# Patient Record
Sex: Female | Born: 1940 | Race: White | Hispanic: No | Smoking: Never smoker
Health system: Southern US, Community
[De-identification: ages and names within clinical notes are randomized; demographics above are authoritative.]

## PROBLEM LIST (undated history)

## (undated) DIAGNOSIS — Z8669 Personal history of other diseases of the nervous system and sense organs: Secondary | ICD-10-CM

## (undated) DIAGNOSIS — R233 Spontaneous ecchymoses: Secondary | ICD-10-CM

## (undated) DIAGNOSIS — I10 Essential (primary) hypertension: Secondary | ICD-10-CM

## (undated) DIAGNOSIS — I739 Peripheral vascular disease, unspecified: Secondary | ICD-10-CM

## (undated) DIAGNOSIS — Z7901 Long term (current) use of anticoagulants: Secondary | ICD-10-CM

## (undated) DIAGNOSIS — Z9889 Other specified postprocedural states: Secondary | ICD-10-CM

## (undated) DIAGNOSIS — O2230 Deep phlebothrombosis in pregnancy, unspecified trimester: Secondary | ICD-10-CM

## (undated) DIAGNOSIS — E7800 Pure hypercholesterolemia, unspecified: Secondary | ICD-10-CM

## (undated) DIAGNOSIS — Z86010 Personal history of colonic polyps: Secondary | ICD-10-CM

## (undated) DIAGNOSIS — K219 Gastro-esophageal reflux disease without esophagitis: Secondary | ICD-10-CM

## (undated) DIAGNOSIS — I82409 Acute embolism and thrombosis of unspecified deep veins of unspecified lower extremity: Secondary | ICD-10-CM

## (undated) DIAGNOSIS — M1712 Unilateral primary osteoarthritis, left knee: Secondary | ICD-10-CM

## (undated) DIAGNOSIS — R238 Other skin changes: Secondary | ICD-10-CM

## (undated) DIAGNOSIS — E7219 Other disorders of sulfur-bearing amino-acid metabolism: Principal | ICD-10-CM

## (undated) DIAGNOSIS — E538 Deficiency of other specified B group vitamins: Secondary | ICD-10-CM

## (undated) DIAGNOSIS — M1711 Unilateral primary osteoarthritis, right knee: Secondary | ICD-10-CM

## (undated) DIAGNOSIS — IMO0001 Reserved for inherently not codable concepts without codable children: Secondary | ICD-10-CM

## (undated) DIAGNOSIS — Z923 Personal history of irradiation: Secondary | ICD-10-CM

## (undated) DIAGNOSIS — K8020 Calculus of gallbladder without cholecystitis without obstruction: Secondary | ICD-10-CM

## (undated) DIAGNOSIS — R112 Nausea with vomiting, unspecified: Secondary | ICD-10-CM

## (undated) DIAGNOSIS — C55 Malignant neoplasm of uterus, part unspecified: Secondary | ICD-10-CM

## (undated) HISTORY — DX: Deficiency of other specified B group vitamins: E53.8

## (undated) HISTORY — DX: Calculus of gallbladder without cholecystitis without obstruction: K80.20

## (undated) HISTORY — DX: Unilateral primary osteoarthritis, right knee: M17.11

## (undated) HISTORY — PX: WISDOM TOOTH EXTRACTION: SHX21

## (undated) HISTORY — DX: Pure hypercholesterolemia, unspecified: E78.00

## (undated) HISTORY — DX: Deep phlebothrombosis in pregnancy, unspecified trimester: O22.30

## (undated) HISTORY — DX: Malignant neoplasm of uterus, part unspecified: C55

## (undated) HISTORY — PX: ABDOMINAL HYSTERECTOMY: SHX81

## (undated) HISTORY — DX: Essential (primary) hypertension: I10

## (undated) HISTORY — DX: Gastro-esophageal reflux disease without esophagitis: K21.9

## (undated) HISTORY — DX: Peripheral vascular disease, unspecified: I73.9

## (undated) HISTORY — DX: Other disorders of sulfur-bearing amino-acid metabolism: E72.19

---

## 1963-04-01 HISTORY — PX: APPENDECTOMY: SHX54

## 1985-03-31 HISTORY — PX: BREAST LUMPECTOMY: SHX2

## 1995-04-01 HISTORY — PX: ESOPHAGOGASTRODUODENOSCOPY: SHX1529

## 1995-04-01 HISTORY — PX: CHOLECYSTECTOMY: SHX55

## 1999-01-01 ENCOUNTER — Other Ambulatory Visit: Admission: RE | Admit: 1999-01-01 | Discharge: 1999-01-01 | Payer: Self-pay | Admitting: Obstetrics and Gynecology

## 1999-04-01 DIAGNOSIS — I82409 Acute embolism and thrombosis of unspecified deep veins of unspecified lower extremity: Secondary | ICD-10-CM

## 1999-04-01 DIAGNOSIS — Z7901 Long term (current) use of anticoagulants: Secondary | ICD-10-CM

## 1999-04-01 HISTORY — DX: Acute embolism and thrombosis of unspecified deep veins of unspecified lower extremity: I82.409

## 1999-04-01 HISTORY — DX: Long term (current) use of anticoagulants: Z79.01

## 1999-06-05 ENCOUNTER — Inpatient Hospital Stay (HOSPITAL_COMMUNITY): Admission: EM | Admit: 1999-06-05 | Discharge: 1999-06-07 | Payer: Self-pay | Admitting: Emergency Medicine

## 1999-06-05 ENCOUNTER — Ambulatory Visit (HOSPITAL_COMMUNITY): Admission: RE | Admit: 1999-06-05 | Discharge: 1999-06-05 | Payer: Self-pay | Admitting: Internal Medicine

## 1999-12-17 ENCOUNTER — Ambulatory Visit (HOSPITAL_COMMUNITY): Admission: RE | Admit: 1999-12-17 | Discharge: 1999-12-17 | Payer: Self-pay | Admitting: *Deleted

## 2000-07-07 ENCOUNTER — Emergency Department (HOSPITAL_COMMUNITY): Admission: EM | Admit: 2000-07-07 | Discharge: 2000-07-07 | Payer: Self-pay | Admitting: Emergency Medicine

## 2001-04-01 ENCOUNTER — Other Ambulatory Visit: Admission: RE | Admit: 2001-04-01 | Discharge: 2001-04-01 | Payer: Self-pay | Admitting: Obstetrics and Gynecology

## 2003-07-11 ENCOUNTER — Encounter (INDEPENDENT_AMBULATORY_CARE_PROVIDER_SITE_OTHER): Payer: Self-pay | Admitting: *Deleted

## 2004-02-01 ENCOUNTER — Ambulatory Visit: Payer: Self-pay | Admitting: Cardiology

## 2004-02-29 ENCOUNTER — Ambulatory Visit: Payer: Self-pay | Admitting: Internal Medicine

## 2004-03-18 ENCOUNTER — Ambulatory Visit: Payer: Self-pay | Admitting: Cardiovascular Disease

## 2004-04-15 ENCOUNTER — Ambulatory Visit: Payer: Self-pay | Admitting: *Deleted

## 2004-05-13 ENCOUNTER — Ambulatory Visit: Payer: Self-pay | Admitting: Cardiology

## 2004-05-21 ENCOUNTER — Ambulatory Visit: Payer: Self-pay | Admitting: Cardiology

## 2004-06-14 ENCOUNTER — Ambulatory Visit: Payer: Self-pay | Admitting: Cardiology

## 2004-07-05 ENCOUNTER — Ambulatory Visit: Payer: Self-pay | Admitting: Cardiology

## 2004-07-15 ENCOUNTER — Ambulatory Visit: Payer: Self-pay | Admitting: Internal Medicine

## 2004-08-07 ENCOUNTER — Ambulatory Visit: Payer: Self-pay | Admitting: *Deleted

## 2004-08-21 ENCOUNTER — Ambulatory Visit: Payer: Self-pay | Admitting: *Deleted

## 2004-08-29 ENCOUNTER — Ambulatory Visit: Payer: Self-pay | Admitting: Internal Medicine

## 2004-08-30 ENCOUNTER — Other Ambulatory Visit: Admission: RE | Admit: 2004-08-30 | Discharge: 2004-08-30 | Payer: Self-pay | Admitting: Family Medicine

## 2004-08-30 ENCOUNTER — Ambulatory Visit: Payer: Self-pay | Admitting: Family Medicine

## 2004-09-13 ENCOUNTER — Ambulatory Visit: Payer: Self-pay | Admitting: Cardiology

## 2004-09-26 ENCOUNTER — Ambulatory Visit: Payer: Self-pay | Admitting: Cardiology

## 2004-10-17 ENCOUNTER — Ambulatory Visit: Payer: Self-pay | Admitting: Cardiology

## 2004-11-14 ENCOUNTER — Ambulatory Visit: Payer: Self-pay | Admitting: Cardiology

## 2004-12-12 ENCOUNTER — Ambulatory Visit: Payer: Self-pay | Admitting: Internal Medicine

## 2004-12-23 ENCOUNTER — Ambulatory Visit: Payer: Self-pay | Admitting: Cardiology

## 2004-12-27 ENCOUNTER — Ambulatory Visit: Payer: Self-pay | Admitting: Internal Medicine

## 2005-01-06 ENCOUNTER — Ambulatory Visit: Payer: Self-pay | Admitting: Internal Medicine

## 2005-01-21 ENCOUNTER — Ambulatory Visit: Payer: Self-pay | Admitting: Cardiology

## 2005-02-05 ENCOUNTER — Ambulatory Visit: Payer: Self-pay | Admitting: Cardiology

## 2005-02-05 ENCOUNTER — Encounter: Admission: RE | Admit: 2005-02-05 | Discharge: 2005-03-30 | Payer: Self-pay | Admitting: Internal Medicine

## 2005-02-11 ENCOUNTER — Ambulatory Visit: Payer: Self-pay | Admitting: Internal Medicine

## 2005-02-19 ENCOUNTER — Ambulatory Visit: Payer: Self-pay | Admitting: Cardiology

## 2005-03-05 ENCOUNTER — Ambulatory Visit: Payer: Self-pay | Admitting: Cardiology

## 2005-03-26 ENCOUNTER — Ambulatory Visit: Payer: Self-pay | Admitting: Cardiology

## 2005-03-31 DIAGNOSIS — Z860100 Personal history of colon polyps, unspecified: Secondary | ICD-10-CM

## 2005-03-31 HISTORY — DX: Personal history of colon polyps, unspecified: Z86.0100

## 2005-03-31 HISTORY — DX: Personal history of colonic polyps: Z86.010

## 2005-03-31 HISTORY — PX: COLONOSCOPY W/ POLYPECTOMY: SHX1380

## 2005-04-23 ENCOUNTER — Ambulatory Visit: Payer: Self-pay | Admitting: Cardiology

## 2005-05-21 ENCOUNTER — Ambulatory Visit: Payer: Self-pay | Admitting: Cardiology

## 2005-06-18 ENCOUNTER — Ambulatory Visit: Payer: Self-pay | Admitting: Cardiology

## 2005-07-16 ENCOUNTER — Ambulatory Visit: Payer: Self-pay | Admitting: *Deleted

## 2005-08-13 ENCOUNTER — Ambulatory Visit: Payer: Self-pay | Admitting: Cardiology

## 2005-08-19 ENCOUNTER — Ambulatory Visit: Payer: Self-pay | Admitting: Internal Medicine

## 2005-09-10 ENCOUNTER — Ambulatory Visit: Payer: Self-pay | Admitting: Cardiology

## 2005-10-02 ENCOUNTER — Ambulatory Visit: Payer: Self-pay | Admitting: Internal Medicine

## 2005-10-08 ENCOUNTER — Ambulatory Visit: Payer: Self-pay | Admitting: Cardiovascular Disease

## 2005-10-22 ENCOUNTER — Ambulatory Visit: Payer: Self-pay | Admitting: Cardiology

## 2005-11-12 ENCOUNTER — Ambulatory Visit: Payer: Self-pay | Admitting: Cardiology

## 2005-11-25 ENCOUNTER — Ambulatory Visit: Payer: Self-pay | Admitting: Internal Medicine

## 2005-12-10 ENCOUNTER — Ambulatory Visit: Payer: Self-pay | Admitting: Internal Medicine

## 2005-12-15 ENCOUNTER — Encounter: Payer: Self-pay | Admitting: Internal Medicine

## 2005-12-18 ENCOUNTER — Ambulatory Visit: Payer: Self-pay | Admitting: Internal Medicine

## 2005-12-24 ENCOUNTER — Ambulatory Visit: Payer: Self-pay | Admitting: Cardiology

## 2005-12-25 ENCOUNTER — Ambulatory Visit: Payer: Self-pay | Admitting: Internal Medicine

## 2006-01-07 ENCOUNTER — Ambulatory Visit: Payer: Self-pay | Admitting: Internal Medicine

## 2006-01-09 ENCOUNTER — Ambulatory Visit: Payer: Self-pay | Admitting: Cardiology

## 2006-02-06 ENCOUNTER — Ambulatory Visit: Payer: Self-pay | Admitting: Cardiology

## 2006-03-06 ENCOUNTER — Ambulatory Visit: Payer: Self-pay | Admitting: Cardiology

## 2006-03-25 ENCOUNTER — Ambulatory Visit: Payer: Self-pay | Admitting: Cardiology

## 2006-04-15 ENCOUNTER — Ambulatory Visit: Payer: Self-pay | Admitting: Cardiology

## 2006-05-12 ENCOUNTER — Ambulatory Visit: Payer: Self-pay | Admitting: Cardiology

## 2006-06-02 ENCOUNTER — Ambulatory Visit: Payer: Self-pay | Admitting: Internal Medicine

## 2006-06-23 ENCOUNTER — Ambulatory Visit: Payer: Self-pay | Admitting: Cardiology

## 2006-07-21 ENCOUNTER — Ambulatory Visit: Payer: Self-pay | Admitting: Cardiology

## 2006-07-23 DIAGNOSIS — Z86718 Personal history of other venous thrombosis and embolism: Secondary | ICD-10-CM | POA: Insufficient documentation

## 2006-07-23 DIAGNOSIS — K219 Gastro-esophageal reflux disease without esophagitis: Secondary | ICD-10-CM | POA: Insufficient documentation

## 2006-08-18 ENCOUNTER — Ambulatory Visit: Payer: Self-pay | Admitting: Cardiology

## 2006-09-03 ENCOUNTER — Ambulatory Visit: Payer: Self-pay | Admitting: Cardiology

## 2006-10-01 ENCOUNTER — Ambulatory Visit: Payer: Self-pay | Admitting: Internal Medicine

## 2006-10-19 ENCOUNTER — Ambulatory Visit: Payer: Self-pay | Admitting: Cardiology

## 2006-11-09 ENCOUNTER — Ambulatory Visit: Payer: Self-pay | Admitting: Internal Medicine

## 2006-11-24 ENCOUNTER — Ambulatory Visit: Payer: Self-pay | Admitting: Cardiology

## 2006-12-08 ENCOUNTER — Ambulatory Visit: Payer: Self-pay | Admitting: Cardiology

## 2006-12-29 ENCOUNTER — Ambulatory Visit: Payer: Self-pay | Admitting: Cardiology

## 2007-01-19 ENCOUNTER — Ambulatory Visit: Payer: Self-pay | Admitting: Cardiovascular Disease

## 2007-02-02 ENCOUNTER — Ambulatory Visit: Payer: Self-pay | Admitting: Cardiology

## 2007-02-23 ENCOUNTER — Ambulatory Visit: Payer: Self-pay | Admitting: Internal Medicine

## 2007-03-01 ENCOUNTER — Encounter: Payer: Self-pay | Admitting: Internal Medicine

## 2007-03-09 ENCOUNTER — Ambulatory Visit: Payer: Self-pay | Admitting: Cardiology

## 2007-03-18 ENCOUNTER — Ambulatory Visit: Payer: Self-pay | Admitting: Internal Medicine

## 2007-03-18 DIAGNOSIS — E8881 Metabolic syndrome: Secondary | ICD-10-CM | POA: Insufficient documentation

## 2007-03-18 DIAGNOSIS — I10 Essential (primary) hypertension: Secondary | ICD-10-CM | POA: Insufficient documentation

## 2007-03-18 DIAGNOSIS — E782 Mixed hyperlipidemia: Secondary | ICD-10-CM | POA: Insufficient documentation

## 2007-03-18 LAB — CONVERTED CEMR LAB
Cholesterol, target level: 200 mg/dL
HDL goal, serum: 40 mg/dL
LDL Goal: 160 mg/dL
Vit D, 1,25-Dihydroxy: 13 — ABNORMAL LOW (ref 30–89)

## 2007-03-29 ENCOUNTER — Encounter (INDEPENDENT_AMBULATORY_CARE_PROVIDER_SITE_OTHER): Payer: Self-pay | Admitting: *Deleted

## 2007-03-29 LAB — CONVERTED CEMR LAB
ALT: 16 units/L (ref 0–35)
AST: 20 units/L (ref 0–37)
Albumin: 3.9 g/dL (ref 3.5–5.2)
Alkaline Phosphatase: 96 units/L (ref 39–117)
BUN: 8 mg/dL (ref 6–23)
Bilirubin, Direct: 0.1 mg/dL (ref 0.0–0.3)
Cholesterol: 135 mg/dL (ref 0–200)
Creatinine, Ser: 0.9 mg/dL (ref 0.4–1.2)
HDL: 60.9 mg/dL (ref >39.0)
Hgb A1c MFr Bld: 5.4 % (ref 4.6–6.0)
LDL Cholesterol: 52 mg/dL (ref 0–99)
Potassium: 4.3 meq/L (ref 3.5–5.1)
Total Bilirubin: 0.8 mg/dL (ref 0.3–1.2)
Total CHOL/HDL Ratio: 2.2
Total Protein: 7.5 g/dL (ref 6.0–8.3)
Triglycerides: 111 mg/dL (ref 0–149)
VLDL: 22 mg/dL (ref 0–40)

## 2007-03-31 ENCOUNTER — Ambulatory Visit: Payer: Self-pay | Admitting: Cardiology

## 2007-04-02 ENCOUNTER — Telehealth (INDEPENDENT_AMBULATORY_CARE_PROVIDER_SITE_OTHER): Payer: Self-pay | Admitting: *Deleted

## 2007-04-15 ENCOUNTER — Ambulatory Visit: Payer: Self-pay | Admitting: Gastroenterology

## 2007-04-21 ENCOUNTER — Encounter: Payer: Self-pay | Admitting: Internal Medicine

## 2007-04-21 ENCOUNTER — Encounter: Payer: Self-pay | Admitting: Gastroenterology

## 2007-04-21 ENCOUNTER — Ambulatory Visit: Payer: Self-pay | Admitting: Gastroenterology

## 2007-04-30 ENCOUNTER — Ambulatory Visit: Payer: Self-pay | Admitting: Cardiology

## 2007-05-21 ENCOUNTER — Ambulatory Visit: Payer: Self-pay | Admitting: Cardiology

## 2007-06-18 ENCOUNTER — Ambulatory Visit: Payer: Self-pay | Admitting: Cardiology

## 2007-07-14 ENCOUNTER — Ambulatory Visit: Payer: Self-pay | Admitting: Internal Medicine

## 2007-08-04 ENCOUNTER — Ambulatory Visit: Payer: Self-pay | Admitting: Cardiology

## 2007-08-17 ENCOUNTER — Ambulatory Visit: Payer: Self-pay | Admitting: Cardiology

## 2007-08-18 ENCOUNTER — Ambulatory Visit: Payer: Self-pay | Admitting: Internal Medicine

## 2007-08-18 LAB — CONVERTED CEMR LAB
Cholesterol: 169 mg/dL (ref 0–200)
HDL: 58.5 mg/dL (ref >39.0)
LDL Cholesterol: 80 mg/dL (ref 0–99)
Total CHOL/HDL Ratio: 2.9
Triglycerides: 155 mg/dL — ABNORMAL HIGH (ref 0–149)
VLDL: 31 mg/dL (ref 0–40)

## 2007-08-19 ENCOUNTER — Encounter: Payer: Self-pay | Admitting: Internal Medicine

## 2007-08-20 ENCOUNTER — Encounter (INDEPENDENT_AMBULATORY_CARE_PROVIDER_SITE_OTHER): Payer: Self-pay | Admitting: *Deleted

## 2007-08-20 LAB — CONVERTED CEMR LAB: Vit D, 1,25-Dihydroxy: 26 — ABNORMAL LOW (ref 30–89)

## 2007-08-25 ENCOUNTER — Ambulatory Visit: Payer: Self-pay | Admitting: Internal Medicine

## 2007-08-25 DIAGNOSIS — E559 Vitamin D deficiency, unspecified: Secondary | ICD-10-CM | POA: Insufficient documentation

## 2007-08-25 LAB — CONVERTED CEMR LAB
Cholesterol, target level: 200 mg/dL
HDL goal, serum: 50 mg/dL
LDL Goal: 100 mg/dL

## 2007-09-07 ENCOUNTER — Ambulatory Visit: Payer: Self-pay | Admitting: Cardiovascular Disease

## 2007-10-05 ENCOUNTER — Ambulatory Visit: Payer: Self-pay | Admitting: Cardiovascular Disease

## 2007-10-14 ENCOUNTER — Other Ambulatory Visit: Admission: RE | Admit: 2007-10-14 | Discharge: 2007-10-14 | Payer: Self-pay | Admitting: Family Medicine

## 2007-10-14 ENCOUNTER — Ambulatory Visit: Payer: Self-pay | Admitting: Family Medicine

## 2007-10-14 ENCOUNTER — Encounter: Payer: Self-pay | Admitting: Family Medicine

## 2007-10-19 ENCOUNTER — Ambulatory Visit: Payer: Self-pay | Admitting: Cardiology

## 2007-10-20 ENCOUNTER — Encounter (INDEPENDENT_AMBULATORY_CARE_PROVIDER_SITE_OTHER): Payer: Self-pay | Admitting: *Deleted

## 2007-11-02 ENCOUNTER — Ambulatory Visit: Payer: Self-pay | Admitting: Cardiology

## 2007-11-15 ENCOUNTER — Ambulatory Visit: Payer: Self-pay | Admitting: Internal Medicine

## 2007-11-15 LAB — CONVERTED CEMR LAB
OCCULT 1: NEGATIVE
OCCULT 2: NEGATIVE
OCCULT 3: NEGATIVE

## 2007-11-16 ENCOUNTER — Encounter (INDEPENDENT_AMBULATORY_CARE_PROVIDER_SITE_OTHER): Payer: Self-pay | Admitting: *Deleted

## 2007-11-23 ENCOUNTER — Ambulatory Visit: Payer: Self-pay | Admitting: Cardiology

## 2007-12-21 ENCOUNTER — Ambulatory Visit: Payer: Self-pay | Admitting: Cardiology

## 2007-12-31 ENCOUNTER — Ambulatory Visit: Payer: Self-pay | Admitting: Cardiology

## 2007-12-31 ENCOUNTER — Encounter: Payer: Self-pay | Admitting: Internal Medicine

## 2008-01-14 ENCOUNTER — Ambulatory Visit: Payer: Self-pay | Admitting: Cardiology

## 2008-02-03 ENCOUNTER — Ambulatory Visit: Payer: Self-pay | Admitting: Internal Medicine

## 2008-02-03 DIAGNOSIS — N62 Hypertrophy of breast: Secondary | ICD-10-CM | POA: Insufficient documentation

## 2008-02-04 ENCOUNTER — Ambulatory Visit: Payer: Self-pay | Admitting: Cardiology

## 2008-02-15 ENCOUNTER — Encounter: Payer: Self-pay | Admitting: Internal Medicine

## 2008-02-17 LAB — CONVERTED CEMR LAB: Vit D, 1,25-Dihydroxy: 27 — ABNORMAL LOW (ref 30–89)

## 2008-02-22 ENCOUNTER — Ambulatory Visit: Payer: Self-pay | Admitting: Internal Medicine

## 2008-02-22 ENCOUNTER — Encounter (INDEPENDENT_AMBULATORY_CARE_PROVIDER_SITE_OTHER): Payer: Self-pay | Admitting: *Deleted

## 2008-02-22 LAB — CONVERTED CEMR LAB
OCCULT 1: NEGATIVE
OCCULT 2: NEGATIVE
OCCULT 3: NEGATIVE

## 2008-02-25 ENCOUNTER — Telehealth (INDEPENDENT_AMBULATORY_CARE_PROVIDER_SITE_OTHER): Payer: Self-pay | Admitting: *Deleted

## 2008-02-25 ENCOUNTER — Encounter (INDEPENDENT_AMBULATORY_CARE_PROVIDER_SITE_OTHER): Payer: Self-pay | Admitting: *Deleted

## 2008-02-28 ENCOUNTER — Telehealth: Payer: Self-pay | Admitting: Family Medicine

## 2008-02-28 ENCOUNTER — Ambulatory Visit: Payer: Self-pay | Admitting: Cardiovascular Disease

## 2008-03-13 ENCOUNTER — Ambulatory Visit: Payer: Self-pay | Admitting: Internal Medicine

## 2008-03-20 ENCOUNTER — Telehealth (INDEPENDENT_AMBULATORY_CARE_PROVIDER_SITE_OTHER): Payer: Self-pay | Admitting: *Deleted

## 2008-03-20 ENCOUNTER — Ambulatory Visit: Payer: Self-pay | Admitting: Internal Medicine

## 2008-03-20 DIAGNOSIS — Z860100 Personal history of colon polyps, unspecified: Secondary | ICD-10-CM | POA: Insufficient documentation

## 2008-03-20 LAB — CONVERTED CEMR LAB: HDL goal, serum: 40 mg/dL

## 2008-03-22 ENCOUNTER — Encounter (INDEPENDENT_AMBULATORY_CARE_PROVIDER_SITE_OTHER): Payer: Self-pay | Admitting: *Deleted

## 2008-03-22 LAB — CONVERTED CEMR LAB: Vit D, 1,25-Dihydroxy: 42 (ref 30–89)

## 2008-03-27 ENCOUNTER — Telehealth (INDEPENDENT_AMBULATORY_CARE_PROVIDER_SITE_OTHER): Payer: Self-pay | Admitting: *Deleted

## 2008-04-03 ENCOUNTER — Ambulatory Visit: Payer: Self-pay | Admitting: Cardiology

## 2008-05-04 ENCOUNTER — Ambulatory Visit: Payer: Self-pay | Admitting: Internal Medicine

## 2008-05-08 ENCOUNTER — Telehealth (INDEPENDENT_AMBULATORY_CARE_PROVIDER_SITE_OTHER): Payer: Self-pay | Admitting: *Deleted

## 2008-06-02 ENCOUNTER — Ambulatory Visit: Payer: Self-pay | Admitting: Internal Medicine

## 2008-06-28 ENCOUNTER — Ambulatory Visit: Payer: Self-pay | Admitting: Cardiology

## 2008-07-18 ENCOUNTER — Ambulatory Visit: Payer: Self-pay | Admitting: Internal Medicine

## 2008-07-20 ENCOUNTER — Encounter (INDEPENDENT_AMBULATORY_CARE_PROVIDER_SITE_OTHER): Payer: Self-pay | Admitting: *Deleted

## 2008-07-20 LAB — CONVERTED CEMR LAB: Vit D, 25-Hydroxy: 32 ng/mL (ref 30–89)

## 2008-07-24 ENCOUNTER — Telehealth (INDEPENDENT_AMBULATORY_CARE_PROVIDER_SITE_OTHER): Payer: Self-pay | Admitting: *Deleted

## 2008-07-26 ENCOUNTER — Ambulatory Visit: Payer: Self-pay | Admitting: Cardiology

## 2008-08-16 ENCOUNTER — Ambulatory Visit: Payer: Self-pay | Admitting: Internal Medicine

## 2008-08-17 ENCOUNTER — Ambulatory Visit: Payer: Self-pay | Admitting: Internal Medicine

## 2008-08-24 LAB — CONVERTED CEMR LAB
ALT: 16 units/L (ref 0–35)
AST: 22 units/L (ref 0–37)
Albumin: 3.7 g/dL (ref 3.5–5.2)
Alkaline Phosphatase: 87 units/L (ref 39–117)
Bilirubin, Direct: 0.1 mg/dL (ref 0.0–0.3)
Cholesterol: 159 mg/dL (ref 0–200)
HDL: 55.2 mg/dL (ref >39.00)
Hgb A1c MFr Bld: 5.5 % (ref 4.6–6.5)
LDL Cholesterol: 65 mg/dL (ref 0–99)
Total Bilirubin: 0.5 mg/dL (ref 0.3–1.2)
Total CHOL/HDL Ratio: 3
Total Protein: 7.2 g/dL (ref 6.0–8.3)
Triglycerides: 195 mg/dL — ABNORMAL HIGH (ref 0.0–149.0)
VLDL: 39 mg/dL (ref 0.0–40.0)

## 2008-08-25 ENCOUNTER — Encounter (INDEPENDENT_AMBULATORY_CARE_PROVIDER_SITE_OTHER): Payer: Self-pay | Admitting: *Deleted

## 2008-08-29 ENCOUNTER — Encounter: Payer: Self-pay | Admitting: *Deleted

## 2008-09-14 ENCOUNTER — Ambulatory Visit: Payer: Self-pay | Admitting: Internal Medicine

## 2008-09-14 LAB — CONVERTED CEMR LAB
POC INR: 1.7
Protime: 16.2

## 2008-10-04 ENCOUNTER — Encounter: Payer: Self-pay | Admitting: *Deleted

## 2008-10-05 ENCOUNTER — Ambulatory Visit: Payer: Self-pay | Admitting: Internal Medicine

## 2008-10-05 LAB — CONVERTED CEMR LAB
POC INR: 2.5
Prothrombin Time: 19.2 s

## 2008-11-02 ENCOUNTER — Ambulatory Visit: Payer: Self-pay | Admitting: Cardiology

## 2008-11-02 LAB — CONVERTED CEMR LAB
POC INR: 2
Prothrombin Time: 17.5 s

## 2008-11-30 ENCOUNTER — Ambulatory Visit: Payer: Self-pay | Admitting: Internal Medicine

## 2008-11-30 LAB — CONVERTED CEMR LAB: POC INR: 3.3

## 2008-12-28 ENCOUNTER — Ambulatory Visit: Payer: Self-pay | Admitting: Internal Medicine

## 2008-12-28 LAB — CONVERTED CEMR LAB: POC INR: 2.9

## 2009-01-24 ENCOUNTER — Ambulatory Visit: Payer: Self-pay | Admitting: Cardiology

## 2009-01-24 LAB — CONVERTED CEMR LAB: POC INR: 2.5

## 2009-02-16 ENCOUNTER — Encounter: Payer: Self-pay | Admitting: Internal Medicine

## 2009-02-20 ENCOUNTER — Ambulatory Visit: Payer: Self-pay | Admitting: Cardiovascular Disease

## 2009-02-20 LAB — CONVERTED CEMR LAB: POC INR: 3.2

## 2009-02-27 ENCOUNTER — Ambulatory Visit: Payer: Self-pay | Admitting: Internal Medicine

## 2009-03-15 LAB — CONVERTED CEMR LAB
Cholesterol: 167 mg/dL (ref 0–200)
HDL: 59.8 mg/dL (ref >39.00)
LDL Cholesterol: 82 mg/dL (ref 0–99)
Total CHOL/HDL Ratio: 3
Triglycerides: 124 mg/dL (ref 0.0–149.0)
VLDL: 24.8 mg/dL (ref 0.0–40.0)

## 2009-03-16 ENCOUNTER — Encounter (INDEPENDENT_AMBULATORY_CARE_PROVIDER_SITE_OTHER): Payer: Self-pay | Admitting: *Deleted

## 2009-03-16 ENCOUNTER — Telehealth (INDEPENDENT_AMBULATORY_CARE_PROVIDER_SITE_OTHER): Payer: Self-pay | Admitting: *Deleted

## 2009-03-19 ENCOUNTER — Ambulatory Visit: Payer: Self-pay | Admitting: Cardiology

## 2009-03-19 LAB — CONVERTED CEMR LAB: POC INR: 2.5

## 2009-03-20 ENCOUNTER — Ambulatory Visit: Payer: Self-pay | Admitting: Internal Medicine

## 2009-03-21 ENCOUNTER — Encounter: Payer: Self-pay | Admitting: Internal Medicine

## 2009-03-26 ENCOUNTER — Encounter (INDEPENDENT_AMBULATORY_CARE_PROVIDER_SITE_OTHER): Payer: Self-pay | Admitting: *Deleted

## 2009-03-26 LAB — CONVERTED CEMR LAB
ALT: 15 units/L (ref 0–35)
AST: 19 units/L (ref 0–37)
Albumin: 4.3 g/dL (ref 3.5–5.2)
Alkaline Phosphatase: 89 units/L (ref 39–117)
BUN: 9 mg/dL (ref 6–23)
Basophils Absolute: 0 10*3/uL (ref 0.0–0.1)
Basophils Relative: 0.2 % (ref 0.0–3.0)
Bilirubin, Direct: 0 mg/dL (ref 0.0–0.3)
CO2: 29 meq/L (ref 19–32)
Calcium: 9.8 mg/dL (ref 8.4–10.5)
Chloride: 102 meq/L (ref 96–112)
Creatinine, Ser: 0.7 mg/dL (ref 0.4–1.2)
Eosinophils Absolute: 0.1 10*3/uL (ref 0.0–0.7)
Eosinophils Relative: 1.6 % (ref 0.0–5.0)
GFR calc non Af Amer: 88.56 mL/min (ref >60)
Glucose, Bld: 85 mg/dL (ref 70–99)
HCT: 45.3 % (ref 36.0–46.0)
Hemoglobin: 14.9 g/dL (ref 12.0–15.0)
Lymphocytes Relative: 35.8 % (ref 12.0–46.0)
Lymphs Abs: 2.1 10*3/uL (ref 0.7–4.0)
MCHC: 32.9 g/dL (ref 30.0–36.0)
MCV: 89.6 fL (ref 78.0–100.0)
Monocytes Absolute: 0.4 10*3/uL (ref 0.1–1.0)
Monocytes Relative: 6.2 % (ref 3.0–12.0)
Neutro Abs: 3.4 10*3/uL (ref 1.4–7.7)
Neutrophils Relative %: 56.2 % (ref 43.0–77.0)
Platelets: 186 10*3/uL (ref 150.0–400.0)
Potassium: 4.2 meq/L (ref 3.5–5.1)
RBC: 5.05 M/uL (ref 3.87–5.11)
RDW: 12.5 % (ref 11.5–14.6)
Sodium: 140 meq/L (ref 135–145)
TSH: 1.8 microintl units/mL (ref 0.35–5.50)
Total Bilirubin: 1 mg/dL (ref 0.3–1.2)
Total Protein: 8 g/dL (ref 6.0–8.3)
Vit D, 25-Hydroxy: 39 ng/mL (ref 30–89)
WBC: 6 10*3/uL (ref 4.5–10.5)

## 2009-03-29 ENCOUNTER — Telehealth (INDEPENDENT_AMBULATORY_CARE_PROVIDER_SITE_OTHER): Payer: Self-pay | Admitting: *Deleted

## 2009-04-11 ENCOUNTER — Ambulatory Visit: Payer: Self-pay | Admitting: Internal Medicine

## 2009-04-11 ENCOUNTER — Encounter (INDEPENDENT_AMBULATORY_CARE_PROVIDER_SITE_OTHER): Payer: Self-pay | Admitting: *Deleted

## 2009-04-11 LAB — CONVERTED CEMR LAB
OCCULT 1: NEGATIVE
OCCULT 2: NEGATIVE
OCCULT 3: NEGATIVE

## 2009-04-16 ENCOUNTER — Ambulatory Visit: Payer: Self-pay | Admitting: Cardiology

## 2009-04-16 LAB — CONVERTED CEMR LAB: POC INR: 2.6

## 2009-05-14 ENCOUNTER — Encounter (INDEPENDENT_AMBULATORY_CARE_PROVIDER_SITE_OTHER): Payer: Self-pay | Admitting: Cardiology

## 2009-05-14 ENCOUNTER — Ambulatory Visit: Payer: Self-pay | Admitting: Cardiology

## 2009-05-14 LAB — CONVERTED CEMR LAB: POC INR: 3.6

## 2009-06-01 ENCOUNTER — Telehealth: Payer: Self-pay | Admitting: Cardiology

## 2009-06-11 ENCOUNTER — Ambulatory Visit: Payer: Self-pay | Admitting: Internal Medicine

## 2009-06-11 LAB — CONVERTED CEMR LAB: POC INR: 2.6

## 2009-06-12 ENCOUNTER — Ambulatory Visit: Payer: Self-pay | Admitting: Internal Medicine

## 2009-07-09 ENCOUNTER — Ambulatory Visit: Payer: Self-pay | Admitting: Cardiovascular Disease

## 2009-07-09 LAB — CONVERTED CEMR LAB: POC INR: 2.2

## 2009-08-06 ENCOUNTER — Ambulatory Visit: Payer: Self-pay | Admitting: Cardiology

## 2009-08-06 LAB — CONVERTED CEMR LAB: POC INR: 2.7

## 2009-09-03 ENCOUNTER — Ambulatory Visit: Payer: Self-pay | Admitting: Internal Medicine

## 2009-09-03 LAB — CONVERTED CEMR LAB: POC INR: 2.3

## 2009-09-25 ENCOUNTER — Ambulatory Visit: Payer: Self-pay | Admitting: Internal Medicine

## 2009-09-26 LAB — CONVERTED CEMR LAB: Vit D, 25-Hydroxy: 36 ng/mL (ref 30–89)

## 2009-10-02 ENCOUNTER — Ambulatory Visit: Payer: Self-pay | Admitting: Internal Medicine

## 2009-10-02 LAB — CONVERTED CEMR LAB: POC INR: 1.8

## 2009-10-23 ENCOUNTER — Ambulatory Visit: Payer: Self-pay | Admitting: Cardiology

## 2009-10-23 LAB — CONVERTED CEMR LAB: POC INR: 1.9

## 2009-11-13 ENCOUNTER — Ambulatory Visit: Payer: Self-pay | Admitting: Internal Medicine

## 2009-11-13 LAB — CONVERTED CEMR LAB: POC INR: 2.5

## 2009-12-11 ENCOUNTER — Ambulatory Visit: Payer: Self-pay | Admitting: Cardiovascular Disease

## 2009-12-11 LAB — CONVERTED CEMR LAB: POC INR: 2.9

## 2010-01-07 ENCOUNTER — Ambulatory Visit: Payer: Self-pay | Admitting: Cardiology

## 2010-01-07 LAB — CONVERTED CEMR LAB: POC INR: 3.3

## 2010-02-04 ENCOUNTER — Ambulatory Visit: Payer: Self-pay | Admitting: Cardiovascular Disease

## 2010-02-04 LAB — CONVERTED CEMR LAB: POC INR: 3.8

## 2010-02-15 ENCOUNTER — Ambulatory Visit: Payer: Self-pay | Admitting: Cardiology

## 2010-02-15 LAB — CONVERTED CEMR LAB: POC INR: 2.2

## 2010-02-18 ENCOUNTER — Encounter: Payer: Self-pay | Admitting: Internal Medicine

## 2010-03-08 ENCOUNTER — Ambulatory Visit: Payer: Self-pay | Admitting: Internal Medicine

## 2010-03-08 LAB — CONVERTED CEMR LAB: POC INR: 2.1

## 2010-03-21 ENCOUNTER — Ambulatory Visit: Payer: Self-pay | Admitting: Internal Medicine

## 2010-03-21 ENCOUNTER — Telehealth (INDEPENDENT_AMBULATORY_CARE_PROVIDER_SITE_OTHER): Payer: Self-pay | Admitting: *Deleted

## 2010-03-21 ENCOUNTER — Encounter: Payer: Self-pay | Admitting: Internal Medicine

## 2010-03-21 DIAGNOSIS — M810 Age-related osteoporosis without current pathological fracture: Secondary | ICD-10-CM | POA: Insufficient documentation

## 2010-03-22 LAB — CONVERTED CEMR LAB
ALT: 15 units/L (ref 0–35)
AST: 24 units/L (ref 0–37)
Albumin: 4.1 g/dL (ref 3.5–5.2)
Alkaline Phosphatase: 99 units/L (ref 39–117)
BUN: 8 mg/dL (ref 6–23)
Basophils Absolute: 0 10*3/uL (ref 0.0–0.1)
Basophils Relative: 0.3 % (ref 0.0–3.0)
Bilirubin, Direct: 0.2 mg/dL (ref 0.0–0.3)
CO2: 29 meq/L (ref 19–32)
Calcium: 9.1 mg/dL (ref 8.4–10.5)
Chloride: 101 meq/L (ref 96–112)
Cholesterol: 155 mg/dL (ref 0–200)
Creatinine, Ser: 0.7 mg/dL (ref 0.4–1.2)
Eosinophils Absolute: 0.1 10*3/uL (ref 0.0–0.7)
Eosinophils Relative: 1.5 % (ref 0.0–5.0)
GFR calc non Af Amer: 86.86 mL/min (ref >60.00)
Glucose, Bld: 77 mg/dL (ref 70–99)
HCT: 42.9 % (ref 36.0–46.0)
HDL: 66.2 mg/dL (ref >39.00)
Hemoglobin: 14.6 g/dL (ref 12.0–15.0)
LDL Cholesterol: 66 mg/dL (ref 0–99)
Lymphocytes Relative: 37.5 % (ref 12.0–46.0)
Lymphs Abs: 2.4 10*3/uL (ref 0.7–4.0)
MCHC: 34 g/dL (ref 30.0–36.0)
MCV: 88.1 fL (ref 78.0–100.0)
Monocytes Absolute: 0.4 10*3/uL (ref 0.1–1.0)
Monocytes Relative: 6.4 % (ref 3.0–12.0)
Neutro Abs: 3.5 10*3/uL (ref 1.4–7.7)
Neutrophils Relative %: 54.3 % (ref 43.0–77.0)
Platelets: 228 10*3/uL (ref 150.0–400.0)
Potassium: 3.7 meq/L (ref 3.5–5.1)
RBC: 4.87 M/uL (ref 3.87–5.11)
RDW: 13.8 % (ref 11.5–14.6)
Sodium: 140 meq/L (ref 135–145)
TSH: 1.6 microintl units/mL (ref 0.35–5.50)
Total Bilirubin: 0.8 mg/dL (ref 0.3–1.2)
Total CHOL/HDL Ratio: 2
Total Protein: 7.5 g/dL (ref 6.0–8.3)
Triglycerides: 114 mg/dL (ref 0.0–149.0)
VLDL: 22.8 mg/dL (ref 0.0–40.0)
Vit D, 25-Hydroxy: 48 ng/mL (ref 30–89)
WBC: 6.5 10*3/uL (ref 4.5–10.5)

## 2010-03-31 HISTORY — PX: DILATION AND CURETTAGE OF UTERUS: SHX78

## 2010-04-05 ENCOUNTER — Ambulatory Visit: Admission: RE | Admit: 2010-04-05 | Discharge: 2010-04-05 | Payer: Self-pay | Source: Home / Self Care

## 2010-04-05 LAB — CONVERTED CEMR LAB: POC INR: 2.9

## 2010-04-21 ENCOUNTER — Encounter: Payer: Self-pay | Admitting: Gastroenterology

## 2010-05-01 DIAGNOSIS — Z7901 Long term (current) use of anticoagulants: Secondary | ICD-10-CM

## 2010-05-01 DIAGNOSIS — Z86718 Personal history of other venous thrombosis and embolism: Secondary | ICD-10-CM

## 2010-05-02 NOTE — Medication Information (Signed)
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 no    Recent/future dental: no  Any missed doses?: no       Is patient compliant with meds? yes       Current Medications (verified): 1)  Lotrel 5-10 Mg  Caps (Amlodipine Besy-Benazepril Hcl) .Marland Kitchen.. 1 By Mouth Qd 2)  Warfarin Sodium 5 Mg  Tabs (Warfarin Sodium) .... Take As Directed Clinic 3)  Vitamin D [address] Unit  Caps (Ergocalciferol) .... 2 By Mouth Weekly 4)  Omeprazole 20 Mg Tbec (Omeprazole) .Marland Kitchen.. 1 Pill 30 Min Pre B'fast & Eve Meal 5)  Metoprolol Tartrate 50 Mg Tabs (Metoprolol Tartrate) .... 1/2 Once Daily 6)  Vytorin 10-20 Mg Tabs (Ezetimibe-Simvastatin) .... 1/2 By Mouth Once Daily 7)  Vitamin D3 1000 Unit Caps (Cholecalciferol) .Marland Kitchen.. 1 By Mouth Once Daily  Allergies (verified): 1)  ! Asa 2)  ! * Lipitor 3)  ! * Hctz  Anticoagulation Management History:      The patient is taking warfarin and comes in today for a routine follow up visit.  Positive risk factors for bleeding include an age of 6 years or older.  Negative risk factors for bleeding include no history of CVA/TIA.  The bleeding index is 'intermediate risk'.  Positive CHADS2 values include History of HTN.  Negative CHADS2 values include Age > 21 years old, History of Diabetes, and Prior Stroke/CVA/TIA.  The start date was 06/04/1999.  Anticoagulation responsible provider: Tenny Craw MD, Gunnar Fusi.  INR POC: 3.3.  Cuvette Lot#: [id number].  Exp: 12/2009.    Anticoagulation Management Assessment/Plan:      The patient's current anticoagulation dose is Warfarin sodium 5 mg  tabs: take  as directed CLINIC.  The target INR is 2 - 3.  The next INR is due 12/28/2008.  Anticoagulation instructions were given to patient.  Results were reviewed/authorized by Cloyde Reams, RN, BSN .  She was notified by Cloyde Reams RN.         Prior Anticoagulation Instructions: INR 2.0  Continue on same dosage 1 tablet daily except 1/2 tablet on Wednesdays.  Recheck in 4 weeks.    Current Anticoagulation Instructions: INR 3.3   Skip today's dose of coumadin then resume 1 tablet daily except 1/2 tablet on Wednesdays.  Recheck in

## 2010-05-02 NOTE — Assessment & Plan Note (Signed)
Summary: PAP ONLY//VGJ   Vital Signs:  Patient Profile:   70 Years Old Female Weight:      179 pounds Temp:     97.7 degrees F oral Pulse rate:   60 / minute Resp:     16 per minute BP sitting:   100 / 68  (right arm)  Vitals Entered By: Ardyth Man (October 14, 2007 7:56 AM)                 PCP:  Laury Axon  Chief Complaint:  Dr. Frederik Pear patient and PAP Only.  History of Present Illness: Pt here for Pap and breast exam only.  Dr Alwyn Ren has done the cpe.  No complaints.    Current Allergies: ! ASA ! * LIPITOR ! 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       negative clotting studies       lumpectomy       upper endo 1997       epistaxix (ER) 2003       Upper Endo 1977   Family History:    Reviewed history from 03/18/2007 and no changes required:       father leukemia died 18       maternal first cousin diabetes       mother htn, dvt with ankle fracture       maternal grandmother colon cancer    Review of Systems      See HPI   Physical Exam  General:     Well-developed,well-nourished,in no acute distress; alert,appropriate and cooperative throughout examination Breasts:     No mass, nodules, thickening, tenderness, bulging, retraction, inflamation, nipple discharge or skin changes noted.   Rectal:     No external abnormalities noted. Normal sphincter tone. No rectal masses or tenderness.  Genitalia:     Pelvic Exam:        External: normal female genitalia without lesions or masses        Vagina: normal without lesions or masses        Cervix: normal without lesions or masses        Adnexa: normal bimanual exam without masses  or fullness        Uterus: normal by palpation        Pap smear: performed Neurologic:     alert & oriented X3, cranial nerves II-XII intact, and strength normal in all extremities.   Skin:     Intact without suspicious lesions or rashes Psych:     Cognition and judgment appear intact. Alert and cooperative with normal attention span and concentration. No apparent delusions, illusions, hallucinations    Impression & Recommendations:  Problem # 1:  ROUTINE GYNECOLOGICAL EXAMINATION (ICD-V72.31) pap done Mammo and bmd utd per [facility] RTO 2 years for pap rectal was heme + for blood but colon just done.  ? if from pap although no blood seen on exam---check stool cards.  Complete Medication List: 1)  Atenolol 50  Mg Tabs (Atenolol) .... 1/2 tab qd 2)  Lotrel 5-10 Mg Caps (Amlodipine besy-benazepril hcl) .Marland Kitchen.. 1 by mouth qd 3)  Vytorin 10-20 Mg Tabs (Ezetimibe-simvastatin) .... 1/2 by mouth once daily 4)  Fosamax 70 Mg Tabs (Alendronate sodium) .Marland Kitchen.. 1 by mouth qweek 5)  Warfarin Sodium 5 Mg Tabs (Warfarin sodium) .... Take as directed clinic 6)  Vitamin D [address] Unit Caps (Ergocalciferol) .Marland Kitchen.. 1 pill weekly    ]

## 2010-05-02 NOTE — Medication Information (Signed)
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 no    Recent/future dental: no  Any missed doses?: no       Is patient compliant with meds? yes       Allergies: 1)  ! Asa 2)  ! * Lipitor 3)  ! * Hctz  Anticoagulation Management History:      The patient is taking warfarin and comes in today for a routine follow up visit.  Positive risk factors for bleeding include an age of 69 years or older.  Negative risk factors for bleeding include no history of CVA/TIA.  The bleeding index is 'intermediate risk'.  Positive CHADS2 values include History of HTN.  Negative CHADS2 values include Age > 28 years old, History of Diabetes, and Prior Stroke/CVA/TIA.  The start date was 06/04/1999.  Anticoagulation responsible provider: Tenny Craw MD, Gunnar Fusi.  INR POC: 2.3.  Cuvette Lot#: [id number].  Exp: 10/2010.    Anticoagulation Management Assessment/Plan:      The patient's current anticoagulation dose is Warfarin sodium 5 mg  tabs: take as directed CLINIC.  The target INR is 2 - 3.  The next INR is due 10/02/2009.  Anticoagulation instructions were given to patient.  Results were reviewed/authorized by Bethena Midget, RN, BSN.  She was notified by Bethena Midget, RN, BSN.         Prior Anticoagulation Instructions: INR 2.7  Continue same dose of 1 tablet every day except 1/2 tablet on Wednesday and Saturday   Current Anticoagulation Instructions: INR 2.3 Continue 5mg s everyday except 2.5mg s on Wednesdays and Saturdays. Recheck in 4 weeks.

## 2010-05-02 NOTE — Letter (Signed)
Summary: Results Follow up Letter  [place] [facility]  [address] [place], [place] [address]   Phone: [phone]  Fax: [phone]    03/16/2009 MRN: [id number]  Bonnie Zhang [address] [place], [place]  [address]  Dear Ms. Rayle,  The following are the results of your recent test(s):  Test         Result    Pap Smear:        Normal _____  Not Normal _____ Comments: ______________________________________________________ Cholesterol: LDL(Bad cholesterol):         Your goal is less than:         HDL (Good cholesterol):       Your goal is more than: Comments:  ______________________________________________________ Mammogram:        Normal _____  Not Normal _____ Comments:  ___________________________________________________________________ Hemoccult:        Normal _____  Not normal _______ Comments:    _____________________________________________________________________ Other Tests: Please see attached labs done on 02/27/2009    We routinely do not discuss normal results over the telephone.  If you desire a copy of the results, or you have any questions about this information we can discuss them at your next office visit.   Sincerely,

## 2010-05-02 NOTE — Medication Information (Signed)
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 no    Recent/future dental: no  Any missed doses?: no       Is patient compliant with meds? yes       Allergies: 1)  ! Asa 2)  ! * Lipitor 3)  ! * Hctz  Anticoagulation Management History:      The patient is taking warfarin and comes in today for a routine follow up visit.  Positive risk factors for bleeding include an age of 75 years or older.  Negative risk factors for bleeding include no history of CVA/TIA.  The bleeding index is 'intermediate risk'.  Positive CHADS2 values include History of HTN.  Negative CHADS2 values include Age > 16 years old, History of Diabetes, and Prior Stroke/CVA/TIA.  The start date was 06/04/1999.  Anticoagulation responsible provider: Juanda Chance MD, Smitty Cords.  INR POC: 1.9.  Cuvette Lot#: [id number].  Exp: 12/2010.    Anticoagulation Management Assessment/Plan:      The patient's current anticoagulation dose is Warfarin sodium 5 mg  tabs: take as directed CLINIC.  The target INR is 2 - 3.  The next INR is due 11/13/2009.  Anticoagulation instructions were given to patient.  Results were reviewed/authorized by Bonnie Midget, RN, BSN.  She was notified by Bonnie Midget, RN, BSN.         Prior Anticoagulation Instructions: INR 1.8  Take 1.5 tablets today, then resume same dosage 1 tablet daily except 1/2 tablet on Wednesdays and Saturdays.  Recheck in 3 weeks.    Current Anticoagulation Instructions: INR 1.9 Today take 1.5 tablets then change dose to 1 tablet everyday except 1/2 tablet on Wednesdays. Recheck in 3  weeks.

## 2010-05-02 NOTE — Medication Information (Signed)
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 no    Recent/future dental: no  Any missed doses?: no       Is patient compliant with meds? yes       Current Medications (verified): 1)  Lotrel 5-10 Mg  Caps (Amlodipine Besy-Benazepril Hcl) .Marland Kitchen.. 1 By Mouth Qd 2)  Warfarin Sodium 5 Mg  Tabs (Warfarin Sodium) .... Take As Directed Clinic 3)  Vitamin D [address] Unit  Caps (Ergocalciferol) .... 2 By Mouth Weekly 4)  Omeprazole 20 Mg Tbec (Omeprazole) .Marland Kitchen.. 1 Pill 30 Min Pre B'fast & Eve Meal 5)  Metoprolol Tartrate 50 Mg Tabs (Metoprolol Tartrate) .... 1/2 Once Daily 6)  Vytorin 10-20 Mg Tabs (Ezetimibe-Simvastatin) .... 1/2 By Mouth Once Daily 7)  Vitamin D3 1000 Unit Caps (Cholecalciferol) .Marland Kitchen.. 1 By Mouth Once Daily  Allergies (verified): 1)  ! Asa 2)  ! * Lipitor 3)  ! * Hctz  Anticoagulation Management History:      The patient is taking warfarin and comes in today for a routine follow up visit.  Positive risk factors for bleeding include an age of 69 years or older.  Negative risk factors for bleeding include no history of CVA/TIA.  The bleeding index is 'intermediate risk'.  Positive CHADS2 values include History of HTN.  Negative CHADS2 values include Age > 69 years old, History of Diabetes, and Prior Stroke/CVA/TIA.  The start date was 06/04/1999.  Anticoagulation responsible provider: Antoine Poche MD, Fayrene Fearing.  INR POC: 2.5.  Cuvette Lot#: [id number].  Exp: 05/2010.    Anticoagulation Management Assessment/Plan:      The patient's current anticoagulation dose is Warfarin sodium 5 mg  tabs:  take as directed CLINIC.  The target INR is 2 - 3.  The next INR is due 04/16/2009.  Anticoagulation instructions were given to patient.  Results were reviewed/authorized by Cloyde Reams, RN, BSN.  She was notified by Cloyde Reams RN.         Prior Anticoagulation Instructions: INR: 3.2  Take 1/2 tablet today instead of a whole, then continue with the current dosing regimen of 1 tablet daily except 1/2 tablet on Wednesdays.  Recheck 4 weeks.  Current Anticoagulation Instructions: INR 2.5  Continue on same dosage 1 tablet daily except 1/2 tablet on Wednesdays.  Recheck in 4 weeks.

## 2010-05-02 NOTE — Medication Information (Signed)
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 no    Recent/future dental: no  Any missed doses?: no       Is patient compliant with meds? yes       Allergies: 1)  ! Asa 2)  ! * Lipitor 3)  ! * Hctz  Anticoagulation Management History:      The patient is taking warfarin and comes in today for a routine follow up visit.  Positive risk factors for bleeding include an age of 70 years or older.  Negative risk factors for bleeding include no history of CVA/TIA.  The bleeding index is 'intermediate risk'.  Positive CHADS2 values include History of HTN.  Negative CHADS2 values include Age > 1 years old, History of Diabetes, and Prior Stroke/CVA/TIA.  The start date was 06/04/1999.  Anticoagulation responsible provider: Tenny Craw MD, Gunnar Fusi.  INR POC: 2.9.  Cuvette Lot#: [id number].  Exp: 05/2011.    Anticoagulation Management Assessment/Plan:      The patient's current anticoagulation dose is Warfarin sodium 5 mg  tabs: take as directed CLINIC.  The target INR is 2 - 3.  The next INR is due 05/03/2010.  Anticoagulation instructions were given to patient.  Results were reviewed/authorized by Weston Brass, PharmD.  She was notified by Stephannie Peters, PharmD Candidate .         Prior Anticoagulation Instructions: INR 2.1  Continue same dose of 1 tablet every day except 1/2 tablet on Wednesday and Saturday.  Recheck INR in 4 weeks.   Current Anticoagulation Instructions: INR 2.9  Coumadin 5 mg tablets - Take 1 tablet every day except 1/2 tablet on Wednesdays and Saturdays

## 2010-05-02 NOTE — Progress Notes (Signed)
Summary: Refill Request  Phone Note Refill Request Message from:  Patient on March 21, 2010 1:42 PM  Refills Requested: Medication #1:  METOPROLOL TARTRATE 50 MG TABS 1/2 once daily   Dosage confirmed as above?Dosage Confirmed   Supply Requested: 3 months   Last Refilled: 03/20/2009 [facility] [address]  Next Appointment Scheduled: today Initial call taken by: Harold Barban,  March 21, 2010 1:42 PM    Prescriptions: METOPROLOL TARTRATE 50 MG TABS (METOPROLOL TARTRATE) 1/2 once daily  #45 x 3   Entered by:   Shonna Chock CMA   Authorized by:   Marga Melnick MD   Signed by:   Shonna Chock CMA on 03/21/2010   Method used:   Electronically to        [facility] (retail)       [address]       [place], [place]  [address]       Ph: [phone]       Fax: [phone]   RxID:   [id number]

## 2010-05-02 NOTE — Letter (Signed)
Summary: Results Follow up Letter  [place] [facility]  [address] [place], [place] [address]   Phone: [phone]  Fax: [phone]    03/26/2009 MRN: [id number]  Bonnie Zhang [address] [place], [place]  [address]  Dear [name],  The following are the results of your recent test(s):  Test         Result    Pap Smear:        Normal _____  Not Normal _____ Comments: ______________________________________________________ Cholesterol: LDL(Bad cholesterol):         Your goal is less than:         HDL (Good cholesterol):       Your goal is more than: Comments:  ______________________________________________________ Mammogram:        Normal _____  Not Normal _____ Comments:  ___________________________________________________________________ Hemoccult:        Normal _____  Not normal _______ Comments:    _____________________________________________________________________ Other Tests: Please see attached labs done on 03/20/2009, call to [facility] lab appointment to recheck in 6 months    We routinely do not discuss normal results over the telephone.  If you desire a copy of the results, or you have any questions about this information we can discuss them at your next office visit.   Sincerely,

## 2010-05-02 NOTE — Progress Notes (Signed)
Summary: MEDICATION CONCERNS  Phone Note Call from Patient Call back at Home Phone [phone]   Caller: Patient Summary of Call: MESSAGE LEFT ON VOICEMAIL: MEDICATION CONCERNS, PLEASE CALL   SPOKE WITH PATIENT- PATIENT SAID @ HER LAST OV SHE SAID SHE WOULD LIKE VYTORIN CHANGED DUE TO INSURANCE, MED WAS CHANGED TO PRAVASTATIN. PATIENT READ UP ON PRAVASTATIN AND WOULD PREFER TO STAY ON VYTORIN-VYTORIN WILL BE A LITTLE EXPENSIVE BUT SHE IS WILLING TO PAY. PATIENT NEVER STOPPED VYTORIN CAUSE SHE WAS TOLD TO CONTINUE UNTIL CURRENT RX USED. PATIENT WOULD LIKE TO LET DR.HOPPER KNOW SHE WILL JUST CONTINUE VYTORIN.  PATIENT NEEDS RX SENT TO [facility] (90 DAY SUPPLY)-PATIENT NOT DUE TO RECHECK LABS UNTIL 07/2008 Initial call taken by: Shonna Chock,  May 08, 2008 3:10 PM  Follow-up for Phone Call        PATIENT AWARE RX SENT. NOTE PRINTED AND PLACED ON LEDGE FOR DR.HOPPER TO REVIEW AND SO THAT HE IS AWARE OF WHATS GOING ON. Follow-up by: Shonna Chock,  May 08, 2008 3:20 PM    New/Updated Medications: VYTORIN 10-20 MG TABS (EZETIMIBE-SIMVASTATIN) 1/2 by mouth once daily   Prescriptions: VYTORIN 10-20 MG TABS (EZETIMIBE-SIMVASTATIN) 1/2 by mouth once daily  #45 x 1   Entered by:   Shonna Chock   Authorized by:   Marga Melnick MD   Signed by:   Shonna Chock on 05/08/2008   Method used:   Electronically to        [facility] (retail)       [address]       [place], [place]  [address]       Ph: [phone]       Fax: [phone]   RxID:   [id number]

## 2010-05-02 NOTE — Medication Information (Signed)
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 no    Recent/future dental: no  Any missed doses?: no       Is patient compliant with meds? yes       Allergies: 1)  ! Asa 2)  ! * Lipitor 3)  ! * Hctz  Anticoagulation Management History:      The patient is taking warfarin and comes in today for a routine follow up visit.  Positive risk factors for bleeding include an age of 69 years or older.  Negative risk factors for bleeding include no history of CVA/TIA.  The bleeding index is 'intermediate risk'.  Positive CHADS2 values include History of HTN.  Negative CHADS2 values include Age > 101 years old, History of Diabetes, and Prior Stroke/CVA/TIA.  The start date was 06/04/1999.  Anticoagulation responsible provider: Antoine Poche MD, Fayrene Fearing.  INR POC: 2.2.  Cuvette Lot#: [id number].  Exp: 01/2011.    Anticoagulation Management Assessment/Plan:      The patient's current anticoagulation dose is Warfarin sodium 5 mg  tabs: take as directed CLINIC.  The target INR is 2 - 3.  The next INR is due 03/08/2010.  Anticoagulation instructions were given to patient.  Results were reviewed/authorized by Lyna Poser, PharmD.         Prior Anticoagulation Instructions: INR 3.8 Skip your dose today. Change your saturday dose to a half tablet. Take a half tablet on wednesday and saturdays. And 1 tablet all other days. Recheck in 1 week.   Current Anticoagulation Instructions: INR 2.2 Continue taking a half tablet on wednesday and saturday. And 1 tablet all  other days. Recheck in 3 weeks.

## 2010-05-02 NOTE — Medication Information (Signed)
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 no    Recent/future dental: no  Any missed doses?: no       Is patient compliant with meds? yes       Allergies (verified): 1)  ! Asa 2)  ! * Lipitor 3)  ! * Hctz  Anticoagulation Management History:      The patient is taking warfarin and comes in today for a routine follow up visit.  Positive risk factors for bleeding include an age of 83 years or older.  Negative risk factors for bleeding include no history of CVA/TIA.  The bleeding index is 'intermediate risk'.  Positive CHADS2 values include History of HTN.  Negative CHADS2 values include Age > 70 years old, History of Diabetes, and Prior Stroke/CVA/TIA.  The start date was 06/04/1999.  Anticoagulation responsible provider: Shirlee Latch MD, [place].  INR POC: 2.5.  Cuvette Lot#: [id number].  Exp: 12/2009.    Anticoagulation Management Assessment/Plan:      The patient's current anticoagulation dose is Warfarin sodium 5 mg  tabs: take as directed CLINIC.  The target INR is 2 - 3.  The next INR is due 02/20/2009.  Anticoagulation instructions were given to patient.  Results were reviewed/authorized by Weston Brass, PharmD.  She was notified by Marcheta Grammes, PharmD candidate.         Prior Anticoagulation Instructions: INR 2.9  Continue on same dosage 1 tablet daily except 1/2 tablet on Wednesdays.  Recheck in 4 weeks.    Current Anticoagulation Instructions: INR 2.5  Take half a tablet (2.5 mg) every Wednesday. Take 1 tablet (5 mg) all the other days of the week.

## 2010-05-02 NOTE — Letter (Signed)
Summary: Results Follow up Letter  [place] at [facility]  [address] [place], [place] [address]   Phone: [phone]  Fax: [phone]    07/20/2008 MRN: [id number]  Bonnie Zhang [address] [place], [place]  [address]  Dear Ms. Santalucia,  The following are the results of your recent test(s):  Test         Result    Pap Smear:        Normal _____  Not Normal _____ Comments: ______________________________________________________ Cholesterol: LDL(Bad cholesterol):         Your goal is less than:         HDL (Good cholesterol):       Your goal is more than: Comments:  ______________________________________________________ Mammogram:        Normal _____  Not Normal _____ Comments:  ___________________________________________________________________ Hemoccult:        Normal _____  Not normal _______ Comments:    _____________________________________________________________________ Other Tests: PLEASE SEE ATTACHED LABS DONE ON 07/18/2008    We routinely do not discuss normal results over the telephone.  If you desire a copy of the results, or you have any questions about this information we can discuss them at your next office visit.   Sincerely,

## 2010-05-02 NOTE — Progress Notes (Signed)
Summary: refill**Please calll pt once sent**   Phone Note Refill Request Message from:  Patient on June 01, 2009 9:03 AM  Refills Requested: Medication #1:  WARFARIN SODIUM 5 MG  TABS take as directed CLINIC   Supply Requested: 3 months [facility] [address]   Method Requested: Fax to Local Pharmacy Initial call taken by: Migdalia Dk,  June 01, 2009 9:03 AM    Prescriptions: WARFARIN SODIUM 5 MG  TABS (WARFARIN SODIUM) take as directed CLINIC  #90 x 1   Entered by:   Cloyde Reams RN   Authorized by:   Rollene Rotunda, MD, [facility]   Signed by:   Cloyde Reams RN on 06/01/2009   Method used:   Electronically to        [facility] (retail)       [address]       [place], [place]  [address]       Ph: [phone]       Fax: [phone]   RxID:   [id number]

## 2010-05-02 NOTE — Letter (Signed)
Summary: Results Follow-up Letter  [place] at [facility]  [address] [place], [place] [address]   Phone: [phone]  Fax: [phone]    03/29/2007        Clovis Cao. Winch [address] [place], [place]  [address]  Dear Ms. Grimes,   The following are the results of your recent test(s):  Test     Result     Pap Smear    Normal_______  Not Normal_____       Comments: _________________________________________________________ Cholesterol LDL(Bad cholesterol):          Your goal is less than:         HDL (Good cholesterol):        Your goal is more than: _________________________________________________________ Other Tests:   _________________________________________________________  Please call for an appointment Or _________________________________________________________ _________________________________________________________ _________________________________________________________  Sincerely,  Ardyth Man [place] at [facility]

## 2010-05-02 NOTE — Progress Notes (Signed)
Summary: VIT-D CONCERNS  Phone Note Call from Patient Call back at Home Phone [phone]   Caller: Patient Summary of Call: PATIENT TAKING VIT D 50,000 2 X WEEKLY. VIT-D IS @ 42 RANGE 30-89. PATIENT SAID SHOULD SHE CONTINUE TAKING 2 X WEEKLY OR CAN SHE GO BACK TO 1 X WEEKLY OR SHOULD SHE STOP PRESCRIED VIT D ALL TOGETHER. Initial call taken by: Shonna Chock,  March 27, 2008 8:57 AM  Follow-up for Phone Call        PER DR.HOPPER GO BACK TO 1 by mouth WEEKLY RECHECK LEVEL IN 4 MONTHS. D/W PATIENT. Follow-up by: Shonna Chock,  March 27, 2008 1:15 PM

## 2010-05-02 NOTE — Assessment & Plan Note (Signed)
Summary: acute indigestion.cbs   Vital Signs:  Patient Profile:   70 Years Old Female Weight:      181.0 pounds Temp:     97.7 degrees F oral Pulse rate:   72 / minute BP sitting:   116 / 78  (left arm) Cuff size:   large  Vitals Entered By: Shonna Chock (February 03, 2008 12:19 PM)                 PCP:  Laury Axon  Chief Complaint:  INDIGESTION-ONGOING CONCERN and CHECK LEFT BREAST-BIGGER THAN THE OTHER AND A LITTLE SORENESS. DISCUSS FOSAMAX -STOPPED X 1 MONTH.  History of Present Illness: Dyspepsia X 1 month ; present most of time. Better  temporarily with TUMs.She had  similar episode 5 yrs ago which resolved with Nexium. Endo in 1997 was neg except for ERD; cholecystectomy then. 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 Coagulopathy studies neg 2001. L breast larger than R X 1  month. No other symptoms  Dyspepsia History:      She has no alarm features of dyspepsia including no history of melena, hematochezia, dysphagia, persistent vomiting, or involuntary weight loss > 5%.  There is a prior history of GERD.  She notes that it has been less than 12 months since the last episode of GERD.  The patient does not have a prior history of documented ulcer disease.  The dominant symptom is heartburn or acid reflux.  An H-2 blocker medication is not currently being taken.  She has no history of a positive H. Pylori serology.  A prior EGD has been done which showed no evidence for moderate or severe esophagitis or Barrett's esophagus.       Current Allergies (reviewed today): ! ASA ! * LIPITOR ! * HCTZ  Past Medical History:    DVT, hx of X2 ; negative coagulopathy panel    GERD    Osteopenia    epistaxis in ER 2003  Past Surgical History:    c-section times three with appendectomy    gallbladder 1997    DVT 1965 & 2001    negative clotting studies    lumpectomy 1987    upper endo 1997    epistaxix (ER) 2003    Upper Endo  1977     Review of Systems  General      Denies chills, fever, sweats, and weight loss.  ENT      Denies difficulty swallowing and hoarseness.  CV      Complains of swelling of feet.      Denies chest pain or discomfort, leg cramps with exertion, and shortness of breath with exertion.      LLE edema during day since DVT  Resp      Denies chest pain with inspiration, coughing up blood, pleuritic, and shortness of breath.  GI      Denies abdominal pain and gas.  Endo      No galactorrhea or pain   Physical Exam  General:     well-nourished,in no acute distress; alert,appropriate and cooperative throughout examination Eyes:     No corneal or conjunctival inflammation noted.Perrla. No icterus Mouth:     Oral mucosa and oropharynx without lesions or exudates.  Teeth in good repair. pharynx pink and moist.   Breasts:     No mass, nodules, thickening, tenderness, bulging, retraction, inflamation, nipple discharge or skin changes noted.   L larger than  R Lungs:     Normal respiratory effort, chest expands symmetrically. Lungs are clear to auscultation, no crackles or wheezes. Heart:     Normal rate and regular rhythm. S1 and S2 normal without gallop, murmur, click, rub or other extra sounds. Abdomen:     Bowel sounds positive,abdomen soft and non-tender without masses, organomegaly or hernias noted. Infraumbilical op scar Rectal:     Given stool cards Pulses:     R and L dorsalis pedis and posterior tibial pulses are full and equal bilaterally Extremities:     trace left pedal edema.  Neg Homan's bilat Neurologic:     alert & oriented X3.   Skin:     Intact without suspicious lesions or rashes. No jaundice Cervical Nodes:     No lymphadenopathy noted Axillary Nodes:     No palpable lymphadenopathy Psych:     memory intact for recent and remote, normally interactive, good eye contact, and not anxious appearing.      Impression & Recommendations:  Problem # 1:   GERD (ICD-530.81)  Orders: EKG w/ Interpretation (93[address])  Her updated medication list for this problem includes:    Omeprazole 20 Mg Tbec (Omeprazole) .Marland Kitchen... 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 1/2 tab qd 2)  Lotrel 5-10 Mg Caps (Amlodipine besy-benazepril hcl) .Marland Kitchen.. 1 by mouth qd 3)  Vytorin 10-20 Mg Tabs (Ezetimibe-simvastatin) .... 1/2 by mouth once daily 4)  Warfarin Sodium 5 Mg Tabs (Warfarin sodium) .... Take as directed clinic 5)  Vitamin D [address] Unit Caps (Ergocalciferol) .Marland Kitchen.. 1 pill weekly 6)  Omeprazole 20 Mg Tbec (Omeprazole) .Marland Kitchen.. 1 pill 30 min pre b'fast & eve meal   Patient Instructions: 1)  Avoid foods high in acid (tomatoes, citrus juices, spicy foods). Avoid eating within two hours of lying down or before exercising. Do not over eat; try smaller more frequent meals. Elevate head of bed twelve inches when sleeping. Complete stool cards   Prescriptions: OMEPRAZOLE 20 MG TBEC (OMEPRAZOLE) 1 pill 30 min pre b'fast & eve meal  #60 x 1   Entered and Authorized by:   Marga Melnick MD   Signed by:   Marga Melnick MD on 02/03/2008   Method used:   Print then Give to Patient   RxID:   [id number]  ]

## 2010-05-02 NOTE — Medication Information (Signed)
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 no    Recent/future dental: no  Any missed doses?: no       Is patient compliant with meds? yes       Current Medications (verified): 1)  Lotrel 5-10 Mg  Caps (Amlodipine Besy-Benazepril Hcl) .Marland Kitchen.. 1 By Mouth Qd 2)  Warfarin Sodium 5 Mg  Tabs (Warfarin Sodium) .... Take As Directed Clinic 3)  Vitamin D 94854 Unit  Caps (Ergocalciferol) .... 2 By Mouth Weekly 4)  Omeprazole 20 Mg Tbec (Omeprazole) .Marland Kitchen.. 1 Pill 30 Min Pre B'fast & Eve Meal 5)  Metoprolol Tartrate 50 Mg Tabs (Metoprolol Tartrate) .... 1/2 Once Daily 6)  Vytorin 10-20 Mg Tabs (Ezetimibe-Simvastatin) .... 1/2 By Mouth Once Daily 7)  Vitamin D3 1000 Unit Caps (Cholecalciferol) .Marland Kitchen.. 1 By Mouth Once Daily  Allergies (verified): 1)  ! Asa 2)  ! * Lipitor 3)  ! * Hctz  Anticoagulation Management History:      The patient is taking warfarin and comes in today for a routine follow up visit.  Positive risk factors for bleeding include an age of 59 years or older.  Negative risk factors for bleeding include no history of CVA/TIA.  The bleeding index is 'intermediate risk'.  Positive CHADS2 values include History of HTN.  Negative CHADS2 values include Age > 21 years old, History of Diabetes, and Prior Stroke/CVA/TIA.  The start date was 06/04/1999.  Anticoagulation responsible provider: Graciela Husbands MD, Viviann Spare.  INR POC: 2.9.  Cuvette Lot#: [id number].  Exp: 01/2010.    Anticoagulation Management Assessment/Plan:      The patient's current anticoagulation dose is Warfarin sodium 5 mg  tabs:  take as directed CLINIC.  The target INR is 2 - 3.  The next INR is due 01/25/2009.  Anticoagulation instructions were given to patient.  Results were reviewed/authorized by Cloyde Reams, RN, BSN .  She was notified by Cloyde Reams RN.         Prior Anticoagulation Instructions: INR 3.3   Skip today's dose of coumadin then resume 1 tablet daily except 1/2 tablet on Wednesdays.  Recheck in    Current Anticoagulation Instructions: INR 2.9  Continue on same dosage 1 tablet daily except 1/2 tablet on Wednesdays.  Recheck in 4 weeks.

## 2010-05-02 NOTE — Medication Information (Signed)
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 no    Recent/future dental: no  Any missed doses?: no       Is patient compliant with meds? yes       Current Medications (verified): 1)  Lotrel 5-10 Mg  Caps (Amlodipine Besy-Benazepril Hcl) .Marland Kitchen.. 1 By Mouth Qd 2)  Warfarin Sodium 5 Mg  Tabs (Warfarin Sodium) .... Take As Directed Clinic 3)  Vitamin D 81191 Unit  Caps (Ergocalciferol) .... 2 By Mouth Weekly 4)  Omeprazole 20 Mg Tbec (Omeprazole) .Marland Kitchen.. 1 Pill 30 Min Pre B'fast & Eve Meal 5)  Metoprolol Tartrate 50 Mg Tabs (Metoprolol Tartrate) .... 1/2 Once Daily 6)  Vytorin 10-20 Mg Tabs (Ezetimibe-Simvastatin) .... 1/2 By Mouth Once Daily 7)  Vitamin D3 1000 Unit Caps (Cholecalciferol) .Marland Kitchen.. 1 By Mouth Once Daily  Allergies (verified): 1)  ! Asa 2)  ! * Lipitor 3)  ! * Hctz  Anticoagulation Management History:      The patient is taking warfarin and comes in today for a routine follow up visit.  Positive risk factors for bleeding include an age of 63 years or older.  Negative risk factors for bleeding include no history of CVA/TIA.  The bleeding index is 'intermediate risk'.  Positive CHADS2 values include History of HTN.  Negative CHADS2 values include Age > 2 years old, History of Diabetes, and Prior Stroke/CVA/TIA.  The start date was 06/04/1999.  Prothrombin time is 17.5.  Anticoagulation responsible Josette Shimabukuro: Riley Kill MD, Maisie Fus.  INR POC: 2.0.  Cuvette Lot#: [id number].  Exp: 09/2009.    Anticoagulation Management Assessment/Plan:      The patient's current anticoagulation  dose is Warfarin sodium 5 mg  tabs: take as directed CLINIC.  The target INR is 2 - 3.  The next INR is due 11/30/2008.  Anticoagulation instructions were given to patient.  Results were reviewed/authorized by Cloyde Reams, RN, BSN .  She was notified by Cloyde Reams RN.         Prior Anticoagulation Instructions: INR 1.7  Take 1 and 1/2 tablet today then resume same dose of 5mg  everyday with 2.5mg  on Wednesday   Current Anticoagulation Instructions: INR 2.0  Continue on same dosage 1 tablet daily except 1/2 tablet on Wednesdays.  Recheck in 4 weeks.

## 2010-05-02 NOTE — Letter (Signed)
Summary: Results Follow up Letter  [place] at [facility]  [address] [place], [place] [address]   Phone: [phone]  Fax: [phone]    11/16/2007 MRN: [id number]  Bonnie Zhang [address] [place], [place]  [address]  Dear Ms. Bosso,  The following are the results of your recent test(s):  Test         Result    Pap Smear:        Normal _____  Not Normal _____ Comments: ______________________________________________________ Cholesterol: LDL(Bad cholesterol):         Your goal is less than:         HDL (Good cholesterol):       Your goal is more than: Comments:  ______________________________________________________ Mammogram:        Normal _____  Not Normal _____ Comments:  ___________________________________________________________________ Hemoccult:        Normal __X___  Not normal _______ Comments:    _____________________________________________________________________ Other Tests:    We routinely do not discuss normal results over the telephone.  If you desire a copy of the results, or you have any questions about this information we can discuss them at your next office visit.   Sincerely,

## 2010-05-02 NOTE — Medication Information (Signed)
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 no    Recent/future dental: no  Any missed doses?: no       Is patient compliant with meds? yes       Allergies: 1)  ! Asa 2)  ! * Lipitor 3)  ! * Hctz  Anticoagulation Management History:      The patient is taking warfarin and comes in today for a routine follow up visit.  Positive risk factors for bleeding include an age of 40 years or older.  Negative risk factors for bleeding include no history of CVA/TIA.  The bleeding index is 'intermediate risk'.  Positive CHADS2 values include History of HTN.  Negative CHADS2 values include Age > 10 years old, History of Diabetes, and Prior Stroke/CVA/TIA.  The start date was 06/04/1999.  Anticoagulation responsible provider: Shirlee Latch MD, Flynn Lininger.  INR POC: 3.6.  Cuvette Lot#: [id number].  Exp: 06/2010.    Anticoagulation Management Assessment/Plan:      The patient's current anticoagulation dose is Warfarin sodium 5 mg  tabs: take as directed CLINIC.  The target INR is 2 - 3.  The next INR is due 06/11/2009.  Anticoagulation instructions were given to patient.  Results were reviewed/authorized by Shelby Dubin, PharmD, BCPS, CPP.  She was notified by Shelby Dubin PharmD, BCPS, CPP.         Prior Anticoagulation Instructions: INR 2.6  Continue on same dosage 1 tablet daily except 1/2 tablet on Wednesdays. Recheck in 4 weeks.    Current Anticoagulation Instructions: Skip 1 day then 1 tab daily except Wednesday and Saturday take 0.5 tab.   INR 3.6  Recheck in 3/14 at 930 am.

## 2010-05-02 NOTE — Assessment & Plan Note (Signed)
Summary: BLD PRESSURE RUNNING HIGH/RH......   Vital Signs:  Patient profile:   70 year old female Weight:      181 pounds Temp:     98.5 degrees F oral Pulse rate:   62 / minute Resp:     17 per minute BP sitting:   100 / 60  (left arm)  Vitals Entered By: Jeremy Johann CMA (June 12, 2009 2:16 PM) CC: f/u elevated BP, Hypertension Management Comments REVIEWED MED LIST, PATIENT AGREED DOSE AND INSTRUCTION CORRECT    Primary Care Provider:  Laury Axon  CC:  f/u elevated BP and Hypertension Management.  History of Present Illness: BP high @ home; 142/69-156/70 with arm cuff. Repeat BP was 130/70.  Hypertension History:      She complains of peripheral edema, but denies headache, chest pain, palpitations, dyspnea with exertion, orthopnea, PND, visual symptoms, neurologic problems, syncope, and side effects from treatment.  She notes no problems with any antihypertensive medication side effects.  Chronic edema LLE after standing ; PMH of DVT X 2.        Positive major cardiovascular risk factors include female age 81 years old or older, hyperlipidemia, and hypertension.  Negative major cardiovascular risk factors include no history of diabetes, negative family history for ischemic heart disease, and non-tobacco-user status.        Positive history for target organ damage include peripheral vascular disease.  Further assessment for target organ damage reveals no history of ASHD or stroke/TIA.     Allergies: 1)  ! Asa 2)  ! * Lipitor 3)  ! * Hctz  Review of Systems Eyes:  Denies blurring, double vision, and vision loss-both eyes. Neuro:  Denies disturbances in coordination, numbness, poor balance, and tingling.  Physical Exam  General:   alert,appropriate and cooperative throughout examination Lungs:  Normal respiratory effort, chest expands symmetrically. Lungs are clear to auscultation, no crackles or wheezes. Heart:  Normal rate and regular rhythm. S1 and S2 normal without  gallop, murmur, click, rub . S4 Pulses:  R and L carotid,radial,dorsalis pedis and posterior tibial pulses are full and equal bilaterally Extremities:  trace left pedal edema.     Impression & Recommendations:  Problem # 1:  HYPERTENSION, ESSENTIAL NOS (ICD-401.9)  Her updated medication list for this problem includes:    Lotrel 5-10 Mg Caps (Amlodipine besy-benazepril hcl) .Marland Kitchen... 1 by mouth qd    Metoprolol Tartrate 50 Mg Tabs (Metoprolol tartrate) .Marland Kitchen... 1/2 once daily  Complete Medication List: 1)  Lotrel 5-10 Mg Caps (Amlodipine besy-benazepril hcl) .Marland Kitchen.. 1 by mouth qd 2)  Warfarin Sodium 5 Mg Tabs (Warfarin sodium) .... Take as directed clinic 3)  Vitamin D 82956 Unit Caps (Ergocalciferol) .Marland Kitchen.. 1 by mouth weekly 4)  Omeprazole 20 Mg Tbec (Omeprazole) .Marland Kitchen.. 1 pill 30 min pre b'fast & eve meal as needed 5)  Metoprolol Tartrate 50 Mg Tabs (Metoprolol tartrate) .... 1/2 once daily 6)  Vytorin 10-20 Mg Tabs (Ezetimibe-simvastatin) .... 1/2 by mouth once daily 7)  Vitamin D3 50,000 Unit Caps (cholecalciferol)  .Marland Kitchen.. 1 by mouth once every other week plus vit d 1000 international units once daily  Hypertension Assessment/Plan:      The patient's hypertensive risk group is category C: Target organ damage and/or diabetes.  Her calculated 10 year risk of coronary heart disease is 4 %.  Today's blood pressure is 100/60.    Patient Instructions: 1)  Check your Blood Pressure regularly. If it is above: 135/85 ON AVERAGE  you should make  an appointment. Check your cuff @ all MD appts & against drug store

## 2010-05-02 NOTE — Assessment & Plan Note (Signed)
Summary: med refill - cbs   Vital Signs:  Patient Profile:   70 Years Old Female Weight:      183 pounds Pulse rate:   60 / minute Resp:     16 per minute BP sitting:   110 / 80  Vitals Entered By: Kandice Hams (March 20, 2008 2:40 PM)                 PCP:  Laury Axon  Chief Complaint:  med refill.  History of Present Illness: In 2010 Vytorin will cost $79/ month.BP not checked @ home. Vit D 50,000 International Units 2 pills /week x 6 weeks.  Hypertension History:      She complains of peripheral edema, but denies headache, chest pain, palpitations, dyspnea with exertion, orthopnea, PND, visual symptoms, neurologic problems, syncope, and side effects from treatment.  She notes no problems with any antihypertensive medication side effects.  Further comments include: Edema after prolonged standing.        Positive major cardiovascular risk factors include female age 28 years old or older, hyperlipidemia, and hypertension.  Negative major cardiovascular risk factors include no history of diabetes, negative family history for ischemic heart disease, and non-tobacco-user status.        Positive history for target organ damage include peripheral vascular disease.  Further assessment for target organ damage reveals no history of ASHD or stroke/TIA.    Lipid Management History:      Positive NCEP/ATP III risk factors include female age 49 years old or older, hypertension, and peripheral vascular disease.  Negative NCEP/ATP III risk factors include no history of early menopause without estrogen hormone replacement, non-diabetic, no family history for ischemic heart disease, non-tobacco-user status, no ASHD (atherosclerotic heart disease), no prior stroke/TIA, and no history of aortic aneurysm.        Current Allergies: ! ASA ! * LIPITOR ! 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  CV      Denies bluish discoloration of lips or nails and leg cramps with exertion.  GI      Denies bloody stools, dark tarry stools, and indigestion.      Omeprazole control symptoms  Neuro      Denies disturbances in coordination, numbness, poor balance, and tingling.   Physical Exam  General:     in no acute distress; alert,appropriate and cooperative throughout examination Neck:     No deformities, masses, or tenderness noted. Lungs:     Normal respiratory effort, chest expands symmetrically. Lungs are clear to auscultation, no crackles or wheezes. Heart:  normal rate, regular rhythm, no gallop, no rub, no JVD, no HJR, and grade  1/6 systolic murmur.   Abdomen:     Bowel sounds positive,abdomen soft and non-tender without masses, organomegaly or hernias noted. Pulses:     R and L carotid,radial,dorsalis pedis  pulses are full and equal bilaterally. PTP decreased Extremities:     No clubbing, cyanosis, edema Skin:     Intact without suspicious lesions or rashes Psych:     memory intact for recent and remote, normally interactive, and good eye contact.      Impression & Recommendations:  Problem # 1:  HYPERTENSION, ESSENTIAL NOS (ICD-401.9)  The following medications were removed from the medication list:    Atenolol 50 Mg Tabs (Atenolol)  .Marland Kitchen... 1/2 tab qd  Her updated medication list for this problem includes:    Lotrel 5-10 Mg Caps (Amlodipine besy-benazepril hcl) .Marland Kitchen... 1 by mouth qd    Metoprolol Tartrate 50 Mg Tabs (Metoprolol tartrate) .Marland Kitchen... 1/2 once daily   Problem # 2:  HYPERLIPIDEMIA (ICD-272.2)  The following medications were removed from the medication list:    Vytorin 10-20 Mg Tabs (Ezetimibe-simvastatin) .Marland Kitchen... 1/2 by mouth once daily  Her updated medication list for this problem includes:    Pravastatin Sodium 40 Mg Tabs (Pravastatin sodium) .Marland Kitchen... 1 at bedtime   Problem # 3:  VITAMIN D DEFICIENCY (ICD-268.9)  Orders: Venipuncture (91478) T-Vitamin D (25-Hydroxy) (29562-13086)   Problem # 4:  DVT, HX OF (ICD-V12.51) X2  Her updated medication list for this problem includes:    Warfarin Sodium 5 Mg Tabs (Warfarin sodium) .Marland Kitchen... Take as directed clinic   Complete Medication List: 1)  Lotrel 5-10 Mg Caps (Amlodipine besy-benazepril hcl) .Marland Kitchen.. 1 by mouth qd 2)  Warfarin Sodium 5 Mg Tabs (Warfarin sodium) .... Take as directed clinic 3)  Vitamin D 57846 Unit Caps (Ergocalciferol) .... 2 by mouth weekly 4)  Omeprazole 20 Mg Tbec (Omeprazole) .Marland Kitchen.. 1 pill 30 min pre b'fast & eve meal 5)  Metoprolol Tartrate 50 Mg Tabs (Metoprolol tartrate) .... 1/2 once daily 6)  Pravastatin Sodium 40 Mg Tabs (Pravastatin sodium) .Marland Kitchen.. 1 at bedtime  Hypertension Assessment/Plan:      The patient's hypertensive risk group is category C: Target organ damage and/or diabetes.  Her calculated 10 year risk of coronary heart disease is 7 %.  Today's blood pressure is 110/80.    Lipid Assessment/Plan:      Based on NCEP/ATP III, the patient's risk factor category is "history of coronary disease, peripheral vascular disease, cerebrovascular disease, or aortic aneurysm".  From this information, the patient's calculated lipid goals are as follows: Total cholesterol goal is 200; LDL cholesterol goal is 100; HDL cholesterol goal is 40;  Triglyceride goal is 150.  Her LDL cholesterol goal has been met.     Patient Instructions: 1)  Check PT/INR 1 week after Pravastatin started & Vytorin stopped. After 10 weeks of Pravastatin 40 mg have fasting labs done : lipids, hepatic panel(272.4,995.20)   Prescriptions: PRAVASTATIN SODIUM 40 MG TABS (PRAVASTATIN SODIUM) 1 at bedtime  #90 x 0   Entered and Authorized by:   Marga Melnick MD   Signed by:   Marga Melnick MD on 03/20/2008   Method used:   Print then Give to Patient   RxID:   [id number] METOPROLOL TARTRATE 50 MG TABS (METOPROLOL TARTRATE) 1/2 once daily  #45 x 3   Entered and Authorized by:   Marga Melnick MD   Signed by:  Marga Melnick MD on 03/20/2008   Method used:   Print then Give to Patient   RxID:   [id number]  ]

## 2010-05-02 NOTE — Medication Information (Signed)
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 no    Recent/future dental: no  Any missed doses?: no       Is patient compliant with meds? yes       Allergies: 1)  ! Asa 2)  ! * Lipitor 3)  ! * Hctz  Anticoagulation Management History:      The patient is taking warfarin and comes in today for a routine follow up visit.  Positive risk factors for bleeding include an age of 69 years or older.  Negative risk factors for bleeding include no history of CVA/TIA.  The bleeding index is 'intermediate risk'.  Positive CHADS2 values include History of HTN.  Negative CHADS2 values include Age > 43 years old, History of Diabetes, and Prior Stroke/CVA/TIA.  The start date was 06/04/1999.  Anticoagulation responsible provider: Noa Galvao MD, Reuel Boom.  INR POC: 1.8.  Cuvette Lot#: [id number].  Exp: 11/2010.    Anticoagulation Management Assessment/Plan:      The patient's current anticoagulation dose is Warfarin sodium 5 mg  tabs: take as directed CLINIC.  The target INR is 2 - 3.  The next INR is due 10/23/2009.  Anticoagulation instructions were given to patient.  Results were reviewed/authorized by Cloyde Reams, RN, BSN.  She was notified by Cloyde Reams RN.         Prior Anticoagulation Instructions: INR 2.3 Continue 5mg s everyday except 2.5mg s on Wednesdays and Saturdays. Recheck in 4 weeks.   Current Anticoagulation Instructions: INR 1.8  Take 1.5 tablets today, then resume same dosage 1 tablet daily except 1/2 tablet on Wednesdays and Saturdays.  Recheck in 3 weeks.

## 2010-05-02 NOTE — Procedures (Signed)
Summary: COLONOSCOPY  COLONOSCOPY   Imported By: Freddy Jaksch 04/26/2007 11:03:32  _____________________________________________________________________  External Attachment:    Type:   Image     Comment:   External Document

## 2010-05-02 NOTE — Letter (Signed)
Summary: Handout Printed  Printed Handout:  - Coumadin Instructions-w/out Meds 

## 2010-05-02 NOTE — Medication Information (Signed)
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 no    Recent/future dental: no  Any missed doses?: no       Is patient compliant with meds? yes       Allergies (verified): 1)  ! Asa 2)  ! * Lipitor 3)  ! * Hctz  Anticoagulation Management History:      The patient is taking warfarin and comes in today for a routine follow up visit.  Positive risk factors for bleeding include an age of 69 years or older.  Negative risk factors for bleeding include no history of CVA/TIA.  The bleeding index is 'intermediate risk'.  Positive CHADS2 values include History of HTN.  Negative CHADS2 values include Age > 49 years old, History of Diabetes, and Prior Stroke/CVA/TIA.  The start date was 06/04/1999.  Anticoagulation responsible provider: Shirlee Latch MD, Dresden Lozito.  INR POC: 2.6.  Cuvette Lot#: [id number].  Exp: 06/2010.    Anticoagulation Management Assessment/Plan:      The patient's current anticoagulation dose is Warfarin sodium 5 mg  tabs: take as directed CLINIC.  The target INR is 2 - 3.  The next INR is due 05/14/2009.  Anticoagulation instructions were given to patient.  Results were reviewed/authorized by Cloyde Reams, RN, BSN.  She was notified by Shelby Dubin PharmD, BCPS, CPP.         Prior Anticoagulation Instructions: INR 2.5  Continue on same dosage 1 tablet daily except 1/2 tablet on Wednesdays.  Recheck in 4 weeks.    Current Anticoagulation Instructions: INR 2.6  Continue on same dosage 1 tablet daily except 1/2 tablet on Wednesdays. Recheck in 4 weeks.

## 2010-05-02 NOTE — Letter (Signed)
Summary: Labs w/comment per Dr. Jearld Adjutant  Labs w/comment per Dr. Jearld Adjutant   Imported By: Freddy Jaksch 08/20/2007 11:35:36  _____________________________________________________________________  External Attachment:    Type:   Image     Comment:   External Document

## 2010-05-02 NOTE — Assessment & Plan Note (Signed)
Summary: review lab/cbs   Vital Signs:  Patient Profile:   70 Years Old Female Weight:      180.06 pounds Temp:     98.1 degrees F oral Pulse rate:   58 / minute Resp:     16 per minute BP sitting:   100 / 72  (left arm)  Pt. in pain?   no  Vitals Entered By: Ardyth Man (Aug 25, 2007 11:18 AM)                  Chief Complaint:  review labs.  History of Present Illness: She has been on vit D 50,000 International Units once weekly since 12/08; vit D level was 13, now 26 (goal = >35). Last BMD  9/07  @ [facility] ;osteopenia @ hip (T score was -2.2). She is  not on Ca++.No PMH or fractures or  FH osteoporosis.Also Vytorin decreased to 1/2 once daily in 12/08. Lipid panel reviewed ; all @ goal except TG 155.LDL goal = <100. No diet; she walks 1/2 mpd w/o symptoms.  Lipid Management History:      Positive NCEP/ATP III risk factors include female age 18 years old or older and hypertension.  Negative NCEP/ATP III risk factors include no history of early menopause without estrogen hormone replacement, non-diabetic, no family history for ischemic heart disease, non-tobacco-user status, no ASHD (atherosclerotic heart disease), no prior stroke/TIA, no peripheral vascular disease, and no history of aortic aneurysm.       Current Allergies: ! ASA ! * LIPITOR ! * HCTZ     Review of Systems  CV      Denies chest pain or discomfort, leg cramps with exertion, and shortness of breath with exertion.   Physical Exam  General:     Well-developed,well-nourished,in no acute distress; alert,appropriate and cooperative throughout examination Heart:     Normal rate and regular rhythm. S1 and S2 normal without gallop, murmur, click, rub. S4 with slurring Pulses:     R and L carotid,radial,dorsalis pedis and posterior tibial pulses are full and equal bilaterally    Impression & Recommendations:  Problem # 1:  VITAMIN D DEFICIENCY (ICD-268.9)  Orders: Radiology Referral  (Radiology)   Problem # 2:  HYPERLIPIDEMIA (ICD-272.2)  Her updated medication list for this problem includes:    Vytorin 10-20 Mg Tabs (Ezetimibe-simvastatin) .Marland Kitchen... 1/2 by mouth once daily   Problem # 3:  OSTEOPENIA (ICD-733.90)  Her updated medication list for this problem includes:    Fosamax 70 Mg Tabs (Alendronate sodium) .Marland Kitchen... 1 by mouth qweek    Vitamin D 40981 Unit Caps (Ergocalciferol) .Marland Kitchen... 1 pill weekly  Orders: Radiology Referral (Radiology)   Complete Medication List: 1)  Atenolol 50 Mg Tabs (Atenolol) .... 1/2 tab qd 2)  Lotrel 5-10 Mg Caps (Amlodipine besy-benazepril hcl) .Marland Kitchen.. 1 by mouth qd 3)  Vytorin 10-20 Mg Tabs (Ezetimibe-simvastatin) .... 1/2 by mouth once daily 4)  Fosamax 70 Mg Tabs (Alendronate sodium) .Marland Kitchen.. 1 by mouth qweek 5)  Warfarin Sodium 5 Mg Tabs (Warfarin sodium) .... Take as directed clinic 6)  Vitamin D 19147 Unit Caps (Ergocalciferol) .Marland Kitchen.. 1 pill weekly  Lipid Assessment/Plan:      Based on NCEP/ATP III, the patient's risk factor category is "2 or more risk factors and a calculated 10 year CAD risk of < 20%".  The patient's lipid goals have been set as follows: Total cholesterol goal is 200; LDL cholesterol goal is 100; HDL cholesterol goal is 50; Triglyceride goal is  150.  Her LDL cholesterol goal has been met.     Patient Instructions: 1)  Follow [employer] low carb. 2)  Hepatic Panel &  prior to visit, ICD-9:995.20 3)  Lipid Panel prior to visit, ICD-9:272.4 4)  HbgA1C prior to visit, ICD-9:272.4,277.7 5)  Please schedule a follow-up appointment in 1 year. for labs listed above; BUT check vit D level in 11/09 (268.9,733.00). Calcium 600 mg two times a day & continue present vit D 50,000 International Units once weekly.   ]

## 2010-05-02 NOTE — Letter (Signed)
Summary: Results Follow up Letter  [place] at [facility]  [address] [place], [place] [address]   Phone: [phone]  Fax: [phone]    10/20/2007 MRN: [id number]  Bonnie Zhang [address] [place], [place]  [address]  Dear Ms. Sherbert,  The following are the results of your recent test(s):  Test         Result    Pap Smear:        Normal __x___  Not Normal _____ Comments: ______________________________________________________ Cholesterol: LDL(Bad cholesterol):         Your goal is less than:         HDL (Good cholesterol):       Your goal is more than: Comments:  ______________________________________________________ Mammogram:        Normal _____  Not Normal _____ Comments:  ___________________________________________________________________ Hemoccult:        Normal _____  Not normal _______ Comments:    _____________________________________________________________________ Other Tests:    We routinely do not discuss normal results over the telephone.  If you desire a copy of the results, or you have any questions about this information we can discuss them at your next office visit.   Sincerely,

## 2010-05-02 NOTE — Letter (Signed)
Summary: Results Follow up Letter  [place] at [facility]  [address] [place], [place] [address]   Phone: [phone]  Fax: [phone]    04/11/2009 MRN: [id number]  Bonnie Zhang [address] [place], [place]  [address]  Dear Ms. Howeth,  The following are the results of your recent test(s):  Test         Result    Pap Smear:        Normal _____  Not Normal _____ Comments: ______________________________________________________ Cholesterol: LDL(Bad cholesterol):         Your goal is less than:         HDL (Good cholesterol):       Your goal is more than: Comments:  ______________________________________________________ Mammogram:        Normal _____  Not Normal _____ Comments:  ___________________________________________________________________ Hemoccult:        Normal __X___  Not normal _______ Comments:    _____________________________________________________________________ Other Tests:    We routinely do not discuss normal results over the telephone.  If you desire a copy of the results, or you have any questions about this information we can discuss them at your next office visit.   Sincerely,

## 2010-05-02 NOTE — Progress Notes (Signed)
Summary: Lab Results  Phone Note Outgoing Call   Call placed by: Shonna Chock,  March 16, 2009 8:19 AM Call placed to: Patient Summary of Call: Spoke with patient: Patient with pending appointment on Tuesday, patient said she will come fasting for additional labs.  should also have CBC & dif due to coumadin Rx9995.2)  Lipids are excellent & @ goal compared to 07/2008 values. She is due for vitamin D level, fasting hepatic panel, TSH, & BMET (268.9, 401.9,995.2,272.4) with OV follow up  as last seen 02/2008. the reports had not been sent out as I thought she had F/U appt.  Chrae Malloy  March 16, 2009 8:21 AM

## 2010-05-02 NOTE — Letter (Signed)
Summary: Results Follow-up Letter  [place] at [facility]  [address] [place], [place] [address]   Phone: [phone]  Fax: [phone]    08/20/2007        Clovis Cao. Papin [address] [place], [place]  [address]  Dear Ms. Yount,   The following are the results of your recent test(s):  Test     Result     Pap Smear    Normal_______  Not Normal_____       Comments: _________________________________________________________ Cholesterol LDL(Bad cholesterol):          Your goal is less than:         HDL (Good cholesterol):        Your goal is more than: _________________________________________________________ Other Tests:   _________________________________________________________  Please call for an appointment Or Please see attached.________________________________________________________ _________________________________________________________ _________________________________________________________  Sincerely,  [place] at [facility]

## 2010-05-02 NOTE — Progress Notes (Signed)
Summary: **LAB RESULTS**  Phone Note Outgoing Call   Call placed by: Shonna Chock,  February 25, 2008 10:49 AM Call placed to: Patient Summary of Call: CALLED PATIENT LEFT MESSAGE ON MACHINE, REASON FOR CALL;  RE: RECENT LABS  The phone  mammogram report was ;"NO abnormality of L breast; normal asymmetry or slight size variation in breasts". Vit D level has not changed despite Rx for  50,000 International Units vit D once weekly. If not taking that dose ,it should be initiated . If taking that dose , increase to 50,000 International Units 2X/week . After12 weeks  repeat vit D level . [place]   Follow-up for Phone Call        SPOKE WITH PATIENT, PATIENT OK/D INFORMATION Follow-up by: Shonna Chock,  February 25, 2008 4:00 PM    New/Updated Medications: VITAMIN D 72536 UNIT  CAPS (ERGOCALCIFEROL) 2 by mouth WEEKLY   Prescriptions: VITAMIN D 64403 UNIT  CAPS (ERGOCALCIFEROL) 2 by mouth WEEKLY  #8 x 6   Entered by:   Shonna Chock   Authorized by:   Marga Melnick MD   Signed by:   Shonna Chock on 02/25/2008   Method used:   Electronically to        [facility] (retail)       [address]       [place], [place]  [address]       Ph: [phone]       Fax: [phone]   RxID:   [id number]

## 2010-05-02 NOTE — Progress Notes (Signed)
Summary: med refill   Phone Note Refill Request Call back at Home Phone [phone]   Refills Requested: Medication #1:  VYTORIN 10-20 MG  TABS 1/2 by mouth once daily  Medication #2:  VITAMIN D 09811 UNIT  CAPS 1 pill weekly. [facility] [address] the medication was filled but the phamracy says they do not have the medication   Initial call taken by: Charolette Child,  April 02, 2007 11:08 AM      Prescriptions: VITAMIN D 82956 UNIT  CAPS (ERGOCALCIFEROL) 1 pill weekly  #30 x 1   Entered by:   Shary Decamp   Authorized by:   Marga Melnick MD   Signed by:   Shary Decamp on 04/02/2007   Method used:   Electronically sent to ...       [facility]       [address]       [place], [place]  [address]       Ph: [phone]       Fax: [phone]   RxID:   [id number] VYTORIN 10-20 MG  TABS (EZETIMIBE-SIMVASTATIN) 1/2 by mouth once daily  #30 x 1   Entered by:   Shary Decamp   Authorized by:   Marga Melnick MD   Signed by:   Shary Decamp on 04/02/2007   Method used:   Electronically sent to ...       [facility]       [address]       [place], [place]  [address]       Ph: [phone]       Fax: [phone]   RxID:   [id number]

## 2010-05-02 NOTE — Medication Information (Signed)
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 no    Recent/future dental: no  Any missed doses?: no       Is patient compliant with meds? yes       Allergies: 1)  ! Asa 2)  ! * Lipitor 3)  ! * Hctz  Anticoagulation Management History:      The patient is taking warfarin and comes in today for a routine follow up visit.  Positive risk factors for bleeding include an age of 69 years or older.  Negative risk factors for bleeding include no history of CVA/TIA.  The bleeding index is 'intermediate risk'.  Positive CHADS2 values include History of HTN.  Negative CHADS2 values include Age > 23 years old, History of Diabetes, and Prior Stroke/CVA/TIA.  The start date was 06/04/1999.  Anticoagulation responsible provider: Riley Kill MD, Maisie Fus.  INR POC: 2.7.  Cuvette Lot#: [id number].  Exp: 08/2010.    Anticoagulation Management Assessment/Plan:      The patient's current anticoagulation dose is Warfarin sodium 5 mg  tabs: take as directed CLINIC.  The target INR is 2 - 3.  The next INR is due 09/03/2009.  Anticoagulation instructions were given to patient.  Results were reviewed/authorized by Weston Brass, PharmD.  She was notified by Weston Brass PharmD.         Prior Anticoagulation Instructions: INR 2.2  Continue on same dosage 1 tablet daily except 1/2 tablet on Wednesdays and Saturdays.  Recheck in 4 weeks.    Current Anticoagulation Instructions: INR 2.7  Continue same dose of 1 tablet every day except 1/2 tablet on Wednesday and Saturday

## 2010-05-02 NOTE — Medication Information (Signed)
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  Health status changes: no    Bleeding/hemorrhagic complications: no    Recent/future hospitalizations: no    Any changes in medication regimen? no    Recent/future dental: no  Any missed doses?: no       Is patient compliant with meds? yes       Allergies (verified): 1)  ! Asa 2)  ! * Lipitor 3)  ! * Hctz  Anticoagulation Management History:      The patient is taking warfarin and comes in today for a routine follow up visit.  Positive risk factors for bleeding include an age of 65 years or older.  Negative risk factors for bleeding include no history of CVA/TIA.  The bleeding index is 'intermediate risk'.  Positive CHADS2 values include History of HTN.  Negative CHADS2 values include Age > 30 years old, History of Diabetes, and Prior Stroke/CVA/TIA.  The start date was 06/04/1999.  Anticoagulation responsible provider: Excell Seltzer MD, Casimiro Needle.  INR POC: 3.2.  Cuvette Lot#: [id number].  Exp: 12/2009.    Anticoagulation Management Assessment/Plan:      The patient's current anticoagulation dose is Warfarin sodium 5 mg  tabs: take as directed CLINIC.  The target INR is 2 - 3.  The next INR is due 03/20/2009.  Anticoagulation instructions were given to patient.  Results were reviewed/authorized by Leota Sauers, PharmD, BCPS, CPP.  She was notified by Dani Gobble, Pharmacy Student.         Prior Anticoagulation Instructions: INR 2.5  Take half a tablet (2.5 mg) every Wednesday. Take 1 tablet (5 mg) all the other days of the week.     Current Anticoagulation Instructions: INR: 3.2  Take 1/2 tablet today instead of a whole,  then continue with the current dosing regimen of 1 tablet daily except 1/2 tablet on Wednesdays.  Recheck 4 weeks.

## 2010-05-02 NOTE — Medication Information (Signed)
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 no    Recent/future dental: no  Any missed doses?: no       Is patient compliant with meds? yes       Allergies: 1)  ! Asa 2)  ! * Lipitor 3)  ! * Hctz  Anticoagulation Management History:      The patient is taking warfarin and comes in today for a routine follow up visit.  Positive risk factors for bleeding include an age of 47 years or older.  Negative risk factors for bleeding include no history of CVA/TIA.  The bleeding index is 'intermediate risk'.  Positive CHADS2 values include History of HTN.  Negative CHADS2 values include Age > 62 years old, History of Diabetes, and Prior Stroke/CVA/TIA.  The start date was 06/04/1999.  Prothrombin time is 19.2.  Anticoagulation responsible provider: Graciela Husbands MD, Viviann Spare.  INR POC: 2.5.  Cuvette Lot#: [id number].  Exp: 09/2009.    Anticoagulation Management Assessment/Plan:      The patient's current anticoagulation dose is Warfarin sodium 5 mg  tabs: take as directed CLINIC.  The target INR is 2 - 3.  The next INR is due 11/02/2008.  Anticoagulation instructions were given to patient.  Results were reviewed/authorized by Bethena Midget, RN, BSN.  She was notified by Bethena Midget, RN, BSN.         Prior Anticoagulation Instructions: INR 1.7  Take 1 and 1/2 tablet today then resume same dose of 5mg  everyday with 2.5mg  on Wednesday

## 2010-05-02 NOTE — Assessment & Plan Note (Signed)
Summary: meds refill/Recheck vit D level in 6 months. (268.9) .Hopp/nta   Vital Signs:  Patient profile:   70 year old female Height:      60.25 inches Weight:      178.2 pounds BMI:     34.64 Temp:     97.6 degrees F oral Pulse rate:   76 / minute Resp:     14 per minute BP sitting:   122 / 78  (left arm) Cuff size:   large  Vitals Entered By: Shonna Chock CMA (March 21, 2010 11:22 AM) CC: CPX with fasting labs , Lipid Management   Primary Care Provider:  Laury Axon  CC:  CPX with fasting labs  and Lipid Management.  History of Present Illness: Here for Medicare AWV: 1.Risk factors based on Past M, S, F history:see Diagnoses(chart updated) 2. Physical Activities:walking 3-4x/week X 60 min  3.Depression/mood: no issues 4.Hearing: whisper heard @ 6 ft  5.ADL's: no limitations 6.Fall Risk: no imbalance or co-ordination issues 7.Home Safety: safety proofed 8.Height, weight, &visual acuity:wall chart read w/o lenses 9.Counseling: POA & Living Will in place 10.Labs ordered based on risk factors: see Orders 11. Referral Coordination: none requested 12. Care Plan: see Instructions 13.Cognitive Assessment: Oriented X 3, memory & recall excellent  , "WORLD" spelled  backwards, mood & affect  normal. Hypertension Follow-Up:   The patient reports edema @ ankle  LLE (PMH of DVT), but denies lightheadedness, urinary frequency, headaches, and fatigue.  The patient denies the following associated symptoms: chest pain, chest pressure, dyspnea, palpitations, and syncope.  Compliance with medications (by patient report) has been near 100%.  The patient reports that dietary compliance has been good.  Adjunctive measures currently used by the patient include salt restriction.  BP 135 /72 @ home. Hyperlipidemia Follow-Up:  The patient denies muscle aches, GI upset, abdominal pain, flushing, itching, constipation, and diarrhea.  Compliance with medications (by patient report) has been near 100%.   Adjunctive measures currently used by the patient include fiber.    Lipid Management History:      Positive NCEP/ATP III risk factors include female age 75 years old or older and hypertension.  Negative NCEP/ATP III risk factors include no history of early menopause without estrogen hormone replacement, non-diabetic, no family history for ischemic heart disease, non-tobacco-user status, no ASHD (atherosclerotic heart disease), no prior stroke/TIA, no peripheral vascular disease, and no history of aortic aneurysm.     Preventive Screening-Counseling & Management  Alcohol-Tobacco     Alcohol drinks/day: 0     Smoking Status: never  Caffeine-Diet-Exercise     Caffeine use/day: decaf rarely  Hep-HIV-STD-Contraception     Dental Visit-last 6 months yes     Sun Exposure-Excessive: no  Safety-Violence-Falls     Seat Belt Use: yes     Smoke Detectors: yes      Blood Transfusions:  no.        Travel History:  never.    Current Medications (verified): 1)  Lotrel 5-10 Mg  Caps (Amlodipine Besy-Benazepril Hcl) .Marland Kitchen.. 1 By Mouth Qd 2)  Warfarin Sodium 5 Mg  Tabs (Warfarin Sodium) .... Take As Directed Clinic 3)  Omeprazole 20 Mg Tbec (Omeprazole) .Marland Kitchen.. 1 Pill 30 Min Pre B'fast & Eve Meal As Needed 4)  Metoprolol Tartrate 50 Mg Tabs (Metoprolol Tartrate) .... 1/2 Once Daily 5)  Vytorin 10-20 Mg Tabs (Ezetimibe-Simvastatin) .... 1/2 By Mouth Once Daily 6)  Vitamin D 1000 Unit Tabs (Cholecalciferol) .... 2 Once Daily  (Vit D Was  36 After 50,000 International Units Weekly)  Allergies: 1)  ! Asa 2)  ! * Lipitor 3)  ! 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 Osteoporosis: T score -2.6 @ femoral neck, S/P Fosamax   Past Surgical History: C-section times three ; appendectomy with second C section; G 3 P 3 lumpectomy 1987 upper endoscopy  1997 negative (as prelude to GB surgery);Cholecystectomy 1997 Colon polypectomy 2009 , Dr Jarold Motto, due 2014  Family History: father: leukemia died 12 maternal first cousin; diabetes mother: htn, dvt post  ankle fracture maternal grandmother: colon cancer; no MI in FH  Social History: Never Smoked Retired 2006 Alcohol use-no Regular exercise-yes Caffeine use/day:  decaf rarely Dental Care w/in 6 mos.:  yes Sun Exposure-Excessive:  no Seat Belt Use:  yes Blood Transfusions:  no  Review of Systems  The patient denies anorexia, fever, weight loss, weight gain, hoarseness, prolonged cough, hemoptysis, melena, hematochezia, severe indigestion/heartburn, hematuria, suspicious skin lesions, unusual weight change, abnormal bleeding, enlarged lymph nodes, and angioedema.    Physical Exam  General:  well-nourished; alert,appropriate and cooperative throughout examination Head:  Normocephalic and atraumatic without obvious abnormalities.  Eyes:  No corneal or conjunctival inflammation noted.  Perrla. Funduscopic exam benign, without hemorrhages, exudates or papilledema.  Ears:  External ear exam shows no significant lesions or deformities.  Otoscopic examination reveals clear canals, tympanic membranes are intact bilaterally without bulging, retraction, inflammation or discharge. Hearing is grossly normal bilaterally. Nose:  External nasal examination shows no deformity or inflammation. Nasal mucosa are pink and moist without lesions or exudates. Septal dislocation Mouth:  Oral mucosa and oropharynx without lesions or exudates.  Teeth in good repair. Neck:  No deformities, masses, or tenderness noted. Lungs:  Normal respiratory effort, chest expands symmetrically. Lungs are clear to  auscultation, no crackles or wheezes. Heart:  Normal rate and regular rhythm. S1 and S2 normal without gallop, murmur, click, rub .S4 Abdomen:  Bowel sounds positive,abdomen soft and non-tender without masses, organomegaly or hernias noted. Genitalia:  Dr Laury Axon Msk:  No deformity or scoliosis noted of thoracic or lumbar spine.   Pulses:  R and L carotid,radial,dorsalis pedis and posterior tibial pulses are full and equal bilaterally Extremities:  No clubbing, cyanosis, edema. Minor DIP changes Neurologic:  alert & oriented X3 and DTRs symmetrical and normal.   Skin:  Intact without suspicious lesions or rashes Cervical Nodes:  No lymphadenopathy noted Axillary Nodes:  No palpable lymphadenopathy Psych:  memory intact for recent and remote, normally interactive, and good eye contact.     Impression & Recommendations:  Problem # 1:  PREVENTIVE HEALTH CARE (ICD-V70.0)  Orders: Medicare -1st Annual Wellness Visit [phone])  Problem # 2:  HYPERLIPIDEMIA (ICD-272.2)  Her updated medication list for this problem includes:    Vytorin 10-20 Mg Tabs (Ezetimibe-simvastatin) .Marland Kitchen... 1/2 by mouth once daily  Orders: Venipuncture (43329) TLB-Lipid Panel (80061-LIPID) TLB-Hepatic/Liver Function Pnl (80076-HEPATIC) TLB-TSH (Thyroid Stimulating Hormone) (84443-TSH)  Problem # 3:  HYPERTENSION, ESSENTIAL NOS (ICD-401.9)  controlled The following medications were removed from the medication list:    Lotrel 5-10 Mg Caps (Amlodipine besy-benazepril hcl) .Marland Kitchen... 1 by  mouth qd Her updated medication list for this problem includes:    Metoprolol Tartrate 50 Mg Tabs (Metoprolol tartrate) .Marland Kitchen... 1/2 once daily    Benazepril Hcl 10 Mg Tabs (Benazepril hcl) .Marland Kitchen... 1 once daily    Amlodipine Besylate 5 Mg Tabs (Amlodipine besylate) .Marland Kitchen... 1 once daily  Orders: EKG w/ Interpretation (93000) Venipuncture (16109) TLB-BMP (Basic Metabolic Panel-BMET) (80048-METABOL)  Problem # 4:  OSTEOPOROSIS (ICD-733.00) PMH  of  Problem # 5:  VITAMIN D DEFICIENCY (ICD-268.9)  Orders: Venipuncture (60454) T-Vitamin D (25-Hydroxy) (09811-91478)  Problem # 6:  GERD (ICD-530.81)  Her updated medication list for this problem includes:    Omeprazole 20 Mg Tbec (Omeprazole) .Marland Kitchen... 1 pill 30 min pre b'fast & eve meal as needed  Orders: TLB-CBC Platelet - w/Differential (85025-CBCD)  Complete Medication List: 1)  Warfarin Sodium 5 Mg Tabs (Warfarin sodium) .... Take as directed clinic 2)  Omeprazole 20 Mg Tbec (Omeprazole) .Marland Kitchen.. 1 pill 30 min pre b'fast & eve meal as needed 3)  Metoprolol Tartrate 50 Mg Tabs (Metoprolol tartrate) .... 1/2 once daily 4)  Vytorin 10-20 Mg Tabs (Ezetimibe-simvastatin) .... 1/2 by mouth once daily 5)  Vitamin D 1000 Unit Tabs (Cholecalciferol) .... 2 once daily  (vit d was 36 after 50,000 international units weekly) 6)  Benazepril Hcl 10 Mg Tabs (Benazepril hcl) .Marland Kitchen.. 1 once daily 7)  Amlodipine Besylate 5 Mg Tabs (Amlodipine besylate) .Marland Kitchen.. 1 once daily  Other Orders: Tdap => 39yrs IM (29562) Admin 1st Vaccine (13086)  Lipid Assessment/Plan:      Based on NCEP/ATP III, the patient's risk factor category is "2 or more risk factors and a calculated 10 year CAD risk of < 20%".  The patient's lipid goals are as follows: Total cholesterol goal is 200; LDL cholesterol goal is 100; HDL cholesterol goal is 40; Triglyceride goal is 150.  Her LDL cholesterol goal has been met.    Patient Instructions: 1)  BMD every 25 months. Change in Vytorin  to another agent will be determined by lab results.Note: preventive health care up to date Prescriptions: AMLODIPINE BESYLATE 5 MG TABS (AMLODIPINE BESYLATE) 1 once daily  #90 x 3   Entered and Authorized by:   Marga Melnick MD   Signed by:   Marga Melnick MD on 03/21/2010   Method used:   Print then Give to Patient   RxID:   [id number] BENAZEPRIL HCL 10 MG TABS (BENAZEPRIL HCL) 1 once daily  #90 x 3   Entered and Authorized by:   Marga Melnick MD   Signed by:   Marga Melnick MD on 03/21/2010   Method used:   Print then Give to Patient   RxID:   [id number]    Orders Added: 1)  Tdap => 56yrs IM [90715] 2)  Admin 1st Vaccine [90471] 3)  Medicare -1st Annual Wellness Visit [G0438] 4)  Est. Patient Level III [47425] 5)  EKG w/ Interpretation [93000] 6)  Venipuncture [36415] 7)  TLB-Lipid Panel [80061-LIPID] 8)  TLB-BMP (Basic Metabolic Panel-BMET) [80048-METABOL] 9)  TLB-CBC Platelet - w/Differential [85025-CBCD] 10)  TLB-Hepatic/Liver Function Pnl [80076-HEPATIC] 11)  TLB-TSH (Thyroid Stimulating Hormone) [84443-TSH] 12)  T-Vitamin D (25-Hydroxy) [95638-75643]   Immunizations Administered:  Tetanus Vaccine:    Vaccine Type: Tdap    Site: right deltoid    Mfr: GlaxoSmithKline    Dose: 0.5 ml    Route: IM    Given by: Shonna Chock CMA    Exp. Date: 01/18/2012    Lot #: [id number]  VIS given: 02/16/08 version given March 21, 2010.   Immunizations Administered:  Tetanus Vaccine:    Vaccine Type: Tdap    Site: right deltoid    Mfr: GlaxoSmithKline    Dose: 0.5 ml    Route: IM    Given by: Shonna Chock CMA    Exp. Date: 01/18/2012    Lot #: [id number]    VIS given: 02/16/08 version given March 21, 2010.

## 2010-05-02 NOTE — Medication Information (Signed)
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 no    Recent/future dental: no  Any missed doses?: no       Is patient compliant with meds? yes       Allergies: 1)  ! Asa 2)  ! * Lipitor 3)  ! * Hctz  Anticoagulation Management History:      The patient is taking warfarin and comes in today for a routine follow up visit.  Positive risk factors for bleeding include an age of 69 years or older.  Negative risk factors for bleeding include no history of CVA/TIA.  The bleeding index is 'intermediate risk'.  Positive CHADS2 values include History of HTN.  Negative CHADS2 values include Age > 80 years old, History of Diabetes, and Prior Stroke/CVA/TIA.  The start date was 06/04/1999.  Anticoagulation responsible provider: Tenny Craw MD, Gunnar Fusi.  INR POC: 2.1.  Cuvette Lot#: [id number].  Exp: 12/2010.    Anticoagulation Management Assessment/Plan:      The patient's current anticoagulation dose is Warfarin sodium 5 mg  tabs: take as directed CLINIC.  The target INR is 2 - 3.  The next INR is due 04/05/2010.  Anticoagulation instructions were given to patient.  Results were reviewed/authorized by Weston Brass, PharmD.  She was notified by Weston Brass PharmD.         Prior Anticoagulation Instructions: INR 2.2 Continue taking a half tablet on wednesday and saturday. And 1 tablet all other days. Recheck in 3 weeks.   Current Anticoagulation Instructions: INR 2.1  Continue same dose of 1 tablet every day except 1/2 tablet on Wednesday and Saturday.  Recheck INR in 4 weeks.

## 2010-05-02 NOTE — Medication Information (Signed)
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 no    Recent/future dental: no  Any missed doses?: no       Is patient compliant with meds? yes       Allergies: 1)  ! Asa 2)  ! * Lipitor 3)  ! * Hctz  Anticoagulation Management History:      The patient is on coumadin and comes in today for a routine follow up visit.  Positive risk factors for bleeding include an age of 69 years or older.  Negative risk factors for bleeding include no history of CVA/TIA.  The bleeding index is 'intermediate risk'.  Positive CHADS2 values include History of HTN.  Negative CHADS2 values include Age > 71 years old, History of Diabetes, and Prior Stroke/CVA/TIA.  The start date was 06/04/1999.    Anticoagulation Management Assessment/Plan:      The patient's current anticoagulation dose is Warfarin sodium 5 mg  tabs: take as directed CLINIC.  She is to have a 10/05/2008.  Anticoagulation instructions were given to patient.  Results were reviewed/authorized by Weston Brass, PharmD.  She was notified by Weston Brass PharmD.         Prior Anticoagulation Instructions: 5MG  QD /2.5MG  WED  Current Anticoagulation Instructions: INR 1.7  Take 1 and 1/2 tablet today then resume same dose of 5mg  everyday with 2.5mg  on Wednesday

## 2010-05-02 NOTE — Progress Notes (Signed)
Summary: VIT D CONCERNS  Phone Note Call from Patient Call back at Home Phone [phone]   Caller: Patient Summary of Call: MESSAGE LEFT ON VOICEMAIL: PATIENT RECIEVED LABS-PATIENT WITH CONCERNS.  SPOKE WITH PATIENT, PATIENT WANTED TO MAKE SURE WE WERE AWARE THAT SHE IS TAKING 50,000 UNITS OF VIT-D WEEKLY. PATIENT AWARE THAT IS ON MED LIST AND DR.HOPPER IS ADDING 1000 UNITS OF OTC VIT-D DAILY. RECHECK LEVEL IN 1 YEAR. PATIENT OK'D JUST WANTED TO CLARIFY. Initial call taken by: Shonna Chock,  July 24, 2008 12:11 PM    New/Updated Medications: VITAMIN D3 1000 UNIT CAPS (CHOLECALCIFEROL) 1 by mouth once daily

## 2010-05-02 NOTE — Letter (Signed)
Summary: Results Follow up Letter  [place] [facility]  [address] [place], [place] [address]   Phone: [phone]  Fax: [phone]    02/22/2008 MRN: [id number]  Bonnie Zhang [address] [place], [place]  [address]  Dear Ms. Murrill,  The following are the results of your recent test(s):  Test         Result    Pap Smear:        Normal _____  Not Normal _____ Comments: ______________________________________________________ Cholesterol: LDL(Bad cholesterol):         Your goal is less than:         HDL (Good cholesterol):       Your goal is more than: Comments:  ______________________________________________________ Mammogram:        Normal _____  Not Normal _____ Comments:  ___________________________________________________________________ Hemoccult:        Normal _X____  Not normal _______ Comments:    _____________________________________________________________________ Other Tests:    We routinely do not discuss normal results over the telephone.  If you desire a copy of the results, or you have any questions about this information we can discuss them at your next office visit.   Sincerely,

## 2010-05-02 NOTE — Letter (Signed)
Summary: Results Follow up Letter  [place] [facility]  [address] [place], [place] [address]   Phone: [phone]  Fax: [phone]    02/25/2008 MRN: [id number]  Bonnie Zhang [address] [place], [place]  [address]  Dear Ms. Slauson,  The following are the results of your recent test(s):  Test         Result    Pap Smear:        Normal _____  Not Normal _____ Comments: ______________________________________________________ Cholesterol: LDL(Bad cholesterol):         Your goal is less than:         HDL (Good cholesterol):       Your goal is more than: Comments:  ______________________________________________________ Mammogram:        Normal _____  Not Normal _____ Comments:  ___________________________________________________________________ Hemoccult:        Normal _____  Not normal _______ Comments:    _____________________________________________________________________ Other Tests: PLEASE SEE ATTACHED LABS DONE ON 02/03/2008    We routinely do not discuss normal results over the telephone.  If you desire a copy of the results, or you have any questions about this information we can discuss them at your next office visit.   Sincerely,

## 2010-05-02 NOTE — Medication Information (Signed)
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 no    Recent/future dental: no  Any missed doses?: no       Is patient compliant with meds? yes       Allergies: 1)  ! Asa 2)  ! * Lipitor 3)  ! * Hctz  Anticoagulation Management History:      The patient is taking warfarin and comes in today for a routine follow up visit.  Positive risk factors for bleeding include an age of 69 years or older.  Negative risk factors for bleeding include no history of CVA/TIA.  The bleeding index is 'intermediate risk'.  Positive CHADS2 values include History of HTN.  Negative CHADS2 values include Age > 63 years old, History of Diabetes, and Prior Stroke/CVA/TIA.  The start date was 06/04/1999.  Anticoagulation responsible provider: Bensimhon MD, Reuel Boom.  INR POC: 2.5.  Cuvette Lot#: [id number].  Exp: 12/2010.    Anticoagulation Management Assessment/Plan:      The patient's current anticoagulation dose is Warfarin sodium 5 mg  tabs: take as directed CLINIC.  The target INR is 2 - 3.  The next INR is due 12/11/2009.  Anticoagulation instructions were given to patient.  Results were reviewed/authorized by Bethena Midget, RN, BSN.         Prior Anticoagulation Instructions: INR 1.9 Today take 1.5 tablets then change dose to 1 tablet everyday except 1/2 tablet on Wednesdays. Recheck in 3 weeks.   Current Anticoagulation Instructions: INR 2.5  Continue taking 1 tablet (5mg ) every day except take 1/2 tablet (2.5mg ) on Wednesdays.  Recheck in 4 weeks.

## 2010-05-02 NOTE — Medication Information (Signed)
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 no    Recent/future dental: no  Any missed doses?: no       Is patient compliant with meds? yes       Allergies: 1)  ! Asa 2)  ! * Lipitor 3)  ! * Hctz  Anticoagulation Management History:      The patient is taking warfarin and comes in today for a routine follow up visit.  Positive risk factors for bleeding include an age of 69 years or older.  Negative risk factors for bleeding include no history of CVA/TIA.  The bleeding index is 'intermediate risk'.  Positive CHADS2 values include History of HTN.  Negative CHADS2 values include Age > 3 years old, History of Diabetes, and Prior Stroke/CVA/TIA.  The start date was 06/04/1999.  Anticoagulation responsible Seferino Oscar: Antoine Poche MD, Fayrene Fearing.  INR POC: 3.3.  Cuvette Lot#: [id number].  Exp: 01/2011.    Anticoagulation Management Assessment/Plan:      The patient's current anticoagulation dose is Warfarin sodium 5 mg  tabs: take as directed CLINIC.  The target INR is 2 - 3.  The next INR is due 02/04/2010.  Anticoagulation instructions were given to patient.  Results were reviewed/authorized by Cloyde Reams, RN, BSN.  She was notified by Cloyde Reams RN.         Prior Anticoagulation Instructions: INR 2.9  Continue taking one tablet every day except for one-half tablet on Wednesday.  We will see you in four weeks.    Current Anticoagulation Instructions: INR 3.3  Skip today's dosage of Coumadin, then 1 tablet daily except 1/2 tablet on Wednesdays.  Recheck in 3-4 weeks.

## 2010-05-02 NOTE — Progress Notes (Signed)
Summary: Vit D concern  Phone Note Call from Patient Call back at Home Phone [phone]   Caller: Patient Summary of Call: Message left on voicemail: Please call to discuss Vit D  Spoke with patient and discussed that she is to continue all meds and recheck in 6 months, Vit D level was normal BUT a low normal. Patient ok'd information.   Bonnie Zhang  March 29, 2009 3:22 PM

## 2010-05-02 NOTE — Medication Information (Signed)
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 no    Recent/future dental: no  Any missed doses?: no       Is patient compliant with meds? yes       Allergies: 1)  ! Asa 2)  ! * Lipitor 3)  ! * Hctz  Anticoagulation Management History:      The patient is taking warfarin and comes in today for a routine follow up visit.  Positive risk factors for bleeding include an age of 70 years or older.  Negative risk factors for bleeding include no history of CVA/TIA.  The bleeding index is 'intermediate risk'.  Positive CHADS2 values include History of HTN.  Negative CHADS2 values include Age > 62 years old, History of Diabetes, and Prior Stroke/CVA/TIA.  The start date was 06/04/1999.  Anticoagulation responsible Delesha Pohlman: Isidro MD,James.  INR POC: 3.8.  Cuvette Lot#: [id number].  Exp: 01/2011.    Anticoagulation Management Assessment/Plan:      The patient's current anticoagulation dose is Warfarin sodium 5 mg  tabs: take as directed CLINIC.  The target INR is 2 - 3.  The next INR is due 02/15/2010.  Anticoagulation instructions were given to patient.  Results were reviewed/authorized by Lyna Poser, PharmD.         Prior Anticoagulation Instructions: INR 3.3  Skip today's dosage of Coumadin, then 1 tablet daily except 1/2 tablet on Wednesdays.  Recheck in 3-4 weeks.    Current Anticoagulation Instructions: INR 3.8 Skip your dose today. Change your saturday dose to a half tablet. Take a half tablet on wednesday and saturdays. And 1 tablet all other days. Recheck in 1 week.

## 2010-05-02 NOTE — Letter (Signed)
Summary: Results Follow up Letter  [place] [facility]  [address] [place], [place] [address]   Phone: [phone]  Fax: [phone]    03/22/2008 MRN: [id number]  Bonnie Zhang [address] [place], [place]  [address]  Dear Ms. Cuffie,  The following are the results of your recent test(s):  Test         Result    Pap Smear:        Normal _____  Not Normal _____ Comments: ______________________________________________________ Cholesterol: LDL(Bad cholesterol):         Your goal is less than:         HDL (Good cholesterol):       Your goal is more than: Comments:  ______________________________________________________ Mammogram:        Normal _____  Not Normal _____ Comments:  ___________________________________________________________________ Hemoccult:        Normal _____  Not normal _______ Comments:    _____________________________________________________________________ Other Tests:Please see attached labs done on 03/20/2008   We routinely do not discuss normal results over the telephone.  If you desire a copy of the results, or you have any questions about this information we can discuss them at your next office visit.   Sincerely,

## 2010-05-02 NOTE — Letter (Signed)
Summary: Results Follow up Letter  [place] [facility]  [address] [place], [place] [address]   Phone: [phone]  Fax: [phone]    08/25/2008 MRN: [id number]  Bonnie Zhang [address] [place], [place]  [address]  Dear Ms. Stuckey,  The following are the results of your recent test(s):  Test         Result    Pap Smear:        Normal _____  Not Normal _____ Comments: ______________________________________________________ Cholesterol: LDL(Bad cholesterol):         Your goal is less than:         HDL (Good cholesterol):       Your goal is more than: Comments:  ______________________________________________________ Mammogram:        Normal _____  Not Normal _____ Comments:  ___________________________________________________________________ Hemoccult:        Normal _____  Not normal _______ Comments:    _____________________________________________________________________ Other Tests: Please see attached labs done on 08/16/2008 and appointment card to recheck labs in 6 months    We routinely do not discuss normal results over the telephone.  If you desire a copy of the results, or you have any questions about this information we can discuss them at your next office visit.   Sincerely,

## 2010-05-02 NOTE — Assessment & Plan Note (Signed)
Summary: med check//tl   Vital Signs:  Patient Profile:   70 Years Old Female Weight:      178.38 pounds Pulse rate:   52 / minute Pulse rhythm:   regular Resp:     17 per minute BP sitting:   120 / 80  (left arm) Cuff size:   large  Pt. in pain?   no  Vitals Entered By: Wendall Stade (March 18, 2007 9:43 AM)                  Chief Complaint:  needs new scripts.  History of Present Illness: Essentially asymptomatic @ present.  Hypertension History:      She complains of peripheral edema, but denies headache, chest pain, palpitations, dyspnea with exertion, orthopnea, PND, visual symptoms, neurologic problems, syncope, and side effects from treatment.  She notes no problems with any antihypertensive medication side effects.  Further comments include: BP < 120/80 on average; chronic edema LLE ( DVT X 2).        Positive major cardiovascular risk factors include female age 57 years old or older, hyperlipidemia, and hypertension.  Negative major cardiovascular risk factors include no history of diabetes, negative family history for ischemic heart disease, and non-tobacco-user status.        Further assessment for target organ damage reveals no history of ASHD, stroke/TIA, or peripheral vascular disease.    Lipid Management History:      Positive NCEP/ATP III risk factors include female age 33 years old or older and hypertension.  Negative NCEP/ATP III risk factors include no history of early menopause without estrogen hormone replacement, non-diabetic, no family history for ischemic heart disease, non-tobacco-user status, no ASHD (atherosclerotic heart disease), no prior stroke/TIA, no peripheral vascular disease, and no history of aortic aneurysm.      Current Allergies (reviewed today): ! ASA ! * LIPITOR ! 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 ; negative coagulopathy panel       GERD  Osteopenia       epistaxis  Past Surgical History:    Reviewed history and no changes required:       c-section times three with appendectomy       gallbladder 1997       DVT 1965 & 2001       negative clotting studies       lumpectomy       upper endo 1997       epistaxix (ER) 2003       Upper Endo 1977   Family History:    Reviewed history and no changes required:       father leukemia died 59       maternal first cousin diabetes       mother htn, dvt with ankle fracture       maternal grandmother colon cancer  Social History:    Reviewed history and no changes required:       Never Smoked   Risk Factors:  Tobacco use:  never  Family History Risk Factors:    Family History of MI in females < 32 years old:  no    Family History of MI in males < 80 years old:  no   Review of Systems  GI      Denies abdominal pain, bloody stools, change in bowel habits, constipation, dark tarry stools, diarrhea, indigestion,  nausea, and vomiting.      No colonoscopy because of coumadin Rx;   MS      Complains of joint pain.      Last BMD 9/07; on Vit D 400 & 1200 mg Ca++; cortisone shots knee X 2   Physical Exam  General:     Well-developed,well-nourished,in no acute distress; alert,appropriate and cooperative throughout examination Neck:     No deformities, masses, or tenderness noted. Lungs:     Normal respiratory effort, chest expands symmetrically. Lungs are clear to auscultation, no crackles or wheezes. Heart:     normal rate, regular rhythm, no gallop, no rub, no JVD, no HJR, and grade 1 /2 -1 /6 systolic murmur.   Abdomen:     Bowel sounds positive,abdomen soft and non-tender without masses, organomegaly or hernias noted. Msk:     No deformity or scoliosis noted of thoracic or lumbar spine.   Pulses:     R and L carotid,radial,dorsalis pedis and posterior tibial pulses are full and equal bilaterally Extremities:     DJD hands; crepitus knees Neurologic:      strength normal in all extremities, gait normal, and DTRs symmetrical and normal.   Skin:     Intact without suspicious lesions or rashes Psych:     memory intact for recent and remote, normally interactive, good eye contact, not anxious appearing, and not depressed appearing.      Impression & Recommendations:  Problem # 1:  HYPERLIPIDEMIA (ICD-272.2)  Her updated medication list for this problem includes:    Vytorin 10-20 Mg Tabs (Ezetimibe-simvastatin) .Marland Kitchen... 1 by mouth qd  Orders: TLB-Lipid Panel (80061-LIPID) TLB-Hepatic/Liver Function Pnl (80076-HEPATIC)   Problem # 2:  HYPERTENSION, ESSENTIAL NOS (ICD-401.9)  Her updated medication list for this problem includes:    Atenolol 50 Mg Tabs (Atenolol) .Marland Kitchen... 1/2 tab qd    Lotrel 5-10 Mg Caps (Amlodipine besy-benazepril hcl) .Marland Kitchen... 1 by mouth qd  Orders: TLB-Creatinine, Blood (82565-CREA) TLB-BUN (Urea Nitrogen) (84520-BUN) TLB-Potassium (K+) (84132-K)   Problem # 3:  OSTEOPOROSIS (ICD-733.00)  Her updated medication list for this problem includes:    Fosamax 70 Mg Tabs (Alendronate sodium) .Marland Kitchen... 1 by mouth qweek  Orders: T- * Misc. 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 1/2 tab qd 2)  Lotrel 5-10 Mg Caps (Amlodipine besy-benazepril hcl) .Marland Kitchen.. 1 by mouth qd 3)  Vytorin 10-20 Mg Tabs (Ezetimibe-simvastatin) .Marland Kitchen.. 1 by mouth qd 4)  Fosamax 70 Mg Tabs (Alendronate sodium) .Marland Kitchen.. 1 by mouth qweek 5)  Warfarin Sodium 5 Mg Tabs (Warfarin sodium) .... Take as directed clinic  Hypertension Assessment/Plan:      The patient's hypertensive risk group is category B: At least one risk factor (excluding diabetes) with no target organ damage.  Today's  blood pressure is 120/80.    Lipid Assessment/Plan:      Based on NCEP/ATP III, the patient's risk factor category is "2 or more risk factors and a calculated 10 year CAD risk of < 20%".  From this information, the patient's calculated lipid goals are as follows: Total cholesterol goal is 200; LDL cholesterol goal is 130; HDL cholesterol goal is 40; Triglyceride goal is 150.  Her LDL cholesterol goal has been met.  Patient Instructions: 1)  Complete stool cards.    Prescriptions: FOSAMAX 70 MG  TABS (ALENDRONATE SODIUM) 1 by mouth qweek  #12 x 3   Entered and Authorized by:   Marga Melnick MD   Signed by:   Marga Melnick MD on 03/18/2007   Method used:   Print then Give to Patient   RxID:   [id number] VYTORIN 10-20 MG  TABS (EZETIMIBE-SIMVASTATIN) 1 by mouth qd  #90 x 3   Entered and Authorized by:   Marga Melnick MD   Signed by:   Marga Melnick MD on 03/18/2007   Method used:   Print then Give to Patient   RxID:   [id number] LOTREL 5-10 MG  CAPS (AMLODIPINE BESY-BENAZEPRIL HCL) 1 by mouth qd  #90 x 3   Entered and Authorized by:   Marga Melnick MD   Signed by:   Marga Melnick MD on 03/18/2007   Method used:   Print then Give to Patient   RxID:   [id number] ATENOLOL 50 MG TABS (ATENOLOL) 1/2 tab qd  #90 x 1   Entered and Authorized by:   Marga Melnick MD   Signed by:   Marga Melnick MD on 03/18/2007   Method used:   Print then Give to Patient   RxID:   [id number]  ]

## 2010-05-02 NOTE — Progress Notes (Signed)
Summary: REFILL REQUEST  Phone Note Call from Patient   Summary of Call: PATIENT WAS SEEN TODAY, PATIENT REQUESTED REFILL ON ALL MEDS @ THE TIME OF VISIT. PATIENT SAID ONE MED WAS MISSING. LOTREL 5-10MG  once daily. PATIENT WOULD LIKE RX FILLED X 1 YEAR. [facility] Initial call taken by: Shonna Chock,  March 20, 2008 4:36 PM      Prescriptions: LOTREL 5-10 MG  CAPS (AMLODIPINE BESY-BENAZEPRIL HCL) 1 by mouth qd  #90 x 3   Entered by:   Shonna Chock   Authorized by:   Marga Melnick MD   Signed by:   Shonna Chock on 03/20/2008   Method used:   Electronically to        [facility] (retail)       [address]       [place], [place]  [address]       Ph: [phone]       Fax: [phone]   RxID:   [id number]

## 2010-05-02 NOTE — Medication Information (Signed)
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 no    Recent/future dental: no  Any missed doses?: no       Is patient compliant with meds? yes       Allergies: 1)  ! Asa 2)  ! * Lipitor 3)  ! * Hctz  Anticoagulation Management History:      The patient is taking warfarin and comes in today for a routine follow up visit.  Positive risk factors for bleeding include an age of 69 years or older.  Negative risk factors for bleeding include no history of CVA/TIA.  The bleeding index is 'intermediate risk'.  Positive CHADS2 values include History of HTN.  Negative CHADS2 values include Age > 38 years old, History of Diabetes, and Prior Stroke/CVA/TIA.  The start date was 06/04/1999.  Anticoagulation responsible provider: Shirlee Latch MD, Dalton.  INR POC: 2.6.  Cuvette Lot#: [id number].  Exp: 07/2010.    Anticoagulation Management Assessment/Plan:      The patient's current anticoagulation dose is Warfarin sodium 5 mg  tabs: take as directed CLINIC.  The target INR is 2 - 3.  The next INR is due 07/09/2009.  Anticoagulation instructions were given to patient.  Results were reviewed/authorized by Cloyde Reams, RN, BSN.  She was notified by Cloyde Reams RN.         Prior Anticoagulation Instructions: Skip 1 day then 1 tab daily except Wednesday and Saturday take 0.5 tab.   INR 3.6  Recheck in 3/14 at 930 am.    Current Anticoagulation Instructions: INR 2.6  Continue on same dosage 1 tablet daily except 1/2 tablet on Wednesdays and Saturdays.  Recheck in 4 weeks.

## 2010-05-02 NOTE — Medication Information (Signed)
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 no    Recent/future dental: no  Any missed doses?: no       Is patient compliant with meds? yes       Allergies: 1)  ! Asa 2)  ! * Lipitor 3)  ! * Hctz  Anticoagulation Management History:      The patient is taking warfarin and comes in today for a routine follow up visit.  Positive risk factors for bleeding include an age of 67 years or older.  Negative risk factors for bleeding include no history of CVA/TIA.  The bleeding index is 'intermediate risk'.  Positive CHADS2 values include History of HTN.  Negative CHADS2 values include Age > 45 years old, History of Diabetes, and Prior Stroke/CVA/TIA.  The start date was 06/04/1999.  Anticoagulation responsible provider: Clifton James MD, Cristal Deer.  INR POC: 2.9.  Cuvette Lot#: [id number].  Exp: 01/2011.    Anticoagulation Management Assessment/Plan:      The patient's current anticoagulation dose is Warfarin sodium 5 mg  tabs: take as directed CLINIC.  The target INR is 2 - 3.  The next INR is due 01/07/2010.  Anticoagulation instructions were given to patient.  Results were reviewed/authorized by Weston Brass, PharmD.  She was notified by Kennieth Francois.         Prior Anticoagulation Instructions: INR 2.5  Continue taking 1 tablet (5mg ) every day except take 1/2 tablet (2.5mg ) on Wednesdays.  Recheck in 4 weeks.   Current Anticoagulation Instructions: INR 2.9  Continue taking one tablet every day except for one-half tablet on Wednesday.  We will see you in four weeks.

## 2010-05-02 NOTE — Medication Information (Signed)
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 no    Recent/future dental: no  Any missed doses?: no       Is patient compliant with meds? yes       Allergies: 1)  ! Asa 2)  ! * Lipitor 3)  ! * Hctz  Anticoagulation Management History:      The patient is taking warfarin and comes in today for a routine follow up visit.  Positive risk factors for bleeding include an age of 70 years or older.  Negative risk factors for bleeding include no history of CVA/TIA.  The bleeding index is 'intermediate risk'.  Positive CHADS2 values include History of HTN.  Negative CHADS2 values include Age > 44 years old, History of Diabetes, and Prior Stroke/CVA/TIA.  The start date was 06/04/1999.  Anticoagulation responsible provider: Clifton James MD, Cristal Deer.  INR POC: 2.2.  Cuvette Lot#: [id number].  Exp: 07/2010.    Anticoagulation Management Assessment/Plan:      The patient's current anticoagulation dose is Warfarin sodium 5 mg  tabs: take as directed CLINIC.  The target INR is 2 - 3.  The next INR is due 08/06/2009.  Anticoagulation instructions were given to patient.  Results were reviewed/authorized by Cloyde Reams, RN, BSN.  She was notified by Cloyde Reams RN.         Prior Anticoagulation Instructions: INR 2.6  Continue on same dosage 1 tablet daily except 1/2 tablet on Wednesdays and Saturdays.  Recheck in 4 weeks.    Current Anticoagulation Instructions: INR 2.2  Continue on same dosage 1 tablet daily except 1/2 tablet on Wednesdays and Saturdays.  Recheck in 4 weeks.

## 2010-05-02 NOTE — Assessment & Plan Note (Signed)
Summary: MED REFILL/ALR   Vital Signs:  Patient profile:   70 year old female Height:      60.5 inches Weight:      176 pounds BMI:     33.93 Temp:     98.3 degrees F oral Pulse rate:   64 / minute Resp:     14 per minute BP sitting:   138 / 84  (left arm) Cuff size:   large  Vitals Entered By: Shonna Chock (March 20, 2009 11:25 AM) CC: Yearly Follow-Up and refill meds: Lotrel & Metoprolol. Fasting for labs , Hypertension Management, Lipid Management Comments REVIEWED MED LIST, PATIENT AGREED DOSE AND INSTRUCTION CORRECT    Primary Care Redina Zeller:  Laury Axon  CC:  Yearly Follow-Up and refill meds: Lotrel & Metoprolol. Fasting for labs , Hypertension Management, and Lipid Management.  History of Present Illness: No Cardiology evaluation since Dr Chales Abrahams left  ? in 2007.Lipids are @ goal. No specific diet but no fried foods. CVE as walking 2-3X/ week for 20-30 min w/o symptoms.  Hypertension History:      She complains of peripheral edema, but denies headache, chest pain, palpitations, dyspnea with exertion, orthopnea, PND, visual symptoms, neurologic problems, syncope, and side effects from treatment.  She notes no problems with any antihypertensive medication side effects.  BP not checked @ home; essentially same @ drug store. Occa edema in LLE , site of prior DVT.        Positive major cardiovascular risk factors include female age 78 years old or older, hyperlipidemia, and hypertension.  Negative major cardiovascular risk factors include no history of diabetes, negative family history for ischemic heart disease, and non-tobacco-user status.        Positive history for target organ damage include peripheral vascular disease.  Further assessment for target organ damage reveals no history of ASHD or stroke/TIA.    Lipid Management History:      Positive NCEP/ATP III risk factors include female age 54 years old or older, hypertension, and peripheral vascular disease.  Negative NCEP/ATP III  risk factors include no history of early menopause without estrogen hormone replacement, non-diabetic, no family history for ischemic heart disease, non-tobacco-user status, no ASHD (atherosclerotic heart disease), no prior stroke/TIA, and no history of aortic aneurysm.      Preventive Screening-Counseling & Management  Caffeine-Diet-Exercise     Does Patient Exercise: yes  Allergies: 1)  ! Asa 2)  ! * Lipitor 3)  ! 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 Hypertension Colonic polyps, hx of, 2009,Dr Patterson Hyperlipidemia  Past Surgical History: C-section times three with appendectomy lumpectomy 1987 upper endo 1997 negative (as prelude to GB surgery);Cholecystectomy 1997 Colon polypectomy 2009 , Dr Jarold Motto  Family History: father leukemia died 65 maternal first cousin diabetes mother htn, dvt post  ankle fracture maternal grandmother colon cancer; no MI in FH  Social History: Never Smoked Retired Alcohol use-no Regular exercise-yes Does Patient Exercise:  yes  Review of Systems Eyes:  Denies blurring, double vision, and vision loss-both eyes. ENT:  Denies difficulty swallowing and hoarseness. CV:  Denies leg cramps with exertion, lightheadness, and near fainting. Resp:  Denies cough, shortness of breath, and sputum productive; No PMH of apnea. GI:  Denies abdominal pain,  bloody stools, dark tarry stools, and indigestion; No dysphagia. GU:  Denies discharge, dysuria, and hematuria. MS:  Complains of joint pain; denies joint redness, joint swelling, low back pain, mid back pain, and thoracic pain. Derm:  Denies changes in nail beds, dryness, hair loss, and lesion(s). Neuro:  Denies disturbances in coordination, numbness, poor  balance, and tingling. Psych:  Denies anxiety, depression, easily angered, easily tearful, and irritability. Endo:  Denies cold intolerance, excessive hunger, excessive thirst, excessive urination, and heat intolerance. Heme:  Denies bleeding; Monitored [facility] , PT/INR 2.5 yesterday. Allergy:  Denies itching eyes and sneezing.  Physical Exam  General:  well-nourished,in no acute distress; alert,appropriate and cooperative throughout examination Eyes:  No corneal or conjunctival inflammation noted.No icterus.Perrla. Mouth:  Oral mucosa and oropharynx without lesions or exudates.  Teeth in good repair. No pharyngeal erythema.   Neck:  No deformities, masses, or tenderness noted. Lungs:  Normal respiratory effort, chest expands symmetrically. Lungs are clear to auscultation, no crackles or wheezes. Heart:  Normal rate and regular rhythm. S1 and S2 normal without gallop, murmur, click, rub. S4 with slurring Abdomen:  Bowel sounds positive,abdomen soft and non-tender without masses, organomegaly or hernias noted. Dullness to percussion LUQ Genitalia:  Given stool  cards Pulses:  R and L carotid,radial,dorsalis pedis and posterior tibial pulses are full and equal bilaterally Extremities:  No clubbing, cyanosis, edema. Crepitus L > R knee. Minor DJD of fingers Neurologic:  alert & oriented X3 and DTRs symmetrical and normal.   Skin:  Intact without suspicious lesions or rashes Cervical Nodes:  No lymphadenopathy noted Axillary Nodes:  No palpable lymphadenopathy Psych:  memory intact for recent and remote, normally interactive, and good eye contact.     Impression & Recommendations:  Problem # 1:  HYPERTENSION, ESSENTIAL NOS (ICD-401.9) Controlled Her updated medication list for this problem includes:    Lotrel 5-10 Mg Caps (Amlodipine besy-benazepril hcl) .[facility]... 1 by mouth qd    Metoprolol Tartrate 50 Mg Tabs (Metoprolol tartrate) .[facility] Kitchen... 1/2 once daily  Orders: TLB-BMP (Basic  Metabolic Panel-BMET) (80048-METABOL) EKG w/ Interpretation (93000)  Problem # 2:  HYPERLIPIDEMIA (ICD-272.2) Lipids @ goal Her updated medication list for this problem includes:    Vytorin 10-20 Mg Tabs (Ezetimibe-simvastatin) .Marland Kitchen... 1/2 by mouth once daily  Orders: TLB-Hepatic/Liver Function Pnl (80076-HEPATIC)  Problem # 3:  COLONIC POLYPS, HX OF (ICD-V12.72) as per Dr Jarold Motto  Problem # 4:  DYSMETABOLIC SYNDROME X (ICD-277.7)  Problem # 5:  OSTEOPENIA (ICD-733.90)  Problem # 6:  GERD (ICD-530.81) Stable Her updated medication list for this problem includes:    Omeprazole 20 Mg Tbec (Omeprazole) .Marland Kitchen... 1 pill 30 min pre b'fast & eve meal as needed  Complete Medication List: 1)  Lotrel 5-10 Mg Caps (Amlodipine besy-benazepril hcl) .Marland Kitchen.. 1 by mouth qd 2)  Warfarin Sodium 5 Mg Tabs (Warfarin sodium) .... Take as directed clinic 3)  Vitamin D 62952 Unit Caps (Ergocalciferol) .Marland Kitchen.. 1 by mouth weekly 4)  Omeprazole 20 Mg Tbec (Omeprazole) .Marland Kitchen.. 1 pill 30 min pre b'fast & eve meal as needed 5)  Metoprolol Tartrate 50 Mg Tabs (Metoprolol tartrate) .... 1/2 once daily 6)  Vytorin 10-20 Mg Tabs (Ezetimibe-simvastatin) .... 1/2 by mouth once daily 7)  Vitamin D3 1000 Unit Caps (Cholecalciferol) .Marland Kitchen.. 1 by mouth once daily  Other Orders: TLB-CBC Platelet - w/Differential (85025-CBCD) TLB-TSH (Thyroid Stimulating Hormone) (84443-TSH) T-Vitamin D (25-Hydroxy) (84132-44010)  Hypertension Assessment/Plan:      The patient's hypertensive risk group is category  C: Target organ damage and/or diabetes.  Her calculated 10 year risk of coronary heart disease is 7 %.  Today's blood pressure is 138/84.    Lipid Assessment/Plan:      Based on NCEP/ATP III, the patient's risk factor category is "history of coronary disease, peripheral vascular disease, cerebrovascular disease, or aortic aneurysm".  The patient's lipid goals are as follows: Total cholesterol goal is 200; LDL cholesterol goal is 100; HDL  cholesterol goal is 40; Triglyceride goal is 150.  Her LDL cholesterol goal has been met.    Patient Instructions: 1)  Follow the 40(oz water),35 (inches @ waist),30 (minutes of walking 3X/week), & 25 (LESS THAN 25 grams sugar / day from LABELED foods & drinks ) Guidelines. 2)  Avoid foods high in acid (tomatoes, citrus juices, spicy foods). Avoid eating within two hours of lying down or before exercising. Do not over eat; try smaller more frequent meals. Elevate head of bed twelve inches when sleeping. 3)  Check your Blood Pressure regularly. If it is above:135/85 ON AVERAGE  you should make an appointment. (Note : on 50,000 International Units vit D weekly & 1000 International Units once daily ) Prescriptions: METOPROLOL TARTRATE 50 MG TABS (METOPROLOL TARTRATE) 1/2 once daily  #45 x 3   Entered and Authorized by:   Marga Melnick MD   Signed by:   Marga Melnick MD on 03/20/2009   Method used:   Faxed to ...       [facility]       [address]       [place], [place]  [address]       Ph: [phone]       Fax: [phone]   RxID:   [id number] LOTREL 5-10 MG  CAPS (AMLODIPINE BESY-BENAZEPRIL HCL) 1 by mouth qd  #90 Each x 3   Entered and Authorized by:   Marga Melnick MD   Signed by:   Marga Melnick MD on 03/20/2009   Method used:   Faxed to ...       [facility]       [address]       [place], [place]  [address]       Ph: [phone]       Fax: [phone]   RxID:   [id number]

## 2010-05-03 ENCOUNTER — Ambulatory Visit: Admit: 2010-05-03 | Payer: Self-pay

## 2010-05-03 ENCOUNTER — Encounter: Payer: Self-pay | Admitting: Cardiology

## 2010-05-03 ENCOUNTER — Encounter (INDEPENDENT_AMBULATORY_CARE_PROVIDER_SITE_OTHER): Payer: Medicare Other

## 2010-05-03 DIAGNOSIS — I80299 Phlebitis and thrombophlebitis of other deep vessels of unspecified lower extremity: Secondary | ICD-10-CM

## 2010-05-03 DIAGNOSIS — Z7901 Long term (current) use of anticoagulants: Secondary | ICD-10-CM

## 2010-05-03 LAB — CONVERTED CEMR LAB: POC INR: 2.9

## 2010-05-08 NOTE — Medication Information (Signed)
Summary: rov/kh   Anticoagulant Therapy  Managed by: Tammy Sours, PharmD Referring MD: Marga Melnick MD PCP: Alda Lea MD: Jens Som MD, Arlys John Indication 1: Deep Vein Thrombosis - Leg (ICD-451.1) Lab Used: [facility] Site: [facility] INR POC 2.9 INR RANGE 2 - 3  Dietary changes: no    Health status changes: no    Bleeding/hemorrhagic complications: no    Recent/future hospitalizations: no    Any changes in medication regimen? no    Recent/future dental: no  Any missed doses?: no       Is patient compliant with meds? yes       Allergies: 1)  ! Asa 2)  ! * Lipitor 3)  ! * Hctz  Anticoagulation Management History:      The patient is taking warfarin and comes in today for a routine follow up visit.  Positive risk factors for bleeding include an age of 69 years or older.  Negative risk factors for bleeding include no history of CVA/TIA.  The bleeding index is 'intermediate risk'.  Positive CHADS2 values include History of HTN.  Negative CHADS2 values include Age > 74 years old, History of Diabetes, and Prior Stroke/CVA/TIA.  The start date was 06/04/1999.  Anticoagulation responsible provider: Jens Som MD, Arlys John.  INR POC: 2.9.  Cuvette Lot#: [id number].  Exp: 05/2011.    Anticoagulation Management Assessment/Plan:      The patient's current anticoagulation dose is Warfarin sodium 5 mg  tabs: take as directed CLINIC.  The target INR is 2 - 3.  The next INR is due 05/31/2010.  Anticoagulation instructions were given to patient.  Results were reviewed/authorized by Tammy Sours, PharmD.         Prior Anticoagulation Instructions: INR 2.9  Coumadin 5 mg tablets - Take 1 tablet every day except 1/2 tablet on Wednesdays and Saturdays   Current Anticoagulation Instructions: INR 2.9   Continue taking 1 tablet everday except 1/2 tablet on Wednesdays and Saturdays. Recheck INR in 4 weeks.

## 2010-05-31 ENCOUNTER — Encounter (INDEPENDENT_AMBULATORY_CARE_PROVIDER_SITE_OTHER): Payer: Medicare Other

## 2010-05-31 ENCOUNTER — Encounter: Payer: Self-pay | Admitting: Internal Medicine

## 2010-05-31 DIAGNOSIS — I80299 Phlebitis and thrombophlebitis of other deep vessels of unspecified lower extremity: Secondary | ICD-10-CM

## 2010-05-31 DIAGNOSIS — Z7901 Long term (current) use of anticoagulants: Secondary | ICD-10-CM

## 2010-05-31 LAB — CONVERTED CEMR LAB: POC INR: 2.6

## 2010-06-06 NOTE — Medication Information (Signed)
Summary: rov/sp  Anticoagulant Therapy  Managed by: Windell Hummingbird, RN Referring MD: Marga Melnick MD PCP: Alda Lea MD: Gala Romney MD, Reuel Boom Indication 1: Deep Vein Thrombosis - Leg (ICD-451.1) Lab Used: [facility] Site: [facility] INR POC 2.6 INR RANGE 2 - 3  Dietary changes: no    Health status changes: no    Bleeding/hemorrhagic complications: no    Recent/future hospitalizations: no    Any changes in medication regimen? no    Recent/future dental: no  Any missed doses?: no       Is patient compliant with meds? yes       Allergies: 1)  ! Asa 2)  ! * Lipitor 3)  ! * Hctz  Anticoagulation Management History:      The patient is taking warfarin and comes in today for a routine follow up visit.  Positive risk factors for bleeding include an age of 69 years or older.  Negative risk factors for bleeding include no history of CVA/TIA.  The bleeding index is 'intermediate risk'.  Positive CHADS2 values include History of HTN.  Negative CHADS2 values include Age > 45 years old, History of Diabetes, and Prior Stroke/CVA/TIA.  The start date was 06/04/1999.  Anticoagulation responsible provider: Bensimhon MD, Reuel Boom.  INR POC: 2.6.  Cuvette Lot#: [id number].  Exp: 05/2011.    Anticoagulation Management Assessment/Plan:      The patient's current anticoagulation dose is Warfarin sodium 5 mg  tabs: take as directed CLINIC.  The target INR is 2 - 3.  The next INR is due 06/28/2010.  Anticoagulation instructions were given to patient.  Results were reviewed/authorized by Windell Hummingbird, RN.  She was notified by Windell Hummingbird, RN.         Prior Anticoagulation Instructions: INR 2.9   Continue taking 1 tablet everday except 1/2 tablet on Wednesdays and Saturdays. Recheck INR in 4 weeks.   Current Anticoagulation Instructions: INR 2.6 Continue taking 1 tablet every day, except take 1/2 tablet on Wednesdays and Saturdays. Recheck in 4 weeks.

## 2010-06-28 ENCOUNTER — Ambulatory Visit (INDEPENDENT_AMBULATORY_CARE_PROVIDER_SITE_OTHER): Payer: Medicare Other | Admitting: *Deleted

## 2010-06-28 DIAGNOSIS — Z86718 Personal history of other venous thrombosis and embolism: Secondary | ICD-10-CM

## 2010-06-28 DIAGNOSIS — Z7901 Long term (current) use of anticoagulants: Secondary | ICD-10-CM

## 2010-06-28 LAB — POCT INR: INR: 3

## 2010-06-28 NOTE — Patient Instructions (Signed)
Continue on same dosage 1 tablet daily except 1/2 tablet on Wednesdays and Saturdays.  Recheck in 4 weeks.

## 2010-07-01 ENCOUNTER — Telehealth: Payer: Self-pay | Admitting: *Deleted

## 2010-07-01 MED ORDER — SIMVASTATIN 20 MG PO TABS: 20.0000 mg | ORAL_TABLET | Freq: Every day | ORAL | Status: DC

## 2010-07-01 NOTE — Telephone Encounter (Signed)
Addended by: Doristine Devoid on: 07/01/2010 12:36 PM   Modules accepted: Orders

## 2010-07-01 NOTE — Telephone Encounter (Signed)
Medication sent to pharmacy  

## 2010-07-26 ENCOUNTER — Ambulatory Visit (INDEPENDENT_AMBULATORY_CARE_PROVIDER_SITE_OTHER): Payer: Medicare Other | Admitting: *Deleted

## 2010-07-26 DIAGNOSIS — Z7901 Long term (current) use of anticoagulants: Secondary | ICD-10-CM

## 2010-07-26 DIAGNOSIS — Z86718 Personal history of other venous thrombosis and embolism: Secondary | ICD-10-CM

## 2010-07-26 LAB — POCT INR: INR: 2.2

## 2010-08-13 NOTE — Assessment & Plan Note (Signed)
[facility]                         GASTROENTEROLOGY OFFICE NOTE   GIULIANNA, Bonnie Zhang                   MRN:          [id number]  DATE:04/15/2007                            DOB:          10-28-1940    Mrs. Manring is a 70 year old white female referred for screening  colonoscopy.   I previously saw Mrs. Ziemann several years ago and has scheduled her for  a colonoscopy off of Coumadin, but this was never completed.  This was  actually in August of 2003.  At that time she was fairly asymptomatic,  except for some acid reflux problems, she had had endoscopy performed  previously in 1997 which showed a moderate sized hiatal hernia.  She  currently says she is asymptomatic and only uses antacids and over the  counter Prilosec on a p.r.n. basis.  She certainly denies any dysphagia  or other problems.  She is status post cholecystectomy that was done in  1997, when she was found to have cholelithiasis.  She is having fairly  regular bowel movements, denies melena or hematochezia.  She has some  concern that her maternal grandmother had colon cancer, but at age 38.  The patient follows a regular diet, denies any food intolerances.  She  is followed by Dr. Lona Kettle, has had negative heme-occult cards and  recent laboratory data showing normal CBC and metabolic profile.   PAST MEDICAL HISTORY:  Remarkable for hypertensive cardiovascular  disease and high cholesterolemia.  She had deep venous thrombosis 1965  and 2001 and continues on Coumadin, as directed per the Coumadin clinic.  She is status post cholecystectomy, tubal ligation and has had some  benign breast surgery in 1987.   MEDICATIONS:  1. Atenolol 25 mg a day, varying Coumadin doses.  2. Lotrel 5/10 mg a day.  3. Vytorin 10-20, half a tablet a day.  4. Fosamax 70 mg a week.  5. Vitamin D daily.   ALLERGIES:  SHE DENIES DRUG ALLERGIES.   FAMILY HISTORY:  Otherwise noncontributory, except  for her maternal  grandmother having colon cancer.   SOCIAL HISTORY:  She is divorced and lives alone.  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  She does not  smoke or abuse ethanol.   REVIEW OF SYSTEMS:  Noncontributory, except for some arthralgias  associated with her degenerative joint disease.  She does have vitamin D  deficiency, was recently started on vitamin D supplementation by Dr.  Alwyn Ren.  She carries a diagnosis of osteoporosis additionally.  Review  of systems otherwise noncontributory.   EXAMINATION:  GENERAL:  She is a healthy-appearing white female,  appearing her stated age, in no acute distress.  VITAL SIGNS:  She is 5' 2 and weighs 181 pounds.  Blood pressure 134/80  and pulse was 68 and regular.  I could not appreciate stigmata of  chronic liver disease.  CHEST:  Clear.  She was in a regular rhythm without murmurs, gallops, or  rubs.  I could not appreciate hepatosplenomegaly, abdominal masses or  tenderness.  ABDOMEN:  Bowel sounds were normal.  EXTREMITIES:  Were unremarkable without edema, phlebitis, swollen  joints.  MENTAL STATUS:  Clear.  RECTAL:  Exam was deferred.   ASSESSMENT:  1. Family history of colon carcinoma in her maternal grandmother at      age 50, which really does not increase her risk for colon cancer.      However, the patient has never had colonoscopy and certainly needs      this done as a screening procedure.  We will make adjustments in      her Coumadin for this procedure, but I think she could hold her      Coumadin for 5 days, without any increased risk of complications.  2. Known hiatal hernia and intermittent acid reflux disease.  3. Status post cholecystectomy.  4. Hypertensive cardiovascular disease.  5. History of osteoporosis and vitamin D deficiency.  6. History of previous removal of benign breast lump in 1987.  7. History of a previous deep venous thrombosis, which is somewhat       remote.   RECOMMENDATIONS:  1. Colonoscopy exam, off of Coumadin for 5 days.  2. Reflux regime reviewed with the patient.  3. Continue all other multiple medications as per Dr. Alwyn Ren.     Vania Rea. Jarold Motto, MD, Caleen Essex, FAGA  Electronically Signed    DRP/MedQ  DD: 04/15/2007  DT: 04/15/2007  Job #: [id number]

## 2010-08-16 NOTE — Discharge Summary (Signed)
[place]  Patient:    Bonnie Zhang, Bonnie Zhang                   MRN: [id number] Adm. Date:  [id number] Disc. Date: [id number] Attending:  Heber Waverly Dictator:   Cornell Barman, P.A. CC:         Titus Dubin. Alwyn Ren, M.D. [facility]                           Discharge Summary  DISCHARGE DIAGNOSIS:  Left lower extremity deep venous thrombosis.  HISTORY OF PRESENT ILLNESS:  Ms. Glad is a 70 year old white female who presented with a two-day history of left leg pain.  She was seen by Dr. Debby Bud in the office. She was sent to the vascular lab where a Doppler showed an acute DVT in the proximal calf, through the popliteal and to the proximal femoral vein.  Common femoral vein was patent without chronic area of DVT.  Patient with DVT postpartum in 1965.  PAST MEDICAL HISTORY: 1. Benign breast lump in 1987. 2. Status post cholecystectomy in 1997. 3. Status post cesarean section x 2 in 1965 and 1966.  LABS ON ADMISSION:  CBC with differential was normal.  CMET was normal except for a potassium of 3.4.  HOSPITAL COURSE:  Left DVT:  The patient was admitted for Lovenox and Coumadin er pharmacy.  We also obtained some lab work because the patient had had a remote VT. We obtained an antithrombin III, protein C&S, factor V Leidin, prothrombin 20210A mutation, ______ anticoagulant, anticardiolipin antibody, and a homocysteine level.  These labs are still pending.  The patient did well with no complaints f chest pain or shortness of breath maintaining good O2 saturations.  Patient will be discharged home on Lovenox and Coumadin.  She is instructed to stay at home and  keep left leg elevated as much as possible.  She is instructed not to return to  work until after she sees Dr. Alwyn Ren.  LABS AT DISCHARGE:  Pro time was 15.1, INR 1.4.  DISCHARGE MEDICATIONS: 1. Lovenox 80 mg p.o. b.i.d., length of doses to be determined. 2. Coumadin, dose to be  determined prior to discharge.  FOLLOW-UP:  Follow up with Dr. Alwyn Ren on March 19 at 11:45 a.m.  Also, follow up at the Coumadin clinic.  We did call and leave a message with Shelby Dubin today and they can schedule an appointment for her. DD:  06/07/99 TD:  06/08/99 Job: [id number]

## 2010-08-16 NOTE — Assessment & Plan Note (Signed)
[facility]                          [place] [facility] OFFICE NOTE   [name], [name]                   MRN:          [id number]  DATE:02/03/2006                            DOB:          06-10-1940    Bonnie Zhang has had pain in the left foot since July 2007.  There was  no injury.  It is on the ventral surface of the left foot.  It is associated  with daily soreness, worse at night when barefooted.  It does improve when  she wears shoes.   Films of the left foot were negative.  Uric acid, CPK have been normal.  The  latter was done because of statin therapy.  She is on calcium, vitamin E as  well as Fosamax for osteoporosis with a minus 2.6 T score in the left  femoral neck.   She was initially seen August 27 and prescribed arch supports which have  failed to control the symptoms.   When seen for complete physical examination on September 27, she was  continuing to have the pain.  The musculoskeletal exam is unremarkable  except for crepitus in her knees.   A limited bone scan revealed no fracture or active bone disease.  If her  symptoms persist, then I would recommend a podiatry or orthopedic  consultation.     Titus Dubin. Alwyn Ren, MD,FACP,FCCP  Electronically Signed    [facility]/MedQ  DD: 02/03/2006  DT: 02/04/2006  Job #: [id number]

## 2010-08-16 NOTE — H&P (Signed)
[place]  Patient:    Bonnie Zhang, Bonnie Zhang                   MRN: [id number] Adm. Date:  [id number] Attending:  Heber McIntosh CC:         Titus Dubin. Alwyn Ren, M.D. [facility]                         History and Physical  HISTORY OF PRESENT ILLNESS:  Bonnie Zhang is a 70 year old white female who presented to the office with a two-day history of left leg pain.  She was seen as a work-in by Dr. Debby Bud in Dr. Caryl Never absence.  Due to pain and obvious swelling of her left lower extremity she was sent to the vascular lab at [facility], where Doppler studies showed acute DVT in the proximal calf which extended to the proximal femoral vein.  Her common femoral vein was patent but with chronic areas of DVT.  The patient does have a history of DVT 35 years ago which was postpartum in 1965.  She states at that time she received "shots" in her abdomen and then pills.  She has had no further problems since then.  She has no damage or trauma to the lower extremities.  She denies any shortness of breath.  PAST MEDICAL HISTORY:  Significant for a breast lump which was benign in 1987. She is status post cholecystectomy in 1997.  Two C-sections, 1965 and 1966.  ALLERGIES:  ASPIRIN causes nausea.  MEDICATIONS:  Prempro 0.625/2.5 q.d.  SOCIAL HISTORY:  She is divorced.  She has two children ages 30 and 26.  She has three grandchildren.  She works as a Field seismologist.  She may travel in a car frequently two hours at a time.  FAMILY HISTORY:  Her sister is in good health.  Her mother died at age 73 of kidney failure.  Her father died at age 26 of lung cancer and leukemia.  There is a history of diabetes in cousins, but none in her or her sister.  REVIEW OF SYSTEMS:  She gets regular maintenance with Pap smears and mammograms on a yearly basis.  PHYSICAL EXAMINATION:  VITAL SIGNS:  Temperature 99.3, blood pressure 163/82, respiratory  rate 20, pulse 82.  GENERAL:  She is a well-appearing white female in no acute distress.  HEENT:  Atraumatic, normocephalic.  Sclerae without icterus.  Pupils are equal,  round, and reactive to light.  Oropharynx is clear.  NECK:  Supple without JVD.  LUNGS:  Clear to auscultation.  CARDIAC:  Regular rate and rhythm with a 1/6 systolic murmur heard best at the eft sternal border.  ABDOMEN:  Soft and nontender.  It is mildly obese.  There are no masses appreciated.  EXTREMITIES:  Left leg mildly swollen, obviously larger than her right.  She has 2+ pulses.  No pitting edema.  NEUROLOGIC:  She is alert and oriented.  Moves all fours.  Reflexes are symmetrical.  LABORATORY:  Pending.  Doppler exam as above showing acute DVT.  IMPRESSION AND PLAN:  The patient with remote history of deep venous thrombosis, now with acute deep venous thrombosis of the left lower extremity.  Risk factors include sedentary job, with obesity.  I plan to admit to initiate anticoagulation therapy given the extensive clot of the left lower extremity.  Pharmacy will manage this.  Prempro will be discontinued.  We will also check hypercoagulation  studies.  DD:  06/05/99 TD:  06/05/99 Job: [id number]

## 2010-08-23 ENCOUNTER — Ambulatory Visit (INDEPENDENT_AMBULATORY_CARE_PROVIDER_SITE_OTHER): Payer: Medicare Other | Admitting: *Deleted

## 2010-08-23 DIAGNOSIS — Z86718 Personal history of other venous thrombosis and embolism: Secondary | ICD-10-CM

## 2010-08-23 DIAGNOSIS — I80299 Phlebitis and thrombophlebitis of other deep vessels of unspecified lower extremity: Secondary | ICD-10-CM

## 2010-08-23 DIAGNOSIS — Z7901 Long term (current) use of anticoagulants: Secondary | ICD-10-CM

## 2010-08-23 LAB — POCT INR: INR: 2.6

## 2010-09-03 ENCOUNTER — Ambulatory Visit: Payer: Medicare Other | Admitting: Gynecology

## 2010-09-10 ENCOUNTER — Other Ambulatory Visit (HOSPITAL_COMMUNITY)
Admission: RE | Admit: 2010-09-10 | Discharge: 2010-09-10 | Disposition: A | Discharge status: Home / Self Care | Payer: Medicare Other | Source: Ambulatory Visit | Attending: Family Medicine | Admitting: Family Medicine

## 2010-09-10 ENCOUNTER — Ambulatory Visit (INDEPENDENT_AMBULATORY_CARE_PROVIDER_SITE_OTHER): Payer: Medicare Other | Admitting: Family Medicine

## 2010-09-10 DIAGNOSIS — Z1272 Encounter for screening for malignant neoplasm of vagina: Secondary | ICD-10-CM

## 2010-09-10 DIAGNOSIS — Z01419 Encounter for gynecological examination (general) (routine) without abnormal findings: Secondary | ICD-10-CM | POA: Insufficient documentation

## 2010-09-10 DIAGNOSIS — N950 Postmenopausal bleeding: Secondary | ICD-10-CM

## 2010-09-11 ENCOUNTER — Other Ambulatory Visit: Payer: Self-pay | Admitting: Family Medicine

## 2010-09-11 DIAGNOSIS — N950 Postmenopausal bleeding: Secondary | ICD-10-CM

## 2010-09-11 NOTE — Assessment & Plan Note (Signed)
NAME:  Bonnie Zhang, Bonnie Zhang NO.:  [id number]  MEDICAL RECORD NO.:  [id number]          PATIENT TYPE:  LOCATION:  [facility] at [facility]           FACILITY:  PHYSICIAN:  Tinnie Gens, MD             DATE OF BIRTH:  DATE OF SERVICE:                                 CLINIC NOTE  CHIEF COMPLAINT:  Postmenopausal bleeding.  HISTORY OF PRESENT ILLNESS:  The patient is a 70 year old para 3 who has been menopausal since age [age] approximately 20 years.  Over the last several months, she has had noticed a dark black discharge followed by some pink spotting.  She is concerned about this and self-referred herself to our office.  She denies significant weight changes.  Her last Pap was at 66 or 67 and all has been normal.  She denies pelvic pain.  PAST MEDICAL HISTORY:  Significant for a DVT with pregnancy in 1965 followed by another one when she was on Prempro in 2001, has been on Coumadin 5 mg daily since that time.  She has history of hypertension, elevated cholesterol, and low vitamin D.  PAST SURGICAL HISTORY:  She had a breast lumpectomy for benign disease, C-section x3, a lap cholecystectomy.  MEDICATIONS: 1. Warfarin 5 mg daily. 2. Metoprolol 25 mg. 3. Benazepril 10 mg daily. 4. Amlodipine 5 mg daily. 5. Simvastatin 10 mg daily. 6. Vitamin D 20,000 international units daily.  ALLERGIES:  ASPIRIN, LIPITOR, and HCTZ.  No latex allergy.  FAMILY HISTORY:  Her father is deceased at 24 of leukemia.  Mother deceased at 1 of kidney failure.  SOCIAL HISTORY:  No tobacco, alcohol.  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  REVIEW OF SYSTEMS:  A 14 point review of systems reviewed.  She denies headache, vision changes, chest pain, shortness of breath, abdominal pain, pain, nausea, vomiting, diarrhea, constipation.  PHYSICAL EXAMINATION:  VITAL SIGNS:  As noted in the chart.  Blood pressure 124/75, weight 178, pulse 81. GENERAL:  She is a  well-nourished female in no acute distress, looks her stated age. ABDOMEN:  Soft, nontender, nondistended. GU:  External genitalia:  She has a small 0.25 x 0.25 mm sore area in the left labia majora which looks somewhat to an ingrown hair. Certainly has no warty like appearance or appearance suggestive of VIN. She has labial agglutination which were taken down with a swab.  There was little bit of rawness of the tissue noted upon taking this down. The patient is no longer sexually active.  The speculum was placed into the vagina.  The cervix was visualized.  It appears nulliparous and postmenopausal and small.  A Pap smear was obtained.  PROCEDURE:  Cervix was grasped with single-tooth tenaculum, cleaned with Betadine x2 and an attempt was made at endometrial sampling.  An os finder and cervical dilators were used and the cervix could not be penetrated.  At this point, the attempt at endometrial biopsy was aborted.  IMPRESSION:  Postmenopausal bleeding of unclear etiology.  PLAN:  We will do a pelvic sonogram to look at her endometrium.  She will return in 1-2 weeks for repeat biopsy attempt and she will take Cytotec  400 mcg the night prior to on the day of the procedure to see if we can have better luck at doing an office procedure. I did call pharmacy and assured that it was okay to take misoprostol with warfarin and they agreed with this.  We will have ultrasound results hopefully by that time as well.  Should that attempt fail, depending on ultrasound results would consider D and C, hysteroscopy.          ______________________________ Tinnie Gens, MD    TP/MEDQ  D:  09/10/2010  T:  09/11/2010  Job:  [id number]  cc:   Marga Melnick, MD

## 2010-09-16 ENCOUNTER — Ambulatory Visit (HOSPITAL_COMMUNITY)
Admission: RE | Admit: 2010-09-16 | Discharge: 2010-09-16 | Disposition: A | Discharge status: Home / Self Care | Payer: Medicare Other | Source: Ambulatory Visit | Attending: Family Medicine | Admitting: Family Medicine

## 2010-09-16 DIAGNOSIS — D259 Leiomyoma of uterus, unspecified: Secondary | ICD-10-CM | POA: Insufficient documentation

## 2010-09-16 DIAGNOSIS — N950 Postmenopausal bleeding: Secondary | ICD-10-CM | POA: Insufficient documentation

## 2010-09-20 ENCOUNTER — Ambulatory Visit (INDEPENDENT_AMBULATORY_CARE_PROVIDER_SITE_OTHER): Payer: Medicare Other | Admitting: *Deleted

## 2010-09-20 DIAGNOSIS — Z86718 Personal history of other venous thrombosis and embolism: Secondary | ICD-10-CM

## 2010-09-20 DIAGNOSIS — Z7901 Long term (current) use of anticoagulants: Secondary | ICD-10-CM

## 2010-09-20 LAB — POCT INR: INR: 1.6

## 2010-09-24 ENCOUNTER — Ambulatory Visit (INDEPENDENT_AMBULATORY_CARE_PROVIDER_SITE_OTHER): Payer: Medicare Other | Admitting: Family Medicine

## 2010-09-24 DIAGNOSIS — N950 Postmenopausal bleeding: Secondary | ICD-10-CM

## 2010-09-25 NOTE — Assessment & Plan Note (Signed)
NAME:  Bonnie Zhang, Bonnie Zhang NO.:  [id number]  MEDICAL RECORD NO.:  [id number]           PATIENT TYPE:  LOCATION:  [facility] at [facility]           FACILITY:  PHYSICIAN:  Tinnie Gens, MD        DATE OF BIRTH:  04/19/1940  DATE OF SERVICE:  09/23/2010                                 CLINIC NOTE  CHIEF COMPLAINT:  Followup for endometrial biopsy.  HISTORY OF PRESENT ILLNESS:  The patient is a 70 year old, para 3 who approximately 20 years postmenopausal who came in with postmenopausal bleeding in for annual exam.  She was last seen on September 10, 2010. Attempt at that point was done for endometrial sampling, however, because of cervical stenosis we could not get in.  The decision was then made to give her Cytotec.  She got 400 micrograms at bedtime and 400 mcg before the procedure, and she comes back in today to reattempt this. Additionally since her last visit, she underwent pelvic ultrasound which shows a thickened endometrium of 2 cm.  PROCEDURE:  The patient was placed in dorsal lithotomy.  Speculum was placed inside the vagina.  Cervix was visualized, cleaned with Betadine x2, grasped anteriorly with single-tooth tenaculum.  An os finder was used to try to get through the cervix.  The external os was penetrated but the internal os could not be opened with confidence.  So this procedure was terminated.  IMPRESSION:  Postmenopausal bleeding with a thickened endometrial stripe or lining, failed endometrial biopsy x2 in the office.  Plan will be for a hysteroscopy D and C as soon as possible in the OR. The risks and benefits of the procedure were discussed the patient.          ______________________________ Tinnie Gens, MD    TP/MEDQ  D:  09/24/2010  T:  09/25/2010  Job:  [id number]

## 2010-09-26 ENCOUNTER — Encounter (HOSPITAL_COMMUNITY)
Admission: RE | Admit: 2010-09-26 | Discharge: 2010-09-26 | Disposition: A | Discharge status: Home / Self Care | Payer: Medicare Other | Source: Ambulatory Visit | Attending: Family Medicine | Admitting: Family Medicine

## 2010-09-26 LAB — CBC
HCT: 44.4 % (ref 36.0–46.0)
Hemoglobin: 14.8 g/dL (ref 12.0–15.0)
MCH: 29.5 pg (ref 26.0–34.0)
MCHC: 33.3 g/dL (ref 30.0–36.0)
MCV: 88.6 fL (ref 78.0–100.0)
Platelets: 191 10*3/uL (ref 150–400)
RBC: 5.01 MIL/uL (ref 3.87–5.11)
RDW: 13.8 % (ref 11.5–15.5)
WBC: 8.5 10*3/uL (ref 4.0–10.5)

## 2010-09-26 LAB — BASIC METABOLIC PANEL
BUN: 10 mg/dL (ref 6–23)
CO2: 27 mEq/L (ref 19–32)
Calcium: 9.6 mg/dL (ref 8.4–10.5)
Chloride: 101 mEq/L (ref 96–112)
Creatinine, Ser: 0.79 mg/dL (ref 0.50–1.10)
GFR calc Af Amer: >60 mL/min (ref >60)
GFR calc non Af Amer: >60 mL/min (ref >60)
Glucose, Bld: 95 mg/dL (ref 70–99)
Potassium: 3.8 mEq/L (ref 3.5–5.1)
Sodium: 139 mEq/L (ref 135–145)

## 2010-09-30 ENCOUNTER — Ambulatory Visit (HOSPITAL_COMMUNITY)
Admission: RE | Admit: 2010-09-30 | Discharge: 2010-09-30 | Disposition: A | Discharge status: Home / Self Care | Payer: Medicare Other | Source: Ambulatory Visit | Attending: Family Medicine | Admitting: Family Medicine

## 2010-09-30 ENCOUNTER — Other Ambulatory Visit: Payer: Self-pay | Admitting: Family Medicine

## 2010-09-30 DIAGNOSIS — Z01818 Encounter for other preprocedural examination: Secondary | ICD-10-CM | POA: Insufficient documentation

## 2010-09-30 DIAGNOSIS — Z01812 Encounter for preprocedural laboratory examination: Secondary | ICD-10-CM

## 2010-09-30 DIAGNOSIS — N840 Polyp of corpus uteri: Secondary | ICD-10-CM | POA: Insufficient documentation

## 2010-09-30 DIAGNOSIS — N950 Postmenopausal bleeding: Secondary | ICD-10-CM

## 2010-10-03 NOTE — Op Note (Signed)
  NAME:  ICESS, BERTONI NO.:  [id number]  MEDICAL RECORD NO.:  [id number]  LOCATION:  [facility]                          FACILITY:  [facility]  PHYSICIAN:  Anis Degidio S. Shawnie Pons, M.D.   DATE OF BIRTH:  08-04-1940  DATE OF PROCEDURE:  09/30/2010 DATE OF DISCHARGE:                              OPERATIVE REPORT   PREOPERATIVE DIAGNOSIS:  Postmenopausal bleeding.  POSTOPERATIVE DIAGNOSIS:  Postmenopausal bleeding.  PROCEDURE:  Dilation and curettage, hysteroscopy with polyp removal.  SURGEON:  Daxson Reffett S. Shawnie Pons, MD  ASSISTANT:  None.  ANESTHESIA:  General and local.  FINDINGS:  A large polyp anteriorly.  SPECIMEN:  Endometrial curettings plus polyp to pathology.  ESTIMATED BLOOD LOSS:  Minimal.  COMPLICATIONS:  None known.  REASONS FOR PROCEDURE:  Briefly, the patient is a 70 year old white female who is 20 years postmenopausal who came in with some postmenopausal bleeding.  Endometrial sampling was unsuccessful in the office and ultrasound had revealed a thickened endometrial stripe.  The endometrial thickness was 2 cm.  PROCEDURE IN DETAIL:  The patient was taken to the OR where she was placed in dorsal lithotomy.  She was prepped and draped in usual sterile fashion.  A red rubber catheter was used to drain her bladder.  A time- out was performed.  SCDs were in place.  A speculum was then placed inside the vagina.  The cervix was visualized.  It was grasped anteriorly with a single-tooth tenaculum.  It was injected with 0.25% Marcaine, 20 mL for paracervical block.  The #13 Shawnie Pons dilator was then easily entered into the uterine cavity.  Sequential dilation was done until the hysteroscope could be placed.  Immediately upon entry into the cavity, there was a large polypoid mass noted to be filling the cavity. It seemed to be coming anteriorly.  The ostia could not be seen with confidence.  The hysteroscope was then removed.  Polyp forceps were used and a small piece of the  polyp was removed but the rest of it could not be taken out.  The endometrial curetting was done with sharp curettage until this mass was felt to be there.  The hysteroscope was reintroduced.  The mass could still be seen anteriorly.  A different type of polyp forceps with a curve on it finally was able to grasp it and remove the polyp in pieces but we did eventually get all the polyp out.  The hysteroscope was introduced one last time and the endometrial cavity appeared normal.  Sharp curettage was performed one more time just to ensure that we adequately sampled the entirety of the cavity.  All instruments were then removed from the vagina.  All instruments and lap counts were correct x2.  The patient was awakened and taken to recovery room in stable condition.     Shelbie Proctor. Shawnie Pons, M.D.     TSP/MEDQ  D:  09/30/2010  T:  10/01/2010  Job:  [id number]  Electronically Signed by Tinnie Gens M.D. on 10/03/2010 02:35:06 PM

## 2010-10-09 ENCOUNTER — Ambulatory Visit (INDEPENDENT_AMBULATORY_CARE_PROVIDER_SITE_OTHER): Payer: Medicare Other | Admitting: Family Medicine

## 2010-10-09 ENCOUNTER — Encounter: Payer: Self-pay | Admitting: Family Medicine

## 2010-10-09 VITALS — BP 134/74 | HR 70 | Ht 61.0 in | Wt 179.0 lb

## 2010-10-09 DIAGNOSIS — Z09 Encounter for follow-up examination after completed treatment for conditions other than malignant neoplasm: Secondary | ICD-10-CM | POA: Insufficient documentation

## 2010-10-09 DIAGNOSIS — N8502 Endometrial intraepithelial neoplasia [EIN]: Secondary | ICD-10-CM | POA: Insufficient documentation

## 2010-10-09 DIAGNOSIS — N950 Postmenopausal bleeding: Secondary | ICD-10-CM

## 2010-10-09 NOTE — Progress Notes (Signed)
  History Here for post op visit.  Pathology reviewed, benign endometrial polyps noted.  Physical exam VS reviewed WD/WN female in NAD Abd-soft, NT  Assessment Postmenopausal bleeding and post op check after D & C Hysteroscopy  Plan F/u PRN

## 2010-10-10 NOTE — Progress Notes (Signed)
Addended by: Reva Bores on: 10/10/2010 01:05 PM   Modules accepted: Kipp Brood

## 2010-10-11 ENCOUNTER — Ambulatory Visit (INDEPENDENT_AMBULATORY_CARE_PROVIDER_SITE_OTHER): Payer: Medicare Other | Admitting: *Deleted

## 2010-10-11 DIAGNOSIS — Z86718 Personal history of other venous thrombosis and embolism: Secondary | ICD-10-CM

## 2010-10-11 DIAGNOSIS — Z7901 Long term (current) use of anticoagulants: Secondary | ICD-10-CM

## 2010-10-11 LAB — POCT INR: INR: 1.7

## 2010-10-26 ENCOUNTER — Other Ambulatory Visit: Payer: Self-pay | Admitting: Cardiology

## 2010-11-01 ENCOUNTER — Ambulatory Visit (INDEPENDENT_AMBULATORY_CARE_PROVIDER_SITE_OTHER): Payer: Medicare Other | Admitting: *Deleted

## 2010-11-01 DIAGNOSIS — Z7901 Long term (current) use of anticoagulants: Secondary | ICD-10-CM

## 2010-11-01 DIAGNOSIS — Z86718 Personal history of other venous thrombosis and embolism: Secondary | ICD-10-CM

## 2010-11-01 LAB — POCT INR: INR: 2.5

## 2010-11-29 ENCOUNTER — Ambulatory Visit (INDEPENDENT_AMBULATORY_CARE_PROVIDER_SITE_OTHER): Payer: Medicare Other | Admitting: *Deleted

## 2010-11-29 DIAGNOSIS — Z7901 Long term (current) use of anticoagulants: Secondary | ICD-10-CM

## 2010-11-29 DIAGNOSIS — Z86718 Personal history of other venous thrombosis and embolism: Secondary | ICD-10-CM

## 2010-11-29 LAB — POCT INR: INR: 3.1

## 2010-12-26 ENCOUNTER — Encounter: Payer: Medicare Other | Admitting: *Deleted

## 2010-12-27 ENCOUNTER — Ambulatory Visit (INDEPENDENT_AMBULATORY_CARE_PROVIDER_SITE_OTHER): Payer: Medicare Other | Admitting: *Deleted

## 2010-12-27 DIAGNOSIS — Z7901 Long term (current) use of anticoagulants: Secondary | ICD-10-CM

## 2010-12-27 DIAGNOSIS — Z86718 Personal history of other venous thrombosis and embolism: Secondary | ICD-10-CM

## 2010-12-27 LAB — POCT INR: INR: 4.1

## 2011-01-15 ENCOUNTER — Ambulatory Visit (INDEPENDENT_AMBULATORY_CARE_PROVIDER_SITE_OTHER): Payer: Medicare Other | Admitting: *Deleted

## 2011-01-15 DIAGNOSIS — Z86718 Personal history of other venous thrombosis and embolism: Secondary | ICD-10-CM

## 2011-01-15 DIAGNOSIS — Z7901 Long term (current) use of anticoagulants: Secondary | ICD-10-CM

## 2011-01-15 LAB — POCT INR: INR: 3.4

## 2011-01-29 ENCOUNTER — Ambulatory Visit (INDEPENDENT_AMBULATORY_CARE_PROVIDER_SITE_OTHER): Payer: Medicare Other | Admitting: *Deleted

## 2011-01-29 ENCOUNTER — Encounter: Payer: Medicare Other | Admitting: *Deleted

## 2011-01-29 DIAGNOSIS — Z7901 Long term (current) use of anticoagulants: Secondary | ICD-10-CM

## 2011-01-29 DIAGNOSIS — Z86718 Personal history of other venous thrombosis and embolism: Secondary | ICD-10-CM

## 2011-01-29 LAB — POCT INR: INR: 3.3

## 2011-02-12 ENCOUNTER — Ambulatory Visit (INDEPENDENT_AMBULATORY_CARE_PROVIDER_SITE_OTHER): Payer: Medicare Other | Admitting: *Deleted

## 2011-02-12 DIAGNOSIS — Z86718 Personal history of other venous thrombosis and embolism: Secondary | ICD-10-CM

## 2011-02-12 DIAGNOSIS — Z7901 Long term (current) use of anticoagulants: Secondary | ICD-10-CM

## 2011-02-12 LAB — POCT INR: INR: 2

## 2011-03-05 ENCOUNTER — Ambulatory Visit (INDEPENDENT_AMBULATORY_CARE_PROVIDER_SITE_OTHER): Payer: Medicare Other | Admitting: *Deleted

## 2011-03-05 DIAGNOSIS — Z86718 Personal history of other venous thrombosis and embolism: Secondary | ICD-10-CM

## 2011-03-05 DIAGNOSIS — Z7901 Long term (current) use of anticoagulants: Secondary | ICD-10-CM

## 2011-03-05 LAB — POCT INR: INR: 2.3

## 2011-03-24 ENCOUNTER — Encounter: Payer: Self-pay | Admitting: Internal Medicine

## 2011-03-27 ENCOUNTER — Encounter: Payer: Self-pay | Admitting: Internal Medicine

## 2011-03-27 ENCOUNTER — Ambulatory Visit (INDEPENDENT_AMBULATORY_CARE_PROVIDER_SITE_OTHER): Payer: Medicare Other | Admitting: Internal Medicine

## 2011-03-27 VITALS — BP 126/68 | HR 87 | Temp 97.9°F | Resp 14 | Ht 61.0 in | Wt 183.0 lb

## 2011-03-27 DIAGNOSIS — R7309 Other abnormal glucose: Secondary | ICD-10-CM

## 2011-03-27 DIAGNOSIS — Z0000 Encounter for general adult medical examination without abnormal findings: Secondary | ICD-10-CM

## 2011-03-27 DIAGNOSIS — I10 Essential (primary) hypertension: Secondary | ICD-10-CM

## 2011-03-27 DIAGNOSIS — D126 Benign neoplasm of colon, unspecified: Secondary | ICD-10-CM

## 2011-03-27 DIAGNOSIS — Z86718 Personal history of other venous thrombosis and embolism: Secondary | ICD-10-CM

## 2011-03-27 DIAGNOSIS — Z86010 Personal history of colonic polyps: Secondary | ICD-10-CM

## 2011-03-27 DIAGNOSIS — K219 Gastro-esophageal reflux disease without esophagitis: Secondary | ICD-10-CM

## 2011-03-27 DIAGNOSIS — E782 Mixed hyperlipidemia: Secondary | ICD-10-CM

## 2011-03-27 LAB — CBC WITH DIFFERENTIAL/PLATELET
Basophils Absolute: 0 10*3/uL (ref 0.0–0.1)
Basophils Relative: 0.4 % (ref 0.0–3.0)
Eosinophils Absolute: 0.1 10*3/uL (ref 0.0–0.7)
Eosinophils Relative: 1.4 % (ref 0.0–5.0)
HCT: 42.9 % (ref 36.0–46.0)
Hemoglobin: 14.6 g/dL (ref 12.0–15.0)
Lymphocytes Relative: 36.9 % (ref 12.0–46.0)
Lymphs Abs: 2.1 10*3/uL (ref 0.7–4.0)
MCHC: 33.9 g/dL (ref 30.0–36.0)
MCV: 88.4 fl (ref 78.0–100.0)
Monocytes Absolute: 0.4 10*3/uL (ref 0.1–1.0)
Monocytes Relative: 6.2 % (ref 3.0–12.0)
Neutro Abs: 3.1 10*3/uL (ref 1.4–7.7)
Neutrophils Relative %: 55.1 % (ref 43.0–77.0)
Platelets: 230 10*3/uL (ref 150.0–400.0)
RBC: 4.85 Mil/uL (ref 3.87–5.11)
RDW: 13.4 % (ref 11.5–14.6)
WBC: 5.6 10*3/uL (ref 4.5–10.5)

## 2011-03-27 LAB — HEPATIC FUNCTION PANEL
ALT: 12 U/L (ref 0–35)
AST: 22 U/L (ref 0–37)
Albumin: 4.2 g/dL (ref 3.5–5.2)
Alkaline Phosphatase: 89 U/L (ref 39–117)
Bilirubin, Direct: 0.2 mg/dL (ref 0.0–0.3)
Total Bilirubin: 0.6 mg/dL (ref 0.3–1.2)
Total Protein: 7.8 g/dL (ref 6.0–8.3)

## 2011-03-27 LAB — BASIC METABOLIC PANEL WITH GFR
BUN: 13 mg/dL (ref 6–23)
CO2: 27 meq/L (ref 19–32)
Calcium: 9.5 mg/dL (ref 8.4–10.5)
Chloride: 104 meq/L (ref 96–112)
Creatinine, Ser: 0.7 mg/dL (ref 0.4–1.2)
GFR: 89.51 mL/min
Glucose, Bld: 106 mg/dL — ABNORMAL HIGH (ref 70–99)
Potassium: 4 meq/L (ref 3.5–5.1)
Sodium: 140 meq/L (ref 135–145)

## 2011-03-27 LAB — LIPID PANEL
Cholesterol: 199 mg/dL (ref 0–200)
HDL: 69 mg/dL
LDL Cholesterol: 101 mg/dL — ABNORMAL HIGH (ref 0–99)
Total CHOL/HDL Ratio: 3
Triglycerides: 144 mg/dL (ref 0.0–149.0)
VLDL: 28.8 mg/dL (ref 0.0–40.0)

## 2011-03-27 LAB — TSH: TSH: 1.35 u[IU]/mL (ref 0.35–5.50)

## 2011-03-27 LAB — HEMOGLOBIN A1C: Hgb A1c MFr Bld: 5.3 % (ref 4.6–6.5)

## 2011-03-27 NOTE — Patient Instructions (Signed)
Preventive Health Care: Exercise  30-45  minutes a day, 3-4 days a week. Water aerobics may be option with knee issues. Eat a low-fat diet with lots of fruits and vegetables, up to 7-9 servings per day. Avoid obesity; your goal = waist less than 35 inches.Consume less than 30 grams of sugar per day from foods & drinks with High Fructose Corn Syrup as # 1,2,3 or #4 on label. Eye Doctor - have an eye exam @ least annually Health Care Power of Attorney  places  you in charge of your health care  decisions. Verify this are  in place. As per the Standard of Care , screening Colonoscopy recommended @ 50 & every 5-10 years thereafter . More frequent monitor would be dictated by family history or findings @ Colonoscopy . Please check with Dr Jarold Motto when it is due

## 2011-03-27 NOTE — Progress Notes (Signed)
Subjective:    Patient ID: Bonnie Zhang, female    DOB: 1941/02/25, 70 y.o.   MRN: [id number]  HPI Medicare Wellness Visit:  The following psychosocial & medical history were reviewed as required by Medicare.   Social history: caffeine: minimal , alcohol:  no ,  tobacco use : never  & exercise :not since 6/12;Dr Thurston Hole, Orthopedist treating DJD of knees Home & personal  safety / fall risk: no issues, activities of daily living: no limitations , seatbelt use : yes , and smoke alarm employment : yes .  Power of Attorney/Living Will status : no HCPOA  Vision ( as recorded per Nurse) & Hearing  evaluation :  Ophth exam 2011.Wall chart read & whisper heard  @ 6 ft. Orientation :oriented x3 , memory & recall :good spelling or math testing: good,and mood & affect : normal . Depression / anxiety: no issues Travel history : never , immunization status :does not take shots (standards reviewed) , transfusion history:  no, and preventive health surveillance ( colonoscopies, BMD , etc as per protocol/ [facility]): colonoscopy ? 2007: ? polyp, Dental care:  Seen every 6 months . Chart reviewed &  Updated. Active issues reviewed & addressed.       Review of Systems HYPERTENSION: Disease Monitoring: Blood pressure range-128-130/70-72  Chest pain, palpitations- no       Dyspnea- no Medications: Compliance- yes  Lightheadedness,Syncope- no    Edema- only after standing for prolonged periods  FASTING HYPERGLYCEMIA:PMH of, A1c always WNL Polyuria/phagia/dipsia- no       Visual problems- no  HYPERLIPIDEMIA: Disease Monitoring: See symptoms for Hypertension Medications: Compliance- yes  Abd pain, bowel changes- no   Muscle aches- no , but significant bilateral knee pain despite steroids & "joint fluid therapy"        Objective:   Physical Exam Gen.: Healthy and well-nourished in appearance. Alert, appropriate and cooperative throughout exam. Head: Normocephalic without obvious abnormalities;   Hair fine  Eyes: No corneal or conjunctival inflammation noted. Pupils equal round reactive to light and accommodation.  Extraocular motion intact. Ears: External  ear exam reveals no significant lesions or deformities. Canals clear .TMs normal.  Nose: External nasal exam reveals no deformity or inflammation. Nasal mucosa are pink and moist. No lesions or exudates noted. Septum  Slightly dislocated to L  Mouth: Oral mucosa and oropharynx reveal no lesions or exudates. Teeth in good repair. Neck: No deformities, masses, or tenderness noted. Range of motion &. Thyroid normal. Lungs: Normal respiratory effort; chest expands symmetrically. Lungs are clear to auscultation without rales, wheezes, or increased work of breathing. Heart: Normal rate and rhythm. Normal S1 and S2. No gallop, click, or rub. S4 w/o  murmur. Abdomen: Bowel sounds normal; abdomen soft and nontender. No masses, organomegaly or hernias noted. Genitalia: Dr Shawnie Pons   .                                                                                   Musculoskeletal/extremities:Mild lordosis noted of  the thoracic spine. No clubbing, cyanosis, edema,  noted. Range of motion decreased @ knees; marked crepitus, R > L.Tone & strength  normal.Joints :mild  finger changes  of arthritis. Nail health  good. Vascular: Carotid, radial artery, dorsalis pedis and  posterior tibial pulses are full and equal. No bruits present. Neurologic: Alert and oriented x3. Deep tendon reflexes symmetrical and normal.          Skin: Intact without suspicious lesions or rashes. Lymph: No cervical, axillary lymphadenopathy present. Psych: Mood and affect are normal. Normally interactive                                                                                        Assessment & Plan:  #1 Medicare Wellness Exam; criteria met ; data entered #2 Problem List reviewed ; Assessment/ Recommendations made Plan: see Orders

## 2011-03-27 NOTE — Assessment & Plan Note (Signed)
BP well controlled @ home. Chemistries will be checked.

## 2011-03-28 LAB — VITAMIN D 25 HYDROXY (VIT D DEFICIENCY, FRACTURES): Vit D, 25-Hydroxy: 43 ng/mL (ref 30–89)

## 2011-03-30 ENCOUNTER — Other Ambulatory Visit: Payer: Self-pay | Admitting: Internal Medicine

## 2011-04-03 ENCOUNTER — Ambulatory Visit (INDEPENDENT_AMBULATORY_CARE_PROVIDER_SITE_OTHER): Payer: Medicare Other | Admitting: *Deleted

## 2011-04-03 ENCOUNTER — Telehealth: Payer: Self-pay

## 2011-04-03 DIAGNOSIS — Z86718 Personal history of other venous thrombosis and embolism: Secondary | ICD-10-CM

## 2011-04-03 DIAGNOSIS — Z7901 Long term (current) use of anticoagulants: Secondary | ICD-10-CM

## 2011-04-03 LAB — POCT INR: INR: 2.3

## 2011-04-03 NOTE — Telephone Encounter (Signed)
Message copied by Maurice Small on Thu Apr 03, 2011  4:22 PM ------      Message from: Pecola Lawless      Created: Mon Mar 31, 2011  9:34 AM       Her  screening Colonoscopy recommended is due 01/14 as per Dr Jarold Motto

## 2011-04-03 NOTE — Telephone Encounter (Signed)
Patient informed of next due date for colonoscopy

## 2011-05-15 ENCOUNTER — Ambulatory Visit (INDEPENDENT_AMBULATORY_CARE_PROVIDER_SITE_OTHER): Payer: Medicare Other | Admitting: *Deleted

## 2011-05-15 DIAGNOSIS — Z7901 Long term (current) use of anticoagulants: Secondary | ICD-10-CM

## 2011-05-15 DIAGNOSIS — Z86718 Personal history of other venous thrombosis and embolism: Secondary | ICD-10-CM

## 2011-05-15 LAB — POCT INR: INR: 2.4

## 2011-06-02 ENCOUNTER — Other Ambulatory Visit: Payer: Self-pay | Admitting: Internal Medicine

## 2011-06-02 NOTE — Telephone Encounter (Signed)
Prescription sent to pharmacy.

## 2011-06-22 ENCOUNTER — Other Ambulatory Visit: Payer: Self-pay | Admitting: Cardiology

## 2011-06-26 ENCOUNTER — Ambulatory Visit (INDEPENDENT_AMBULATORY_CARE_PROVIDER_SITE_OTHER): Payer: Medicare Other | Admitting: *Deleted

## 2011-06-26 DIAGNOSIS — Z7901 Long term (current) use of anticoagulants: Secondary | ICD-10-CM

## 2011-06-26 DIAGNOSIS — Z86718 Personal history of other venous thrombosis and embolism: Secondary | ICD-10-CM

## 2011-06-26 LAB — POCT INR: INR: 3.1

## 2011-07-14 ENCOUNTER — Ambulatory Visit: Payer: Medicare Other | Admitting: Internal Medicine

## 2011-07-14 ENCOUNTER — Encounter: Payer: Self-pay | Admitting: Internal Medicine

## 2011-07-14 ENCOUNTER — Ambulatory Visit (INDEPENDENT_AMBULATORY_CARE_PROVIDER_SITE_OTHER): Payer: Medicare Other | Admitting: Internal Medicine

## 2011-07-14 VITALS — BP 118/72 | HR 72 | Temp 97.7°F | Resp 12 | Ht 60.08 in | Wt 177.6 lb

## 2011-07-14 DIAGNOSIS — K219 Gastro-esophageal reflux disease without esophagitis: Secondary | ICD-10-CM

## 2011-07-14 DIAGNOSIS — Z86718 Personal history of other venous thrombosis and embolism: Secondary | ICD-10-CM

## 2011-07-14 DIAGNOSIS — E8881 Metabolic syndrome: Secondary | ICD-10-CM

## 2011-07-14 DIAGNOSIS — I10 Essential (primary) hypertension: Secondary | ICD-10-CM

## 2011-07-14 DIAGNOSIS — M810 Age-related osteoporosis without current pathological fracture: Secondary | ICD-10-CM

## 2011-07-14 DIAGNOSIS — E782 Mixed hyperlipidemia: Secondary | ICD-10-CM

## 2011-07-14 DIAGNOSIS — T887XXA Unspecified adverse effect of drug or medicament, initial encounter: Secondary | ICD-10-CM | POA: Insufficient documentation

## 2011-07-14 NOTE — Progress Notes (Signed)
Subjective:    Patient ID: Bonnie Zhang, female    DOB: 10/28/1940, 71 y.o.   MRN: [id number]  HPI She is scheduled for right total knee replacement 08/11/11 by Dr. Salvatore Marvel. She has end-stage degenerative disease of the knee with intractable pain which compromises ambulation. In the last decade she is at least a cortisone injections with only temporary response. She's also had "fluid therapy" with no benefit.  Most significantly she has been on warfarin since her second deep venous thrombosis. Her initial deep venous thrombosis was following a C-section. Subsequent to that she had deep venous thrombosis in the context of prolonged driving while on hormonal replacement therapy. She is followed at the Coumadin clinic    Review of Systems Significant  Past medical history is reviewed as follows: #1 HYPERTENSION: Disease Monitoring: Blood pressure range-128-30/75  Chest pain, palpitations- no       Dyspnea- no Medications: Compliance- yes  Lightheadedness,Syncope- no    Edema- only in leg affected by DVT  # 2 FASTING HYPERGLYCEMIA, PMH of: Polyuria/phagia/dipsia- no       Visual problems- no Hypoglycemic symptoms- no   #3 HYPERLIPIDEMIA: Disease Monitoring: See symptoms for Hypertension Medications: Compliance-yes  Abd pain, bowel changes- no   Muscle aches- no  #4 GERD: She denies persistent hoarseness, difficulty swallowing, unexplained weight loss, significant dyspepsia, abdominal pain, melena, or rectal bleeding. She takes TUMS as needed. She is not on a PPI.           Objective:   Physical Exam Gen.: Healthy and well-nourished in appearance. Alert, appropriate and cooperative throughout exam.  Eyes: No corneal or conjunctival inflammation noted.Slight ptosis OD. Nose: External nasal exam reveals no deformity or inflammation. Nasal mucosa are pink and moist. No lesions or exudates noted. Septum ;slight dislocation  Mouth: Oral mucosa and oropharynx reveal no  lesions, erythema or exudates. Teeth in good repair. Neck: No deformities, masses, or tenderness noted. Range of motion & Thyroid normal Lungs: Normal respiratory effort; chest expands symmetrically. Lungs are clear to auscultation without rales, wheezes, or increased work of breathing. Heart: Normal rate and rhythm. Normal S1 and S2. No gallop, click, or rub. S4 with slurring ; no   murmur. Abdomen: Bowel sounds normal; abdomen soft and nontender. No masses, organomegaly or hernias noted.  Musculoskeletal/extremities: Slight lordosis noted of  the thoracic  spine. No clubbing, cyanosis, edema,  Noted. Nail health  good. She limps on the right knee. There is fusiform change with crepitus and decreased range of motion in the right knee greater than the left. Vascular: Carotid, radial artery, dorsalis pedis and  posterior tibial pulses are full and equal. No bruits present. Neurologic: Alert and oriented x3.  Skin: Intact without suspicious lesions or rashes. Lymph: No cervical, axillary lymphadenopathy present. Psych: Mood and affect are normal. 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 See problem list. She is medically cleared for surgery  Plan: Coumadin clinic should manage the perioperative switch from warfarin to Lovenox and back.

## 2011-07-14 NOTE — Patient Instructions (Addendum)
You are medically cleared for surgery Coumadin clinic will manage the transition from warfarin to Lovenox perioperatively and then restarting the warfarin postoperatively when okayed by Dr Thurston Hole, Orthopedic surgeon.

## 2011-07-24 ENCOUNTER — Ambulatory Visit (INDEPENDENT_AMBULATORY_CARE_PROVIDER_SITE_OTHER): Payer: Medicare Other | Admitting: Pharmacist

## 2011-07-24 DIAGNOSIS — Z7901 Long term (current) use of anticoagulants: Secondary | ICD-10-CM

## 2011-07-24 DIAGNOSIS — Z86718 Personal history of other venous thrombosis and embolism: Secondary | ICD-10-CM

## 2011-07-24 LAB — POCT INR: INR: 2.8

## 2011-07-24 MED ORDER — ENOXAPARIN SODIUM 120 MG/0.8ML ~~LOC~~ SOLN: 120.0000 mg | Freq: Every day | SUBCUTANEOUS | Status: DC

## 2011-07-24 NOTE — Patient Instructions (Signed)
5/7- Take last dose of Coumadin 5/8- No Coumadin or Lovenox 5/9- Start Lovenox 120mg  injection once in AM 5/10- Lovenox 120mg  injection in AM 5/11- Lovenox 120mg  injection in AM 5/12- Lovenox 120mg  injection in AM 5/13- Day of Procedure.  DO NOT take any Lovenox prior to your procedure.  We will follow up with you once you are discharged from [facility].

## 2011-07-28 ENCOUNTER — Encounter (HOSPITAL_COMMUNITY): Payer: Self-pay

## 2011-07-29 ENCOUNTER — Encounter: Payer: Self-pay | Admitting: Physician Assistant

## 2011-07-29 ENCOUNTER — Other Ambulatory Visit: Payer: Self-pay | Admitting: Physician Assistant

## 2011-07-29 DIAGNOSIS — I739 Peripheral vascular disease, unspecified: Secondary | ICD-10-CM | POA: Insufficient documentation

## 2011-07-29 DIAGNOSIS — E7800 Pure hypercholesterolemia, unspecified: Secondary | ICD-10-CM | POA: Insufficient documentation

## 2011-07-29 DIAGNOSIS — I10 Essential (primary) hypertension: Secondary | ICD-10-CM

## 2011-07-29 DIAGNOSIS — O2230 Deep phlebothrombosis in pregnancy, unspecified trimester: Secondary | ICD-10-CM

## 2011-07-29 DIAGNOSIS — K219 Gastro-esophageal reflux disease without esophagitis: Secondary | ICD-10-CM | POA: Insufficient documentation

## 2011-07-29 DIAGNOSIS — E559 Vitamin D deficiency, unspecified: Secondary | ICD-10-CM | POA: Insufficient documentation

## 2011-07-29 DIAGNOSIS — I82409 Acute embolism and thrombosis of unspecified deep veins of unspecified lower extremity: Secondary | ICD-10-CM | POA: Insufficient documentation

## 2011-07-29 DIAGNOSIS — M1711 Unilateral primary osteoarthritis, right knee: Secondary | ICD-10-CM | POA: Insufficient documentation

## 2011-07-29 NOTE — H&P (Signed)
Bonnie Zhang is an 70 y.o. female.   Chief Complaint: right knee DJD HPI: Bonnie Zhang is a 71 year old seen for follow-up from her bilateral knee degenerative joint disease right worse than left getting progressively worse. She had a trial of Supartz injections the last done on 02/04/11 but this helped minimally. She has pain on a daily basis, pain with weightbearing and activity relieved by rest. She sees Dr. Hopper for her medical care. 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  The excruciating pain is getting progressively worse, limiting activities of daily living, poses a significant fall risk, and has failed multiple conservative treatments including intraarticular cortisone injections, intraarticular Supartz, bracing, medication and home physical therapy.  Past Medical History  Diagnosis Date  . Hypertension   . High cholesterol   . Vitamin d deficiency   . DVT (deep vein thrombosis) in pregnancy      1966 post C section;2001 with prolonged driving while on  HRT  . GERD (gastroesophageal reflux disease)   . PVD (peripheral vascular disease)     abnormal venous doppler findings due to recurrent DVT'S  . Osteoporosis   . Right knee DJD     Past Surgical History  Procedure Date  . Cesarean section     X3  . Breast lumpectomy   . Cholecystectomy, laparoscopic   . Colonoscopy w/ polypectomy 2007    Dr  Patterson; due? 2014  . Dilation and curettage of uterus 2012    uterine polyp, Dr Pratt    Family History  Problem Relation Age of Onset  . Kidney disease Mother   . Hypertension Mother   . Leukemia Father   . Cancer Maternal Grandmother     COLON  . Diabetes Other     cousins  . Deep vein thrombosis Mother     post ankle fracture   Social History:  reports that she has never smoked. She has never used smokeless tobacco. She reports that she does not drink alcohol or use illicit drugs.  Allergies:  Allergies  Allergen Reactions    . Aspirin     REACTION: upset stomach  . Atorvastatin     REACTION: leg pain  . Hctz (Hydrochlorothiazide) Other (See Comments)    FATIGUE AND EXTREMELY LOW POTASSIUM     (Not in a hospital admission)  No results found for this or any previous visit (from the past 48 hour(s)). No results found.  Review of Systems  Constitutional: Negative.   HENT: Negative.   Eyes: Negative.   Respiratory: Negative.   Cardiovascular: Negative.   Gastrointestinal: Negative.   Genitourinary: Negative.   Musculoskeletal: Positive for joint pain.       Bilateral knee pain right worse than left  Skin: Negative.   Neurological: Negative.   Endo/Heme/Allergies: Bruises/bleeds easily.  Psychiatric/Behavioral: Negative.     Blood pressure 141/85, pulse 82, temperature 98.1 F (36.7 C), SpO2 96.00%. Physical Exam  Constitutional: She is oriented to person, place, and time. She appears well-developed and well-nourished.  HENT:  Head: Normocephalic and atraumatic.  Mouth/Throat: Oropharynx is clear and moist.  Eyes: EOM are normal. Pupils are equal, round, and reactive to light.  Neck: Normal range of motion. Neck supple.  Cardiovascular: Normal rate and regular rhythm.   Respiratory: Effort normal and breath sounds normal.  GI: Soft. Bowel sounds are normal.  Genitourinary:       Not pertinent to current symptomatology therefore   not examined.  Musculoskeletal:        Examination of both knees reveals moderate varus deformity bilaterally, 1+ crepitation 1+ synovitis range of motion 0-120 degrees knees are stable with normal patella tracking. Vascular exam: pulses 2+ and symmetric. Moderately antalgic gait no assistive devices  Neurological: She is alert and oriented to person, place, and time.  Skin: Skin is warm and dry.  Psychiatric: She has a normal mood and affect. Her behavior is normal. Judgment and thought content normal.     XRAY:  AP,LATERAL, FLEXION, and SUNRISE views show  significant joint space narrowing, with periarticular osteophytes, and subchondral sclerosis.  Assessment Patient Active Problem List  Diagnoses  . VITAMIN D DEFICIENCY  . HYPERLIPIDEMIA  . Dysmetabolic syndrome X  . HYPERTENSION, ESSENTIAL NOS  . GERD  . DVT, HX OF  . COLONIC POLYPS, HX OF  . Long term current use of anticoagulant  . OSTEOPOROSIS  . Postmenopausal bleeding  . Unspecified adverse effect of unspecified drug, medicinal and biological substance  . Hypertension  . High cholesterol  . Vitamin d deficiency  . DVT (deep vein thrombosis) in pregnancy  . GERD (gastroesophageal reflux disease)  . PVD (peripheral vascular disease)  . Right knee DJD    Plan I talk to her about this in detail. I recommend with her significant pain and lack of response to conservative care that we proceed with right total knee replacement. Discussed risks benefits and possible complications of the surgery in detail and she understands this completely.   Daenerys Buttram J 07/29/2011, 3:59 PM    

## 2011-07-31 ENCOUNTER — Other Ambulatory Visit: Payer: Self-pay | Admitting: Internal Medicine

## 2011-07-31 NOTE — Telephone Encounter (Signed)
Refill done.  

## 2011-08-07 ENCOUNTER — Encounter (HOSPITAL_COMMUNITY): Payer: Self-pay

## 2011-08-07 ENCOUNTER — Other Ambulatory Visit: Payer: Self-pay | Admitting: Physician Assistant

## 2011-08-07 ENCOUNTER — Encounter (HOSPITAL_COMMUNITY)
Admission: RE | Admit: 2011-08-07 | Discharge: 2011-08-07 | Disposition: A | Discharge status: Home / Self Care | Payer: Medicare Other | Source: Ambulatory Visit | Attending: Physician Assistant | Admitting: Physician Assistant

## 2011-08-07 ENCOUNTER — Encounter (HOSPITAL_COMMUNITY)
Admission: RE | Admit: 2011-08-07 | Discharge: 2011-08-07 | Disposition: A | Discharge status: Home / Self Care | Payer: Medicare Other | Source: Ambulatory Visit | Attending: Orthopedic Surgery | Admitting: Orthopedic Surgery

## 2011-08-07 HISTORY — DX: Personal history of colonic polyps: Z86.010

## 2011-08-07 HISTORY — DX: Spontaneous ecchymoses: R23.3

## 2011-08-07 HISTORY — DX: Personal history of other diseases of the nervous system and sense organs: Z86.69

## 2011-08-07 HISTORY — DX: Other skin changes: R23.8

## 2011-08-07 LAB — URINALYSIS, ROUTINE W REFLEX MICROSCOPIC
Bilirubin Urine: NEGATIVE
Glucose, UA: NEGATIVE mg/dL
Ketones, ur: NEGATIVE mg/dL
Leukocytes, UA: NEGATIVE
Nitrite: NEGATIVE
Protein, ur: NEGATIVE mg/dL
Specific Gravity, Urine: 1.018 (ref 1.005–1.030)
Urobilinogen, UA: 1 mg/dL (ref 0.0–1.0)
pH: 6 (ref 5.0–8.0)

## 2011-08-07 LAB — DIFFERENTIAL
Basophils Absolute: 0 10*3/uL (ref 0.0–0.1)
Basophils Relative: 0 % (ref 0–1)
Eosinophils Absolute: 0.1 10*3/uL (ref 0.0–0.7)
Eosinophils Relative: 2 % (ref 0–5)
Lymphocytes Relative: 48 % — ABNORMAL HIGH (ref 12–46)
Lymphs Abs: 3.4 10*3/uL (ref 0.7–4.0)
Monocytes Absolute: 0.4 10*3/uL (ref 0.1–1.0)
Monocytes Relative: 6 % (ref 3–12)
Neutro Abs: 3.2 10*3/uL (ref 1.7–7.7)
Neutrophils Relative %: 45 % (ref 43–77)

## 2011-08-07 LAB — COMPREHENSIVE METABOLIC PANEL
ALT: 16 U/L (ref 0–35)
AST: 22 U/L (ref 0–37)
Albumin: 4.1 g/dL (ref 3.5–5.2)
Alkaline Phosphatase: 96 U/L (ref 39–117)
BUN: 12 mg/dL (ref 6–23)
CO2: 25 mEq/L (ref 19–32)
Calcium: 9.9 mg/dL (ref 8.4–10.5)
Chloride: 99 mEq/L (ref 96–112)
Creatinine, Ser: 0.67 mg/dL (ref 0.50–1.10)
GFR calc Af Amer: >90 mL/min (ref >90)
GFR calc non Af Amer: 88 mL/min — ABNORMAL LOW (ref >90)
Glucose, Bld: 97 mg/dL (ref 70–99)
Potassium: 3.6 mEq/L (ref 3.5–5.1)
Sodium: 139 mEq/L (ref 135–145)
Total Bilirubin: 0.4 mg/dL (ref 0.3–1.2)
Total Protein: 7.9 g/dL (ref 6.0–8.3)

## 2011-08-07 LAB — SURGICAL PCR SCREEN
MRSA, PCR: NEGATIVE
Staphylococcus aureus: NEGATIVE

## 2011-08-07 LAB — URINE MICROSCOPIC-ADD ON

## 2011-08-07 LAB — CBC
HCT: 45.3 % (ref 36.0–46.0)
Hemoglobin: 15.1 g/dL — ABNORMAL HIGH (ref 12.0–15.0)
MCH: 29.6 pg (ref 26.0–34.0)
MCHC: 33.3 g/dL (ref 30.0–36.0)
MCV: 88.8 fL (ref 78.0–100.0)
Platelets: 201 10*3/uL (ref 150–400)
RBC: 5.1 MIL/uL (ref 3.87–5.11)
RDW: 13 % (ref 11.5–15.5)
WBC: 7.1 10*3/uL (ref 4.0–10.5)

## 2011-08-07 LAB — ABO/RH: ABO/RH(D): A POS

## 2011-08-07 LAB — TYPE AND SCREEN
ABO/RH(D): A POS
Antibody Screen: NEGATIVE

## 2011-08-07 LAB — PROTIME-INR
INR: 1.96 — ABNORMAL HIGH (ref 0.00–1.49)
Prothrombin Time: 22.7 seconds — ABNORMAL HIGH (ref 11.6–15.2)

## 2011-08-07 LAB — APTT: aPTT: 49 seconds — ABNORMAL HIGH (ref 24–37)

## 2011-08-07 MED ORDER — CHLORHEXIDINE GLUCONATE 4 % EX LIQD: 60.0000 mL | Freq: Once | CUTANEOUS | Status: DC

## 2011-08-07 NOTE — Pre-Procedure Instructions (Signed)
20 Bonnie Zhang  08/07/2011   Your procedure is scheduled on: Mon, May 13 @ 7:30 AM  Report to [facility] at 5:30 AM.  Call this number if you have problems the morning of surgery: [phone]   Remember:   Do not eat food:After Midnight.  May have clear liquids:until 4 hrs prior to arrival(until 1:30 am)  Clear liquids include soda, tea, black coffee, apple or grape juice, broth,water  Take these medicines the morning of surgery with A SIP OF WATER: Amlodipine and Metoprolol   Do not wear jewelry, make-up or nail polish.  Do not wear lotions, powders, or perfumes.   Do not shave 48 hours prior to surgery.  Do not bring valuables to the hospital.  Contacts, dentures or bridgework may not be worn into surgery.  Leave suitcase in the car. After surgery it may be brought to your room.  For patients admitted to the hospital, checkout time is 11:00 AM the day of discharge.   Special Instructions: Incentive Spirometry - Practice and bring it with you on the day of surgery. and CHG Shower Use Special Wash: 1/2 bottle night before surgery and 1/2 bottle morning of surgery.   Please read over the following fact sheets that you were given: Pain Booklet, Coughing and Deep Breathing, Blood Transfusion Information, Total Joint Packet, MRSA Information and Surgical Site Infection Prevention

## 2011-08-07 NOTE — Progress Notes (Signed)
Pt doesn't have a cardiologist now-saw one about 10+yrs ago  Dr.William Hopper-is medical MD-report in epic from 07/14/11 that she is medically cleared  At [facility] q4wks prn  Stress test done in 2001  Denies ever having an echo/heart cath

## 2011-08-08 LAB — URINE CULTURE
Colony Count: >=100000
Culture  Setup Time: 201305091846

## 2011-08-08 NOTE — Progress Notes (Signed)
The abnormal PT/INR should be reported to Dr. Thurston Hole for management.

## 2011-08-10 MED ORDER — CEFAZOLIN SODIUM-DEXTROSE 2-3 GM-% IV SOLR
2.0000 g | INTRAVENOUS | Status: AC
  Administered 2011-08-11: 2 g via INTRAVENOUS

## 2011-08-11 ENCOUNTER — Encounter (HOSPITAL_COMMUNITY)
Admission: RE | Disposition: A | Discharge status: Skilled Nursing Facility | Payer: Self-pay | Source: Ambulatory Visit | Attending: Orthopedic Surgery

## 2011-08-11 ENCOUNTER — Inpatient Hospital Stay (HOSPITAL_COMMUNITY)
Admission: RE | Admit: 2011-08-11 | Discharge: 2011-08-13 | DRG: 470 | Disposition: A | Discharge status: Skilled Nursing Facility | Payer: Medicare Other | Source: Ambulatory Visit | Attending: Orthopedic Surgery | Admitting: Orthopedic Surgery

## 2011-08-11 ENCOUNTER — Ambulatory Visit (HOSPITAL_COMMUNITY): Payer: Medicare Other | Admitting: Anesthesiology

## 2011-08-11 ENCOUNTER — Encounter (HOSPITAL_COMMUNITY): Payer: Self-pay | Admitting: Anesthesiology

## 2011-08-11 ENCOUNTER — Encounter (HOSPITAL_COMMUNITY): Payer: Self-pay

## 2011-08-11 DIAGNOSIS — Z86718 Personal history of other venous thrombosis and embolism: Secondary | ICD-10-CM | POA: Diagnosis present

## 2011-08-11 DIAGNOSIS — I10 Essential (primary) hypertension: Secondary | ICD-10-CM | POA: Diagnosis present

## 2011-08-11 DIAGNOSIS — Z860100 Personal history of colon polyps, unspecified: Secondary | ICD-10-CM

## 2011-08-11 DIAGNOSIS — I739 Peripheral vascular disease, unspecified: Secondary | ICD-10-CM | POA: Diagnosis present

## 2011-08-11 DIAGNOSIS — K219 Gastro-esophageal reflux disease without esophagitis: Secondary | ICD-10-CM | POA: Diagnosis present

## 2011-08-11 DIAGNOSIS — Z01812 Encounter for preprocedural laboratory examination: Secondary | ICD-10-CM

## 2011-08-11 DIAGNOSIS — M1711 Unilateral primary osteoarthritis, right knee: Secondary | ICD-10-CM | POA: Diagnosis present

## 2011-08-11 DIAGNOSIS — D62 Acute posthemorrhagic anemia: Secondary | ICD-10-CM

## 2011-08-11 DIAGNOSIS — M1710 Unilateral primary osteoarthritis, unspecified knee: Principal | ICD-10-CM | POA: Diagnosis present

## 2011-08-11 DIAGNOSIS — M810 Age-related osteoporosis without current pathological fracture: Secondary | ICD-10-CM | POA: Diagnosis present

## 2011-08-11 HISTORY — PX: TOTAL KNEE ARTHROPLASTY: SHX125

## 2011-08-11 LAB — APTT: aPTT: 26 seconds (ref 24–37)

## 2011-08-11 LAB — PROTIME-INR
INR: 1 (ref 0.00–1.49)
Prothrombin Time: 13.4 seconds (ref 11.6–15.2)

## 2011-08-11 SURGERY — ARTHROPLASTY, KNEE, TOTAL
Anesthesia: General | Site: Knee | Laterality: Right | Wound class: Clean

## 2011-08-11 MED ORDER — PROPOFOL 10 MG/ML IV EMUL
INTRAVENOUS | Status: DC | PRN
  Administered 2011-08-11: 120 mg via INTRAVENOUS

## 2011-08-11 MED ORDER — SIMVASTATIN 20 MG PO TABS
20.0000 mg | ORAL_TABLET | Freq: Every day | ORAL | Status: DC
  Administered 2011-08-11: 20 mg via ORAL
  Filled 2011-08-11 (×3): qty 1

## 2011-08-11 MED ORDER — POVIDONE-IODINE 7.5 % EX SOLN
Freq: Once | CUTANEOUS | Status: DC
  Filled 2011-08-11: qty 118

## 2011-08-11 MED ORDER — AMLODIPINE BESYLATE 5 MG PO TABS
5.0000 mg | ORAL_TABLET | Freq: Every day | ORAL | Status: DC
  Administered 2011-08-12: 5 mg via ORAL
  Filled 2011-08-11 (×2): qty 1

## 2011-08-11 MED ORDER — ONDANSETRON HCL 4 MG PO TABS: 4.0000 mg | ORAL_TABLET | Freq: Four times a day (QID) | ORAL | Status: DC | PRN

## 2011-08-11 MED ORDER — POTASSIUM CHLORIDE IN NACL 20-0.9 MEQ/L-% IV SOLN
INTRAVENOUS | Status: DC
  Administered 2011-08-11: 13:00:00 via INTRAVENOUS
  Filled 2011-08-11 (×8): qty 1000

## 2011-08-11 MED ORDER — OXYCODONE HCL 5 MG PO TABS
5.0000 mg | ORAL_TABLET | ORAL | Status: DC | PRN
  Filled 2011-08-11: qty 2

## 2011-08-11 MED ORDER — LIDOCAINE HCL (CARDIAC) 20 MG/ML IV SOLN
INTRAVENOUS | Status: DC | PRN
  Administered 2011-08-11: 60 mg via INTRAVENOUS

## 2011-08-11 MED ORDER — WARFARIN - PHARMACIST DOSING INPATIENT: Freq: Every day | Status: DC

## 2011-08-11 MED ORDER — HYDROMORPHONE HCL PF 1 MG/ML IJ SOLN
INTRAMUSCULAR | Status: AC
  Filled 2011-08-11: qty 1

## 2011-08-11 MED ORDER — BUPIVACAINE-EPINEPHRINE PF 0.5-1:200000 % IJ SOLN
INTRAMUSCULAR | Status: DC | PRN
  Administered 2011-08-11: 30 mL

## 2011-08-11 MED ORDER — HYDROMORPHONE HCL PF 1 MG/ML IJ SOLN
0.2500 mg | INTRAMUSCULAR | Status: DC | PRN
  Administered 2011-08-11 (×4): 0.5 mg via INTRAVENOUS

## 2011-08-11 MED ORDER — NEOSTIGMINE METHYLSULFATE 1 MG/ML IJ SOLN
INTRAMUSCULAR | Status: DC | PRN
  Administered 2011-08-11: 4 mg via INTRAVENOUS

## 2011-08-11 MED ORDER — ONDANSETRON HCL 4 MG/2ML IJ SOLN
INTRAMUSCULAR | Status: DC | PRN
  Administered 2011-08-11: 4 mg via INTRAVENOUS

## 2011-08-11 MED ORDER — BISACODYL 5 MG PO TBEC
10.0000 mg | DELAYED_RELEASE_TABLET | Freq: Every day | ORAL | Status: DC
  Administered 2011-08-12: 10 mg via ORAL
  Filled 2011-08-11: qty 2

## 2011-08-11 MED ORDER — WARFARIN SODIUM 5 MG PO TABS
5.0000 mg | ORAL_TABLET | Freq: Once | ORAL | Status: AC
  Administered 2011-08-11: 5 mg via ORAL
  Filled 2011-08-11: qty 1

## 2011-08-11 MED ORDER — OXYCODONE-ACETAMINOPHEN 5-325 MG PO TABS
1.0000 | ORAL_TABLET | ORAL | Status: DC | PRN
  Administered 2011-08-11 – 2011-08-13 (×7): 2 via ORAL
  Filled 2011-08-11 (×8): qty 2

## 2011-08-11 MED ORDER — WARFARIN SODIUM 2.5 MG PO TABS: 2.5000 mg | ORAL_TABLET | Freq: Every day | ORAL | Status: DC

## 2011-08-11 MED ORDER — BENAZEPRIL HCL 10 MG PO TABS
10.0000 mg | ORAL_TABLET | Freq: Every day | ORAL | Status: DC
  Administered 2011-08-12: 10 mg via ORAL
  Filled 2011-08-11 (×2): qty 1

## 2011-08-11 MED ORDER — CEFAZOLIN SODIUM-DEXTROSE 2-3 GM-% IV SOLR
INTRAVENOUS | Status: AC
  Filled 2011-08-11: qty 50

## 2011-08-11 MED ORDER — HYDROMORPHONE HCL PF 1 MG/ML IJ SOLN
0.5000 mg | INTRAMUSCULAR | Status: DC | PRN
  Administered 2011-08-12: 0.5 mg via INTRAVENOUS
  Filled 2011-08-11 (×3): qty 1

## 2011-08-11 MED ORDER — LACTATED RINGERS IV SOLN
INTRAVENOUS | Status: DC | PRN
  Administered 2011-08-11: 07:00:00 via INTRAVENOUS

## 2011-08-11 MED ORDER — VITAMIN D 1000 UNITS PO TABS
2000.0000 [IU] | ORAL_TABLET | Freq: Every day | ORAL | Status: DC
  Administered 2011-08-12 – 2011-08-13 (×2): 2000 [IU] via ORAL
  Filled 2011-08-11 (×3): qty 2

## 2011-08-11 MED ORDER — METOCLOPRAMIDE HCL 5 MG/ML IJ SOLN
5.0000 mg | Freq: Three times a day (TID) | INTRAMUSCULAR | Status: DC | PRN
Start: 2011-08-11 — End: 2011-08-13
  Administered 2011-08-11: 10 mg via INTRAVENOUS
  Filled 2011-08-11: qty 2

## 2011-08-11 MED ORDER — CEFAZOLIN SODIUM-DEXTROSE 2-3 GM-% IV SOLR
2.0000 g | Freq: Four times a day (QID) | INTRAVENOUS | Status: AC
  Administered 2011-08-11 – 2011-08-12 (×3): 2 g via INTRAVENOUS
  Filled 2011-08-11 (×3): qty 50

## 2011-08-11 MED ORDER — METOPROLOL TARTRATE 25 MG PO TABS
25.0000 mg | ORAL_TABLET | Freq: Every day | ORAL | Status: DC
  Administered 2011-08-11 – 2011-08-12 (×2): 25 mg via ORAL
  Filled 2011-08-11 (×3): qty 1

## 2011-08-11 MED ORDER — ACETAMINOPHEN 650 MG RE SUPP: 650.0000 mg | Freq: Four times a day (QID) | RECTAL | Status: DC | PRN

## 2011-08-11 MED ORDER — MIDAZOLAM HCL 2 MG/2ML IJ SOLN: 0.5000 mg | Freq: Once | INTRAMUSCULAR | Status: DC | PRN

## 2011-08-11 MED ORDER — ROCURONIUM BROMIDE 100 MG/10ML IV SOLN
INTRAVENOUS | Status: DC | PRN
  Administered 2011-08-11: 50 mg via INTRAVENOUS

## 2011-08-11 MED ORDER — MEPERIDINE HCL 25 MG/ML IJ SOLN: 6.2500 mg | INTRAMUSCULAR | Status: DC | PRN

## 2011-08-11 MED ORDER — CEFUROXIME SODIUM 1.5 G IJ SOLR
INTRAMUSCULAR | Status: DC | PRN
  Administered 2011-08-11: 1.5 g

## 2011-08-11 MED ORDER — DOCUSATE SODIUM 100 MG PO CAPS
100.0000 mg | ORAL_CAPSULE | Freq: Two times a day (BID) | ORAL | Status: DC
  Administered 2011-08-11 – 2011-08-13 (×4): 100 mg via ORAL
  Filled 2011-08-11 (×6): qty 1

## 2011-08-11 MED ORDER — MIDAZOLAM HCL 5 MG/5ML IJ SOLN
INTRAMUSCULAR | Status: DC | PRN
  Administered 2011-08-11: 1.5 mg via INTRAVENOUS

## 2011-08-11 MED ORDER — LACTATED RINGERS IV SOLN: INTRAVENOUS | Status: DC

## 2011-08-11 MED ORDER — PROMETHAZINE HCL 25 MG/ML IJ SOLN: 6.2500 mg | INTRAMUSCULAR | Status: DC | PRN

## 2011-08-11 MED ORDER — PHENOL 1.4 % MT LIQD: 1.0000 | OROMUCOSAL | Status: DC | PRN

## 2011-08-11 MED ORDER — ENOXAPARIN SODIUM 30 MG/0.3ML ~~LOC~~ SOLN
30.0000 mg | Freq: Two times a day (BID) | SUBCUTANEOUS | Status: DC
  Filled 2011-08-11: qty 0.3

## 2011-08-11 MED ORDER — ENOXAPARIN SODIUM 30 MG/0.3ML ~~LOC~~ SOLN
30.0000 mg | Freq: Two times a day (BID) | SUBCUTANEOUS | Status: DC
  Administered 2011-08-11 – 2011-08-13 (×4): 30 mg via SUBCUTANEOUS
  Filled 2011-08-11 (×6): qty 0.3

## 2011-08-11 MED ORDER — ACETAMINOPHEN 325 MG PO TABS: 650.0000 mg | ORAL_TABLET | Freq: Four times a day (QID) | ORAL | Status: DC | PRN

## 2011-08-11 MED ORDER — FENTANYL CITRATE 0.05 MG/ML IJ SOLN
INTRAMUSCULAR | Status: DC | PRN
  Administered 2011-08-11: 25 ug via INTRAVENOUS
  Administered 2011-08-11: 100 ug via INTRAVENOUS
  Administered 2011-08-11: 125 ug via INTRAVENOUS

## 2011-08-11 MED ORDER — GLYCOPYRROLATE 0.2 MG/ML IJ SOLN
INTRAMUSCULAR | Status: DC | PRN
  Administered 2011-08-11: 0.6 mg via INTRAVENOUS

## 2011-08-11 MED ORDER — SODIUM CHLORIDE 0.9 % IR SOLN
Status: DC | PRN
  Administered 2011-08-11: 3000 mL

## 2011-08-11 MED ORDER — ONDANSETRON HCL 4 MG/2ML IJ SOLN
4.0000 mg | Freq: Four times a day (QID) | INTRAMUSCULAR | Status: DC | PRN
  Administered 2011-08-11: 4 mg via INTRAVENOUS
  Filled 2011-08-11: qty 2

## 2011-08-11 MED ORDER — MENTHOL 3 MG MT LOZG: 1.0000 | LOZENGE | OROMUCOSAL | Status: DC | PRN

## 2011-08-11 MED ORDER — METOCLOPRAMIDE HCL 10 MG PO TABS: 5.0000 mg | ORAL_TABLET | Freq: Three times a day (TID) | ORAL | Status: DC | PRN

## 2011-08-11 SURGICAL SUPPLY — 68 items
BANDAGE ESMARK 6X9 LF (GAUZE/BANDAGES/DRESSINGS) ×1 IMPLANT
BLADE SAGITTAL 25.0X1.19X90 (BLADE) ×2 IMPLANT
BLADE SAW SGTL 11.0X1.19X90.0M (BLADE) IMPLANT
BLADE SAW SGTL 13.0X1.19X90.0M (BLADE) ×2 IMPLANT
BLADE SURG 10 STRL SS (BLADE) ×4 IMPLANT
BNDG CMPR 9X6 STRL LF SNTH (GAUZE/BANDAGES/DRESSINGS) ×1
BNDG CMPR MED 15X6 ELC VLCR LF (GAUZE/BANDAGES/DRESSINGS) ×1
BNDG ELASTIC 6X15 VLCR STRL LF (GAUZE/BANDAGES/DRESSINGS) ×2 IMPLANT
BNDG ESMARK 6X9 LF (GAUZE/BANDAGES/DRESSINGS) ×2
BOWL SMART MIX CTS (DISPOSABLE) ×2 IMPLANT
CEMENT HV SMART SET (Cement) ×4 IMPLANT
CLOTH BEACON ORANGE TIMEOUT ST (SAFETY) ×2 IMPLANT
COVER BACK TABLE 24X17X13 BIG (DRAPES) IMPLANT
COVER PROBE W GEL 5X96 (DRAPES) ×1 IMPLANT
COVER SURGICAL LIGHT HANDLE (MISCELLANEOUS) ×2 IMPLANT
CUFF TOURNIQUET SINGLE 34IN LL (TOURNIQUET CUFF) ×2 IMPLANT
CUFF TOURNIQUET SINGLE 44IN (TOURNIQUET CUFF) IMPLANT
DRAPE EXTREMITY T 121X128X90 (DRAPE) ×2 IMPLANT
DRAPE INCISE IOBAN 66X45 STRL (DRAPES) ×1 IMPLANT
DRAPE PROXIMA HALF (DRAPES) ×3 IMPLANT
DRAPE U-SHAPE 47X51 STRL (DRAPES) ×2 IMPLANT
DRSG ADAPTIC 3X8 NADH LF (GAUZE/BANDAGES/DRESSINGS) ×2 IMPLANT
DRSG PAD ABDOMINAL 8X10 ST (GAUZE/BANDAGES/DRESSINGS) ×4 IMPLANT
DURAPREP 26ML APPLICATOR (WOUND CARE) ×2 IMPLANT
ELECT CAUTERY BLADE 6.4 (BLADE) ×2 IMPLANT
ELECT REM PT RETURN 9FT ADLT (ELECTROSURGICAL) ×2
ELECTRODE REM PT RTRN 9FT ADLT (ELECTROSURGICAL) ×1 IMPLANT
EVACUATOR 1/8 PVC DRAIN (DRAIN) ×1 IMPLANT
FACESHIELD LNG OPTICON STERILE (SAFETY) ×2 IMPLANT
GLOVE BIO SURGEON STRL SZ7 (GLOVE) ×2 IMPLANT
GLOVE BIOGEL PI IND STRL 7.0 (GLOVE) ×1 IMPLANT
GLOVE BIOGEL PI IND STRL 7.5 (GLOVE) ×1 IMPLANT
GLOVE BIOGEL PI INDICATOR 7.0 (GLOVE) ×1
GLOVE BIOGEL PI INDICATOR 7.5 (GLOVE) ×1
GLOVE SS BIOGEL STRL SZ 7.5 (GLOVE) ×1 IMPLANT
GLOVE SUPERSENSE BIOGEL SZ 7.5 (GLOVE) ×2
GOWN PREVENTION PLUS XLARGE (GOWN DISPOSABLE) ×4 IMPLANT
GOWN STRL NON-REIN LRG LVL3 (GOWN DISPOSABLE) ×4 IMPLANT
HANDPIECE INTERPULSE COAX TIP (DISPOSABLE) ×2
HOOD PEEL AWAY FACE SHEILD DIS (HOOD) ×4 IMPLANT
IMMOBILIZER KNEE 22 UNIV (SOFTGOODS) IMPLANT
INSERT CUSHION PRONEVIEW LG (MISCELLANEOUS) ×2 IMPLANT
KIT BASIN OR (CUSTOM PROCEDURE TRAY) ×2 IMPLANT
KIT ROOM TURNOVER OR (KITS) ×2 IMPLANT
MANIFOLD NEPTUNE II (INSTRUMENTS) ×2 IMPLANT
NS IRRIG 1000ML POUR BTL (IV SOLUTION) ×2 IMPLANT
PACK TOTAL JOINT (CUSTOM PROCEDURE TRAY) ×2 IMPLANT
PAD ARMBOARD 7.5X6 YLW CONV (MISCELLANEOUS) ×4 IMPLANT
PAD CAST 4YDX4 CTTN HI CHSV (CAST SUPPLIES) ×1 IMPLANT
PADDING CAST COTTON 4X4 STRL (CAST SUPPLIES) ×2
PADDING CAST COTTON 6X4 STRL (CAST SUPPLIES) ×2 IMPLANT
POSITIONER HEAD PRONE TRACH (MISCELLANEOUS) ×2 IMPLANT
RUBBERBAND STERILE (MISCELLANEOUS) ×2 IMPLANT
SET HNDPC FAN SPRY TIP SCT (DISPOSABLE) ×1 IMPLANT
SPONGE GAUZE 4X4 12PLY (GAUZE/BANDAGES/DRESSINGS) ×2 IMPLANT
STRIP CLOSURE SKIN 1/2X4 (GAUZE/BANDAGES/DRESSINGS) ×2 IMPLANT
SUCTION FRAZIER TIP 10 FR DISP (SUCTIONS) ×2 IMPLANT
SUT MNCRL AB 3-0 PS2 18 (SUTURE) ×2 IMPLANT
SUT VIC AB 0 CT1 27 (SUTURE) ×6
SUT VIC AB 0 CT1 27XBRD ANBCTR (SUTURE) ×3 IMPLANT
SUT VIC AB 2-0 CT1 27 (SUTURE) ×4
SUT VIC AB 2-0 CT1 TAPERPNT 27 (SUTURE) ×2 IMPLANT
SUT VLOC 180 0 24IN GS25 (SUTURE) ×2 IMPLANT
SYR 30ML SLIP (SYRINGE) ×2 IMPLANT
TOWEL OR 17X24 6PK STRL BLUE (TOWEL DISPOSABLE) ×2 IMPLANT
TOWEL OR 17X26 10 PK STRL BLUE (TOWEL DISPOSABLE) ×2 IMPLANT
TRAY FOLEY CATH 14FR (SET/KITS/TRAYS/PACK) ×2 IMPLANT
WATER STERILE IRR 1000ML POUR (IV SOLUTION) ×6 IMPLANT

## 2011-08-11 NOTE — Op Note (Signed)
MRN:     [id number] DOB/AGE:    1941-03-08 / 71 y.o.       OPERATIVE REPORT    DATE OF PROCEDURE:  08/11/2011       PREOPERATIVE DIAGNOSIS:   DJD RIGHT KNEE      Estimated Body mass index is 34.58 kg/(m^2) as calculated from the following:   Height as of 03/27/11: 5\' 1" (1.549 m).   Weight as of 03/27/11: 183 lb(83.008 kg).                                                        POSTOPERATIVE DIAGNOSIS:   degenerative joint disease right knee                                                                      PROCEDURE:  Procedure(s): TOTAL KNEE ARTHROPLASTY Using Depuy Sigma RP implants #2.5 Femur, #2.5Tibia, 10mm sigma RP bearing, 32 Patella     SURGEON: Zacharius Funari A    ASSISTANT:  Kirstin Shepperson PA-C   (Present and scrubbed throughout the case, critical for assistance with exposure, retraction, instrumentation, and closure.)         ANESTHESIA: GET with Femoral Nerve Block  DRAINS: foley, 2 medium hemovac in knee   TOURNIQUET TIME:   COMPLICATIONS:  None     SPECIMENS: None   INDICATIONS FOR PROCEDURE: The patient has  DJD RIGHT KNEE, varus deformities, XR shows bone on bone arthritis. Patient has failed all conservative measures including anti-inflammatory medicines, narcotics, attempts at  exercise and weight loss, cortisone injections and viscosupplementation.  Risks and benefits of surgery have been discussed, questions answered.   DESCRIPTION OF PROCEDURE: The patient identified by armband, received  right femoral nerve block and IV antibiotics, in the holding area at [facility]. Patient taken to the operating room, appropriate anesthetic  monitors were attached General endotracheal anesthesia induced with  the patient in supine position, Foley catheter was inserted. Tourniquet  applied high to the operative thigh. Lateral post and foot positioner  applied to the table, the lower extremity was then prepped and draped  in usual sterile fashion from the  ankle to the tourniquet. Time-out procedure was performed. The limb was wrapped with an Esmarch bandage and the tourniquet inflated to 365 mmHg. We began the operation by making the anterior midline incision starting at handbreadth above the patella going over the patella 1 cm medial to and  4 cm distal to the tibial tubercle. Small bleeders in the skin and the  subcutaneous tissue identified and cauterized. Transverse retinaculum was incised and reflected medially and a medial parapatellar arthrotomy was accomplished. the patella was everted and theprepatellar fat pad resected. The superficial medial collateral  ligament was then elevated from anterior to posterior along the proximal  flare of the tibia and anterior half of the menisci resected. The knee was hyperflexed exposing bone on bone arthritis. Peripheral and notch osteophytes as well as the cruciate ligaments were then resected. We continued to  work our way around posteriorly along the proximal tibia, and externally  rotated  the tibia subluxing it out from underneath the femur. A McHale  retractor was placed through the notch and a lateral Hohmann retractor  placed, and we then drilled through the proximal tibia in line with the  axis of the tibia followed by an intramedullary guide rod and 2-degree  posterior slope cutting guide. The tibial cutting guide was pinned into place  allowing resection of 4 mm of bone medially and about 8 mm of bone  laterally because of her varus deformity. Satisfied with the tibial resection, we then  entered the distal femur 2 mm anterior to the PCL origin with the  intramedullary guide rod and applied the distal femoral cutting guide  set at 11mm, with 5 degrees of valgus. This was pinned along the  epicondylar axis. At this point, the distal femoral cut was accomplished without difficulty. We then sized for a #2.5 femoral component and pinned the guide in 3 degrees of external rotation.The chamfer cutting  guide was pinned into place. The anterior, posterior, and chamfer cuts were accomplished without difficulty followed by  the Sigma RP box cutting guide and the box cut. We also removed posterior osteophytes from the posterior femoral condyles. At this  time, the knee was brought into full extension. We checked our  extension and flexion gaps and found them symmetric at 10mm.  The patella thickness measured at 32 mm. We set the cutting guide at 15 and removed the posterior 8.5 mm  of the patella sized for 32 button and drilled the lollipop. The knee  was then once again hyperflexed exposing the proximal tibia. We sized for a #2.5 tibial base plate, applied the smokestack and the conical reamer followed by the the Delta fin keel punch. We then hammered into place the Sigma RP trial femoral component, inserted a 10-mm trial bearing, trial patellar button, and took the knee through range of motion from 0-130 degrees. No thumb pressure was required for patellar  tracking. At this point, all trial components were removed, a double batch of DePuy HV cement with 1500 mg of Zinacef was mixed and applied to all bony metallic mating surfaces except for the posterior condyles of the femur itself. In order, we  hammered into place the tibial tray and removed excess cement, the femoral component and removed excess cement, a 10-mm Sigma RP bearing  was inserted, and the knee brought to full extension with compression.  The patellar button was clamped into place, and excess cement  removed. While the cement cured the wound was irrigated out with normal saline solution pulse lavage, and medium Hemovac drains were placed.. Ligament stability and patellar tracking were checked and found to be excellent. The tourniquet was then released and hemostasis was obtained with cautery. The parapatellar arthrotomy was closed with Z lock suture. The subcutaneous tissue with 0 and 2-0 undyed  Vicryl suture, and 4-0 Monocryl.. A  dressing of Xeroform,  4 x 4, dressing sponges, Webril, and Ace wrap applied. Needle and sponge count were correct times 2.The patient awakened, extubated, and taken to recovery room without difficulty. Vascular status was normal, pulses 2+ and symmetric.   Brentin Shin A 08/11/2011, 9:04 AM

## 2011-08-11 NOTE — Anesthesia Preprocedure Evaluation (Signed)
Anesthesia Evaluation  Patient identified by MRN, date of birth, ID band Patient awake    Reviewed: Allergy & Precautions, H&P , NPO status , Patient's Chart, lab work & pertinent test results, reviewed documented beta blocker date and time   History of Anesthesia Complications Negative for: history of anesthetic complications  Airway Mallampati: II TM Distance: >3 FB Neck ROM: Full    Dental No notable dental hx. (+) Teeth Intact, Caps and Dental Advisory Given   Pulmonary  breath sounds clear to auscultation  Pulmonary exam normal       Cardiovascular hypertension, Pt. on medications and Pt. on home beta blockers Rhythm:Regular Rate:Normal     Neuro/Psych negative neurological ROS  negative psych ROS   GI/Hepatic Neg liver ROS, GERD-  Medicated and Controlled,  Endo/Other  Morbid obesity  Renal/GU negative Renal ROS     Musculoskeletal   Abdominal (+) + obese,   Peds  Hematology On coumadin for DVT prophylaxis   Anesthesia Other Findings   Reproductive/Obstetrics                           Anesthesia Physical Anesthesia Plan  ASA: III  Anesthesia Plan: General   Post-op Pain Management:    Induction: Intravenous  Airway Management Planned: Oral ETT  Additional Equipment:   Intra-op Plan:   Post-operative Plan: Extubation in OR  Informed Consent: I have reviewed the patients History and Physical, chart, labs and discussed the procedure including the risks, benefits and alternatives for the proposed anesthesia with the patient or authorized representative who has indicated his/her understanding and acceptance.   Dental advisory given  Plan Discussed with: CRNA and Surgeon  Anesthesia Plan Comments: (Plan routine monitors, GETA with femoral nerve block for post op analgesia)        Anesthesia Quick Evaluation

## 2011-08-11 NOTE — Interval H&P Note (Signed)
History and Physical Interval Note:  08/11/2011 7:05 AM  Bonnie Zhang  has presented today for surgery, with the diagnosis of DJD RIGHT KNEE  The various methods of treatment have been discussed with the patient and family. After consideration of risks, benefits and other options for treatment, the patient has consented to  Procedure(s) (LRB): TOTAL KNEE ARTHROPLASTY (Right) as a surgical intervention .  The patients' history has been reviewed, patient examined, no change in status, stable for surgery.  I have reviewed the patients' chart and labs.  Questions were answered to the patient's satisfaction.     Salvatore Marvel A

## 2011-08-11 NOTE — Anesthesia Postprocedure Evaluation (Signed)
  Anesthesia Post-op Note  Patient: Bonnie Zhang  Procedure(s) Performed: Procedure(s) (LRB): TOTAL KNEE ARTHROPLASTY (Right)  Patient Location: PACU  Anesthesia Type: GA combined with regional for post-op pain  Level of Consciousness: awake, alert  and oriented  Airway and Oxygen Therapy: Patient Spontanous Breathing and Patient connected to nasal cannula oxygen  Post-op Pain: mild  Post-op Assessment: Post-op Vital signs reviewed, Patient's Cardiovascular Status Stable, Respiratory Function Stable, Patent Airway, No signs of Nausea or vomiting and Pain level controlled  Post-op Vital Signs: Reviewed and stable  Complications: No apparent anesthesia complications

## 2011-08-11 NOTE — Progress Notes (Signed)
Orthopedic Tech Progress Note Patient Details:  Bonnie Zhang September 29, 1940 [id number]  CPM Right Knee CPM Right Knee: On Right Knee Flexion (Degrees): 0  Right Knee Extension (Degrees): 90  Additional Comments: trapeze bar   Cammer, Mickie Bail 08/11/2011, 11:02 AM

## 2011-08-11 NOTE — Plan of Care (Signed)
Problem: Consults Goal: Diagnosis- Total Joint Replacement Outcome: Completed/Met Date Met:  08/11/11 Primary Total Knee  Problem: Phase I Progression Outcomes Goal: Initial discharge plan identified Outcome: Progressing Pt states plan for discharge to [facility] for rehab.

## 2011-08-11 NOTE — Transfer of Care (Signed)
Immediate Anesthesia Transfer of Care Note  Patient: Bonnie Zhang  Procedure(s) Performed: Procedure(s) (LRB): TOTAL KNEE ARTHROPLASTY (Right)  Patient Location: PACU  Anesthesia Type: General and Regional  Level of Consciousness: awake, alert , oriented and sedated  Airway & Oxygen Therapy: Patient Spontanous Breathing and Patient connected to nasal cannula oxygen  Post-op Assessment: Report given to PACU RN, Post -op Vital signs reviewed and stable and Patient moving all extremities  Post vital signs: Reviewed and stable  Complications: No apparent anesthesia complications

## 2011-08-11 NOTE — Anesthesia Procedure Notes (Addendum)
Performed by: Donette Larry E    Procedure Name: Intubation Date/Time: 08/11/2011 7:38 AM Performed by: Fransisca Kaufmann Pre-anesthesia Checklist: Patient identified, Timeout performed, Emergency Drugs available, Suction available and Patient being monitored Patient Re-evaluated:Patient Re-evaluated prior to inductionOxygen Delivery Method: Circle system utilized Preoxygenation: Pre-oxygenation with 100% oxygen Intubation Type: IV induction Ventilation: Mask ventilation without difficulty Laryngoscope Size: Miller and 3 Grade View: Grade I Tube type: Oral Number of attempts: 1 Airway Equipment and Method: Patient positioned with wedge pillow and Stylet Placement Confirmation: ETT inserted through vocal cords under direct vision,  positive ETCO2,  CO2 detector and breath sounds checked- equal and bilateral Secured at: 22 cm Tube secured with: Tape Dental Injury: Teeth and Oropharynx as per pre-operative assessment     Anesthesia Regional Block:  Femoral nerve block  Pre-Anesthetic Checklist: ,, timeout performed, Correct Patient, Correct Site, Correct Laterality, Correct Procedure, Correct Position, site marked, Risks and benefits discussed,  Surgical consent,  Pre-op evaluation,  At surgeon's request and post-op pain management  Laterality: Right  Prep: chloraprep       Needles:  Injection technique: Single-shot  Needle Type: Stimulator Needle - 40     Needle Length: 4cm  Needle Gauge: 22 and 22 G    Additional Needles:  Procedures: nerve stimulator Femoral nerve block  Nerve Stimulator or Paresthesia:  Response: patella twitch, 0.45 mA, 0.1 ms,   Additional Responses:   Narrative:  Start time: 08/11/2011 7:08 AM End time: 08/11/2011 7:14 AM Injection made incrementally with aspirations every 5 mL.  Performed by: Personally  Anesthesiologist: Sandford Craze, MD  Additional Notes: Pt identified in Holding room.  Monitors applied. Working IV access confirmed. Sterile  prep R groin.  #22ga PNS to patella twitch at 0.79mA threshold.  30cc 0.5% Bupivacaine with 1:200k epi injected incrementally after negative test dose.  Patient asymptomatic, VSS, no heme aspirated, tolerated well.   Sandford Craze, MD  Femoral nerve block

## 2011-08-11 NOTE — Progress Notes (Signed)
UR COMPLETED  

## 2011-08-11 NOTE — Progress Notes (Signed)
ANTICOAGULATION CONSULT NOTE - Initial Consult  Pharmacy Consult for Coumadin Indication: VTE prophylaxis, hx DVT  Allergies  Allergen Reactions  . Aspirin     REACTION: upset stomach  . Atorvastatin     REACTION: leg pain  . Hctz (Hydrochlorothiazide) Other (See Comments)    FATIGUE AND EXTREMELY LOW POTASSIUM    Patient Measurements: Height: 5' (152.4 cm) Weight: 177 lb 11.1 oz (80.6 kg) IBW/kg (Calculated) : 45.5  Heparin Dosing Weight:   Vital Signs: Temp: 97.9 F (36.6 C) (05/13 1039) Temp src: Oral (05/13 0609) BP: 137/68 mmHg (05/13 1012) Pulse Rate: 58  (05/13 1045)  Labs:  Basename 08/11/11 0628  HGB --  HCT --  PLT --  APTT 26  LABPROT 13.4  INR 1.00  HEPARINUNFRC --  CREATININE --  CKTOTAL --  CKMB --  TROPONINI --    Estimated Creatinine Clearance: 62.3 ml/min (by C-G formula based on Cr of 0.67).   Medical History: Past Medical History  Diagnosis Date  . High cholesterol     takes Simvastating daily  . Vitamin d deficiency     takes VIt D daily  . DVT (deep vein thrombosis) in pregnancy      1965 post C section;2001 with prolonged driving while on  HRT  . PVD (peripheral vascular disease)     abnormal venous doppler findings due to recurrent DVT'S  . Osteoporosis   . Right knee DJD   . Hypertension     takes Benazepril,Amlodipine,and Metoprolol daily  . Hx of migraines     yrs ago   . Joint pain   . Joint swelling   . Bruises easily     pt is on Coumadin;last dose of Coumadin 08/05/11 and then Lovenox started 08/07/11  . GERD (gastroesophageal reflux disease)     occ will use TUMS  . Hx of colonic polyps     Medications:  Prescriptions prior to admission  Medication Sig Dispense Refill  . amLODipine (NORVASC) 5 MG tablet TAKE ONE TABLET BY MOUTH EVERY DAY  90 tablet  2  . benazepril (LOTENSIN) 10 MG tablet TAKE ONE TABLET BY MOUTH EVERY DAY  90 tablet  2  . Cholecalciferol (VITAMIN D) 2000 UNITS CAPS Take 2,000 Units by mouth  daily.        Marland Kitchen enoxaparin (LOVENOX) 120 MG/0.8ML injection Inject 0.8 mLs (120 mg total) into the skin daily.  4 Syringe  0  . metoprolol (LOPRESSOR) 50 MG tablet Take 25 mg by mouth daily. 1/2 by mouth once daily      . simvastatin (ZOCOR) 20 MG tablet TAKE ONE TABLET BY MOUTH AT BEDTIME  90 tablet  3  . warfarin (COUMADIN) 5 MG tablet Take 2.5-5 mg by mouth daily. Take 1/2 tab (2.5 mg) on Monday, Wednesday, Friday, and Saturday Take 1 tab (5 mg) on Tuesday, Thursday, and Sunday      . DISCONTD: simvastatin (ZOCOR) 20 MG tablet Take 10 mg by mouth at bedtime. Pt takes 1/2 tablet daily (10mg )       . warfarin (COUMADIN) 5 MG tablet         Assessment: 70yof to restart Coumadin for VTE prophylaxis s/p TKA with h/o DVT. Per Med Rec, patient's home regimen is 2.5mg  daily except 5mg  on Tue/Thur/Sun. Patient will also be receiving Lovenox 30mg  q24h until INR > 1.8 per MD orders. - Baseline INR: 1 - No significant bleeding reported  Goal of Therapy:  INR 2-3   Plan:  1.  Coumadin 5mg  po x 1 today 2. Daily PT/INR  Cleon Dew [phone] 08/11/2011,11:39 AM

## 2011-08-11 NOTE — Preoperative (Addendum)
Beta Blockers   Reason not to administer Beta Blockers:Not Applicable 

## 2011-08-11 NOTE — Progress Notes (Signed)
CARE MANAGEMENT NOTE 08/11/2011  Patient:  Bonnie Zhang, Bonnie Zhang   Account Number:  [id number]  Date Initiated:  08/11/2011  Documentation initiated by:  Vance Peper  Subjective/Objective Assessment:   71 yr old female s/p right total knee arthroplasty     Action/Plan:   patient fresh postop- CM will follow   Status of service:  In process, will continue to follow

## 2011-08-11 NOTE — H&P (View-Only) (Signed)
Bonnie Zhang is an 71 y.o. female.   Chief Complaint: right knee DJD HPI: Bonnie Zhang is a 71 year old seen for follow-up from her bilateral knee degenerative joint disease right worse than left getting progressively worse. She had a trial of Supartz injections the last done on 02/04/11 but this helped minimally. She has pain on a daily basis, pain with weightbearing and activity relieved by rest. She sees Dr. Alwyn Ren for her medical care. 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  The excruciating pain is getting progressively worse, limiting activities of daily living, poses a significant fall risk, and has failed multiple conservative treatments including intraarticular cortisone injections, intraarticular Supartz, bracing, medication and home physical therapy.  Past Medical History  Diagnosis Date  . Hypertension   . High cholesterol   . Vitamin d deficiency   . DVT (deep vein thrombosis) in pregnancy      1966 post C section;2001 with prolonged driving while on  HRT  . GERD (gastroesophageal reflux disease)   . PVD (peripheral vascular disease)     abnormal venous doppler findings due to recurrent DVT'S  . Osteoporosis   . Right knee DJD     Past Surgical History  Procedure Date  . Cesarean section     X3  . Breast lumpectomy   . Cholecystectomy, laparoscopic   . Colonoscopy w/ polypectomy 2007    Dr  Jarold Motto; due? 2014  . Dilation and curettage of uterus 2012    uterine polyp, Dr Shawnie Pons    Family History  Problem Relation Age of Onset  . Kidney disease Mother   . Hypertension Mother   . Leukemia Father   . Cancer Maternal Grandmother     COLON  . Diabetes Other     cousins  . Deep vein thrombosis Mother     post ankle fracture   Social History:  reports that she has never smoked. She has never used smokeless tobacco. She reports that she does not drink alcohol or use illicit drugs.  Allergies:  Allergies  Allergen Reactions    . Aspirin     REACTION: upset stomach  . Atorvastatin     REACTION: leg pain  . Hctz (Hydrochlorothiazide) Other (See Comments)    FATIGUE AND EXTREMELY LOW POTASSIUM     (Not in a hospital admission)  No results found for this or any previous visit (from the past 48 hour(s)). No results found.  Review of Systems  Constitutional: Negative.   HENT: Negative.   Eyes: Negative.   Respiratory: Negative.   Cardiovascular: Negative.   Gastrointestinal: Negative.   Genitourinary: Negative.   Musculoskeletal: Positive for joint pain.       Bilateral knee pain right worse than left  Skin: Negative.   Neurological: Negative.   Endo/Heme/Allergies: Bruises/bleeds easily.  Psychiatric/Behavioral: Negative.     Blood pressure 141/85, pulse 82, temperature 98.1 F (36.7 C), SpO2 96.00%. Physical Exam  Constitutional: She is oriented to person, place, and time. She appears well-developed and well-nourished.  HENT:  Head: Normocephalic and atraumatic.  Mouth/Throat: Oropharynx is clear and moist.  Eyes: EOM are normal. Pupils are equal, round, and reactive to light.  Neck: Normal range of motion. Neck supple.  Cardiovascular: Normal rate and regular rhythm.   Respiratory: Effort normal and breath sounds normal.  GI: Soft. Bowel sounds are normal.  Genitourinary:       Not pertinent to current symptomatology therefore  not examined.  Musculoskeletal:        Examination of both knees reveals moderate varus deformity bilaterally, 1+ crepitation 1+ synovitis range of motion 0-120 degrees knees are stable with normal patella tracking. Vascular exam: pulses 2+ and symmetric. Moderately antalgic gait no assistive devices  Neurological: She is alert and oriented to person, place, and time.  Skin: Skin is warm and dry.  Psychiatric: She has a normal mood and affect. Her behavior is normal. Judgment and thought content normal.     XRAY:  AP,LATERAL, FLEXION, and SUNRISE views show  significant joint space narrowing, with periarticular osteophytes, and subchondral sclerosis.  Assessment Patient Active Problem List  Diagnoses  . VITAMIN D DEFICIENCY  . HYPERLIPIDEMIA  . Dysmetabolic syndrome X  . HYPERTENSION, ESSENTIAL NOS  . GERD  . DVT, HX OF  . COLONIC POLYPS, HX OF  . Long term current use of anticoagulant  . OSTEOPOROSIS  . Postmenopausal bleeding  . Unspecified adverse effect of unspecified drug, medicinal and biological substance  . Hypertension  . High cholesterol  . Vitamin d deficiency  . DVT (deep vein thrombosis) in pregnancy  . GERD (gastroesophageal reflux disease)  . PVD (peripheral vascular disease)  . Right knee DJD    Plan I talk to her about this in detail. I recommend with her significant pain and lack of response to conservative care that we proceed with right total knee replacement. Discussed risks benefits and possible complications of the surgery in detail and she understands this completely.   Bonnie Zhang J 07/29/2011, 3:59 PM

## 2011-08-12 ENCOUNTER — Encounter (HOSPITAL_COMMUNITY): Payer: Self-pay | Admitting: Orthopedic Surgery

## 2011-08-12 LAB — BASIC METABOLIC PANEL
BUN: 9 mg/dL (ref 6–23)
CO2: 25 mEq/L (ref 19–32)
Calcium: 8.3 mg/dL — ABNORMAL LOW (ref 8.4–10.5)
Chloride: 103 mEq/L (ref 96–112)
Creatinine, Ser: 0.72 mg/dL (ref 0.50–1.10)
GFR calc Af Amer: >90 mL/min (ref >90)
GFR calc non Af Amer: 86 mL/min — ABNORMAL LOW (ref >90)
Glucose, Bld: 137 mg/dL — ABNORMAL HIGH (ref 70–99)
Potassium: 3.7 mEq/L (ref 3.5–5.1)
Sodium: 137 mEq/L (ref 135–145)

## 2011-08-12 LAB — PROTIME-INR
INR: 1.18 (ref 0.00–1.49)
Prothrombin Time: 15.3 seconds — ABNORMAL HIGH (ref 11.6–15.2)

## 2011-08-12 LAB — CBC
HCT: 33.8 % — ABNORMAL LOW (ref 36.0–46.0)
Hemoglobin: 11.4 g/dL — ABNORMAL LOW (ref 12.0–15.0)
MCH: 29.5 pg (ref 26.0–34.0)
MCHC: 33.7 g/dL (ref 30.0–36.0)
MCV: 87.3 fL (ref 78.0–100.0)
Platelets: 152 10*3/uL (ref 150–400)
RBC: 3.87 MIL/uL (ref 3.87–5.11)
RDW: 13.3 % (ref 11.5–15.5)
WBC: 9.1 10*3/uL (ref 4.0–10.5)

## 2011-08-12 MED ORDER — WARFARIN SODIUM 5 MG PO TABS
5.0000 mg | ORAL_TABLET | Freq: Once | ORAL | Status: AC
  Administered 2011-08-12: 5 mg via ORAL
  Filled 2011-08-12: qty 1

## 2011-08-12 MED ORDER — PANTOPRAZOLE SODIUM 40 MG PO TBEC
40.0000 mg | DELAYED_RELEASE_TABLET | Freq: Every day | ORAL | Status: DC
  Administered 2011-08-12 – 2011-08-13 (×2): 40 mg via ORAL
  Filled 2011-08-12 (×2): qty 1

## 2011-08-12 MED ORDER — SODIUM CHLORIDE 0.9 % IV BOLUS (SEPSIS)
500.0000 mL | Freq: Once | INTRAVENOUS | Status: AC
  Administered 2011-08-12: 500 mL via INTRAVENOUS

## 2011-08-12 MED ORDER — SIMVASTATIN 20 MG PO TABS
20.0000 mg | ORAL_TABLET | Freq: Every day | ORAL | Status: DC
  Administered 2011-08-12: 20 mg via ORAL
  Filled 2011-08-12 (×2): qty 1

## 2011-08-12 MED ORDER — ALUM & MAG HYDROXIDE-SIMETH 200-200-20 MG/5ML PO SUSP: 30.0000 mL | ORAL | Status: DC | PRN

## 2011-08-12 NOTE — Progress Notes (Signed)
PT Progress Note  Past Medical History  Diagnosis Date  . High cholesterol     takes Simvastating daily  . Vitamin d deficiency     takes VIt D daily  . DVT (deep vein thrombosis) in pregnancy      1965 post C section;2001 with prolonged driving while on  HRT  . PVD (peripheral vascular disease)     abnormal venous doppler findings due to recurrent DVT'S  . Osteoporosis   . Right knee DJD   . Hypertension     takes Benazepril,Amlodipine,and Metoprolol daily  . Hx of migraines     yrs ago   . Joint pain   . Joint swelling   . Bruises easily     pt is on Coumadin;last dose of Coumadin 08/05/11 and then Lovenox started 08/07/11  . GERD (gastroesophageal reflux disease)     occ will use TUMS  . Hx of colonic polyps    Past Surgical History  Procedure Date  . Cesarean section 1961/1965/1966    X 3  . Colonoscopy w/ polypectomy 2007    Dr  Jarold Motto; due? 2014  . Breast lumpectomy 1987  . Cholecystectomy 1997  . Dilation and curettage of uterus 2012    uterine polyp, Dr Shawnie Pons  . Appendectomy 1965  . Esophagogastroduodenoscopy   . Total knee arthroplasty 08/11/2011    Procedure: TOTAL KNEE ARTHROPLASTY;  Surgeon: Nilda Simmer, MD;  Location: [facility] OR;  Service: Orthopedics;  Laterality: Right;  DR Anne Ng 90 MINUTES FOR THIS CASE    08/12/11 1500  PT Visit Information  Last PT Received On 08/12/11  Assistance Needed +1  PT Time Calculation  PT Start Time 1430  PT Stop Time 1457  PT Time Calculation (min) 27 min  Precautions  Precautions Knee  Required Braces or Orthoses Knee Immobilizer - Right  Knee Immobilizer - Right On when out of bed or walking  Restrictions  Weight Bearing Restrictions Yes  RLE Weight Bearing WBAT  Cognition  Overall Cognitive Status Appears within functional limits for tasks assessed/performed  Arousal/Alertness Awake/alert  Orientation Level Appears intact for tasks assessed  Behavior During Session [facility] for tasks performed  Bed Mobility    Bed Mobility Supine to Sit  Supine to Sit 4: Min assist;With rails;HOB elevated  Details for Bed Mobility Assistance initial assistance with R LE  Transfers  Transfers Sit to Stand;Stand to Sit  Sit to Stand With upper extremity assist;From chair/3-in-1;4: Min assist  Stand to Sit 4: Min guard;To bed;With upper extremity assist  Details for Transfer Assistance (A) to initiate transfer with max cues for hand placement  Ambulation/Gait  Ambulation/Gait Assistance 4: Min guard  Ambulation Distance (Feet) 30 Feet  Assistive device Rolling walker  Ambulation/Gait Assistance Details VCs for step sequence and RW placement  Gait Pattern Step-to pattern;Decreased step length - right;Decreased stance time - right;Antalgic  Gait velocity slow  Stairs No  Exercises  Exercises Total Joint  Total Joint Exercises  Ankle Circles/Pumps AROM;Both;5 reps;Supine  Quad Sets AROM;Right;5 reps;Supine  Heel Slides AAROM;10 reps;Right;Supine  Short Arc Quad AAROM;Right;10 reps;Supine  Hip ABduction/ADduction Strengthening;AAROM;Right;10 reps;Supine  Straight Leg Raises AAROM;Strengthening;Right;10 reps;Supine  PT - End of Session  Equipment Utilized During Treatment Gait belt;Right knee immobilizer  Activity Tolerance Patient tolerated treatment well  Patient left in bed;with call bell/phone within reach (In bed getting ready to get bath.  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  Pt making great progress and able to ambulate with minguard (A).  PT Plan Discharge plan remains appropriate;Frequency remains appropriate  PT Frequency 7X/week  Follow Up Recommendations Skilled nursing facility  Equipment Recommended Defer to next venue  Acute Rehab PT Goals  PT Goal Formulation With patient  Time For Goal Achievement 08/19/11  Potential to Achieve Goals Good  Pt will go Supine/Side to Sit with modified  independence;with HOB 0 degrees  PT Goal: Supine/Side to Sit - Progress Progressing toward goal  Pt will go Sit to Supine/Side with modified independence;with HOB 0 degrees  PT Goal: Sit to Supine/Side - Progress Progressing toward goal  Pt will go Sit to Stand with modified independence;with upper extremity assist  PT Goal: Sit to Stand - Progress Progressing toward goal  Pt will Ambulate 51 - 150 feet;with modified independence;with rolling walker  PT Goal: Ambulate - Progress Progressing toward goal  Pt will Perform Home Exercise Program Independently  PT Goal: Perform Home Exercise Program - Progress Progressing toward goal     Martell, PT DPT [phone]

## 2011-08-12 NOTE — Progress Notes (Signed)
Clinical Social Work Department BRIEF PSYCHOSOCIAL ASSESSMENT 08/12/2011  Patient:  Bonnie Zhang, Bonnie Zhang     Account Number:  [id number]     Admit date:  08/11/2011  Clinical Social Worker:  Skip Mayer  Date/Time:  08/12/2011 03:48 PM  Referred by:  Physician  Date Referred:  08/11/2011 Referred for  SNF Placement   Other Referral:   Interview type:  Patient Other interview type:    PSYCHOSOCIAL DATA Living Status:  ALONE Admitted from facility:   Level of care:   Primary support name:  Amy Primary support relationship to patient:  CHILD, ADULT Degree of support available:   Adequate, per pt    CURRENT CONCERNS Current Concerns  Post-Acute Placement   Other Concerns:    SOCIAL WORK ASSESSMENT / PLAN CSW met with pt re: PT/OT recommendation for SNF. Pt reports pre-reg with [facility]. CSW confirmed pre-reg with admissions coordintor at [place], Lanora Manis. CSW completed FL2 and placed on chart for MD signature.   Assessment/plan status:  Information/Referral to [facility] Other assessment/ plan:   Information/referral to community resources:   SNF    PATIENT'S/FAMILY'S RESPONSE TO PLAN OF CARE: Pt reports agreeable to ST SNF in order to increase strength and independence with mobility prior to return home alone. Pt verbalized understanding of [employer] copays.        Dellie Burns, MSW, LCSWA [phone] (coverage)

## 2011-08-12 NOTE — Progress Notes (Signed)
ANTICOAGULATION CONSULT NOTE - Follow Up Consult  Pharmacy Consult for Coumadin Indication:  VTE prophylaxis s/p right TKA; Chronic Coumadin for hx DVT  Allergies  Allergen Reactions  . Aspirin     REACTION: upset stomach  . Atorvastatin     REACTION: leg pain  . Hctz (Hydrochlorothiazide) Other (See Comments)    FATIGUE AND EXTREMELY LOW POTASSIUM    Patient Measurements: Height: 5' (152.4 cm) Weight: 177 lb 11.1 oz (80.6 kg) IBW/kg (Calculated) : 45.5   Vital Signs: Temp: 98.3 F (36.8 C) (05/14 0558) Temp src: Oral (05/14 0558) BP: 112/57 mmHg (05/14 0558) Pulse Rate: 81  (05/14 0558)  Labs:  Basename 08/12/11 0715 08/11/11 0628  HGB 11.4* --  HCT 33.8* --  PLT 152 --  APTT -- 26  LABPROT 15.3* 13.4  INR 1.18 1.00  HEPARINUNFRC -- --  CREATININE 0.72 --  CKTOTAL -- --  CKMB -- --  TROPONINI -- --    Estimated Creatinine Clearance: 62.3 ml/min (by C-G formula based on Cr of 0.72).  Assessment:    Home Coumadin regimen: 2.5 mg MWF-Saturday; 5 mg Tues-Thurs-Sunday.  Coumadin stopped after 08/05/11 dose. Coumadin resumed with 5 mg PO 08/11/11 pm.    Also on Lovenox 30 mg SQ q12hrs until INR > or = 1.8. Had been on full-dose Lovenox (120 mg sq q24h) 5/8-5/12. Pre-op Platelet count 201; down to 152K today.  Goal of Therapy:  INR 2-3 for hx DVT, on chronic Coumadin Monitor platelets by anticoagulation protocol: Yes   Plan:    Repeat Coumadin 5 mg PO tonight.   Continue Lovenox 30 mg SQ q12hrs.   Daily PT/INR.     Will watch platelet count with you.  Dennie Fetters, [place] Pager: [phone] 08/12/2011,2:42 PM

## 2011-08-12 NOTE — Progress Notes (Signed)
Patient ID: Bonnie Zhang, female   DOB: May 20, 1940, 71 y.o.   MRN: [id number] PATIENT ID: Bonnie Zhang  MRN: [id number]  DOB/AGE:  05-21-1940 / 71 y.o.  1 Day Post-Op Procedure(s) (LRB): TOTAL KNEE ARTHROPLASTY (Right)    PROGRESS NOTE Subjective: Patient is alert, oriented, no Nausea, no Vomiting, yes} passing gas, no Bowel Movement. Taking PO clear liquids. Denies SOB, Chest or Calf Pain. Using Incentive Spirometer, PAS in place. Ambulate not yet, CPM 0-90 Patient reports pain as 6 on 0-10 scale  .    Objective: Vital signs in last 24 hours: Filed Vitals:   08/11/11 1100 08/11/11 1409 08/11/11 1704 08/12/11 0558  BP:  125/47 129/53 112/57  Pulse:  74 73 81  Temp:  97.7 F (36.5 C) 98.1 F (36.7 C) 98.3 F (36.8 C)  TempSrc:  Oral Oral Oral  Resp:  16 18 18   Height: 5' (1.524 m)     Weight: 80.6 kg (177 lb 11.1 oz)     SpO2:  96% 97% 96%      Intake/Output from previous day: I/O last 3 completed shifts: In: 1160 [P.O.:240; I.V.:800; Other:120] Out: 1400 [Urine:1150; Drains:200; Blood:50]   Intake/Output this shift:     LABORATORY DATA:  Basename 08/12/11 0715 08/11/11 0628  WBC 9.1 --  HGB 11.4* --  HCT 33.8* --  PLT 152 --  NA 137 --  K 3.7 --  CL 103 --  CO2 25 --  BUN 9 --  CREATININE 0.72 --  GLUCOSE 137* --  GLUCAP -- --  INR 1.18 1.00  CALCIUM 8.3* --    Examination: Neurologically intact ABD soft Neurovascular intact Sensation intact distally Intact pulses distally Dorsiflexion/Plantar flexion intact Incision: dressing C/D/I}  Assessment:   1 Day Post-Op Procedure(s) (LRB): TOTAL KNEE ARTHROPLASTY (Right) ADDITIONAL DIAGNOSIS:  Acute Blood Loss Anemia, Hypertension and hx of DVT  Plan: PT/OT WBAT, CPM 5/hrs day until ROM 0-90 degrees, then D/C CPM DVT Prophylaxis:  SCDx72hr\Coumadin for 2 weeks target INR 1.5-2.0 DISCHARGE PLAN: Skilled Nursing Facility/Rehab DISCHARGE NEEDS: patient requests [facility] [place]       Rebel Willcutt J 08/12/2011, 8:14 AM

## 2011-08-12 NOTE — Progress Notes (Signed)
Clinical Social Work Department CLINICAL SOCIAL WORK PLACEMENT NOTE 08/12/2011  Patient:  Bonnie Zhang, Bonnie Zhang  Account Number:  [id number] Admit date:  08/11/2011  Clinical Social Worker:  Skip Mayer  Date/time:  08/12/2011 03:30 PM  Clinical Social Work is seeking post-discharge placement for this patient at the following level of care:   SKILLED NURSING   (*CSW will update this form in Epic as items are completed)   08/12/2011  Patient/family provided with [facility] Department of Clinical Social Work's list of facilities offering this level of care within the geographic area requested by the patient (or if unable, by the patient's family).  08/12/2011  Patient/family informed of their freedom to choose among providers that offer the needed level of care, that participate in Medicare, Medicaid or managed care program needed by the patient, have an available bed and are willing to accept the patient.  08/12/2011  Patient/family informed of [facility]' ownership interest in [facility], as well as of the fact that they are under no obligation to receive care at this facility.  PASARR submitted to EDS on 08/12/2011 PASARR number received from EDS on   FL2 transmitted to all facilities in geographic area requested by pt/family on  08/12/2011 FL2 transmitted to all facilities within larger geographic area on   Patient informed that his/her managed care company has contracts with or will negotiate with  certain facilities, including the following:     Patient/family informed of bed offers received:   Patient chooses bed at  Physician recommends and patient chooses bed at    Patient to be transferred to  on   Patient to be transferred to facility by   The following physician request were entered in Epic:   Additional Comments: FL2 sent to [facility] only as pt registered with that SNF prior to admission.  Dellie Burns, MSW, LCSWA [phone] (coverage)

## 2011-08-12 NOTE — Progress Notes (Signed)
Physical Therapy Evaluation Note  Past Medical History  Diagnosis Date  . High cholesterol     takes Simvastating daily  . Vitamin d deficiency     takes VIt D daily  . DVT (deep vein thrombosis) in pregnancy      1965 post C section;2001 with prolonged driving while on  HRT  . PVD (peripheral vascular disease)     abnormal venous doppler findings due to recurrent DVT'S  . Osteoporosis   . Right knee DJD   . Hypertension     takes Benazepril,Amlodipine,and Metoprolol daily  . Hx of migraines     yrs ago   . Joint pain   . Joint swelling   . Bruises easily     pt is on Coumadin;last dose of Coumadin 08/05/11 and then Lovenox started 08/07/11  . GERD (gastroesophageal reflux disease)     occ will use TUMS  . Hx of colonic polyps     Past Surgical History  Procedure Date  . Cesarean section 1961/1965/1966    X 3  . Colonoscopy w/ polypectomy 2007    Dr  Jarold Motto; due? 2014  . Breast lumpectomy 1987  . Cholecystectomy 1997  . Dilation and curettage of uterus 2012    uterine polyp, Dr Shawnie Pons  . Appendectomy 1965  . Esophagogastroduodenoscopy     08/12/11 0843  PT Visit Information  Last PT Received On 08/12/11  Assistance Needed +1  PT Time Calculation  PT Start Time 0843  PT Stop Time 0907  PT Time Calculation (min) 24 min  Subjective Data  Subjective Pt received supine in bed with report of 3-4/10 R knee pain.   Patient Stated Goal [facility] going to [facility].  Precautions  Precautions Knee  Required Braces or Orthoses Knee Immobilizer - Right  Knee Immobilizer - Right On when out of bed or walking  Restrictions  Weight Bearing Restrictions Yes  RLE Weight Bearing WBAT  Home Living  Lives With Alone  Available Help at Discharge (none)  Type of Home House  Home Access Stairs to enter  Entrance Stairs-Number of Steps 1  Entrance Stairs-Rails None  Home Layout One level  Bathroom Shower/Tub Tub only  Information systems manager  Additional Comments pt going  to rehab center  Prior Function  Level of Independence Independent  Able to Take Stairs? Yes  Driving Yes  Vocation Retired  Geneticist, molecular No difficulties  Cognition  Overall Cognitive Status Appears within functional limits for tasks assessed/performed  Arousal/Alertness Awake/alert  Orientation Level Oriented X4 / Intact  Behavior During Session [facility] for tasks performed  Right Upper Extremity Assessment  RUE ROM/Strength/Tone WFL  Left Upper Extremity Assessment  LUE ROM/Strength/Tone WFL  Right Lower Extremity Assessment  RLE ROM/Strength/Tone Deficits;Due to pain (able to initiate quad set, AA R knee flex 65)  Left Lower Extremity Assessment  LLE ROM/Strength/Tone WFL for tasks assessed  Trunk Assessment  Trunk Assessment Normal  Bed Mobility  Bed Mobility Supine to Sit  Supine to Sit 4: Min assist;With rails;HOB elevated (HOB 25 deg)  Details for Bed Mobility Assistance initial assistance with R LE  Transfers  Transfers Sit to Stand;Stand to Sit  Sit to Stand 4: Min assist;With upper extremity assist;From bed  Stand to Sit 4: Min assist;With upper extremity assist;To chair/3-in-1  Details for Transfer Assistance v/c's for hand placement and sequencing  Ambulation/Gait  Ambulation/Gait Assistance 4: Min assist  Ambulation Distance (Feet) 20 Feet  Assistive device Rolling walker  Ambulation/Gait  Assistance Details initial v/c's required for for sequencing and walker management  Gait Pattern Step-to pattern;Decreased step length - right;Decreased stance time - right;Antalgic  Gait velocity slow  Stairs No  Exercises  Exercises Total Joint (hand out provided)  Total Joint Exercises  Ankle Circles/Pumps AROM;Both;5 reps;Supine  Quad Sets AROM;Right;5 reps;Supine  Heel Slides AROM;Right;5 reps;Supine  Goniometric ROM 65 AA R knee flexion  PT - End of Session  Equipment Utilized During Treatment Gait belt;Right knee immobilizer  Activity Tolerance  Patient tolerated treatment well  Patient left in chair;with call bell/phone within reach  Nurse Communication Mobility status  CPM Right Knee  CPM Right Knee Off  PT Assessment  Clinical Impression Statement Pt s/p R TKA presenting with increased R knee pain, decreased R LE strength and R knee ROM. Pt lived alone PTA and has no assist at home. Patient to benefit from SNF placement upon d/c to maximize functional recovery for safe transition home.  PT Recommendation/Assessment Patient needs continued PT services  PT Problem List Decreased strength;Decreased range of motion;Decreased activity tolerance;Decreased balance;Decreased mobility  PT Therapy Diagnosis  Difficulty walking;Abnormality of gait;Generalized weakness;Acute pain  PT Plan  PT Frequency 7X/week  PT Treatment/Interventions DME instruction;Gait training;Stair training;Functional mobility training;Therapeutic activities;Therapeutic exercise  PT Recommendation  Follow Up Recommendations Skilled nursing facility  Equipment Recommended Defer to next venue  Individuals Consulted  Consulted and Agree with Results and Recommendations Patient  Acute Rehab PT Goals  PT Goal Formulation With patient  Time For Goal Achievement 08/19/11  Potential to Achieve Goals Good  Pt will go Supine/Side to Sit with modified independence;with HOB 0 degrees  PT Goal: Supine/Side to Sit - Progress Goal set today  Pt will go Sit to Supine/Side with modified independence;with HOB 0 degrees  PT Goal: Sit to Supine/Side - Progress Goal set today  Pt will go Sit to Stand with modified independence;with upper extremity assist (up to RW.)  PT Goal: Sit to Stand - Progress Goal set today  Pt will Ambulate 51 - 150 feet;with modified independence;with rolling walker  PT Goal: Ambulate - Progress Goal set today  Pt will Perform Home Exercise Program Independently  PT Goal: Perform Home Exercise Program - Progress Goal set today  Written Expression    Dominant Hand Right     Pain: 3-4/10 R knee pain  Lewis Shock, PT, DPT Pager #: [phone] Office #: [phone]

## 2011-08-12 NOTE — Progress Notes (Signed)
Occupational Therapy Evaluation Patient Details Name: Bonnie Zhang MRN: [id number] DOB: 10/25/1940 Today's Date: 08/12/2011 Time: 8295-6213 OT Time Calculation (min): 33 min  OT Assessment / Plan / Recommendation Clinical Impression  70 yp s/p TKA. Plans to go to SNF, Will benefit from skilled Ot to facilitate D/C toSNF secondary to deficits below.    OT Assessment  Patient needs continued OT Services    Follow Up Recommendations  Skilled nursing facility    Barriers to Discharge Decreased caregiver support    Equipment Recommendations  Defer to next venue    Recommendations for Other Services    Frequency  Min 2X/week    Precautions / Restrictions Precautions Precautions: Knee Required Braces or Orthoses: Knee Immobilizer - Right Knee Immobilizer - Right: On when out of bed or walking Restrictions Weight Bearing Restrictions: Yes RLE Weight Bearing: Weight bearing as tolerated   Pertinent Vitals/Pain 4    ADL  Eating/Feeding: Simulated;Independent Grooming: Simulated;Set up Where Assessed - Grooming: Supported sitting Upper Body Bathing: Simulated;Set up Lower Body Bathing: Simulated;Moderate assistance Where Assessed - Lower Body Bathing: Sit to stand from chair Upper Body Dressing: Simulated;Set up Where Assessed - Upper Body Dressing: Sitting, chair Lower Body Dressing: Simulated;Moderate assistance Where Assessed - Lower Body Dressing: Sit to stand from chair;Unsupported Toilet Transfer: Simulated;Minimal assistance Toilet Transfer Method: Proofreader: Other (comment) (bed - chair) Toileting - Clothing Manipulation: Simulated;Supervision/safety Where Assessed - Toileting Clothing Manipulation: Standing Toileting - Hygiene: Simulated;Modified independent Where Assessed - Toileting Hygiene: Standing Tub/Shower Transfer: Not assessed Ambulation Related to ADLs: min guard ADL Comments: A for LB ADL. Plans rto go to SNF for rehab    OT Diagnosis: Generalized weakness;Acute pain  OT Problem List: Decreased strength;Decreased activity tolerance;Decreased safety awareness;Decreased knowledge of use of DME or AE;Pain OT Treatment Interventions:     OT Goals Acute Rehab OT Goals OT Goal Formulation: With patient Potential to Achieve Goals: Good ADL Goals Pt Will Perform Tub/Shower Transfer: with min assist;Maintaining weight bearing status ADL Goal: Tub/Shower Transfer - Progress: Goal set today  Visit Information  Last OT Received On: 08/12/11    Subjective Data      Prior Functioning  Home Living Lives With: Alone Available Help at Discharge:  (none) Type of Home: House Home Access: Stairs to enter [employer] of Steps: 1 Entrance Stairs-Rails: None Home Layout: One level Bathroom Shower/Tub: Tub only Firefighter: Standard Additional Comments: pt going to rehab center Prior Function Level of Independence: Independent Able to Take Stairs?: Yes Driving: Yes Vocation: Retired Musician: No difficulties Dominant Hand: Right    Cognition  Overall Cognitive Status: Appears within functional limits for tasks assessed/performed Arousal/Alertness: Awake/alert Orientation Level: Appears intact for tasks assessed Behavior During Session: [name] for tasks performed    Extremity/Trunk Assessment Right Upper Extremity Assessment RUE ROM/Strength/Tone: Within functional levels Left Upper Extremity Assessment LUE ROM/Strength/Tone: Within functional levels Trunk Assessment Trunk Assessment: Normal   Mobility Transfers Transfers: Sit to Stand;Stand to Sit Sit to Stand: 4: Min guard;With upper extremity assist;From chair/3-in-1 Stand to Sit: 4: Min guard;To bed;With upper extremity assist Details for Transfer Assistance: vc for sequence and hand placement   Exercise    Balance    End of Session OT - End of Session Equipment Utilized During Treatment: Gait belt Activity  Tolerance: Patient tolerated treatment well Patient left: in bed;with call bell/phone within reach;with family/visitor present   Bonnie Zhang 08/12/2011, 1:09 PM [facility], OTR/L  [phone] 08/12/2011

## 2011-08-13 DIAGNOSIS — D62 Acute posthemorrhagic anemia: Secondary | ICD-10-CM

## 2011-08-13 LAB — CBC
HCT: 32.6 % — ABNORMAL LOW (ref 36.0–46.0)
Hemoglobin: 10.7 g/dL — ABNORMAL LOW (ref 12.0–15.0)
MCH: 29.1 pg (ref 26.0–34.0)
MCHC: 32.8 g/dL (ref 30.0–36.0)
MCV: 88.6 fL (ref 78.0–100.0)
Platelets: 166 10*3/uL (ref 150–400)
RBC: 3.68 MIL/uL — ABNORMAL LOW (ref 3.87–5.11)
RDW: 13.3 % (ref 11.5–15.5)
WBC: 8.5 10*3/uL (ref 4.0–10.5)

## 2011-08-13 LAB — PROTIME-INR
INR: 1.51 — ABNORMAL HIGH (ref 0.00–1.49)
Prothrombin Time: 18.5 seconds — ABNORMAL HIGH (ref 11.6–15.2)

## 2011-08-13 LAB — BASIC METABOLIC PANEL
BUN: 7 mg/dL (ref 6–23)
CO2: 28 mEq/L (ref 19–32)
Calcium: 8.6 mg/dL (ref 8.4–10.5)
Chloride: 102 mEq/L (ref 96–112)
Creatinine, Ser: 0.63 mg/dL (ref 0.50–1.10)
GFR calc Af Amer: >90 mL/min (ref >90)
GFR calc non Af Amer: 89 mL/min — ABNORMAL LOW (ref >90)
Glucose, Bld: 123 mg/dL — ABNORMAL HIGH (ref 70–99)
Potassium: 3.6 mEq/L (ref 3.5–5.1)
Sodium: 138 mEq/L (ref 135–145)

## 2011-08-13 MED ORDER — WARFARIN SODIUM 5 MG PO TABS: 5.0000 mg | ORAL_TABLET | ORAL | Status: DC

## 2011-08-13 MED ORDER — DSS 100 MG PO CAPS: 100.0000 mg | ORAL_CAPSULE | Freq: Two times a day (BID) | ORAL | Status: AC

## 2011-08-13 MED ORDER — WARFARIN SODIUM 2.5 MG PO TABS
2.5000 mg | ORAL_TABLET | ORAL | Status: DC
  Filled 2011-08-13: qty 1

## 2011-08-13 MED ORDER — OXYCODONE-ACETAMINOPHEN 5-325 MG PO TABS: 1.0000 | ORAL_TABLET | ORAL | Status: AC | PRN

## 2011-08-13 MED ORDER — BISACODYL 10 MG RE SUPP
10.0000 mg | Freq: Once | RECTAL | Status: DC
  Filled 2011-08-13: qty 1

## 2011-08-13 MED ORDER — PANTOPRAZOLE SODIUM 40 MG PO TBEC: 40.0000 mg | DELAYED_RELEASE_TABLET | Freq: Every day | ORAL | Status: DC

## 2011-08-13 MED ORDER — ENOXAPARIN SODIUM 30 MG/0.3ML ~~LOC~~ SOLN: 30.0000 mg | Freq: Two times a day (BID) | SUBCUTANEOUS | Status: DC

## 2011-08-13 MED ORDER — BISACODYL 5 MG PO TBEC: 10.0000 mg | DELAYED_RELEASE_TABLET | Freq: Every day | ORAL | Status: AC | PRN

## 2011-08-13 NOTE — Progress Notes (Signed)
CSW spoke with pt who decided she will discharge to [facility] today. CSW submitted clinicals to facility and confirmed that pt can transfer with Revonda Standard at [facility]. .Clinical social worker assisted with patient discharge to skilled nursing facility. .Patient transportation provided by [employer] [facility] with patient chart copy.  .No further Clinical Social Work needs, signing off.   Catha Gosselin, Theresia Majors  [phone] .08/13/2011 1558pm

## 2011-08-13 NOTE — Progress Notes (Signed)
Patient was ordered a suppository before discharge and she refused the medication, due to getting on an ambulance.

## 2011-08-13 NOTE — Clinical Social Work Placement (Signed)
     Clinical Social Work Department CLINICAL SOCIAL WORK PLACEMENT NOTE 08/13/2011  Patient:  Bonnie Zhang, Bonnie Zhang  Account Number:  [id number] Admit date:  08/11/2011  Clinical Social Worker:  Skip Mayer  Date/time:  08/12/2011 03:30 PM  Clinical Social Work is seeking post-discharge placement for this patient at the following level of care:   SKILLED NURSING   (*CSW will update this form in Epic as items are completed)   08/12/2011  Patient/family provided with [facility] Department of Clinical Social Works list of facilities offering this level of care within the geographic area requested by the patient (or if unable, by the patients family).  08/12/2011  Patient/family informed of their freedom to choose among providers that offer the needed level of care, that participate in Medicare, Medicaid or managed care program needed by the patient, have an available bed and are willing to accept the patient.  08/12/2011  Patient/family informed of [facility] ownership interest in [facility], as well as of the fact that they are under no obligation to receive care at this facility.  PASARR submitted to EDS on 08/12/2011 PASARR number received from EDS on 08/13/2011  FL2 transmitted to all facilities in geographic area requested by pt/family on  08/12/2011 FL2 transmitted to all facilities within larger geographic area on   Patient informed that his/her managed care company has contracts with or will negotiate with  certain facilities, including the following:     Patient/family informed of bed offers received:  08/12/2011 Patient chooses bed at [facility] [place] Physician recommends and patient chooses bed at    [facility] [place] on  08/13/2011 Patient to be transferred to facility by [facility]  The following physician request were entered in Epic:   Additional Comments: FL2 sent to  [facility] only as pt registered with that SNF prior to admission.

## 2011-08-13 NOTE — Progress Notes (Signed)
CSW received call that patient was upset when ptar came to get patient. CSW was on way to unit when csw passed pt in hallway on stretcher leaving hospital. CSW spoke with pt RN, who stated that patient became upset when ptar got there. Pt had agreed to be discharged to [facility] by ambulance and pt was agreeable to transfer to [facility] this afternoon. Pt was aware that ambulance had been called and that patient needed to be at [facility] by 5pm. Pt thanked csw for all the help with setting up the discharge. Pt stated, " I know this is not your fault, and I understand being medically stable, and insurance. I'm just frustrated having had planned to be here till Thursday.". Pt thanked csw once again. Pt RN was present in pt room to help pt get dressed and ready to go to [facility].   Catha Gosselin, Theresia Majors  [phone] .08/13/2011 1635pm

## 2011-08-13 NOTE — Progress Notes (Signed)
Patient ID: CARMELITA AMPARO, female   DOB: April 01, 1940, 71 y.o.   MRN: [id number] PATIENT ID: ARMINE RIZZOLO  MRN: [id number]  DOB/AGE:  1940-11-17 / 71 y.o.  2 Days Post-Op Procedure(s) (LRB): TOTAL KNEE ARTHROPLASTY (Right)    PROGRESS NOTE Subjective: Patient is alert, oriented, no Nausea, no Vomiting, yes} passing gas, no Bowel Movement. Taking PO yes. Denies SOB, Chest or Calf Pain. Using Incentive Spirometer, PAS in place. Ambulate well, CPM 0-70 Patient reports pain as 6 on 0-10 scale  .    Objective: Vital signs in last 24 hours: Filed Vitals:   08/12/11 0558 08/12/11 1457 08/12/11 2207 08/13/11 0617  BP: 112/57 139/55 133/55 130/54  Pulse: 81 73 86 75  Temp: 98.3 F (36.8 C) 97.3 F (36.3 C) 99 F (37.2 C) 99 F (37.2 C)  TempSrc: Oral   Oral  Resp: 18 16 16 16   Height:      Weight:      SpO2: 96% 92% 90% 96%      Intake/Output from previous day: I/O last 3 completed shifts: In: 720 [P.O.:720] Out: 1150 [Urine:1100; Drains:50]   Intake/Output this shift:     LABORATORY DATA:  Basename 08/13/11 0543 08/12/11 0715  WBC 8.5 9.1  HGB 10.7* 11.4*  HCT 32.6* 33.8*  PLT 166 152  NA 138 137  K 3.6 3.7  CL 102 103  CO2 28 25  BUN 7 9  CREATININE 0.63 0.72  GLUCOSE 123* 137*  GLUCAP -- --  INR 1.51* 1.18  CALCIUM 8.6 --    Examination: Neurologically intact ABD soft Neurovascular intact Sensation intact distally Intact pulses distally Dorsiflexion/Plantar flexion intact Incision: dressing C/D/I and scant drainage}  Assessment:   2 Days Post-Op Procedure(s) (LRB): TOTAL KNEE ARTHROPLASTY (Right) ADDITIONAL DIAGNOSIS:  Acute Blood Loss Anemia, Hypertension and hx of DVT  Plan: PT/OT WBAT, CPM 0-90 6 hrs a day DVT Prophylaxis:  SCDx72hr\Lovenox DISCHARGE PLAN: Skilled Nursing Facility/Rehab DISCHARGE NEEDS: dulcolax suppository today     Trevian Hayashida J 08/13/2011, 1:36 PM

## 2011-08-13 NOTE — Progress Notes (Signed)
PT Progress Note:     08/13/11 1125  PT Visit Information  Last PT Received On 08/13/11  Assistance Needed +1  PT Time Calculation  PT Start Time 1105  PT Stop Time 1125  PT Time Calculation (min) 20 min  Precautions  Precautions Knee  Required Braces or Orthoses Knee Immobilizer - Right  Knee Immobilizer - Right On when out of bed or walking  Restrictions  Weight Bearing Restrictions Yes  RLE Weight Bearing WBAT  Cognition  Overall Cognitive Status Appears within functional limits for tasks assessed/performed  Arousal/Alertness Awake/alert  Orientation Level Appears intact for tasks assessed  Behavior During Session [facility] for tasks performed  Bed Mobility  Bed Mobility Not assessed  Transfers  Transfers Sit to Stand;Stand to Sit  Sit to Stand 4: Min guard;With upper extremity assist;With armrests;From chair/3-in-1  Stand to Sit 4: Min guard;With upper extremity assist;With armrests;To chair/3-in-1  Details for Transfer Assistance Cues for hand placement & use of UE's to control descent during stand>sit.    Ambulation/Gait  Ambulation/Gait Assistance 4: Min guard  Ambulation Distance (Feet) 60 Feet  Assistive device Rolling walker  Ambulation/Gait Assistance Details Cues for sequencing, decrease reliance of UE's on RW, & safe RW advancement.   Gait Pattern Step-to pattern  Stairs No  Exercises  Exercises Total Joint  Total Joint Exercises  Ankle Circles/Pumps AROM;Both;10 reps;Seated  Quad Sets AROM;Both;10 reps;Seated  Hip ABduction/ADduction AROM;Right;10 reps;Seated  Straight Leg Raises AAROM;Right;10 reps;Seated  Long Arc Quad AROM;Right;10 reps;Seated  Knee Flexion AAROM;Right;10 reps;Seated  Marching in Standing AROM;Strengthening;10 reps;Standing;Both  PT - End of Session  Equipment Utilized During Treatment Gait belt;Right knee immobilizer  Activity Tolerance Patient tolerated treatment well  Patient left in chair;with call bell/phone within reach  PT -  Assessment/Plan  Comments on Treatment Session Pt progressing with PT goals & mobilization.  Pt c/o nausea but agreeable to participate in session.   Reports plans are to d/c to [facility] tomorrow.    PT Plan Discharge plan remains appropriate;Frequency remains appropriate  PT Frequency 7X/week  Follow Up Recommendations Skilled nursing facility  Equipment Recommended Defer to next venue  Acute Rehab PT Goals  PT Goal: Sit to Stand - Progress Progressing toward goal  PT Goal: Ambulate - Progress Progressing toward goal  PT Goal: Perform Home Exercise Program - Progress Progressing toward goal      Verdell Face, [place] [phone] 08/13/2011

## 2011-08-13 NOTE — Progress Notes (Signed)
ANTICOAGULATION CONSULT NOTE - Follow Up Consult  Pharmacy Consult for Coumadin Indication: VTE prophylaxis, hx DVT  Allergies  Allergen Reactions  . Aspirin     REACTION: upset stomach  . Atorvastatin     REACTION: leg pain  . Hctz (Hydrochlorothiazide) Other (See Comments)    FATIGUE AND EXTREMELY LOW POTASSIUM    Patient Measurements: Height: 5' (152.4 cm) Weight: 177 lb 11.1 oz (80.6 kg) IBW/kg (Calculated) : 45.5  Heparin Dosing Weight:   Vital Signs: Temp: 99 F (37.2 C) (05/15 0617) Temp src: Oral (05/15 0617) BP: 130/54 mmHg (05/15 0617) Pulse Rate: 75  (05/15 0617)  Labs:  Basename 08/13/11 0543 08/12/11 0715 08/11/11 0628  HGB 10.7* 11.4* --  HCT 32.6* 33.8* --  PLT 166 152 --  APTT -- -- 26  LABPROT 18.5* 15.3* 13.4  INR 1.51* 1.18 1.00  HEPARINUNFRC -- -- --  CREATININE 0.63 0.72 --  CKTOTAL -- -- --  CKMB -- -- --  TROPONINI -- -- --    Estimated Creatinine Clearance: 62.3 ml/min (by C-G formula based on Cr of 0.63).  Assessment: 70yof continuing Coumadin for VTE prophylaxis s/p TKA with h/o DVT. INR (1.51) is subtherapuetic as expected but trending up appropriately. Patient reports home regimen is 2.5mg  daily except 5mg  TTSun - will restart. Patient is also receiving Lovenox 30mg  q24h until INR > 1.8 per MD orders.  Goal of Therapy:  INR 2-3 (for hx of DVT)   Plan:  1. Restart patient's home Coumadin regimen (2.5mg  daily except 5mg  TTSun) - 2.5mg  due today 2. Follow-up AM INR and discharge plans  Cleon Dew [phone] 08/13/2011,10:17 AM

## 2011-08-13 NOTE — Progress Notes (Addendum)
CSW was informed by PA that patient is medically stable for discharge to snf today instead of previously planned for discharge tomorrow. CSW spoke with pt who was stating that she and her family were planning for discharge tomorrow. Pt stated that she had been told for the past 2 months by pt MD that patient will need 3 nights in the hospital, and this was restated on Monday. CSW discussed with patient regarding insurance coverage that patient is medically stable and once a patient is deemed medically stable, a patient can be responsible for last night/day of hospital stay and billed out of pocket. Pt stated that she will discuss with her daughter and will let csw know pt decision regarding discharge today or tomorrow.   CSW confirmed with [facility] that patient can transfer to snf today if medically stable.   Pt and RN informed CSW that patient has not had a bowel movement. CSW agreed to contact PA regarding patient lack of  bowel movement.   Catha Gosselin, LCSWA  [phone] temporal arteritis.d 1442pm

## 2011-08-13 NOTE — Discharge Summary (Signed)
Patient ID: Bonnie Zhang MRN: [id number] DOB/AGE: 1940/06/18 71 y.o.  Admit date: 08/11/2011 Discharge date: 08/13/2011  Admission Diagnoses:  Principal Problem:  *Right knee DJD Active Problems:  HYPERTENSION, ESSENTIAL NOS  GERD  DVT, HX OF  Postoperative anemia due to acute blood loss   Discharge Diagnoses:  Same  Past Medical History  Diagnosis Date  . High cholesterol     takes Simvastating daily  . Vitamin d deficiency     takes VIt D daily  . DVT (deep vein thrombosis) in pregnancy      1965 post C section;2001 with prolonged driving while on  HRT  . PVD (peripheral vascular disease)     abnormal venous doppler findings due to recurrent DVT'S  . Osteoporosis   . Right knee DJD   . Hypertension     takes Benazepril,Amlodipine,and Metoprolol daily  . Hx of migraines     yrs ago   . Joint pain   . Joint swelling   . Bruises easily     pt is on Coumadin;last dose of Coumadin 08/05/11 and then Lovenox started 08/07/11  . GERD (gastroesophageal reflux disease)     occ will use TUMS  . Hx of colonic polyps     Surgeries: Procedure(s): TOTAL KNEE ARTHROPLASTY on 08/11/2011   Consultants:    Discharged Condition: Improved  Hospital Course: Bonnie Zhang is an 71 y.o. female who was admitted 08/11/2011 for operative treatment ofRight knee DJD. Patient has severe unremitting pain that affects sleep, daily activities, and work/hobbies. After pre-op clearance the patient was taken to the operating room on 08/11/2011 and underwent  Procedure(s): TOTAL KNEE ARTHROPLASTY.    Patient was given perioperative antibiotics: Anti-infectives     Start     Dose/Rate Route Frequency Ordered Stop   08/11/11 1400   ceFAZolin (ANCEF) IVPB 2 g/50 mL premix        2 g 100 mL/hr over 30 Minutes Intravenous Every 6 hours 08/11/11 1118 09/03/2011 0237   08/11/11 0804   cefUROXime (ZINACEF) injection  Status:  Discontinued          As needed 08/11/11 0805 08/11/11 0921   08/11/11 0601   ceFAZolin (ANCEF) 2-3 GM-% IVPB SOLR     Comments: Bonnie Zhang: cabinet override         08/11/11 0601 08/11/11 1814   08/10/11 1512   ceFAZolin (ANCEF) IVPB 2 g/50 mL premix        2 g 100 mL/hr over 30 Minutes Intravenous 60 min pre-op 08/10/11 1512 08/11/11 0740           Patient was given sequential compression devices, early ambulation, and chemoprophylaxis to prevent DVT.  Patient benefited maximally from hospital stay and there were no complications.    Recent vital signs: Patient Vitals for the past 24 hrs:  BP Temp Temp src Pulse Resp SpO2  08/13/11 0617 130/54 mmHg 99 F (37.2 C) Oral 75  16  96 %  Sep 03, 2011 2207 133/55 mmHg 99 F (37.2 C) - 86  16  90 %  03-Sep-2011 1457 139/55 mmHg 97.3 F (36.3 C) - 73  16  92 %     Recent laboratory studies:  Basename 08/13/11 0543 Sep 03, 2011 0715  WBC 8.5 9.1  HGB 10.7* 11.4*  HCT 32.6* 33.8*  PLT 166 152  NA 138 137  K 3.6 3.7  CL 102 103  CO2 28 25  BUN 7 9  CREATININE 0.63 0.72  GLUCOSE 123* 137*  INR 1.51* 1.18  CALCIUM 8.6 --     Discharge Medications:   Medication List  As of 08/13/2011  1:49 PM   STOP taking these medications         enoxaparin 120 MG/0.8ML injection         TAKE these medications         amLODipine 5 MG tablet   Commonly known as: NORVASC   TAKE ONE TABLET BY MOUTH EVERY DAY      benazepril 10 MG tablet   Commonly known as: LOTENSIN   TAKE ONE TABLET BY MOUTH EVERY DAY      bisacodyl 5 MG EC tablet   Commonly known as: DULCOLAX   Take 2 tablets (10 mg total) by mouth daily as needed for constipation.      DSS 100 MG Caps   Take 100 mg by mouth 2 (two) times daily.      enoxaparin 30 MG/0.3ML injection   Commonly known as: LOVENOX   Inject 0.3 mLs (30 mg total) into the skin every 12 (twelve) hours.      metoprolol 50 MG tablet   Commonly known as: LOPRESSOR   Take 25 mg by mouth daily. 1/2 by mouth once daily      oxyCODONE-acetaminophen 5-325 MG per  tablet   Commonly known as: PERCOCET   Take 1-2 tablets by mouth every 4 (four) hours as needed.      pantoprazole 40 MG tablet   Commonly known as: PROTONIX   Take 1 tablet (40 mg total) by mouth daily at 12 noon.      simvastatin 20 MG tablet   Commonly known as: ZOCOR   TAKE ONE TABLET BY MOUTH AT BEDTIME      Vitamin D 2000 UNITS Caps   Take 2,000 Units by mouth daily.      warfarin 5 MG tablet   Commonly known as: COUMADIN   Take 2.5-5 mg by mouth daily. Take 1/2 tab (2.5 mg) on Monday, Wednesday, Friday, and Saturday  Take 1 tab (5 mg) on Tuesday, Thursday, and Sunday      warfarin 5 MG tablet   Commonly known as: COUMADIN            Diagnostic Studies: Dg Chest 2 View  08/07/2011  *RADIOLOGY REPORT*  Clinical Data: Preop for right knee arthroplasty  CHEST - 2 VIEW  Comparison: None.  Findings: Cardiomediastinal silhouette is unremarkable.  No acute infiltrate or pleural effusion.  No pulmonary edema.  Post cholecystectomy surgical clips are noted.  Bony thorax is unremarkable.  IMPRESSION: No active disease.  Original Report Authenticated By: Natasha Mead, M.D.    Disposition: 01-Home or Self Care  Discharge Orders    Future Orders Please Complete By Expires   Diet - low sodium heart healthy      Call MD / Call 911      Comments:   If you experience chest pain or shortness of breath, CALL 911 and be transported to the hospital emergency room.  If you develope a fever above 101 F, pus (white drainage) or increased drainage or redness at the wound, or calf pain, call your surgeon's office.   Constipation Prevention      Comments:   Drink plenty of fluids.  Prune juice may be helpful.  You may use a stool softener, such as Colace (over the counter) 100 mg twice a day.  Use MiraLax (over the counter) for constipation as needed.   Increase activity  slowly as tolerated      Weight Bearing as taught in Physical Therapy      Comments:   Use a walker or crutches as  instructed.   Discharge instructions      Comments:   Total Knee Replacement Care After Refer to this sheet in the next few weeks. These discharge instructions provide you with general information on caring for yourself after you leave the hospital. Your caregiver may also give you specific instructions. Your treatment has been planned according to the most current medical practices available, but unavoidable complications sometimes occur. If you have any problems or questions after discharge, please call your caregiver. Regaining a near full range of motion of your knee within the first 3 to 6 weeks after surgery is critical. HOME CARE INSTRUCTIONS  You may resume a normal diet and activities as directed.  Perform exercises as directed.  Place yellow foam block, yellow side up under heel at all times except when in CPM or when walking.  DO NOT modify, tear, cut, or change in any way. You will receive physical therapy daily  Take showers instead of baths until informed otherwise.  Change bandages (dressings)daily Do not take over-the-counter or prescription medicines for pain, discomfort, or fever. Eat a well-balanced diet.  Avoid lifting or driving until you are instructed otherwise.  Make an appointment to see your caregiver for stitches (suture) or staple removal as directed.  If you have been sent home with a continuous passive motion machine (CPM machine), 0-90 degrees 6 hrs a day   2 hrs a shift SEEK MEDICAL CARE IF: You have swelling of your calf or leg.  You develop shortness of breath or chest pain.  You have redness, swelling, or increasing pain in the wound.  There is pus or any unusual drainage coming from the surgical site.  You notice a bad smell coming from the surgical site or dressing.  The surgical site breaks open after sutures or staples have been removed.  There is persistent bleeding from the suture or staple line.  You are getting worse or are not improving.  You  have any other questions or concerns.  SEEK IMMEDIATE MEDICAL CARE IF:  You have a fever.  ny way the yellow foam block.You develop a rash.  You have difficulty breathing.  You develop any reaction or side effects to medicines given.  Your knee motion is decreasing rather than improving.  MAKE SURE YOU:  Understand these instructions.  Will watch your condition.  Will get help right away if you are not doing well or get worse.      CPM      Comments:   Continuous passive motion machine (CPM):      Use the CPM from 0 to 90 for 6 hours per day.       You may break it up into 2 or 3 sessions per day.      Use CPM for 2 weeks or until you are told to stop.   TED hose      Comments:   Use stockings (TED hose) for 2 weeks on both leg(s).  You may remove them at night for sleeping.   Change dressing      Comments:   Change the dressing daily with sterile 4 x 4 inch gauze dressing and apply TED hose.  You may clean the incision with alcohol prior to redressing.   Do not put a pillow under the knee. Place it under  the heel.      Comments:   Place yellow foam block, yellow side up under heel at all times except when in CPM or when walking.  DO NOT modify, tear, cut, or change in any way the yellow foam block.      Follow-up Information    Follow up with Nilda Simmer, MD on 08/26/2011. (appt time 10:30)    Contact information:   [facility] [address] [place] [address] [phone]           Signed: Pascal Lux 08/13/2011, 1:49 PM

## 2011-08-27 ENCOUNTER — Ambulatory Visit: Payer: Self-pay | Admitting: Internal Medicine

## 2011-08-27 DIAGNOSIS — Z86718 Personal history of other venous thrombosis and embolism: Secondary | ICD-10-CM

## 2011-08-27 DIAGNOSIS — Z7901 Long term (current) use of anticoagulants: Secondary | ICD-10-CM

## 2011-08-27 LAB — POCT INR: INR: 2.7

## 2011-09-01 ENCOUNTER — Telehealth: Payer: Self-pay | Admitting: Internal Medicine

## 2011-09-01 MED ORDER — PANTOPRAZOLE SODIUM 40 MG PO TBEC: 40.0000 mg | DELAYED_RELEASE_TABLET | Freq: Every day | ORAL | Status: DC

## 2011-09-01 NOTE — Telephone Encounter (Signed)
Caller: Natascha/Patient; PCP: Marga Melnick; CB#: [phone]; ; ; Call regarding Medication Question;  Marlenne had knee replacement surgery on 08/11/11.  Stayed at Nursing Facility for rehab afterwards.  While at facility, was placed on Protonix 40mg  QD for reflux.  Is now at home and out of medication.  Requesting script to be called in for Protonix or generic to [facility] [phone].  Indigestion has returned since stopping Protonix.  Having frequent episodes again.  Utilized Heartburn Guideline.  See PCP within 72 hrs disposition.  Declines appt and requesting note sent to PCP.

## 2011-09-01 NOTE — Telephone Encounter (Signed)
RX sent

## 2011-09-01 NOTE — Telephone Encounter (Signed)
OK #30, R X2 

## 2011-09-03 ENCOUNTER — Ambulatory Visit: Payer: Self-pay | Admitting: Internal Medicine

## 2011-09-03 DIAGNOSIS — Z7901 Long term (current) use of anticoagulants: Secondary | ICD-10-CM

## 2011-09-03 DIAGNOSIS — Z86718 Personal history of other venous thrombosis and embolism: Secondary | ICD-10-CM

## 2011-09-03 LAB — POCT INR: INR: 3.6

## 2011-09-10 ENCOUNTER — Ambulatory Visit: Payer: Self-pay | Admitting: Internal Medicine

## 2011-09-10 DIAGNOSIS — Z86718 Personal history of other venous thrombosis and embolism: Secondary | ICD-10-CM

## 2011-09-10 DIAGNOSIS — Z7901 Long term (current) use of anticoagulants: Secondary | ICD-10-CM

## 2011-09-10 LAB — POCT INR: INR: 3.2

## 2011-09-17 ENCOUNTER — Ambulatory Visit: Payer: Self-pay | Admitting: Cardiology

## 2011-09-17 DIAGNOSIS — Z7901 Long term (current) use of anticoagulants: Secondary | ICD-10-CM

## 2011-09-17 DIAGNOSIS — Z86718 Personal history of other venous thrombosis and embolism: Secondary | ICD-10-CM

## 2011-09-17 LAB — POCT INR: INR: 3.3

## 2011-09-24 ENCOUNTER — Ambulatory Visit: Payer: Self-pay | Admitting: Cardiology

## 2011-09-24 DIAGNOSIS — Z86718 Personal history of other venous thrombosis and embolism: Secondary | ICD-10-CM

## 2011-09-24 DIAGNOSIS — Z7901 Long term (current) use of anticoagulants: Secondary | ICD-10-CM

## 2011-09-24 LAB — POCT INR: INR: 2.5

## 2011-10-05 ENCOUNTER — Other Ambulatory Visit: Payer: Self-pay | Admitting: Internal Medicine

## 2011-10-06 NOTE — Telephone Encounter (Signed)
Refill done.  

## 2011-10-08 ENCOUNTER — Ambulatory Visit (INDEPENDENT_AMBULATORY_CARE_PROVIDER_SITE_OTHER): Payer: Medicare Other | Admitting: Pharmacist

## 2011-10-08 DIAGNOSIS — Z86718 Personal history of other venous thrombosis and embolism: Secondary | ICD-10-CM

## 2011-10-08 DIAGNOSIS — Z7901 Long term (current) use of anticoagulants: Secondary | ICD-10-CM

## 2011-10-08 LAB — POCT INR: INR: 1.9

## 2011-10-23 ENCOUNTER — Ambulatory Visit (INDEPENDENT_AMBULATORY_CARE_PROVIDER_SITE_OTHER): Payer: Medicare Other | Admitting: Pharmacist

## 2011-10-23 DIAGNOSIS — Z86718 Personal history of other venous thrombosis and embolism: Secondary | ICD-10-CM

## 2011-10-23 DIAGNOSIS — Z7901 Long term (current) use of anticoagulants: Secondary | ICD-10-CM

## 2011-10-23 LAB — POCT INR: INR: 2.2

## 2011-11-13 ENCOUNTER — Ambulatory Visit (INDEPENDENT_AMBULATORY_CARE_PROVIDER_SITE_OTHER): Payer: Medicare Other

## 2011-11-13 DIAGNOSIS — Z7901 Long term (current) use of anticoagulants: Secondary | ICD-10-CM

## 2011-11-13 DIAGNOSIS — Z86718 Personal history of other venous thrombosis and embolism: Secondary | ICD-10-CM

## 2011-11-13 LAB — POCT INR: INR: 2

## 2011-12-03 ENCOUNTER — Encounter: Payer: Self-pay | Admitting: Pharmacist

## 2011-12-11 ENCOUNTER — Ambulatory Visit (INDEPENDENT_AMBULATORY_CARE_PROVIDER_SITE_OTHER): Payer: Medicare Other | Admitting: Pharmacist

## 2011-12-11 DIAGNOSIS — Z86718 Personal history of other venous thrombosis and embolism: Secondary | ICD-10-CM

## 2011-12-11 DIAGNOSIS — Z7901 Long term (current) use of anticoagulants: Secondary | ICD-10-CM

## 2011-12-11 LAB — POCT INR: INR: 2

## 2012-01-02 ENCOUNTER — Encounter: Payer: Self-pay | Admitting: Internal Medicine

## 2012-01-04 ENCOUNTER — Other Ambulatory Visit: Payer: Self-pay | Admitting: Internal Medicine

## 2012-01-08 ENCOUNTER — Ambulatory Visit (INDEPENDENT_AMBULATORY_CARE_PROVIDER_SITE_OTHER): Payer: Medicare Other | Admitting: General Practice

## 2012-01-08 DIAGNOSIS — Z86718 Personal history of other venous thrombosis and embolism: Secondary | ICD-10-CM

## 2012-01-08 DIAGNOSIS — Z7901 Long term (current) use of anticoagulants: Secondary | ICD-10-CM

## 2012-01-08 LAB — POCT INR: INR: 1.7

## 2012-01-28 ENCOUNTER — Ambulatory Visit (INDEPENDENT_AMBULATORY_CARE_PROVIDER_SITE_OTHER): Payer: Medicare Other | Admitting: General Practice

## 2012-01-28 DIAGNOSIS — Z86718 Personal history of other venous thrombosis and embolism: Secondary | ICD-10-CM

## 2012-01-28 DIAGNOSIS — Z7901 Long term (current) use of anticoagulants: Secondary | ICD-10-CM

## 2012-01-28 LAB — POCT INR: INR: 1.9

## 2012-02-18 ENCOUNTER — Ambulatory Visit (INDEPENDENT_AMBULATORY_CARE_PROVIDER_SITE_OTHER): Payer: Medicare Other | Admitting: General Practice

## 2012-02-18 DIAGNOSIS — Z86718 Personal history of other venous thrombosis and embolism: Secondary | ICD-10-CM

## 2012-02-18 DIAGNOSIS — Z7901 Long term (current) use of anticoagulants: Secondary | ICD-10-CM

## 2012-02-18 LAB — POCT INR: INR: 2

## 2012-02-22 ENCOUNTER — Other Ambulatory Visit: Payer: Self-pay | Admitting: Cardiovascular Disease

## 2012-02-23 ENCOUNTER — Other Ambulatory Visit: Payer: Self-pay | Admitting: *Deleted

## 2012-02-23 MED ORDER — WARFARIN SODIUM 5 MG PO TABS: 5.0000 mg | ORAL_TABLET | Freq: Every day | ORAL | Status: DC

## 2012-02-24 ENCOUNTER — Other Ambulatory Visit: Payer: Self-pay | Admitting: General Practice

## 2012-03-07 ENCOUNTER — Other Ambulatory Visit: Payer: Self-pay | Admitting: Internal Medicine

## 2012-03-17 ENCOUNTER — Ambulatory Visit (INDEPENDENT_AMBULATORY_CARE_PROVIDER_SITE_OTHER): Payer: Medicare Other | Admitting: General Practice

## 2012-03-17 DIAGNOSIS — Z86718 Personal history of other venous thrombosis and embolism: Secondary | ICD-10-CM

## 2012-03-17 DIAGNOSIS — Z7901 Long term (current) use of anticoagulants: Secondary | ICD-10-CM

## 2012-03-17 LAB — POCT INR: INR: 2

## 2012-04-01 ENCOUNTER — Encounter: Payer: Medicare Other | Admitting: Internal Medicine

## 2012-04-05 ENCOUNTER — Other Ambulatory Visit: Payer: Self-pay | Admitting: Internal Medicine

## 2012-04-13 ENCOUNTER — Encounter: Payer: Self-pay | Admitting: Internal Medicine

## 2012-04-13 ENCOUNTER — Ambulatory Visit (INDEPENDENT_AMBULATORY_CARE_PROVIDER_SITE_OTHER): Payer: Medicare Other | Admitting: Internal Medicine

## 2012-04-13 VITALS — BP 126/82 | HR 86 | Temp 97.7°F | Resp 12 | Ht 60.08 in | Wt 175.0 lb

## 2012-04-13 DIAGNOSIS — E559 Vitamin D deficiency, unspecified: Secondary | ICD-10-CM

## 2012-04-13 DIAGNOSIS — I10 Essential (primary) hypertension: Secondary | ICD-10-CM

## 2012-04-13 DIAGNOSIS — E782 Mixed hyperlipidemia: Secondary | ICD-10-CM

## 2012-04-13 DIAGNOSIS — K219 Gastro-esophageal reflux disease without esophagitis: Secondary | ICD-10-CM

## 2012-04-13 DIAGNOSIS — Z0000 Encounter for general adult medical examination without abnormal findings: Secondary | ICD-10-CM

## 2012-04-13 DIAGNOSIS — M810 Age-related osteoporosis without current pathological fracture: Secondary | ICD-10-CM

## 2012-04-13 LAB — CBC WITH DIFFERENTIAL/PLATELET
Basophils Absolute: 0 10*3/uL (ref 0.0–0.1)
Basophils Relative: 0.3 % (ref 0.0–3.0)
Eosinophils Absolute: 0.1 10*3/uL (ref 0.0–0.7)
Eosinophils Relative: 0.8 % (ref 0.0–5.0)
HCT: 44.1 % (ref 36.0–46.0)
Hemoglobin: 14.9 g/dL (ref 12.0–15.0)
Lymphocytes Relative: 24.8 % (ref 12.0–46.0)
Lymphs Abs: 1.9 10*3/uL (ref 0.7–4.0)
MCHC: 33.7 g/dL (ref 30.0–36.0)
MCV: 85.5 fl (ref 78.0–100.0)
Monocytes Absolute: 0.5 10*3/uL (ref 0.1–1.0)
Monocytes Relative: 6.3 % (ref 3.0–12.0)
Neutro Abs: 5.2 10*3/uL (ref 1.4–7.7)
Neutrophils Relative %: 67.8 % (ref 43.0–77.0)
Platelets: 224 10*3/uL (ref 150.0–400.0)
RBC: 5.15 Mil/uL — ABNORMAL HIGH (ref 3.87–5.11)
RDW: 13.2 % (ref 11.5–14.6)
WBC: 7.6 10*3/uL (ref 4.5–10.5)

## 2012-04-13 LAB — BASIC METABOLIC PANEL
BUN: 14 mg/dL (ref 6–23)
CO2: 27 mEq/L (ref 19–32)
Calcium: 9.6 mg/dL (ref 8.4–10.5)
Chloride: 106 mEq/L (ref 96–112)
Creatinine, Ser: 0.8 mg/dL (ref 0.4–1.2)
GFR: 77.46 mL/min (ref >60.00)
Glucose, Bld: 109 mg/dL — ABNORMAL HIGH (ref 70–99)
Potassium: 4.1 mEq/L (ref 3.5–5.1)
Sodium: 140 mEq/L (ref 135–145)

## 2012-04-13 LAB — LIPID PANEL
Cholesterol: 175 mg/dL (ref 0–200)
HDL: 66.8 mg/dL (ref >39.00)
LDL Cholesterol: 78 mg/dL (ref 0–99)
Total CHOL/HDL Ratio: 3
Triglycerides: 150 mg/dL — ABNORMAL HIGH (ref 0.0–149.0)
VLDL: 30 mg/dL (ref 0.0–40.0)

## 2012-04-13 LAB — HEPATIC FUNCTION PANEL
ALT: 13 U/L (ref 0–35)
AST: 19 U/L (ref 0–37)
Albumin: 4.3 g/dL (ref 3.5–5.2)
Alkaline Phosphatase: 87 U/L (ref 39–117)
Bilirubin, Direct: 0.1 mg/dL (ref 0.0–0.3)
Total Bilirubin: 0.6 mg/dL (ref 0.3–1.2)
Total Protein: 8 g/dL (ref 6.0–8.3)

## 2012-04-13 LAB — TSH: TSH: 1.68 u[IU]/mL (ref 0.35–5.50)

## 2012-04-13 NOTE — Progress Notes (Signed)
Subjective:    Patient ID: Bonnie Zhang, female    DOB: 19-Apr-1940, 72 y.o.   MRN: [id number]  HPI Medicare Wellness Visit:  The following psychosocial & medical history were reviewed as required by Medicare.   Social history: caffeine: minimal , alcohol:  no ,  tobacco use : never  & exercise : minimal due to knee surgery.   Home & personal  safety / fall risk: no issues, activities of daily living:no limitations , seatbelt use : yes , and smoke alarm employment : yes .  Power of Attorney/Living Will status : Living Will only  Vision ( as recorded per Nurse) & Hearing  evaluation :  Ophth exam 2012; no hearing exam. Orientation :oriented X 3 , memory & recall :good, spelling  testing: good,and mood & affect : normal . Depression / anxiety: denied Travel history : never, immunization status :no current immunizations , transfusion history:  no, and preventive health surveillance ( colonoscopies, BMD , etc as per protocol/ [facility]): colonoscopy as Dr Jarold Motto, Dental care:  Every 6 mos . Chart reviewed &  Updated. Active issues reviewed & addressed.       Review of Systems HYPERTENSION: Disease Monitoring: Blood pressure range-132-5/80 Chest pain, palpitations- no       Dyspnea- no Medications: Compliance- yes  Lightheadedness,Syncope- no   Edema- occasionally  FASTING HYPERGLYCEMIA, PMH of:  Polyuria/phagia/dipsia- no       Visual problems- no  HYPERLIPIDEMIA: Disease Monitoring: See symptoms for Hypertension Medications: Compliance- yes  Abd pain, bowel changes- no   Muscle aches- no        Objective:   Physical Exam Gen.:  well-nourished in appearance. Alert, appropriate and cooperative throughout exam. Appears younger than stated age  Head: Normocephalic without obvious abnormalities  Eyes: No corneal or conjunctival inflammation noted. Pupils equal round reactive to light and accommodation. Fundal exam is benign without hemorrhages, exudate, papilledema.  Extraocular motion intact. Vision grossly normal with lenses. Ears: External  ear exam reveals no significant lesions or deformities. Canals clear .TMs normal. Hearing is grossly normal bilaterally. Nose: External nasal exam reveals no deformity or inflammation. Nasal mucosa are pink and moist. No lesions or exudates noted. Septum  Minimally dislocated Mouth: Oral mucosa and oropharynx reveal no lesions or exudates. Teeth in good repair. Neck: No deformities, masses, or tenderness noted. Range of motion & Thyroid normal. Lungs: Normal respiratory effort; chest expands symmetrically. Lungs are clear to auscultation without rales, wheezes, or increased work of breathing. Heart: Normal rate and rhythm. Normal S1 and S2. No gallop, click, or rub. S4 w/o murmur. Abdomen: Bowel sounds normal; abdomen soft and nontender. No masses, organomegaly or hernias noted. Rogelia Boga, MD                                  Musculoskeletal/extremities: Accentuated curvature of upper thoracic  spine.  No clubbing, cyanosis, edema, or significant extremity  deformity noted. Range of motion normal .Tone & strength  normal.Joints reveal minor  DJD DIP changes. Nail health good. Able to lie down & sit up w/o help. Negative SLR bilaterally. Decreased ROM of knees Vascular: Carotid, radial artery, dorsalis pedis and  posterior tibial pulses are full and equal. No bruits present. Neurologic: Alert and oriented x3. Deep tendon reflexes symmetrical and normal.  Skin: Intact without suspicious lesions or rashes. Lymph: No cervical, axillary lymphadenopathy present. Psych: Mood and affect are normal. Normally interactive  Assessment & Plan:  #1 Medicare Wellness Exam; criteria met ; data entered #2 Problem List reviewed ; Assessment/ Recommendations made Plan: see Orders

## 2012-04-13 NOTE — Patient Instructions (Addendum)
Preventive Health Care: Walking is especially valuable in preventing Osteoporosis. Eat a low-fat diet with lots of fruits and vegetables, up to 7-9 servings per day.  Consume less than 30 grams of sugar per day from foods & drinks with High Fructose Corn Syrup as #1,2,3 or #4 on label. Health Care Power of Attorney  places you in charge of your health care  decisions. Verify this is  in place. Blood Pressure Goal  Ideally is an AVERAGE < 135/85. This AVERAGE should be calculated from @ least 5-7 BP readings taken @ different times of day on different days of week. You should not respond to isolated BP readings , but rather the AVERAGE for that week

## 2012-04-14 ENCOUNTER — Ambulatory Visit (INDEPENDENT_AMBULATORY_CARE_PROVIDER_SITE_OTHER): Payer: Medicare Other | Admitting: General Practice

## 2012-04-14 DIAGNOSIS — Z86718 Personal history of other venous thrombosis and embolism: Secondary | ICD-10-CM

## 2012-04-14 DIAGNOSIS — Z7901 Long term (current) use of anticoagulants: Secondary | ICD-10-CM

## 2012-04-14 LAB — POCT INR: INR: 3

## 2012-04-15 ENCOUNTER — Encounter: Payer: Self-pay | Admitting: Gastroenterology

## 2012-04-18 LAB — VITAMIN D 1,25 DIHYDROXY
Vitamin D 1, 25 (OH)2 Total: 76 pg/mL — ABNORMAL HIGH (ref 18–72)
Vitamin D2 1, 25 (OH)2: 10 pg/mL
Vitamin D3 1, 25 (OH)2: 66 pg/mL

## 2012-04-19 ENCOUNTER — Ambulatory Visit: Payer: Medicare Other

## 2012-04-19 DIAGNOSIS — R7309 Other abnormal glucose: Secondary | ICD-10-CM

## 2012-04-19 LAB — HEMOGLOBIN A1C: Hgb A1c MFr Bld: 5.4 % (ref 4.6–6.5)

## 2012-04-23 ENCOUNTER — Encounter: Payer: Self-pay | Admitting: Gastroenterology

## 2012-05-18 ENCOUNTER — Ambulatory Visit (INDEPENDENT_AMBULATORY_CARE_PROVIDER_SITE_OTHER): Payer: Medicare Other | Admitting: General Practice

## 2012-05-18 DIAGNOSIS — Z86718 Personal history of other venous thrombosis and embolism: Secondary | ICD-10-CM

## 2012-05-18 DIAGNOSIS — Z7901 Long term (current) use of anticoagulants: Secondary | ICD-10-CM

## 2012-05-18 LAB — POCT INR: INR: 3.1

## 2012-06-09 ENCOUNTER — Ambulatory Visit (INDEPENDENT_AMBULATORY_CARE_PROVIDER_SITE_OTHER): Payer: Medicare Other | Admitting: General Practice

## 2012-06-09 DIAGNOSIS — Z86718 Personal history of other venous thrombosis and embolism: Secondary | ICD-10-CM

## 2012-06-09 DIAGNOSIS — Z7901 Long term (current) use of anticoagulants: Secondary | ICD-10-CM

## 2012-06-09 LAB — POCT INR: INR: 2.8

## 2012-07-06 ENCOUNTER — Ambulatory Visit (INDEPENDENT_AMBULATORY_CARE_PROVIDER_SITE_OTHER): Payer: Medicare Other | Admitting: Family Medicine

## 2012-07-06 ENCOUNTER — Encounter: Payer: Self-pay | Admitting: Family Medicine

## 2012-07-06 VITALS — BP 147/82 | HR 74 | Ht 60.5 in | Wt 179.0 lb

## 2012-07-06 DIAGNOSIS — N950 Postmenopausal bleeding: Secondary | ICD-10-CM

## 2012-07-06 NOTE — Progress Notes (Signed)
Had episode of post menopausal spotting this past weekend. Had a little cramping, "menstrual like".  Had similar episode two years ago that resulted in having a D&C .

## 2012-07-06 NOTE — Progress Notes (Signed)
  Subjective:    Patient ID: Bonnie Zhang, female    DOB: Oct 31, 1940, 72 y.o.   MRN: [id number]  HPI  Here for postmenopausal bleeding.  Had spotting 3 days ago assoc. With cramping.  Previous w/u for PMB included failed office EMB x 2 , one with cytotec and D & C with removal of large benign polyp.  Pathology reviewed again.  She is on lifelong coumadin.  Review of Systems  Constitutional: Negative for fatigue and unexpected weight change.  Gastrointestinal: Negative for abdominal pain, constipation and abdominal distention.  Genitourinary: Positive for vaginal bleeding. Negative for dysuria.       Objective:   Physical Exam  Constitutional: She appears well-developed and well-nourished.  HENT:  Head: Normocephalic and atraumatic.  Eyes: No scleral icterus.  Neck: Neck supple.  Cardiovascular: Normal rate.   Pulmonary/Chest: She is in respiratory distress.  Abdominal: Soft.  Skin: Skin is warm and dry.  Psychiatric: She has a normal mood and affect.          Assessment & Plan:

## 2012-07-06 NOTE — Patient Instructions (Signed)
Postmenopausal Bleeding Menopause is commonly referred to as the "change in life." It is a time when the fertile years, the time of ovulating and having menstrual periods, has come to an end. It is also determined by not having menstrual periods for 12 months.  Postmenopausal bleeding is any bleeding a woman has after she has entered into menopause. Any type of postmenopausal bleeding, even if it appears to be a typical menstrual period, is concerning. This should be evaluated by your caregiver.  CAUSES   Hormone therapy.  Cancer of the cervix or cancer of the lining of the uterus (endometrial cancer).  Thinning of the uterine lining (uterine atrophy).  Thyroid diseases.  Certain medicines.  Infection of the uterus or cervix.  Inflammation or irritation of the uterine lining (endometritis).  Estrogen-secreting tumors.  Growths (polyps) on the cervix, uterine lining, or uterus.  Uterine tumors (fibroids).  Being very overweight (obese). DIAGNOSIS  Your caregiver will take a medical history and ask questions. A physical exam will also be performed. Further tests may include:   A transvaginal ultrasound. An ultrasound wand or probe is inserted into your vagina to view the pelvic organs.  A biopsy of the lining of the uterus (endometrium). A sample of the endometrium is removed and examined.  A hysteroscopy. Your caregiver may use an instrument with a light and a camera attached to it (hysteroscope). The hysteroscope is used to look inside the uterus for problems.  A dilation and curettage (D&C). Tissue is removed from the uterine lining to be examined for problems. TREATMENT  Treatment depends on the cause of the bleeding. Some treatments include:   Surgery.  Medicines.  Hormones.  A hysteroscopy or D&C to remove polyps or fibroids.  Changing or stopping a current medicine you are taking. Talk to your caregiver about your specific treatment. HOME CARE INSTRUCTIONS    Maintain a healthy weight.  Keep regular pelvic exams and Pap tests. SEEK MEDICAL CARE IF:   You have bleeding, even if it is light in comparison to your previous periods.  Your bleeding lasts more than 1 week.  You have abdominal pain.  You develop bleeding with sexual intercourse. SEEK IMMEDIATE MEDICAL CARE IF:   You have a fever, chills, headache, dizziness, muscle aches, and bleeding.  You have severe pain with bleeding.  You are passing blood clots.  You have bleeding and need more than 1 pad an hour.  You feel faint. MAKE SURE YOU:  Understand these instructions.  Will watch your condition.  Will get help right away if you are not doing well or get worse. Document Released: 06/25/2005 Document Revised: 06/09/2011 Document Reviewed: 11/21/2010 [employer] Patient Information 2013 [employer].  

## 2012-07-06 NOTE — Assessment & Plan Note (Signed)
Will get pelvic sono.  If normal will watch, if abnormal or bleeding continues will perform another D & C

## 2012-07-07 ENCOUNTER — Ambulatory Visit (INDEPENDENT_AMBULATORY_CARE_PROVIDER_SITE_OTHER): Payer: Medicare Other | Admitting: General Practice

## 2012-07-07 DIAGNOSIS — Z86718 Personal history of other venous thrombosis and embolism: Secondary | ICD-10-CM

## 2012-07-07 DIAGNOSIS — Z7901 Long term (current) use of anticoagulants: Secondary | ICD-10-CM

## 2012-07-07 LAB — POCT INR: INR: 2.8

## 2012-07-12 ENCOUNTER — Other Ambulatory Visit: Payer: Self-pay | Admitting: Family Medicine

## 2012-07-12 DIAGNOSIS — N950 Postmenopausal bleeding: Secondary | ICD-10-CM

## 2012-07-13 ENCOUNTER — Ambulatory Visit (HOSPITAL_COMMUNITY)
Admission: RE | Admit: 2012-07-13 | Discharge: 2012-07-13 | Disposition: A | Discharge status: Home / Self Care | Payer: Medicare Other | Source: Ambulatory Visit | Attending: Family Medicine | Admitting: Family Medicine

## 2012-07-13 DIAGNOSIS — D259 Leiomyoma of uterus, unspecified: Secondary | ICD-10-CM | POA: Insufficient documentation

## 2012-07-13 DIAGNOSIS — R109 Unspecified abdominal pain: Secondary | ICD-10-CM | POA: Insufficient documentation

## 2012-07-13 DIAGNOSIS — N949 Unspecified condition associated with female genital organs and menstrual cycle: Secondary | ICD-10-CM | POA: Insufficient documentation

## 2012-07-13 DIAGNOSIS — N950 Postmenopausal bleeding: Secondary | ICD-10-CM

## 2012-07-14 ENCOUNTER — Telehealth: Payer: Self-pay | Admitting: *Deleted

## 2012-07-14 MED ORDER — MISOPROSTOL 200 MCG PO TABS: ORAL_TABLET | ORAL | Status: DC

## 2012-07-14 NOTE — Telephone Encounter (Signed)
Message copied by Barbara Cower on Wed Jul 14, 2012  2:40 PM ------      Message from: Reva Bores      Created: Wed Jul 14, 2012  1:25 PM       U/s shows thickened endometrium.  Will need D and C.  Also to take cytotec 200 mcg evening prior to procedure and morning of.  Please inform pt.  Will have [place] schedule and she will send her a letter. ------

## 2012-07-20 ENCOUNTER — Encounter: Payer: Self-pay | Admitting: Family Medicine

## 2012-07-20 ENCOUNTER — Ambulatory Visit (INDEPENDENT_AMBULATORY_CARE_PROVIDER_SITE_OTHER): Payer: Medicare Other | Admitting: Family Medicine

## 2012-07-20 ENCOUNTER — Ambulatory Visit: Payer: Medicare Other | Admitting: Family Medicine

## 2012-07-20 VITALS — BP 126/73 | HR 74 | Wt 180.0 lb

## 2012-07-20 DIAGNOSIS — N950 Postmenopausal bleeding: Secondary | ICD-10-CM

## 2012-07-20 MED ORDER — AMOXICILLIN 500 MG PO CAPS: 500.0000 mg | ORAL_CAPSULE | Freq: Once | ORAL | Status: DC

## 2012-07-20 NOTE — Progress Notes (Signed)
  Subjective:    Patient ID: Bonnie Zhang, female    DOB: 05-Feb-1941, 72 y.o.   MRN: [id number]  HPI  Here to f/u u/s.  This reveals 1.5 cm calcified fibroid and thickened endometrial stripe of 9 mm.  Second episode of PMB, had polypectomy last time.  On chronic coumadin for DVT.  Review of Systems  Constitutional: Negative for fever and chills.  Gastrointestinal: Negative for abdominal pain.  Genitourinary: Negative for vaginal bleeding.       Objective:   Physical Exam  Vitals reviewed. Constitutional: She appears well-developed and well-nourished.  HENT:  Head: Normocephalic and atraumatic.  Eyes: No scleral icterus.  Neck: Neck supple.  Cardiovascular: Normal rate.   Pulmonary/Chest: Effort normal. She has no wheezes.  Abdominal: Soft. There is no tenderness.          Assessment & Plan:

## 2012-07-20 NOTE — Assessment & Plan Note (Signed)
For D & C with hysteroscopy.  Will premedicate with Cytotec and Amoxil for knee replacement.  To stop coumadin.  She will discuss with clinic.  She is interested in pursuing hysterectomy if pathology is benign.  3 prior Sections, consider Lap SCH with BSO.

## 2012-07-20 NOTE — Progress Notes (Signed)
Here today for surgery consult, ultrasound showed thickened endometrium.

## 2012-07-20 NOTE — Patient Instructions (Signed)
Hysteroscopy Hysteroscopy is a procedure used for looking inside the womb (uterus). It may be done for many different reasons, including:  To evaluate abnormal bleeding, fibroid (benign, noncancerous) tumors, polyps, scar tissue (adhesions), and possibly cancer of the uterus.  To look for lumps (tumors) and other uterine growths.  To look for causes of why a woman cannot get pregnant (infertility), causes of recurrent loss of pregnancy (miscarriages), or a lost intrauterine device (IUD).  To perform a sterilization by blocking the fallopian tubes from inside the uterus. A hysteroscopy should be done right after a menstrual period to be sure you are not pregnant. LET YOUR CAREGIVER KNOW ABOUT:   Allergies.  Medicines taken, including herbs, eyedrops, over-the-counter medicines, and creams.  Use of steroids (by mouth or creams).  Previous problems with anesthetics or numbing medicines.  History of bleeding or blood problems.  History of blood clots.  Possibility of pregnancy, if this applies.  Previous surgery.  Other health problems. RISKS AND COMPLICATIONS   Putting a hole in the uterus.  Excessive bleeding.  Infection.  Damage to the cervix.  Injury to other organs.  Allergic reaction to medicines.  Too much fluid used in the uterus for the procedure. BEFORE THE PROCEDURE   Do not take aspirin or blood thinners for a week before the procedure, or as directed. It can cause bleeding.  Arrive at least 60 minutes before the procedure or as directed to read and sign the necessary forms.  Arrange for someone to take you home after the procedure.  If you smoke, do not smoke for 2 weeks before the procedure. PROCEDURE   Your caregiver may give you medicine to relax you. He or she may also give you a medicine that numbs the area around the cervix (local anesthetic) or a medicine that makes you sleep (general anesthesia).  Sometimes, a medicine is placed in the cervix  the day before the procedure. This medicine makes the cervix have a larger opening (dilate). This makes it easier for the instrument to be inserted into the uterus.  A small instrument (hysteroscope) is inserted through the vagina into the uterus. This instrument is similar to a pencil-sized telescope with a light.  During the procedure, air or a liquid is put into the uterus, which allows the surgeon to see better.  Sometimes, tissue is gently scraped from inside the uterus. These tissue samples are sent to a specialist who looks at tissue samples (pathologist). The pathologist will give a report to your caregiver. This will help your caregiver decide if further treatment is necessary. The report will also help your caregiver decide on the best treatment if the test comes back abnormal. AFTER THE PROCEDURE   If you had a general anesthetic, you may be groggy for a couple hours after the procedure.  If you had a local anesthetic, you will be advised to rest at the surgical center or caregiver's office until you are stable and feel ready to go home.  You may have some cramping for a couple days.  You may have bleeding, which varies from light spotting for a few days to menstrual-like bleeding for up to 3 to 7 days. This is normal.  Have someone take you home. FINDING OUT THE RESULTS OF YOUR TEST Not all test results are available during your visit. If your test results are not back during the visit, make an appointment with your caregiver to find out the results. Do not assume everything is normal if you   have not heard from your caregiver or the medical facility. It is important for you to follow up on all of your test results. HOME CARE INSTRUCTIONS   Do not drive for 24 hours or as instructed.  Only take over-the-counter or prescription medicines for pain, discomfort, or fever as directed by your caregiver.  Do not take aspirin. It can cause or aggravate bleeding.  Do not drive or drink  alcohol while taking pain medicine.  You may resume your usual diet.  Do not use tampons, douche, or have sexual intercourse for 2 weeks, or as advised by your caregiver.  Rest and sleep for the first 24 to 48 hours.  Take your temperature twice a day for 4 to 5 days. Write it down. Give these temperatures to your caregiver if they are abnormal (above 98.6 F or 37.0 C).  Take medicines your caregiver has ordered as directed.  Follow your caregiver's advice regarding diet, exercise, lifting, driving, and general activities.  Take showers instead of baths for 2 weeks, or as recommended by your caregiver.  If you develop constipation:  Take a mild laxative with the advice of your caregiver.  Eat bran foods.  Drink enough water and fluids to keep your urine clear or pale yellow.  Try to have someone with you or available to you for the first 24 to 48 hours, especially if you had a general anesthetic.  Make sure you and your family understand everything about your operation and recovery.  Follow your caregiver's advice regarding follow-up appointments and Pap smears. SEEK MEDICAL CARE IF:   You feel dizzy or lightheaded.  You feel sick to your stomach (nauseous).  You develop abnormal vaginal discharge.  You develop a rash.  You have an abnormal reaction or allergy to your medicine.  You need stronger pain medicine. SEEK IMMEDIATE MEDICAL CARE IF:   Bleeding is heavier than a normal menstrual period or you have blood clots.  You have an oral temperature above 102 F (38.9 C), not controlled by medicine.  You have increasing cramps or pains not relieved with medicine.  You develop belly (abdominal) pain that does not seem to be related to the same area of earlier cramping and pain.  You pass out.  You develop pain in the tops of your shoulders (shoulder strap areas).  You develop shortness of breath. MAKE SURE YOU:   Understand these instructions.  Will watch  your condition.  Will get help right away if you are not doing well or get worse. Document Released: 06/23/2000 Document Revised: 06/09/2011 Document Reviewed: 10/16/2008 [facility] Patient Information 2013 [place], [place].

## 2012-07-21 ENCOUNTER — Other Ambulatory Visit: Payer: Self-pay | Admitting: General Practice

## 2012-07-21 ENCOUNTER — Encounter (HOSPITAL_COMMUNITY): Payer: Self-pay | Admitting: Pharmacy Technician

## 2012-07-21 ENCOUNTER — Telehealth: Payer: Self-pay | Admitting: General Practice

## 2012-07-21 NOTE — Telephone Encounter (Signed)
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 She is on lifelong Coumadin.

## 2012-07-21 NOTE — Telephone Encounter (Signed)
Patient is scheduled for a DNC on 5/8 and will take last dose of coumadin on 5/3.  Do you want to bridge with Lovenox?    Thanks, Bailey Mech, RN [facility]

## 2012-07-21 NOTE — Telephone Encounter (Signed)
Spoke with patient to give instructions for coumadin and Lovenox in regards to surgery on 5/8. Cr Cl using adjusted BW is 97.74. 5/3 - last dose of coumadin 5/4 - nothing 5/5 - Lovenox in am and no coumadin 5/6 - Lovenox in am and no coumadin 5/7 - Lovenox in am and no coumadin 5/8 - surgery (no lovenox and no coumadin) 5/9 - Lovenox in am and 7.5 mg coumadin 5/10 - Lovenox in am and 7.5 mg of coumadin 5/11 - Lovenox in am and 5 mg of coumadin 5/12 - Lovenox in am and 5 mg of coumadin 5/13 - Check INR  Patient verbalized understanding and repeated instructions to RN.

## 2012-07-22 ENCOUNTER — Other Ambulatory Visit: Payer: Self-pay | Admitting: General Practice

## 2012-07-22 MED ORDER — ENOXAPARIN SODIUM 120 MG/0.8ML ~~LOC~~ SOLN: 1.5000 mg/kg | Freq: Every day | SUBCUTANEOUS | Status: DC

## 2012-07-27 ENCOUNTER — Encounter (HOSPITAL_COMMUNITY): Payer: Self-pay

## 2012-07-27 ENCOUNTER — Encounter (HOSPITAL_COMMUNITY)
Admission: RE | Admit: 2012-07-27 | Discharge: 2012-07-27 | Disposition: A | Discharge status: Home / Self Care | Payer: Medicare Other | Source: Ambulatory Visit | Attending: Family Medicine | Admitting: Family Medicine

## 2012-07-27 DIAGNOSIS — Z01812 Encounter for preprocedural laboratory examination: Secondary | ICD-10-CM | POA: Insufficient documentation

## 2012-07-27 DIAGNOSIS — Z01818 Encounter for other preprocedural examination: Secondary | ICD-10-CM | POA: Insufficient documentation

## 2012-07-27 HISTORY — DX: Other specified postprocedural states: Z98.890

## 2012-07-27 HISTORY — DX: Other specified postprocedural states: R11.2

## 2012-07-27 LAB — APTT: aPTT: 30 seconds (ref 24–37)

## 2012-07-27 LAB — BASIC METABOLIC PANEL
BUN: 13 mg/dL (ref 6–23)
CO2: 29 mEq/L (ref 19–32)
Calcium: 9.9 mg/dL (ref 8.4–10.5)
Chloride: 102 mEq/L (ref 96–112)
Creatinine, Ser: 0.7 mg/dL (ref 0.50–1.10)
GFR calc Af Amer: >90 mL/min (ref >90)
GFR calc non Af Amer: 86 mL/min — ABNORMAL LOW (ref >90)
Glucose, Bld: 103 mg/dL — ABNORMAL HIGH (ref 70–99)
Potassium: 3.9 mEq/L (ref 3.5–5.1)
Sodium: 140 mEq/L (ref 135–145)

## 2012-07-27 LAB — CBC
HCT: 45 % (ref 36.0–46.0)
Hemoglobin: 15 g/dL (ref 12.0–15.0)
MCH: 29.4 pg (ref 26.0–34.0)
MCHC: 33.3 g/dL (ref 30.0–36.0)
MCV: 88.1 fL (ref 78.0–100.0)
Platelets: 208 10*3/uL (ref 150–400)
RBC: 5.11 MIL/uL (ref 3.87–5.11)
RDW: 13 % (ref 11.5–15.5)
WBC: 6.8 10*3/uL (ref 4.0–10.5)

## 2012-07-27 LAB — PROTIME-INR
INR: 1.9 — ABNORMAL HIGH (ref 0.00–1.49)
Prothrombin Time: 21.1 seconds — ABNORMAL HIGH (ref 11.6–15.2)

## 2012-07-27 NOTE — Patient Instructions (Addendum)
   Your procedure is scheduled on:  Thursday, May 8th  Enter through the Main Entrance of [facility] at: 12 noon Pick up the phone at the desk and dial [phone] and inform us of your arrival.  Please call this number if you have any problems the morning of surgery: [phone]  Remember: Do not eat food after midnight: Wednesday Do not drink clear liquids after: 930 am Thursday Take these medicines the morning of surgery with a SIP OF WATER:   None - patient takes medication at night  Do not wear jewelry, make-up, or FINGER nail polish No metal in your hair or on your body. Do not wear lotions, powders, perfumes. You may wear deodorant.  Please use your CHG wash as directed prior to surgery.  Do not shave anywhere for at least 12 hours prior to first CHG shower.  Do not bring valuables to the hospital. Contacts, dentures or bridgework may not be worn into surgery.  Patients discharged on the day of surgery will not be allowed to drive home.  Home with daughter Amy.

## 2012-07-27 NOTE — Pre-Procedure Instructions (Signed)
Dr Rodman Pickle informed of patient's Pt/PTT results.  Verbal order to repeat CBC, PT/PTT STAT on DOS.  Verified order

## 2012-08-05 ENCOUNTER — Encounter (HOSPITAL_COMMUNITY): Payer: Self-pay | Admitting: Anesthesiology

## 2012-08-05 ENCOUNTER — Ambulatory Visit (HOSPITAL_COMMUNITY)
Admission: RE | Admit: 2012-08-05 | Discharge: 2012-08-05 | Disposition: A | Discharge status: Home / Self Care | Payer: Medicare Other | Source: Ambulatory Visit | Attending: Family Medicine | Admitting: Family Medicine

## 2012-08-05 ENCOUNTER — Ambulatory Visit (HOSPITAL_COMMUNITY): Payer: Medicare Other | Admitting: Anesthesiology

## 2012-08-05 ENCOUNTER — Encounter (HOSPITAL_COMMUNITY)
Admission: RE | Disposition: A | Discharge status: Home / Self Care | Payer: Self-pay | Source: Ambulatory Visit | Attending: Family Medicine

## 2012-08-05 DIAGNOSIS — N8502 Endometrial intraepithelial neoplasia [EIN]: Secondary | ICD-10-CM | POA: Diagnosis present

## 2012-08-05 DIAGNOSIS — N840 Polyp of corpus uteri: Secondary | ICD-10-CM | POA: Insufficient documentation

## 2012-08-05 DIAGNOSIS — N950 Postmenopausal bleeding: Secondary | ICD-10-CM | POA: Insufficient documentation

## 2012-08-05 DIAGNOSIS — Z7901 Long term (current) use of anticoagulants: Secondary | ICD-10-CM | POA: Insufficient documentation

## 2012-08-05 HISTORY — PX: HYSTEROSCOPY W/D&C: SHX1775

## 2012-08-05 LAB — PROTIME-INR
INR: 0.95 (ref 0.00–1.49)
Prothrombin Time: 12.6 seconds (ref 11.6–15.2)

## 2012-08-05 LAB — CBC
HCT: 43.9 % (ref 36.0–46.0)
Hemoglobin: 15 g/dL (ref 12.0–15.0)
MCH: 29.7 pg (ref 26.0–34.0)
MCHC: 34.2 g/dL (ref 30.0–36.0)
MCV: 86.9 fL (ref 78.0–100.0)
Platelets: 175 10*3/uL (ref 150–400)
RBC: 5.05 MIL/uL (ref 3.87–5.11)
RDW: 13 % (ref 11.5–15.5)
WBC: 6.4 10*3/uL (ref 4.0–10.5)

## 2012-08-05 LAB — APTT: aPTT: 26 seconds (ref 24–37)

## 2012-08-05 SURGERY — DILATATION AND CURETTAGE /HYSTEROSCOPY
Anesthesia: General | Site: Vagina | Wound class: Clean Contaminated

## 2012-08-05 MED ORDER — SILVER NITRATE-POT NITRATE 75-25 % EX MISC
CUTANEOUS | Status: DC | PRN
  Administered 2012-08-05: 2

## 2012-08-05 MED ORDER — FENTANYL CITRATE 0.05 MG/ML IJ SOLN
INTRAMUSCULAR | Status: DC | PRN
  Administered 2012-08-05 (×2): 50 ug via INTRAVENOUS

## 2012-08-05 MED ORDER — MIDAZOLAM HCL 2 MG/2ML IJ SOLN
INTRAMUSCULAR | Status: AC
  Filled 2012-08-05: qty 2

## 2012-08-05 MED ORDER — DEXAMETHASONE SODIUM PHOSPHATE 10 MG/ML IJ SOLN
INTRAMUSCULAR | Status: AC
  Filled 2012-08-05: qty 1

## 2012-08-05 MED ORDER — DEXAMETHASONE SODIUM PHOSPHATE 4 MG/ML IJ SOLN
INTRAMUSCULAR | Status: DC | PRN
  Administered 2012-08-05: 10 mg via INTRAVENOUS

## 2012-08-05 MED ORDER — LACTATED RINGERS IV SOLN
INTRAVENOUS | Status: DC
  Administered 2012-08-05 (×2): via INTRAVENOUS

## 2012-08-05 MED ORDER — ACETAMINOPHEN 10 MG/ML IV SOLN: 1000.0000 mg | Freq: Once | INTRAVENOUS | Status: DC | PRN

## 2012-08-05 MED ORDER — FENTANYL CITRATE 0.05 MG/ML IJ SOLN: 25.0000 ug | INTRAMUSCULAR | Status: DC | PRN

## 2012-08-05 MED ORDER — MEPERIDINE HCL 25 MG/ML IJ SOLN: 6.2500 mg | INTRAMUSCULAR | Status: DC | PRN

## 2012-08-05 MED ORDER — LIDOCAINE HCL (CARDIAC) 20 MG/ML IV SOLN
INTRAVENOUS | Status: DC | PRN
  Administered 2012-08-05: 50 mg via INTRAVENOUS

## 2012-08-05 MED ORDER — BUPIVACAINE-EPINEPHRINE PF 0.25-1:200000 % IJ SOLN
INTRAMUSCULAR | Status: AC
  Filled 2012-08-05: qty 30

## 2012-08-05 MED ORDER — PROPOFOL 10 MG/ML IV EMUL
INTRAVENOUS | Status: AC
  Filled 2012-08-05: qty 20

## 2012-08-05 MED ORDER — FENTANYL CITRATE 0.05 MG/ML IJ SOLN
INTRAMUSCULAR | Status: AC
  Filled 2012-08-05: qty 2

## 2012-08-05 MED ORDER — ONDANSETRON HCL 4 MG/2ML IJ SOLN
INTRAMUSCULAR | Status: DC | PRN
  Administered 2012-08-05: 4 mg via INTRAVENOUS

## 2012-08-05 MED ORDER — BUPIVACAINE-EPINEPHRINE 0.25% -1:200000 IJ SOLN
INTRAMUSCULAR | Status: DC | PRN
  Administered 2012-08-05: 10 mL

## 2012-08-05 MED ORDER — LIDOCAINE HCL (CARDIAC) 20 MG/ML IV SOLN
INTRAVENOUS | Status: AC
  Filled 2012-08-05: qty 5

## 2012-08-05 MED ORDER — ONDANSETRON HCL 4 MG/2ML IJ SOLN
INTRAMUSCULAR | Status: AC
  Filled 2012-08-05: qty 2

## 2012-08-05 MED ORDER — PROMETHAZINE HCL 25 MG/ML IJ SOLN: 6.2500 mg | INTRAMUSCULAR | Status: DC | PRN

## 2012-08-05 MED ORDER — PROPOFOL 10 MG/ML IV EMUL
INTRAVENOUS | Status: DC | PRN
  Administered 2012-08-05: 130 mg via INTRAVENOUS

## 2012-08-05 MED ORDER — GLYCINE 1.5 % IR SOLN
Status: DC | PRN
  Administered 2012-08-05: 3000 mL

## 2012-08-05 SURGICAL SUPPLY — 21 items
CANISTER SUCTION 2500CC (MISCELLANEOUS) ×2 IMPLANT
CATH ROBINSON RED A/P 16FR (CATHETERS) ×2 IMPLANT
CLOTH BEACON ORANGE TIMEOUT ST (SAFETY) ×2 IMPLANT
CONTAINER PREFILL 10% NBF 60ML (FORM) ×3 IMPLANT
CORD ACTIVE DISPOSABLE (ELECTRODE)
CORD ELECTRO ACTIVE DISP (ELECTRODE) IMPLANT
DRESSING TELFA 8X3 (GAUZE/BANDAGES/DRESSINGS) ×2 IMPLANT
ELECT LOOP GYNE PRO 24FR (CUTTING LOOP)
ELECT REM PT RETURN 9FT ADLT (ELECTROSURGICAL)
ELECT VAPORTRODE GRVD BAR (ELECTRODE) IMPLANT
ELECTRODE LOOP GYNE PRO 24FR (CUTTING LOOP) IMPLANT
ELECTRODE REM PT RTRN 9FT ADLT (ELECTROSURGICAL) IMPLANT
GLOVE BIOGEL PI IND STRL 7.0 (GLOVE) ×1 IMPLANT
GLOVE BIOGEL PI INDICATOR 7.0 (GLOVE) ×1
GLOVE ECLIPSE 7.0 STRL STRAW (GLOVE) ×5 IMPLANT
GOWN PREVENTION PLUS XLARGE (GOWN DISPOSABLE) ×2 IMPLANT
GOWN STRL REIN XL XLG (GOWN DISPOSABLE) ×4 IMPLANT
PACK HYSTEROSCOPY LF (CUSTOM PROCEDURE TRAY) ×2 IMPLANT
PAD OB MATERNITY 4.3X12.25 (PERSONAL CARE ITEMS) ×2 IMPLANT
TOWEL OR 17X24 6PK STRL BLUE (TOWEL DISPOSABLE) ×4 IMPLANT
WATER STERILE IRR 1000ML POUR (IV SOLUTION) ×2 IMPLANT

## 2012-08-05 NOTE — Op Note (Signed)
Preoperative diagnoses:    Postoperative diagnosis: Same  Procedure: D & C, hysteroscopy  Surgeon: Shelbie Proctor. Shawnie Pons, MD  Anesthesia: Sandrea Hughs., MD  Findings: Flimsy material noted posteriorly, no mass, smooth appearing endometrium otherwise.  Estimated blood loss: Minimum  Pathology: Endometrial curettings  Indications for procedure: 72 y.o. with history of post-menopausal bleeding and who had previous D and C which found a polyp.  She is on chronic blood thinners and  Had repetitive post-menopausal bleeding who presents for Dilation and Curettage with Hysteroscopy.  Procedure: The patient was taken to the operating room where spinal analgesia was inserted. SCDs were in place.  Time out was performed. Patient was placed in dorsolithotomy announce stirrups. She was prepped and draped in the usual sterile fashion. A Red Rubber catheter was used to drain her bladder. A speculum was placed in the vagina. The cervix was visualized anteriorly and grasped with a single-tooth tenaculum. Paracervical block was performed with 1% lidocaine with epinephrine with 10 cc injected. The uterus sounded to 9 cm. Sequential dilation was performed with Shawnie Pons dilators. The hysteroscope was inserted and the endometrial cavity and inspected. There were no findings noted in the endometrial cavity with both ostia seen.  The hysteroscope was removed. Sharp curettage was performed in all 4 quadrants. All instruments were removed from the vagina.  Hysteroscope re-introduced to ensure adequate sampling. All instrument, needle and lap counts were correct x2. The patient was awakened and is recovering in stable condition.  Darris Carachure S MD 08/05/2012 2:16 PM

## 2012-08-05 NOTE — Interval H&P Note (Signed)
History and Physical Interval Note:  08/05/2012 1:26 PM  Bonnie Zhang  has presented today for surgery, with the diagnosis of cpt [id number] - Postmenopausal bleeding  The various methods of treatment have been discussed with the patient and family. After consideration of risks, benefits and other options for treatment, the patient has consented to  Procedure(s): DILATATION AND CURETTAGE /HYSTEROSCOPY (N/A) as a surgical intervention .  The patient's history has been reviewed, patient examined, no change in status, stable for surgery.  I have reviewed the patient's chart and labs.  Questions were answered to the patient's satisfaction.     Grasiela Jonsson S

## 2012-08-05 NOTE — Anesthesia Preprocedure Evaluation (Signed)
Anesthesia Evaluation  Patient identified by MRN, date of birth, ID band Patient awake    Reviewed: Allergy & Precautions, H&P , NPO status , Patient's Chart, lab work & pertinent test results  Airway Mallampati: I TM Distance: >3 FB Neck ROM: full    Dental no notable dental hx. (+) Teeth Intact   Pulmonary neg pulmonary ROS,    Pulmonary exam normal       Cardiovascular negative cardio ROS      Neuro/Psych negative psych ROS   GI/Hepatic Neg liver ROS,   Endo/Other  negative endocrine ROS  Renal/GU negative Renal ROS  negative genitourinary   Musculoskeletal negative musculoskeletal ROS (+)   Abdominal Normal abdominal exam  (+)   Peds negative pediatric ROS (+)  Hematology negative hematology ROS (+)   Anesthesia Other Findings   Reproductive/Obstetrics negative OB ROS                           Anesthesia Physical Anesthesia Plan  ASA: II  Anesthesia Plan: General   Post-op Pain Management:    Induction: Intravenous  Airway Management Planned: LMA  Additional Equipment:   Intra-op Plan:   Post-operative Plan:   Informed Consent: I have reviewed the patients History and Physical, chart, labs and discussed the procedure including the risks, benefits and alternatives for the proposed anesthesia with the patient or authorized representative who has indicated his/her understanding and acceptance.     Plan Discussed with: CRNA and Surgeon  Anesthesia Plan Comments:         Anesthesia Quick Evaluation

## 2012-08-05 NOTE — H&P (Signed)
Bonnie Zhang is an 72 y.o. No obstetric history on file. Unknown female.   Chief Complaint: Postmenopausal  bleeding HPI: H/o long term coumadin.  H/o of prior PMB with D and C that showed large polyp.  No further bleeding until 6 wks ago.  Has now had further bleeding.  Failed office EMB x 3 last episode, so was scheduled for OR.  Past Medical History  Diagnosis Date  . High cholesterol     takes Simvastating daily  . Vitamin D deficiency     takes VIt D daily  . DVT (deep vein thrombosis) in pregnancy      1965 post C section;2001 with prolonged driving while on  HRT  . Osteoporosis   . Hypertension     takes Benazepril,Amlodipine,and Metoprolol daily  . Hx of migraines     yrs ago   . Bruises easily     pt is on Coumadin;last dose of Coumadin 08/05/11 and then Lovenox started 08/07/11  . GERD (gastroesophageal reflux disease)   . Hx of colonic polyps     Dr Jarold Motto  . PONV (postoperative nausea and vomiting)   . PVD (peripheral vascular disease) 1965, 2001    abnormal venous doppler findings due to recurrent DVT'S left leg  . Right knee DJD     knees  . Headache     history - years ago, no prob x 40 yrs per pt    Past Surgical History  Procedure Laterality Date  . Cesarean section  1961/1965/1966    X 3  . Colonoscopy w/ polypectomy  2007    Dr  Jarold Motto; due? 2014  . Breast lumpectomy  1987  . Cholecystectomy  1997  . Dilation and curettage of uterus  2012    uterine polyp, Dr Shawnie Pons  . Appendectomy  1965  . Esophagogastroduodenoscopy  1997  . 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 Wisdom tooth extraction      Family History  Problem Relation Age of Onset  . Kidney disease Mother     ? hypertensive  . Hypertension Mother   . Deep vein thrombosis Mother     post ankle fracture  .  Leukemia Father   . Colon cancer Maternal Grandmother   . Diabetes Other     cousins  . Anesthesia problems Neg Hx   . Hypotension Neg Hx   . Malignant hyperthermia Neg Hx   . Pseudochol deficiency Neg Hx   . Stroke Neg Hx   . Heart disease Neg Hx    Social History:  reports that she has never smoked. She has never used smokeless tobacco. She reports that she does not drink alcohol or use illicit drugs.  Allergies:  Allergies  Allergen Reactions  . Aspirin Other (See Comments)    upset stomach  . Atorvastatin Other (See Comments)    leg pain  . Hctz (Hydrochlorothiazide) Other (See Comments)    FATIGUE AND EXTREMELY LOW POTASSIUM    No prescriptions prior to admission     Pertinent items are noted in HPI.  There were no vitals taken for this visit. There were no vitals taken for this visit. General appearance: alert, cooperative and appears stated age Head: Normocephalic, without obvious abnormality, atraumatic Neck: supple, symmetrical, trachea midline Lungs: clear to auscultation bilaterally Heart: regular  rate and rhythm, S1, S2 normal, no murmur, click, rub or gallop Abdomen: soft, non-tender; bowel sounds normal; no masses,  no organomegaly Extremities: extremities normal, atraumatic, no cyanosis or edema Skin: Skin color, texture, turgor normal. No rashes or lesions Neurologic: Grossly normal   Lab Results  Component Value Date   WBC 6.8 07/27/2012   HGB 15.0 07/27/2012   HCT 45.0 07/27/2012   MCV 88.1 07/27/2012   PLT 208 07/27/2012    US Pelvis Complete  07/13/2012  *RADIOLOGY REPORT*  Clinical Data: Post menopausal bleeding.  Cramping.  TRANSABDOMINAL AND TRANSVAGINAL ULTRASOUND OF PELVIS  Technique:  Both transabdominal and transvaginal ultrasound examinations of the pelvis were performed.  Transabdominal technique was performed for global imaging of the pelvis including uterus, ovaries, adnexal regions, and pelvic cul-de-sac.  It was necessary to proceed  with endovaginal exam following the transabdominal exam to visualize the endometrium and ovaries.  Comparison:  09/16/2010  Findings: Uterus:  8.0 x 2.7 x 3.7 cm.  Heterogeneous echogenicity of the uterine myometrium noted. A 1.4 by 1.2 x 1.0 cm partially calcified uterine fibroid is again seen in the posterior lower uterine segment.  No other discrete fibroids identified.  Endometrium: Double layer thickness measures 9 mm transvaginally. No focal lesion visualized.  Right ovary: not directly visualized by transabdominal or transvaginal sonography, however no adnexal mass identified.  Left ovary: not directly visualized by transabdominal or transvaginal sonography, however no adnexal mass identified.  Other Findings:  No free fluid  IMPRESSION:  1.  Endometrial thickness measures 9 mm. In the setting of post- menopausal bleeding, endometrial sampling is indicated to exclude carcinoma.  If results are benign, sonohysterogram should be considered for focal lesion work-up. (Ref:  Radiological Reasoning: Algorithmic Workup of Abnormal Vaginal Bleeding with Endovaginal Sonography and Sonohysterography. AJR 2008; [id number]:[id number]). 2.  Stable 1.4 cm partially calcified fibroid in the posterior lower uterine segment. 3.  Nonvisualization of the ovaries, however no adnexal mass identified.   Original Report Authenticated By: Myles Rosenthal, M.D.     Assessment/Plan Patient Active Problem List   Diagnosis Date Noted  . Postoperative anemia due to acute blood loss 08/13/2011  . DVT (deep venous thrombosis)   . GERD (gastroesophageal reflux disease)   . PVD (peripheral vascular disease)   . Right knee DJD   . Unspecified adverse effect of unspecified drug, medicinal and biological substance 07/14/2011  . Postmenopausal bleeding 10/09/2010  . Long term current use of anticoagulant 05/01/2010  . OSTEOPOROSIS 03/21/2010  . COLONIC POLYPS, HX OF 03/20/2008  . VITAMIN D DEFICIENCY 08/25/2007  . HYPERLIPIDEMIA 03/18/2007  .  Dysmetabolic syndrome X 03/18/2007  . HYPERTENSION, ESSENTIAL NOS 03/18/2007  . DVT, HX OF 07/23/2006  Endometrial thickening on u/s and unable to do EMB in office. D & C Hysteroscopy. Risks include but are not limited to bleeding, infection, injury to surrounding structures, including bowel, bladder and ureters, blood clots, and death.  Likelihood of success is high.   Anylah Scheib S 08/05/2012, 10:59 AM

## 2012-08-05 NOTE — Anesthesia Postprocedure Evaluation (Signed)
Anesthesia Post Note  Patient: Bonnie Zhang  Procedure(s) Performed: Procedure(s) (LRB): DILATATION AND CURETTAGE /HYSTEROSCOPY (N/A)  Anesthesia type: General  Patient location: PACU  Post pain: Pain level controlled  Post assessment: Post-op Vital signs reviewed  Last Vitals:  Filed Vitals:   08/05/12 1430  BP:   Pulse:   Temp: 37 C  Resp: 16    Post vital signs: Reviewed  Level of consciousness: sedated  Complications: No apparent anesthesia complications

## 2012-08-05 NOTE — Transfer of Care (Signed)
Immediate Anesthesia Transfer of Care Note  Patient: Bonnie Zhang  Procedure(s) Performed: Procedure(s): DILATATION AND CURETTAGE /HYSTEROSCOPY (N/A)  Patient Location: PACU  Anesthesia Type:General  Level of Consciousness: awake and oriented  Airway & Oxygen Therapy: Patient Spontanous Breathing and Patient connected to nasal cannula oxygen  Post-op Assessment: Report given to PACU RN and Post -op Vital signs reviewed and stable  Post vital signs: Reviewed and stable  Complications: No apparent anesthesia complications

## 2012-08-06 ENCOUNTER — Encounter (HOSPITAL_COMMUNITY): Payer: Self-pay | Admitting: Family Medicine

## 2012-08-10 ENCOUNTER — Ambulatory Visit (INDEPENDENT_AMBULATORY_CARE_PROVIDER_SITE_OTHER): Payer: Medicare Other | Admitting: General Practice

## 2012-08-10 DIAGNOSIS — Z7901 Long term (current) use of anticoagulants: Secondary | ICD-10-CM

## 2012-08-10 DIAGNOSIS — Z86718 Personal history of other venous thrombosis and embolism: Secondary | ICD-10-CM

## 2012-08-10 LAB — POCT INR: INR: 1.7

## 2012-08-11 ENCOUNTER — Encounter: Payer: Self-pay | Admitting: Family Medicine

## 2012-08-11 ENCOUNTER — Ambulatory Visit (INDEPENDENT_AMBULATORY_CARE_PROVIDER_SITE_OTHER): Payer: Medicare Other | Admitting: Family Medicine

## 2012-08-11 VITALS — BP 142/76 | HR 96 | Wt 180.0 lb

## 2012-08-11 DIAGNOSIS — Z09 Encounter for follow-up examination after completed treatment for conditions other than malignant neoplasm: Secondary | ICD-10-CM

## 2012-08-11 DIAGNOSIS — N8502 Endometrial intraepithelial neoplasia [EIN]: Secondary | ICD-10-CM

## 2012-08-11 NOTE — Progress Notes (Signed)
Here today for pathology results.

## 2012-08-11 NOTE — Progress Notes (Signed)
  Subjective:    Patient ID: Bonnie Zhang, female    DOB: Aug 03, 1940, 72 y.o.   MRN: [id number]  HPI  Here today for f/u path.  Underwent D & C with Hysteroscopy last week.  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  Results discussed at length with pt.  We also reviewed likely treatments.  Case discussed with Telford Nab.   Review of Systems  Gastrointestinal: Negative for nausea, vomiting and diarrhea.  Genitourinary: Negative for dysuria and vaginal bleeding.       Objective:   Physical Exam  Vitals reviewed. Constitutional: She appears well-developed and well-nourished.  HENT:  Head: Normocephalic and atraumatic.  Eyes: No scleral icterus.  Neck: Neck supple.  Cardiovascular: Normal rate.   Pulmonary/Chest: Effort normal.  Abdominal: Soft.          Assessment & Plan:

## 2012-08-11 NOTE — Assessment & Plan Note (Signed)
Because of pathology, would prefer GYN/ONC proceed with treatment, in case she needs something further.  Discussed at length with pt.  Appointment scheduled with Dr. Laurette Schimke on 08/19/12 at 3:30.  Instructions given to pt.

## 2012-08-11 NOTE — Patient Instructions (Signed)
Cancer of the Uterus The uterus is part of a woman's reproductive system. It is the hollow, pear-shaped organ where a baby grows. The uterus is in the pelvis between the bladder and the rectum. The narrow, lower portion of the uterus is the cervix. The fallopian tubes extend from either side of the top of the uterus to the ovaries. The wall of the uterus has two layers of tissue. The inner layer, or lining, is the endometrium. The outer layer is muscle tissue called the myometrium. In women of childbearing age, the lining of the uterus grows and thickens each month to prepare for pregnancy. If a woman does not become pregnant, the thick, bloody lining flows out of the body through the vagina. This flow is called menstruation. TYPES OF UTERINE CANCER  The most common type of cancer of the uterus begins in the lining (endometrium). It is called endometrial cancer, uterine cancer, or cancer of the uterus. It is seen in 2% to 3% of women.  A different type of cancer, uterine sarcoma, develops in the muscle (myometrium). Cancer that begins in the cervix is also a different type of cancer.  Rarely, a noncancerous fibroid tumor of the uterus develops into a sarcoma. CAUSES  No one knows the exact causes of uterine cancer. But it is clear that this disease is not contagious. No one can "catch" cancer from another person. Women who get this disease are more likely than other women to have certain risk factors. A risk factor is something that increases a person's chance of developing the disease.  Most women who have known risk factors do not get uterine cancer. On the other hand, many who do get this disease have none of these factors. Doctors can seldom explain why one woman gets uterine cancer and another does not.  Studies have found the following risk factors:  Age. Cancer of the uterus occurs mostly in women over age 50.  Endometrial hyperplasia (enlarged endometrium). The risk of uterine cancer is  higher if a woman has endometrial hyperplasia.  Hormone replacement therapy (HRT). HRT is used to control the symptoms of menopause, to prevent osteoporosis (thinning of the bones), and to reduce the risk of heart disease or stroke. Women who still have their uterus, and use estrogen without progesterone, have an increased risk of uterine cancer. Long-term use and large doses of estrogen seem to increase this risk. Women who use a combination of estrogen and progesterone have a lower risk of uterine cancer than women who use estrogen alone. The progesterone protects the uterus from developing cancer.  Obesity and related conditions. The body stores and releases some of its estrogen in fatty tissue. That is why obese women are more likely than thin women to have higher levels of estrogen in their bodies. High levels of estrogen may be the reason that obese women have an increased risk of developing uterine cancer. The risk of this disease is also higher in women with diabetes or high blood pressure. These conditions occur in many obese women.  Tamoxifen. Women taking the drug tamoxifen to prevent or treat breast cancer have an increased risk of uterine cancer. This risk appears to be related to the estrogen-like effect of this drug on the uterus.  Race. White women are more likely than African-American women to get uterine cancer.  Colorectal cancer. Women who have had an inherited form of colorectal cancer have a higher risk of developing uterine cancer than other women.  Infertility.  Beginning menstrual   periods before age 12.  Having menstrual periods after age 52.  History of cancer of the ovary or intestine.  Family history of uterine cancer.  Having diabetes, high blood pressure, thyroid or gallbladder disease.  Long-term use of high does of birth control pills. Birth control pills today are low in hormone doses.  Radiation to the abdomen or pelvis.  Smoking. SYMPTOMS  Uterine  cancer usually occurs after menopause. But it may also occur around the time that menopause begins. Abnormal vaginal bleeding is the most common symptom of uterine cancer. Bleeding may start as a watery, blood-streaked flow that gradually contains more blood. Women should not assume that abnormal vaginal bleeding is part of menopause. A woman should see her caregiver if she has any of the following symptoms:  Unusual vaginal bleeding or discharge.  Difficult or painful urination.  Pain during intercourse.  Pain in the pelvic area.  Increased girth (growth) of the stomach.  Any vaginal bleeding after menopause.  Unexplained weight loss. These symptoms can be caused by cancer or other less serious conditions. Most often they are not cancer. But a thorough evaluation is needed to be certain. DIAGNOSIS  If a woman has symptoms that suggest uterine cancer, her caregiver may check her general health and may order blood and urine tests. The caregiver also may perform one or more of these exams or tests.  Blood and urine tests and chest x-rays. The woman also may have:  Other X-rays.  CT scans.  Ultrasound test.  Magnetic resonance imaging (MRI).  Sigmoidoscopy.  Colonoscopy.  Pelvic exam. A woman will have a pelvic exam to check the vagina, uterus, bladder, and rectum. The caregiver feels these organs for any lumps or changes in their shape or size. To see the upper part of the vagina and the cervix, the caregiver inserts an instrument called a speculum into the vagina.  Pap test. The caregiver collects cells from the cervix and upper vagina. A medical laboratory checks for abnormal cells. The Pap test is better for detecting cancer of the cervix. But cells from inside the uterus usually do not show up on a Pap test. It is not a reliable test for uterine cancer.  Transvaginal ultrasound. The medical caregiver inserts an instrument into the vagina. The instrument aims high-frequency  sound waves at the uterus. The pattern of the echoes they produce creates a picture. If the endometrium looks too thick, the caregiver can do a biopsy.  Biopsy. The medical caregiver removes a sample of tissue from the uterine lining. This usually can be done in the caregiver's office.  Dilatation and Curettage (D&C). In some cases, a woman may need to have a D&C. D&C is usually done as same-day surgery with anesthesia in a hospital. A pathologist examines the tissue (lining of the uterus) to check for cancer cells and other conditions. STAGING   If uterine cancer is diagnosed, the caregiver needs to know the stage, or extent, of the disease to plan the best treatment. Staging is a careful attempt to find out whether the cancer has spread, and if so, to what parts of the body.  When uterine cancer spreads (metastasizes) outside the uterus, cancer cells are often found in nearby lymph nodes, nerves, or blood vessels. If the cancer has reached the lymph nodes, cancer cells may have spread to other lymph nodes and other organs of the body.  Staging is done at the time of surgery. In most cases, the most reliable way to stage   this disease is to remove the uterus, cervix, tubes, ovaries, and lymph nodes. A pathologist uses a microscope to examine the uterus and other tissues removed by the surgeon, to determine the extent of the cancer in the pelvis.  If lymph nodes have cancer cells, other parts of the body are examined, to see if it has spread to other organs. MAIN FEATURES OF EACH STAGE OF THE DISEASE: Stage I. The cancer is only in the body of the uterus. It is not in the cervix. Stage II. The cancer has spread from the body of the uterus to the cervix. Stage III. The cancer has spread outside the uterus, but not outside the pelvis (and not to the bladder or rectum). Lymph nodes in the pelvis may contain cancer cells. Stage IV. The cancer has spread into the bladder or rectum. It may have spread  beyond the pelvis to other body parts. TREATMENT  Women with uterine cancer have many treatment options. Most women with uterine cancer are treated with surgery. Some have radiation or chemotherapy. A smaller number of women may be treated with hormonal therapy. Some patients receive a combination of therapies. You may want to consult with another cancer doctor for a second opinion. The caregiver (usually a cancer doctor) is the best person to describe your treatment choices and to discuss the expected results of treatment. SURGERY  Most women with uterine cancer have surgery to remove the uterus, cervix, tubes, and ovaries (total hysterectomy). This is usually done through an incision in the abdomen.  The doctor may also remove the lymph nodes near the tumor, to see if they contain cancer. If cancer cells have reached the lymph nodes, it may mean that the disease has spread to other parts of the body. If cancer cells have not spread beyond the endometrium, the woman may not need to have any other treatment. The length of the hospital stay may vary from several days to a week. RADIATION THERAPY  In radiation therapy, high-energy rays are used to kill cancer cells. Like surgery, radiation therapy is a local therapy. It affects cancer cells only in the treated area.  Some women with Stage I, II, or III uterine cancer need both radiation therapy and surgery. They may have radiation before surgery to shrink the tumor, or after surgery to destroy any cancer cells that remain in the area. The doctor may suggest radiation treatments for the small number of women who cannot have surgery.  Doctors use two types of radiation therapy to treat uterine cancer:  External radiation. In external radiation therapy, a large machine outside the body is used to aim radiation at the tumor area. The woman usually does not stay overnight (outpatient) at the hospital or clinic, and receives external radiation 5 days a week  for several weeks. This schedule helps protect healthy cells and tissue by spreading out the total dose of radiation. No radioactive materials are put into the body for external radiation therapy.  Internal radiation. In internal radiation therapy, tiny tubes containing a radioactive substance are inserted through the vagina and cervix, into the uterus, and left in place for a few days. The woman stays in the hospital during this treatment. To protect others from radiation exposure, the patient may not be able to have visitors or may have visitors only for a short period of time while the implant is in place. Once the implant is removed, the woman has no radioactivity in her body.  Some patients need both   external and internal radiation therapies. CHEMOTHERAPY Chemotherapy is not usually used for endometrial cancer of the uterus. However, with sarcoma of the uterus or of the fibroid, it may be used in combination with surgery. Chemotherapy may also be used with recurring sarcoma, and in patients who cannot have surgery. HORMONE THERAPY Hormonal therapy involves substances that prevent cancer cells from multiplying or growing by attaching to hormone receptors. This causes changes in cancer cells. Before therapy begins, the caregiver may request a hormone receptor test. This special lab test of uterine tissue helps the caregiver learn if estrogen and progesterone receptors are present. If the tissue has receptors, the woman is more likely to respond to hormonal therapy.  Hormonal therapy is called a systemic therapy, because it can affect cancer cells throughout the body. Usually, hormonal therapy is a type of progesterone, taken as a pill or injection.  The doctor may use hormonal therapy for women with uterine cancer who are unable to have surgery or radiation therapy. Also, the doctor may give hormonal therapy to women with uterine cancer that has spread to the lungs or other distant sites. It is also  given to women with uterine cancer that has come back.  Hormonal therapy can cause a number of side effects. Women taking progesterone may retain fluid, have an increased appetite, and gain weight. Women who are still menstruating may have changes in their periods.  Hormone therapy can be used in combination with surgery or radiation. HOME CARE INSTRUCTIONS   Maintain a normal weight with a healthy balanced diet and exercise.  If you have diabetes, high blood pressure, thyroid or gallbladder disease, keep them in control with your caregiver's treatment and recommendations.  Do not smoke.  Do not take estrogen without taking progesterone with it, for menopausal symptoms.  Join a support group or get counseling, if you would like help dealing with your cancer.  If you are on hormone replacement therapy, see your caregiver as recommended, and be informed about the side effects of HRT.  Women with known risk factors should ask their caregiver what symptoms to look for and how often they should have an examination.  Keep your follow-up appointments and take your medicines as advised.  Write your questions down, and take them with you to your caregiver's appointments.  You may want another person to be with you for your appointments, so you do not miss any instructions. SEEK MEDICAL CARE IF:   You have any abnormal vaginal bleeding.  You are having menstrual periods at the age of 52 or older.  You have bleeding after sexual intercourse.  You are taking tomoxifen and develop vaginal bleeding.  Your stomach is growing, and you are not pregnant.  You have pain with sexual intercourse.  You have stomach or pelvis pain.  You have weight loss for no known reason.  You have pain or difficulty with urination. [facility]  Cancer Information Service (CIS) provides accurate, up-to-date information on cancer to patients and their families, health professionals, and  the general public:  Phone: 1-800-4-CANCER (1-800-422-6237).  Internet: http://www.cancer.gov NCI's website contains complete information about cancer causes and prevention, screening and diagnosis, treatment and survivorship, clinical trials, statistics, funding, training, and employment opportunities, and the Institute and its programs. CLINICAL TRIALS A woman who is interested in being part of a clinical trial should talk with her caregiver. NCI's website (http://www.cancer.gov) provides general information about clinical trials. It also offers detailed information about specific ongoing studies of uterine   cancer by linking to PDQ, a cancer information database developed by the NCI. The Cancer Information Service at 1-800-4-CANCER can answer questions about cancer and provide information from the PDQ database. Document Released: 03/17/2005 Document Revised: 06/09/2011 Document Reviewed: 01/18/2009 [employer] Patient Information 2013 [employer].  

## 2012-08-17 ENCOUNTER — Telehealth: Payer: Self-pay | Admitting: Gynecologic Oncology

## 2012-08-17 NOTE — Telephone Encounter (Signed)
Consult Note: Gyn-Onc  Consult was requested by Dr. Shawnie Pons  for the evaluation of Bonnie Zhang 72 y.o. female  CC:  Complex atypical hyperplasia  Assessment/Plan:  Bonnie Zhang  is a 71 y.o.  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  On Coumadin for chromic DVT.  WIll require perioperative conversion to lovenox.  I extensively reviewed the risks of robotic hysterectomy and possible lymph node dissection of infection, bleeding, damage to nearby organs including bowel, bladder, vessels, nerves, and ureters. We discussed postoperative risks including infection, PE/ DVT, and lymphedema. We reviewed possible need for conversion to open laparotomy, need for possible blood transfusion. I discussed positioning during surgery of trendelenberg and risks of minor facial swelling and care we take in preoperative positioning. We also reviewed possible need for further treatment such as radiation or chemotherapy based on final pathology. Expected postoperative recovery was also discussed.   F/U  with PCP for plan for perioperative anticoagulation strategy   HPI:    Endometrial bx 2012 c/w benign endometrium and polyps.  Pelvic UTZ 06/2012 Uterus: 8.0 x 2.7 x 3.7 cm. Heterogeneous echogenicity of the uterine myometrium noted. A 1.4 by 1.2 x 1.0 cm partially calcified uterine fibroid is again seen in the posterior lower uterine segment. No other discrete fibroids identified. Endometrium: Double layer thickness measures 9 mm transvaginally. No focal lesion visualized. Right ovary: not directly visualized by transabdominal or transvaginal sonography, however no adnexal mass identified. Left ovary: not directly visualized by transabdominal or transvaginal sonography, however no adnexal mass identified.  No free fluid    08/05/2012 D&C Hysteroscopy - AT LEAST ATYPICAL COMPLEX ENDOMETRIAL HYPERPLASIA, PLEASE SEE COMMENT. Microscopic Comment Sections show atypical  complex endometrial hyperplasia with a focal area worrisome for well differentiated endometrioid carcinoma.   Interval History: **  Current Meds:  Outpatient Encounter Prescriptions as of 08/17/2012  Medication Sig Dispense Refill  . amLODipine (NORVASC) 5 MG tablet TAKE ONE TABLET BY MOUTH EVERY DAY  90 tablet  1  . benazepril (LOTENSIN) 10 MG tablet       . Cholecalciferol (VITAMIN D) 2000 UNITS CAPS Take 2,000 Units by mouth daily.        . metoprolol tartrate (LOPRESSOR) 25 MG tablet Take 25 mg by mouth daily.      . pantoprazole (PROTONIX) 40 MG tablet Take 40 mg by mouth daily as needed (heartburn/reflux).      . simvastatin (ZOCOR) 10 MG tablet Take 10 mg by mouth at bedtime.      Marland Kitchen warfarin (COUMADIN) 5 MG tablet Take 2.5-5 mg by mouth daily. Current regimen: 5mg -Monday, Tuesday, Thursday, Friday & Sunday                             2.5mg -Wednesday & Saturday       No facility-administered encounter medications on file as of 08/17/2012.    Allergy:  Allergies  Allergen Reactions  . Aspirin Other (See Comments)    upset stomach  . Atorvastatin Other (See Comments)    leg pain  . Hctz (Hydrochlorothiazide) Other (See Comments)    FATIGUE AND EXTREMELY LOW POTASSIUM    Social Hx:   History   Social History  . Marital Status: Divorced    Spouse Name: N/A    Number of Children: N/A  . Years of Education: N/A   Occupational History  . Not on file.   Social History  Main Topics  . Smoking status: Never Smoker   . Smokeless tobacco: Never Used  . Alcohol Use: No  . Drug Use: No  . Sexually Active: No   Other Topics Concern  . Not on file   Social History Narrative  . No narrative on file    Past Surgical Hx:  Past Surgical History  Procedure Laterality Date  . Cesarean section  1961/1965/1966    X 3  . Colonoscopy w/ polypectomy  2007    Dr  Jarold Motto; due? 2014  . Breast lumpectomy  1987  . Cholecystectomy  1997  . Dilation and curettage of uterus  2012     uterine polyp, Dr Shawnie Pons  . Appendectomy  1965  . Esophagogastroduodenoscopy  1997  . Total knee arthroplasty  08/11/2011    Procedure: TOTAL KNEE ARTHROPLASTY;  Surgeon: Nilda Simmer, MD;  Location: [facility] OR;  Service: Orthopedics;  Laterality: Right;  DR Thurston Hole WANTS 90 MINUTES FOR THIS CASE  . Wisdom tooth extraction    . Hysteroscopy w/d&c N/A 08/05/2012    Procedure: DILATATION AND CURETTAGE /HYSTEROSCOPY;  Surgeon: Reva Bores, MD;  Location: [facility] ORS;  Service: Gynecology;  Laterality: N/A;    Past Medical Hx:  Past Medical History  Diagnosis Date  . High cholesterol     takes Simvastating daily  . Vitamin D deficiency     takes VIt D daily  . DVT (deep vein thrombosis) in pregnancy      1965 post C section;2001 with prolonged driving while on  HRT  . Osteoporosis   . Hypertension     takes Benazepril,Amlodipine,and Metoprolol daily  . Hx of migraines     yrs ago   . Bruises easily     pt is on Coumadin;last dose of Coumadin 08/05/11 and then Lovenox started 08/07/11  . GERD (gastroesophageal reflux disease)   . Hx of colonic polyps     Dr Jarold Motto  . PONV (postoperative nausea and vomiting)   . PVD (peripheral vascular disease) 1965, 2001    abnormal venous doppler findings due to recurrent DVT'S left leg  . Right knee DJD     knees  . Headache     history - years ago, no prob x 40 yrs per pt    Past Gynecological History:  *  Family Hx:  Family History  Problem Relation Age of Onset  . Kidney disease Mother     ? hypertensive  . Hypertension Mother   . Deep vein thrombosis Mother     post ankle fracture  . Leukemia Father   . Colon cancer Maternal Grandmother   . Diabetes Other     cousins  . Anesthesia problems Neg Hx   . Hypotension Neg Hx   . Malignant hyperthermia Neg Hx   . Pseudochol deficiency Neg Hx   . Stroke Neg Hx   . Heart disease Neg Hx     Review of Systems:  Constitutional  Feels well,  * Cardiovascular  No chest pain, shortness of  breath, or edema  Pulmonary  No cough or wheeze.  Gastro Intestinal  No nausea, vomitting, or diarrhoea. No bright red blood per rectum, no abdominal pain, change in bowel movement, or constipation.  Genito Urinary  No frequency, urgency, dysuria, ** Musculo Skeletal  No myalgia, arthralgia, joint swelling or pain  Neurologic  No weakness, numbness, change in gait,  Psychology  No depression, anxiety, insomnia.   Vitals:  @V @ Wt Readings from Last 3  Encounters:  08/11/12 180 lb (81.647 kg)  07/27/12 180 lb (81.647 kg)  07/20/12 180 lb (81.647 kg)    Physical Exam: WD in NAD Neck  Supple NROM, without any enlargements.  Lymph Node Survey No cervical supraclavicular or inguinal adenopathy Cardiovascular  Pulse normal rate, regularity and rhythm. S1 and S2 normal.  Lungs  Clear to auscultation bilateraly, without wheezes/crackles/rhonchi. Good air movement.  Skin  No rash/lesions/breakdown  Psychiatry  Alert and oriented to person, place, and time  Abdomen  Normoactive bowel sounds, abdomen soft, non-tender and obese. Surgical  sites intact without evidence of hernia.  Back No CVA tenderness Genito Urinary  Vulva/vagina: Normal external female genitalia.  No lesions. No discharge or bleeding.  Bladder/urethra:  No lesions or masses  Vagina: **  Cervix: Normal appearing, no lesions.  Uterus: Small, mobile, no parametrial involvement or nodularity.  Adnexa: No palpable masses. Rectal  Good tone, no masses no cul de sac nodularity.  Extremities  No bilateral cyanosis, clubbing or edema.   Laurette Schimke, MD, PhD 08/17/2012, 9:28 PM

## 2012-08-18 ENCOUNTER — Encounter: Payer: Self-pay | Admitting: *Deleted

## 2012-08-19 ENCOUNTER — Encounter: Payer: Medicare Other | Admitting: Family Medicine

## 2012-08-19 ENCOUNTER — Ambulatory Visit: Payer: Medicare Other | Attending: Gynecologic Oncology | Admitting: Gynecologic Oncology

## 2012-08-19 ENCOUNTER — Encounter: Payer: Self-pay | Admitting: Gynecologic Oncology

## 2012-08-19 VITALS — BP 140/82 | HR 86 | Temp 99.0°F | Resp 16 | Ht 63.25 in | Wt 178.5 lb

## 2012-08-19 DIAGNOSIS — M810 Age-related osteoporosis without current pathological fracture: Secondary | ICD-10-CM | POA: Insufficient documentation

## 2012-08-19 DIAGNOSIS — D259 Leiomyoma of uterus, unspecified: Secondary | ICD-10-CM | POA: Insufficient documentation

## 2012-08-19 DIAGNOSIS — Z7901 Long term (current) use of anticoagulants: Secondary | ICD-10-CM | POA: Insufficient documentation

## 2012-08-19 DIAGNOSIS — I10 Essential (primary) hypertension: Secondary | ICD-10-CM | POA: Insufficient documentation

## 2012-08-19 DIAGNOSIS — I739 Peripheral vascular disease, unspecified: Secondary | ICD-10-CM | POA: Insufficient documentation

## 2012-08-19 DIAGNOSIS — I82509 Chronic embolism and thrombosis of unspecified deep veins of unspecified lower extremity: Secondary | ICD-10-CM | POA: Insufficient documentation

## 2012-08-19 DIAGNOSIS — N8502 Endometrial intraepithelial neoplasia [EIN]: Secondary | ICD-10-CM | POA: Insufficient documentation

## 2012-08-19 DIAGNOSIS — C541 Malignant neoplasm of endometrium: Secondary | ICD-10-CM

## 2012-08-19 DIAGNOSIS — N950 Postmenopausal bleeding: Secondary | ICD-10-CM | POA: Insufficient documentation

## 2012-08-19 NOTE — Progress Notes (Signed)
Consult Note: Gyn-Onc  Consult was requested by Dr. Shawnie Pons  for the evaluation of Bonnie Zhang 72 y.o. 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  is a 72 y.o.  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   I extensively reviewed the risks of robotic hysterectomy and possible lymph node dissection of infection, bleeding, damage to nearby organs including bowel, bladder, vessels, nerves, and ureters. We discussed postoperative risks including infection, PE/ DVT, and lymphedema. We reviewed possible need for conversion to open laparotomy, need for possible blood transfusion. I discussed positioning during surgery of trendelenberg and risks of minor facial swelling and care we take in preoperative positioning. We also reviewed possible need for further treatment such as radiation or chemotherapy based on final pathology. Expected postoperative recovery was also discussed.  On Coumadin for chromic DVT.  WIll require perioperative conversion to lovenox. F/U  with PCP for plan for perioperative anticoagulation strategy   HPI:  72 year-old gravida 3 para 3 last which appeared in her 97s.   She reports history of vaginal bleeding approximately 2 years ago. Endometrial evaluation  2012 c/w benign endometrium and polyps. Patient noted vaginal bleeding since April of 2014. Pelvic UTZ 06/2012 Uterus: 8.0 x 2.7 x 3.7 cm. Heterogeneous echogenicity of the uterine myometrium noted. A 1.4 by 1.2 x 1.0 cm partially calcified uterine fibroid is again seen in the posterior lower uterine segment. No other discrete fibroids identified. Endometrium: Double layer thickness measures 9 mm transvaginally. No focal lesion visualized. Right ovary: not directly visualized by transabdominal or transvaginal sonography, however no adnexal mass identified. Left ovary: not directly visualized by transabdominal or  transvaginal sonography, however no adnexal mass identified.  No free fluid    08/05/2012 D&C Hysteroscopy - AT LEAST ATYPICAL COMPLEX ENDOMETRIAL HYPERPLASIA, PLEASE SEE COMMENT. Microscopic Comment Sections show atypical complex endometrial hyperplasia with a focal area worrisome for well differentiated endometrioid carcinoma.   Current Meds:  Outpatient Encounter Prescriptions as of 08/19/2012  Medication Sig Dispense Refill  . amLODipine (NORVASC) 5 MG tablet TAKE ONE TABLET BY MOUTH EVERY DAY  90 tablet  1  . benazepril (LOTENSIN) 10 MG tablet       . Cholecalciferol (VITAMIN D) 2000 UNITS CAPS Take 2,000 Units by mouth daily.        . metoprolol tartrate (LOPRESSOR) 25 MG tablet Take 25 mg by mouth daily.      . pantoprazole (PROTONIX) 40 MG tablet Take 40 mg by mouth daily as needed (heartburn/reflux).      . simvastatin (ZOCOR) 10 MG tablet Take 10 mg by mouth at bedtime.      Marland Kitchen warfarin (COUMADIN) 5 MG tablet Take 2.5-5 mg by mouth daily. Current regimen: 5mg -Monday, Tuesday, Thursday, Friday & Sunday                             2.5mg -Wednesday & Saturday       No facility-administered encounter medications on file as of 08/19/2012.    Allergy:  Allergies  Allergen Reactions  . Aspirin Other (See Comments)    upset stomach  . Atorvastatin Other (See Comments)    leg pain  . Hctz (Hydrochlorothiazide) Other (See Comments)    FATIGUE AND EXTREMELY LOW POTASSIUM    Social Hx:   History   Social History  . Marital Status: Divorced  Spouse Name: N/A    Number of Children: N/A  . Years of Education: N/A   Occupational History  . Not on file.   Social History Main Topics  . Smoking status: Never Smoker   . Smokeless tobacco: Never Used  . Alcohol Use: No  . Drug Use: No  . Sexually Active: No   Other Topics Concern  . Not on file   Social History Narrative  . No narrative on file    Past Surgical Hx:  Past Surgical History  Procedure Laterality Date  .  Cesarean section  1961/1965/1966    X 3  . Colonoscopy w/ polypectomy  2007    Dr  Jarold Motto; due? 2014  . Breast lumpectomy  1987  . Cholecystectomy  1997  . Dilation and curettage of uterus  2012    uterine polyp, Dr Shawnie Pons  . Appendectomy  1965  . Esophagogastroduodenoscopy  1997  . Total knee arthroplasty  08/11/2011    Procedure: TOTAL KNEE ARTHROPLASTY;  Surgeon: Nilda Simmer, MD;  Location: [facility] OR;  Service: Orthopedics;  Laterality: Right;  DR Thurston Hole WANTS 90 MINUTES FOR THIS CASE  . Wisdom tooth extraction    . Hysteroscopy w/d&c N/A 08/05/2012    Procedure: DILATATION AND CURETTAGE /HYSTEROSCOPY;  Surgeon: Reva Bores, MD;  Location: [facility] ORS;  Service: Gynecology;  Laterality: N/A;    Past Medical Hx:  Past Medical History  Diagnosis Date  . High cholesterol     takes Simvastating daily  . Vitamin D deficiency     takes VIt D daily  . DVT (deep vein thrombosis) in pregnancy      1965 post C section;2001 with prolonged driving while on  HRT  . Osteoporosis   . Hypertension     takes Benazepril,Amlodipine,and Metoprolol daily  . Hx of migraines     yrs ago   . Bruises easily     pt is on Coumadin;last dose of Coumadin 08/05/11 and then Lovenox started 08/07/11  . GERD (gastroesophageal reflux disease)   . Hx of colonic polyps     Dr Jarold Motto  . PONV (postoperative nausea and vomiting)   . PVD (peripheral vascular disease) 1965, 2001    abnormal venous doppler findings due to recurrent DVT'S left leg  . Right knee DJD     knees  . Headache     history - years ago, no prob x 40 yrs per pt    Past Gynecological History:  Gravida 3 para 3 cesarean section x3 in acute crit at age of 72 with regular menses until her 71s. Denies history of abnormal Pap test.  D&C 2 years ago for vaginal bleeding was remarkable for the presence of polyps and no neoplasia  Family Hx:  Family History  Problem Relation Age of Onset  . Kidney disease Mother     ? hypertensive  .  Hypertension Mother   . Deep vein thrombosis Mother     post ankle fracture  . Leukemia Father   . Colon cancer Maternal Grandmother   . Diabetes Other     cousins  . Anesthesia problems Neg Hx   . Hypotension Neg Hx   . Malignant hyperthermia Neg Hx   . Pseudochol deficiency Neg Hx   . Stroke Neg Hx   . Heart disease Neg Hx     Review of Systems:  Constitutional  Feels well,  denies weight loss Cardiovascular  No chest pain, shortness of breath, or edema  Pulmonary  No cough or wheeze.  Gastro Intestinal  No nausea, vomitting, or diarrhoea. No bright red blood per rectum, no abdominal pain, change in bowel movement, or constipation. Denies any change in appetite early satiety, or abdominal bloating Genito Urinary  No frequency, urgency, dysuria, denies vaginal bleeding Musculo Skeletal  No myalgia, arthralgia, joint swelling or pain  Neurologic  No weakness, numbness, change in gait,  Psychology  No depression, anxiety, insomnia.   Vitals:  BP 140/82  Pulse 86  Temp(Src) 99 F (37.2 C)  Resp 16  Ht 5' 3.25" (1.607 m)  Wt 178 lb 8 oz (80.967 kg)  BMI 31.35 kg/m2   Wt Readings from Last 3 Encounters:  08/19/12 178 lb 8 oz (80.967 kg)  08/11/12 180 lb (81.647 kg)  07/27/12 180 lb (81.647 kg)    Physical Exam: WD in NAD Neck  Supple NROM, without any enlargements.  Lymph Node Survey No cervical supraclavicular or inguinal adenopathy Cardiovascular  Pulse normal rate, regularity and rhythm.   Lungs  Clear to auscultation bilateraly,  Good air movement.  Skin  No rash/lesions/breakdown  Psychiatry  Alert and oriented to person, place, and time  Abdomen  Normoactive bowel sounds, abdomen soft, non-tender and obese. Surgical midline incision is intact without evidence of hernia.  Back No CVA tenderness Genito Urinary  Vulva/vagina: Normal external female genitalia.  No lesions. No discharge or bleeding.  Bladder/urethra:  No lesions or masses  Vagina:  Atrophic no vaginal bleeding  Cervix: Normal appearing proximal to the 1 cm, no lesions.  Uterus: Able to assess size no parametrial involvement or nodularity.  Adnexa: No palpable masses. Rectal  Good tone, no masses no cul de sac nodularity.  Extremities  No bilateral cyanosis, clubbing or edema.   Laurette Schimke, MD, PhD 08/19/2012, 3:53 PM

## 2012-08-19 NOTE — Patient Instructions (Signed)
F/U in Coumadin clinic for instructions regarding anticoagulation. Robotic hysterectomy removal of ovaries tubes and lymph nodes will be scheduled.   Thank you very much Ms. Uriel Horkey Ungerer for allowing me to provide care for you today.  I appreciate your confidence in choosing our Gynecologic Oncology team.  If you have any questions about your visit today please call our office and we will get back to you as soon as possible.  Maryclare Labrador. Geniyah Eischeid MD., PhD Gynecologic Oncology

## 2012-08-24 ENCOUNTER — Ambulatory Visit (INDEPENDENT_AMBULATORY_CARE_PROVIDER_SITE_OTHER): Payer: Medicare Other | Admitting: General Practice

## 2012-08-24 DIAGNOSIS — Z7901 Long term (current) use of anticoagulants: Secondary | ICD-10-CM

## 2012-08-24 DIAGNOSIS — Z86718 Personal history of other venous thrombosis and embolism: Secondary | ICD-10-CM

## 2012-08-24 LAB — POCT INR: INR: 2

## 2012-08-24 NOTE — Patient Instructions (Addendum)
6-12  Take last dose of coumadin until after surgery. 6-13 Do not take coumadin and do not take Lovenox 6-14 - Take Lovenox in am 6-15 - Take Lovenox in am 6-16 - Take Lovenox in am 6-17 - Surgery (Do not take Lovenox or coumadin) 6-18 - Take 7.5 mg coumadin and Lovenox in AM 6-19 - Take 7.5 mg coumadin and Lovenox in AM 6-20 - Take 5 mg coumadin and Lovenox in AM 6-21 - Take 5 mg coumadin and Lovenox in AM 6-22 - Take 5 mg coumadin and Lovenox in AM 6-23 - Take 5 mg coumadin and Lovenox in AM 6-24 - Re-check in coumadin clinic.

## 2012-08-31 ENCOUNTER — Other Ambulatory Visit: Payer: Self-pay | Admitting: Internal Medicine

## 2012-09-07 ENCOUNTER — Encounter (HOSPITAL_COMMUNITY): Payer: Self-pay | Admitting: Pharmacy Technician

## 2012-09-09 NOTE — Patient Instructions (Signed)
Mykela Mewborn Hoots  09/09/2012   Your procedure is scheduled on:  09/14/12   Report to [facility] at    0515 AM.  Call this number if you have problems the morning of surgery: [phone]   Remember:             Clear liquids diet 24 hours prior to surgery.    Do not eat food or drink liquids after midnight.   Take these medicines the morning of surgery with A SIP OF WATER:    Do not wear jewelry, make-up or nail polish.  Do not wear lotions, powders, or perfumes.   Do not shave 48 hours prior to surgery.   Do not bring valuables to the hospital.  Contacts, dentures or bridgework may not be worn into surgery.  Leave suitcase in the car. After surgery it may be brought to your room.  For patients admitted to the hospital, checkout time is 11:00 AM the day of  discharge.     SEE CHG INSTRUCTION SHEET    Please read over the following fact sheets that you were given: MRSA Information, coughing and deep breathing exercises, leg exercises, Blood Transfusion Fact sheet , Incentive Spirometry Fact Sheet                Failure to comply with these instructions may result in cancellation of your surgery.                Patient Signature ____________________________              Nurse Signature _____________________________

## 2012-09-10 ENCOUNTER — Encounter (HOSPITAL_COMMUNITY): Payer: Self-pay

## 2012-09-10 ENCOUNTER — Ambulatory Visit (HOSPITAL_COMMUNITY)
Admission: RE | Admit: 2012-09-10 | Discharge: 2012-09-10 | Disposition: A | Discharge status: Home / Self Care | Payer: Medicare Other | Source: Ambulatory Visit | Attending: Gynecologic Oncology | Admitting: Gynecologic Oncology

## 2012-09-10 ENCOUNTER — Encounter (HOSPITAL_COMMUNITY)
Admission: RE | Admit: 2012-09-10 | Discharge: 2012-09-10 | Disposition: A | Discharge status: Home / Self Care | Payer: Medicare Other | Source: Ambulatory Visit | Attending: Gynecologic Oncology | Admitting: Gynecologic Oncology

## 2012-09-10 DIAGNOSIS — Z01818 Encounter for other preprocedural examination: Secondary | ICD-10-CM | POA: Insufficient documentation

## 2012-09-10 DIAGNOSIS — I700 Atherosclerosis of aorta: Secondary | ICD-10-CM | POA: Insufficient documentation

## 2012-09-10 DIAGNOSIS — R918 Other nonspecific abnormal finding of lung field: Secondary | ICD-10-CM | POA: Insufficient documentation

## 2012-09-10 DIAGNOSIS — I10 Essential (primary) hypertension: Secondary | ICD-10-CM | POA: Insufficient documentation

## 2012-09-10 DIAGNOSIS — Z0183 Encounter for blood typing: Secondary | ICD-10-CM | POA: Insufficient documentation

## 2012-09-10 DIAGNOSIS — Z01812 Encounter for preprocedural laboratory examination: Secondary | ICD-10-CM | POA: Insufficient documentation

## 2012-09-10 LAB — BASIC METABOLIC PANEL
BUN: 8 mg/dL (ref 6–23)
CO2: 29 mEq/L (ref 19–32)
Calcium: 9.7 mg/dL (ref 8.4–10.5)
Chloride: 104 mEq/L (ref 96–112)
Creatinine, Ser: 0.73 mg/dL (ref 0.50–1.10)
GFR calc Af Amer: >90 mL/min (ref >90)
GFR calc non Af Amer: 84 mL/min — ABNORMAL LOW (ref >90)
Glucose, Bld: 100 mg/dL — ABNORMAL HIGH (ref 70–99)
Potassium: 4.1 mEq/L (ref 3.5–5.1)
Sodium: 141 mEq/L (ref 135–145)

## 2012-09-10 LAB — ABO/RH: ABO/RH(D): A POS

## 2012-09-10 LAB — CBC WITH DIFFERENTIAL/PLATELET
Basophils Absolute: 0 10*3/uL (ref 0.0–0.1)
Basophils Relative: 0 % (ref 0–1)
Eosinophils Absolute: 0.1 10*3/uL (ref 0.0–0.7)
Eosinophils Relative: 1 % (ref 0–5)
HCT: 42.6 % (ref 36.0–46.0)
Hemoglobin: 14 g/dL (ref 12.0–15.0)
Lymphocytes Relative: 39 % (ref 12–46)
Lymphs Abs: 2.2 10*3/uL (ref 0.7–4.0)
MCH: 28.1 pg (ref 26.0–34.0)
MCHC: 32.9 g/dL (ref 30.0–36.0)
MCV: 85.5 fL (ref 78.0–100.0)
Monocytes Absolute: 0.4 10*3/uL (ref 0.1–1.0)
Monocytes Relative: 6 % (ref 3–12)
Neutro Abs: 3 10*3/uL (ref 1.7–7.7)
Neutrophils Relative %: 53 % (ref 43–77)
Platelets: 177 10*3/uL (ref 150–400)
RBC: 4.98 MIL/uL (ref 3.87–5.11)
RDW: 12.8 % (ref 11.5–15.5)
WBC: 5.6 10*3/uL (ref 4.0–10.5)

## 2012-09-10 LAB — SURGICAL PCR SCREEN
MRSA, PCR: NEGATIVE
Staphylococcus aureus: NEGATIVE

## 2012-09-10 LAB — PROTIME-INR
INR: 2.19 — ABNORMAL HIGH (ref 0.00–1.49)
Prothrombin Time: 23.4 seconds — ABNORMAL HIGH (ref 11.6–15.2)

## 2012-09-10 LAB — APTT: aPTT: 38 seconds — ABNORMAL HIGH (ref 24–37)

## 2012-09-13 ENCOUNTER — Telehealth: Payer: Self-pay | Admitting: Gynecologic Oncology

## 2012-09-13 NOTE — Telephone Encounter (Signed)
Telephone call to check on pre-operative status.  Patient complaint with pre-operative instructions.  Reinforced NPO after midnight.  No questions or concerns voiced.  Instructed to call for any needs. 

## 2012-09-13 NOTE — Anesthesia Preprocedure Evaluation (Addendum)
Anesthesia Evaluation  Patient identified by MRN, date of birth, ID band Patient awake    Reviewed: Allergy & Precautions, H&P , NPO status , Patient's Chart, lab work & pertinent test results  History of Anesthesia Complications (+) PONV  Airway Mallampati: I TM Distance: >3 FB Neck ROM: full    Dental no notable dental hx. (+) Teeth Intact and Caps,    Pulmonary neg pulmonary ROS,    Pulmonary exam normal       Cardiovascular hypertension, Pt. on medications + Peripheral Vascular Disease and DVT negative cardio ROS  Rhythm:Regular Rate:Normal     Neuro/Psych negative psych ROS   GI/Hepatic Neg liver ROS, GERD-  Medicated,  Endo/Other  negative endocrine ROS  Renal/GU negative Renal ROS  negative genitourinary   Musculoskeletal negative musculoskeletal ROS (+)   Abdominal Normal abdominal exam  (+)   Peds negative pediatric ROS (+)  Hematology negative hematology ROS (+)   Anesthesia Other Findings   Reproductive/Obstetrics negative OB ROS                          Anesthesia Physical Anesthesia Plan  ASA: III  Anesthesia Plan: General   Post-op Pain Management:    Induction: Intravenous  Airway Management Planned: Oral ETT  Additional Equipment:   Intra-op Plan:   Post-operative Plan: Extubation in OR  Informed Consent: I have reviewed the patients History and Physical, chart, labs and discussed the procedure including the risks, benefits and alternatives for the proposed anesthesia with the patient or authorized representative who has indicated his/her understanding and acceptance.   Dental advisory given  Plan Discussed with: CRNA  Anesthesia Plan Comments:         Anesthesia Quick Evaluation

## 2012-09-14 ENCOUNTER — Encounter (HOSPITAL_COMMUNITY): Payer: Self-pay

## 2012-09-14 ENCOUNTER — Inpatient Hospital Stay (HOSPITAL_COMMUNITY)
Admission: RE | Admit: 2012-09-14 | Discharge: 2012-09-15 | DRG: 742 | Disposition: A | Discharge status: Home / Self Care | Payer: Medicare Other | Source: Ambulatory Visit | Attending: Obstetrics & Gynecology | Admitting: Obstetrics & Gynecology

## 2012-09-14 ENCOUNTER — Inpatient Hospital Stay (HOSPITAL_COMMUNITY): Payer: Medicare Other | Admitting: Anesthesiology

## 2012-09-14 ENCOUNTER — Encounter (HOSPITAL_COMMUNITY): Payer: Self-pay | Admitting: Anesthesiology

## 2012-09-14 ENCOUNTER — Encounter (HOSPITAL_COMMUNITY)
Admission: RE | Disposition: A | Discharge status: Home / Self Care | Payer: Self-pay | Source: Ambulatory Visit | Attending: Obstetrics & Gynecology

## 2012-09-14 DIAGNOSIS — K219 Gastro-esophageal reflux disease without esophagitis: Secondary | ICD-10-CM | POA: Diagnosis present

## 2012-09-14 DIAGNOSIS — Z860100 Personal history of colon polyps, unspecified: Secondary | ICD-10-CM

## 2012-09-14 DIAGNOSIS — D259 Leiomyoma of uterus, unspecified: Secondary | ICD-10-CM | POA: Diagnosis present

## 2012-09-14 DIAGNOSIS — E7800 Pure hypercholesterolemia, unspecified: Secondary | ICD-10-CM | POA: Diagnosis present

## 2012-09-14 DIAGNOSIS — I82509 Chronic embolism and thrombosis of unspecified deep veins of unspecified lower extremity: Secondary | ICD-10-CM | POA: Diagnosis present

## 2012-09-14 DIAGNOSIS — Z7901 Long term (current) use of anticoagulants: Secondary | ICD-10-CM

## 2012-09-14 DIAGNOSIS — Z96659 Presence of unspecified artificial knee joint: Secondary | ICD-10-CM

## 2012-09-14 DIAGNOSIS — I10 Essential (primary) hypertension: Secondary | ICD-10-CM | POA: Diagnosis present

## 2012-09-14 DIAGNOSIS — N8502 Endometrial intraepithelial neoplasia [EIN]: Principal | ICD-10-CM | POA: Diagnosis present

## 2012-09-14 HISTORY — PX: ROBOTIC ASSISTED TOTAL HYSTERECTOMY WITH BILATERAL SALPINGO OOPHERECTOMY: SHX6086

## 2012-09-14 LAB — TYPE AND SCREEN
ABO/RH(D): A POS
Antibody Screen: NEGATIVE

## 2012-09-14 LAB — PROTIME-INR
INR: 1.01 (ref 0.00–1.49)
Prothrombin Time: 13.2 seconds (ref 11.6–15.2)

## 2012-09-14 SURGERY — ROBOTIC ASSISTED TOTAL HYSTERECTOMY WITH BILATERAL SALPINGO OOPHORECTOMY
Anesthesia: General | Laterality: Right | Wound class: Clean Contaminated

## 2012-09-14 MED ORDER — METOPROLOL TARTRATE 25 MG PO TABS
25.0000 mg | ORAL_TABLET | Freq: Every evening | ORAL | Status: DC
  Administered 2012-09-14: 25 mg via ORAL
  Filled 2012-09-14 (×2): qty 1

## 2012-09-14 MED ORDER — GLYCOPYRROLATE 0.2 MG/ML IJ SOLN
INTRAMUSCULAR | Status: DC | PRN
  Administered 2012-09-14: 0.2 mg via INTRAVENOUS
  Administered 2012-09-14: 0.6 mg via INTRAVENOUS

## 2012-09-14 MED ORDER — NEOSTIGMINE METHYLSULFATE 1 MG/ML IJ SOLN
INTRAMUSCULAR | Status: DC | PRN
  Administered 2012-09-14: 5 mg via INTRAVENOUS

## 2012-09-14 MED ORDER — CEFAZOLIN SODIUM-DEXTROSE 2-3 GM-% IV SOLR
2.0000 g | INTRAVENOUS | Status: AC
  Administered 2012-09-14: 2 g via INTRAVENOUS

## 2012-09-14 MED ORDER — ROCURONIUM BROMIDE 100 MG/10ML IV SOLN
INTRAVENOUS | Status: DC | PRN
  Administered 2012-09-14: 5 mg via INTRAVENOUS
  Administered 2012-09-14: 50 mg via INTRAVENOUS

## 2012-09-14 MED ORDER — LIDOCAINE HCL (CARDIAC) 20 MG/ML IV SOLN
INTRAVENOUS | Status: DC | PRN
  Administered 2012-09-14: 100 mg via INTRAVENOUS

## 2012-09-14 MED ORDER — KCL IN DEXTROSE-NACL 20-5-0.45 MEQ/L-%-% IV SOLN
INTRAVENOUS | Status: DC
  Administered 2012-09-14 – 2012-09-15 (×3): via INTRAVENOUS
  Filled 2012-09-14 (×4): qty 1000

## 2012-09-14 MED ORDER — STERILE WATER FOR IRRIGATION IR SOLN
Status: DC | PRN
  Administered 2012-09-14: 1500 mL

## 2012-09-14 MED ORDER — TRAMADOL HCL 50 MG PO TABS
50.0000 mg | ORAL_TABLET | Freq: Four times a day (QID) | ORAL | Status: DC | PRN
  Administered 2012-09-14 (×2): 50 mg via ORAL
  Filled 2012-09-14 (×2): qty 1

## 2012-09-14 MED ORDER — MIDAZOLAM HCL 5 MG/5ML IJ SOLN
INTRAMUSCULAR | Status: DC | PRN
  Administered 2012-09-14: 2 mg via INTRAVENOUS

## 2012-09-14 MED ORDER — ZOLPIDEM TARTRATE 5 MG PO TABS: 5.0000 mg | ORAL_TABLET | Freq: Every evening | ORAL | Status: DC | PRN

## 2012-09-14 MED ORDER — LACTATED RINGERS IV SOLN
INTRAVENOUS | Status: DC | PRN
  Administered 2012-09-14: 07:00:00 via INTRAVENOUS

## 2012-09-14 MED ORDER — LACTATED RINGERS IR SOLN
Status: DC | PRN
  Administered 2012-09-14: 100 mL

## 2012-09-14 MED ORDER — FENTANYL CITRATE 0.05 MG/ML IJ SOLN
INTRAMUSCULAR | Status: DC | PRN
  Administered 2012-09-14 (×7): 50 ug via INTRAVENOUS

## 2012-09-14 MED ORDER — KETAMINE HCL 10 MG/ML IJ SOLN
INTRAMUSCULAR | Status: DC | PRN
  Administered 2012-09-14: 20 mg via INTRAVENOUS

## 2012-09-14 MED ORDER — MORPHINE SULFATE 10 MG/ML IJ SOLN: 1.0000 mg | INTRAMUSCULAR | Status: DC | PRN

## 2012-09-14 MED ORDER — HYDROMORPHONE HCL PF 1 MG/ML IJ SOLN: 0.5000 mg | INTRAMUSCULAR | Status: DC | PRN

## 2012-09-14 MED ORDER — METOCLOPRAMIDE HCL 5 MG/ML IJ SOLN
INTRAMUSCULAR | Status: DC | PRN
  Administered 2012-09-14: 10 mg via INTRAVENOUS

## 2012-09-14 MED ORDER — PROPOFOL 10 MG/ML IV BOLUS
INTRAVENOUS | Status: DC | PRN
  Administered 2012-09-14: 150 mg via INTRAVENOUS

## 2012-09-14 MED ORDER — BENAZEPRIL HCL 10 MG PO TABS
10.0000 mg | ORAL_TABLET | Freq: Every evening | ORAL | Status: DC
  Administered 2012-09-14: 10 mg via ORAL
  Filled 2012-09-14 (×2): qty 1

## 2012-09-14 MED ORDER — PROMETHAZINE HCL 25 MG/ML IJ SOLN: 6.2500 mg | INTRAMUSCULAR | Status: DC | PRN

## 2012-09-14 MED ORDER — ACETAMINOPHEN 10 MG/ML IV SOLN
1000.0000 mg | Freq: Four times a day (QID) | INTRAVENOUS | Status: AC
  Administered 2012-09-14 – 2012-09-15 (×3): 1000 mg via INTRAVENOUS
  Filled 2012-09-14 (×3): qty 100

## 2012-09-14 MED ORDER — ONDANSETRON HCL 4 MG PO TABS: 4.0000 mg | ORAL_TABLET | Freq: Four times a day (QID) | ORAL | Status: DC | PRN

## 2012-09-14 MED ORDER — AMLODIPINE BESYLATE 5 MG PO TABS
5.0000 mg | ORAL_TABLET | Freq: Every evening | ORAL | Status: DC
  Administered 2012-09-14: 5 mg via ORAL
  Filled 2012-09-14 (×2): qty 1

## 2012-09-14 MED ORDER — ONDANSETRON HCL 4 MG/2ML IJ SOLN
INTRAMUSCULAR | Status: DC | PRN
  Administered 2012-09-14: 4 mg via INTRAVENOUS

## 2012-09-14 MED ORDER — DEXAMETHASONE SODIUM PHOSPHATE 10 MG/ML IJ SOLN
INTRAMUSCULAR | Status: DC | PRN
  Administered 2012-09-14: 10 mg via INTRAVENOUS

## 2012-09-14 MED ORDER — PANTOPRAZOLE SODIUM 40 MG PO TBEC: 40.0000 mg | DELAYED_RELEASE_TABLET | Freq: Every day | ORAL | Status: DC | PRN

## 2012-09-14 MED ORDER — SUCCINYLCHOLINE CHLORIDE 20 MG/ML IJ SOLN
INTRAMUSCULAR | Status: DC | PRN
  Administered 2012-09-14: 100 mg via INTRAVENOUS

## 2012-09-14 MED ORDER — ONDANSETRON HCL 4 MG/2ML IJ SOLN: 4.0000 mg | Freq: Four times a day (QID) | INTRAMUSCULAR | Status: DC | PRN

## 2012-09-14 MED ORDER — LACTATED RINGERS IV SOLN: INTRAVENOUS | Status: DC

## 2012-09-14 MED ORDER — SIMVASTATIN 10 MG PO TABS
10.0000 mg | ORAL_TABLET | Freq: Every day | ORAL | Status: DC
  Administered 2012-09-14: 10 mg via ORAL
  Filled 2012-09-14 (×2): qty 1

## 2012-09-14 SURGICAL SUPPLY — 52 items
APL SKNCLS STERI-STRIP NONHPOA (GAUZE/BANDAGES/DRESSINGS) ×1
BAG SPEC RTRVL LRG 6X4 10 (ENDOMECHANICALS) ×2
BENZOIN TINCTURE PRP APPL 2/3 (GAUZE/BANDAGES/DRESSINGS) ×2 IMPLANT
CHLORAPREP W/TINT 26ML (MISCELLANEOUS) ×2 IMPLANT
CLOTH BEACON ORANGE TIMEOUT ST (SAFETY) ×2 IMPLANT
CORD HIGH FREQUENCY UNIPOLAR (ELECTROSURGICAL) ×2 IMPLANT
CORDS BIPOLAR (ELECTRODE) ×2 IMPLANT
COVER MAYO STAND STRL (DRAPES) ×2 IMPLANT
COVER SURGICAL LIGHT HANDLE (MISCELLANEOUS) ×2 IMPLANT
COVER TIP SHEARS 8 DVNC (MISCELLANEOUS) ×1 IMPLANT
COVER TIP SHEARS 8MM DA VINCI (MISCELLANEOUS) ×1
DRAPE LG THREE QUARTER DISP (DRAPES) ×4 IMPLANT
DRAPE SURG IRRIG POUCH 19X23 (DRAPES) ×2 IMPLANT
DRAPE TABLE BACK 44X90 PK DISP (DRAPES) ×4 IMPLANT
DRAPE UTILITY XL STRL (DRAPES) ×2 IMPLANT
DRAPE WARM FLUID 44X44 (DRAPE) ×2 IMPLANT
DRSG TEGADERM 2-3/8X2-3/4 SM (GAUZE/BANDAGES/DRESSINGS) ×1 IMPLANT
DRSG TEGADERM 6X8 (GAUZE/BANDAGES/DRESSINGS) ×4 IMPLANT
ELECT REM PT RETURN 9FT ADLT (ELECTROSURGICAL) ×2
ELECTRODE REM PT RTRN 9FT ADLT (ELECTROSURGICAL) ×1 IMPLANT
GLOVE BIO SURGEON STRL SZ 6.5 (GLOVE) ×4 IMPLANT
GLOVE BIOGEL PI IND STRL 7.0 (GLOVE) ×2 IMPLANT
GLOVE BIOGEL PI INDICATOR 7.0 (GLOVE) ×2
GOWN PREVENTION PLUS XLARGE (GOWN DISPOSABLE) ×4 IMPLANT
GOWN STRL NON-REIN LRG LVL3 (GOWN DISPOSABLE) ×6 IMPLANT
HOLDER FOLEY CATH W/STRAP (MISCELLANEOUS) ×2 IMPLANT
KIT ACCESSORY DA VINCI DISP (KITS) ×1
KIT ACCESSORY DVNC DISP (KITS) ×1 IMPLANT
MANIPULATOR UTERINE 4.5 ZUMI (MISCELLANEOUS) ×2 IMPLANT
OCCLUDER COLPOPNEUMO (BALLOONS) ×2 IMPLANT
PACK LAPAROSCOPY W LONG (CUSTOM PROCEDURE TRAY) ×2 IMPLANT
POUCH SPECIMEN RETRIEVAL 10MM (ENDOMECHANICALS) ×4 IMPLANT
SET TUBE IRRIG SUCTION NO TIP (IRRIGATION / IRRIGATOR) ×2 IMPLANT
SHEET LAVH (DRAPES) ×2 IMPLANT
SOLUTION ELECTROLUBE (MISCELLANEOUS) ×2 IMPLANT
SPONGE LAP 18X18 X RAY DECT (DISPOSABLE) IMPLANT
STRIP CLOSURE SKIN 1/2X4 (GAUZE/BANDAGES/DRESSINGS) ×2 IMPLANT
SUT VIC AB 0 CT1 27 (SUTURE) ×6
SUT VIC AB 0 CT1 27XBRD ANTBC (SUTURE) ×3 IMPLANT
SUT VIC AB 2-0 SH 27 (SUTURE) ×2
SUT VIC AB 2-0 SH 27X BRD (SUTURE) IMPLANT
SUT VIC AB 4-0 PS2 27 (SUTURE) ×4 IMPLANT
SUT VICRYL 0 UR6 27IN ABS (SUTURE) ×2 IMPLANT
SYR 50ML LL SCALE MARK (SYRINGE) ×2 IMPLANT
SYR BULB IRRIGATION 50ML (SYRINGE) IMPLANT
TOWEL OR 17X26 10 PK STRL BLUE (TOWEL DISPOSABLE) ×4 IMPLANT
TRAP SPECIMEN MUCOUS 40CC (MISCELLANEOUS) ×1 IMPLANT
TRAY FOLEY CATH 14FRSI W/METER (CATHETERS) ×2 IMPLANT
TROCAR 12M 150ML BLUNT (TROCAR) ×2 IMPLANT
TROCAR XCEL 12X100 BLDLESS (ENDOMECHANICALS) ×2 IMPLANT
TROCAR XCEL BLADELESS 5X75MML (TROCAR) ×2 IMPLANT
WATER STERILE IRR 1500ML POUR (IV SOLUTION) ×4 IMPLANT

## 2012-09-14 NOTE — Anesthesia Postprocedure Evaluation (Signed)
Anesthesia Post Note  Patient: Bonnie Zhang  Procedure(s) Performed: Procedure(s) (LRB): ROBOTIC ASSISTED TOTAL HYSTERECTOMY WITH BILATERAL SALPINGO OOPHORECTOMY ,right pelvic LYMPHNODE dissection (Right)  Anesthesia type: General  Patient location: PACU  Post pain: Pain level controlled  Post assessment: Post-op Vital signs reviewed  Last Vitals:  Filed Vitals:   09/14/12 1100  BP: 122/52  Pulse: 57  Temp: 36.4 C  Resp: 14    Post vital signs: Reviewed  Level of consciousness: sedated  Complications: No apparent anesthesia complications

## 2012-09-14 NOTE — Preoperative (Signed)
Beta Blockers   Reason not to administer Beta Blockers:Not Applicable Pt took Beta Blocker 09-13-12 at 1700

## 2012-09-14 NOTE — Op Note (Signed)
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   Postoperative Diagnosis: same.   Surgery: Total robotic hysterectomy, bilateral salpingo-oophorectomy right pelvic lymph node dissection.   Surgeons:  Bernita Buffy. Duard Brady, MD; Antionette Char, MD   Anesthesia: General   Estimated blood loss: 50 ml   IVF: 1800 ml   Urine output: 600 ml   Complications: None   Pathology: Uterus, cervix, bilateral tubes and ovaries, right pelvic lymph nodes to pathology.   Operative findings: Adhesions of the bladder to the uterus from prior cesarean sections.  Frozen section with no evidence of cancer, possible hyperplasia within adenomyosis. No deep invasion.   Procedure: The patient was identified in the preoperative holding area. Informed consent was signed on the chart. Patient was seen history was reviewed and exam was performed.   The patient was then taken to the operating room and placed in the supine position with SCD hose on. General anesthesia was then induced without difficulty. She was then placed in the dorsolithotomy position. Her arms were tucked at her side with appropriate precautions on the gel pad. Shoulder blocks were then placed in the usual fashion with appropriate precautions. A OG-tube was placed to suction. First timeout was performed to confirm the patient, procedure, antibiotic, allergy status, estimated blood loss and OR time. The perineum was then prepped in the usual fashion with Betadine. A 14 French Foley was inserted into the bladder under sterile conditions. A sterile speculum was placed in the vagina. The cervix was without lesions. The cervix was grasped with a single-tooth tenaculum. The dilator without difficulty. A ZUMI with a small Koe ring was placed without difficulty. The abdomen was then prepped with 1 Chlor prep sponges  per protocol.   Patient was then draped after the prep was dried. Second timeout was performed to confirm the above. After again confirming OG tube placement and it was to suction. A stab-wound was made in left upper quadrant 2 cm below the costal margin on the left in the midclavicular line. A 5 mm operative report was used to assure intra-abdominal placement. The abdomen was insufflated. At this point all points during the procedure the patient's intra-abdominal pressure was not increased over 15 mm of mercury. After insufflation was complete, the patient was placed in deep Trendelenburg position. 25 cm above the pubic symphysis that area was marked the camera port. Bilateral robotic ports were marked 10 cm from the midline incision at approximately 5 angle and a left lower quadrant port in the LLQ 2 cm medial and above the ACIS. Under direct visualization each of the trochars was placed into the abdomen. The small bowel was folded on its mesentery to allow visualization to the pelvis. The 5 mm LUQ port was then converted to a 10/12 port under direct visualization.  After assuring adequate visualization, the robot was then docked in the usual fashion. Under direct visualization the robotic instruments replaced.   The round ligament on the patient's right side was transected with monopolar cautery. The anterior and posterior leaves of the broad ligament were then taken down in the usual fashion. The ureter was identified on the medial leaf of the broad ligament. A window was made between the IP and the ureter. The IP was coagulated with bipolar cautery and transected. The posterior leaf of the broad ligament was taken down to the level of the KOH ring. The  bladder flap was created using meticulous dissection and pinpoint cautery. The uterine vessels were coagulated with bipolar cautery. The uterine vessels were then transected and the C loop was created. The same procedure was performed on the patient's left  side.   The pneumo-occulder in the vagina was then insufflated. The colpotomy was then created in the usual fashion. The specimen was then delivered to the vagina and sent for frozen section there was some bleeding noted on the uterine vessels on her left side. These were coagulated with bipolar cautery. Our attention was then drawn to opening the paravesical space on her right side the perirectal space was also opened. The obturator nerve was identified. The nodal bundle extending over the external iliac artery down to the external iliac vein was taken down using sharp dissection and monopolar cautery. The genitofemoral nerve was identified and spared. We continued our dissection down to the level of the obturator nerve. The nodal bundle superior to the obturator nerve was taken. All pedicles were noted to be hemostatic the ureter was noted to be well medial of the area of dissection. The nodal bundle was then placed and an Endo catch bag.  All specimens were delivered. At this point frozen section returned as no evidence of cancer and possible hyperplasia within adenomyosis. The decision was made to stop the procedure is no further lymphadenectomy was indicated.   The vaginal cuff was closed with a running 0 Vicryl on CT 1 suture. The abdomen and pelvis were copiously irrigated and noted to be hemostatic. The robotic instruments were removed under direct visualization as were the robotic trochars. The pneumoperitoneum was removed. The patient was then taken out of the Trendelenburg position. Using of 0 Vicryl on a UR 6 needle the midline port port in the left upper quadrant ports were reapproximated. The skin was closed using 4-0 Vicryl. Steri-Strips and benzoin were applied. The pneumo occluded balloon was removed from the vagina. The vagina was swabbed and a small introital laceration was noted.  It was sutured with a running 2.0 vicryl on an SH needle. There was adequate hemostasis afterwards.  All  instrument needle and Ray-[employer] counts were correct x2. The patient tolerated the procedure well and was taken to the recovery room in stable condition. This is Myla Mauriello [facility] dictating an operative on [employer].

## 2012-09-14 NOTE — Progress Notes (Signed)
Utilization review completed.  

## 2012-09-14 NOTE — Interval H&P Note (Signed)
History and Physical Interval Note:  09/14/2012 7:01 AM  Bonnie Zhang  has presented today for surgery, with the diagnosis of Complex hyperplasia  The various methods of treatment have been discussed with the patient and family. After consideration of risks, benefits and other options for treatment, the patient has consented to  Procedure(s): ROBOTIC ASSISTED TOTAL HYSTERECTOMY WITH BILATERAL SALPINGO OOPHORECTOMY POSSIBLE LYMPHNODES (Bilateral) as a surgical intervention .  The patient's history has been reviewed, patient examined, no change in status, stable for surgery.  I have reviewed the patient's chart and labs.  Questions were answered to the patient's satisfaction.     Piotr Christopher A.

## 2012-09-14 NOTE — H&P (View-Only) (Signed)
Consult Note: Gyn-Onc  Consult was requested by Dr. Pratt  for the evaluation of Bonnie Zhang 72 y.o. 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  is a 72 y.o.  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   I extensively reviewed the risks of robotic hysterectomy and possible lymph node dissection of infection, bleeding, damage to nearby organs including bowel, bladder, vessels, nerves, and ureters. We discussed postoperative risks including infection, PE/ DVT, and lymphedema. We reviewed possible need for conversion to open laparotomy, need for possible blood transfusion. I discussed positioning during surgery of trendelenberg and risks of minor facial swelling and care we take in preoperative positioning. We also reviewed possible need for further treatment such as radiation or chemotherapy based on final pathology. Expected postoperative recovery was also discussed.  On Coumadin for chromic DVT.  WIll require perioperative conversion to lovenox. F/U  with PCP for plan for perioperative anticoagulation strategy   HPI:  72 year-old gravida 3 para 3 last which appeared in her 50s.   She reports history of vaginal bleeding approximately 2 years ago. Endometrial evaluation  2012 c/w benign endometrium and polyps. Patient noted vaginal bleeding since April of 2014. Pelvic UTZ 06/2012 Uterus: 8.0 x 2.7 x 3.7 cm. Heterogeneous echogenicity of the uterine myometrium noted. A 1.4 by 1.2 x 1.0 cm partially calcified uterine fibroid is again seen in the posterior lower uterine segment. No other discrete fibroids identified. Endometrium: Double layer thickness measures 9 mm transvaginally. No focal lesion visualized. Right ovary: not directly visualized by transabdominal or transvaginal sonography, however no adnexal mass identified. Left ovary: not directly visualized by transabdominal or  transvaginal sonography, however no adnexal mass identified.  No free fluid    08/05/2012 D&C Hysteroscopy - AT LEAST ATYPICAL COMPLEX ENDOMETRIAL HYPERPLASIA, PLEASE SEE COMMENT. Microscopic Comment Sections show atypical complex endometrial hyperplasia with a focal area worrisome for well differentiated endometrioid carcinoma.   Current Meds:  Outpatient Encounter Prescriptions as of 08/19/2012  Medication Sig Dispense Refill  . amLODipine (NORVASC) 5 MG tablet TAKE ONE TABLET BY MOUTH EVERY DAY  90 tablet  1  . benazepril (LOTENSIN) 10 MG tablet       . Cholecalciferol (VITAMIN D) 2000 UNITS CAPS Take 2,000 Units by mouth daily.        . metoprolol tartrate (LOPRESSOR) 25 MG tablet Take 25 mg by mouth daily.      . pantoprazole (PROTONIX) 40 MG tablet Take 40 mg by mouth daily as needed (heartburn/reflux).      . simvastatin (ZOCOR) 10 MG tablet Take 10 mg by mouth at bedtime.      . warfarin (COUMADIN) 5 MG tablet Take 2.5-5 mg by mouth daily. Current regimen: 5mg-Monday, Tuesday, Thursday, Friday & Sunday                             2.5mg-Wednesday & Saturday       No facility-administered encounter medications on file as of 08/19/2012.    Allergy:  Allergies  Allergen Reactions  . Aspirin Other (See Comments)    upset stomach  . Atorvastatin Other (See Comments)    leg pain  . Hctz (Hydrochlorothiazide) Other (See Comments)    FATIGUE AND EXTREMELY LOW POTASSIUM    Social Hx:   History   Social History  . Marital Status: Divorced      Spouse Name: N/A    Number of Children: N/A  . Years of Education: N/A   Occupational History  . Not on file.   Social History Main Topics  . Smoking status: Never Smoker   . Smokeless tobacco: Never Used  . Alcohol Use: No  . Drug Use: No  . Sexually Active: No   Other Topics Concern  . Not on file   Social History Narrative  . No narrative on file    Past Surgical Hx:  Past Surgical History  Procedure Laterality Date  .  Cesarean section  1961/1965/1966    X 3  . Colonoscopy w/ polypectomy  2007    Dr  Patterson; due? 2014  . Breast lumpectomy  1987  . Cholecystectomy  1997  . Dilation and curettage of uterus  2012    uterine polyp, Dr Pratt  . Appendectomy  1965  . Esophagogastroduodenoscopy  1997  . Total knee arthroplasty  08/11/2011    Procedure: TOTAL KNEE ARTHROPLASTY;  Surgeon: Robert A Wainer, MD;  Location: [facility] OR;  Service: Orthopedics;  Laterality: Right;  DR WAINER WANTS 90 MINUTES FOR THIS CASE  . Wisdom tooth extraction    . Hysteroscopy w/d&c N/A 08/05/2012    Procedure: DILATATION AND CURETTAGE /HYSTEROSCOPY;  Surgeon: Tanya S Pratt, MD;  Location: [facility]S;  Service: Gynecology;  Laterality: N/A;    Past Medical Hx:  Past Medical History  Diagnosis Date  . High cholesterol     takes Simvastating daily  . Vitamin D deficiency     takes VIt D daily  . DVT (deep vein thrombosis) in pregnancy      1965 post C section;2001 with prolonged driving while on  HRT  . Osteoporosis   . Hypertension     takes Benazepril,Amlodipine,and Metoprolol daily  . Hx of migraines     yrs ago   . Bruises easily     pt is on Coumadin;last dose of Coumadin 08/05/11 and then Lovenox started 08/07/11  . GERD (gastroesophageal reflux disease)   . Hx of colonic polyps     Dr Patterson  . PONV (postoperative nausea and vomiting)   . PVD (peripheral vascular disease) 1965, 2001    abnormal venous doppler findings due to recurrent DVT'S left leg  . Right knee DJD     knees  . Headache     history - years ago, no prob x 40 yrs per pt    Past Gynecological History:  Gravida 3 para 3 cesarean section x3 in acute crit at age of 13 with regular menses until her 50s. Denies history of abnormal Pap test.  D&C 2 years ago for vaginal bleeding was remarkable for the presence of polyps and no neoplasia  Family Hx:  Family History  Problem Relation Age of Onset  . Kidney disease Mother     ? hypertensive  .  Hypertension Mother   . Deep vein thrombosis Mother     post ankle fracture  . Leukemia Father   . Colon cancer Maternal Grandmother   . Diabetes Other     cousins  . Anesthesia problems Neg Hx   . Hypotension Neg Hx   . Malignant hyperthermia Neg Hx   . Pseudochol deficiency Neg Hx   . Stroke Neg Hx   . Heart disease Neg Hx     Review of Systems:  Constitutional  Feels well,  denies weight loss Cardiovascular  No chest pain, shortness of breath, or edema  Pulmonary    No cough or wheeze.  Gastro Intestinal  No nausea, vomitting, or diarrhoea. No bright red blood per rectum, no abdominal pain, change in bowel movement, or constipation. Denies any change in appetite early satiety, or abdominal bloating Genito Urinary  No frequency, urgency, dysuria, denies vaginal bleeding Musculo Skeletal  No myalgia, arthralgia, joint swelling or pain  Neurologic  No weakness, numbness, change in gait,  Psychology  No depression, anxiety, insomnia.   Vitals:  BP 140/82  Pulse 86  Temp(Src) 99 F (37.2 C)  Resp 16  Ht 5' 3.25" (1.607 m)  Wt 178 lb 8 oz (80.967 kg)  BMI 31.35 kg/m2   Wt Readings from Last 3 Encounters:  08/19/12 178 lb 8 oz (80.967 kg)  08/11/12 180 lb (81.647 kg)  07/27/12 180 lb (81.647 kg)    Physical Exam: WD in NAD Neck  Supple NROM, without any enlargements.  Lymph Node Survey No cervical supraclavicular or inguinal adenopathy Cardiovascular  Pulse normal rate, regularity and rhythm.   Lungs  Clear to auscultation bilateraly,  Good air movement.  Skin  No rash/lesions/breakdown  Psychiatry  Alert and oriented to person, place, and time  Abdomen  Normoactive bowel sounds, abdomen soft, non-tender and obese. Surgical midline incision is intact without evidence of hernia.  Back No CVA tenderness Genito Urinary  Vulva/vagina: Normal external female genitalia.  No lesions. No discharge or bleeding.  Bladder/urethra:  No lesions or masses  Vagina:  Atrophic no vaginal bleeding  Cervix: Normal appearing proximal to the 1 cm, no lesions.  Uterus: Able to assess size no parametrial involvement or nodularity.  Adnexa: No palpable masses. Rectal  Good tone, no masses no cul de sac nodularity.  Extremities  No bilateral cyanosis, clubbing or edema.   Belmira Daley, MD, PhD 08/19/2012, 3:53 PM      

## 2012-09-14 NOTE — Transfer of Care (Signed)
Immediate Anesthesia Transfer of Care Note  Patient: Bonnie Zhang  Procedure(s) Performed: Procedure(s) (LRB): ROBOTIC ASSISTED TOTAL HYSTERECTOMY WITH BILATERAL SALPINGO OOPHORECTOMY ,right pelvic LYMPHNODE dissection (Right)  Patient Location: PACU  Anesthesia Type: General  Level of Consciousness: sedated, patient cooperative and responds to stimulaton  Airway & Oxygen Therapy: Patient Spontanous Breathing and Patient connected to face mask oxgen  Post-op Assessment: Report given to PACU RN and Post -op Vital signs reviewed and stable  Post vital signs: Reviewed and stable  Complications: No apparent anesthesia complications

## 2012-09-15 ENCOUNTER — Encounter (HOSPITAL_COMMUNITY): Payer: Self-pay | Admitting: Gynecologic Oncology

## 2012-09-15 LAB — CBC
HCT: 41.1 % (ref 36.0–46.0)
Hemoglobin: 13.5 g/dL (ref 12.0–15.0)
MCH: 27.9 pg (ref 26.0–34.0)
MCHC: 32.8 g/dL (ref 30.0–36.0)
MCV: 84.9 fL (ref 78.0–100.0)
Platelets: 190 10*3/uL (ref 150–400)
RBC: 4.84 MIL/uL (ref 3.87–5.11)
RDW: 12.5 % (ref 11.5–15.5)
WBC: 9.4 10*3/uL (ref 4.0–10.5)

## 2012-09-15 LAB — BASIC METABOLIC PANEL
BUN: 6 mg/dL (ref 6–23)
CO2: 28 mEq/L (ref 19–32)
Calcium: 9.4 mg/dL (ref 8.4–10.5)
Chloride: 104 mEq/L (ref 96–112)
Creatinine, Ser: 0.73 mg/dL (ref 0.50–1.10)
GFR calc Af Amer: >90 mL/min (ref >90)
GFR calc non Af Amer: 84 mL/min — ABNORMAL LOW (ref >90)
Glucose, Bld: 174 mg/dL — ABNORMAL HIGH (ref 70–99)
Potassium: 4.6 mEq/L (ref 3.5–5.1)
Sodium: 138 mEq/L (ref 135–145)

## 2012-09-15 MED ORDER — ENOXAPARIN SODIUM 40 MG/0.4ML ~~LOC~~ SOLN
40.0000 mg | Freq: Once | SUBCUTANEOUS | Status: AC
  Administered 2012-09-15: 40 mg via SUBCUTANEOUS
  Filled 2012-09-15: qty 0.4

## 2012-09-15 NOTE — Discharge Summary (Signed)
Physician Discharge Summary  Patient ID: Bonnie Zhang MRN: [id number] DOB/AGE: 01-Apr-1940 72 y.o.  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  Discharged Condition:  The patient is in good condition and stable for discharge.    Hospital Course: On 09/14/2012, the patient underwent the following: Procedure(s): ROBOTIC ASSISTED TOTAL HYSTERECTOMY WITH BILATERAL SALPINGO OOPHORECTOMY ,right pelvic LYMPHNODE dissection.  The postoperative course was uneventful.  She was discharged to home on postoperative day 1 tolerating a regular diet.  She is to begin her medication regimen consisting of Lovenox and Coumadin tomorrow per Dr. Tamela Oddi.  Dates on the below schedule were adjusted and given to the patient.  Consults: None  Significant Diagnostic Studies: None  Treatments: surgery: see above  Discharge Exam: Blood pressure 136/57, pulse 67, temperature 97.6 F (36.4 C), temperature source Oral, resp. rate 18, height 5\' 1"  (1.549 m), weight 183 lb 6.4 oz (83.19 kg), SpO2 98.00%. General appearance: alert, cooperative and no distress Resp: clear to auscultation bilaterally Cardio: regular rate and rhythm, S1, S2 normal, no murmur, click, rub or gallop GI: soft, non-tender; bowel sounds normal; no masses,  no organomegaly Extremities: extremities normal, atraumatic, no cyanosis or edema Incision/Wound: Lap sites with steri strips to the abdomen clean, dry, and intact  Disposition: 01-Home or Self Care  Discharge Orders   Future Appointments Provider Department Dept Phone   09/21/2012 9:30 AM [facility] [phone]   11/11/2012 10:45 AM Paola A. Duard Brady, MD [facility] [phone]   Future Orders  Complete By Expires     Call MD for:  difficulty breathing, headache or visual disturbances  As directed     Call MD for:  extreme fatigue  As directed     Call MD for:  hives  As directed     Call MD for:  persistant dizziness or light-headedness  As directed     Call MD for:  persistant nausea and vomiting  As directed     Call MD for:  redness, tenderness, or signs of infection (pain, swelling, redness, odor or green/yellow discharge around incision site)  As directed     Call MD for:  severe uncontrolled pain  As directed     Call MD for:  temperature >100.4  As directed     Diet - low sodium heart healthy  As directed     Discharge instructions  As directed     Comments:      Begin following printed instructions for resuming lovenox and coumadin tomorrow.    Driving Restrictions  As directed     Comments:      No driving for 1 week.  Do not take narcotics and drive.    Increase activity slowly  As directed     Lifting restrictions  As directed     Comments:      No lifting greater than 10 lbs.    Sexual Activity Restrictions  As directed     Comments:      No sexual activity, nothing in the vagina, for 8 weeks.        Medication List    TAKE these medications       amLODipine 5 MG tablet  Commonly known as:  NORVASC  Take 5 mg by mouth every evening.     benazepril 10 MG  tablet  Commonly known as:  LOTENSIN  Take 10 mg by mouth every evening.     enoxaparin 120 MG/0.8ML injection  Commonly known as:  LOVENOX  Inject 120 mg into the skin. 6-12  Take last dose of coumadin until after surgery.  6-13 Do not take coumadin and do not take Lovenox  6-14 - Take Lovenox in am  6-15 - Take Lovenox in am  6-16 - Take Lovenox in am  6-17 - Surgery (Do not take Lovenox or coumadin)  6-18 - Take 7.5 mg coumadin and Lovenox in AM  6-19 - Take 7.5 mg coumadin and Lovenox in AM  6-20 - Take 5 mg coumadin and Lovenox in AM  6-21 - Take 5 mg coumadin and Lovenox in  AM  6-22 - Take 5 mg coumadin and Lovenox in AM  6-23 - Take 5 mg coumadin and Lovenox in AM  6-24 - Re-check in coumadin clinic.     metoprolol tartrate 25 MG tablet  Commonly known as:  LOPRESSOR  Take 25 mg by mouth every evening.     pantoprazole 40 MG tablet  Commonly known as:  PROTONIX  Take 40 mg by mouth daily as needed (heartburn/reflux).     simvastatin 10 MG tablet  Commonly known as:  ZOCOR  Take 10 mg by mouth at bedtime.     Vitamin D 2000 UNITS Caps  Take 2,000 Units by mouth every morning.     warfarin 5 MG tablet  Commonly known as:  COUMADIN  Take 2.5-5 mg by mouth daily. Current regimen: 5mg -Monday, Tuesday, Thursday, Friday & Sunday                              2.5mg -Wednesday & Saturday           Follow-up Information   Follow up with [facility] [place] A[facility] MD On 11/11/2012. (at 10:45am at the [facility])    Contact information:   [address] [place] [address] [phone]       Greater than thirty minutes were spend for face to face discharge instructions and discharge orders/summary in EPIC.   Signed: CROSS, MELISSA DEAL 09/15/2012, 1:51 PM

## 2012-09-16 ENCOUNTER — Telehealth: Payer: Self-pay | Admitting: Gynecologic Oncology

## 2012-09-16 NOTE — Telephone Encounter (Signed)
Post op telephone call to check patient status.  Patient describes expected post operative status.  Adequate PO intake reported.  Bowels and bladder functioning without difficulty.  Pain minimal.  Reportable signs and symptoms reviewed.  Patient informed of final pathology results.  Follow up appt arranged for August with Dr. Duard Brady.  Instructed to call for any needs.

## 2012-09-20 ENCOUNTER — Telehealth: Payer: Self-pay | Admitting: Gynecologic Oncology

## 2012-09-20 ENCOUNTER — Telehealth: Payer: Self-pay | Admitting: Pharmacist

## 2012-09-20 NOTE — Telephone Encounter (Signed)
New problem    Pt just said she needs someone from coumadin to call her

## 2012-09-20 NOTE — Telephone Encounter (Signed)
Pt follows with [facility] but [facility] unavailable today.  Had hysterectomy last week and question about Lovenox.  She was prescribed 120mg  once daily.  She has 2- 60mg  syringes.  Wanted to know if she should take them both at the same time or at different times.  Suggested she take them at the same time so we are still getting 1.5mg /kg/daily dosing.

## 2012-09-20 NOTE — Telephone Encounter (Signed)
Attempted to call and check on patient's post-op status.  Line busy.

## 2012-09-21 ENCOUNTER — Ambulatory Visit (INDEPENDENT_AMBULATORY_CARE_PROVIDER_SITE_OTHER): Payer: Medicare Other | Admitting: General Practice

## 2012-09-21 DIAGNOSIS — Z7901 Long term (current) use of anticoagulants: Secondary | ICD-10-CM

## 2012-09-21 DIAGNOSIS — Z86718 Personal history of other venous thrombosis and embolism: Secondary | ICD-10-CM

## 2012-09-21 LAB — POCT INR: INR: 1.7

## 2012-09-24 ENCOUNTER — Other Ambulatory Visit: Payer: Self-pay | Admitting: Internal Medicine

## 2012-09-24 NOTE — Telephone Encounter (Signed)
Refill done.  

## 2012-09-29 NOTE — Addendum Note (Signed)
Addendum created 09/29/12 1657 by Fabienne Bruns, RN   Modules edited: Anesthesia Responsible Staff

## 2012-10-06 ENCOUNTER — Ambulatory Visit (INDEPENDENT_AMBULATORY_CARE_PROVIDER_SITE_OTHER): Payer: Medicare Other | Admitting: General Practice

## 2012-10-06 DIAGNOSIS — Z7901 Long term (current) use of anticoagulants: Secondary | ICD-10-CM

## 2012-10-06 DIAGNOSIS — Z86718 Personal history of other venous thrombosis and embolism: Secondary | ICD-10-CM

## 2012-10-06 LAB — POCT INR: INR: 2.6

## 2012-11-03 ENCOUNTER — Ambulatory Visit (INDEPENDENT_AMBULATORY_CARE_PROVIDER_SITE_OTHER): Payer: Medicare Other | Admitting: General Practice

## 2012-11-03 DIAGNOSIS — Z7901 Long term (current) use of anticoagulants: Secondary | ICD-10-CM

## 2012-11-03 DIAGNOSIS — Z86718 Personal history of other venous thrombosis and embolism: Secondary | ICD-10-CM

## 2012-11-03 LAB — POCT INR: INR: 2.9

## 2012-11-11 ENCOUNTER — Encounter: Payer: Self-pay | Admitting: Gynecologic Oncology

## 2012-11-11 ENCOUNTER — Ambulatory Visit: Payer: Medicare Other | Attending: Gynecologic Oncology | Admitting: Gynecologic Oncology

## 2012-11-11 VITALS — BP 130/70 | HR 88 | Temp 97.9°F | Resp 18 | Ht 63.25 in | Wt 180.6 lb

## 2012-11-11 DIAGNOSIS — Z79899 Other long term (current) drug therapy: Secondary | ICD-10-CM | POA: Insufficient documentation

## 2012-11-11 DIAGNOSIS — Z90710 Acquired absence of both cervix and uterus: Secondary | ICD-10-CM | POA: Insufficient documentation

## 2012-11-11 DIAGNOSIS — N8502 Endometrial intraepithelial neoplasia [EIN]: Secondary | ICD-10-CM | POA: Insufficient documentation

## 2012-11-11 DIAGNOSIS — Z9079 Acquired absence of other genital organ(s): Secondary | ICD-10-CM | POA: Insufficient documentation

## 2012-11-11 DIAGNOSIS — C541 Malignant neoplasm of endometrium: Secondary | ICD-10-CM

## 2012-11-11 NOTE — Progress Notes (Signed)
Consult Note: Gyn-Onc  Bonnie Zhang 72 y.o. female  CC:  Chief Complaint  Patient presents with  . Vaginal Bleeding    Follow up    HPI:72 year-old gravida 3 para 3 last which appeared in her 48s. She reports history of vaginal bleeding approximately 2 years ago. Endometrial evaluation 2012 c/w benign endometrium and polyps. Patient noted vaginal bleeding since April of 2014. Pelvic UTZ 06/2012 Uterus: 8.0 x 2.7 x 3.7 cm. Heterogeneous echogenicity of the uterine myometrium noted. A 1.4 by 1.2 x 1.0 cm partially calcified uterine fibroid is again seen in the posterior lower uterine segment. No other discrete fibroids identified. Endometrium: Double layer thickness measures 9 mm transvaginally. No focal lesion visualized. Right ovary: not directly visualized by transabdominal or transvaginal sonography, however no adnexal mass identified. Left ovary: not directly visualized by transabdominal or transvaginal sonography, however no adnexal mass identified. No free fluid.  08/05/2012 D&C Hysteroscopy  - AT LEAST ATYPICAL COMPLEX ENDOMETRIAL HYPERPLASIA, PLEASE SEE COMMENT.  Microscopic Comment Sections show atypical complex endometrial hyperplasia with a focal area worrisome for well differentiated endometrioid carcinoma.   Interval History:  On 09/14/2012 she underwent a total robotic hysterectomy bilateral salpingo-oophorectomy and right pelvic lymph node dissection. Operative findings included adhesions of the bladder to the uterus from prior cesarean sections. Frozen section with no evidence of cancer, possible hyperplasia within adenomyosis. No deep invasion.   Final pathology revealed: Diagnosis 1. Uterus and cervix, bilateral tubes and ovaries - EXTENSIVE ATYPICAL COMPLEX HYPERPLASIA. - LEIOMYOMA AND ADENOMYOSIS. - CERVIX: BENIGN SQUAMOUS MUCOSA AND ENDOCERVICAL MUCOSA. - BILATERAL FALLOPIAN TUBES AND OVARIES: BENIGN FALLOPIAN TUBAL TISSUE AND OVARIAN TISSUE. NO EVIDENCE OF  ENDOMETRIOSIS, ATYPIA OR MALIGNANCY. 2. Lymph nodes, regional resection, right pelvic - FOUR LYMPH NODES, NO EVIDENCE OF MALIGNANCY.   She comes in today for her postoperative check. She states that she is recovering very well. She had minimal pink discharge at the time of her hospital discharge. About 2 weeks ago she had some Brown discharge with no bright blood. She's had none since that time. She denies any abdominal or pelvic pain. She's had no issues with her bowel or bladder habits. Her energy level is about 85-90% of her baseline.  Current Meds:  Outpatient Encounter Prescriptions as of 11/11/2012  Medication Sig Dispense Refill  . amLODipine (NORVASC) 5 MG tablet Take 5 mg by mouth every evening.      . benazepril-hydrochlorthiazide (LOTENSIN HCT) 10-12.5 MG per tablet Take 1 tablet by mouth daily.      . Cholecalciferol (VITAMIN D) 2000 UNITS CAPS Take 2,000 Units by mouth every morning.       . metoprolol (LOPRESSOR) 50 MG tablet TAKE ONE-HALF TABLET BY MOUTH EVERY DAY  45 tablet  1  . pantoprazole (PROTONIX) 40 MG tablet Take 40 mg by mouth daily as needed (heartburn/reflux).      . simvastatin (ZOCOR) 10 MG tablet Take 10 mg by mouth at bedtime.      Marland Kitchen warfarin (COUMADIN) 5 MG tablet       . [DISCONTINUED] warfarin (COUMADIN) 5 MG tablet TAKE AS DIRECTED  90 tablet  0  . [DISCONTINUED] benazepril (LOTENSIN) 10 MG tablet Take 10 mg by mouth every evening.       . [DISCONTINUED] enoxaparin (LOVENOX) 120 MG/0.8ML injection Inject 120 mg into the skin. 6-12  Take last dose of coumadin until after surgery. 6-13 Do not take coumadin and do not take Lovenox 6-14 - Take Lovenox in am 6-15 -  Take Lovenox in am 6-16 - Take Lovenox in am 6-17 - Surgery (Do not take Lovenox or coumadin) 6-18 - Take 7.5 mg coumadin and Lovenox in AM 6-19 - Take 7.5 mg coumadin and Lovenox in AM 6-20 - Take 5 mg coumadin and Lovenox in AM 6-21 - Take 5 mg coumadin and Lovenox in AM 6-22 - Take 5 mg coumadin  and Lovenox in AM 6-23 - Take 5 mg coumadin and Lovenox in AM 6-24 - Re-check in coumadin clinic.      . [DISCONTINUED] metoprolol tartrate (LOPRESSOR) 25 MG tablet Take 25 mg by mouth every evening.       . [DISCONTINUED] warfarin (COUMADIN) 5 MG tablet Take 2.5-5 mg by mouth daily. Current regimen: 5mg -Monday, Tuesday, Thursday, Friday & Sunday                             2.5mg-Wednesday & Saturday       No facility-administered encounter medications on file as of 11/11/2012.    Allergy:  Allergies  Allergen Reactions  . Vicodin [Hydrocodone-Acetaminophen]     Nightmares  . Aspirin Other (See Comments)    upset stomach  . Atorvastatin Other (See Comments)    leg pain  . Hctz [Hydrochlorothiazide] Other (See Comments)    FATIGUE AND EXTREMELY LOW POTASSIUM    Social Hx:   History   Social History  . Marital Status: Divorced    Spouse Name: N/A    Number of Children: N/A  . Years of Education: N/A   Occupational History  . Not on file.   Social History Main Topics  . Smoking status: Never Smoker   . Smokeless tobacco: Never Used  . Alcohol Use: No  . Drug Use: No  . Sexual Activity: No   Other Topics Concern  . Not on file   Social History Narrative  . No narrative on file    Past Surgical Hx:  Past Surgical History  Procedure Laterality Date  . Cesarean section  1961/1965/1966    X 3  . Colonoscopy w/ polypectomy  2007    Dr  Patterson; due? 2014  . Cholecystectomy  1997  . Dilation and curettage of uterus  2012    uterine polyp, Dr Pratt  . Appendectomy  1965  . Esophagogastroduodenoscopy  1997  . Total knee arthroplasty  08/11/2011    Procedure: TOTAL KNEE ARTHROPLASTY;  Surgeon: Robert A Wainer, MD;  Location: [facility] OR;  Service: Orthopedics;  Laterality: Right;  DR WAINER WANTS 90 MINUTES FOR THIS CASE  . Wisdom tooth extraction    . Hysteroscopy w/d&c N/A 08/05/2012    Procedure: DILATATION AND CURETTAGE /HYSTEROSCOPY;  Surgeon: Tanya S Pratt, MD;   Location: [facility];  Service: Gynecology;  Laterality: N/A;  . Breast lumpectomy  1987  . Robotic assisted total hysterectomy with bilateral salpingo oopherectomy Right 09/14/2012    Procedure: ROBOTIC ASSISTED TOTAL HYSTERECTOMY WITH BILATERAL SALPINGO OOPHORECTOMY ,right pelvic LYMPHNODE dissection;  Surgeon: Knox Cervi A. Jacobe Study, MD;  Location: [facility];  Service: Gynecology;  Laterality: Right;    Past Medical Hx:  Past Medical History  Diagnosis Date  . High cholesterol     takes Simvastating daily  . Vitamin D deficiency     takes VIt D daily  . DVT (deep vein thrombosis) in pregnancy      [age] post C section;2001 with prolonged driving while on  HRT  . Osteoporosis   . Hypertension  takes Benazepril,Amlodipine,and Metoprolol daily  . Hx of migraines     yrs ago   . Bruises easily     pt is on Coumadin;last dose of Coumadin 08/05/11 and then Lovenox started 08/07/11  . GERD (gastroesophageal reflux disease)   . Hx of colonic polyps     Dr Jarold Motto  . PONV (postoperative nausea and vomiting)   . PVD (peripheral vascular disease) 1965, 2001    abnormal venous doppler findings due to recurrent DVT'S left leg  . Right knee DJD     knees  . Headache(784.0)     history - years ago, no prob x 40 yrs per pt    Oncology Hx:   No history exists.    Family Hx:  Family History  Problem Relation Age of Onset  . Kidney disease Mother     ? hypertensive  . Hypertension Mother   . Deep vein thrombosis Mother     post ankle fracture  . Leukemia Father   . Colon cancer Maternal Grandmother   . Diabetes Other     cousins  . Anesthesia problems Neg Hx   . Hypotension Neg Hx   . Malignant hyperthermia Neg Hx   . Pseudochol deficiency Neg Hx   . Stroke Neg Hx   . Heart disease Neg Hx     Vitals:  Blood pressure 130/70, pulse 88, temperature 97.9 F (36.6 C), temperature source Oral, resp. rate 18, height 5' 3.25" (1.607 m), weight 180 lb 9.6 oz (81.92 kg).  Physical  Exam: Well-nourished well-developed female in no acute distress.  Abdomen: Well-healed surgical incisions. Abdomen is soft, nontender, nondistended.  Pelvic: Normal external female genitalia. The vagina is atrophic. The vaginal cuff is visualized. It is intact. Is healing well. Bimanual examination reveals no masses, fluctuance, or tenderness. There is no evidence of any blood.  Assessment/Plan: 72 year old with complex hyperplasia with atypia status post definitive surgery. Final pathology did not reveal any carcinoma. She's doing very well postoperatively. She'll be released from our clinic and knows that we'll be happy to see her when necessary.  She will followup with her other physicians as scheduled.  Eleyna Brugh A., MD 11/11/2012, 11:34 AM

## 2012-11-25 ENCOUNTER — Encounter: Payer: Self-pay | Admitting: Gastroenterology

## 2012-12-01 ENCOUNTER — Ambulatory Visit (INDEPENDENT_AMBULATORY_CARE_PROVIDER_SITE_OTHER): Payer: Medicare Other | Admitting: General Practice

## 2012-12-01 DIAGNOSIS — Z7901 Long term (current) use of anticoagulants: Secondary | ICD-10-CM

## 2012-12-01 DIAGNOSIS — Z86718 Personal history of other venous thrombosis and embolism: Secondary | ICD-10-CM

## 2012-12-01 LAB — POCT INR: INR: 3.6

## 2012-12-05 ENCOUNTER — Encounter: Payer: Self-pay | Admitting: Internal Medicine

## 2012-12-08 NOTE — Addendum Note (Signed)
Addendum created 12/08/12 1905 by Sandrea Hughs., MD   Modules edited: Anesthesia Responsible Staff

## 2012-12-10 ENCOUNTER — Telehealth: Payer: Self-pay | Admitting: Internal Medicine

## 2012-12-10 DIAGNOSIS — Z7901 Long term (current) use of anticoagulants: Secondary | ICD-10-CM

## 2012-12-10 NOTE — Telephone Encounter (Signed)
Patient called about a   Medication refill warfarin (COUMADIN) 5 MG tablet also states that her pharmacy sent a request over and have not heard from [place].   Pharmacy  [facility] on [address]

## 2012-12-13 NOTE — Telephone Encounter (Signed)
Patient is calling back about her medication. States that the pharmacy gave her 3 pills to hold her over but she is about out.

## 2012-12-15 ENCOUNTER — Other Ambulatory Visit: Payer: Self-pay | Admitting: *Deleted

## 2012-12-15 MED ORDER — WARFARIN SODIUM 5 MG PO TABS: 5.0000 mg | ORAL_TABLET | Freq: Every day | ORAL | Status: DC

## 2012-12-15 NOTE — Telephone Encounter (Signed)
Refill for warfarin sent to [facility] on [address]

## 2012-12-15 NOTE — Telephone Encounter (Signed)
Patient has been calling since Friday and says she left 2 messages on the triage line for this refill. She is willing to make an appointment to see Dr. Alwyn Ren if that what she has to do for a refill. Patient says she has been a patient of Dr. Frederik Pear for 15 years and has never encountered this issue before. Says she gets a 90-day supply. Please call when ready.

## 2012-12-22 ENCOUNTER — Ambulatory Visit (INDEPENDENT_AMBULATORY_CARE_PROVIDER_SITE_OTHER): Payer: Medicare Other | Admitting: General Practice

## 2012-12-22 DIAGNOSIS — Z86718 Personal history of other venous thrombosis and embolism: Secondary | ICD-10-CM

## 2012-12-22 DIAGNOSIS — Z7901 Long term (current) use of anticoagulants: Secondary | ICD-10-CM

## 2012-12-22 LAB — POCT INR: INR: 2.5

## 2013-01-09 ENCOUNTER — Encounter: Payer: Self-pay | Admitting: Internal Medicine

## 2013-01-10 ENCOUNTER — Telehealth: Payer: Self-pay | Admitting: *Deleted

## 2013-01-10 ENCOUNTER — Encounter: Payer: Self-pay | Admitting: *Deleted

## 2013-01-10 NOTE — Telephone Encounter (Signed)
Message copied by Verdie Shire on Mon Jan 10, 2013  4:54 PM ------      Message from: Pecola Lawless      Created: Sun Jan 09, 2013 10:28 AM       Please make appointment to discuss long term risk & options related to Bone Density results. Osteoporosis present with increased fracture risk . Bring       The following data is critical to assess that risk:      Past medical history of fracture:      Family history of osteoporosis:      Vitamin D supplement status:      Prior bone-building therapy and duration of therapy      Please bring all medications & supplements to that appt             ------

## 2013-01-10 NOTE — Telephone Encounter (Signed)
Letter mailed.//AB/CMA 

## 2013-01-16 ENCOUNTER — Other Ambulatory Visit: Payer: Self-pay | Admitting: Internal Medicine

## 2013-01-17 ENCOUNTER — Other Ambulatory Visit: Payer: Self-pay | Admitting: *Deleted

## 2013-01-17 MED ORDER — PANTOPRAZOLE SODIUM 40 MG PO TBEC: 40.0000 mg | DELAYED_RELEASE_TABLET | Freq: Every day | ORAL | Status: DC | PRN

## 2013-01-17 NOTE — Telephone Encounter (Signed)
Protonix refill sent to pharmacy 

## 2013-01-21 ENCOUNTER — Ambulatory Visit (INDEPENDENT_AMBULATORY_CARE_PROVIDER_SITE_OTHER): Payer: Medicare Other | Admitting: Family Medicine

## 2013-01-21 DIAGNOSIS — Z86718 Personal history of other venous thrombosis and embolism: Secondary | ICD-10-CM

## 2013-01-21 DIAGNOSIS — Z7901 Long term (current) use of anticoagulants: Secondary | ICD-10-CM

## 2013-01-21 LAB — POCT INR: INR: 3.3

## 2013-01-25 ENCOUNTER — Encounter: Payer: Self-pay | Admitting: Internal Medicine

## 2013-01-27 ENCOUNTER — Ambulatory Visit (INDEPENDENT_AMBULATORY_CARE_PROVIDER_SITE_OTHER): Payer: Medicare Other | Admitting: Internal Medicine

## 2013-01-27 ENCOUNTER — Encounter: Payer: Self-pay | Admitting: Internal Medicine

## 2013-01-27 VITALS — BP 159/72 | HR 75 | Temp 98.3°F | Resp 12 | Wt 176.6 lb

## 2013-01-27 DIAGNOSIS — M810 Age-related osteoporosis without current pathological fracture: Secondary | ICD-10-CM

## 2013-01-27 DIAGNOSIS — I10 Essential (primary) hypertension: Secondary | ICD-10-CM

## 2013-01-27 LAB — BASIC METABOLIC PANEL
BUN: 11 mg/dL (ref 6–23)
CO2: 29 mEq/L (ref 19–32)
Calcium: 10 mg/dL (ref 8.4–10.5)
Chloride: 105 mEq/L (ref 96–112)
Creatinine, Ser: 0.8 mg/dL (ref 0.4–1.2)
GFR: 74 mL/min (ref >60.00)
Glucose, Bld: 111 mg/dL — ABNORMAL HIGH (ref 70–99)
Potassium: 4.4 mEq/L (ref 3.5–5.1)
Sodium: 143 mEq/L (ref 135–145)

## 2013-01-27 NOTE — Assessment & Plan Note (Addendum)
Contact your insurance plan to determine whether the generic Reclast or branded Prolia  is less expensive.They would be equally efficacious but the cost may vary significantly from plan to plan.

## 2013-01-27 NOTE — Progress Notes (Signed)
  Subjective:    Patient ID: Bonnie Zhang, female    DOB: 1941/02/16, 72 y.o.   MRN: [id number]  HPI  Her most recent bone density results were reviewed. She has a T score of -2.9 at the femoral neck with significant elevation of fracture risk.  She has never had any fractures. She does have a history of reflux for which she takes a PPI as needed.  Her vitamin D level  & TSH were therapeutic in January of this year.  S/P TKR 2013 which has limited weight bearing exercise.  Family history is negative for osteoporosis.  See BP ; BP @ home  134-145/73-78.Significant headaches, epistaxis, chest pain, palpitations, exertional dyspnea, claudication, paroxysmal nocturnal dyspnea, or edema absent.No lightheadedness or medication adverse effect described. Compliant with anti hypertemsive medication.Creatinine normal in 1/14   Review of Systems    She had a total hysterectomy and BSO in June of this year for abnormal cells on D&C. There was no sign of malignancy and no further treatment was initiated.     Objective:   Physical Exam Gen.: well-nourished in appearance. Alert, appropriate and cooperative throughout exam.Appears younger than stated age   Neck: No deformities, masses, or tenderness noted. Range of motion decreased. Thyroid normal. Appears healthy and well-nourished & in no acute distress  No carotid bruits are present.No neck pain distention present at 10 - 15 degrees. Thyroid normal to palpation  Heart rhythm and rate are normal with no significant murmurs or gallops.  Chest is clear with no increased work of breathing  There is no evidence of aortic aneurysm or renal artery bruits  Abdomen soft with no organomegaly or masses. No HJR  No clubbing, cyanosis or edema present.  Pedal pulses are intact   No ischemic skin changes are present . Nails healthy                                   Musculoskeletal/extremities: No deformity or scoliosis noted of  the thoracic  or lumbar spine.  Minor accentuated curvature of upper thoracic  Spine. No clubbing, cyanosis, edema, or significant extremity  deformity noted. Range of motion decreased R > L knee .Tone & strength  Normal. Joints normal. Nail health good. Able to lie down & sit up w/o help. Negative SLR bilaterally Neurologic: Alert and oriented x3. Deep tendon reflexes symmetrical and normal.         Skin: Intact without suspicious lesions or rashes.  Psych: Mood and affect are normal. Normally interactive                                                                                        Assessment & Plan:  See Current Assessment & Plan in Problem List under specific Diagnosis

## 2013-01-27 NOTE — Assessment & Plan Note (Signed)
BMET BP goals discussed 

## 2013-01-27 NOTE — Patient Instructions (Signed)
Your next office appointment will be determined based upon review of your pending labs . Those instructions will be transmitted to you  by mail . Please contact the customer representative of your insurance plan to determine whether the generic Reclast or branded Prolia  is less expensive.They would be equally efficacious but the cost may vary significantly from plan to plan. Only your insurance company would be able to determine the actual cost of either. Minimal Blood Pressure Goal= AVERAGE < 140/90;  Ideal is an AVERAGE < 135/85. This AVERAGE should be calculated from @ least 5-7 BP readings taken @ different times of day on different days of week. You should not respond to isolated BP readings , but rather the AVERAGE for that week .Please bring your  blood pressure cuff to office visits to verify that it is reliable.It  can also be checked against the blood pressure device at the pharmacy. Finger or wrist cuffs are not dependable; an arm cuff is.

## 2013-01-31 ENCOUNTER — Encounter: Payer: Self-pay | Admitting: *Deleted

## 2013-01-31 NOTE — Progress Notes (Signed)
Letter mailed to patient.

## 2013-02-08 ENCOUNTER — Encounter: Payer: Self-pay | Admitting: Internal Medicine

## 2013-02-18 ENCOUNTER — Ambulatory Visit (INDEPENDENT_AMBULATORY_CARE_PROVIDER_SITE_OTHER): Payer: Medicare Other | Admitting: General Practice

## 2013-02-18 ENCOUNTER — Other Ambulatory Visit: Payer: Self-pay | Admitting: General Practice

## 2013-02-18 DIAGNOSIS — Z7901 Long term (current) use of anticoagulants: Secondary | ICD-10-CM

## 2013-02-18 DIAGNOSIS — Z86718 Personal history of other venous thrombosis and embolism: Secondary | ICD-10-CM

## 2013-02-18 LAB — POCT INR: INR: 2.3

## 2013-02-18 MED ORDER — WARFARIN SODIUM 5 MG PO TABS: ORAL_TABLET | ORAL | Status: DC

## 2013-02-18 NOTE — Progress Notes (Signed)
Pre-visit discussion using our clinic review tool. No additional management support is needed unless otherwise documented below in the visit note.  

## 2013-02-28 ENCOUNTER — Other Ambulatory Visit: Payer: Self-pay | Admitting: Internal Medicine

## 2013-02-28 NOTE — Telephone Encounter (Signed)
Benazepril and Amlodipine refilled per protocol.

## 2013-03-08 ENCOUNTER — Ambulatory Visit (INDEPENDENT_AMBULATORY_CARE_PROVIDER_SITE_OTHER): Payer: Medicare Other | Admitting: Internal Medicine

## 2013-03-08 VITALS — BP 137/86 | HR 80 | Temp 97.6°F | Wt 177.0 lb

## 2013-03-08 DIAGNOSIS — R42 Dizziness and giddiness: Secondary | ICD-10-CM

## 2013-03-08 DIAGNOSIS — H9202 Otalgia, left ear: Secondary | ICD-10-CM

## 2013-03-08 DIAGNOSIS — R51 Headache: Secondary | ICD-10-CM

## 2013-03-08 DIAGNOSIS — R519 Headache, unspecified: Secondary | ICD-10-CM

## 2013-03-08 DIAGNOSIS — H9209 Otalgia, unspecified ear: Secondary | ICD-10-CM

## 2013-03-08 DIAGNOSIS — M26609 Unspecified temporomandibular joint disorder, unspecified side: Secondary | ICD-10-CM

## 2013-03-08 NOTE — Progress Notes (Signed)
   Subjective:    Patient ID: Bonnie Zhang, female    DOB: 1941/03/22, 72 y.o.   MRN: [id number]  HPI   Intermittently over the last 2 weeks she's had aching discomfort in the left ear which she describes as up to level V-6, lasting 1-2 hours. There's also soreness when she lies on the left ear.  No hearing loss or tinnitus reported.  She's noted some slight dizziness upon standing  She has had no frontal headaches which responds to Tylenol.  She states that she's had some aching in the ears over the years when exposed to cold or wind.    Review of Systems  There is no associated discharge from the left ear. There's been no change in the color or temperature of the ear. She also denies extrinsic symptoms of itchy, watery eyes, sneezing  She's had no fever, chills, or sweats.    She has no facial pain, nasal purulence, or dental pain.     Objective:   Physical Exam General appearance:good health ;well nourished; no acute distress or increased work of breathing is present.  No  lymphadenopathy about the head, neck, or axilla noted.   Eyes: No conjunctival inflammation or lid edema is present. Extraocular motion is intact without nystagmus  Ears:  External ear exam shows no significant lesions or deformities. Faint erythema of the external ear is present. There is minimal tenderness on the left to palpation. Otoscopic examination reveals clear canals, tympanic membranes are intact bilaterally without bulging, retraction, inflammation or discharge. There is striking crepitus of the TMJ joints  & dislocation on the left with mastication maneuvers. Tuning fork exam is normal. Whisper heard at 6 feet  Nose:  External nasal examination shows no deformity or inflammation. Nasal mucosa are pink and moist without lesions or exudates. No septal dislocation or deviation.No obstruction to airflow.   Oral exam: Dental hygiene is good; lips and gums are healthy appearing.There is no  oropharyngeal erythema or exudate noted.   Neck:  No deformities,  masses, or tenderness noted.   Supple with full range of motion without pain.   Heart:  Normal rate and regular rhythm. S1 and S2 normal without gallop, murmur, click, rub or other extra sounds.   Lungs:Chest clear to auscultation; no wheezes, rhonchi,rales ,or rubs present.No increased work of breathing.    Extremities:  No cyanosis, edema, or clubbing  noted    Skin: Warm & dry .          Assessment & Plan:  #1 ear pain; this is most likely due to the dramatic temporomandibular joint dislocation with mastication maneuvers  #2 some postural dizziness  #3 some frontal headache which may be sinus in etiology  Plan: Inhaled nasal steroids will be recommended for the frontal headaches. I would recommend that she see her dentist to discuss therapy of the TMJ. Isometrics pre standing

## 2013-03-08 NOTE — Patient Instructions (Signed)
Repeat the isometric exercises discussed 4- 5 times prior to standing if you've been seated for a period of time. Nasacort AQ OTC 1 spray in each nostril twice a day as needed. Use the "crossover" technique into opposite nostril spraying toward opposite ear @ 45 degree angle, not straight up into nostril.  Soft or liquid diet if having pain at the temporomandibular joints. Use warm moist compresses to 3 times a day over the painful area. Please see dentist to see if your candidate for a oral appliance to be worn at night.

## 2013-03-18 ENCOUNTER — Ambulatory Visit (INDEPENDENT_AMBULATORY_CARE_PROVIDER_SITE_OTHER): Payer: Medicare Other | Admitting: General Practice

## 2013-03-18 DIAGNOSIS — Z7901 Long term (current) use of anticoagulants: Secondary | ICD-10-CM

## 2013-03-18 DIAGNOSIS — Z86718 Personal history of other venous thrombosis and embolism: Secondary | ICD-10-CM

## 2013-03-18 LAB — POCT INR: INR: 2.8

## 2013-03-18 NOTE — Progress Notes (Signed)
Pre-visit discussion using our clinic review tool. No additional management support is needed unless otherwise documented below in the visit note.  

## 2013-04-01 ENCOUNTER — Other Ambulatory Visit: Payer: Self-pay | Admitting: *Deleted

## 2013-04-01 MED ORDER — METOPROLOL TARTRATE 50 MG PO TABS: ORAL_TABLET | ORAL | Status: DC

## 2013-04-12 ENCOUNTER — Telehealth: Payer: Self-pay

## 2013-04-12 NOTE — Telephone Encounter (Signed)
Medication and allergies:  Reviewed and updated  90 day supply/mail order: n/a Local pharmacy:  [facility]   Immunizations due: None, Declined   A/P: No changes to personal, family history or past surgical hx CCS-Plan to schedule with Dr. Sharlett Iles MMG-01/04/13-Negative BD10/7/14-Osteoporosis Flu-Declined Tdap-03/21/10 PNA-Declined Shingles-Declined  To Discuss with Provider: Not at this time.

## 2013-04-14 ENCOUNTER — Ambulatory Visit (INDEPENDENT_AMBULATORY_CARE_PROVIDER_SITE_OTHER): Payer: Medicare Other | Admitting: Internal Medicine

## 2013-04-14 ENCOUNTER — Encounter: Payer: Self-pay | Admitting: Internal Medicine

## 2013-04-14 VITALS — BP 129/86 | HR 75 | Temp 98.1°F | Ht 60.5 in | Wt 175.4 lb

## 2013-04-14 DIAGNOSIS — E559 Vitamin D deficiency, unspecified: Secondary | ICD-10-CM

## 2013-04-14 DIAGNOSIS — Z0000 Encounter for general adult medical examination without abnormal findings: Secondary | ICD-10-CM

## 2013-04-14 DIAGNOSIS — I10 Essential (primary) hypertension: Secondary | ICD-10-CM

## 2013-04-14 DIAGNOSIS — R7309 Other abnormal glucose: Secondary | ICD-10-CM

## 2013-04-14 DIAGNOSIS — E782 Mixed hyperlipidemia: Secondary | ICD-10-CM

## 2013-04-14 DIAGNOSIS — Z860100 Personal history of colon polyps, unspecified: Secondary | ICD-10-CM

## 2013-04-14 DIAGNOSIS — R7302 Impaired glucose tolerance (oral): Secondary | ICD-10-CM | POA: Insufficient documentation

## 2013-04-14 LAB — BASIC METABOLIC PANEL
BUN: 13 mg/dL (ref 6–23)
CO2: 26 mEq/L (ref 19–32)
Calcium: 9.7 mg/dL (ref 8.4–10.5)
Chloride: 107 mEq/L (ref 96–112)
Creatinine, Ser: 0.8 mg/dL (ref 0.4–1.2)
GFR: 71.9 mL/min (ref >60.00)
Glucose, Bld: 101 mg/dL — ABNORMAL HIGH (ref 70–99)
Potassium: 3.8 mEq/L (ref 3.5–5.1)
Sodium: 140 mEq/L (ref 135–145)

## 2013-04-14 LAB — CBC WITH DIFFERENTIAL/PLATELET
Basophils Absolute: 0.1 10*3/uL (ref 0.0–0.1)
Basophils Relative: 0.9 % (ref 0.0–3.0)
Eosinophils Absolute: 0.1 10*3/uL (ref 0.0–0.7)
Eosinophils Relative: 1.1 % (ref 0.0–5.0)
HCT: 44.5 % (ref 36.0–46.0)
Hemoglobin: 14.9 g/dL (ref 12.0–15.0)
Lymphocytes Relative: 29.3 % (ref 12.0–46.0)
Lymphs Abs: 1.9 10*3/uL (ref 0.7–4.0)
MCHC: 33.6 g/dL (ref 30.0–36.0)
MCV: 86.7 fl (ref 78.0–100.0)
Monocytes Absolute: 0.4 10*3/uL (ref 0.1–1.0)
Monocytes Relative: 6.3 % (ref 3.0–12.0)
Neutro Abs: 4 10*3/uL (ref 1.4–7.7)
Neutrophils Relative %: 62.4 % (ref 43.0–77.0)
Platelets: 237 10*3/uL (ref 150.0–400.0)
RBC: 5.14 Mil/uL — ABNORMAL HIGH (ref 3.87–5.11)
RDW: 13.4 % (ref 11.5–14.6)
WBC: 6.4 10*3/uL (ref 4.5–10.5)

## 2013-04-14 LAB — LIPID PANEL
Cholesterol: 153 mg/dL (ref 0–200)
HDL: 63.3 mg/dL (ref >39.00)
LDL Cholesterol: 64 mg/dL (ref 0–99)
Total CHOL/HDL Ratio: 2
Triglycerides: 128 mg/dL (ref 0.0–149.0)
VLDL: 25.6 mg/dL (ref 0.0–40.0)

## 2013-04-14 LAB — HEPATIC FUNCTION PANEL
ALT: 13 U/L (ref 0–35)
AST: 17 U/L (ref 0–37)
Albumin: 4.2 g/dL (ref 3.5–5.2)
Alkaline Phosphatase: 91 U/L (ref 39–117)
Bilirubin, Direct: 0.1 mg/dL (ref 0.0–0.3)
Total Bilirubin: 0.4 mg/dL (ref 0.3–1.2)
Total Protein: 7.7 g/dL (ref 6.0–8.3)

## 2013-04-14 LAB — HEMOGLOBIN A1C: Hgb A1c MFr Bld: 5.3 % (ref 4.6–6.5)

## 2013-04-14 LAB — TSH: TSH: 1.11 u[IU]/mL (ref 0.35–5.50)

## 2013-04-14 NOTE — Progress Notes (Signed)
Subjective:    Patient ID: Bonnie Zhang, female    DOB: [birth year], 73 y.o.   MRN: [id number]  HPI  Medicare Wellness Visit: Psychosocial and medical history were reviewed as required by Medicare (history related to abuse, antisocial behavior , firearm risk). Social history: Alcohol:no  , Tobacco use:no, Caffeine : minimal Exercise:limited by DJD Personal safety/fall risk:no Limitations of activities of daily living:none Seatbelt/ smoke alarm use:yes Healthcare Power of Attorney/Living Will status: only Living Willdiscussed Ophthalmologic exam status:UTD Hearing evaluation status:not current Orientation: Oriented X 3 Memory and recall: good Spelling or math testing: completed Depression/anxiety assessment: no Foreign travel history:never Immunization status for influenza/pneumonia/ shingles /tetanus:declines; risk discussed Transfusion history:history post op anemia Preventive health care maintenance status: Colonoscopy/BMD/mammogram/Pap as per protocol/standard care:UTD (see Gyn history) Dental care:current (every 6 mos) Chart reviewed and updated. Active issues reviewed and addressed as documented below.    Review of Systems A modified  heart healthy diet is followed; exercise as noted above.  Family history is negative for premature coronary disease. LDL goal is less than 130 ; ideally < 100 . There is medication compliance with the statin.  Low dose ASA not  Taken (on warfarin) Specifically denied are  chest pain, palpitations, dyspnea, or claudication.  Significant abdominal symptoms, memory deficit, or myalgias not present. BP @ home :     Objective:   Physical Exam Gen.: Healthy and well-nourished in appearance. Alert, appropriate and cooperative throughout exam. Appears younger than stated age  Head: Normocephalic without obvious abnormalities  Eyes: No corneal or conjunctival inflammation noted. Pupils equal round reactive to light and accommodation.  Extraocular motion intact. Ears: External  ear exam reveals no significant lesions or deformities. Canals clear .TMs normal. Hearing is grossly normal bilaterally. Nose: External nasal exam reveals no deformity or inflammation. Nasal mucosa are pink and moist. No lesions or exudates noted.  Mouth: Oral mucosa and oropharynx reveal no lesions or exudates. Teeth in good repair. Neck: No deformities, masses, or tenderness noted. Range of motion slightly decreased. Thyroid normal. Lungs: Normal respiratory effort; chest expands symmetrically. Lungs are clear to auscultation without rales, wheezes, or increased work of breathing. Heart: Normal rate and rhythm. Normal S1 and S2. No gallop, click, or rub.  S4 w/omurmur. Abdomen: Bowel sounds normal; abdomen soft and nontender. No masses, organomegaly or hernias noted. Genitalia:  as per Gyn                                  Musculoskeletal/extremities: Accentuated curvature of upper thoracic spine. . No clubbing, cyanosis, edema, or significant extremity  deformity noted. Range of motion decreased @ knees.Tone & strength normal. Hand joints  reveal mild  DJD DIP changes. Fingernail  health good. Able to lie down & sit up w/o help. Negative SLR bilaterally Vascular: Carotid, radial artery, dorsalis pedis and  posterior tibial pulses are full and equal. No bruits present. Neurologic: Alert and oriented x3. Deep tendon reflexes symmetrical and normal.        Skin: Intact without suspicious lesions or rashes. Lymph: No cervical, axillary lymphadenopathy present. Psych: Mood and affect are normal. Normally interactive  Assessment & Plan:  #1 Medicare Wellness Exam; criteria met ; data entered #2 Problem List/Diagnoses reviewed Plan:  Assessments made/ Orders entered  
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
## 2013-04-14 NOTE — Progress Notes (Signed)
Pre visit review using our clinic review tool, if applicable. No additional management support is needed unless otherwise documented below in the visit note. 

## 2013-04-14 NOTE — Patient Instructions (Signed)
Your next office appointment will be determined based upon review of your pending labs . Those instructions will be transmitted to you  by mail Follow the low carb nutrition program in [facility] as closely as possible to prevent Diabetes  & complications. White carbohydrates (potatoes, rice, bread, and pasta) have a high spike of sugar and a high load of sugar. For example a  baked potato has a cup of sugar and a  french fry  2 teaspoons of sugar. Yams, wild  rice, whole grained bread &  wheat pasta have been much lower spike and load of  sugar.

## 2013-04-15 ENCOUNTER — Ambulatory Visit (INDEPENDENT_AMBULATORY_CARE_PROVIDER_SITE_OTHER): Payer: Medicare Other | Admitting: General Practice

## 2013-04-15 DIAGNOSIS — Z7901 Long term (current) use of anticoagulants: Secondary | ICD-10-CM

## 2013-04-15 DIAGNOSIS — Z86718 Personal history of other venous thrombosis and embolism: Secondary | ICD-10-CM

## 2013-04-15 LAB — POCT INR: INR: 2.5

## 2013-04-15 NOTE — Progress Notes (Signed)
Pre-visit discussion using our clinic review tool. No additional management support is needed unless otherwise documented below in the visit note.  

## 2013-04-17 LAB — VITAMIN D 1,25 DIHYDROXY
Vitamin D 1, 25 (OH)2 Total: 46 pg/mL (ref 18–72)
Vitamin D2 1, 25 (OH)2: <8 pg/mL
Vitamin D3 1, 25 (OH)2: 46 pg/mL

## 2013-04-26 IMAGING — CR DG CHEST 2V
2 series · 2 of 2 positions shown · non-contrast
Comparison: None.

CLINICAL DATA: Preop for right knee arthroplasty

CHEST - 2 VIEW

[view not recorded (1 of 2)]
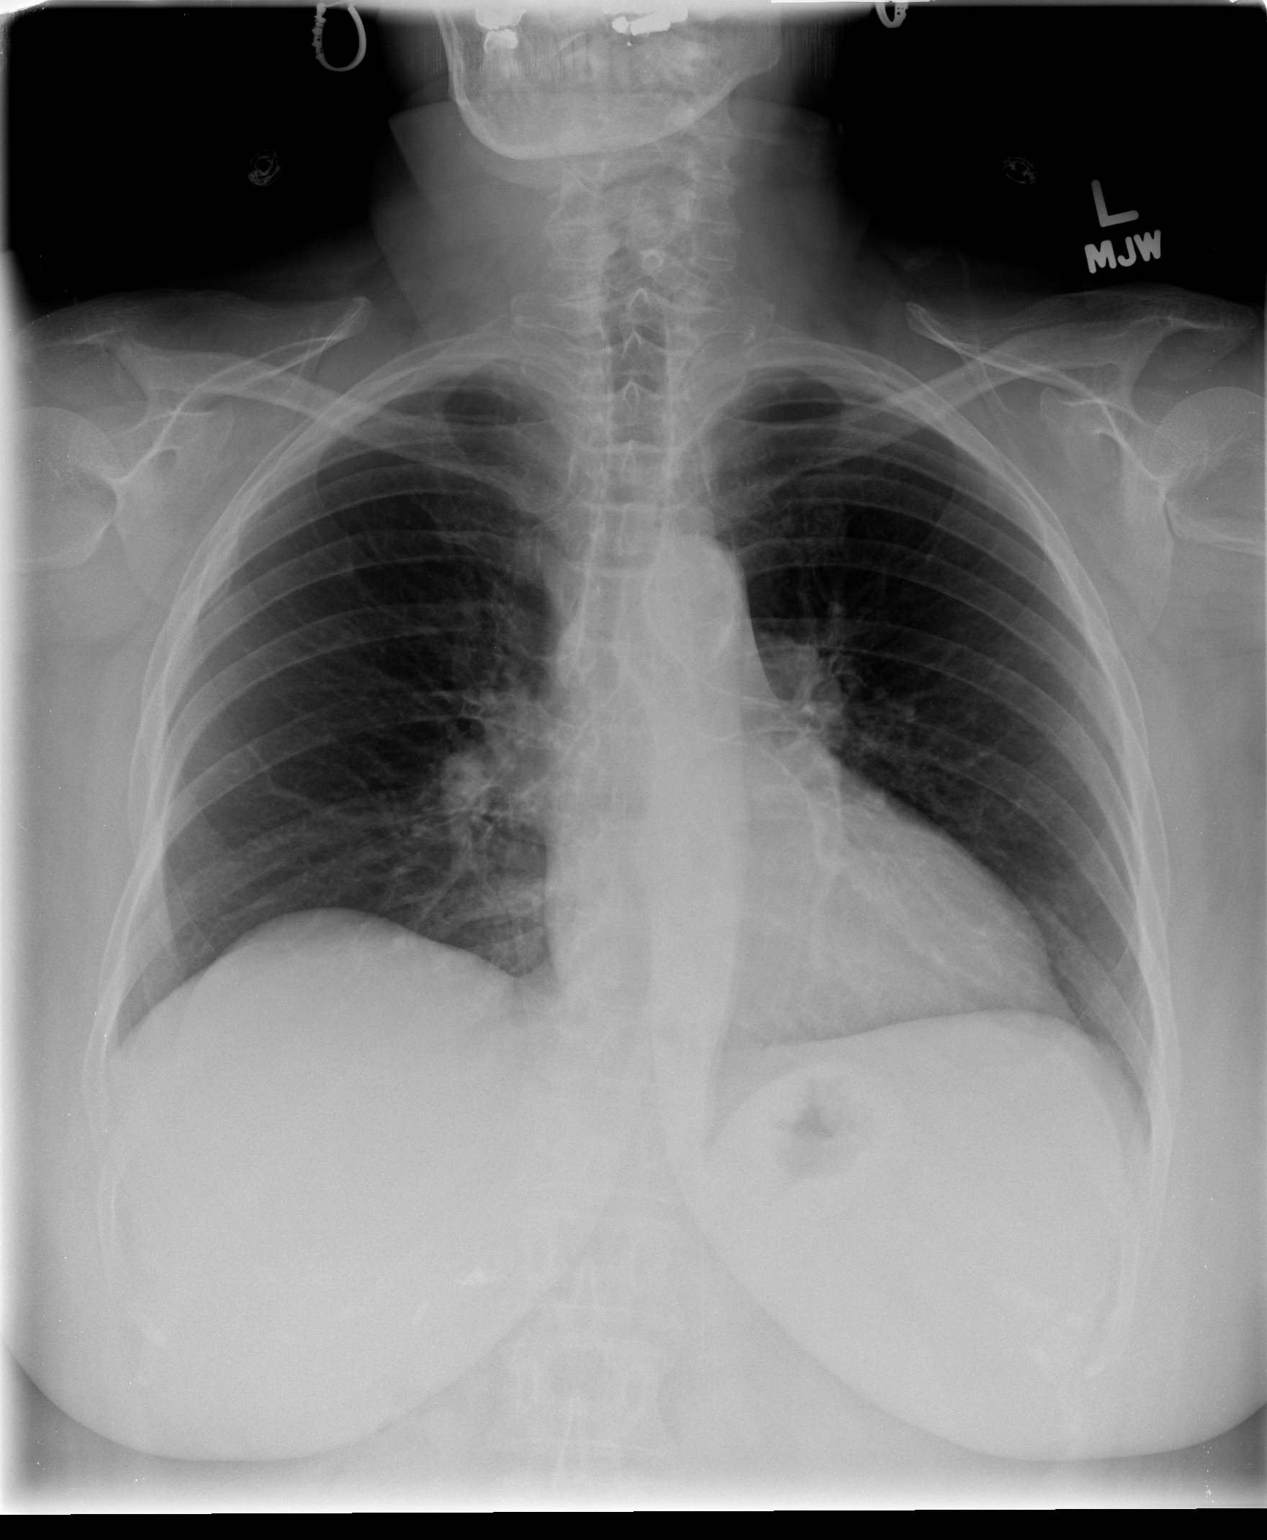

[view not recorded (2 of 2)]
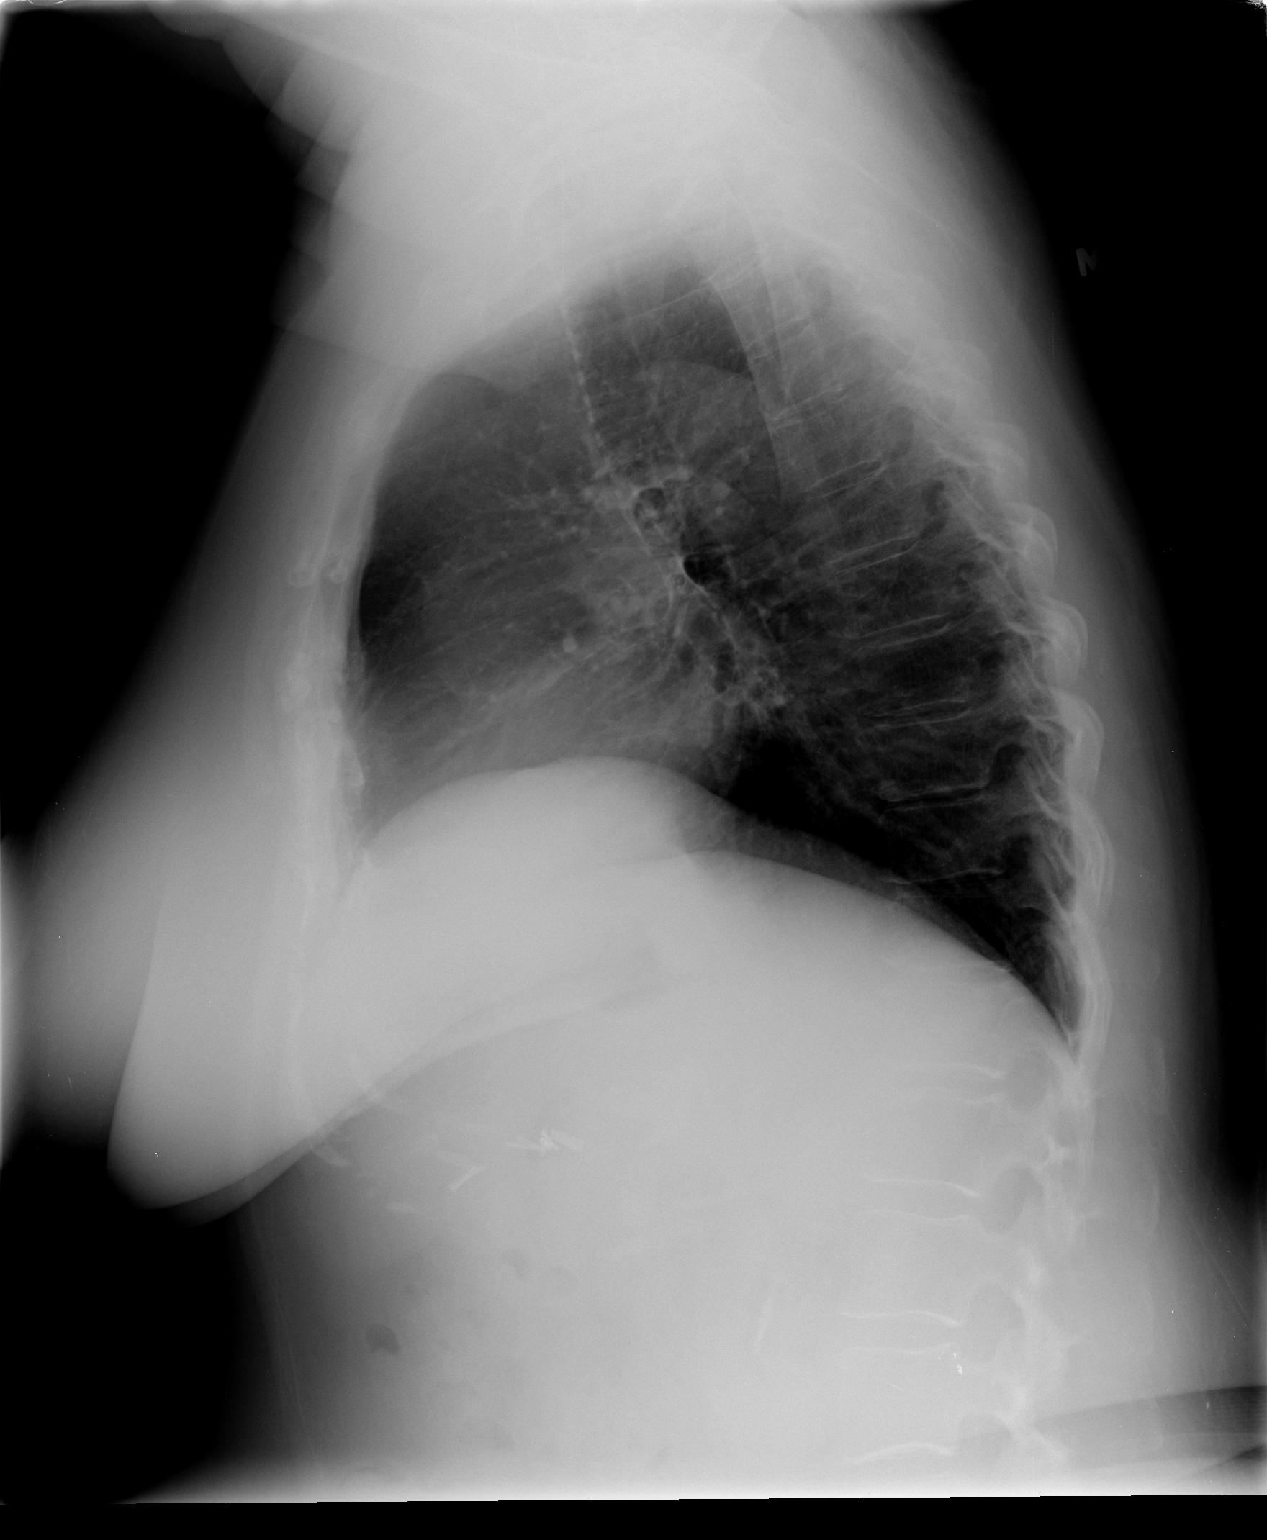

[2 of 2 positions shown; findings below may reference images not displayed]

FINDINGS: Cardiomediastinal silhouette is unremarkable.  No acute
infiltrate or pleural effusion.  No pulmonary edema.  Post
cholecystectomy surgical clips are noted.  Bony thorax is
unremarkable.
IMPRESSION: No active disease.

## 2013-05-04 ENCOUNTER — Telehealth: Payer: Self-pay | Admitting: Internal Medicine

## 2013-05-04 NOTE — Telephone Encounter (Signed)
Relevant patient education mailed to patient.  

## 2013-06-01 ENCOUNTER — Ambulatory Visit (INDEPENDENT_AMBULATORY_CARE_PROVIDER_SITE_OTHER): Payer: Medicare Other | Admitting: Family Medicine

## 2013-06-01 ENCOUNTER — Other Ambulatory Visit: Payer: Self-pay | Admitting: Family Medicine

## 2013-06-01 DIAGNOSIS — Z7901 Long term (current) use of anticoagulants: Secondary | ICD-10-CM

## 2013-06-01 DIAGNOSIS — Z86718 Personal history of other venous thrombosis and embolism: Secondary | ICD-10-CM

## 2013-06-01 LAB — POCT INR: INR: 5

## 2013-06-01 MED ORDER — WARFARIN SODIUM 5 MG PO TABS: ORAL_TABLET | ORAL | Status: DC

## 2013-06-07 ENCOUNTER — Ambulatory Visit (INDEPENDENT_AMBULATORY_CARE_PROVIDER_SITE_OTHER): Payer: Medicare Other | Admitting: General Practice

## 2013-06-07 DIAGNOSIS — Z7901 Long term (current) use of anticoagulants: Secondary | ICD-10-CM

## 2013-06-07 DIAGNOSIS — Z86718 Personal history of other venous thrombosis and embolism: Secondary | ICD-10-CM

## 2013-06-07 DIAGNOSIS — Z5181 Encounter for therapeutic drug level monitoring: Secondary | ICD-10-CM

## 2013-06-07 LAB — POCT INR: INR: 1.7

## 2013-06-07 NOTE — Progress Notes (Signed)
Pre visit review using our clinic review tool, if applicable. No additional management support is needed unless otherwise documented below in the visit note. 

## 2013-06-28 ENCOUNTER — Ambulatory Visit (INDEPENDENT_AMBULATORY_CARE_PROVIDER_SITE_OTHER): Payer: Medicare Other | Admitting: General Practice

## 2013-06-28 DIAGNOSIS — Z7901 Long term (current) use of anticoagulants: Secondary | ICD-10-CM

## 2013-06-28 DIAGNOSIS — Z86718 Personal history of other venous thrombosis and embolism: Secondary | ICD-10-CM

## 2013-06-28 DIAGNOSIS — Z5181 Encounter for therapeutic drug level monitoring: Secondary | ICD-10-CM

## 2013-06-28 LAB — POCT INR: INR: 3.2

## 2013-06-28 NOTE — Progress Notes (Signed)
Pre visit review using our clinic review tool, if applicable. No additional management support is needed unless otherwise documented below in the visit note. 

## 2013-06-30 ENCOUNTER — Other Ambulatory Visit: Payer: Self-pay

## 2013-06-30 MED ORDER — PANTOPRAZOLE SODIUM 40 MG PO TBEC: DELAYED_RELEASE_TABLET | ORAL | Status: DC

## 2013-07-26 ENCOUNTER — Ambulatory Visit (INDEPENDENT_AMBULATORY_CARE_PROVIDER_SITE_OTHER): Payer: Medicare Other | Admitting: General Practice

## 2013-07-26 ENCOUNTER — Telehealth: Payer: Self-pay

## 2013-07-26 DIAGNOSIS — Z86718 Personal history of other venous thrombosis and embolism: Secondary | ICD-10-CM

## 2013-07-26 DIAGNOSIS — Z5181 Encounter for therapeutic drug level monitoring: Secondary | ICD-10-CM

## 2013-07-26 DIAGNOSIS — Z7901 Long term (current) use of anticoagulants: Secondary | ICD-10-CM

## 2013-07-26 LAB — POCT INR: INR: 2.7

## 2013-07-26 NOTE — Progress Notes (Signed)
Pre visit review using our clinic review tool, if applicable. No additional management support is needed unless otherwise documented below in the visit note. 

## 2013-08-03 NOTE — Telephone Encounter (Signed)
Error

## 2013-08-24 ENCOUNTER — Ambulatory Visit (INDEPENDENT_AMBULATORY_CARE_PROVIDER_SITE_OTHER): Payer: Medicare Other | Admitting: Family Medicine

## 2013-08-24 DIAGNOSIS — Z7901 Long term (current) use of anticoagulants: Secondary | ICD-10-CM

## 2013-08-24 DIAGNOSIS — Z5181 Encounter for therapeutic drug level monitoring: Secondary | ICD-10-CM

## 2013-08-24 DIAGNOSIS — Z86718 Personal history of other venous thrombosis and embolism: Secondary | ICD-10-CM

## 2013-08-24 LAB — POCT INR: INR: 4.1

## 2013-08-25 ENCOUNTER — Other Ambulatory Visit: Payer: Self-pay

## 2013-08-25 MED ORDER — BENAZEPRIL HCL 10 MG PO TABS: ORAL_TABLET | ORAL | Status: DC

## 2013-08-25 MED ORDER — AMLODIPINE BESYLATE 5 MG PO TABS: ORAL_TABLET | ORAL | Status: DC

## 2013-09-13 ENCOUNTER — Other Ambulatory Visit: Payer: Self-pay

## 2013-09-13 MED ORDER — SIMVASTATIN 10 MG PO TABS: 10.0000 mg | ORAL_TABLET | Freq: Every day | ORAL | Status: DC

## 2013-09-14 ENCOUNTER — Ambulatory Visit (INDEPENDENT_AMBULATORY_CARE_PROVIDER_SITE_OTHER): Payer: Medicare Other | Admitting: General Practice

## 2013-09-14 DIAGNOSIS — Z86718 Personal history of other venous thrombosis and embolism: Secondary | ICD-10-CM

## 2013-09-14 DIAGNOSIS — Z5181 Encounter for therapeutic drug level monitoring: Secondary | ICD-10-CM

## 2013-09-14 DIAGNOSIS — Z7901 Long term (current) use of anticoagulants: Secondary | ICD-10-CM

## 2013-09-14 LAB — POCT INR: INR: 3.9

## 2013-09-14 NOTE — Progress Notes (Signed)
Pre visit review using our clinic review tool, if applicable. No additional management support is needed unless otherwise documented below in the visit note. 

## 2013-09-19 ENCOUNTER — Other Ambulatory Visit: Payer: Self-pay | Admitting: Family Medicine

## 2013-09-19 DIAGNOSIS — Z7901 Long term (current) use of anticoagulants: Secondary | ICD-10-CM

## 2013-09-19 MED ORDER — WARFARIN SODIUM 5 MG PO TABS: ORAL_TABLET | ORAL | Status: DC

## 2013-09-27 ENCOUNTER — Other Ambulatory Visit: Payer: Self-pay

## 2013-09-27 ENCOUNTER — Telehealth: Payer: Self-pay

## 2013-09-27 MED ORDER — METOPROLOL TARTRATE 50 MG PO TABS: ORAL_TABLET | ORAL | Status: DC

## 2013-09-27 NOTE — Telephone Encounter (Signed)
Received a fax back from [facility] stating prior authorization is not required for Metoprolol. Pharmacy can call the help desk

## 2013-09-27 NOTE — Telephone Encounter (Signed)
Prior authorization form for Metoprolol has been faxed back to [facility]

## 2013-10-05 ENCOUNTER — Ambulatory Visit (INDEPENDENT_AMBULATORY_CARE_PROVIDER_SITE_OTHER): Payer: Medicare Other | Admitting: General Practice

## 2013-10-05 DIAGNOSIS — Z7901 Long term (current) use of anticoagulants: Secondary | ICD-10-CM

## 2013-10-05 DIAGNOSIS — Z86718 Personal history of other venous thrombosis and embolism: Secondary | ICD-10-CM

## 2013-10-05 DIAGNOSIS — Z5181 Encounter for therapeutic drug level monitoring: Secondary | ICD-10-CM

## 2013-10-05 LAB — POCT INR: INR: 3.9

## 2013-10-05 NOTE — Progress Notes (Signed)
Pre visit review using our clinic review tool, if applicable. No additional management support is needed unless otherwise documented below in the visit note. 

## 2013-10-26 ENCOUNTER — Ambulatory Visit (INDEPENDENT_AMBULATORY_CARE_PROVIDER_SITE_OTHER): Payer: Medicare Other | Admitting: General Practice

## 2013-10-26 DIAGNOSIS — Z5181 Encounter for therapeutic drug level monitoring: Secondary | ICD-10-CM

## 2013-10-26 DIAGNOSIS — Z86718 Personal history of other venous thrombosis and embolism: Secondary | ICD-10-CM

## 2013-10-26 DIAGNOSIS — Z7901 Long term (current) use of anticoagulants: Secondary | ICD-10-CM

## 2013-10-26 LAB — POCT INR: INR: 2.7

## 2013-10-26 NOTE — Progress Notes (Signed)
Pre visit review using our clinic review tool, if applicable. No additional management support is needed unless otherwise documented below in the visit note. 

## 2013-11-23 ENCOUNTER — Ambulatory Visit (INDEPENDENT_AMBULATORY_CARE_PROVIDER_SITE_OTHER): Payer: Medicare Other | Admitting: *Deleted

## 2013-11-23 DIAGNOSIS — Z7901 Long term (current) use of anticoagulants: Secondary | ICD-10-CM

## 2013-11-23 DIAGNOSIS — Z5181 Encounter for therapeutic drug level monitoring: Secondary | ICD-10-CM

## 2013-11-23 DIAGNOSIS — Z86718 Personal history of other venous thrombosis and embolism: Secondary | ICD-10-CM

## 2013-11-23 LAB — POCT INR: INR: 2.8

## 2013-12-07 ENCOUNTER — Ambulatory Visit (INDEPENDENT_AMBULATORY_CARE_PROVIDER_SITE_OTHER): Payer: Medicare Other | Admitting: Internal Medicine

## 2013-12-07 ENCOUNTER — Encounter: Payer: Self-pay | Admitting: Internal Medicine

## 2013-12-07 VITALS — BP 130/80 | HR 76 | Temp 98.2°F | Wt 177.0 lb

## 2013-12-07 DIAGNOSIS — E782 Mixed hyperlipidemia: Secondary | ICD-10-CM

## 2013-12-07 DIAGNOSIS — K219 Gastro-esophageal reflux disease without esophagitis: Secondary | ICD-10-CM

## 2013-12-07 DIAGNOSIS — R7309 Other abnormal glucose: Secondary | ICD-10-CM

## 2013-12-07 DIAGNOSIS — Z86718 Personal history of other venous thrombosis and embolism: Secondary | ICD-10-CM

## 2013-12-07 DIAGNOSIS — M1710 Unilateral primary osteoarthritis, unspecified knee: Secondary | ICD-10-CM

## 2013-12-07 DIAGNOSIS — M1712 Unilateral primary osteoarthritis, left knee: Secondary | ICD-10-CM | POA: Insufficient documentation

## 2013-12-07 NOTE — Assessment & Plan Note (Signed)
A1c in non diabetic range

## 2013-12-07 NOTE — Assessment & Plan Note (Signed)
Symptomatic & end stage with pain & decreased ROM Cleared for surgery

## 2013-12-07 NOTE — Assessment & Plan Note (Signed)
Asymptomatic; no change

## 2013-12-07 NOTE — Assessment & Plan Note (Signed)
Lipids @ goal; recheck 01/16

## 2013-12-07 NOTE — Patient Instructions (Signed)
Preop clearance note will be sent to Dr Noemi Chapel.You are medically cleared for surgery.

## 2013-12-07 NOTE — Progress Notes (Signed)
Subjective:    Patient ID: Bonnie Zhang, female    DOB: 29-Aug-1940, 73 y.o.   MRN: [id number]  HPI   She is tentatively scheduled for left total knee replacement 01/02/14 by Dr Noemi Chapel; she is here for preoperative clearance.  Of concern are  her diagnoses of hypertension, dyslipidemia, and esophageal reflux. 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 Her most recent PT/INR was 2.8 on 11/23/13. Transition to another anticoagulant will be necessary perioperatively.  She is compliant with her medications as listed and has no adverse effects.  Her most recent labs on January of this year. Glucose was minimally elevated at 101; but her A1c was 5.3%. Hepatorenal function and lipids were normal. TSH was therapeutic. She exhibited no anemia.  BP @ home averages 130/80 @ home.  Chronic LLE edema.     Review of Systems  All below NEGATIVE: Chest pain, palpitations       Dyspnea Claudication  Lightheadedness,Syncope Weight change Polyuria/phagia/dipsia    Blurred vision /diplopia/lossof vision Limb numbness/tingling/burning Non healing skin lesions Abd pain, dysphagia,bowel changes ,melena, rectal bleed  Myalgias      Objective:   Physical Exam   Positive or pertinent findings include: She has fusiform changes of the knees with crepitus on the right. She resists range of motion testing on the left due to pain. Deep tendon reflexes at the knees are 1/2+.  As per CDC Guidelines ,Epic documents obesity as being present . Trace LLE edema.   Gen.: Healthy and well-nourished in appearance. Alert, appropriate and cooperative throughout exam. Appears younger than stated age  Head: Normocephalic without obvious abnormalities Eyes: No corneal or conjunctival inflammation noted. Pupils equal round reactive to light and accommodation. Extraocular motion intact.  Ears: External  ear exam reveals no significant lesions or  deformities. Nose: External nasal exam reveals no deformity or inflammation. Nasal mucosa are pink and moist. No lesions or exudates noted.   Mouth: Oral mucosa and oropharynx reveal no lesions or exudates. Teeth in good repair. Neck: No deformities, masses, or tenderness noted. Range of motion decreeased. Thyroid normal. Lungs: Normal respiratory effort; chest expands symmetrically. Lungs are clear to auscultation without rales, wheezes, or increased work of breathing. Heart: Normal rate and rhythm. Normal S1 and S2. No gallop, click, or rub. No murmur. Abdomen: Bowel sounds normal; abdomen soft and nontender. No masses, organomegaly or hernias noted.                             Musculoskeletal/extremities: Accentuated curvature of  mid thoracic spine. No clubbing, cyanosis, or significant extremity  deformity noted. Range of motion normal .Tone & strength normal. Hand joints normal Fingernail health good. Able to lie down & sit up w/o help. Negative SLR bilaterally Vascular: Carotid, radial artery, dorsalis pedis and  posterior tibial pulses are full and equal. No bruits present. Neurologic: Alert and oriented x3. Deep tendon reflexes symmetric.  Gait slightly rocking side to side.     Skin: Intact without suspicious lesions or rashes. Lymph: No cervical, axillary lymphadenopathy present. Psych: Mood and affect are normal. Normally interactive  Assessment & Plan:   See Current Assessment & Plan in Problem List under specific Diagnosis

## 2013-12-07 NOTE — Assessment & Plan Note (Signed)
Perioperative anticoagulation as per Orthopedic protocol; now on warfarin

## 2013-12-07 NOTE — Progress Notes (Signed)
Pre visit review using our clinic review tool, if applicable. No additional management support is needed unless otherwise documented below in the visit note. 

## 2013-12-20 ENCOUNTER — Ambulatory Visit (INDEPENDENT_AMBULATORY_CARE_PROVIDER_SITE_OTHER): Payer: Medicare Other | Admitting: *Deleted

## 2013-12-20 DIAGNOSIS — Z5181 Encounter for therapeutic drug level monitoring: Secondary | ICD-10-CM

## 2013-12-20 DIAGNOSIS — Z86718 Personal history of other venous thrombosis and embolism: Secondary | ICD-10-CM

## 2013-12-20 DIAGNOSIS — Z7901 Long term (current) use of anticoagulants: Secondary | ICD-10-CM

## 2013-12-20 LAB — POCT INR: INR: 2.6

## 2013-12-21 ENCOUNTER — Telehealth: Payer: Self-pay | Admitting: *Deleted

## 2013-12-21 ENCOUNTER — Other Ambulatory Visit: Payer: Self-pay | Admitting: *Deleted

## 2013-12-21 ENCOUNTER — Encounter (HOSPITAL_COMMUNITY): Payer: Self-pay | Admitting: Physician Assistant

## 2013-12-21 DIAGNOSIS — M1712 Unilateral primary osteoarthritis, left knee: Secondary | ICD-10-CM | POA: Diagnosis present

## 2013-12-21 NOTE — Telephone Encounter (Signed)
Pt was called to discuss lovenox bridge before procedure on 10/5. Pt lovenox prescription was called into pharmacy. Pt verbalizes understanding of lovenox bridge and understanding pf following schedule (per coumadin clinic standard form) 9/30 take last dose of coumadin 10/1 no coumadin no lovenox 10/2 no coumadin, take first lovenox injection 10/3 no coumadin, take lovenox injection 10/4 no coumadin, take lovenox injection 10/5 procedure day- no coumadin or lovenox   Lovenox dosing was discussed with Elberta Leatherwood
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
## 2013-12-21 NOTE — Telephone Encounter (Signed)
Pt called to inform coumadin clinic that she will be needing lovenox injections prior to surgery.

## 2013-12-22 ENCOUNTER — Encounter (HOSPITAL_COMMUNITY): Payer: Self-pay | Admitting: Pharmacy Technician

## 2013-12-23 ENCOUNTER — Encounter (HOSPITAL_COMMUNITY): Payer: Self-pay

## 2013-12-23 ENCOUNTER — Encounter (HOSPITAL_COMMUNITY)
Admission: RE | Admit: 2013-12-23 | Discharge: 2013-12-23 | Disposition: A | Discharge status: Home / Self Care | Payer: Medicare Other | Source: Ambulatory Visit | Attending: Orthopedic Surgery | Admitting: Orthopedic Surgery

## 2013-12-23 ENCOUNTER — Ambulatory Visit (HOSPITAL_COMMUNITY)
Admission: RE | Admit: 2013-12-23 | Discharge: 2013-12-23 | Disposition: A | Discharge status: Home / Self Care | Payer: Medicare Other | Source: Ambulatory Visit | Attending: Physician Assistant | Admitting: Physician Assistant

## 2013-12-23 DIAGNOSIS — Z01818 Encounter for other preprocedural examination: Secondary | ICD-10-CM | POA: Diagnosis not present

## 2013-12-23 HISTORY — DX: Personal history of colonic polyps: Z86.010

## 2013-12-23 HISTORY — DX: Long term (current) use of anticoagulants: Z79.01

## 2013-12-23 LAB — COMPREHENSIVE METABOLIC PANEL
ALT: 11 U/L (ref 0–35)
AST: 19 U/L (ref 0–37)
Albumin: 4.2 g/dL (ref 3.5–5.2)
Alkaline Phosphatase: 110 U/L (ref 39–117)
Anion gap: 13 (ref 5–15)
BUN: 8 mg/dL (ref 6–23)
CO2: 28 mEq/L (ref 19–32)
Calcium: 9.7 mg/dL (ref 8.4–10.5)
Chloride: 100 mEq/L (ref 96–112)
Creatinine, Ser: 0.8 mg/dL (ref 0.50–1.10)
GFR calc Af Amer: 83 mL/min — ABNORMAL LOW (ref >90)
GFR calc non Af Amer: 72 mL/min — ABNORMAL LOW (ref >90)
Glucose, Bld: 104 mg/dL — ABNORMAL HIGH (ref 70–99)
Potassium: 4 mEq/L (ref 3.7–5.3)
Sodium: 141 mEq/L (ref 137–147)
Total Bilirubin: 0.7 mg/dL (ref 0.3–1.2)
Total Protein: 8.2 g/dL (ref 6.0–8.3)

## 2013-12-23 LAB — URINALYSIS, ROUTINE W REFLEX MICROSCOPIC
Glucose, UA: NEGATIVE mg/dL
Ketones, ur: 15 mg/dL — AB
Nitrite: NEGATIVE
Protein, ur: NEGATIVE mg/dL
Specific Gravity, Urine: 1.028 (ref 1.005–1.030)
Urobilinogen, UA: 0.2 mg/dL (ref 0.0–1.0)
pH: 5 (ref 5.0–8.0)

## 2013-12-23 LAB — TYPE AND SCREEN
ABO/RH(D): A POS
Antibody Screen: NEGATIVE

## 2013-12-23 LAB — CBC WITH DIFFERENTIAL/PLATELET
Basophils Absolute: 0 10*3/uL (ref 0.0–0.1)
Basophils Relative: 0 % (ref 0–1)
Eosinophils Absolute: 0.1 10*3/uL (ref 0.0–0.7)
Eosinophils Relative: 1 % (ref 0–5)
HCT: 43.7 % (ref 36.0–46.0)
Hemoglobin: 14.8 g/dL (ref 12.0–15.0)
Lymphocytes Relative: 32 % (ref 12–46)
Lymphs Abs: 2.2 10*3/uL (ref 0.7–4.0)
MCH: 29.4 pg (ref 26.0–34.0)
MCHC: 33.9 g/dL (ref 30.0–36.0)
MCV: 86.7 fL (ref 78.0–100.0)
Monocytes Absolute: 0.5 10*3/uL (ref 0.1–1.0)
Monocytes Relative: 7 % (ref 3–12)
Neutro Abs: 4.3 10*3/uL (ref 1.7–7.7)
Neutrophils Relative %: 60 % (ref 43–77)
Platelets: 208 10*3/uL (ref 150–400)
RBC: 5.04 MIL/uL (ref 3.87–5.11)
RDW: 13.3 % (ref 11.5–15.5)
WBC: 7.1 10*3/uL (ref 4.0–10.5)

## 2013-12-23 LAB — URINE MICROSCOPIC-ADD ON

## 2013-12-23 LAB — SURGICAL PCR SCREEN
MRSA, PCR: NEGATIVE
Staphylococcus aureus: NEGATIVE

## 2013-12-23 NOTE — Pre-Procedure Instructions (Signed)
Bonnie Zhang  12/23/2013   Your procedure is scheduled on:  Monday, October 5  Report to [facility] Admitting at 7:40 AM.  Call this number if you have problems the morning of surgery: [phone]   Remember:   Do not eat food or drink liquids after midnight. Sunday night   Take these medicines the morning of surgery with A SIP OF WATER: none   Do not wear jewelry, make-up or nail polish.  Do not wear lotions, powders, or perfumes. You may not wear deodorant.  Do not shave 48 hours prior to surgery. Men may shave face and neck.  Do not bring valuables to the hospital.  [facility] is not responsible    for any belongings or valuables.               Contacts, dentures or bridgework may not be worn into surgery.  Leave suitcase in the car. After surgery it may be brought to your room.  For patients admitted to the hospital, discharge time is determined by your   treatment team.                 Special Instructions: [facility] - Preparing for Surgery  Before surgery, you can play an important role.  Because skin is not sterile, your skin needs to be as free of germs as possible.  You can reduce the number of germs on you skin by washing with CHG (chlorahexidine gluconate) soap before surgery.  CHG is an antiseptic cleaner which kills germs and bonds with the skin to continue killing germs even after washing.  Please DO NOT use if you have an allergy to CHG or antibacterial soaps.  If your skin becomes reddened/irritated stop using the CHG and inform your nurse when you arrive at Short Stay.  Do not shave (including legs and underarms) for at least 48 hours prior to the first CHG shower.  You may shave your face.  Please follow these instructions carefully:   1.  Shower with CHG Soap the night before surgery and the      morning of Surgery.  2.  If you choose to wash your hair, wash your hair first as usual with your   normal shampoo.  3.  After you shampoo, rinse  your hair and body thoroughly to remove the    Shampoo.  4.  Use CHG as you would any other liquid soap.  You can apply chg directly    to the skin and wash gently with scrungie or a clean washcloth.  5.  Apply the CHG Soap to your body ONLY FROM THE NECK DOWN.        Do not use on open wounds or open sores.  Avoid contact with your eyes,   ears, mouth and genitals (private parts).  Wash genitals (private parts)   with your normal soap.  6.  Wash thoroughly, paying special attention to the area where your surgery   will be performed.  7.  Thoroughly rinse your body with warm water from the neck down.  8.  DO NOT shower/wash with your normal soap after using and rinsing off   the CHG Soap.  9.  Pat yourself dry with a clean towel.            10.  Wear clean pajamas.            11 .  Place clean sheets on your bed the night of your first  shower and do not  sleep with pets.  Day of Surgery  Do not apply any lotions/deoderants the morning of surgery.  Please wear clean clothes to the hospital/surgery center.     Please read over the following fact sheets that you were given: Pain Booklet, Coughing and Deep Breathing, Blood Transfusion Information and Surgical Site Infection Prevention

## 2013-12-24 LAB — URINE CULTURE: Colony Count: >=100000

## 2013-12-26 ENCOUNTER — Encounter (HOSPITAL_COMMUNITY): Payer: Self-pay | Admitting: Vascular Surgery
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
## 2013-12-30 NOTE — Progress Notes (Signed)
Pt called due to time change. Pt states that Dr Archie Endo office called and they have cancelled her surgery until she sees a cardiologist.

## 2014-01-02 ENCOUNTER — Inpatient Hospital Stay (HOSPITAL_COMMUNITY): Admission: RE | Admit: 2014-01-02 | Payer: Medicare Other | Source: Ambulatory Visit | Admitting: Orthopedic Surgery

## 2014-01-02 ENCOUNTER — Telehealth: Payer: Self-pay | Admitting: Internal Medicine

## 2014-01-02 ENCOUNTER — Encounter (HOSPITAL_COMMUNITY): Admission: RE | Payer: Self-pay | Source: Ambulatory Visit

## 2014-01-02 HISTORY — DX: Unilateral primary osteoarthritis, left knee: M17.12

## 2014-01-02 SURGERY — ARTHROPLASTY, KNEE, TOTAL
Anesthesia: General | Laterality: Left

## 2014-01-02 NOTE — Telephone Encounter (Signed)
Patient was wanting to discuss EKG results and referral to Cardiology.  She also wanted to let you know about a bladder infection that Raliegh Ip treated.

## 2014-01-02 NOTE — Telephone Encounter (Signed)
Phone call to patient. She states she was to have knee surgery today but it has been cancelled. EKG done during pre op was different than the previous. Also patient was shown to have a urinary tract infection. She has a few more days of her antibiotic. She is wanting to speak to you specifically about the EKG.

## 2014-01-02 NOTE — Telephone Encounter (Signed)
Patient has been advised

## 2014-01-02 NOTE — Telephone Encounter (Signed)
If Cardiology appointment is not within the next week; she should come in for office visit

## 2014-01-04 ENCOUNTER — Ambulatory Visit (INDEPENDENT_AMBULATORY_CARE_PROVIDER_SITE_OTHER): Payer: Medicare Other | Admitting: *Deleted

## 2014-01-04 DIAGNOSIS — Z7901 Long term (current) use of anticoagulants: Secondary | ICD-10-CM

## 2014-01-04 DIAGNOSIS — Z5181 Encounter for therapeutic drug level monitoring: Secondary | ICD-10-CM

## 2014-01-04 DIAGNOSIS — Z86718 Personal history of other venous thrombosis and embolism: Secondary | ICD-10-CM

## 2014-01-04 LAB — POCT INR: INR: 2.5

## 2014-01-13 ENCOUNTER — Encounter: Payer: Self-pay | Admitting: Gastroenterology

## 2014-01-13 ENCOUNTER — Encounter: Payer: Self-pay | Admitting: Cardiovascular Disease

## 2014-01-23 ENCOUNTER — Other Ambulatory Visit: Payer: Self-pay | Admitting: *Deleted

## 2014-01-23 MED ORDER — WARFARIN SODIUM 5 MG PO TABS: 2.5000 mg | ORAL_TABLET | Freq: Every day | ORAL | Status: DC

## 2014-01-23 NOTE — Progress Notes (Signed)
Patient ID: Bonnie Zhang, female   DOB: June 26, 1940, 73 y.o.   MRN: [id number]   73 yo referred for cardiac clearance.  Surgery with Dr Noemi Chapel left TKR cancelled 01/02/14 for UTI and change in ECG.  She had a normal myovue study in 2002 CRF  Elevated BP and lipids on good Rx.  She had her right knee done a few years ago without complication.  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  One was 50 years ago after GYN surgery and one was 2001 ? Related to prolonged care travel  She is unaware of any diagnosis of hypercoagulable state.  She denies SSCP has chronic exertional dyspnea.  No palpitations or syncope  No recent LE swelling  CRF HtN and elevated lipids on good Rx  Anticoagulation followed by Dr Linna Darner     ROS: Denies fever, malais, weight loss, blurry vision, decreased visual acuity, cough, sputum, SOB, hemoptysis, pleuritic pain, palpitaitons, heartburn, abdominal pain, melena, lower extremity edema, claudication, or rash.  All other systems reviewed and negative   General: Affect appropriate Healthy:  appears stated age 20: normal Neck supple with no adenopathy JVP normal no bruits no thyromegaly Lungs clear with no wheezing and good diaphragmatic motion Heart:  S1/S2 no murmur,rub, gallop or click PMI normal Abdomen: benighn, BS positve, no tenderness, no AAA no bruit.  No HSM or HJR Distal pulses intact with no bruits No edema Neuro non-focal Skin warm and dry No muscular weakness  Medications Current Outpatient Prescriptions  Medication Sig Dispense Refill  . amLODipine (NORVASC) 5 MG tablet Take 5 mg by mouth daily.      . benazepril (LOTENSIN) 10 MG tablet Take 10 mg by mouth daily.      . Cholecalciferol 2000 UNITS CAPS Take 2,000 capsules by mouth daily.      . metoprolol (LOPRESSOR) 50 MG tablet Take 25 mg by mouth 1 day or 1 dose.       . pantoprazole (PROTONIX) 40 MG tablet Take 40 mg by mouth daily as needed. Acid reflux      .  simvastatin (ZOCOR) 10 MG tablet Take 1 tablet (10 mg total) by mouth at bedtime.  90 tablet  1  . warfarin (COUMADIN) 5 MG tablet Take 0.5-1 tablets (2.5-5 mg total) by mouth daily. 5mg  Monday, Wed, and Friday, except 2.5 mg on all other days.  90 tablet  1   No current facility-administered medications for this visit.    Allergies Vicodin; Aspirin; Atorvastatin; and Hctz  Family History: Family History  Problem Relation Age of Onset  . Kidney disease Mother     ? hypertensive  . Hypertension Mother   . Deep vein thrombosis Mother     post ankle fracture  . Leukemia Father   . Colon cancer Maternal Grandmother   . Diabetes Other     cousins  . Anesthesia problems Neg Hx   . Hypotension Neg Hx   . Malignant hyperthermia Neg Hx   . Pseudochol deficiency Neg Hx   . Stroke Neg Hx   . Heart disease Neg Hx     Social History: History   Social History  . Marital Status: Divorced    Spouse Name: N/A    Number of Children: N/A  . Years of Education: N/A   Occupational History  . Not on file.   Social History Main Topics  . Smoking status: Never Smoker   . Smokeless tobacco: Never Used  .  Alcohol Use: No  . Drug Use: No  . Sexual Activity: No   Other Topics Concern  . Not on file   Social History Narrative  . No narrative on file    Past Surgical History  Procedure Laterality Date  . Cesarean section  1961/1965/1966    X 3  . Colonoscopy w/ polypectomy  2007    Dr  Sharlett Iles; due? 2014  . Cholecystectomy  1997  . Dilation and curettage of uterus  2012    uterine polyp, Dr Kennon Rounds  . Appendectomy  1965  . Esophagogastroduodenoscopy  1997  . Total knee arthroplasty  08/11/2011    Procedure: TOTAL KNEE ARTHROPLASTY;  Surgeon: Lorn Junes, MD;  Location: [facility];  Service: Orthopedics;  Laterality: Right;  DR Colcord THIS CASE  . Wisdom tooth extraction    . Hysteroscopy w/d&c N/A 08/05/2012    Procedure: DILATATION AND CURETTAGE  /HYSTEROSCOPY;  Surgeon: Donnamae Jude, MD;  Location: [facility];  Service: Gynecology;  Laterality: N/A;  . Breast lumpectomy  1987  . Robotic assisted total hysterectomy with bilateral salpingo oopherectomy Right 09/14/2012    Procedure: ROBOTIC ASSISTED TOTAL HYSTERECTOMY WITH BILATERAL SALPINGO OOPHORECTOMY ,right pelvic LYMPHNODE dissection;  Surgeon: Imagene Gurney A. Alycia Rossetti, MD;  Location: [facility];  Service: Gynecology;  Laterality: Right;    Past Medical History  Diagnosis Date  . High cholesterol     takes Simvastating daily  . Vitamin D deficiency     takes VIt D daily  . DVT (deep vein thrombosis) in pregnancy      1965 post C section;2001 with prolonged driving while on  HRT  . Osteoporosis   . Hypertension     takes Benazepril,Amlodipine,and Metoprolol daily  . Hx of migraines     yrs ago   . Bruises easily     pt is on Coumadin;last dose of Coumadin 08/05/11 and then Lovenox started 08/07/11  . GERD (gastroesophageal reflux disease)   . Hx of colonic polyps     Dr Sharlett Iles  . PONV (postoperative nausea and vomiting)   . PVD (peripheral vascular disease) 1965, 2001    abnormal venous doppler findings due to recurrent DVT'S left leg  . Right knee DJD     knees  . Headache(784.0)     history - years ago, no prob x 40 yrs per pt  . Primary localized osteoarthritis of left knee   . Anticoagulated on Coumadin 2001    2 blood clots  . History of colon polyps     Electrocardiogram:  SR RBBB ? IMI inferior T wave inversion 9/15  On 5/14  SR rate 65 normal with no BBB or ? MI  Assessment and Plan

## 2014-01-24 ENCOUNTER — Ambulatory Visit (INDEPENDENT_AMBULATORY_CARE_PROVIDER_SITE_OTHER): Payer: Medicare Other | Admitting: Cardiovascular Disease

## 2014-01-24 ENCOUNTER — Encounter: Payer: Self-pay | Admitting: Cardiovascular Disease

## 2014-01-24 VITALS — BP 142/68 | HR 84 | Ht 61.0 in | Wt 171.8 lb

## 2014-01-24 DIAGNOSIS — R9431 Abnormal electrocardiogram [ECG] [EKG]: Secondary | ICD-10-CM

## 2014-01-24 DIAGNOSIS — I10 Essential (primary) hypertension: Secondary | ICD-10-CM

## 2014-01-24 DIAGNOSIS — Z01810 Encounter for preprocedural cardiovascular examination: Secondary | ICD-10-CM

## 2014-01-24 DIAGNOSIS — E782 Mixed hyperlipidemia: Secondary | ICD-10-CM

## 2014-01-24 DIAGNOSIS — R7302 Impaired glucose tolerance (oral): Secondary | ICD-10-CM

## 2014-01-24 DIAGNOSIS — Z86718 Personal history of other venous thrombosis and embolism: Secondary | ICD-10-CM

## 2014-01-24 NOTE — Assessment & Plan Note (Signed)
Discussed low carb diet.  Target hemoglobin A1c is 6.5 or less.  Continue current medications.  

## 2014-01-24 NOTE — Assessment & Plan Note (Signed)
Cholesterol is at goal.  Continue current dose of statin and diet Rx.  No myalgias or side effects.  F/U  LFT's in 6 months. Lab Results  Component Value Date   LDLCALC 64 04/14/2013

## 2014-01-24 NOTE — Assessment & Plan Note (Signed)
New RBBB with ? IMI  Multiple CRF;s and activity limited by knee pain.  Exertional dyspnea  F/U lexiscan myovue and echo to r/o CAD and assess EF

## 2014-01-24 NOTE — Assessment & Plan Note (Signed)
Not clear to me why she is on chronic anticoagulation  One DVT decades ago ppt by surgery and one 2001 ppt by multiple long car trips  Will order LE venous duplex for baseline before left TKR and continue until post op and discuss with Dr Linna Darner

## 2014-01-24 NOTE — Assessment & Plan Note (Signed)
Well controlled.  Continue current medications and low sodium Dash type diet.    

## 2014-01-24 NOTE — Patient Instructions (Addendum)
  Your physician wants you to follow-up in: February   2016 WITH  DR Blima Singer will receive a reminder letter in the mail two months in advance. If you don't receive a letter, please call our office to schedule the follow-up appointment.   Your physician recommends that you continue on your current medications as directed. Please refer to the Current Medication list given to you today. Your physician has requested that you have a lexiscan myoview. For further information please visit [email or web address]. Please follow instruction sheet, as given. Your physician has requested that you have an echocardiogram. Echocardiography is a painless test that uses sound waves to create images of your heart. It provides your doctor with information about the size and shape of your heart and how well your heart's chambers and valves are working. This procedure takes approximately one hour. There are no restrictions for this procedure.  Your physician has requested that you have a lower or upper extremity venous duplex. This test is an ultrasound of the veins in the legs or arms. It looks at venous blood flow that carries blood from the heart to the legs or arms. Allow one hour for a Lower Venous exam. Allow thirty minutes for an Upper Venous exam. There are no restrictions or special instructions.  HX OF  DVT

## 2014-01-25 ENCOUNTER — Telehealth: Payer: Self-pay | Admitting: Cardiovascular Disease

## 2014-01-25 NOTE — Telephone Encounter (Signed)
Received request from Nurse fax box, documents faxed for surgical clearance. To: Raliegh Ip  Fax number: [phone] Attention: [phone]

## 2014-02-03 ENCOUNTER — Ambulatory Visit (HOSPITAL_BASED_OUTPATIENT_CLINIC_OR_DEPARTMENT_OTHER): Payer: Medicare Other | Admitting: Cardiology

## 2014-02-03 ENCOUNTER — Ambulatory Visit (HOSPITAL_BASED_OUTPATIENT_CLINIC_OR_DEPARTMENT_OTHER): Payer: Medicare Other | Admitting: *Deleted

## 2014-02-03 ENCOUNTER — Ambulatory Visit (HOSPITAL_COMMUNITY): Payer: Medicare Other | Attending: Internal Medicine | Admitting: Radiology

## 2014-02-03 DIAGNOSIS — I10 Essential (primary) hypertension: Secondary | ICD-10-CM | POA: Insufficient documentation

## 2014-02-03 DIAGNOSIS — R9431 Abnormal electrocardiogram [ECG] [EKG]: Secondary | ICD-10-CM

## 2014-02-03 DIAGNOSIS — Z01810 Encounter for preprocedural cardiovascular examination: Secondary | ICD-10-CM | POA: Diagnosis not present

## 2014-02-03 DIAGNOSIS — Z86718 Personal history of other venous thrombosis and embolism: Secondary | ICD-10-CM

## 2014-02-03 DIAGNOSIS — I4510 Unspecified right bundle-branch block: Secondary | ICD-10-CM | POA: Insufficient documentation

## 2014-02-03 MED ORDER — TECHNETIUM TC 99M SESTAMIBI GENERIC - CARDIOLITE
11.0000 | Freq: Once | INTRAVENOUS | Status: AC | PRN
  Administered 2014-02-03: 11 via INTRAVENOUS

## 2014-02-03 MED ORDER — TECHNETIUM TC 99M SESTAMIBI GENERIC - CARDIOLITE
33.0000 | Freq: Once | INTRAVENOUS | Status: AC | PRN
  Administered 2014-02-03: 33 via INTRAVENOUS

## 2014-02-03 MED ORDER — REGADENOSON 0.4 MG/5ML IV SOLN
0.4000 mg | Freq: Once | INTRAVENOUS | Status: AC
  Administered 2014-02-03: 0.4 mg via INTRAVENOUS

## 2014-02-03 NOTE — Progress Notes (Signed)
Venous Duplex Lower Bilateral Performed 

## 2014-02-03 NOTE — Progress Notes (Signed)
Echo performed. 

## 2014-02-03 NOTE — Progress Notes (Signed)
[facility] [address] [place], [place] [address] [phone]    Cardiology Nuclear Med Study  Bonnie Zhang is a 73 y.o. female     MRN : [id number]     DOB: 08/06/1940  Procedure Date: 02/03/2014  Nuclear Med Background Indication for Stress Test:  Evaluation for Ischemia, Abnormal EKG, and Pending Surgical Clearance for  (L) TKR by Elsie Saas, MD History:  No known CAD, MPI 2002 (normal) EF 75% Cardiac Risk Factors: Hypertension and RBBB  Symptoms:  None indicated   Nuclear Pre-Procedure Caffeine/Decaff Intake:  None> 12 hrs NPO After: 6:00pm   Lungs:  clear O2 Sat: 96% on room air. IV 0.9% NS with Angio Cath:  24g  IV Site: Above R Antecubital, site unremarkable, tolerated well IV Started by:  Irven Baltimore, RN  Chest Size (in):  40 Cup Size: C  Height: 5\' 1"  (1.549 m)  Weight:  172 lb (78.019 kg)  BMI:  Body mass index is 32.52 kg/(m^2). Tech Comments:  No medications today. Patient took Lopressor and Norvasc last night. Irven Baltimore, RN    Nuclear Med Study 1 or 2 day study: 1 day  Stress Test Type:  Carlton Adam  Reading MD: N/A  Order Authorizing Provider:  Jenkins Rouge, MD  Resting Radionuclide: Technetium 47m Sestamibi  Resting Radionuclide Dose: 11.0 mCi   Stress Radionuclide:  Technetium 68m Sestamibi  Stress Radionuclide Dose: 33.0 mCi           Stress Protocol Rest HR: 57 Stress HR: 87  Rest BP: 149/68 Stress BP: 142/57  Exercise Time (min): n/a METS: n/a   Predicted Max HR: 148 bpm % Max HR: 58.78 bpm Rate Pressure Product: 12963   Dose of Adenosine (mg):  n/a Dose of Lexiscan: 0.4 mg  Dose of Atropine (mg): n/a Dose of Dobutamine: n/a mcg/kg/min (at max HR)  Stress Test Technologist: Glade Lloyd, BS-ES  Nuclear Technologist:  Earl Many, CNMT     Rest Procedure:  Myocardial perfusion imaging was performed at rest 45 minutes following the intravenous administration of Technetium 60m Sestamibi. Rest ECG:  NSR-LBBB  Stress Procedure:  The patient received IV Lexiscan 0.4 mg over 15-seconds.  Technetium 57m Sestamibi injected at 30-seconds.  Quantitative spect images were obtained after a 45 minute delay.  During the infusion of Lexiscan the patient complained of SOB, lightheadedness and weakness.  These symptoms began to subside in recovery.  Stress ECG: Insignificant upsloping ST segment depression.  QPS Raw Data Images:  Normal; no motion artifact; normal heart/lung ratio. Stress Images:  Normal homogeneous uptake in all areas of the myocardium. Rest Images:  Normal homogeneous uptake in all areas of the myocardium. Subtraction (SDS):  No evidence of ischemia. Transient Ischemic Dilatation (Normal <1.22):  1.14 Lung/Heart Ratio (Normal <0.45):  0.37  Quantitative Gated Spect Images QGS EDV:  59 ml QGS ESV:  13 ml  Impression Exercise Capacity:  Lexiscan with no exercise. BP Response:  Normal blood pressure response. Clinical Symptoms:  No significant symptoms noted. ECG Impression:  Insignificant upsloping ST segment depression. Comparison with Prior Nuclear Study: No images to compare  Overall Impression:  Normal stress nuclear study.  No evidence of ischemia.     LV Ejection Fraction: 78%.  LV Wall Motion:  NL LV Function; NL Wall Motion.   Thayer Headings, Brooke Bonito., MD, [facility] 02/03/2014, 4:27 PM [address],  [place] Pager [phone]

## 2014-02-14 ENCOUNTER — Ambulatory Visit (INDEPENDENT_AMBULATORY_CARE_PROVIDER_SITE_OTHER): Payer: Medicare Other

## 2014-02-14 DIAGNOSIS — Z5181 Encounter for therapeutic drug level monitoring: Secondary | ICD-10-CM

## 2014-02-14 DIAGNOSIS — Z7901 Long term (current) use of anticoagulants: Secondary | ICD-10-CM

## 2014-02-14 DIAGNOSIS — Z86718 Personal history of other venous thrombosis and embolism: Secondary | ICD-10-CM

## 2014-02-14 LAB — POCT INR: INR: 2.7

## 2014-02-26 ENCOUNTER — Other Ambulatory Visit: Payer: Self-pay | Admitting: Internal Medicine

## 2014-03-05 ENCOUNTER — Other Ambulatory Visit: Payer: Self-pay | Admitting: Internal Medicine

## 2014-03-29 ENCOUNTER — Ambulatory Visit (INDEPENDENT_AMBULATORY_CARE_PROVIDER_SITE_OTHER): Payer: Medicare Other | Admitting: Family Medicine

## 2014-03-29 DIAGNOSIS — Z86718 Personal history of other venous thrombosis and embolism: Secondary | ICD-10-CM

## 2014-03-29 DIAGNOSIS — Z7901 Long term (current) use of anticoagulants: Secondary | ICD-10-CM

## 2014-03-29 DIAGNOSIS — Z5181 Encounter for therapeutic drug level monitoring: Secondary | ICD-10-CM

## 2014-03-29 LAB — POCT INR: INR: 2.6

## 2014-04-19 ENCOUNTER — Encounter: Payer: Medicare Other | Admitting: Internal Medicine

## 2014-05-10 ENCOUNTER — Ambulatory Visit (INDEPENDENT_AMBULATORY_CARE_PROVIDER_SITE_OTHER): Payer: Medicare Other | Admitting: General Practice

## 2014-05-10 DIAGNOSIS — Z5181 Encounter for therapeutic drug level monitoring: Secondary | ICD-10-CM

## 2014-05-10 DIAGNOSIS — Z86718 Personal history of other venous thrombosis and embolism: Secondary | ICD-10-CM

## 2014-05-10 LAB — POCT INR: INR: 2.7

## 2014-05-10 NOTE — Progress Notes (Signed)
Pre visit review using our clinic review tool, if applicable. No additional management support is needed unless otherwise documented below in the visit note. 

## 2014-05-10 NOTE — Progress Notes (Signed)
Agree with plan 

## 2014-05-16 ENCOUNTER — Encounter: Payer: Medicare Other | Admitting: Internal Medicine

## 2014-05-16 ENCOUNTER — Ambulatory Visit: Payer: Self-pay

## 2014-05-25 ENCOUNTER — Ambulatory Visit (INDEPENDENT_AMBULATORY_CARE_PROVIDER_SITE_OTHER): Payer: Medicare Other | Admitting: Internal Medicine

## 2014-05-25 ENCOUNTER — Encounter: Payer: Self-pay | Admitting: Internal Medicine

## 2014-05-25 ENCOUNTER — Other Ambulatory Visit: Payer: Self-pay | Admitting: Internal Medicine

## 2014-05-25 ENCOUNTER — Other Ambulatory Visit (INDEPENDENT_AMBULATORY_CARE_PROVIDER_SITE_OTHER): Payer: Medicare Other

## 2014-05-25 VITALS — BP 160/80 | HR 81 | Temp 97.9°F | Resp 16 | Ht 61.0 in | Wt 177.2 lb

## 2014-05-25 DIAGNOSIS — E782 Mixed hyperlipidemia: Secondary | ICD-10-CM

## 2014-05-25 DIAGNOSIS — I10 Essential (primary) hypertension: Secondary | ICD-10-CM

## 2014-05-25 DIAGNOSIS — M810 Age-related osteoporosis without current pathological fracture: Secondary | ICD-10-CM

## 2014-05-25 DIAGNOSIS — Z86010 Personal history of colonic polyps: Secondary | ICD-10-CM

## 2014-05-25 DIAGNOSIS — M1712 Unilateral primary osteoarthritis, left knee: Secondary | ICD-10-CM

## 2014-05-25 DIAGNOSIS — Z0000 Encounter for general adult medical examination without abnormal findings: Secondary | ICD-10-CM | POA: Diagnosis not present

## 2014-05-25 LAB — CBC WITH DIFFERENTIAL/PLATELET
Basophils Absolute: 0 10*3/uL (ref 0.0–0.1)
Basophils Relative: 0.3 % (ref 0.0–3.0)
Eosinophils Absolute: 0.1 10*3/uL (ref 0.0–0.7)
Eosinophils Relative: 1.2 % (ref 0.0–5.0)
HCT: 45.8 % (ref 36.0–46.0)
Hemoglobin: 15.7 g/dL — ABNORMAL HIGH (ref 12.0–15.0)
Lymphocytes Relative: 34.8 % (ref 12.0–46.0)
Lymphs Abs: 2.2 10*3/uL (ref 0.7–4.0)
MCHC: 34.3 g/dL (ref 30.0–36.0)
MCV: 86.5 fl (ref 78.0–100.0)
Monocytes Absolute: 0.4 10*3/uL (ref 0.1–1.0)
Monocytes Relative: 6.9 % (ref 3.0–12.0)
Neutro Abs: 3.6 10*3/uL (ref 1.4–7.7)
Neutrophils Relative %: 56.8 % (ref 43.0–77.0)
Platelets: 247 10*3/uL (ref 150.0–400.0)
RBC: 5.29 Mil/uL — ABNORMAL HIGH (ref 3.87–5.11)
RDW: 12.4 % (ref 11.5–15.5)
WBC: 6.4 10*3/uL (ref 4.0–10.5)

## 2014-05-25 LAB — BASIC METABOLIC PANEL
BUN: 11 mg/dL (ref 6–23)
CO2: 29 mEq/L (ref 19–32)
Calcium: 10.2 mg/dL (ref 8.4–10.5)
Chloride: 102 mEq/L (ref 96–112)
Creatinine, Ser: 0.9 mg/dL (ref 0.40–1.20)
GFR: 65.28 mL/min (ref >60.00)
Glucose, Bld: 105 mg/dL — ABNORMAL HIGH (ref 70–99)
Potassium: 3.9 mEq/L (ref 3.5–5.1)
Sodium: 140 mEq/L (ref 135–145)

## 2014-05-25 LAB — HEPATIC FUNCTION PANEL
ALT: 14 U/L (ref 0–35)
AST: 20 U/L (ref 0–37)
Albumin: 4.7 g/dL (ref 3.5–5.2)
Alkaline Phosphatase: 106 U/L (ref 39–117)
Bilirubin, Direct: 0.1 mg/dL (ref 0.0–0.3)
Total Bilirubin: 0.7 mg/dL (ref 0.2–1.2)
Total Protein: 8.3 g/dL (ref 6.0–8.3)

## 2014-05-25 LAB — VITAMIN D 25 HYDROXY (VIT D DEFICIENCY, FRACTURES): VITD: 53.41 ng/mL (ref 30.00–100.00)

## 2014-05-25 LAB — TSH: TSH: 1.7 u[IU]/mL (ref 0.35–4.50)

## 2014-05-25 MED ORDER — BENAZEPRIL HCL 20 MG PO TABS
20.0000 mg | ORAL_TABLET | Freq: Every day | ORAL | Status: DC
Start: 2014-05-25 — End: 2014-11-20

## 2014-05-25 MED ORDER — METOPROLOL TARTRATE 25 MG PO TABS: ORAL_TABLET | ORAL | Status: DC

## 2014-05-25 MED ORDER — AMLODIPINE BESYLATE 5 MG PO TABS: 5.0000 mg | ORAL_TABLET | Freq: Every day | ORAL | Status: DC

## 2014-05-25 NOTE — Assessment & Plan Note (Signed)
Vitamin D level BMET

## 2014-05-25 NOTE — Assessment & Plan Note (Signed)
NMR Lipoprofile, hepatic panel,TSH

## 2014-05-25 NOTE — Progress Notes (Signed)
Subjective:    Patient ID: Bonnie Zhang, female    DOB: 1940-08-01, 74 y.o.   MRN: [id number]  HPI Medicare Wellness Visit: Psychosocial and medical history were reviewed as required by Medicare (history related to abuse, antisocial behavior , firearm risk). Social history: Caffeine: 1 cup/day Alcohol:  no Tobacco use:no Exercise:no Personal safety/fall risk:no Limitations of activities of daily living:no Seatbelt/ smoke alarm use:yes Healthcare Power of Attorney/Living Will status and End of Life process assessment : UTD Ophthalmologic exam status: UTD Hearing evaluation status:not UTD Orientation: Oriented X 3 Memory and recall: good Spelling testing: good Depression/anxiety assessment: no Foreign travel history: never Immunization status for influenza/pneumonia/ shingles /tetanus:Shingles & PNA due Transfusion history:no Preventive health care maintenance status: Colonoscopy/BMD/mammogram/Pap as per protocol/standard care:colonscopy overdue (see below) Dental care: Chart reviewed and updated. Active issues reviewed and addressed as documented below.  She is on a modified heart healthy diet; she does not add salt at the table. Over the last 7-10 days her blood pressures has been up without specific trigger. She specifically denies stress or increased salt intake. Other than some flushing she has no associated cardiopulmonary symptoms. She has noted her blood pressure to be in the range of 152/77 at home.  She has been compliant with her blood pressure medicines without adverse effects.  She's been unable to exercise because of degenerative joint disease of the knee. She is scheduled for total knee replacement 07/03/14.  Her last colonoscopy was 2009; a polyp was found. Follow-up in 2014 was recommended but has not been pursued due to her degenerative joint disease of the knees. She denies any active GI symptoms.   Review of Systems  Significant headaches, epistaxis,  chest pain, palpitations, exertional dyspnea, claudication, paroxysmal nocturnal dyspnea, or edema absent. No GI symptoms , memory loss or myalgias  Unexplained weight loss, abdominal pain, significant dyspepsia, dysphagia, melena, rectal bleeding, or persistently small caliber stools are denied.    Objective:   Physical Exam Gen.: Adequately nourished in appearance. Alert, appropriate and cooperative throughout exam. BMI:33.51 Appears younger than stated age  Head: Normocephalic without obvious abnormalities  Eyes: No corneal or conjunctival inflammation noted. Pupils equal round reactive to light and accommodation. Extraocular motion intact.  Ears: External  ear exam reveals no significant lesions or deformities. Canals clear .TMs normal. Hearing is grossly normal bilaterally. Nose: External nasal exam reveals no deformity or inflammation. Nasal mucosa are pink and moist. No lesions or exudates noted.   Mouth: Oral mucosa and oropharynx reveal no lesions or exudates. Teeth in good repair. Neck: No deformities, masses, or tenderness noted. Range of motion & Thyroid normal. Lungs: Normal respiratory effort; chest expands symmetrically. Lungs are clear to auscultation without rales, wheezes, or increased work of breathing. Heart: Normal rate and rhythm. Normal S1 and S2. No gallop, click, or rub. No murmur. Abdomen: Bowel sounds normal; abdomen soft and nontender. No masses, organomegaly or hernias noted. Genitalia: as per Gyn                                  Musculoskeletal/extremities: No deformity or scoliosis noted of  the thoracic or lumbar spine.  No clubbing, cyanosis, edema, or significant extremity  deformity noted.  Range of motion decreased @ knees. Tone & strength normal. Hand joints normal Fingernail  health good. Crepitus & fusiform changes L  knee  Able to lie down & sit up w/o help.  Negative SLR  bilaterally Vascular: Carotid, radial artery, dorsalis pedis and  posterior  tibial pulses are full and equal. No bruits present. Neurologic: Alert and oriented x3. Deep tendon reflexes symmetrical and normal.  Gait : slight side to side limp; uses cane   Skin: Intact without suspicious lesions or rashes. Lymph: No cervical, axillary lymphadenopathy present. Psych: Mood and affect are normal. Normally interactive                                                                                       Assessment & Plan:  See Current Assessment & Plan in Problem List under specific DiagnosisThe labs will be reviewed and risks and options assessed. Written recommendations will be provided by mail or directly through My Chart.Further evaluation or change in medical therapy will be directed by those results.

## 2014-05-25 NOTE — Patient Instructions (Signed)
Minimal Blood Pressure Goal= AVERAGE < 140/90;  Ideal is an AVERAGE < 135/85. This AVERAGE should be calculated from @ least 5-7 BP readings taken @ different times of day on different days of week. You should not respond to isolated BP readings , but rather the AVERAGE for that week .Please bring your  blood pressure cuff to office visits to verify that it is reliable.It  can also be checked against the blood pressure device at the pharmacy. Finger or wrist cuffs are not dependable; an arm cuff is. Your next office appointment will be determined based upon review of your pending labs.  Those instructions will be transmitted to you by mail. Critical values will be called. Followup as needed for any active or acute issue. Please report any significant change in your symptoms. 

## 2014-05-25 NOTE — Assessment & Plan Note (Signed)
CBC GI referral

## 2014-05-25 NOTE — Progress Notes (Signed)
Pre visit review using our clinic review tool, if applicable. No additional management support is needed unless otherwise documented below in the visit note. 

## 2014-05-26 NOTE — Assessment & Plan Note (Signed)
Blood pressure goals reviewed. BMET Increase ACE-I dose

## 2014-05-26 NOTE — Assessment & Plan Note (Signed)
Cleared for surgery 

## 2014-05-27 LAB — NMR LIPOPROFILE WITH LIPIDS
Cholesterol, Total: 178 mg/dL (ref 100–199)
HDL Particle Number: 41.8 umol/L (ref >=30.5)
HDL Size: 9.5 nm (ref >=9.2)
HDL-C: 76 mg/dL (ref >39)
LDL (calc): 74 mg/dL (ref 0–99)
LDL Particle Number: 941 nmol/L (ref <1000)
LDL Size: 20.8 nm (ref >=20.8)
LP-IR Score: 44 (ref <=45)
Large HDL-P: 10.3 umol/L (ref >=4.8)
Large VLDL-P: 4.6 nmol/L — ABNORMAL HIGH (ref <=2.7)
Small LDL Particle Number: 452 nmol/L (ref <=527)
Triglycerides: 138 mg/dL (ref 0–149)
VLDL Size: 48.8 nm — ABNORMAL HIGH (ref <=46.6)

## 2014-05-29 ENCOUNTER — Telehealth: Payer: Self-pay

## 2014-05-29 ENCOUNTER — Other Ambulatory Visit (INDEPENDENT_AMBULATORY_CARE_PROVIDER_SITE_OTHER): Payer: Medicare Other

## 2014-05-29 DIAGNOSIS — R7309 Other abnormal glucose: Secondary | ICD-10-CM

## 2014-05-29 LAB — HEMOGLOBIN A1C: Hgb A1c MFr Bld: 5.4 % (ref 4.6–6.5)

## 2014-05-29 NOTE — Telephone Encounter (Signed)
-----   Message from Hendricks Limes, MD sent at 05/27/2014  2:05 PM EST ----- Please add A1c (R73.9)

## 2014-05-29 NOTE — Telephone Encounter (Signed)
Request for add on has been faxed to [facility] 

## 2014-06-01 ENCOUNTER — Ambulatory Visit (INDEPENDENT_AMBULATORY_CARE_PROVIDER_SITE_OTHER): Payer: Medicare Other | Admitting: Internal Medicine

## 2014-06-01 ENCOUNTER — Encounter: Payer: Self-pay | Admitting: Internal Medicine

## 2014-06-01 VITALS — BP 156/84 | HR 78 | Temp 97.7°F | Ht 61.0 in | Wt 174.5 lb

## 2014-06-01 DIAGNOSIS — R1011 Right upper quadrant pain: Secondary | ICD-10-CM

## 2014-06-01 MED ORDER — TRAMADOL HCL 50 MG PO TABS: ORAL_TABLET | ORAL | Status: DC

## 2014-06-01 NOTE — Patient Instructions (Signed)
If a rash appears in the area of pain; anti-shingles medication will be called in.

## 2014-06-01 NOTE — Progress Notes (Signed)
Pre visit review using our clinic review tool, if applicable. No additional management support is needed unless otherwise documented below in the visit note. 

## 2014-06-01 NOTE — Progress Notes (Signed)
   Subjective:    Patient ID: Bonnie Zhang, female    DOB: 11/07/1940, 74 y.o.   MRN: [id number]  HPI  Her symptoms began upon awakening 05/30/14 as dull pain up to level VII in the right inframammary area. This would radiate to the right posterior thorax. As of the afternoon of 3/2 she described the pain as up to level IX . It is better today & essentially gone. It had been better when supine but increased sitting.  Significant history includes cholecystectomy, total abdominal hysterectomy, and appendectomy.     Review of Systems    There is no significant cough, sputum production,hemoptysis, wheezing,or  paroxysmal nocturnal dyspnea. Unexplained weight loss, dysphagia, melena, or rectal bleeding are denied. Dysuria, pyuria, hematuria are denied.     Objective:   Physical Exam   Pertinent or positive findings include : There is no initiation of pain with compression of the thorax side to side or anteriorly to posteriorly.  She has no rub or pleuritic component with inspiration.  No rash is visualized.  Fusiform changes in her knees with decreased range of motion.  Homans sign is negative.  General appearance is one of good health and nourishment w/o distress. Eyes: No conjunctival inflammation or scleral icterus is present. Oral exam: Dental hygiene is good; lips and gums are healthy appearing.There is no oropharyngeal erythema or exudate noted.  Heart:  Normal rate and regular rhythm. S1 and S2 normal without gallop, murmur, click, rub or other extra sounds   Lungs:Chest clear to auscultation; no wheezes, rhonchi,or rales present.No increased work of breathing.  Abdomen: bowel sounds normal, soft and non-tender without masses, organomegaly or hernias noted.  No guarding or rebound . No tenderness over the flanks to percussion Musculoskeletal: Able to lie flat and sit up without help. Negative straight leg raising bilaterally. Gait slow & deliberate Skin:Warm & dry.  Intact  without suspicious lesions or rashes ; no jaundice or tenting Lymphatic: No lymphadenopathy is noted about the head, neck, axilla           Assessment & Plan:  #1 right inframammary pain with back radiation which has resolved. This clinical picture in the absence of the gallbladder suggests that this may be thoracic radiculopathy.  Plan: Because she has had adverse reactions to Vicodin; extra strength Tylenol would be recommended. Ultram trial if progession of pain.She will monitor for rash to rule out herpes zoster.

## 2014-06-08 ENCOUNTER — Telehealth: Payer: Self-pay | Admitting: Internal Medicine

## 2014-06-08 ENCOUNTER — Telehealth: Payer: Self-pay | Admitting: Cardiovascular Disease

## 2014-06-08 NOTE — Telephone Encounter (Signed)
Request for surgical clearance:  1. What type of surgery is being performed? Left knee replacement  2. When is this surgery scheduled? 07/03/14  3. Are there any medications that need to be held prior to surgery and how long? Pt doesn't know  4. Name of physician performing surgery? Dr. Deatra Canter  5. What is your office phone and fax number? Phone: [phone] Fax: [phone]   Pt calling wanting to know if she is cleared for surgery or if she needs to come in for another appt. Please call back and advise.

## 2014-06-08 NOTE — Telephone Encounter (Signed)
Please see her most recent complete exam. She is medically cleared for surgery. Please copy & FAX the CPX & this note.

## 2014-06-08 NOTE — Telephone Encounter (Signed)
Patient is having knee replacement surgery on 07/03/14 and she needs a surgery clearance letter sent to Dr. Artist Pais.

## 2014-06-08 NOTE — Telephone Encounter (Signed)
05/25/14 office note has been faxed to Dr Artist Pais

## 2014-06-09 NOTE — Telephone Encounter (Signed)
[place] for surgery and hold coumadin as needed with no lovenox bridge

## 2014-06-09 NOTE — Telephone Encounter (Signed)
PT HAD NORMAL  MYOVIEW  IN November 2015 ALSO  TAKES   COUMADIN  IS  SCHEDULED  FOR  TKR 07-03-14 WILL FORWARD TO DR Johnsie Cancel  FOR  REVIEW .Adonis Housekeeper

## 2014-06-09 NOTE — Telephone Encounter (Signed)
Patient called on 04/10/2014 and patient declined flu shot at that time per documentation

## 2014-06-09 NOTE — Telephone Encounter (Signed)
PT  AWARE  MAY PROCEED  WITH  SURGERY   AND  IS  AWARE  PHONE NOTE  FAXED   TO DR  Archie Endo OFF

## 2014-06-13 ENCOUNTER — Telehealth: Payer: Self-pay | Admitting: Internal Medicine

## 2014-06-13 NOTE — Telephone Encounter (Signed)
Patient's pain under right breast has moved and she is concerned. Please contact patient at her request.

## 2014-06-13 NOTE — Telephone Encounter (Signed)
Per Dr Linna Darner, patient needs an office visit.

## 2014-06-13 NOTE — Telephone Encounter (Signed)
Unfortunately she needs OV with labs & Xray as dictated by findings

## 2014-06-14 NOTE — Telephone Encounter (Signed)
Called patient scheduled for tomorrow morning

## 2014-06-15 ENCOUNTER — Other Ambulatory Visit: Payer: Self-pay | Admitting: Internal Medicine

## 2014-06-15 ENCOUNTER — Ambulatory Visit (INDEPENDENT_AMBULATORY_CARE_PROVIDER_SITE_OTHER)
Admission: RE | Admit: 2014-06-15 | Discharge: 2014-06-15 | Disposition: A | Discharge status: Home / Self Care | Payer: Medicare Other | Source: Ambulatory Visit | Attending: Internal Medicine | Admitting: Internal Medicine

## 2014-06-15 ENCOUNTER — Ambulatory Visit (INDEPENDENT_AMBULATORY_CARE_PROVIDER_SITE_OTHER): Payer: Medicare Other | Admitting: Internal Medicine

## 2014-06-15 ENCOUNTER — Encounter: Payer: Self-pay | Admitting: Internal Medicine

## 2014-06-15 VITALS — BP 148/76 | HR 67 | Temp 97.9°F | Ht 61.0 in | Wt 178.1 lb

## 2014-06-15 DIAGNOSIS — M546 Pain in thoracic spine: Secondary | ICD-10-CM

## 2014-06-15 DIAGNOSIS — M47814 Spondylosis without myelopathy or radiculopathy, thoracic region: Secondary | ICD-10-CM | POA: Diagnosis not present

## 2014-06-15 DIAGNOSIS — R079 Chest pain, unspecified: Secondary | ICD-10-CM | POA: Diagnosis not present

## 2014-06-15 DIAGNOSIS — M4184 Other forms of scoliosis, thoracic region: Secondary | ICD-10-CM | POA: Diagnosis not present

## 2014-06-15 MED ORDER — GABAPENTIN 100 MG PO CAPS: ORAL_CAPSULE | ORAL | Status: DC

## 2014-06-15 NOTE — Progress Notes (Signed)
Pre visit review using our clinic review tool, if applicable. No additional management support is needed unless otherwise documented below in the visit note. 

## 2014-06-15 NOTE — Patient Instructions (Signed)
  Your next office appointment will be determined based upon review of your pending  x-rays. Those instructions will be transmitted to you by mail. Critical values will be called.  Assess response to the gabapentin one every 8 hours as needed. If it is partially beneficial, it can be increased up to a total of 3 pills every 8 hours as needed. This increase of 1 pill each dose  should take place over 72 hours at least.If 300 mg is effective dose ; there is a 300 mg pill.  Followup as needed for any active or acute issue. Please report any significant change in your symptoms.

## 2014-06-15 NOTE — Progress Notes (Signed)
   Subjective:    Patient ID: Bonnie Zhang, female    DOB: [birth year], 74 y.o.   MRN: [id number]  HPI She continues to have intermittent right inframammary and right posterior thoracic and scapular area pain. It initially started 3/3; it was most severe 3/14 & 3/15. She's not had it since. It is described as dull up to level VI-VII and lasting minutes-hours.  It appears to be aggravated by sitting; she's had one episode when supine. It is not related to exertion, thorax rotation, or respirations. She also notes no relationship to meals. She did take Gas-X and PPI with no benefit.  PMH of DVT; on warfarin lifelong.PT/INR was therapeutic 2/10; repeat due 3/22. She has had a cholecystectomy.    Review of Systems  Palpitations, tachycardia, exertional dyspnea, paroxysmal nocturnal dyspnea, hemoptysis,claudication or edema are absent.  Unexplained weight loss, abdominal pain, significant dyspepsia, dysphagia, melena, rectal bleeding, or persistently small caliber stools are denied.  No associated headaches. Blurred vision , diplopia or vision loss absent. Vertigo, near syncope or imbalance denied. There is no numbness, tingling, or weakness in extremities.   No loss of control of bladder or bowels.      Objective:   Physical Exam Positive or pertinent findings include: BMI 33.66. An S4 is present.  She has decreased range of motion @ the right knee. There is decreased range of motion on the left with associated crepitus.  Homans sign is negative.  General appearance :adequately nourished; in no distress. Eyes: No conjunctival inflammation or scleral icterus is present. Oral exam:  Lips and gums are healthy appearing.There is no oropharyngeal erythema or exudate noted. Dental hygiene is good. Heart:  Normal rate and regular rhythm. S1 and S2 normal without gallop, murmur, click, rub or other extra sounds   Lungs:Chest clear to auscultation; no wheezes, rhonchi,rales ,or rubs  present.No increased work of breathing.  Abdomen: bowel sounds normal, soft and non-tender without masses, organomegaly or hernias noted.  No guarding or rebound. No flank tenderness to percussion. Vascular : all pulses equal ; no bruits present. Skin:Warm & dry.  Intact without suspicious lesions or rashes ; no tenting or jaundice  Lymphatic: No lymphadenopathy is noted about the head, neck, axilla Neuro: Strength, tone & DTRs normal.       Assessment & Plan:  #1 Right inframammary, posterior thorax, and scapular area pain. Thoracic radiculopathy is suggested.  Plan: See orders and recommendations

## 2014-06-20 ENCOUNTER — Ambulatory Visit (INDEPENDENT_AMBULATORY_CARE_PROVIDER_SITE_OTHER): Payer: Medicare Other | Admitting: General Practice

## 2014-06-20 ENCOUNTER — Other Ambulatory Visit: Payer: Self-pay | Admitting: General Practice

## 2014-06-20 DIAGNOSIS — Z5181 Encounter for therapeutic drug level monitoring: Secondary | ICD-10-CM

## 2014-06-20 DIAGNOSIS — Z7901 Long term (current) use of anticoagulants: Secondary | ICD-10-CM

## 2014-06-20 DIAGNOSIS — Z86718 Personal history of other venous thrombosis and embolism: Secondary | ICD-10-CM | POA: Diagnosis not present

## 2014-06-20 LAB — POCT INR: INR: 3.1

## 2014-06-20 NOTE — Patient Instructions (Signed)
Knee surgery on 4/4.  3/30 - Last dose of coumadin until after knee surgery.  No Lovenox needed per Dr. Johnsie Cancel.

## 2014-06-20 NOTE — Progress Notes (Signed)
Agree with plan 

## 2014-06-20 NOTE — Progress Notes (Signed)
Pre visit review using our clinic review tool, if applicable. No additional management support is needed unless otherwise documented below in the visit note. 

## 2014-06-21 ENCOUNTER — Ambulatory Visit: Payer: Medicare Other

## 2014-06-21 ENCOUNTER — Other Ambulatory Visit (HOSPITAL_COMMUNITY): Payer: Self-pay | Admitting: *Deleted

## 2014-06-21 DIAGNOSIS — M1712 Unilateral primary osteoarthritis, left knee: Secondary | ICD-10-CM | POA: Diagnosis not present

## 2014-06-21 NOTE — Pre-Procedure Instructions (Addendum)
Bonnie Zhang  06/21/2014   Your procedure is scheduled on:  Monday, July 03, 2014 at 9:55 AM.   Report to [facility] Entrance "A" Admitting Office at 8:00 AM.   Call this number if you have problems the morning of surgery: [phone]               Any questions prior to day of surgery, please call [phone] between 8 & 4 PM.    Remember:   Do not eat food or drink liquids after midnight Sunday, 07/02/14.    Take these medicines the morning of surgery with A SIP OF WATER: Amlodipine (Norvasc), Metoprolol (Lopressor), Pantoprazole (Protonix) - if needed, Tylenol - if needed.  Stop Coumadin 5 days prior to surgery or as instructed by physician.   Do not wear jewelry, make-up or nail polish.  Do not wear lotions, powders, or perfumes. You may not wear deodorant.  Do not shave 48 hours prior to surgery.   Do not bring valuables to the hospital.  [facility] is not responsible   for any belongings or valuables.               Contacts, dentures or bridgework may not be worn into surgery.  Leave suitcase in the car. After surgery it may be brought to your room.  For patients admitted to the hospital, discharge time is determined by your  treatment team.               Special Instructions: [facility] - Preparing for Surgery  Before surgery, you can play an important role.  Because skin is not sterile, your skin needs to be as free of germs as possible.  You can reduce the number of germs on you skin by washing with CHG (chlorahexidine gluconate) soap before surgery.  CHG is an antiseptic cleaner which kills germs and bonds with the skin to continue killing germs even after washing.  Please DO NOT use if you have an allergy to CHG or antibacterial soaps.  If your skin becomes reddened/irritated stop using the CHG and inform your nurse when you arrive at Short Stay.  Do not shave (including legs and underarms) for at least 48 hours prior to the first CHG shower.  You may  shave your face.  Please follow these instructions carefully:   1.  Shower with CHG Soap the night before surgery and the   morning of Surgery.  2.  If you choose to wash your hair, wash your hair first as usual with your  normal shampoo.  3.  After you shampoo, rinse your hair and body thoroughly to remove the   Shampoo.  4.  Use CHG as you would any other liquid soap.  You can apply chg directly   to the skin and wash gently with scrungie or a clean washcloth.  5.  Apply the CHG Soap to your body ONLY FROM THE NECK DOWN.        Do not use on open wounds or open sores.  Avoid contact with your eyes, ears, mouth and genitals (private parts).  Wash genitals (private parts) with your normal soap.  6.  Wash thoroughly, paying special attention to the area where your surgery  will be performed.  7.  Thoroughly rinse your body with warm water from the neck down.  8.  DO NOT shower/wash with your normal soap after using and rinsing off   the CHG Soap.  9.  Pat yourself dry  with a clean towel.            10.  Wear clean pajamas.            11.  Place clean sheets on your bed the night of your first shower and do not  sleep with pets.  Day of Surgery  Do not apply any lotions the morning of surgery.  Please wear clean clothes to the hospital.     Please read over the following fact sheets that you were given: Pain Booklet, Coughing and Deep Breathing, Blood Transfusion Information, MRSA Information and Surgical Site Infection Prevention

## 2014-06-21 NOTE — H&P (Addendum)
TOTAL KNEE ADMISSION H&P  Patient is being admitted for left total knee arthroplasty.  Subjective:  Chief Complaint:left knee pain.  HPI: Bonnie Zhang, 74 y.o. female, has a history of pain and functional disability in the left knee due to arthritis and has failed non-surgical conservative treatments for greater than 12 weeks to includeNSAID's and/or analgesics, corticosteriod injections, viscosupplementation injections, flexibility and strengthening excercises, supervised PT with diminished ADL's post treatment, use of assistive devices and activity modification.  Onset of symptoms was gradual, starting 10 years ago with gradually worsening course since that time. The patient noted no past surgery on the left knee(s).  Patient currently rates pain in the left knee(s) at 10 out of 10 with activity. Patient has night pain, worsening of pain with activity and weight bearing, pain that interferes with activities of daily living, crepitus and joint swelling.  Patient has evidence of subchondral sclerosis, joint subluxation and joint space narrowing by imaging studies. There is no active infection.  Patient Active Problem List   Diagnosis Date Noted  . Primary localized osteoarthritis of left knee   . 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 Encounter for therapeutic drug monitoring 06/07/2013  . Glucose intolerance (impaired glucose tolerance) 04/14/2013  . Postoperative anemia due to acute blood loss 08/13/2011  . GERD (gastroesophageal reflux disease)   . PVD (peripheral vascular disease)   . Unspecified adverse effect of unspecified drug, medicinal and biological substance 07/14/2011  . 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 10/09/2010  . Long term current use of anticoagulant 05/01/2010  . Osteoporosis 03/21/2010  . History of colonic polyps 03/20/2008  . Vitamin D deficiency 08/25/2007  . HYPERLIPIDEMIA 03/18/2007  . Dysmetabolic syndrome X 79/05/4095  .  Essential hypertension 03/18/2007  . DVT, HX OF 07/23/2006   Past Medical History  Diagnosis Date  . High cholesterol     takes Simvastating daily  . Vitamin D deficiency     takes VIt D daily  . DVT (deep vein thrombosis) in pregnancy      1965 post C section;2001 with prolonged driving while on  HRT  . Osteoporosis   . Hypertension     takes Benazepril,Amlodipine,and Metoprolol daily  . Hx of migraines     yrs ago   . Bruises easily     pt is on Coumadin;last dose of Coumadin 08/05/11 and then Lovenox started 08/07/11  . GERD (gastroesophageal reflux disease)   . Hx of colonic polyps     Dr Sharlett Iles  . PONV (postoperative nausea and vomiting)   . PVD (peripheral vascular disease) 1965, 2001    abnormal venous doppler findings due to recurrent DVT'S left leg  . Right knee DJD     knees  . Headache(784.0)     history - years ago, no prob x 40 yrs per pt  . Primary localized osteoarthritis of left knee   . Anticoagulated on Coumadin 2001    2 blood clots  . History of colon polyps 2007    Dr Sharlett Iles    Past Surgical History  Procedure Laterality Date  . Cesarean section  1961/1965/1966    X 3  . Colonoscopy w/ polypectomy  2007    Dr  Sharlett Iles; due? 2012  . Cholecystectomy  1997  . Dilation and curettage of uterus  2012    uterine polyp, Dr Kennon Rounds  . Appendectomy  1965  . Esophagogastroduodenoscopy  1997  . Total knee arthroplasty  08/11/2011    Procedure: TOTAL KNEE ARTHROPLASTY;  Surgeon: Lorn Junes, MD;  Location: [facility];  Service: Orthopedics;  Laterality: Right;  DR [place] THIS CASE  . Wisdom tooth extraction    . Hysteroscopy w/d&c N/A 08/05/2012    Procedure: DILATATION AND CURETTAGE /HYSTEROSCOPY;  Surgeon: Donnamae Jude, MD;  Location: [facility];  Service: Gynecology;  Laterality: N/A;  . Breast lumpectomy  1987  . Robotic assisted total hysterectomy with bilateral salpingo oopherectomy Right 09/14/2012    Procedure: ROBOTIC ASSISTED TOTAL  HYSTERECTOMY WITH BILATERAL SALPINGO OOPHORECTOMY ,right pelvic LYMPHNODE dissection;  Surgeon: Imagene Gurney A. Alycia Rossetti, MD;  Location: [facility];  Service: Gynecology;  Laterality: Right;    No current facility-administered medications on file prior to encounter.   Current Outpatient Prescriptions on File Prior to Encounter  Medication Sig Dispense Refill  . amLODipine (NORVASC) 5 MG tablet Take 1 tablet (5 mg total) by mouth daily. 90 tablet 1  . benazepril (LOTENSIN) 20 MG tablet Take 1 tablet (20 mg total) by mouth daily. 90 tablet 1  . Cholecalciferol 2000 UNITS CAPS Take 2,000 capsules by mouth daily.    . metoprolol tartrate (LOPRESSOR) 25 MG tablet 1 qd (Patient taking differently: Take 25 mg by mouth daily. 1 qd) 90 tablet 1  . pantoprazole (PROTONIX) 40 MG tablet Take 40 mg by mouth daily as needed. Acid reflux    . simvastatin (ZOCOR) 10 MG tablet TAKE ONE TABLET BY MOUTH AT BEDTIME 90 tablet 1  . warfarin (COUMADIN) 5 MG tablet Take 0.5-1 tablets (2.5-5 mg total) by mouth daily. 5mg  Monday, Wed, and Friday, except 2.5 mg on all other days. 90 tablet 1   Allergies  Allergen Reactions  . Vicodin [Hydrocodone-Acetaminophen]     Nightmares  . Aspirin Other (See Comments)    upset stomach  . Atorvastatin Other (See Comments)    leg pain  . Hctz [Hydrochlorothiazide] Other (See Comments)    FATIGUE AND EXTREMELY LOW POTASSIUM    History  Substance Use Topics  . Smoking status: Never Smoker   . Smokeless tobacco: Never Used  . Alcohol Use: No    Family History  Problem Relation Age of Onset  . Kidney disease Mother     ? hypertensive  . Hypertension Mother   . Deep vein thrombosis Mother     post ankle fracture  . Leukemia Father   . Colon cancer Maternal Grandmother   . Diabetes Other     cousins  . Anesthesia problems Neg Hx   . Hypotension Neg Hx   . Malignant hyperthermia Neg Hx   . Pseudochol deficiency Neg Hx   . Stroke Neg Hx   . Heart disease Neg Hx      Review of  Systems  Constitutional: Negative.   HENT: Negative.   Eyes: Negative.   Respiratory: Negative.   Cardiovascular: Negative.   Gastrointestinal: Negative.   Genitourinary: Negative.   Musculoskeletal: Positive for back pain and joint pain.  Skin: Negative.   Neurological: Negative.   Endo/Heme/Allergies: Negative.   Psychiatric/Behavioral: Negative.     Objective:  Physical Exam  Constitutional: She is oriented to person, place, and time. She appears well-developed and well-nourished.  HENT:  Head: Normocephalic and atraumatic.  Mouth/Throat: Oropharynx is clear and moist.  Eyes: Conjunctivae are normal. Pupils are equal, round, and reactive to light.  Neck: Normal range of motion. Neck supple.  Cardiovascular: Normal rate, regular rhythm and normal heart sounds.   Respiratory: Breath sounds normal.  GI: Bowel sounds are  normal.  Genitourinary:  Not pertinent to current symptomatology therefore not examined.  Musculoskeletal:  Examination of her left knee reveals pain medially and laterally 1+ crepitation 1+ synovitis range of motion -5 to 115 degrees knee is stable with diffuse pain and normal patella tracking. Exam of her right knee reveals well healed total knee incision without swelling or pain, full range of motion knee is stable with normal patella tracking. Vascular exam: pulses 2+ and symmetric  Neurological: She is alert and oriented to person, place, and time.  Skin: Skin is warm and dry.  Psychiatric: She has a normal mood and affect. Her behavior is normal.    Vital signs in last 24 hours: Temp:  [97.6 F (36.4 C)] 97.6 F (36.4 C) (03/23 1500) Pulse Rate:  [81] 81 (03/23 1500) BP: (153)/(84) 153/84 mmHg (03/23 1500) SpO2:  [98 %] 98 % (03/23 1500) Weight:  [79.379 kg (175 lb)] 79.379 kg (175 lb) (03/23 1500)  Labs:   Estimated body mass index is 33.08 kg/(m^2) as calculated from the following:   Height as of this encounter: 5\' 1"  (1.549 m).   Weight as of  this encounter: 79.379 kg (175 lb).   Imaging Review Plain radiographs demonstrate severe degenerative joint disease of the left knee(s). The overall alignment ismild varus. The bone quality appears to be good for age and reported activity level.  Assessment/Plan:  End stage arthritis, left knee  Active Problems:   Vitamin D deficiency   Essential hypertension   DVT, HX OF   Long term current use of anticoagulant   GERD (gastroesophageal reflux disease)   PVD (peripheral vascular disease)   Glucose intolerance (impaired glucose tolerance)   Primary localized osteoarthritis of left knee   The patient history, physical examination, clinical judgment of the provider and imaging studies are consistent with end stage degenerative joint disease of the left knee(s) and total knee arthroplasty is deemed medically necessary. The treatment options including medical management, injection therapy arthroscopy and arthroplasty were discussed at length. The risks and benefits of total knee arthroplasty were presented and reviewed. The risks due to aseptic loosening, infection, stiffness, patella tracking problems, thromboembolic complications and other imponderables were discussed. The patient acknowledged the explanation, agreed to proceed with the plan and consent was signed. Patient is being admitted for inpatient treatment for surgery, pain control, PT, OT, prophylactic antibiotics, VTE prophylaxis, progressive ambulation and ADL's and discharge planning. The patient is planning to be discharged home with home health services Lovenox bridge to coumadin post op due to DVT times 2.   Urine has 50,000 colony count.  Will use antibiotics in the cement and place on Ceftin preop.  Carmon Brigandi A. Kaleen Mask Physician Assistant [facility] [phone]  06/21/2014, 3:17 PM

## 2014-06-22 ENCOUNTER — Encounter (HOSPITAL_COMMUNITY)
Admission: RE | Admit: 2014-06-22 | Discharge: 2014-06-22 | Disposition: A | Discharge status: Home / Self Care | Payer: Medicare Other | Source: Ambulatory Visit | Attending: Orthopedic Surgery | Admitting: Orthopedic Surgery

## 2014-06-22 ENCOUNTER — Encounter (HOSPITAL_COMMUNITY): Payer: Self-pay

## 2014-06-22 DIAGNOSIS — I10 Essential (primary) hypertension: Secondary | ICD-10-CM | POA: Insufficient documentation

## 2014-06-22 DIAGNOSIS — Z7901 Long term (current) use of anticoagulants: Secondary | ICD-10-CM | POA: Diagnosis not present

## 2014-06-22 DIAGNOSIS — E559 Vitamin D deficiency, unspecified: Secondary | ICD-10-CM | POA: Diagnosis not present

## 2014-06-22 DIAGNOSIS — Z01812 Encounter for preprocedural laboratory examination: Secondary | ICD-10-CM | POA: Diagnosis not present

## 2014-06-22 DIAGNOSIS — I739 Peripheral vascular disease, unspecified: Secondary | ICD-10-CM | POA: Diagnosis not present

## 2014-06-22 DIAGNOSIS — M1712 Unilateral primary osteoarthritis, left knee: Secondary | ICD-10-CM | POA: Diagnosis not present

## 2014-06-22 DIAGNOSIS — R7302 Impaired glucose tolerance (oral): Secondary | ICD-10-CM | POA: Diagnosis not present

## 2014-06-22 DIAGNOSIS — Z96651 Presence of right artificial knee joint: Secondary | ICD-10-CM | POA: Insufficient documentation

## 2014-06-22 DIAGNOSIS — Z79899 Other long term (current) drug therapy: Secondary | ICD-10-CM | POA: Insufficient documentation

## 2014-06-22 DIAGNOSIS — E780 Pure hypercholesterolemia: Secondary | ICD-10-CM | POA: Diagnosis not present

## 2014-06-22 DIAGNOSIS — Z90710 Acquired absence of both cervix and uterus: Secondary | ICD-10-CM | POA: Diagnosis not present

## 2014-06-22 DIAGNOSIS — Z86718 Personal history of other venous thrombosis and embolism: Secondary | ICD-10-CM | POA: Diagnosis not present

## 2014-06-22 DIAGNOSIS — E8881 Metabolic syndrome: Secondary | ICD-10-CM | POA: Diagnosis not present

## 2014-06-22 DIAGNOSIS — K219 Gastro-esophageal reflux disease without esophagitis: Secondary | ICD-10-CM | POA: Insufficient documentation

## 2014-06-22 DIAGNOSIS — Z9049 Acquired absence of other specified parts of digestive tract: Secondary | ICD-10-CM | POA: Insufficient documentation

## 2014-06-22 DIAGNOSIS — M810 Age-related osteoporosis without current pathological fracture: Secondary | ICD-10-CM | POA: Insufficient documentation

## 2014-06-22 LAB — URINALYSIS, ROUTINE W REFLEX MICROSCOPIC
Bilirubin Urine: NEGATIVE
Glucose, UA: NEGATIVE mg/dL
Ketones, ur: NEGATIVE mg/dL
Leukocytes, UA: NEGATIVE
Nitrite: NEGATIVE
Protein, ur: NEGATIVE mg/dL
Specific Gravity, Urine: 1.025 (ref 1.005–1.030)
Urobilinogen, UA: 0.2 mg/dL (ref 0.0–1.0)
pH: 5.5 (ref 5.0–8.0)

## 2014-06-22 LAB — COMPREHENSIVE METABOLIC PANEL
ALT: 15 U/L (ref 0–35)
AST: 21 U/L (ref 0–37)
Albumin: 4 g/dL (ref 3.5–5.2)
Alkaline Phosphatase: 85 U/L (ref 39–117)
Anion gap: 7 (ref 5–15)
BUN: 14 mg/dL (ref 6–23)
CO2: 28 mmol/L (ref 19–32)
Calcium: 9.5 mg/dL (ref 8.4–10.5)
Chloride: 105 mmol/L (ref 96–112)
Creatinine, Ser: 0.82 mg/dL (ref 0.50–1.10)
GFR calc Af Amer: 81 mL/min — ABNORMAL LOW (ref >90)
GFR calc non Af Amer: 70 mL/min — ABNORMAL LOW (ref >90)
Glucose, Bld: 118 mg/dL — ABNORMAL HIGH (ref 70–99)
Potassium: 4 mmol/L (ref 3.5–5.1)
Sodium: 140 mmol/L (ref 135–145)
Total Bilirubin: 0.7 mg/dL (ref 0.3–1.2)
Total Protein: 7.6 g/dL (ref 6.0–8.3)

## 2014-06-22 LAB — CBC WITH DIFFERENTIAL/PLATELET
Basophils Absolute: 0 10*3/uL (ref 0.0–0.1)
Basophils Relative: 0 % (ref 0–1)
Eosinophils Absolute: 0.1 10*3/uL (ref 0.0–0.7)
Eosinophils Relative: 1 % (ref 0–5)
HCT: 45.3 % (ref 36.0–46.0)
Hemoglobin: 15 g/dL (ref 12.0–15.0)
Lymphocytes Relative: 36 % (ref 12–46)
Lymphs Abs: 2.4 10*3/uL (ref 0.7–4.0)
MCH: 29.8 pg (ref 26.0–34.0)
MCHC: 33.1 g/dL (ref 30.0–36.0)
MCV: 90.1 fL (ref 78.0–100.0)
Monocytes Absolute: 0.5 10*3/uL (ref 0.1–1.0)
Monocytes Relative: 7 % (ref 3–12)
Neutro Abs: 3.7 10*3/uL (ref 1.7–7.7)
Neutrophils Relative %: 56 % (ref 43–77)
Platelets: 188 10*3/uL (ref 150–400)
RBC: 5.03 MIL/uL (ref 3.87–5.11)
RDW: 13.2 % (ref 11.5–15.5)
WBC: 6.6 10*3/uL (ref 4.0–10.5)

## 2014-06-22 LAB — TYPE AND SCREEN
ABO/RH(D): A POS
Antibody Screen: NEGATIVE

## 2014-06-22 LAB — SURGICAL PCR SCREEN
MRSA, PCR: NEGATIVE
Staphylococcus aureus: NEGATIVE

## 2014-06-22 LAB — URINE MICROSCOPIC-ADD ON

## 2014-06-22 LAB — APTT: aPTT: 33 seconds (ref 24–37)

## 2014-06-22 LAB — PROTIME-INR
INR: 2.34 — ABNORMAL HIGH (ref 0.00–1.49)
Prothrombin Time: 25.8 seconds — ABNORMAL HIGH (ref 11.6–15.2)

## 2014-06-23 LAB — URINE CULTURE: Colony Count: 50000

## 2014-07-03 ENCOUNTER — Encounter (HOSPITAL_COMMUNITY): Admission: RE | Payer: Self-pay | Source: Ambulatory Visit

## 2014-07-03 ENCOUNTER — Inpatient Hospital Stay (HOSPITAL_COMMUNITY): Admission: RE | Admit: 2014-07-03 | Payer: Medicare Other | Source: Ambulatory Visit | Admitting: Orthopedic Surgery

## 2014-07-03 SURGERY — ARTHROPLASTY, KNEE, TOTAL
Anesthesia: General | Laterality: Left

## 2014-07-18 ENCOUNTER — Ambulatory Visit (INDEPENDENT_AMBULATORY_CARE_PROVIDER_SITE_OTHER): Payer: Medicare Other | Admitting: General Practice

## 2014-07-18 DIAGNOSIS — Z86718 Personal history of other venous thrombosis and embolism: Secondary | ICD-10-CM | POA: Diagnosis not present

## 2014-07-18 DIAGNOSIS — Z5181 Encounter for therapeutic drug level monitoring: Secondary | ICD-10-CM | POA: Diagnosis not present

## 2014-07-18 LAB — POCT INR: INR: 2.6

## 2014-07-18 NOTE — Progress Notes (Signed)
Pre visit review using our clinic review tool, if applicable. No additional management support is needed unless otherwise documented below in the visit note. 

## 2014-07-18 NOTE — Progress Notes (Signed)
Agree with plan 

## 2014-08-11 DIAGNOSIS — Z1231 Encounter for screening mammogram for malignant neoplasm of breast: Secondary | ICD-10-CM | POA: Diagnosis not present

## 2014-08-16 ENCOUNTER — Ambulatory Visit (INDEPENDENT_AMBULATORY_CARE_PROVIDER_SITE_OTHER): Payer: Medicare Other | Admitting: General Practice

## 2014-08-16 DIAGNOSIS — Z5181 Encounter for therapeutic drug level monitoring: Secondary | ICD-10-CM

## 2014-08-16 DIAGNOSIS — Z7901 Long term (current) use of anticoagulants: Secondary | ICD-10-CM | POA: Diagnosis not present

## 2014-08-16 DIAGNOSIS — Z86718 Personal history of other venous thrombosis and embolism: Secondary | ICD-10-CM | POA: Diagnosis not present

## 2014-08-16 LAB — POCT INR: INR: 1.6

## 2014-08-16 NOTE — Progress Notes (Signed)
Agree with plan 

## 2014-08-16 NOTE — Patient Instructions (Signed)
6/8 - Last dose of coumadin until after surgery.

## 2014-08-16 NOTE — Progress Notes (Signed)
Pre visit review using our clinic review tool, if applicable. No additional management support is needed unless otherwise documented below in the visit note. 

## 2014-08-23 ENCOUNTER — Ambulatory Visit: Payer: Self-pay | Admitting: Cardiovascular Disease

## 2014-08-30 DIAGNOSIS — M1712 Unilateral primary osteoarthritis, left knee: Secondary | ICD-10-CM | POA: Diagnosis not present

## 2014-08-30 NOTE — H&P (Addendum)
TOTAL KNEE ADMISSION H&P  Patient is being admitted for left total knee arthroplasty.  Subjective:  Chief Complaint:left knee pain.  HPI: Bonnie Zhang, 74 y.o. female, has a history of pain and functional disability in the left knee due to arthritis and has failed non-surgical conservative treatments for greater than 12 weeks to includeNSAID's and/or analgesics, corticosteriod injections, viscosupplementation injections, flexibility and strengthening excercises, supervised PT with diminished ADL's post treatment, use of assistive devices and activity modification.  Onset of symptoms was gradual, starting 10 years ago with gradually worsening course since that time. The patient noted no past surgery on the left knee(s).  Patient currently rates pain in the left knee(s) at 10 out of 10 with activity. Patient has night pain, worsening of pain with activity and weight bearing, pain that interferes with activities of daily living, crepitus and joint swelling.  Patient has evidence of subchondral sclerosis, joint subluxation and joint space narrowing by imaging studies.  There is no active infection.  Patient Active Problem List   Diagnosis Date Noted  . Primary localized osteoarthritis of left knee   . 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 Encounter for therapeutic drug monitoring 06/07/2013  . Glucose intolerance (impaired glucose tolerance) 04/14/2013  . Postoperative anemia due to acute blood loss 08/13/2011  . GERD (gastroesophageal reflux disease)   . PVD (peripheral vascular disease)   . Unspecified adverse effect of unspecified drug, medicinal and biological substance 07/14/2011  . 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 10/09/2010  . Long term current use of anticoagulant 05/01/2010  . Osteoporosis 03/21/2010  . History of colonic polyps 03/20/2008  . Vitamin D deficiency 08/25/2007  . HYPERLIPIDEMIA 03/18/2007  . Dysmetabolic syndrome X 53/29/9242  .  Essential hypertension 03/18/2007  . DVT, HX OF 07/23/2006   Past Medical History  Diagnosis Date  . High cholesterol     takes Simvastating daily  . Vitamin D deficiency     takes VIt D daily  . DVT (deep vein thrombosis) in pregnancy      1965 post C section;2001 with prolonged driving while on  HRT  . Osteoporosis   . Hypertension     takes Benazepril,Amlodipine,and Metoprolol daily  . Hx of migraines     yrs ago   . Bruises easily     pt is on Coumadin;last dose of Coumadin 08/05/11 and then Lovenox started 08/07/11  . GERD (gastroesophageal reflux disease)   . Hx of colonic polyps     Dr Sharlett Iles  . PVD (peripheral vascular disease) 1965, 2001    abnormal venous doppler findings due to recurrent DVT'S left leg  . Right knee DJD     knees  . Primary localized osteoarthritis of left knee   . Anticoagulated on Coumadin 2001    2 blood clots  . History of colon polyps 2007    Dr Sharlett Iles  . PONV (postoperative nausea and vomiting)     Past Surgical History  Procedure Laterality Date  . Cesarean section  1961/1965/1966    X 3  . Colonoscopy w/ polypectomy  2007    Dr  Sharlett Iles; due? 2012  . Cholecystectomy  1997  . Dilation and curettage of uterus  2012    uterine polyp, Dr Kennon Rounds  . Appendectomy  1965  . Esophagogastroduodenoscopy  1997  . Total knee arthroplasty  08/11/2011    Procedure: TOTAL KNEE ARTHROPLASTY;  Surgeon: Lorn Junes, MD;  Location: [facility];  Service: Orthopedics;  Laterality: Right;  DR  [place] THIS CASE  . Wisdom tooth extraction    . Hysteroscopy w/d&c N/A 08/05/2012    Procedure: DILATATION AND CURETTAGE /HYSTEROSCOPY;  Surgeon: Donnamae Jude, MD;  Location: [facility];  Service: Gynecology;  Laterality: N/A;  . Breast lumpectomy  1987  . Robotic assisted total hysterectomy with bilateral salpingo oopherectomy Right 09/14/2012    Procedure: ROBOTIC ASSISTED TOTAL HYSTERECTOMY WITH BILATERAL SALPINGO OOPHORECTOMY ,right pelvic  LYMPHNODE dissection;  Surgeon: Imagene Gurney A. Alycia Rossetti, MD;  Location: [facility];  Service: Gynecology;  Laterality: Right;    No current facility-administered medications for this encounter.  Current outpatient prescriptions:  .  acetaminophen (TYLENOL) 500 MG tablet, Take 1,000 mg by mouth every 8 (eight) hours as needed for mild pain or moderate pain., Disp: , Rfl:  .  amLODipine (NORVASC) 5 MG tablet, Take 1 tablet (5 mg total) by mouth daily., Disp: 90 tablet, Rfl: 1 .  benazepril (LOTENSIN) 20 MG tablet, Take 1 tablet (20 mg total) by mouth daily., Disp: 90 tablet, Rfl: 1 .  Cholecalciferol 2000 UNITS CAPS, Take 2,000 capsules by mouth daily., Disp: , Rfl:  .  metoprolol tartrate (LOPRESSOR) 25 MG tablet, 1 qd (Patient taking differently: Take 25 mg by mouth daily. 1 qd), Disp: 90 tablet, Rfl: 1 .  pantoprazole (PROTONIX) 40 MG tablet, Take 40 mg by mouth daily as needed. Acid reflux, Disp: , Rfl:  .  simvastatin (ZOCOR) 10 MG tablet, TAKE ONE TABLET BY MOUTH AT BEDTIME, Disp: 90 tablet, Rfl: 1 .  warfarin (COUMADIN) 5 MG tablet, Take 0.5-1 tablets (2.5-5 mg total) by mouth daily. 5mg  Monday, Wed, and Friday, except 2.5 mg on all other days., Disp: 90 tablet, Rfl: 1   Allergies  Allergen Reactions  . Vicodin [Hydrocodone-Acetaminophen]     Nightmares  . Aspirin Other (See Comments)    upset stomach  . Atorvastatin Other (See Comments)    leg pain  . Hctz [Hydrochlorothiazide] Other (See Comments)    FATIGUE AND EXTREMELY LOW POTASSIUM    History  Substance Use Topics  . Smoking status: Never Smoker   . Smokeless tobacco: Never Used  . Alcohol Use: No    Family History  Problem Relation Age of Onset  . Kidney disease Mother     ? hypertensive  . Hypertension Mother   . Deep vein thrombosis Mother     post ankle fracture  . Leukemia Father   . Colon cancer Maternal Grandmother   . Diabetes Other     cousins  . Anesthesia problems Neg Hx   . Hypotension Neg Hx   . Malignant  hyperthermia Neg Hx   . Pseudochol deficiency Neg Hx   . Stroke Neg Hx   . Heart disease Neg Hx      Review of Systems  Constitutional: Negative.   HENT: Negative.   Eyes: Negative.   Respiratory: Negative.   Cardiovascular: Negative.   Gastrointestinal: Negative.   Genitourinary: Negative.   Musculoskeletal: Positive for back pain and joint pain.  Skin: Negative.   Neurological: Negative.   Endo/Heme/Allergies: Negative.     Objective:  Physical Exam  Constitutional: She is oriented to person, place, and time. She appears well-developed and well-nourished.  HENT:  Head: Normocephalic and atraumatic.  Mouth/Throat: Oropharynx is clear and moist.  Eyes: Conjunctivae are normal. Pupils are equal, round, and reactive to light.  Neck: Neck supple.  Cardiovascular: Normal rate, regular rhythm and normal heart sounds.   Respiratory: Effort normal and  breath sounds normal.  GI: Soft. Bowel sounds are normal.  Musculoskeletal:  Left knee shows active range of motion -5 to 115 degrees.  Medial and lateral joint line tenderness.  Normal patella tracking.  Distal neurovascular exam is intact.  Right knee has well healed total knee incision without pain, swelling or deformity.  She has 2+ dorsalis pedis pulses.    Neurological: She is alert and oriented to person, place, and time.  Skin: Skin is warm and dry.  Psychiatric: She has a normal mood and affect. Her behavior is normal.    Vital signs in last 24 hours: Temp:  [98.5 F (36.9 C)] 98.5 F (36.9 C) (06/01 1500) Pulse Rate:  [92] 92 (06/01 1500) BP: (152)/(84) 152/84 mmHg (06/01 1500) SpO2:  [98 %] 98 % (06/01 1500) Weight:  [83.008 kg (183 lb)] 83.008 kg (183 lb) (06/01 1500)  Labs:   Estimated body mass index is 35.74 kg/(m^2) as calculated from the following:   Height as of this encounter: 5' (1.524 m).   Weight as of this encounter: 83.008 kg (183 lb).   Imaging Review Plain radiographs demonstrate severe  degenerative joint disease of the left knee(s). The overall alignment issignificant varus. The bone quality appears to be excellent for age and reported activity level.  Assessment/Plan:  End stage arthritis, left knee  Active Problems:   Vitamin D deficiency   Essential hypertension   DVT, HX OF   GERD (gastroesophageal reflux disease)   Primary localized osteoarthritis of left knee   The patient history, physical examination, clinical judgment of the provider and imaging studies are consistent with end stage degenerative joint disease of the left knee(s) and total knee arthroplasty is deemed medically necessary. The treatment options including medical management, injection therapy arthroscopy and arthroplasty were discussed at length. The risks and benefits of total knee arthroplasty were presented and reviewed. The risks due to aseptic loosening, infection, stiffness, patella tracking problems, thromboembolic complications and other imponderables were discussed. The patient acknowledged the explanation, agreed to proceed with the plan and consent was signed. Patient is being admitted for inpatient treatment for surgery, pain control, PT, OT, prophylactic antibiotics, VTE prophylaxis, progressive ambulation and ADL's and discharge planning. The patient is planning to be discharged home with home health services   Urine had a 90,000 colony count.  She was started on Ceftin 500mg  BID on 6/9.  She will need a cath UA on admission when catheter is put in preoperatively.  We will use antibiotics in her cement  Maveric Debono A. Kaleen Mask Physician Assistant [facility] [phone]  08/30/2014, 3:10 PM

## 2014-08-31 ENCOUNTER — Encounter (HOSPITAL_COMMUNITY)
Admission: RE | Admit: 2014-08-31 | Discharge: 2014-08-31 | Disposition: A | Discharge status: Home / Self Care | Payer: Medicare Other | Source: Ambulatory Visit | Attending: Orthopedic Surgery | Admitting: Orthopedic Surgery

## 2014-08-31 ENCOUNTER — Ambulatory Visit (HOSPITAL_COMMUNITY)
Admission: RE | Admit: 2014-08-31 | Discharge: 2014-08-31 | Disposition: A | Discharge status: Home / Self Care | Payer: Medicare Other | Source: Ambulatory Visit | Attending: Anesthesiology | Admitting: Anesthesiology

## 2014-08-31 ENCOUNTER — Encounter (HOSPITAL_COMMUNITY): Payer: Self-pay

## 2014-08-31 DIAGNOSIS — Z01811 Encounter for preprocedural respiratory examination: Secondary | ICD-10-CM

## 2014-08-31 DIAGNOSIS — R05 Cough: Secondary | ICD-10-CM | POA: Insufficient documentation

## 2014-08-31 HISTORY — DX: Acute embolism and thrombosis of unspecified deep veins of unspecified lower extremity: I82.409

## 2014-08-31 HISTORY — DX: Reserved for inherently not codable concepts without codable children: IMO0001

## 2014-08-31 LAB — COMPREHENSIVE METABOLIC PANEL
ALT: 12 U/L — ABNORMAL LOW (ref 14–54)
AST: 17 U/L (ref 15–41)
Albumin: 3.9 g/dL (ref 3.5–5.0)
Alkaline Phosphatase: 84 U/L (ref 38–126)
Anion gap: 10 (ref 5–15)
BUN: 11 mg/dL (ref 6–20)
CO2: 24 mmol/L (ref 22–32)
Calcium: 9.5 mg/dL (ref 8.9–10.3)
Chloride: 106 mmol/L (ref 101–111)
Creatinine, Ser: 0.8 mg/dL (ref 0.44–1.00)
GFR calc Af Amer: >60 mL/min (ref >60)
GFR calc non Af Amer: >60 mL/min (ref >60)
Glucose, Bld: 100 mg/dL — ABNORMAL HIGH (ref 65–99)
Potassium: 3.8 mmol/L (ref 3.5–5.1)
Sodium: 140 mmol/L (ref 135–145)
Total Bilirubin: 0.4 mg/dL (ref 0.3–1.2)
Total Protein: 7.7 g/dL (ref 6.5–8.1)

## 2014-08-31 LAB — CBC WITH DIFFERENTIAL/PLATELET
Basophils Absolute: 0 10*3/uL (ref 0.0–0.1)
Basophils Relative: 0 % (ref 0–1)
Eosinophils Absolute: 0.1 10*3/uL (ref 0.0–0.7)
Eosinophils Relative: 2 % (ref 0–5)
HCT: 43.9 % (ref 36.0–46.0)
Hemoglobin: 14.9 g/dL (ref 12.0–15.0)
Lymphocytes Relative: 47 % — ABNORMAL HIGH (ref 12–46)
Lymphs Abs: 2.6 10*3/uL (ref 0.7–4.0)
MCH: 29.9 pg (ref 26.0–34.0)
MCHC: 33.9 g/dL (ref 30.0–36.0)
MCV: 88 fL (ref 78.0–100.0)
Monocytes Absolute: 0.4 10*3/uL (ref 0.1–1.0)
Monocytes Relative: 7 % (ref 3–12)
Neutro Abs: 2.5 10*3/uL (ref 1.7–7.7)
Neutrophils Relative %: 44 % (ref 43–77)
Platelets: 202 10*3/uL (ref 150–400)
RBC: 4.99 MIL/uL (ref 3.87–5.11)
RDW: 12.7 % (ref 11.5–15.5)
WBC: 5.6 10*3/uL (ref 4.0–10.5)

## 2014-08-31 LAB — URINE MICROSCOPIC-ADD ON

## 2014-08-31 LAB — APTT: aPTT: 30 seconds (ref 24–37)

## 2014-08-31 LAB — URINALYSIS, ROUTINE W REFLEX MICROSCOPIC
Bilirubin Urine: NEGATIVE
Glucose, UA: NEGATIVE mg/dL
Ketones, ur: NEGATIVE mg/dL
Leukocytes, UA: NEGATIVE
Nitrite: NEGATIVE
Protein, ur: NEGATIVE mg/dL
Specific Gravity, Urine: 1.015 (ref 1.005–1.030)
Urobilinogen, UA: 0.2 mg/dL (ref 0.0–1.0)
pH: 5.5 (ref 5.0–8.0)

## 2014-08-31 LAB — SURGICAL PCR SCREEN
MRSA, PCR: NEGATIVE
Staphylococcus aureus: NEGATIVE

## 2014-08-31 LAB — TYPE AND SCREEN
ABO/RH(D): A POS
Antibody Screen: NEGATIVE

## 2014-08-31 MED ORDER — POVIDONE-IODINE 7.5 % EX SOLN: Freq: Once | CUTANEOUS | Status: DC

## 2014-08-31 MED ORDER — CHLORHEXIDINE GLUCONATE 4 % EX LIQD: 60.0000 mL | Freq: Once | CUTANEOUS | Status: DC

## 2014-08-31 MED ORDER — LACTATED RINGERS IV SOLN: INTRAVENOUS | Status: DC

## 2014-08-31 MED ORDER — CEFAZOLIN SODIUM-DEXTROSE 2-3 GM-% IV SOLR: 2.0000 g | INTRAVENOUS | Status: DC

## 2014-08-31 NOTE — Pre-Procedure Instructions (Signed)
   Bonnie Zhang  08/31/2014     Your procedure is scheduled on  Monday, September 11, 2014.  Report to [facility] Admitting at 8:00 A.M.   Call this number if you have problems the morning of surgery:  [phone]              For any other questions, please call [phone], Monday - Friday 8 AM - 4 PM.   Remember:  Do not eat food or drink liquids after midnight.  Take these medicines the morning of surgery with A SIP OF WATER Amlodipine (Norvasc), Metoprolol Tartrate (Lopressor), and Pantoprazole (Protonix).  Tylenol if needed.                Stop Warfarin(Coumadin) per Dr Archie Endo instructions.  Stop taking Aspirin, Aleve, Ibuprofen, BC, Goody's, Herbal medication, and Fish Oil on Monday June 6.   Do not wear jewelry, make-up or nail polish.  Do not wear lotions, powders, or perfumes.    Do not shave 48 hours prior to surgery.    Do not bring valuables to the hospital.  [facility] is not responsible for any belongings or valuables.  Contacts, dentures or bridgework may not be worn into surgery.  Leave your suitcase in the car.  After surgery it may be brought to your room.  For patients admitted to the hospital, discharge time will be determined by your treatment team.  Patients discharged the day of surgery will not be allowed to drive home.    Special instructions:    Please read over the following fact sheets that you were given. Pain Booklet, Coughing and Deep Breathing, Total Joint Packet, MRSA Information and Surgical Site Infection Prevention and Incentive Spirometry.

## 2014-08-31 NOTE — Progress Notes (Signed)
Bonnie Zhang denies chest pain or shortness of breath.  Patient is to  Stop Cumadin after June 8 as instructed by Dr.

## 2014-09-01 ENCOUNTER — Encounter: Payer: Self-pay | Admitting: Internal Medicine

## 2014-09-02 LAB — URINE CULTURE: Colony Count: 90000

## 2014-09-11 ENCOUNTER — Inpatient Hospital Stay (HOSPITAL_COMMUNITY)
Admission: RE | Admit: 2014-09-11 | Discharge: 2014-09-13 | DRG: 470 | Disposition: A | Discharge status: Home Health | Payer: Medicare Other | Source: Ambulatory Visit | Attending: Orthopedic Surgery | Admitting: Orthopedic Surgery

## 2014-09-11 ENCOUNTER — Inpatient Hospital Stay (HOSPITAL_COMMUNITY): Payer: Medicare Other | Admitting: Anesthesiology

## 2014-09-11 ENCOUNTER — Encounter (HOSPITAL_COMMUNITY): Payer: Self-pay | Admitting: Anesthesiology

## 2014-09-11 ENCOUNTER — Encounter (HOSPITAL_COMMUNITY)
Admission: RE | Disposition: A | Discharge status: Home Health | Payer: Self-pay | Source: Ambulatory Visit | Attending: Orthopedic Surgery

## 2014-09-11 DIAGNOSIS — I10 Essential (primary) hypertension: Secondary | ICD-10-CM | POA: Diagnosis not present

## 2014-09-11 DIAGNOSIS — Z7901 Long term (current) use of anticoagulants: Secondary | ICD-10-CM | POA: Diagnosis not present

## 2014-09-11 DIAGNOSIS — I739 Peripheral vascular disease, unspecified: Secondary | ICD-10-CM | POA: Diagnosis present

## 2014-09-11 DIAGNOSIS — M179 Osteoarthritis of knee, unspecified: Secondary | ICD-10-CM | POA: Diagnosis present

## 2014-09-11 DIAGNOSIS — K219 Gastro-esophageal reflux disease without esophagitis: Secondary | ICD-10-CM | POA: Diagnosis present

## 2014-09-11 DIAGNOSIS — Z833 Family history of diabetes mellitus: Secondary | ICD-10-CM | POA: Diagnosis not present

## 2014-09-11 DIAGNOSIS — M810 Age-related osteoporosis without current pathological fracture: Secondary | ICD-10-CM | POA: Diagnosis not present

## 2014-09-11 DIAGNOSIS — Z86718 Personal history of other venous thrombosis and embolism: Secondary | ICD-10-CM

## 2014-09-11 DIAGNOSIS — Z8249 Family history of ischemic heart disease and other diseases of the circulatory system: Secondary | ICD-10-CM

## 2014-09-11 DIAGNOSIS — Z806 Family history of leukemia: Secondary | ICD-10-CM

## 2014-09-11 DIAGNOSIS — E780 Pure hypercholesterolemia: Secondary | ICD-10-CM | POA: Diagnosis not present

## 2014-09-11 DIAGNOSIS — Z79899 Other long term (current) drug therapy: Secondary | ICD-10-CM

## 2014-09-11 DIAGNOSIS — M1710 Unilateral primary osteoarthritis, unspecified knee: Secondary | ICD-10-CM | POA: Diagnosis present

## 2014-09-11 DIAGNOSIS — M1712 Unilateral primary osteoarthritis, left knee: Principal | ICD-10-CM | POA: Diagnosis present

## 2014-09-11 DIAGNOSIS — Z96652 Presence of left artificial knee joint: Secondary | ICD-10-CM | POA: Diagnosis not present

## 2014-09-11 DIAGNOSIS — E559 Vitamin D deficiency, unspecified: Secondary | ICD-10-CM | POA: Diagnosis not present

## 2014-09-11 DIAGNOSIS — G8918 Other acute postprocedural pain: Secondary | ICD-10-CM | POA: Diagnosis not present

## 2014-09-11 DIAGNOSIS — E785 Hyperlipidemia, unspecified: Secondary | ICD-10-CM | POA: Diagnosis present

## 2014-09-11 DIAGNOSIS — M25562 Pain in left knee: Secondary | ICD-10-CM | POA: Diagnosis present

## 2014-09-11 HISTORY — PX: TOTAL KNEE ARTHROPLASTY: SHX125

## 2014-09-11 LAB — CBC
HCT: 41.3 % (ref 36.0–46.0)
Hemoglobin: 14 g/dL (ref 12.0–15.0)
MCH: 29.5 pg (ref 26.0–34.0)
MCHC: 33.9 g/dL (ref 30.0–36.0)
MCV: 87.1 fL (ref 78.0–100.0)
Platelets: 180 10*3/uL (ref 150–400)
RBC: 4.74 MIL/uL (ref 3.87–5.11)
RDW: 12.3 % (ref 11.5–15.5)
WBC: 9.2 10*3/uL (ref 4.0–10.5)

## 2014-09-11 LAB — URINALYSIS, ROUTINE W REFLEX MICROSCOPIC
Bilirubin Urine: NEGATIVE
Glucose, UA: NEGATIVE mg/dL
Hgb urine dipstick: NEGATIVE
Ketones, ur: NEGATIVE mg/dL
Leukocytes, UA: NEGATIVE
Nitrite: NEGATIVE
Protein, ur: NEGATIVE mg/dL
Specific Gravity, Urine: 1.014 (ref 1.005–1.030)
Urobilinogen, UA: 0.2 mg/dL (ref 0.0–1.0)
pH: 5.5 (ref 5.0–8.0)

## 2014-09-11 LAB — CREATININE, SERUM
Creatinine, Ser: 0.72 mg/dL (ref 0.44–1.00)
GFR calc Af Amer: >60 mL/min (ref >60)
GFR calc non Af Amer: >60 mL/min (ref >60)

## 2014-09-11 LAB — PROTIME-INR
INR: 1.09 (ref 0.00–1.49)
Prothrombin Time: 14.3 seconds (ref 11.6–15.2)

## 2014-09-11 SURGERY — ARTHROPLASTY, KNEE, TOTAL
Anesthesia: Regional | Site: Knee | Laterality: Left

## 2014-09-11 MED ORDER — ALBUTEROL SULFATE HFA 108 (90 BASE) MCG/ACT IN AERS
INHALATION_SPRAY | RESPIRATORY_TRACT | Status: DC | PRN
  Administered 2014-09-11: 6 via RESPIRATORY_TRACT

## 2014-09-11 MED ORDER — POLYETHYLENE GLYCOL 3350 17 G PO PACK
17.0000 g | PACK | Freq: Two times a day (BID) | ORAL | Status: DC
  Administered 2014-09-12 – 2014-09-13 (×3): 17 g via ORAL
  Filled 2014-09-11 (×4): qty 1

## 2014-09-11 MED ORDER — MENTHOL 3 MG MT LOZG: 1.0000 | LOZENGE | OROMUCOSAL | Status: DC | PRN

## 2014-09-11 MED ORDER — CEFUROXIME SODIUM 1.5 G IJ SOLR
INTRAMUSCULAR | Status: DC | PRN
  Administered 2014-09-11: 1.5 g

## 2014-09-11 MED ORDER — BUPIVACAINE-EPINEPHRINE (PF) 0.5% -1:200000 IJ SOLN
INTRAMUSCULAR | Status: DC | PRN
  Administered 2014-09-11: 20 mL via PERINEURAL

## 2014-09-11 MED ORDER — POTASSIUM CHLORIDE IN NACL 20-0.9 MEQ/L-% IV SOLN
INTRAVENOUS | Status: DC
  Administered 2014-09-12: 02:00:00 via INTRAVENOUS
  Filled 2014-09-11: qty 1000

## 2014-09-11 MED ORDER — METOCLOPRAMIDE HCL 5 MG/ML IJ SOLN: 5.0000 mg | Freq: Three times a day (TID) | INTRAMUSCULAR | Status: DC | PRN

## 2014-09-11 MED ORDER — PROPOFOL INFUSION 10 MG/ML OPTIME
INTRAVENOUS | Status: DC | PRN
  Administered 2014-09-11: 100 ug/kg/min via INTRAVENOUS

## 2014-09-11 MED ORDER — ONDANSETRON HCL 4 MG PO TABS: 4.0000 mg | ORAL_TABLET | Freq: Four times a day (QID) | ORAL | Status: DC | PRN

## 2014-09-11 MED ORDER — DOCUSATE SODIUM 100 MG PO CAPS
100.0000 mg | ORAL_CAPSULE | Freq: Two times a day (BID) | ORAL | Status: DC
  Administered 2014-09-11 – 2014-09-13 (×4): 100 mg via ORAL
  Filled 2014-09-11 (×5): qty 1

## 2014-09-11 MED ORDER — LIDOCAINE HCL (CARDIAC) 20 MG/ML IV SOLN
INTRAVENOUS | Status: AC
  Filled 2014-09-11: qty 5

## 2014-09-11 MED ORDER — DEXAMETHASONE SODIUM PHOSPHATE 10 MG/ML IJ SOLN
INTRAMUSCULAR | Status: DC | PRN
  Administered 2014-09-11: 10 mg via INTRAVENOUS

## 2014-09-11 MED ORDER — BUPIVACAINE-EPINEPHRINE 0.25% -1:200000 IJ SOLN
INTRAMUSCULAR | Status: DC | PRN
  Administered 2014-09-11: 30 mL

## 2014-09-11 MED ORDER — HYDROMORPHONE HCL 1 MG/ML IJ SOLN: 0.2500 mg | INTRAMUSCULAR | Status: DC | PRN

## 2014-09-11 MED ORDER — NEOSTIGMINE METHYLSULFATE 10 MG/10ML IV SOLN
INTRAVENOUS | Status: DC | PRN
  Administered 2014-09-11: 4 mg via INTRAVENOUS

## 2014-09-11 MED ORDER — METOPROLOL TARTRATE 25 MG PO TABS
25.0000 mg | ORAL_TABLET | Freq: Every day | ORAL | Status: DC
  Administered 2014-09-12 – 2014-09-13 (×2): 25 mg via ORAL
  Filled 2014-09-11 (×2): qty 1

## 2014-09-11 MED ORDER — ALUM & MAG HYDROXIDE-SIMETH 200-200-20 MG/5ML PO SUSP: 30.0000 mL | ORAL | Status: DC | PRN

## 2014-09-11 MED ORDER — CHLORHEXIDINE GLUCONATE 4 % EX LIQD: 60.0000 mL | Freq: Once | CUTANEOUS | Status: DC

## 2014-09-11 MED ORDER — DEXAMETHASONE SODIUM PHOSPHATE 10 MG/ML IJ SOLN
INTRAMUSCULAR | Status: AC
  Filled 2014-09-11: qty 1

## 2014-09-11 MED ORDER — DIPHENHYDRAMINE HCL 12.5 MG/5ML PO ELIX
12.5000 mg | ORAL_SOLUTION | ORAL | Status: DC | PRN
  Administered 2014-09-11: 25 mg via ORAL
  Filled 2014-09-11: qty 10

## 2014-09-11 MED ORDER — ONDANSETRON HCL 4 MG/2ML IJ SOLN
4.0000 mg | Freq: Four times a day (QID) | INTRAMUSCULAR | Status: DC | PRN
Start: 2014-09-11 — End: 2014-09-13

## 2014-09-11 MED ORDER — OXYCODONE HCL 5 MG/5ML PO SOLN: 5.0000 mg | Freq: Once | ORAL | Status: DC | PRN

## 2014-09-11 MED ORDER — CEFAZOLIN SODIUM-DEXTROSE 2-3 GM-% IV SOLR
2.0000 g | INTRAVENOUS | Status: AC
  Administered 2014-09-11: 2 g via INTRAVENOUS
  Filled 2014-09-11: qty 50

## 2014-09-11 MED ORDER — LACTATED RINGERS IV SOLN
INTRAVENOUS | Status: DC
  Administered 2014-09-11: 10 mL/h via INTRAVENOUS
  Administered 2014-09-11: 11:00:00 via INTRAVENOUS

## 2014-09-11 MED ORDER — DEXTROSE 5 % IV SOLN
INTRAVENOUS | Status: DC | PRN
  Administered 2014-09-11: 09:00:00 via INTRAVENOUS

## 2014-09-11 MED ORDER — PROPOFOL 10 MG/ML IV BOLUS
INTRAVENOUS | Status: AC
  Filled 2014-09-11: qty 20

## 2014-09-11 MED ORDER — SCOPOLAMINE 1 MG/3DAYS TD PT72
MEDICATED_PATCH | TRANSDERMAL | Status: DC | PRN
  Administered 2014-09-11: 1 via TRANSDERMAL

## 2014-09-11 MED ORDER — ACETAMINOPHEN 650 MG RE SUPP: 650.0000 mg | Freq: Four times a day (QID) | RECTAL | Status: DC | PRN

## 2014-09-11 MED ORDER — DEXAMETHASONE SODIUM PHOSPHATE 10 MG/ML IJ SOLN
10.0000 mg | Freq: Three times a day (TID) | INTRAMUSCULAR | Status: DC
  Administered 2014-09-11 – 2014-09-13 (×6): 10 mg via INTRAVENOUS
  Filled 2014-09-11 (×6): qty 1

## 2014-09-11 MED ORDER — CEFUROXIME SODIUM 1.5 G IJ SOLR
INTRAMUSCULAR | Status: AC
  Filled 2014-09-11: qty 1.5

## 2014-09-11 MED ORDER — ALBUTEROL SULFATE HFA 108 (90 BASE) MCG/ACT IN AERS
INHALATION_SPRAY | RESPIRATORY_TRACT | Status: AC
  Filled 2014-09-11: qty 6.7

## 2014-09-11 MED ORDER — MIDAZOLAM HCL 2 MG/2ML IJ SOLN
INTRAMUSCULAR | Status: AC
  Administered 2014-09-11: 2 mg
  Filled 2014-09-11: qty 2

## 2014-09-11 MED ORDER — SODIUM CHLORIDE 0.9 % IR SOLN
Status: DC | PRN
Start: 2014-09-11 — End: 2014-09-11
  Administered 2014-09-11: 3000 mL

## 2014-09-11 MED ORDER — AMLODIPINE BESYLATE 5 MG PO TABS
5.0000 mg | ORAL_TABLET | Freq: Every day | ORAL | Status: DC
  Administered 2014-09-12 – 2014-09-13 (×2): 5 mg via ORAL
  Filled 2014-09-11 (×2): qty 1

## 2014-09-11 MED ORDER — 0.9 % SODIUM CHLORIDE (POUR BTL) OPTIME
TOPICAL | Status: DC | PRN
  Administered 2014-09-11: 1000 mL

## 2014-09-11 MED ORDER — ENOXAPARIN SODIUM 30 MG/0.3ML ~~LOC~~ SOLN
30.0000 mg | SUBCUTANEOUS | Status: DC
  Administered 2014-09-12 – 2014-09-13 (×2): 30 mg via SUBCUTANEOUS
  Filled 2014-09-11 (×2): qty 0.3

## 2014-09-11 MED ORDER — FENTANYL CITRATE (PF) 100 MCG/2ML IJ SOLN
INTRAMUSCULAR | Status: DC | PRN
  Administered 2014-09-11: 100 ug via INTRAVENOUS

## 2014-09-11 MED ORDER — CELECOXIB 200 MG PO CAPS
200.0000 mg | ORAL_CAPSULE | Freq: Two times a day (BID) | ORAL | Status: DC
  Administered 2014-09-11 – 2014-09-13 (×5): 200 mg via ORAL
  Filled 2014-09-11 (×5): qty 1

## 2014-09-11 MED ORDER — PHENOL 1.4 % MT LIQD: 1.0000 | OROMUCOSAL | Status: DC | PRN

## 2014-09-11 MED ORDER — ACETAMINOPHEN 325 MG PO TABS
650.0000 mg | ORAL_TABLET | Freq: Four times a day (QID) | ORAL | Status: DC | PRN
  Administered 2014-09-11 – 2014-09-12 (×5): 650 mg via ORAL
  Filled 2014-09-11 (×5): qty 2

## 2014-09-11 MED ORDER — CEFAZOLIN SODIUM-DEXTROSE 2-3 GM-% IV SOLR
2.0000 g | Freq: Four times a day (QID) | INTRAVENOUS | Status: AC
  Administered 2014-09-11 (×2): 2 g via INTRAVENOUS
  Filled 2014-09-11 (×2): qty 50

## 2014-09-11 MED ORDER — ROCURONIUM BROMIDE 100 MG/10ML IV SOLN
INTRAVENOUS | Status: DC | PRN
  Administered 2014-09-11: 40 mg via INTRAVENOUS

## 2014-09-11 MED ORDER — HYDROMORPHONE HCL 1 MG/ML IJ SOLN: 0.5000 mg | INTRAMUSCULAR | Status: DC | PRN

## 2014-09-11 MED ORDER — MIDAZOLAM HCL 2 MG/2ML IJ SOLN
INTRAMUSCULAR | Status: AC
  Filled 2014-09-11: qty 2

## 2014-09-11 MED ORDER — DEXAMETHASONE SODIUM PHOSPHATE 4 MG/ML IJ SOLN
INTRAMUSCULAR | Status: AC
  Filled 2014-09-11: qty 2

## 2014-09-11 MED ORDER — FENTANYL CITRATE (PF) 100 MCG/2ML IJ SOLN
INTRAMUSCULAR | Status: AC
  Administered 2014-09-11: 100 ug
  Filled 2014-09-11: qty 2

## 2014-09-11 MED ORDER — WARFARIN SODIUM 5 MG PO TABS
5.0000 mg | ORAL_TABLET | Freq: Once | ORAL | Status: AC
  Administered 2014-09-11: 5 mg via ORAL
  Filled 2014-09-11: qty 1

## 2014-09-11 MED ORDER — GLYCOPYRROLATE 0.2 MG/ML IJ SOLN
INTRAMUSCULAR | Status: DC | PRN
  Administered 2014-09-11: 0.6 mg via INTRAVENOUS

## 2014-09-11 MED ORDER — POVIDONE-IODINE 7.5 % EX SOLN
Freq: Once | CUTANEOUS | Status: DC
  Filled 2014-09-11: qty 118

## 2014-09-11 MED ORDER — WARFARIN - PHARMACIST DOSING INPATIENT
Freq: Every day | Status: DC
Start: 2014-09-11 — End: 2014-09-13
  Administered 2014-09-11: 18:00:00

## 2014-09-11 MED ORDER — PROPOFOL 10 MG/ML IV BOLUS
INTRAVENOUS | Status: DC | PRN
  Administered 2014-09-11: 20 mg via INTRAVENOUS
  Administered 2014-09-11: 150 mg via INTRAVENOUS

## 2014-09-11 MED ORDER — BENAZEPRIL HCL 20 MG PO TABS
20.0000 mg | ORAL_TABLET | Freq: Every day | ORAL | Status: DC
  Administered 2014-09-13: 20 mg via ORAL
  Filled 2014-09-11: qty 1

## 2014-09-11 MED ORDER — ONDANSETRON HCL 4 MG/2ML IJ SOLN
INTRAMUSCULAR | Status: AC
  Filled 2014-09-11: qty 2

## 2014-09-11 MED ORDER — PANTOPRAZOLE SODIUM 40 MG PO TBEC: 40.0000 mg | DELAYED_RELEASE_TABLET | Freq: Every day | ORAL | Status: DC | PRN

## 2014-09-11 MED ORDER — ONDANSETRON HCL 4 MG/2ML IJ SOLN
INTRAMUSCULAR | Status: DC | PRN
  Administered 2014-09-11: 4 mg via INTRAVENOUS

## 2014-09-11 MED ORDER — VITAMIN D 1000 UNITS PO TABS
4000.0000 [IU] | ORAL_TABLET | Freq: Every day | ORAL | Status: DC
  Administered 2014-09-11 – 2014-09-13 (×3): 4000 [IU] via ORAL
  Filled 2014-09-11 (×3): qty 4

## 2014-09-11 MED ORDER — OXYCODONE HCL 5 MG PO TABS: 5.0000 mg | ORAL_TABLET | Freq: Once | ORAL | Status: DC | PRN

## 2014-09-11 MED ORDER — LIDOCAINE HCL (CARDIAC) 20 MG/ML IV SOLN
INTRAVENOUS | Status: DC | PRN
  Administered 2014-09-11: 70 mg via INTRAVENOUS

## 2014-09-11 MED ORDER — BUPIVACAINE-EPINEPHRINE (PF) 0.25% -1:200000 IJ SOLN
INTRAMUSCULAR | Status: AC
  Filled 2014-09-11: qty 30

## 2014-09-11 MED ORDER — OXYCODONE HCL 5 MG PO TABS: 5.0000 mg | ORAL_TABLET | ORAL | Status: DC | PRN

## 2014-09-11 MED ORDER — FENTANYL CITRATE (PF) 250 MCG/5ML IJ SOLN
INTRAMUSCULAR | Status: AC
  Filled 2014-09-11: qty 5

## 2014-09-11 MED ORDER — ONDANSETRON HCL 4 MG/2ML IJ SOLN: 4.0000 mg | Freq: Four times a day (QID) | INTRAMUSCULAR | Status: DC | PRN

## 2014-09-11 MED ORDER — SIMVASTATIN 10 MG PO TABS
10.0000 mg | ORAL_TABLET | Freq: Every day | ORAL | Status: DC
  Administered 2014-09-11 – 2014-09-12 (×2): 10 mg via ORAL
  Filled 2014-09-11 (×2): qty 1

## 2014-09-11 MED ORDER — METOCLOPRAMIDE HCL 5 MG PO TABS: 5.0000 mg | ORAL_TABLET | Freq: Three times a day (TID) | ORAL | Status: DC | PRN

## 2014-09-11 SURGICAL SUPPLY — 74 items
APL SKNCLS STERI-STRIP NONHPOA (GAUZE/BANDAGES/DRESSINGS) ×1
BANDAGE ESMARK 6X9 LF (GAUZE/BANDAGES/DRESSINGS) ×1 IMPLANT
BENZOIN TINCTURE PRP APPL 2/3 (GAUZE/BANDAGES/DRESSINGS) ×2 IMPLANT
BLADE SAGITTAL 25.0X1.19X90 (BLADE) ×2 IMPLANT
BLADE SAW SGTL 11.0X1.19X90.0M (BLADE) IMPLANT
BLADE SAW SGTL 13.0X1.19X90.0M (BLADE) ×2 IMPLANT
BLADE SURG 10 STRL SS (BLADE) ×4 IMPLANT
BNDG CMPR 9X6 STRL LF SNTH (GAUZE/BANDAGES/DRESSINGS) ×1
BNDG CMPR MED 15X6 ELC VLCR LF (GAUZE/BANDAGES/DRESSINGS) ×1
BNDG ELASTIC 6X15 VLCR STRL LF (GAUZE/BANDAGES/DRESSINGS) ×2 IMPLANT
BNDG ESMARK 6X9 LF (GAUZE/BANDAGES/DRESSINGS) ×2
BOWL SMART MIX CTS (DISPOSABLE) ×2 IMPLANT
CAP KNEE TOTAL 3 SIGMA ×1 IMPLANT
CEMENT HV SMART SET (Cement) ×4 IMPLANT
COVER SURGICAL LIGHT HANDLE (MISCELLANEOUS) ×2 IMPLANT
CUFF TOURNIQUET SINGLE 34IN LL (TOURNIQUET CUFF) ×2 IMPLANT
CUFF TOURNIQUET SINGLE 44IN (TOURNIQUET CUFF) IMPLANT
DRAPE EXTREMITY T 121X128X90 (DRAPE) ×2 IMPLANT
DRAPE IMP U-DRAPE 54X76 (DRAPES) ×2 IMPLANT
DRAPE INCISE IOBAN 66X45 STRL (DRAPES) ×2 IMPLANT
DRAPE PROXIMA HALF (DRAPES) ×2 IMPLANT
DRAPE U-SHAPE 47X51 STRL (DRAPES) ×2 IMPLANT
DRSG AQUACEL AG ADV 3.5X14 (GAUZE/BANDAGES/DRESSINGS) ×2 IMPLANT
DRSG PAD ABDOMINAL 8X10 ST (GAUZE/BANDAGES/DRESSINGS) ×4 IMPLANT
DURAPREP 26ML APPLICATOR (WOUND CARE) ×4 IMPLANT
ELECT CAUTERY BLADE 6.4 (BLADE) ×2 IMPLANT
ELECT REM PT RETURN 9FT ADLT (ELECTROSURGICAL) ×2
ELECTRODE REM PT RTRN 9FT ADLT (ELECTROSURGICAL) ×1 IMPLANT
EVACUATOR 1/8 PVC DRAIN (DRAIN) ×2 IMPLANT
FACESHIELD WRAPAROUND (MASK) ×2 IMPLANT
FACESHIELD WRAPAROUND OR TEAM (MASK) ×1 IMPLANT
GAUZE SPONGE 4X4 12PLY STRL (GAUZE/BANDAGES/DRESSINGS) ×2 IMPLANT
GLOVE BIO SURGEON STRL SZ7 (GLOVE) ×2 IMPLANT
GLOVE BIOGEL PI IND STRL 6.5 (GLOVE) IMPLANT
GLOVE BIOGEL PI IND STRL 7.0 (GLOVE) ×1 IMPLANT
GLOVE BIOGEL PI IND STRL 7.5 (GLOVE) ×1 IMPLANT
GLOVE BIOGEL PI INDICATOR 6.5 (GLOVE) ×2
GLOVE BIOGEL PI INDICATOR 7.0 (GLOVE) ×2
GLOVE BIOGEL PI INDICATOR 7.5 (GLOVE) ×4
GLOVE SS BIOGEL STRL SZ 7.5 (GLOVE) ×1 IMPLANT
GLOVE SUPERSENSE BIOGEL SZ 7.5 (GLOVE) ×1
GLOVE SURG SS PI 6.5 STRL IVOR (GLOVE) ×2 IMPLANT
GLOVE SURG SS PI 7.0 STRL IVOR (GLOVE) ×1 IMPLANT
GOWN STRL REUS W/ TWL LRG LVL3 (GOWN DISPOSABLE) ×3 IMPLANT
GOWN STRL REUS W/ TWL XL LVL3 (GOWN DISPOSABLE) ×2 IMPLANT
GOWN STRL REUS W/TWL LRG LVL3 (GOWN DISPOSABLE) ×6
GOWN STRL REUS W/TWL XL LVL3 (GOWN DISPOSABLE) ×4
HANDPIECE INTERPULSE COAX TIP (DISPOSABLE) ×2
HOOD PEEL AWAY FACE SHEILD DIS (HOOD) ×4 IMPLANT
IMMOBILIZER KNEE 22 UNIV (SOFTGOODS) IMPLANT
KIT BASIN OR (CUSTOM PROCEDURE TRAY) ×2 IMPLANT
KIT ROOM TURNOVER OR (KITS) ×2 IMPLANT
MANIFOLD NEPTUNE II (INSTRUMENTS) ×2 IMPLANT
MARKER SKIN DUAL TIP RULER LAB (MISCELLANEOUS) ×2 IMPLANT
NS IRRIG 1000ML POUR BTL (IV SOLUTION) ×2 IMPLANT
PACK TOTAL JOINT (CUSTOM PROCEDURE TRAY) ×2 IMPLANT
PACK UNIVERSAL I (CUSTOM PROCEDURE TRAY) ×2 IMPLANT
PAD ARMBOARD 7.5X6 YLW CONV (MISCELLANEOUS) ×4 IMPLANT
PADDING CAST COTTON 6X4 STRL (CAST SUPPLIES) ×2 IMPLANT
RUBBERBAND STERILE (MISCELLANEOUS) ×2 IMPLANT
SET HNDPC FAN SPRY TIP SCT (DISPOSABLE) ×1 IMPLANT
STRIP CLOSURE SKIN 1/2X4 (GAUZE/BANDAGES/DRESSINGS) ×2 IMPLANT
SUCTION FRAZIER TIP 10 FR DISP (SUCTIONS) ×2 IMPLANT
SUT ETHIBOND NAB CT1 #1 30IN (SUTURE) ×4 IMPLANT
SUT MNCRL AB 3-0 PS2 18 (SUTURE) ×2 IMPLANT
SUT VIC AB 0 CT1 27 (SUTURE) ×4
SUT VIC AB 0 CT1 27XBRD ANBCTR (SUTURE) ×2 IMPLANT
SUT VIC AB 2-0 CT1 27 (SUTURE) ×4
SUT VIC AB 2-0 CT1 TAPERPNT 27 (SUTURE) ×2 IMPLANT
SYR 30ML SLIP (SYRINGE) ×2 IMPLANT
TOWEL OR 17X24 6PK STRL BLUE (TOWEL DISPOSABLE) ×2 IMPLANT
TOWEL OR 17X26 10 PK STRL BLUE (TOWEL DISPOSABLE) ×2 IMPLANT
TRAY FOLEY CATH 16FR SILVER (SET/KITS/TRAYS/PACK) ×2 IMPLANT
WATER STERILE IRR 1000ML POUR (IV SOLUTION) ×4 IMPLANT

## 2014-09-11 NOTE — Interval H&P Note (Signed)
History and Physical Interval Note:  09/11/2014 8:56 AM  Bonnie Zhang  has presented today for surgery, with the diagnosis of LEFT KNEE PRIMARY LOCALIZED OA  The various methods of treatment have been discussed with the patient and family. After consideration of risks, benefits and other options for treatment, the patient has consented to  Procedure(s): TOTAL KNEE ARTHROPLASTY (Left) as a surgical intervention .  The patient's history has been reviewed, patient examined, no change in status, stable for surgery.  I have reviewed the patient's chart and labs.  Questions were answered to the patient's satisfaction.     Elsie Saas A

## 2014-09-11 NOTE — Progress Notes (Signed)
ANTICOAGULATION CONSULT NOTE - Initial Consult  Pharmacy Consult for Warfarin Indication: VTE prophylaxis  Allergies  Allergen Reactions  . Vicodin [Hydrocodone-Acetaminophen]     Nightmares  . Aspirin Other (See Comments)    upset stomach  . Atorvastatin Other (See Comments)    leg pain  . Hctz [Hydrochlorothiazide] Other (See Comments)    FATIGUE AND EXTREMELY LOW POTASSIUM    Patient Measurements: Height: 5' (152.4 cm) Weight: 183 lb 6.4 oz (83.19 kg) IBW/kg (Calculated) : 45.5 Heparin Dosing Weight:   Vital Signs: Temp: 98 F (36.7 C) (06/13 1426) BP: 135/58 mmHg (06/13 1426) Pulse Rate: 68 (06/13 1426)  Labs:  Recent Labs  09/11/14 0726  LABPROT 14.3  INR 1.09    Estimated Creatinine Clearance: 59.9 mL/min (by C-G formula based on Cr of 0.8).   Medical History: Past Medical History  Diagnosis Date  . High cholesterol     takes Simvastating daily  . Vitamin D deficiency     takes VIt D daily  . DVT (deep vein thrombosis) in pregnancy      1965 post C section;2001 with prolonged driving while on  HRT  . Osteoporosis   . Hypertension     takes Benazepril,Amlodipine,and Metoprolol daily  . Hx of migraines     yrs ago   . Bruises easily     pt is on Coumadin;last dose of Coumadin 08/05/11 and then Lovenox started 08/07/11  . Hx of colonic polyps     Dr Sharlett Iles  . PVD (peripheral vascular disease) 1965, 2001    abnormal venous doppler findings due to recurrent DVT'S left leg  . Right knee DJD     knees  . Primary localized osteoarthritis of left knee   . Anticoagulated on Coumadin 2001    2 blood clots  . History of colon polyps 2007    Dr Sharlett Iles  . PONV (postoperative nausea and vomiting)   . Shortness of breath dyspnea   . DVT (deep venous thrombosis) 2001    Was on Prempro.    Marland Kitchen GERD (gastroesophageal reflux disease)     Protonix prn    Medications:  Prescriptions prior to admission  Medication Sig Dispense Refill Last Dose  .  acetaminophen (TYLENOL) 500 MG tablet Take 1,000 mg by mouth every 8 (eight) hours as needed for mild pain or moderate pain.   Past Month at Unknown time  . amLODipine (NORVASC) 5 MG tablet Take 1 tablet (5 mg total) by mouth daily. 90 tablet 1 09/11/2014 at Unknown time  . benazepril (LOTENSIN) 20 MG tablet Take 1 tablet (20 mg total) by mouth daily. 90 tablet 1 09/10/2014 at Unknown time  . Cholecalciferol 2000 UNITS CAPS Take 2,000 capsules by mouth daily.   09/10/2014 at Unknown time  . metoprolol tartrate (LOPRESSOR) 25 MG tablet 1 qd (Patient taking differently: Take 25 mg by mouth daily. 1 qd) 90 tablet 1 09/11/2014 at Unknown time  . pantoprazole (PROTONIX) 40 MG tablet Take 40 mg by mouth daily as needed. Acid reflux   09/11/2014 at Unknown time  . simvastatin (ZOCOR) 10 MG tablet TAKE ONE TABLET BY MOUTH AT BEDTIME 90 tablet 1 09/10/2014 at Unknown time  . warfarin (COUMADIN) 5 MG tablet Take 0.5-1 tablets (2.5-5 mg total) by mouth daily. 5mg  Monday, Wed, and Friday, except 2.5 mg on all other days. 90 tablet 1 09/06/14   Scheduled:  . [START ON 09/12/2014] amLODipine  5 mg Oral Daily  . [START ON 09/13/2014] benazepril  20 mg Oral Daily  .  ceFAZolin (ANCEF) IV  2 g Intravenous Q6H  . celecoxib  200 mg Oral Q12H  . cholecalciferol  4,000 Units Oral Daily  . dexamethasone  10 mg Intravenous Q8H  . docusate sodium  100 mg Oral BID  . [START ON 09/12/2014] enoxaparin (LOVENOX) injection  30 mg Subcutaneous Q24H  . [START ON 09/12/2014] metoprolol tartrate  25 mg Oral Daily  . polyethylene glycol  17 g Oral BID  . simvastatin  10 mg Oral QHS   Infusions:  . 0.9 % NaCl with KCl 20 mEq / L      Assessment: 74yo female with history of DVT on warfarin PTA, HLD, GERD, and osteoarthritis presents for total knee arthroplasty. Pharmacy is consulted to dose warfarin for DVT prophylaxis. INR is 1.09. Will start LMWH prophylaxis in the morning as well.  PTA Warfarin dose: 5mg  MWF and 2.5mg  AODs and last  dose was 09/06/14  Goal of Therapy:  INR 2-3 Monitor platelets by anticoagulation protocol: Yes   Plan:  Warfarin 5mg  tonight x1 Daily INR Continue to monitor H&H and platelets  Andrey Cota. Diona Foley, PharmD Clinical Pharmacist Pager [phone] 09/11/2014,3:00 PM

## 2014-09-11 NOTE — Anesthesia Procedure Notes (Addendum)
Anesthesia Regional Block:  Adductor canal block  Pre-Anesthetic Checklist: ,, timeout performed, Correct Patient, Correct Site, Correct Laterality, Correct Procedure, Correct Position, site marked, Risks and benefits discussed,  Surgical consent,  Pre-op evaluation,  At surgeon's request and post-op pain management  Laterality: Left  Prep: chloraprep       Needles:  Injection technique: Single-shot  Needle Type: Echogenic Needle     Needle Length: 9cm 9 cm Needle Gauge: 21 and 21 G    Additional Needles:  Procedures: ultrasound guided (picture in chart) Adductor canal block Narrative:  Start time: 09/11/2014 8:22 AM End time: 09/11/2014 8:32 AM Injection made incrementally with aspirations every 5 mL.  Performed by: Personally  Anesthesiologist: HODIERNE, ADAM  Additional Notes: Pt tolerated the procedure well.   Procedure Name: Intubation Date/Time: 09/11/2014 9:10 AM Performed by: Terrill Mohr Pre-anesthesia Checklist: Emergency Drugs available, Patient identified, Suction available and Patient being monitored Patient Re-evaluated:Patient Re-evaluated prior to inductionOxygen Delivery Method: Circle system utilized Preoxygenation: Pre-oxygenation with 100% oxygen Intubation Type: IV induction Ventilation: Mask ventilation without difficulty Laryngoscope Size: Mac and 3 Grade View: Grade III Tube type: Oral Tube size: 7.5 mm Number of attempts: 1 Airway Equipment and Method: Stylet (folded bvath blanket under upper back) Placement Confirmation: ETT inserted through vocal cords under direct vision,  breath sounds checked- equal and bilateral and positive ETCO2 (saw lower third of cords only) Secured at: 21 (cm at teeth) cm Tube secured with: Tape Dental Injury: Teeth and Oropharynx as per pre-operative assessment  Comments: Cords slightly anterior

## 2014-09-11 NOTE — Transfer of Care (Signed)
Immediate Anesthesia Transfer of Care Note  Patient: Bonnie Zhang  Procedure(s) Performed: Procedure(s): TOTAL KNEE ARTHROPLASTY (Left)  Patient Location: PACU  Anesthesia Type:General  Level of Consciousness: awake, alert , oriented and patient cooperative  Airway & Oxygen Therapy: Patient Spontanous Breathing, Patient connected to nasal cannula oxygen, Patient connected to face mask oxygen and sats low for a few minutes.  bsbe without adventitious sounds.  Post-op Assessment: Report given to RN and Post -op Vital signs reviewed and stable  Post vital signs: Reviewed and stable  Last Vitals:  Filed Vitals:   09/11/14 1115  BP:   Pulse:   Temp: 36.5 C  Resp:     Complications: No apparent anesthesia complications

## 2014-09-11 NOTE — Anesthesia Preprocedure Evaluation (Signed)
Anesthesia Evaluation  Patient identified by MRN, date of birth, ID band Patient awake    Reviewed: Allergy & Precautions, NPO status , Patient's Chart, lab work & pertinent test results  History of Anesthesia Complications (+) PONV and history of anesthetic complications  Airway Mallampati: II   Neck ROM: full    Dental   Pulmonary shortness of breath,  breath sounds clear to auscultation        Cardiovascular hypertension, + Peripheral Vascular Disease and DVT Rhythm:regular Rate:Normal     Neuro/Psych    GI/Hepatic GERD-  ,  Endo/Other  obese  Renal/GU      Musculoskeletal  (+) Arthritis -,   Abdominal   Peds  Hematology   Anesthesia Other Findings   Reproductive/Obstetrics                             Anesthesia Physical Anesthesia Plan  ASA: II  Anesthesia Plan: General and Regional   Post-op Pain Management: MAC Combined w/ Regional for Post-op pain   Induction: Intravenous  Airway Management Planned: Oral ETT  Additional Equipment:   Intra-op Plan:   Post-operative Plan: Extubation in OR  Informed Consent: I have reviewed the patients History and Physical, chart, labs and discussed the procedure including the risks, benefits and alternatives for the proposed anesthesia with the patient or authorized representative who has indicated his/her understanding and acceptance.     Plan Discussed with: CRNA, Anesthesiologist and Surgeon  Anesthesia Plan Comments:         Anesthesia Quick Evaluation

## 2014-09-11 NOTE — Addendum Note (Signed)
Addendum  created 09/11/14 1402 by Terrill Mohr, CRNA   Modules edited: Anesthesia Attestations

## 2014-09-11 NOTE — Op Note (Signed)
MRN:     [id number] DOB/AGE:    1941/01/27 / 74 y.o.       OPERATIVE REPORT    DATE OF PROCEDURE:  09/11/2014       PREOPERATIVE DIAGNOSIS:   LEFT KNEE PRIMARY LOCALIZED OA      Estimated body mass index is 35.82 kg/(m^2) as calculated from the following:   Height as of this encounter: 5' (1.524 m).   Weight as of this encounter: 83.19 kg (183 lb 6.4 oz).                                                        POSTOPERATIVE DIAGNOSIS:   SAME                                                                      PROCEDURE:  Procedure(s): TOTAL KNEE ARTHROPLASTY Using Depuy Sigma RP implants #2 Femur, #2.5Tibia, 12.80mm sigma RP bearing, 32 Patella     SURGEON: Adell Koval A    ASSISTANT:  Kirstin Shepperson PA-C   (Present and scrubbed throughout the case, critical for assistance with exposure, retraction, instrumentation, and closure.)         ANESTHESIA: GET with Femoral Nerve Block  DRAINS: foley, 2 medium hemovac in knee   TOURNIQUET TIME: 74JOI   COMPLICATIONS:  None     SPECIMENS: None   INDICATIONS FOR PROCEDURE: The patient has  LEFT KNEE DEGENERATIVE JOINT DISEASE, varus deformities, XR shows bone on bone arthritis. Patient has failed all conservative measures including anti-inflammatory medicines, narcotics, attempts at  exercise and weight loss, cortisone injections and viscosupplementation.  Risks and benefits of surgery have been discussed, questions answered.   DESCRIPTION OF PROCEDURE: The patient identified by armband, received  right femoral nerve block and IV antibiotics, in the holding area at [facility]. Patient taken to the operating room, appropriate anesthetic  monitors were attached General endotracheal anesthesia induced with  the patient in supine position, Foley catheter was inserted. Tourniquet  applied high to the operative thigh. Lateral post and foot positioner  applied to the table, the lower extremity was then prepped and draped  in usual  sterile fashion from the ankle to the tourniquet. Time-out procedure was performed. The limb was wrapped with an Esmarch bandage and the tourniquet inflated to 365 mmHg. We began the operation by making the anterior midline incision starting at handbreadth above the patella going over the patella 1 cm medial to and  4 cm distal to the tibial tubercle. Small bleeders in the skin and the  subcutaneous tissue identified and cauterized. Transverse retinaculum was incised and reflected medially and a medial parapatellar arthrotomy was accomplished. the patella was everted and theprepatellar fat pad resected. The superficial medial collateral  ligament was then elevated from anterior to posterior along the proximal  flare of the tibia and anterior half of the menisci resected. The knee was hyperflexed exposing bone on bone arthritis. Peripheral and notch osteophytes as well as the cruciate ligaments were then resected. We continued to  work our way around posteriorly along the proximal  tibia, and externally  rotated the tibia subluxing it out from underneath the femur. A McHale  retractor was placed through the notch and a lateral Hohmann retractor  placed, and we then drilled through the proximal tibia in line with the  axis of the tibia followed by an intramedullary guide rod and 2-degree  posterior slope cutting guide. The tibial cutting guide was pinned into place  allowing resection of 4 mm of bone medially and about 6 mm of bone  laterally because of her varus deformity. Satisfied with the tibial resection, we then  entered the distal femur 2 mm anterior to the PCL origin with the  intramedullary guide rod and applied the distal femoral cutting guide  set at 56mm, with 5 degrees of valgus. This was pinned along the  epicondylar axis. At this point, the distal femoral cut was accomplished without difficulty. We then sized for a #2 femoral component and pinned the guide in 3 degrees of external  rotation.The chamfer cutting guide was pinned into place. The anterior, posterior, and chamfer cuts were accomplished without difficulty followed by  the Sigma RP box cutting guide and the box cut. We also removed posterior osteophytes from the posterior femoral condyles. At this  time, the knee was brought into full extension. We checked our  extension and flexion gaps and found them symmetric at 12.41mm.  The patella thickness measured at 21 mm. We set the cutting guide at 13 and removed the posterior 8 mm  of the patella sized for 32 button and drilled the lollipop. The knee  was then once again hyperflexed exposing the proximal tibia. We sized for a #2.5 tibial base plate, applied the smokestack and the conical reamer followed by the the Delta fin keel punch. We then hammered into place the Sigma RP trial femoral component, inserted a 12.5-mm trial bearing, trial patellar button, and took the knee through range of motion from 0-130 degrees. No thumb pressure was required for patellar  tracking. At this point, all trial components were removed, a double batch of DePuy HV cement with 1500 mg of Zinacef was mixed and applied to all bony metallic mating surfaces except for the posterior condyles of the femur itself. In order, we  hammered into place the tibial tray and removed excess cement, the femoral component and removed excess cement, a 12.5-mm Sigma RP bearing  was inserted, and the knee brought to full extension with compression.  The patellar button was clamped into place, and excess cement  removed. While the cement cured the wound was irrigated out with normal saline solution pulse lavage, and medium Hemovac drains were placed.. Ligament stability and patellar tracking were checked and found to be excellent. The tourniquet was then released and hemostasis was obtained with cautery. The parapatellar arthrotomy was closed with  #1 ethibond suture. The subcutaneous tissue with 0 and 2-0 undyed   Vicryl suture, and 4-0 Monocryl.. A dressing of Xeroform,  4 x 4, dressing sponges, Webril, and Ace wrap applied. Needle and sponge count were correct times 2.The patient awakened, extubated, and taken to recovery room without difficulty. Vascular status was normal, pulses 2+ and symmetric.   Bonnie Zhang A 09/11/2014, 10:30 AM

## 2014-09-11 NOTE — Care Management (Signed)
Utilization review completed by Keshawn Fiorito N. Antara Brecheisen, RN BSN 

## 2014-09-11 NOTE — Progress Notes (Signed)
Orthopedic Tech Progress Note Patient Details:  Bonnie Zhang 1940-04-04 [id number] CPM applied to LLE with appropriate settings. OHF applied to bed.  CPM Left Knee CPM Left Knee: On Left Knee Flexion (Degrees): 90 Left Knee Extension (Degrees): 0   Asia R Thompson 09/11/2014, 12:17 PM

## 2014-09-11 NOTE — Plan of Care (Signed)
Problem: Consults Goal: Diagnosis- Total Joint Replacement Primary Total Knee     

## 2014-09-11 NOTE — Anesthesia Postprocedure Evaluation (Signed)
Anesthesia Post Note  Patient: Bonnie Zhang  Procedure(s) Performed: Procedure(s) (LRB): TOTAL KNEE ARTHROPLASTY (Left)  Anesthesia type: General  Patient location: PACU  Post pain: Pain level controlled and Adequate analgesia  Post assessment: Post-op Vital signs reviewed, Patient's Cardiovascular Status Stable, Respiratory Function Stable, Patent Airway and Pain level controlled  Last Vitals:  Filed Vitals:   09/11/14 1200  BP:   Pulse: 58  Temp:   Resp: 13    Post vital signs: Reviewed and stable  Level of consciousness: awake, alert  and oriented  Complications: No apparent anesthesia complications

## 2014-09-12 ENCOUNTER — Encounter (HOSPITAL_COMMUNITY): Payer: Self-pay | Admitting: General Practice

## 2014-09-12 LAB — CBC
HCT: 37.1 % (ref 36.0–46.0)
Hemoglobin: 12.3 g/dL (ref 12.0–15.0)
MCH: 28.5 pg (ref 26.0–34.0)
MCHC: 33.2 g/dL (ref 30.0–36.0)
MCV: 85.9 fL (ref 78.0–100.0)
Platelets: 192 10*3/uL (ref 150–400)
RBC: 4.32 MIL/uL (ref 3.87–5.11)
RDW: 12.1 % (ref 11.5–15.5)
WBC: 8.5 10*3/uL (ref 4.0–10.5)

## 2014-09-12 LAB — URINE CULTURE
Colony Count: NO GROWTH
Culture: NO GROWTH

## 2014-09-12 LAB — PROTIME-INR
INR: 1.16 (ref 0.00–1.49)
Prothrombin Time: 15 seconds (ref 11.6–15.2)

## 2014-09-12 LAB — BASIC METABOLIC PANEL
Anion gap: 8 (ref 5–15)
BUN: 7 mg/dL (ref 6–20)
CO2: 25 mmol/L (ref 22–32)
Calcium: 9 mg/dL (ref 8.9–10.3)
Chloride: 106 mmol/L (ref 101–111)
Creatinine, Ser: 0.74 mg/dL (ref 0.44–1.00)
GFR calc Af Amer: >60 mL/min (ref >60)
GFR calc non Af Amer: >60 mL/min (ref >60)
Glucose, Bld: 190 mg/dL — ABNORMAL HIGH (ref 65–99)
Potassium: 4 mmol/L (ref 3.5–5.1)
Sodium: 139 mmol/L (ref 135–145)

## 2014-09-12 MED ORDER — WARFARIN SODIUM 5 MG PO TABS
5.0000 mg | ORAL_TABLET | Freq: Once | ORAL | Status: AC
  Administered 2014-09-12: 5 mg via ORAL
  Filled 2014-09-12: qty 1

## 2014-09-12 NOTE — Evaluation (Signed)
Physical Therapy Evaluation Patient Details Name: Bonnie Zhang MRN: [id number] DOB: 1940/08/03 Today's Date: 09/12/2014   History of Present Illness  Patient is a 74 y/o female s/p L TKA. PMH includes Vit D deficiency, HTN, DVT, and PVD.  Clinical Impression  Patient presents with pain and post surgical deficits LLE s/p above surgery. Pt performing ambulation and transfers Min A guard assist. Pt will have assist from 2 granddaughters at home at d/c. Reviewed HEP. Will plan for stair negotiation in PM session as tolerated. Will continue to follow to maximize independence and mobility prior to return home.    Follow Up Recommendations Home health PT;Supervision/Assistance - 24 hour    Equipment Recommendations  None recommended by PT    Recommendations for Other Services       Precautions / Restrictions Precautions Precautions: Fall;Knee Precaution Booklet Issued: Yes (comment) Precaution Comments: Reviewed no pillow under knee and zero degree knee. Required Braces or Orthoses: Knee Immobilizer - Left Knee Immobilizer - Left: Discontinue once straight leg raise with < 10 degree lag Restrictions Weight Bearing Restrictions: Yes LLE Weight Bearing: Weight bearing as tolerated      Mobility  Bed Mobility               General bed mobility comments: Sitting in chair upon PT arrival.   Transfers Overall transfer level: Needs assistance Equipment used: Rolling walker (2 wheeled) Transfers: Sit to/from Stand Sit to Stand: Min guard         General transfer comment: Min guard for safety.   Ambulation/Gait Ambulation/Gait assistance: Min guard Ambulation Distance (Feet): 100 Feet Assistive device: Rolling walker (2 wheeled) Gait Pattern/deviations: Step-through pattern;Decreased stance time - left;Decreased step length - right;Trunk flexed   Gait velocity interpretation: Below normal speed for age/gender General Gait Details: Emphasized heel strike and knee  extension during stance phase to activate quads.  Stairs            Wheelchair Mobility    Modified Rankin (Stroke Patients Only)       Balance Overall balance assessment: Needs assistance Sitting-balance support: Feet supported;No upper extremity supported Sitting balance-Leahy Scale: Good     Standing balance support: During functional activity Standing balance-Leahy Scale: Fair                               Pertinent Vitals/Pain Pain Assessment: 0-10 Pain Score: 2  Pain Location: left knee Pain Descriptors / Indicators: Sore Pain Intervention(s): Monitored during session;Repositioned    Home Living Family/patient expects to be discharged to:: Private residence Living Arrangements: Alone;Other relatives Available Help at Discharge: Family (granddaughters will stay with pt) Type of Home: House Home Access: Stairs to enter Entrance Stairs-Rails: Right Entrance Stairs-Number of Steps: 2 Home Layout: One level Home Equipment: Walker - 2 wheels;Bedside commode;Cane - single point      Prior Function Level of Independence: Independent with assistive device(s)         Comments: Pt using SPC for community ambulation PTA.      Hand Dominance   Dominant Hand: Right    Extremity/Trunk Assessment   Upper Extremity Assessment: Defer to OT evaluation           Lower Extremity Assessment: RLE deficits/detail;LLE deficits/detail   LLE Deficits / Details: Limited AROM/strength secondary to surgery/pain.     Communication   Communication: No difficulties  Cognition Arousal/Alertness: Awake/alert Behavior During Therapy: WFL for tasks assessed/performed Overall Cognitive Status: Within  Functional Limits for tasks assessed                      General Comments      Exercises Total Joint Exercises Ankle Circles/Pumps: Both;15 reps;Seated Quad Sets: Left;10 reps;Seated Knee Flexion: Left;5 reps;Seated Goniometric ROM: 6-90  degrees knee AROM      Assessment/Plan    PT Assessment Patient needs continued PT services  PT Diagnosis Difficulty walking;Acute pain   PT Problem List Decreased strength;Pain;Decreased range of motion;Decreased activity tolerance;Decreased balance;Decreased mobility  PT Treatment Interventions Balance training;Gait training;Stair training;Functional mobility training;Therapeutic activities;Therapeutic exercise;Patient/family education   PT Goals (Current goals can be found in the Care Plan section) Acute Rehab PT Goals Patient Stated Goal: to go home tomorrow PT Goal Formulation: With patient Time For Goal Achievement: 09/26/14 Potential to Achieve Goals: Good    Frequency 7X/week   Barriers to discharge        Co-evaluation               End of Session Equipment Utilized During Treatment: Gait belt Activity Tolerance: Patient tolerated treatment well Patient left: in chair;with call bell/phone within reach Nurse Communication: Mobility status         Time: 6237-6283 PT Time Calculation (min) (ACUTE ONLY): 28 min   Charges:   PT Evaluation $Initial PT Evaluation Tier I: 1 Procedure PT Treatments $Gait Training: 8-22 mins   PT G Codes:        Tommie Dejoseph A Gavriella Hearst 09/12/2014, 10:13 AM Wray Kearns, PT, DPT [phone]

## 2014-09-12 NOTE — Evaluation (Signed)
Occupational Therapy Evaluation Patient Details Name: Bonnie Zhang MRN: [id number] DOB: [birth year] Today's Date: 09/12/2014    History of Present Illness Patient is a 74 y.o. female s/p L TKA. PMH includes Vitamin D deficiency, HTN, DVT, and PVD.   Clinical Impression   Pt s/p above. Education provided in session. Feel pt is safe to d/c home, from OT standpoint, with family available to assist.     Follow Up Recommendations  No OT follow up;Supervision - Intermittent    Equipment Recommendations  None recommended by OT    Recommendations for Other Services       Precautions / Restrictions Precautions Precautions: Fall;Knee Precaution Booklet Issued: No Precaution Comments: reviewed knee precautions Restrictions Weight Bearing Restrictions: Yes LLE Weight Bearing: Weight bearing as tolerated      Mobility Bed Mobility               General bed mobility comments: not assessed  Transfers Overall transfer level: Needs assistance Transfers: Sit to/from Stand Sit to Stand: Supervision            Balance No LOB in session. Balance not formally assessed.                               ADL Overall ADL's : Needs assistance/impaired                 Upper Body Dressing : Set up;Sitting   Lower Body Dressing: Set up;Supervision/safety;Sit to/from stand   Toilet Transfer: Supervision/safety;Ambulation;RW (chair)           Functional mobility during ADLs: Supervision/safety;Rolling walker General ADL Comments: Educated on LB dressing technique. Educated on safety such as safe footwear, use of bag on walker, rugs/items on floor, sitting for most of LB ADLs, and recommended someone be with her for tub or shower transfer. Pt planning to sponge bathe initially. Educated on tub transfer technique as well as shower transfer techniques.  Discussed use of reacher. Explained benefit of reaching down to left sock as it allows knee to bend.  Placed  pt in footsie roll (foam) at end of session.     Vision     Perception     Praxis      Pertinent Vitals/Pain Pain Assessment: 0-10 Pain Score: 1  Pain Location: left knee Pain Descriptors / Indicators: Sore Pain Intervention(s): Monitored during session     Hand Dominance Right   Extremity/Trunk Assessment Upper Extremity Assessment Upper Extremity Assessment: Overall WFL for tasks assessed   Lower Extremity Assessment Lower Extremity Assessment: Defer to PT evaluation LLE Deficits / Details: Limited AROM/strength secondary to surgery/pain. LLE Sensation:  [facility].)       Communication Communication Communication: No difficulties   Cognition Arousal/Alertness: Awake/alert Behavior During Therapy: WFL for tasks assessed/performed Overall Cognitive Status: Within Functional Limits for tasks assessed                     General Comments       Exercises       Shoulder Instructions      Home Living Family/patient expects to be discharged to:: Private residence Living Arrangements: Alone;Other relatives Available Help at Discharge: Family (granddaughters/daughter will stay with pt) Type of Home: House Home Access: Stairs to enter Entrance Stairs-Number of Steps: 2 Entrance Stairs-Rails: Right Home Layout: One level     Bathroom Shower/Tub: Tub/shower unit (family has walk-in shower) Shower/tub characteristics:  (curtain on walk-in  at family member's house)       Home Equipment: Gilford Rile - 2 wheels;Bedside commode;Cane - single point;Shower seat          Prior Functioning/Environment Level of Independence: Independent with assistive device(s) -sponge bathed at home       Comments: Pt using SPC for community ambulation PTA.     OT Diagnosis: Acute pain   OT Problem List:     OT Treatment/Interventions:      OT Goals(Current goals can be found in the care plan section)   OT Frequency:     Barriers to D/C:            Co-evaluation               End of Session Equipment Utilized During Treatment: Rolling walker CPM Left Knee CPM Left Knee: Off   Activity Tolerance: Patient tolerated treatment well Patient left: in chair;with call bell/phone within reach;with family/visitor present   Time: 3893-7342 OT Time Calculation (min): 18 min Charges:  OT General Charges $OT Visit: 1 Procedure OT Evaluation $Initial OT Evaluation Tier I: 1 Procedure G-CodesBenito Mccreedy OTR/L [phone] 09/12/2014, 12:08 PM

## 2014-09-12 NOTE — Care Management Note (Signed)
Case Management Note  Patient Details  Name: Bonnie Zhang MRN: [id number] Date of Birth: [birth year]  Subjective/Objective:                 S/p left total knee arthroplasty   Action/Plan:    Set up with [facility] for HHPT by MD office. Spoke with patient, no change in discharge plan. [employer] delivered CPM to home, patient already had rolling walker and 3N1. Patient states that family will be available to assist after discharge. Expected Discharge Date:                  Expected Discharge Plan:  [place]  In-House Referral:  NA  Discharge planning Services  CM Consult  Post Acute Care Choice:  Durable Medical Equipment, Home Health Choice offered to:  Patient  DME Arranged:  CPM DME Agency:  [employer]  HH Arranged:  PT [place] Agency:  [facility]  Status of Service:  Completed, signed off  Medicare Important Message Given:    Date Medicare IM Given:    Medicare IM give by:    Date Additional Medicare IM Given:    Additional Medicare Important Message give by:     If discussed at [place] of Stay Meetings, dates discussed:    Additional Comments:  Nila Nephew, RN 09/12/2014, 3:56 PM

## 2014-09-12 NOTE — Progress Notes (Signed)
[place] for Warfarin Indication: VTE prophylaxis  Allergies  Allergen Reactions  . Vicodin [Hydrocodone-Acetaminophen]     Nightmares  . Aspirin Other (See Comments)    upset stomach  . Atorvastatin Other (See Comments)    leg pain  . Hctz [Hydrochlorothiazide] Other (See Comments)    FATIGUE AND EXTREMELY LOW POTASSIUM    Patient Measurements: Height: 5' (152.4 cm) Weight: 183 lb 6.4 oz (83.19 kg) IBW/kg (Calculated) : 45.5    Vital Signs: Temp: 98.2 F (36.8 C) (06/14 0543) Temp Source: Oral (06/14 0543) BP: 113/77 mmHg (06/14 0543) Pulse Rate: 88 (06/14 0543)  Labs:  Recent Labs  09/11/14 0726 09/11/14 1605 09/12/14 0525  HGB  --  14.0 12.3  HCT  --  41.3 37.1  PLT  --  180 192  LABPROT 14.3  --  15.0  INR 1.09  --  1.16  CREATININE  --  0.72 0.74    Estimated Creatinine Clearance: 59.9 mL/min (by C-G formula based on Cr of 0.74).   Assessment: 74yo female with history of DVT on warfarin PTA, HLD, GERD, and osteoarthritis presents for total knee arthroplasty. Pharmacy is consulted to dose warfarin for DVT prophylaxis. INR is 1.16 after 1 dose of 5 mg. On LMWH 30 q12h.  Post op ABLA noted with Hg 14>12.3, no bleeding reported. Talked with patient about coumadin - she goes to [facility] coumadin clinic at [address] [address].  PTA Warfarin dose: 5mg  MWF and 2.5mg  AODs and last dose was 09/06/14  Goal of Therapy:  INR 2-3 Monitor platelets by anticoagulation protocol: Yes   Plan:  Repeat Warfarin 5mg  tonight x1 Daily INR LMWH 30 q12h until INR > = 2 Continue to monitor H&H and platelets  Eudelia Bunch, Pharm.D. [phone] 09/12/2014 1:12 PM

## 2014-09-12 NOTE — Progress Notes (Signed)
Subjective: 1 Day Post-Op Procedure(s) (LRB): TOTAL KNEE ARTHROPLASTY (Left) Patient reports pain as 3 on 0-10 scale.    Objective: Vital signs in last 24 hours: Temp:  [97.7 F (36.5 C)-98.7 F (37.1 C)] 98.2 F (36.8 C) (06/14 0543) Pulse Rate:  [58-88] 88 (06/14 0543) Resp:  [7-24] 15 (06/13 1426) BP: (104-138)/(51-77) 113/77 mmHg (06/14 0543) SpO2:  [83 %-98 %] 93 % (06/14 0543)  Intake/Output from previous day: 06/13 0701 - 06/14 0700 In: 2900 [I.V.:2900] Out: 3375 [Urine:3050; Drains:275; Blood:50] Intake/Output this shift:     Recent Labs  09/11/14 1605 09/12/14 0525  HGB 14.0 12.3    Recent Labs  09/11/14 1605 09/12/14 0525  WBC 9.2 8.5  RBC 4.74 4.32  HCT 41.3 37.1  PLT 180 192    Recent Labs  09/11/14 1605 09/12/14 0525  NA  --  139  K  --  4.0  CL  --  106  CO2  --  25  BUN  --  7  CREATININE 0.72 0.74  GLUCOSE  --  190*  CALCIUM  --  9.0    Recent Labs  09/11/14 0726 09/12/14 0525  INR 1.09 1.16    ABD soft Neurovascular intact Sensation intact distally Intact pulses distally Dorsiflexion/Plantar flexion intact Incision: dressing C/D/I  Assessment/Plan: 1 Day Post-Op Procedure(s) (LRB): TOTAL KNEE ARTHROPLASTY (Left)  Principal Problem:   Primary localized osteoarthritis of left knee Active Problems:   Vitamin D deficiency   Essential hypertension   DVT, HX OF   GERD (gastroesophageal reflux disease)   DJD (degenerative joint disease) of knee  Advance diet Up with therapy Plan for discharge tomorrow Discharge home with home health  Linda Hedges 09/12/2014, 8:51 AM

## 2014-09-12 NOTE — Progress Notes (Signed)
Physical Therapy Treatment Patient Details Name: Bonnie Zhang MRN: [id number] DOB: October 18, 1940 Today's Date: 09/12/2014    History of Present Illness Patient is a 74 y.o. female s/p L TKA. PMH includes Vit D deficiency, HTN, DVT, and PVD.    PT Comments    Patient progressing well towards PT goals. Improving ambulation distance. Afternoon session focused on stair training. Will plan for 1 more stair session tomorrow with daughter prior to d/c. Will continue to follow to maximize independence and mobility.   Follow Up Recommendations  Home health PT;Supervision/Assistance - 24 hour     Equipment Recommendations  None recommended by PT    Recommendations for Other Services       Precautions / Restrictions Precautions Precautions: Fall;Knee Precaution Booklet Issued: Yes (comment) Precaution Comments: reviewed knee precautions Required Braces or Orthoses: Knee Immobilizer - Left Knee Immobilizer - Left: Discontinue once straight leg raise with < 10 degree lag Restrictions Weight Bearing Restrictions: Yes LLE Weight Bearing: Weight bearing as tolerated    Mobility  Bed Mobility Overal bed mobility: Needs Assistance Bed Mobility: Sit to Supine       Sit to supine: Modified independent (Device/Increase time)   General bed mobility comments: HOB flat, no rails to simulate home.   Transfers Overall transfer level: Needs assistance Equipment used: Rolling walker (2 wheeled) Transfers: Sit to/from Stand Sit to Stand: Supervision         General transfer comment: Supervision for safety.   Ambulation/Gait Ambulation/Gait assistance: Supervision Ambulation Distance (Feet): 200 Feet Assistive device: Rolling walker (2 wheeled) Gait Pattern/deviations: Step-through pattern;Decreased stance time - left;Decreased step length - right;Trunk flexed   Gait velocity interpretation: Below normal speed for age/gender General Gait Details: Emphasized heel strike and knee  extension during stance phase to activate quads.   Stairs Stairs: Yes Stairs assistance: Min guard Stair Management: Step to pattern;Forwards;One rail Right Number of Stairs: 2 General stair comments: Cues for technique.   Wheelchair Mobility    Modified Rankin (Stroke Patients Only)       Balance Overall balance assessment: Needs assistance Sitting-balance support: Feet supported;No upper extremity supported Sitting balance-Leahy Scale: Good     Standing balance support: During functional activity Standing balance-Leahy Scale: Fair                      Cognition Arousal/Alertness: Awake/alert Behavior During Therapy: WFL for tasks assessed/performed Overall Cognitive Status: Within Functional Limits for tasks assessed                      Exercises      General Comments General comments (skin integrity, edema, etc.): Pt's daughter present during session.      Pertinent Vitals/Pain Pain Assessment: No/denies pain ("just soreness") Pain Score: 1  Pain Location: left knee Pain Descriptors / Indicators: Sore Pain Intervention(s): Monitored during session;Repositioned    Home Living Family/patient expects to be discharged to:: Private residence Living Arrangements: Alone;Other relatives Available Help at Discharge: Family (granddaughters/daughter will stay with pt) Type of Home: House Home Access: Stairs to enter Entrance Stairs-Rails: Right Home Layout: One level Home Equipment: Walker - 2 wheels;Bedside commode;Cane - single point;Shower seat      Prior Function Level of Independence: Independent with assistive device(s)      Comments: Pt using SPC for community ambulation PTA.    PT Goals (current goals can now be found in the care plan section) Progress towards PT goals: Progressing toward goals  Frequency  7X/week    PT Plan Current plan remains appropriate    Co-evaluation             End of Session Equipment Utilized  During Treatment: Gait belt Activity Tolerance: Patient tolerated treatment well Patient left: in bed;with call bell/phone within reach;in CPM;with family/visitor present     Time: 1315-1340 PT Time Calculation (min) (ACUTE ONLY): 25 min  Charges:  $Gait Training: 23-37 mins                    G Codes:      [place] 09/12/2014, 2:33 PM  Wray Kearns, [place], DPT [phone]

## 2014-09-13 DIAGNOSIS — Z96652 Presence of left artificial knee joint: Secondary | ICD-10-CM | POA: Diagnosis not present

## 2014-09-13 LAB — CBC
HCT: 34.6 % — ABNORMAL LOW (ref 36.0–46.0)
Hemoglobin: 11.6 g/dL — ABNORMAL LOW (ref 12.0–15.0)
MCH: 29.1 pg (ref 26.0–34.0)
MCHC: 33.5 g/dL (ref 30.0–36.0)
MCV: 86.9 fL (ref 78.0–100.0)
Platelets: 177 10*3/uL (ref 150–400)
RBC: 3.98 MIL/uL (ref 3.87–5.11)
RDW: 12.6 % (ref 11.5–15.5)
WBC: 8.6 10*3/uL (ref 4.0–10.5)

## 2014-09-13 LAB — PROTIME-INR
INR: 1.34 (ref 0.00–1.49)
Prothrombin Time: 16.7 seconds — ABNORMAL HIGH (ref 11.6–15.2)

## 2014-09-13 LAB — BASIC METABOLIC PANEL
Anion gap: 10 (ref 5–15)
BUN: 16 mg/dL (ref 6–20)
CO2: 24 mmol/L (ref 22–32)
Calcium: 8.7 mg/dL — ABNORMAL LOW (ref 8.9–10.3)
Chloride: 105 mmol/L (ref 101–111)
Creatinine, Ser: 0.8 mg/dL (ref 0.44–1.00)
GFR calc Af Amer: >60 mL/min (ref >60)
GFR calc non Af Amer: >60 mL/min (ref >60)
Glucose, Bld: 157 mg/dL — ABNORMAL HIGH (ref 65–99)
Potassium: 4.1 mmol/L (ref 3.5–5.1)
Sodium: 139 mmol/L (ref 135–145)

## 2014-09-13 MED ORDER — ENOXAPARIN SODIUM 30 MG/0.3ML ~~LOC~~ SOLN: 30.0000 mg | SUBCUTANEOUS | Status: DC

## 2014-09-13 MED ORDER — WARFARIN SODIUM 5 MG PO TABS: 5.0000 mg | ORAL_TABLET | Freq: Once | ORAL | Status: DC

## 2014-09-13 NOTE — Progress Notes (Signed)
[place] for Warfarin Indication: VTE prophylaxis  Allergies  Allergen Reactions  . Vicodin [Hydrocodone-Acetaminophen]     Nightmares  . Aspirin Other (See Comments)    upset stomach  . Atorvastatin Other (See Comments)    leg pain  . Hctz [Hydrochlorothiazide] Other (See Comments)    FATIGUE AND EXTREMELY LOW POTASSIUM    Patient Measurements: Height: 5' (152.4 cm) Weight: 183 lb 6.4 oz (83.19 kg) IBW/kg (Calculated) : 45.5    Vital Signs: Temp: 97.9 F (36.6 C) (06/15 1100) Temp Source: Oral (06/15 1100) BP: 124/58 mmHg (06/15 1100) Pulse Rate: 61 (06/15 1100)  Labs:  Recent Labs  09/11/14 0726  09/11/14 1605 09/12/14 0525 09/13/14 0601  HGB  --   < > 14.0 12.3 11.6*  HCT  --   --  41.3 37.1 34.6*  PLT  --   --  180 192 177  LABPROT 14.3  --   --  15.0 16.7*  INR 1.09  --   --  1.16 1.34  CREATININE  --   --  0.72 0.74 0.80  < > = values in this interval not displayed.  Estimated Creatinine Clearance: 59.9 mL/min (by C-G formula based on Cr of 0.8).   Assessment: 74yo female with history of DVT on warfarin PTA, HLD, GERD, and osteoarthritis presents for total knee arthroplasty. Pharmacy is consulted to dose warfarin for DVT prophylaxis.   Post op ABLA noted with Hg 14>12.3, no bleeding reported.   PTA Warfarin dose: 5mg  MWF and 2.5mg  AODs and last dose was 09/06/14  Goal of Therapy:  INR 2-3 Monitor platelets by anticoagulation protocol: Yes   Plan:  Repeat Warfarin 5mg  tonight x1 Daily INR LMWH 30 q12h until INR > = 2 Continue to monitor H&H and platelets  [employer], Pharm.D., BCPS Clinical Pharmacist Pager [phone] 09/13/2014 12:47 PM

## 2014-09-13 NOTE — Progress Notes (Signed)
Physical Therapy Treatment Patient Details Name: Bonnie Zhang MRN: [id number] DOB: 25-Jul-1940 Today's Date: 09/13/2014    History of Present Illness Patient is a 74 y.o. female s/p L TKA. PMH includes Vit D deficiency, HTN, DVT, and PVD.    PT Comments    Pt moving well and demonstrates great awareness of safety.  Discussed care transfer and continued mobility at home.  Feel pt is ready for D/C from PT perspective.    Follow Up Recommendations  Home health PT;Supervision/Assistance - 24 hour     Equipment Recommendations  None recommended by PT    Recommendations for Other Services       Precautions / Restrictions Precautions Precautions: Knee Precaution Comments: pt able to perform SLR today and no KI used.   Required Braces or Orthoses: Knee Immobilizer - Left Knee Immobilizer - Left: Discontinue once straight leg raise with < 10 degree lag Restrictions Weight Bearing Restrictions: Yes LLE Weight Bearing: Weight bearing as tolerated    Mobility  Bed Mobility Overal bed mobility: Modified Independent                Transfers Overall transfer level: Modified independent                  Ambulation/Gait Ambulation/Gait assistance: Supervision Ambulation Distance (Feet): 300 Feet Assistive device: Rolling walker (2 wheeled) Gait Pattern/deviations: Step-through pattern;Decreased stride length     General Gait Details: cues for increased heel strike, increased L knee flexion during swing, and more fluid ambulation.     Stairs            Wheelchair Mobility    Modified Rankin (Stroke Patients Only)       Balance Overall balance assessment: No apparent balance deficits (not formally assessed)                                  Cognition Arousal/Alertness: Awake/alert Behavior During Therapy: WFL for tasks assessed/performed Overall Cognitive Status: Within Functional Limits for tasks assessed                       Exercises Total Joint Exercises Straight Leg Raises: AROM;Left;10 reps Long Arc Quad: AROM;Left;10 reps Knee Flexion: AROM;Left;10 reps Goniometric ROM: ~5 - 85    General Comments        Pertinent Vitals/Pain Pain Assessment: No/denies pain    Home Living                      Prior Function            PT Goals (current goals can now be found in the care plan section) Acute Rehab PT Goals Patient Stated Goal: Go home today PT Goal Formulation: With patient Time For Goal Achievement: 09/26/14 Potential to Achieve Goals: Good Progress towards PT goals: Progressing toward goals    Frequency  7X/week    PT Plan Current plan remains appropriate    Co-evaluation             End of Session   Activity Tolerance: Patient tolerated treatment well Patient left: in chair;with call bell/phone within reach     Time: 0820-0847 PT Time Calculation (min) (ACUTE ONLY): 27 min  Charges:  $Gait Training: 8-22 mins $Therapeutic Exercise: 8-22 mins                    G Codes:  Catarina Hartshorn, [place] 09/13/2014, 9:41 AM

## 2014-09-13 NOTE — Progress Notes (Signed)
D/C IV earlier in shift d/t leaking, reviewed D/C paperwork and pt. Given D/c paperwork and prescriptions, pt. Verbalized understanding of D/C instructions and showed no signs or symptoms of distress or discomfort. Pt. Transferred from unit by Nurse Tech and transported home by daughter.

## 2014-09-13 NOTE — Discharge Summary (Signed)
Patient ID: Bonnie Zhang MRN: [id number] DOB/AGE: Aug 23, 1940 74 y.o.  Admit date: 09/11/2014 Discharge date: 09/13/2014  Admission Diagnoses:  Principal Problem:   Primary localized osteoarthritis of left knee Active Problems:   Vitamin D deficiency   Essential hypertension   DVT, HX OF   GERD (gastroesophageal reflux disease)   DJD (degenerative joint disease) of knee   Discharge Diagnoses:  Same  Past Medical History  Diagnosis Date  . High cholesterol     takes Simvastating daily  . Vitamin D deficiency     takes VIt D daily  . DVT (deep vein thrombosis) in pregnancy      1965 post C section;2001 with prolonged driving while on  HRT  . Osteoporosis   . Hypertension     takes Benazepril,Amlodipine,and Metoprolol daily  . Hx of migraines     yrs ago   . Bruises easily     pt is on Coumadin;last dose of Coumadin 08/05/11 and then Lovenox started 08/07/11  . Hx of colonic polyps     Dr Sharlett Iles  . PVD (peripheral vascular disease) 1965, 2001    abnormal venous doppler findings due to recurrent DVT'S left leg  . Right knee DJD     knees  . Primary localized osteoarthritis of left knee   . Anticoagulated on Coumadin 2001    2 blood clots  . History of colon polyps 2007    Dr Sharlett Iles  . PONV (postoperative nausea and vomiting)   . Shortness of breath dyspnea   . DVT (deep venous thrombosis) 2001    Was on Prempro.    Marland Kitchen GERD (gastroesophageal reflux disease)     Protonix prn    Surgeries: Procedure(s): TOTAL KNEE ARTHROPLASTY on 09/11/2014   Consultants:    Discharged Condition: Improved  Hospital Course: Bonnie Zhang is an 74 y.o. female who was admitted 09/11/2014 for operative treatment ofPrimary localized osteoarthritis of left knee. Patient has severe unremitting pain that affects sleep, daily activities, and work/hobbies. After pre-op clearance the patient was taken to the operating room on 09/11/2014 and underwent  Procedure(s): TOTAL KNEE  ARTHROPLASTY.    Patient was given perioperative antibiotics: Anti-infectives    Start     Dose/Rate Route Frequency Ordered Stop   09/11/14 1500  ceFAZolin (ANCEF) IVPB 2 g/50 mL premix     2 g 100 mL/hr over 30 Minutes Intravenous Every 6 hours 09/11/14 1421 09/11/14 2157   09/11/14 0945  cefUROXime (ZINACEF) injection  Status:  Discontinued       As needed 09/11/14 0945 09/11/14 1111   09/11/14 0711  ceFAZolin (ANCEF) IVPB 2 g/50 mL premix     2 g 100 mL/hr over 30 Minutes Intravenous On call to O.R. 09/11/14 2836 09/11/14 0915       Patient was given sequential compression devices, early ambulation, and chemoprophylaxis to prevent DVT.  Patient benefited maximally from hospital stay and there were no complications.    Recent vital signs: Patient Vitals for the past 24 hrs:  BP Temp Temp src Pulse Resp SpO2  09/13/14 1100 (!) 124/58 mmHg 97.9 F (36.6 C) Oral 61 18 95 %  09/13/14 0539 (!) 124/59 mmHg 98.1 F (36.7 C) - 73 16 95 %  09/12/14 2004 (!) 116/52 mmHg 98.1 F (36.7 C) - 64 16 96 %  09/12/14 1300 (!) 117/57 mmHg 98.6 F (37 C) - 79 16 98 %     Recent laboratory studies:  Recent Labs  09/12/14 0525 09/13/14  0601  WBC 8.5 8.6  HGB 12.3 11.6*  HCT 37.1 34.6*  PLT 192 177  NA 139 139  K 4.0 4.1  CL 106 105  CO2 25 24  BUN 7 16  CREATININE 0.74 0.80  GLUCOSE 190* 157*  INR 1.16 1.34  CALCIUM 9.0 8.7*     Discharge Medications:     Medication List    TAKE these medications        acetaminophen 500 MG tablet  Commonly known as:  TYLENOL  Take 1,000 mg by mouth every 8 (eight) hours as needed for mild pain or moderate pain.     amLODipine 5 MG tablet  Commonly known as:  NORVASC  Take 1 tablet (5 mg total) by mouth daily.     benazepril 20 MG tablet  Commonly known as:  LOTENSIN  Take 1 tablet (20 mg total) by mouth daily.     Cholecalciferol 2000 UNITS Caps  Take 2,000 capsules by mouth daily.     enoxaparin 30 MG/0.3ML injection  Commonly  known as:  LOVENOX  Inject 0.3 mLs (30 mg total) into the skin daily.     metoprolol tartrate 25 MG tablet  Commonly known as:  LOPRESSOR  1 qd     pantoprazole 40 MG tablet  Commonly known as:  PROTONIX  Take 40 mg by mouth daily as needed. Acid reflux     simvastatin 10 MG tablet  Commonly known as:  ZOCOR  TAKE ONE TABLET BY MOUTH AT BEDTIME     warfarin 5 MG tablet  Commonly known as:  COUMADIN  Take 0.5-1 tablets (2.5-5 mg total) by mouth daily. 5mg  Monday, Wed, and Friday, except 2.5 mg on all other days.        Diagnostic Studies: Dg Chest 2 View  08/31/2014   CLINICAL DATA:  Two days of cough ; preoperative study  EXAM: CHEST  2 VIEW  COMPARISON:  Chest x-ray of December 23, 2013  FINDINGS: The lungs are mildly hypoinflated. The infrahilar lung markings are coarse but stable. There is no alveolar infiltrate. There is no pleural effusion. The heart and pulmonary vascularity are normal. The mediastinum is normal in width. The bony thorax is unremarkable.  IMPRESSION: There is no active cardiopulmonary disease.   Electronically Signed   By: David  Martinique M.D.   On: 08/31/2014 11:06    Disposition: 01-Home or Self Care      Discharge Instructions    CPM    Complete by:  As directed   Continuous passive motion machine (CPM):      Use the CPM from 0 to 90 for 6 hours per day.       You may break it up into 2 or 3 sessions per day.      Use CPM for 2 weeks or until you are told to stop.     Call MD / Call 911    Complete by:  As directed   If you experience chest pain or shortness of breath, CALL 911 and be transported to the hospital emergency room.  If you develope a fever above 101 F, pus (white drainage) or increased drainage or redness at the wound, or calf pain, call your surgeon's office.     Change dressing    Complete by:  As directed   Change the gauze dressing daily with sterile 4 x 4 inch gauze and apply TED hose.  DO NOT REMOVE BANDAGE OVER SURGICAL INCISION.   The [facility] WHOLE  LEG INCLUDING OVER THE WATERPROOF BANDAGE WITH SOAP AND WATER EVERY DAY.     Constipation Prevention    Complete by:  As directed   Drink plenty of fluids.  Prune juice may be helpful.  You may use a stool softener, such as Colace (over the counter) 100 mg twice a day.  Use MiraLax (over the counter) for constipation as needed.     Diet - low sodium heart healthy    Complete by:  As directed      Discharge instructions    Complete by:  As directed   INSTRUCTIONS AFTER JOINT REPLACEMENT   Remove items at home which could result in a fall. This includes throw rugs or furniture in walking pathways ICE to the affected joint every three hours while awake for 30 minutes at a time, for at least the first 3-5 days, and then as needed for pain and swelling.  Continue to use ice for pain and swelling. You may notice swelling that will progress down to the foot and ankle.  This is normal after surgery.  Elevate your leg when you are not up walking on it.   Continue to use the breathing machine you got in the hospital (incentive spirometer) which will help keep your temperature down.  It is common for your temperature to cycle up and down following surgery, especially at night when you are not up moving around and exerting yourself.  The breathing machine keeps your lungs expanded and your temperature down.   DIET:  As you were doing prior to hospitalization, we recommend a well-balanced diet.  DRESSING / WOUND CARE / SHOWERING  Keep the surgical dressing until follow up.  The dressing is water proof, so you can shower without any extra covering.  IF THE DRESSING FALLS OFF or the wound gets wet inside, change the dressing with sterile gauze.  Please use good hand washing techniques before changing the dressing.  Do not use any lotions or creams on the incision until instructed by your surgeon.    ACTIVITY  Increase activity slowly as tolerated, but follow the weight bearing instructions below.    No driving for 6 weeks or until further direction given by your physician.  You cannot drive while taking narcotics.  No lifting or carrying greater than 10 lbs. until further directed by your surgeon. Avoid periods of inactivity such as sitting longer than an hour when not asleep. This helps prevent blood clots.  You may return to work once you are authorized by your doctor.     WEIGHT BEARING   Weight bearing as tolerated with assist device (walker, cane, etc) as directed, use it as long as suggested by your surgeon or therapist, typically at least 2 weeks.   EXERCISES  Results after joint replacement surgery are often greatly improved when you follow the exercise, range of motion and muscle strengthening exercises prescribed by your doctor. Safety measures are also important to protect the joint from further injury. Any time any of these exercises cause you to have increased pain or swelling, decrease what you are doing until you are comfortable again and then slowly increase them. If you have problems or questions, call your caregiver or physical therapist for advice.   Rehabilitation is important following a joint replacement. After just a few days of immobilization, the muscles of the leg can become weakened and shrink (atrophy).  These exercises are designed to build up the tone and strength of the thigh and leg muscles  and to improve motion. Often times heat used for twenty to thirty minutes before working out will loosen up your tissues and help with improving the range of motion but do not use heat for the first two weeks following surgery (sometimes heat can increase post-operative swelling).   These exercises can be done on a training (exercise) mat, on the floor, on a table or on a bed. Use whatever works the best and is most comfortable for you.    Use music or television while you are exercising so that the exercises are a pleasant break in your day. This will make your life better  with the exercises acting as a break in your routine that you can look forward to.   Perform all exercises about fifteen times, three times per day or as directed.  You should exercise both the operative leg and the other leg as well.   Exercises include:   Quad Sets - Tighten up the muscle on the front of the thigh (Quad) and hold for 5-10 seconds.   Straight Leg Raises - With your knee straight (if you were given a brace, keep it on), lift the leg to 60 degrees, hold for 3 seconds, and slowly lower the leg.  Perform this exercise against resistance later as your leg gets stronger.  Leg Slides: Lying on your back, slowly slide your foot toward your buttocks, bending your knee up off the floor (only go as far as is comfortable). Then slowly slide your foot back down until your leg is flat on the floor again.  Angel Wings: Lying on your back spread your legs to the side as far apart as you can without causing discomfort.  Hamstring Strength:  Lying on your back, push your heel against the floor with your leg straight by tightening up the muscles of your buttocks.  Repeat, but this time bend your knee to a comfortable angle, and push your heel against the floor.  You may put a pillow under the heel to make it more comfortable if necessary.   A rehabilitation program following joint replacement surgery can speed recovery and prevent re-injury in the future due to weakened muscles. Contact your doctor or a physical therapist for more information on knee rehabilitation.    CONSTIPATION  Constipation is defined medically as fewer than three stools per week and severe constipation as less than one stool per week.  Even if you have a regular bowel pattern at home, your normal regimen is likely to be disrupted due to multiple reasons following surgery.  Combination of anesthesia, postoperative narcotics, change in appetite and fluid intake all can affect your bowels.   YOU MUST use at least one of the  following options; they are listed in order of increasing strength to get the job done.  They are all available over the counter, and you may need to use some, POSSIBLY even all of these options:    Drink plenty of fluids (prune juice may be helpful) and high fiber foods Colace 100 mg by mouth twice a day  Senokot for constipation as directed and as needed Dulcolax (bisacodyl), take with full glass of water  Miralax (polyethylene glycol) once or twice a day as needed.  If you have tried all these things and are unable to have a bowel movement in the first 3-4 days after surgery call either your surgeon or your primary doctor.    If you experience loose stools or diarrhea, hold the medications until you  stool forms back up.  If your symptoms do not get better within 1 week or if they get worse, check with your doctor.  If you experience "the worst abdominal pain ever" or develop nausea or vomiting, please contact the office immediately for further recommendations for treatment.   ITCHING:  If you experience itching with your medications, try taking only a single pain pill, or even half a pain pill at a time.  You can also use Benadryl over the counter for itching or also to help with sleep.   TED HOSE STOCKINGS:  Use stockings on both legs until for at least 2 weeks or as directed by physician office. They may be removed at night for sleeping.  MEDICATIONS:  See your medication summary on the "After Visit Summary" that nursing will review with you.  You may have some home medications which will be placed on hold until you complete the course of blood thinner medication.  It is important for you to complete the blood thinner medication as prescribed.  PRECAUTIONS:  If you experience chest pain or shortness of breath - call 911 immediately for transfer to the hospital emergency department.   If you develop a fever greater that 101 F, purulent drainage from wound, increased redness or drainage from  wound, foul odor from the wound/dressing, or calf pain - CONTACT YOUR SURGEON.                                                   FOLLOW-UP APPOINTMENTS:  If you do not already have a post-op appointment, please call the office for an appointment to be seen by your surgeon.  Guidelines for how soon to be seen are listed in your "After Visit Summary", but are typically between 1-4 weeks after surgery.  OTHER INSTRUCTIONS:   Knee Replacement:  Do not place pillow under knee, focus on keeping the knee straight while resting. CPM instructions: 0-90 degrees, 2 hours in the morning, 2 hours in the afternoon, and 2 hours in the evening. Place foam block, curve side up under heel at all times except when in CPM or when walking.  DO NOT modify, tear, cut, or change the foam block in any way.  MAKE SURE YOU:  Understand these instructions.  Get help right away if you are not doing well or get worse.    Thank you for letting us be a part of your medical care team.  It is a privilege we respect greatly.  We hope these instructions will help you stay on track for a fast and full recovery!     Do not put a pillow under the knee. Place it under the heel.    Complete by:  As directed   Place gray foam block, curve side up under heel at all times except when in CPM or when walking.  DO NOT modify, tear, cut, or change in any way the gray foam block.     Increase activity slowly as tolerated    Complete by:  As directed      TED hose    Complete by:  As directed   Use stockings (TED hose) for 2 weeks on both leg(s).  You may remove them at night for sleeping.           Follow-up Information    Follow up  with Lorn Junes, MD On 09/26/2014.   Specialty:  Orthopedic Surgery   Why:  APPT TIME 9:45 AM   Contact information:   [place] [phone]       Follow up with [facility].   Why:  They will contact you to schedule home therapy  visits.   Contact information:   [address] [place] [address] [phone]        Signed: Linda Hedges 09/13/2014, 12:59 PM

## 2014-09-14 ENCOUNTER — Ambulatory Visit (INDEPENDENT_AMBULATORY_CARE_PROVIDER_SITE_OTHER): Payer: Medicare Other | Admitting: General Practice

## 2014-09-14 ENCOUNTER — Telehealth: Payer: Self-pay | Admitting: *Deleted

## 2014-09-14 DIAGNOSIS — Z86718 Personal history of other venous thrombosis and embolism: Secondary | ICD-10-CM

## 2014-09-14 DIAGNOSIS — K219 Gastro-esophageal reflux disease without esophagitis: Secondary | ICD-10-CM | POA: Diagnosis not present

## 2014-09-14 DIAGNOSIS — E559 Vitamin D deficiency, unspecified: Secondary | ICD-10-CM | POA: Diagnosis not present

## 2014-09-14 DIAGNOSIS — Z96652 Presence of left artificial knee joint: Secondary | ICD-10-CM | POA: Diagnosis not present

## 2014-09-14 DIAGNOSIS — Z471 Aftercare following joint replacement surgery: Secondary | ICD-10-CM | POA: Diagnosis not present

## 2014-09-14 DIAGNOSIS — I10 Essential (primary) hypertension: Secondary | ICD-10-CM | POA: Diagnosis not present

## 2014-09-14 DIAGNOSIS — Z5181 Encounter for therapeutic drug level monitoring: Secondary | ICD-10-CM

## 2014-09-14 DIAGNOSIS — Z7901 Long term (current) use of anticoagulants: Secondary | ICD-10-CM | POA: Diagnosis not present

## 2014-09-14 LAB — POCT INR: INR: 1.4

## 2014-09-14 NOTE — Progress Notes (Signed)
.  lbocmh

## 2014-09-14 NOTE — Telephone Encounter (Signed)
Pt was on tcm list d/c 09/13/14 had total left knee replacement. Pt will f/u with surgeon Dr. Noemi Chapel in 2 weeks...[name]

## 2014-09-14 NOTE — Progress Notes (Signed)
Pre visit review using our clinic review tool, if applicable. No additional management support is needed unless otherwise documented below in the visit note. 

## 2014-09-18 ENCOUNTER — Ambulatory Visit (INDEPENDENT_AMBULATORY_CARE_PROVIDER_SITE_OTHER): Payer: Medicare Other | Admitting: General Practice

## 2014-09-18 DIAGNOSIS — E559 Vitamin D deficiency, unspecified: Secondary | ICD-10-CM | POA: Diagnosis not present

## 2014-09-18 DIAGNOSIS — K219 Gastro-esophageal reflux disease without esophagitis: Secondary | ICD-10-CM | POA: Diagnosis not present

## 2014-09-18 DIAGNOSIS — Z471 Aftercare following joint replacement surgery: Secondary | ICD-10-CM | POA: Diagnosis not present

## 2014-09-18 DIAGNOSIS — Z5181 Encounter for therapeutic drug level monitoring: Secondary | ICD-10-CM

## 2014-09-18 DIAGNOSIS — I10 Essential (primary) hypertension: Secondary | ICD-10-CM | POA: Diagnosis not present

## 2014-09-18 DIAGNOSIS — Z86718 Personal history of other venous thrombosis and embolism: Secondary | ICD-10-CM

## 2014-09-18 DIAGNOSIS — Z96652 Presence of left artificial knee joint: Secondary | ICD-10-CM | POA: Diagnosis not present

## 2014-09-18 DIAGNOSIS — Z7901 Long term (current) use of anticoagulants: Secondary | ICD-10-CM | POA: Diagnosis not present

## 2014-09-18 LAB — POCT INR: INR: 2.5

## 2014-09-18 NOTE — Progress Notes (Signed)
Pre visit review using our clinic review tool, if applicable. No additional management support is needed unless otherwise documented below in the visit note. 

## 2014-09-20 DIAGNOSIS — I10 Essential (primary) hypertension: Secondary | ICD-10-CM | POA: Diagnosis not present

## 2014-09-20 DIAGNOSIS — Z471 Aftercare following joint replacement surgery: Secondary | ICD-10-CM | POA: Diagnosis not present

## 2014-09-20 DIAGNOSIS — Z96652 Presence of left artificial knee joint: Secondary | ICD-10-CM | POA: Diagnosis not present

## 2014-09-20 DIAGNOSIS — E559 Vitamin D deficiency, unspecified: Secondary | ICD-10-CM | POA: Diagnosis not present

## 2014-09-20 DIAGNOSIS — Z7901 Long term (current) use of anticoagulants: Secondary | ICD-10-CM | POA: Diagnosis not present

## 2014-09-20 DIAGNOSIS — K219 Gastro-esophageal reflux disease without esophagitis: Secondary | ICD-10-CM | POA: Diagnosis not present

## 2014-09-22 DIAGNOSIS — I10 Essential (primary) hypertension: Secondary | ICD-10-CM | POA: Diagnosis not present

## 2014-09-22 DIAGNOSIS — Z471 Aftercare following joint replacement surgery: Secondary | ICD-10-CM | POA: Diagnosis not present

## 2014-09-22 DIAGNOSIS — Z7901 Long term (current) use of anticoagulants: Secondary | ICD-10-CM | POA: Diagnosis not present

## 2014-09-22 DIAGNOSIS — Z96652 Presence of left artificial knee joint: Secondary | ICD-10-CM | POA: Diagnosis not present

## 2014-09-22 DIAGNOSIS — E559 Vitamin D deficiency, unspecified: Secondary | ICD-10-CM | POA: Diagnosis not present

## 2014-09-22 DIAGNOSIS — K219 Gastro-esophageal reflux disease without esophagitis: Secondary | ICD-10-CM | POA: Diagnosis not present

## 2014-09-25 ENCOUNTER — Ambulatory Visit (INDEPENDENT_AMBULATORY_CARE_PROVIDER_SITE_OTHER): Payer: Medicare Other | Admitting: General Practice

## 2014-09-25 DIAGNOSIS — Z5181 Encounter for therapeutic drug level monitoring: Secondary | ICD-10-CM

## 2014-09-25 DIAGNOSIS — I10 Essential (primary) hypertension: Secondary | ICD-10-CM | POA: Diagnosis not present

## 2014-09-25 DIAGNOSIS — Z471 Aftercare following joint replacement surgery: Secondary | ICD-10-CM | POA: Diagnosis not present

## 2014-09-25 DIAGNOSIS — Z86718 Personal history of other venous thrombosis and embolism: Secondary | ICD-10-CM

## 2014-09-25 DIAGNOSIS — K219 Gastro-esophageal reflux disease without esophagitis: Secondary | ICD-10-CM | POA: Diagnosis not present

## 2014-09-25 DIAGNOSIS — E559 Vitamin D deficiency, unspecified: Secondary | ICD-10-CM | POA: Diagnosis not present

## 2014-09-25 DIAGNOSIS — Z7901 Long term (current) use of anticoagulants: Secondary | ICD-10-CM | POA: Diagnosis not present

## 2014-09-25 DIAGNOSIS — Z96652 Presence of left artificial knee joint: Secondary | ICD-10-CM | POA: Diagnosis not present

## 2014-09-25 LAB — POCT INR: INR: 2.6

## 2014-09-25 NOTE — Progress Notes (Signed)
Pre visit review using our clinic review tool, if applicable. No additional management support is needed unless otherwise documented below in the visit note. 

## 2014-09-26 DIAGNOSIS — Z96652 Presence of left artificial knee joint: Secondary | ICD-10-CM | POA: Diagnosis not present

## 2014-09-27 DIAGNOSIS — I10 Essential (primary) hypertension: Secondary | ICD-10-CM | POA: Diagnosis not present

## 2014-09-27 DIAGNOSIS — E559 Vitamin D deficiency, unspecified: Secondary | ICD-10-CM | POA: Diagnosis not present

## 2014-09-27 DIAGNOSIS — K219 Gastro-esophageal reflux disease without esophagitis: Secondary | ICD-10-CM | POA: Diagnosis not present

## 2014-09-27 DIAGNOSIS — Z96652 Presence of left artificial knee joint: Secondary | ICD-10-CM | POA: Diagnosis not present

## 2014-09-27 DIAGNOSIS — Z471 Aftercare following joint replacement surgery: Secondary | ICD-10-CM | POA: Diagnosis not present

## 2014-09-27 DIAGNOSIS — Z7901 Long term (current) use of anticoagulants: Secondary | ICD-10-CM | POA: Diagnosis not present

## 2014-09-29 DIAGNOSIS — K219 Gastro-esophageal reflux disease without esophagitis: Secondary | ICD-10-CM | POA: Diagnosis not present

## 2014-09-29 DIAGNOSIS — E559 Vitamin D deficiency, unspecified: Secondary | ICD-10-CM | POA: Diagnosis not present

## 2014-09-29 DIAGNOSIS — I10 Essential (primary) hypertension: Secondary | ICD-10-CM | POA: Diagnosis not present

## 2014-09-29 DIAGNOSIS — Z7901 Long term (current) use of anticoagulants: Secondary | ICD-10-CM | POA: Diagnosis not present

## 2014-09-29 DIAGNOSIS — Z96652 Presence of left artificial knee joint: Secondary | ICD-10-CM | POA: Diagnosis not present

## 2014-09-29 DIAGNOSIS — Z471 Aftercare following joint replacement surgery: Secondary | ICD-10-CM | POA: Diagnosis not present

## 2014-09-30 ENCOUNTER — Other Ambulatory Visit: Payer: Self-pay | Admitting: Internal Medicine

## 2014-10-03 ENCOUNTER — Other Ambulatory Visit: Payer: Self-pay

## 2014-10-03 DIAGNOSIS — I10 Essential (primary) hypertension: Secondary | ICD-10-CM | POA: Diagnosis not present

## 2014-10-03 DIAGNOSIS — Z96652 Presence of left artificial knee joint: Secondary | ICD-10-CM | POA: Diagnosis not present

## 2014-10-03 DIAGNOSIS — K219 Gastro-esophageal reflux disease without esophagitis: Secondary | ICD-10-CM | POA: Diagnosis not present

## 2014-10-03 DIAGNOSIS — Z471 Aftercare following joint replacement surgery: Secondary | ICD-10-CM | POA: Diagnosis not present

## 2014-10-03 DIAGNOSIS — E559 Vitamin D deficiency, unspecified: Secondary | ICD-10-CM | POA: Diagnosis not present

## 2014-10-03 DIAGNOSIS — Z7901 Long term (current) use of anticoagulants: Secondary | ICD-10-CM | POA: Diagnosis not present

## 2014-10-03 MED ORDER — SIMVASTATIN 10 MG PO TABS: 10.0000 mg | ORAL_TABLET | Freq: Every day | ORAL | Status: DC

## 2014-10-05 DIAGNOSIS — I10 Essential (primary) hypertension: Secondary | ICD-10-CM | POA: Diagnosis not present

## 2014-10-05 DIAGNOSIS — E559 Vitamin D deficiency, unspecified: Secondary | ICD-10-CM | POA: Diagnosis not present

## 2014-10-05 DIAGNOSIS — Z7901 Long term (current) use of anticoagulants: Secondary | ICD-10-CM | POA: Diagnosis not present

## 2014-10-05 DIAGNOSIS — Z471 Aftercare following joint replacement surgery: Secondary | ICD-10-CM | POA: Diagnosis not present

## 2014-10-05 DIAGNOSIS — Z96652 Presence of left artificial knee joint: Secondary | ICD-10-CM | POA: Diagnosis not present

## 2014-10-05 DIAGNOSIS — K219 Gastro-esophageal reflux disease without esophagitis: Secondary | ICD-10-CM | POA: Diagnosis not present

## 2014-10-09 ENCOUNTER — Other Ambulatory Visit: Payer: Self-pay | Admitting: Internal Medicine

## 2014-10-10 ENCOUNTER — Telehealth: Payer: Self-pay | Admitting: Internal Medicine

## 2014-10-10 ENCOUNTER — Ambulatory Visit: Payer: Medicare Other | Admitting: General Practice

## 2014-10-10 DIAGNOSIS — K219 Gastro-esophageal reflux disease without esophagitis: Secondary | ICD-10-CM | POA: Diagnosis not present

## 2014-10-10 DIAGNOSIS — Z86718 Personal history of other venous thrombosis and embolism: Secondary | ICD-10-CM

## 2014-10-10 DIAGNOSIS — Z96652 Presence of left artificial knee joint: Secondary | ICD-10-CM | POA: Diagnosis not present

## 2014-10-10 DIAGNOSIS — I10 Essential (primary) hypertension: Secondary | ICD-10-CM | POA: Diagnosis not present

## 2014-10-10 DIAGNOSIS — Z5181 Encounter for therapeutic drug level monitoring: Secondary | ICD-10-CM

## 2014-10-10 DIAGNOSIS — Z471 Aftercare following joint replacement surgery: Secondary | ICD-10-CM | POA: Diagnosis not present

## 2014-10-10 DIAGNOSIS — E559 Vitamin D deficiency, unspecified: Secondary | ICD-10-CM | POA: Diagnosis not present

## 2014-10-10 DIAGNOSIS — Z7901 Long term (current) use of anticoagulants: Secondary | ICD-10-CM | POA: Diagnosis not present

## 2014-10-10 LAB — POCT INR: INR: 2.8

## 2014-10-10 NOTE — Progress Notes (Signed)
I have reviewed and agree with the plan. 

## 2014-10-10 NOTE — Telephone Encounter (Signed)
Patient is requesting your call. This is regarding advanced homecare and her reporting.

## 2014-10-10 NOTE — Progress Notes (Signed)
Pre visit review using our clinic review tool, if applicable. No additional management support is needed unless otherwise documented below in the visit note. 

## 2014-10-12 DIAGNOSIS — E559 Vitamin D deficiency, unspecified: Secondary | ICD-10-CM | POA: Diagnosis not present

## 2014-10-12 DIAGNOSIS — Z471 Aftercare following joint replacement surgery: Secondary | ICD-10-CM | POA: Diagnosis not present

## 2014-10-12 DIAGNOSIS — Z7901 Long term (current) use of anticoagulants: Secondary | ICD-10-CM | POA: Diagnosis not present

## 2014-10-12 DIAGNOSIS — Z96652 Presence of left artificial knee joint: Secondary | ICD-10-CM | POA: Diagnosis not present

## 2014-10-12 DIAGNOSIS — K219 Gastro-esophageal reflux disease without esophagitis: Secondary | ICD-10-CM | POA: Diagnosis not present

## 2014-10-12 DIAGNOSIS — I10 Essential (primary) hypertension: Secondary | ICD-10-CM | POA: Diagnosis not present

## 2014-10-16 DIAGNOSIS — M25662 Stiffness of left knee, not elsewhere classified: Secondary | ICD-10-CM | POA: Diagnosis not present

## 2014-10-16 DIAGNOSIS — Z96652 Presence of left artificial knee joint: Secondary | ICD-10-CM | POA: Diagnosis not present

## 2014-10-16 DIAGNOSIS — M25562 Pain in left knee: Secondary | ICD-10-CM | POA: Diagnosis not present

## 2014-10-16 DIAGNOSIS — R262 Difficulty in walking, not elsewhere classified: Secondary | ICD-10-CM | POA: Diagnosis not present

## 2014-10-18 DIAGNOSIS — M25662 Stiffness of left knee, not elsewhere classified: Secondary | ICD-10-CM | POA: Diagnosis not present

## 2014-10-18 DIAGNOSIS — Z96652 Presence of left artificial knee joint: Secondary | ICD-10-CM | POA: Diagnosis not present

## 2014-10-18 DIAGNOSIS — M25562 Pain in left knee: Secondary | ICD-10-CM | POA: Diagnosis not present

## 2014-10-18 DIAGNOSIS — R262 Difficulty in walking, not elsewhere classified: Secondary | ICD-10-CM | POA: Diagnosis not present

## 2014-10-20 DIAGNOSIS — Z96652 Presence of left artificial knee joint: Secondary | ICD-10-CM | POA: Diagnosis not present

## 2014-10-20 DIAGNOSIS — M25662 Stiffness of left knee, not elsewhere classified: Secondary | ICD-10-CM | POA: Diagnosis not present

## 2014-10-20 DIAGNOSIS — M25562 Pain in left knee: Secondary | ICD-10-CM | POA: Diagnosis not present

## 2014-10-20 DIAGNOSIS — R262 Difficulty in walking, not elsewhere classified: Secondary | ICD-10-CM | POA: Diagnosis not present

## 2014-10-23 DIAGNOSIS — M25662 Stiffness of left knee, not elsewhere classified: Secondary | ICD-10-CM | POA: Diagnosis not present

## 2014-10-23 DIAGNOSIS — R262 Difficulty in walking, not elsewhere classified: Secondary | ICD-10-CM | POA: Diagnosis not present

## 2014-10-23 DIAGNOSIS — M25562 Pain in left knee: Secondary | ICD-10-CM | POA: Diagnosis not present

## 2014-10-23 DIAGNOSIS — Z96652 Presence of left artificial knee joint: Secondary | ICD-10-CM | POA: Diagnosis not present

## 2014-10-24 DIAGNOSIS — Z96652 Presence of left artificial knee joint: Secondary | ICD-10-CM | POA: Diagnosis not present

## 2014-10-25 DIAGNOSIS — M25562 Pain in left knee: Secondary | ICD-10-CM | POA: Diagnosis not present

## 2014-10-25 DIAGNOSIS — M25662 Stiffness of left knee, not elsewhere classified: Secondary | ICD-10-CM | POA: Diagnosis not present

## 2014-10-25 DIAGNOSIS — R262 Difficulty in walking, not elsewhere classified: Secondary | ICD-10-CM | POA: Diagnosis not present

## 2014-10-25 DIAGNOSIS — Z96652 Presence of left artificial knee joint: Secondary | ICD-10-CM | POA: Diagnosis not present

## 2014-10-27 DIAGNOSIS — Z96652 Presence of left artificial knee joint: Secondary | ICD-10-CM | POA: Diagnosis not present

## 2014-10-27 DIAGNOSIS — R262 Difficulty in walking, not elsewhere classified: Secondary | ICD-10-CM | POA: Diagnosis not present

## 2014-10-27 DIAGNOSIS — M25662 Stiffness of left knee, not elsewhere classified: Secondary | ICD-10-CM | POA: Diagnosis not present

## 2014-10-27 DIAGNOSIS — M25562 Pain in left knee: Secondary | ICD-10-CM | POA: Diagnosis not present

## 2014-10-30 DIAGNOSIS — Z96652 Presence of left artificial knee joint: Secondary | ICD-10-CM | POA: Diagnosis not present

## 2014-10-30 DIAGNOSIS — M25662 Stiffness of left knee, not elsewhere classified: Secondary | ICD-10-CM | POA: Diagnosis not present

## 2014-10-30 DIAGNOSIS — R262 Difficulty in walking, not elsewhere classified: Secondary | ICD-10-CM | POA: Diagnosis not present

## 2014-10-30 DIAGNOSIS — M25562 Pain in left knee: Secondary | ICD-10-CM | POA: Diagnosis not present

## 2014-11-01 DIAGNOSIS — M25662 Stiffness of left knee, not elsewhere classified: Secondary | ICD-10-CM | POA: Diagnosis not present

## 2014-11-01 DIAGNOSIS — Z96652 Presence of left artificial knee joint: Secondary | ICD-10-CM | POA: Diagnosis not present

## 2014-11-01 DIAGNOSIS — R262 Difficulty in walking, not elsewhere classified: Secondary | ICD-10-CM | POA: Diagnosis not present

## 2014-11-01 DIAGNOSIS — M25562 Pain in left knee: Secondary | ICD-10-CM | POA: Diagnosis not present

## 2014-11-06 ENCOUNTER — Ambulatory Visit (HOSPITAL_COMMUNITY)
Admission: RE | Admit: 2014-11-06 | Discharge: 2014-11-06 | Disposition: A | Discharge status: Home / Self Care | Payer: Medicare Other | Source: Ambulatory Visit | Attending: Cardiovascular Disease | Admitting: Cardiovascular Disease

## 2014-11-06 ENCOUNTER — Other Ambulatory Visit (HOSPITAL_COMMUNITY): Payer: Self-pay | Admitting: Orthopedic Surgery

## 2014-11-06 DIAGNOSIS — M79605 Pain in left leg: Secondary | ICD-10-CM | POA: Insufficient documentation

## 2014-11-06 DIAGNOSIS — M25662 Stiffness of left knee, not elsewhere classified: Secondary | ICD-10-CM | POA: Diagnosis not present

## 2014-11-06 DIAGNOSIS — R262 Difficulty in walking, not elsewhere classified: Secondary | ICD-10-CM | POA: Diagnosis not present

## 2014-11-06 DIAGNOSIS — M7989 Other specified soft tissue disorders: Secondary | ICD-10-CM | POA: Diagnosis not present

## 2014-11-06 DIAGNOSIS — M25562 Pain in left knee: Secondary | ICD-10-CM | POA: Diagnosis not present

## 2014-11-06 DIAGNOSIS — Z96652 Presence of left artificial knee joint: Secondary | ICD-10-CM | POA: Diagnosis not present

## 2014-11-06 DIAGNOSIS — M79662 Pain in left lower leg: Secondary | ICD-10-CM

## 2014-11-07 ENCOUNTER — Encounter (HOSPITAL_COMMUNITY): Payer: Medicare Other

## 2014-11-08 ENCOUNTER — Ambulatory Visit (INDEPENDENT_AMBULATORY_CARE_PROVIDER_SITE_OTHER): Payer: Medicare Other | Admitting: General Practice

## 2014-11-08 DIAGNOSIS — Z5181 Encounter for therapeutic drug level monitoring: Secondary | ICD-10-CM

## 2014-11-08 DIAGNOSIS — R262 Difficulty in walking, not elsewhere classified: Secondary | ICD-10-CM | POA: Diagnosis not present

## 2014-11-08 DIAGNOSIS — Z96652 Presence of left artificial knee joint: Secondary | ICD-10-CM | POA: Diagnosis not present

## 2014-11-08 DIAGNOSIS — Z7901 Long term (current) use of anticoagulants: Secondary | ICD-10-CM

## 2014-11-08 DIAGNOSIS — Z86718 Personal history of other venous thrombosis and embolism: Secondary | ICD-10-CM

## 2014-11-08 DIAGNOSIS — M25662 Stiffness of left knee, not elsewhere classified: Secondary | ICD-10-CM | POA: Diagnosis not present

## 2014-11-08 DIAGNOSIS — M25562 Pain in left knee: Secondary | ICD-10-CM | POA: Diagnosis not present

## 2014-11-08 LAB — POCT INR: INR: 2.2

## 2014-11-08 NOTE — Progress Notes (Signed)
I have reviewed and agree with the plan. 

## 2014-11-08 NOTE — Progress Notes (Signed)
Pre visit review using our clinic review tool, if applicable. No additional management support is needed unless otherwise documented below in the visit note. 

## 2014-11-10 DIAGNOSIS — R262 Difficulty in walking, not elsewhere classified: Secondary | ICD-10-CM | POA: Diagnosis not present

## 2014-11-10 DIAGNOSIS — M25562 Pain in left knee: Secondary | ICD-10-CM | POA: Diagnosis not present

## 2014-11-10 DIAGNOSIS — Z96652 Presence of left artificial knee joint: Secondary | ICD-10-CM | POA: Diagnosis not present

## 2014-11-10 DIAGNOSIS — M25662 Stiffness of left knee, not elsewhere classified: Secondary | ICD-10-CM | POA: Diagnosis not present

## 2014-11-13 DIAGNOSIS — R262 Difficulty in walking, not elsewhere classified: Secondary | ICD-10-CM | POA: Diagnosis not present

## 2014-11-13 DIAGNOSIS — M25662 Stiffness of left knee, not elsewhere classified: Secondary | ICD-10-CM | POA: Diagnosis not present

## 2014-11-13 DIAGNOSIS — Z96652 Presence of left artificial knee joint: Secondary | ICD-10-CM | POA: Diagnosis not present

## 2014-11-13 DIAGNOSIS — M25562 Pain in left knee: Secondary | ICD-10-CM | POA: Diagnosis not present

## 2014-11-15 DIAGNOSIS — M25662 Stiffness of left knee, not elsewhere classified: Secondary | ICD-10-CM | POA: Diagnosis not present

## 2014-11-15 DIAGNOSIS — Z96652 Presence of left artificial knee joint: Secondary | ICD-10-CM | POA: Diagnosis not present

## 2014-11-15 DIAGNOSIS — R262 Difficulty in walking, not elsewhere classified: Secondary | ICD-10-CM | POA: Diagnosis not present

## 2014-11-15 DIAGNOSIS — M25562 Pain in left knee: Secondary | ICD-10-CM | POA: Diagnosis not present

## 2014-11-17 DIAGNOSIS — Z96652 Presence of left artificial knee joint: Secondary | ICD-10-CM | POA: Diagnosis not present

## 2014-11-17 DIAGNOSIS — M25662 Stiffness of left knee, not elsewhere classified: Secondary | ICD-10-CM | POA: Diagnosis not present

## 2014-11-17 DIAGNOSIS — R262 Difficulty in walking, not elsewhere classified: Secondary | ICD-10-CM | POA: Diagnosis not present

## 2014-11-17 DIAGNOSIS — M25562 Pain in left knee: Secondary | ICD-10-CM | POA: Diagnosis not present

## 2014-11-19 ENCOUNTER — Other Ambulatory Visit: Payer: Self-pay | Admitting: Internal Medicine

## 2014-11-20 ENCOUNTER — Other Ambulatory Visit: Payer: Self-pay | Admitting: Emergency Medicine

## 2014-11-20 ENCOUNTER — Ambulatory Visit: Payer: Medicare Other | Admitting: Cardiovascular Disease

## 2014-11-20 DIAGNOSIS — I10 Essential (primary) hypertension: Secondary | ICD-10-CM

## 2014-11-20 DIAGNOSIS — M25662 Stiffness of left knee, not elsewhere classified: Secondary | ICD-10-CM | POA: Diagnosis not present

## 2014-11-20 DIAGNOSIS — R262 Difficulty in walking, not elsewhere classified: Secondary | ICD-10-CM | POA: Diagnosis not present

## 2014-11-20 DIAGNOSIS — M25562 Pain in left knee: Secondary | ICD-10-CM | POA: Diagnosis not present

## 2014-11-20 DIAGNOSIS — Z96652 Presence of left artificial knee joint: Secondary | ICD-10-CM | POA: Diagnosis not present

## 2014-11-20 MED ORDER — METOPROLOL TARTRATE 25 MG PO TABS: ORAL_TABLET | ORAL | Status: DC

## 2014-11-20 MED ORDER — PANTOPRAZOLE SODIUM 40 MG PO TBEC: 40.0000 mg | DELAYED_RELEASE_TABLET | Freq: Every day | ORAL | Status: DC | PRN

## 2014-11-20 MED ORDER — BENAZEPRIL HCL 20 MG PO TABS: 20.0000 mg | ORAL_TABLET | Freq: Every day | ORAL | Status: DC

## 2014-11-21 DIAGNOSIS — Z96652 Presence of left artificial knee joint: Secondary | ICD-10-CM | POA: Diagnosis not present

## 2014-11-24 DIAGNOSIS — M25662 Stiffness of left knee, not elsewhere classified: Secondary | ICD-10-CM | POA: Diagnosis not present

## 2014-11-24 DIAGNOSIS — M25562 Pain in left knee: Secondary | ICD-10-CM | POA: Diagnosis not present

## 2014-11-24 DIAGNOSIS — Z96652 Presence of left artificial knee joint: Secondary | ICD-10-CM | POA: Diagnosis not present

## 2014-11-24 DIAGNOSIS — R262 Difficulty in walking, not elsewhere classified: Secondary | ICD-10-CM | POA: Diagnosis not present

## 2014-11-28 DIAGNOSIS — R262 Difficulty in walking, not elsewhere classified: Secondary | ICD-10-CM | POA: Diagnosis not present

## 2014-11-28 DIAGNOSIS — Z96652 Presence of left artificial knee joint: Secondary | ICD-10-CM | POA: Diagnosis not present

## 2014-11-28 DIAGNOSIS — M25562 Pain in left knee: Secondary | ICD-10-CM | POA: Diagnosis not present

## 2014-11-28 DIAGNOSIS — M25662 Stiffness of left knee, not elsewhere classified: Secondary | ICD-10-CM | POA: Diagnosis not present

## 2014-11-30 DIAGNOSIS — M25562 Pain in left knee: Secondary | ICD-10-CM | POA: Diagnosis not present

## 2014-11-30 DIAGNOSIS — Z96652 Presence of left artificial knee joint: Secondary | ICD-10-CM | POA: Diagnosis not present

## 2014-11-30 DIAGNOSIS — M25662 Stiffness of left knee, not elsewhere classified: Secondary | ICD-10-CM | POA: Diagnosis not present

## 2014-11-30 DIAGNOSIS — R262 Difficulty in walking, not elsewhere classified: Secondary | ICD-10-CM | POA: Diagnosis not present

## 2014-12-05 DIAGNOSIS — M25562 Pain in left knee: Secondary | ICD-10-CM | POA: Diagnosis not present

## 2014-12-05 DIAGNOSIS — R262 Difficulty in walking, not elsewhere classified: Secondary | ICD-10-CM | POA: Diagnosis not present

## 2014-12-05 DIAGNOSIS — Z96652 Presence of left artificial knee joint: Secondary | ICD-10-CM | POA: Diagnosis not present

## 2014-12-05 DIAGNOSIS — M25662 Stiffness of left knee, not elsewhere classified: Secondary | ICD-10-CM | POA: Diagnosis not present

## 2014-12-06 ENCOUNTER — Ambulatory Visit (INDEPENDENT_AMBULATORY_CARE_PROVIDER_SITE_OTHER): Payer: Medicare Other | Admitting: General Practice

## 2014-12-06 DIAGNOSIS — Z86718 Personal history of other venous thrombosis and embolism: Secondary | ICD-10-CM | POA: Diagnosis not present

## 2014-12-06 DIAGNOSIS — Z5181 Encounter for therapeutic drug level monitoring: Secondary | ICD-10-CM | POA: Diagnosis not present

## 2014-12-06 DIAGNOSIS — Z7901 Long term (current) use of anticoagulants: Secondary | ICD-10-CM

## 2014-12-06 LAB — POCT INR: INR: 2.5

## 2014-12-06 NOTE — Progress Notes (Signed)
I have reviewed and agree with the plan. 

## 2014-12-06 NOTE — Progress Notes (Signed)
Pre visit review using our clinic review tool, if applicable. No additional management support is needed unless otherwise documented below in the visit note. 

## 2014-12-08 DIAGNOSIS — M25662 Stiffness of left knee, not elsewhere classified: Secondary | ICD-10-CM | POA: Diagnosis not present

## 2014-12-08 DIAGNOSIS — Z96652 Presence of left artificial knee joint: Secondary | ICD-10-CM | POA: Diagnosis not present

## 2014-12-08 DIAGNOSIS — R262 Difficulty in walking, not elsewhere classified: Secondary | ICD-10-CM | POA: Diagnosis not present

## 2014-12-08 DIAGNOSIS — M25562 Pain in left knee: Secondary | ICD-10-CM | POA: Diagnosis not present

## 2014-12-12 DIAGNOSIS — Z96652 Presence of left artificial knee joint: Secondary | ICD-10-CM | POA: Diagnosis not present

## 2014-12-12 DIAGNOSIS — R262 Difficulty in walking, not elsewhere classified: Secondary | ICD-10-CM | POA: Diagnosis not present

## 2014-12-12 DIAGNOSIS — M25662 Stiffness of left knee, not elsewhere classified: Secondary | ICD-10-CM | POA: Diagnosis not present

## 2014-12-12 DIAGNOSIS — M25562 Pain in left knee: Secondary | ICD-10-CM | POA: Diagnosis not present

## 2014-12-14 DIAGNOSIS — M25562 Pain in left knee: Secondary | ICD-10-CM | POA: Diagnosis not present

## 2014-12-14 DIAGNOSIS — R262 Difficulty in walking, not elsewhere classified: Secondary | ICD-10-CM | POA: Diagnosis not present

## 2014-12-14 DIAGNOSIS — Z96652 Presence of left artificial knee joint: Secondary | ICD-10-CM | POA: Diagnosis not present

## 2014-12-14 DIAGNOSIS — M25662 Stiffness of left knee, not elsewhere classified: Secondary | ICD-10-CM | POA: Diagnosis not present

## 2014-12-19 DIAGNOSIS — M25562 Pain in left knee: Secondary | ICD-10-CM | POA: Diagnosis not present

## 2014-12-26 ENCOUNTER — Encounter: Payer: Self-pay | Admitting: Cardiovascular Disease

## 2014-12-26 NOTE — Progress Notes (Signed)
Patient ID: Bonnie Zhang, female   DOB: 1940/10/04, 74 y.o.   MRN: [id number]   74 y.o.  referred for cardiac clearance 12/2013 .  Surgery with Dr Noemi Chapel left TKR cancelled 01/02/14 for UTI and change in ECG.  She had a normal myovue study in 2002 [facility]  Elevated BP and lipids on good Rx.  She had her right knee done a few years ago without complication.  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  One was 50 years ago after GYN surgery and one was 2001 ? Related to prolonged care travel  She is unaware of any diagnosis of hypercoagulable state.  She denies SSCP has chronic exertional dyspnea.  No palpitations or syncope  No recent LE swelling  CRF HtN and elevated lipids on good Rx  Anticoagulation followed by Dr Linna Darner   F/U myovue was normal with no ischemia and she had uneventful left knee surgery in June of 2016   F/U LE duplex 11/06/14 no LLE DVT  DVT 32 years ago after C section 2nd one while on estrogen and driving over 810 miles/day  I think she can come off coumadin.  Will check hypercoagulable panel in 2 weeks and f/u hematology  LE edema from knee replacement  ROS: Denies fever, malais, weight loss, blurry vision, decreased visual acuity, cough, sputum, SOB, hemoptysis, pleuritic pain, palpitaitons, heartburn, abdominal pain, melena, lower extremity edema, claudication, or rash.  All other systems reviewed and negative   General: Affect appropriate Healthy:  appears stated age 24: normal Neck supple with no adenopathy JVP normal no bruits no thyromegaly Lungs clear with no wheezing and good diaphragmatic motion Heart:  S1/S2 no murmur,rub, gallop or click PMI normal Abdomen: benighn, BS positve, no tenderness, no AAA no bruit.  No HSM or HJR Distal pulses intact with no bruits No edema Neuro non-focal Skin warm and dry Bilateral TKR;s  Left with edema and more recent   Medications Current Outpatient Prescriptions  Medication Sig Dispense Refill    . acetaminophen (TYLENOL) 500 MG tablet Take 1,000 mg by mouth every 8 (eight) hours as needed for mild pain or moderate pain.    Marland Kitchen amLODipine (NORVASC) 5 MG tablet Take 1 tablet (5 mg total) by mouth daily. 90 tablet 1  . benazepril (LOTENSIN) 20 MG tablet Take 1 tablet (20 mg total) by mouth daily. 90 tablet 1  . Cholecalciferol 2000 UNITS CAPS Take 2,000 Units by mouth daily.     . metoprolol tartrate (LOPRESSOR) 25 MG tablet Take one tablet by mouth daily. 90 tablet 1  . pantoprazole (PROTONIX) 40 MG tablet Take 1 tablet (40 mg total) by mouth daily as needed. Acid reflux 30 tablet 0  . simvastatin (ZOCOR) 10 MG tablet Take 1 tablet (10 mg total) by mouth at bedtime. 90 tablet 2  . warfarin (COUMADIN) 5 MG tablet TAKE ONE TABLET BY MOUTH ON MONDAYS, WEDNESDAYS AND FRIDAYS, THEN TAKE ONE-HALF TABLET BY MOUTH ON ALL OTHER DAYS 90 tablet 0   No current facility-administered medications for this visit.    Allergies Vicodin; Aspirin; Atorvastatin; and Hctz  Family History: Family History  Problem Relation Age of Onset  . Kidney disease Mother     ? hypertensive  . Hypertension Mother   . Deep vein thrombosis Mother     post ankle fracture  . Leukemia Father   . Colon cancer Maternal Grandmother   . Diabetes Other     cousins  . Anesthesia  problems Neg Hx   . Hypotension Neg Hx   . Malignant hyperthermia Neg Hx   . Pseudochol deficiency Neg Hx   . Stroke Neg Hx   . Heart disease Neg Hx     Social History: Social History   Social History  . Marital Status: Divorced    Spouse Name: N/A  . Number of Children: N/A  . Years of Education: N/A   Occupational History  . Not on file.   Social History Main Topics  . Smoking status: Never Smoker   . Smokeless tobacco: Never Used  . Alcohol Use: No  . Drug Use: No  . Sexual Activity: No   Other Topics Concern  . Not on file   Social History Narrative    Past Surgical History  Procedure Laterality Date  . Cesarean  section  1961/1965/1966    X 3  . Colonoscopy w/ polypectomy  2007    Dr  Sharlett Iles; due? 2012  . Cholecystectomy  1997  . Dilation and curettage of uterus  2012    uterine polyp, Dr Kennon Rounds  . Appendectomy  1965  . Esophagogastroduodenoscopy  1997  . Total knee arthroplasty  08/11/2011    Procedure: TOTAL KNEE ARTHROPLASTY;  Surgeon: Lorn Junes, MD;  Location: [facility];  Service: Orthopedics;  Laterality: Right;  DR Caspian THIS CASE  . Wisdom tooth extraction    . Hysteroscopy w/d&c N/A 08/05/2012    Procedure: DILATATION AND CURETTAGE /HYSTEROSCOPY;  Surgeon: Donnamae Jude, MD;  Location: [facility];  Service: Gynecology;  Laterality: N/A;  . Breast lumpectomy  1987  . Robotic assisted total hysterectomy with bilateral salpingo oopherectomy Right 09/14/2012    Procedure: ROBOTIC ASSISTED TOTAL HYSTERECTOMY WITH BILATERAL SALPINGO OOPHORECTOMY ,right pelvic LYMPHNODE dissection;  Surgeon: Imagene Gurney A. Alycia Rossetti, MD;  Location: [facility];  Service: Gynecology;  Laterality: Right;  . Total knee arthroplasty Left 09/11/2014  . Total knee arthroplasty Left 09/11/2014    Procedure: TOTAL KNEE ARTHROPLASTY;  Surgeon: Elsie Saas, MD;  Location: [facility];  Service: Orthopedics;  Laterality: Left;    Past Medical History  Diagnosis Date  . High cholesterol     takes Simvastating daily  . Vitamin D deficiency     takes VIt D daily  . DVT (deep vein thrombosis) in pregnancy      1965 post C section;2001 with prolonged driving while on  HRT  . Osteoporosis   . Hypertension     takes Benazepril,Amlodipine,and Metoprolol daily  . Hx of migraines     yrs ago   . Bruises easily     pt is on Coumadin;last dose of Coumadin 08/05/11 and then Lovenox started 08/07/11  . Hx of colonic polyps     Dr Sharlett Iles  . PVD (peripheral vascular disease) 1965, 2001    abnormal venous doppler findings due to recurrent DVT'S left leg  . Right knee DJD     knees  . Primary localized osteoarthritis of left  knee   . Anticoagulated on Coumadin 2001    2 blood clots  . History of colon polyps 2007    Dr Sharlett Iles  . PONV (postoperative nausea and vomiting)   . Shortness of breath dyspnea   . DVT (deep venous thrombosis) 2001    Was on Prempro.    Marland Kitchen GERD (gastroesophageal reflux disease)     Protonix prn    Electrocardiogram:  SR RBBB ? IMI inferior T wave inversion 9/15  12/28/14  SR rate 78 RBBB Inferior T wave inversions no change   Assessment and Plan Abnormal ECG:  Stable no change no symptoms and negative myovue normal EF DVT/Anticoagulation stop coumadin Hypercoag panel  F/u hematology  ASA 81 mg HTN: Well controlled.  Continue current medications and low sodium Dash type diet.   Chol:  Lab Results  Component Value Date   LDLCALC 74 05/25/2014   Edema:  Related to venous disease and TKR  PRN lasix 10 mg called in   F/U with me in 3 months

## 2014-12-28 ENCOUNTER — Ambulatory Visit (INDEPENDENT_AMBULATORY_CARE_PROVIDER_SITE_OTHER): Payer: Medicare Other | Admitting: Cardiovascular Disease

## 2014-12-28 ENCOUNTER — Telehealth: Payer: Self-pay | Admitting: Hematology and Oncology

## 2014-12-28 ENCOUNTER — Encounter: Payer: Self-pay | Admitting: Cardiovascular Disease

## 2014-12-28 VITALS — BP 130/64 | HR 78 | Ht 60.0 in | Wt 171.2 lb

## 2014-12-28 DIAGNOSIS — E782 Mixed hyperlipidemia: Secondary | ICD-10-CM | POA: Diagnosis not present

## 2014-12-28 DIAGNOSIS — I10 Essential (primary) hypertension: Secondary | ICD-10-CM

## 2014-12-28 DIAGNOSIS — D689 Coagulation defect, unspecified: Secondary | ICD-10-CM

## 2014-12-28 MED ORDER — FUROSEMIDE 20 MG PO TABS: 10.0000 mg | ORAL_TABLET | Freq: Every day | ORAL | Status: DC

## 2014-12-28 NOTE — Telephone Encounter (Signed)
new patient appt-s/w patient and gave np appt for 10/25 @ 1:45 w/Dr. Alvy Bimler.  Referring Dr. Jenkins Rouge Dx-Coag Disorder

## 2014-12-28 NOTE — Addendum Note (Signed)
Addended by: Devra Dopp E on: 12/28/2014 09:09 AM   Modules accepted: Orders

## 2014-12-28 NOTE — Patient Instructions (Addendum)
Medication Instructions:  STOP  COUMADIN START  ASPIRIN  81  MG  EVERY DAY  FUROSEMIDE  10 MG  EVERY DAY  Labwork: Your physician recommends that you return for lab work in:  IN  2 [place]  Testing/Procedures: NONE Follow-Up:Your physician recommends that you schedule a follow-up appointment in: [place] have been referred to DR Lifescape   Any Other Special Instructions Will Be Listed Below (If Applicable).

## 2014-12-29 DIAGNOSIS — Z96652 Presence of left artificial knee joint: Secondary | ICD-10-CM | POA: Diagnosis not present

## 2014-12-29 DIAGNOSIS — R262 Difficulty in walking, not elsewhere classified: Secondary | ICD-10-CM | POA: Diagnosis not present

## 2014-12-29 DIAGNOSIS — M25562 Pain in left knee: Secondary | ICD-10-CM | POA: Diagnosis not present

## 2014-12-29 DIAGNOSIS — M25662 Stiffness of left knee, not elsewhere classified: Secondary | ICD-10-CM | POA: Diagnosis not present

## 2015-01-03 ENCOUNTER — Ambulatory Visit: Payer: Medicare Other

## 2015-01-11 ENCOUNTER — Other Ambulatory Visit: Payer: Medicare Other

## 2015-01-11 DIAGNOSIS — I82409 Acute embolism and thrombosis of unspecified deep veins of unspecified lower extremity: Secondary | ICD-10-CM | POA: Diagnosis not present

## 2015-01-16 DIAGNOSIS — M25562 Pain in left knee: Secondary | ICD-10-CM | POA: Diagnosis not present

## 2015-01-23 ENCOUNTER — Telehealth: Payer: Self-pay | Admitting: Hematology and Oncology

## 2015-01-23 ENCOUNTER — Ambulatory Visit (HOSPITAL_BASED_OUTPATIENT_CLINIC_OR_DEPARTMENT_OTHER): Payer: Medicare Other

## 2015-01-23 ENCOUNTER — Ambulatory Visit (HOSPITAL_BASED_OUTPATIENT_CLINIC_OR_DEPARTMENT_OTHER): Payer: Medicare Other | Admitting: Hematology and Oncology

## 2015-01-23 ENCOUNTER — Encounter: Payer: Self-pay | Admitting: Hematology and Oncology

## 2015-01-23 VITALS — BP 154/60 | HR 75 | Temp 97.9°F | Resp 18 | Ht 60.0 in | Wt 168.9 lb

## 2015-01-23 DIAGNOSIS — E7219 Other disorders of sulfur-bearing amino-acid metabolism: Principal | ICD-10-CM

## 2015-01-23 DIAGNOSIS — D6852 Prothrombin gene mutation: Secondary | ICD-10-CM | POA: Diagnosis not present

## 2015-01-23 DIAGNOSIS — D6851 Activated protein C resistance: Secondary | ICD-10-CM

## 2015-01-23 DIAGNOSIS — I82402 Acute embolism and thrombosis of unspecified deep veins of left lower extremity: Secondary | ICD-10-CM | POA: Diagnosis not present

## 2015-01-23 DIAGNOSIS — Z86718 Personal history of other venous thrombosis and embolism: Secondary | ICD-10-CM

## 2015-01-23 DIAGNOSIS — R7983 Abnormal findings of blood amino-acid level: Secondary | ICD-10-CM

## 2015-01-23 DIAGNOSIS — E7211 Homocystinuria: Secondary | ICD-10-CM | POA: Diagnosis not present

## 2015-01-23 HISTORY — DX: Abnormal findings of blood amino-acid level: R79.83

## 2015-01-23 LAB — FOLATE: Folate: 10.5 ng/mL

## 2015-01-23 LAB — VITAMIN B12: Vitamin B-12: 259 pg/mL (ref 211–911)

## 2015-01-23 NOTE — Progress Notes (Signed)
[facility] NOTE  Patient Care Team: Hendricks Limes, MD as PCP - General Josue Hector, MD as Consulting Physician (Cardiology) Heath Lark, MD as Consulting Physician (Hematology and Oncology)  CHIEF COMPLAINTS/PURPOSE OF CONSULTATION:  History of recurrent provoked DVT, question the need for long-term anticoagulation therapy  HISTORY OF PRESENTING ILLNESS:  Bonnie Zhang 74 y.o. 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 This is a delightful patient with excellent memory. The patient first encounter a provoked DVT affecting the left lower extremity after the birth of her second daughter. The patient has 3 total pregnancies and 3 cesarean sections. Her first daughter was born in December 0071, without complications. Her second daughter was born in 73 and postoperatively was complicated by left lower extremity DVT. She was treated appropriately with anticoagulation therapy at that time. In 1966, her third daughter was born again with C-section and she underwent tubal ligation at the same time. Postoperatively, she received some sort of injections to prevent post operative thrombosis. She was doing well up until 2001. At the time, she was taking oral contraceptives/hormone replacement therapy and was doing long distance driving for approximately 6 months. She was found to have recurrence of left lower extremity thrombosis and had remained on warfarin since then. Over the past 15 years, she had numerous surgeries including 2 knees replacement surgeries, D&C, colonoscopy, cholecystectomy, hysterectomy and all of them were covered appropriately with perioperative anticoagulation management and she never developed any further thrombosis since then.  She will remain on warfarin over the past 15 years. She have chronic left lower extremity edema and multiple ultrasound venous Dopplers excluded recurrence of thrombosis. She  denies bleeding complications while on warfarin. Recently, after her left knee surgery, she was maintained on anticoagulation therapy. She was taken off warfarin over the last 6 weeks ago and underwent hypercoagulable panel on 01/11/2015. She is doing very well and is mobile after her knee surgery. She denies any acute pain on the left count. She denies any chest pain or shortness of breath. She have positive family history of thrombosis in the mother. She denies history of recurrent miscarriages. Her age appropriate screening programs are up-to-date. MEDICAL HISTORY:  Past Medical History  Diagnosis Date  . High cholesterol     takes Simvastating daily  . Vitamin D deficiency     takes VIt D daily  . DVT (deep vein thrombosis) in pregnancy      1965 post C section;2001 with prolonged driving while on  HRT  . Osteoporosis   . Hypertension     takes Benazepril,Amlodipine,and Metoprolol daily  . Hx of migraines     yrs ago   . Bruises easily     pt is on Coumadin;last dose of Coumadin 08/05/11 and then Lovenox started 08/07/11  . Hx of colonic polyps     Dr Sharlett Iles  . PVD (peripheral vascular disease) ([place]) 1965, 2001    abnormal venous doppler findings due to recurrent DVT'S left leg  . Right knee DJD     knees  . Primary localized osteoarthritis of left knee   . Anticoagulated on Coumadin 2001    2 blood clots  . History of colon polyps 2007    Dr Sharlett Iles  . PONV (postoperative nausea and vomiting)   . Shortness of breath dyspnea   . DVT (deep venous thrombosis) ([place]) 2001    Was on Prempro.    Marland Kitchen GERD (gastroesophageal reflux  disease)     Protonix prn  . 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 Cesarean section  1961/1965/1966    X 3  . Colonoscopy w/ polypectomy  2007    Dr  Sharlett Iles; due? 2012  . Cholecystectomy  1997  . Dilation and curettage of uterus  2012    uterine polyp, Dr Kennon Rounds  .  Appendectomy  1965  . Esophagogastroduodenoscopy  1997  . Total knee arthroplasty  08/11/2011    Procedure: TOTAL KNEE ARTHROPLASTY;  Surgeon: Lorn Junes, MD;  Location: [facility];  Service: Orthopedics;  Laterality: Right;  DR [place] THIS CASE  . Wisdom tooth extraction    . Hysteroscopy w/d&c N/A 08/05/2012    Procedure: DILATATION AND CURETTAGE /HYSTEROSCOPY;  Surgeon: Donnamae Jude, MD;  Location: [facility];  Service: Gynecology;  Laterality: N/A;  . Breast lumpectomy  1987  . Robotic assisted total hysterectomy with bilateral salpingo oopherectomy Right 09/14/2012    Procedure: ROBOTIC ASSISTED TOTAL HYSTERECTOMY WITH BILATERAL SALPINGO OOPHORECTOMY ,right pelvic LYMPHNODE dissection;  Surgeon: Imagene Gurney A. Alycia Rossetti, MD;  Location: [facility];  Service: Gynecology;  Laterality: Right;  . Total knee arthroplasty Left 09/11/2014  . Total knee arthroplasty Left 09/11/2014    Procedure: TOTAL KNEE ARTHROPLASTY;  Surgeon: Elsie Saas, MD;  Location: [facility];  Service: Orthopedics;  Laterality: Left;  . Abdominal hysterectomy      SOCIAL HISTORY: Social History   Social History  . Marital Status: Divorced    Spouse Name: N/A  . Number of Children: N/A  . Years of Education: N/A   Occupational History  . Not on file.   Social History Main Topics  . Smoking status: Never Smoker   . Smokeless tobacco: Never Used  . Alcohol Use: No  . Drug Use: No  . Sexual Activity: No     Comment: retired. has 2 daughters   Other Topics Concern  . Not on file   Social History Narrative    FAMILY HISTORY: Family History  Problem Relation Age of Onset  . Kidney disease Mother     ? hypertensive  . Hypertension Mother   . Deep vein thrombosis Mother     post ankle fracture  . Leukemia Father   . Colon cancer Maternal Grandmother   . Diabetes Other     cousins  . Anesthesia problems Neg Hx   . Hypotension Neg Hx   . Malignant hyperthermia Neg Hx   . Pseudochol deficiency Neg Hx    . Stroke Neg Hx   . Heart disease Neg Hx     ALLERGIES:  is allergic to vicodin; aspirin; atorvastatin; and hctz.  MEDICATIONS:  Current Outpatient Prescriptions  Medication Sig Dispense Refill  . acetaminophen (TYLENOL) 500 MG tablet Take 1,000 mg by mouth every 8 (eight) hours as needed for mild pain or moderate pain.    Marland Kitchen amLODipine (NORVASC) 5 MG tablet Take 1 tablet (5 mg total) by mouth daily. 90 tablet 1  . benazepril (LOTENSIN) 20 MG tablet Take 1 tablet (20 mg total) by mouth daily. 90 tablet 1  . Cholecalciferol 2000 UNITS CAPS Take 2,000 Units by mouth daily.     . furosemide (LASIX) 20 MG tablet Take 0.5 tablets (10 mg total) by mouth daily. 45 tablet 3  . metoprolol tartrate (LOPRESSOR) 25 MG tablet Take one tablet by mouth daily. 90 tablet 1  . pantoprazole (PROTONIX) 40 MG  tablet Take 1 tablet (40 mg total) by mouth daily as needed. Acid reflux 30 tablet 0  . simvastatin (ZOCOR) 10 MG tablet Take 1 tablet (10 mg total) by mouth at bedtime. 90 tablet 2   No current facility-administered medications for this visit.    REVIEW OF SYSTEMS:   Constitutional: Denies fevers, chills or abnormal night sweats Eyes: Denies blurriness of vision, double vision or watery eyes Ears, nose, mouth, throat, and face: Denies mucositis or sore throat Respiratory: Denies cough, dyspnea or wheezes Cardiovascular: Denies palpitation, chest discomfort or lower extremity swelling Gastrointestinal:  Denies nausea, heartburn or change in bowel habits Skin: Denies abnormal skin rashes Lymphatics: Denies new lymphadenopathy or easy bruising Neurological:Denies numbness, tingling or new weaknesses Behavioral/Psych: Mood is stable, no new changes  All other systems were reviewed with the patient and are negative.  PHYSICAL EXAMINATION: ECOG PERFORMANCE STATUS: 0 - Asymptomatic  Filed Vitals:   01/23/15 1352  BP: 154/60  Pulse: 75  Temp: 97.9 F (36.6 C)  Resp: 18   Filed Weights    01/23/15 1352  Weight: 168 lb 14.4 oz (76.613 kg)    GENERAL:alert, no distress and comfortable SKIN: skin color, texture, turgor are normal, no rashes or significant lesions EYES: normal, conjunctiva are pink and non-injected, sclera clear OROPHARYNX:no exudate, no erythema and lips, buccal mucosa, and tongue normal  NECK: supple, thyroid normal size, non-tender, without nodularity LYMPH:  no palpable lymphadenopathy in the cervical, axillary or inguinal LUNGS: clear to auscultation and percussion with normal breathing effort HEART: regular rate & rhythm and no murmurs with moderate left lower extremity edema ABDOMEN:abdomen soft, non-tender and normal bowel sounds Musculoskeletal:no cyanosis of digits and no clubbing  PSYCH: alert & oriented x 3 with fluent speech NEURO: no focal motor/sensory deficits  LABORATORY DATA:  I have reviewed the data as listed Recent Results (from the past 2160 hour(s))  POCT INR     Status: None   Collection Time: 11/08/14 12:00 AM  Result Value Ref Range   INR 2.2   POCT INR     Status: None   Collection Time: 12/06/14 12:00 AM  Result Value Ref Range   INR 2.5    ASSESSMENT & PLAN:  1)  Recurrent, 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  All the episodes of blood clot appeared to be provoked.  Even though most episodes of blood clots appeared to be provoked, with the presence of heterozygosity for factor V Leiden mutation and homocystinemia, I felt that her risk of recurrence of blood clots would be excessive in the region of 10% or so over her lifetime. The risk of bleeding from Coumadin is estimated at about 3% per year. We discussed that the goal to resume warfarin therapy is for secondary prevention only.  We discussed about various options of anticoagulation therapies including warfarin, low molecular  weight heparin such as Lovenox or newer agents such as Rivaroxaban. Some of the risks and benefits discussed including costs involved, the need for monitoring, risks of life-threatening bleeding/hospitalization, reversibility of each agent in the event of bleeding or overdose, safety profile of each drug and taking into account other social issues such as ease of administration of medications, etc. Ultimately, we have made an informed decision for the patient to continue her treatment with warfarin.  I recommend she contacts her primary care doctor  or cardiologist to resume anticoagulation therapy through their office. Goal INR will be 2-3, duration of therapy for life.  Another main issue we discussed today included the role of screening other family members for thrombophilia disorder. At present time, I would not recommend testing the patient's family members as it would not benefit them.  Thrombophilia disorder is a genetic predisposition which increases an individual's risk for a thrombotic event, NOT a disease.  We discussed the implications of genetic screening including the possibility of uninsurability, costs involved, emotional distress and possible discrimination at various levels for the affected individual.  Rather than genetic screening, one can possibly benefit from genetic counseling or dissemination of appropriate reading materials to educate other family members.  Finally, at the end of our consultation today, I reinforced the importance of preventive strategies such as avoiding hormonal supplement, avoiding cigarette smoking, keeping up-to-date with screening programs for early cancer detection, frequent ambulation for long distance travel and aggressive DVT prophylaxis in all surgical settings.  I have not made a return appointment for the patient to come back. I would be happy to assist in perioperative DVT management in the future should she need any interruption of her anticoagulation  therapy for elective procedures.  3) Homocystinemia  This could predispose her to risk of thrombosis as well. I will order serum folate and vitamin B 12 level to see if she is deficient in either vitamins. If not, she would benefit from folic acid supplement for life. I will call her with test results.   Orders Placed This Encounter  Procedures  . Vitamin B12    Standing Status: Future     Number of Occurrences:      Standing Expiration Date: 02/27/2016  . Folate, Serum    Standing Status: Future     Number of Occurrences:      Standing Expiration Date: 02/27/2016    All questions were answered. The patient knows to call the clinic with any problems, questions or concerns. I spent 40 minutes counseling the patient face to face. The total time spent in the appointment was 60 minutes and more than 50% was on counseling.     [facility], [place], MD 01/23/2015 2:43 PM

## 2015-01-23 NOTE — Telephone Encounter (Signed)
Patient sent back to lab. Per 10/25 pof no return visit at this time. Patient aware.

## 2015-01-24 ENCOUNTER — Encounter: Payer: Self-pay | Admitting: Hematology and Oncology

## 2015-01-24 ENCOUNTER — Telehealth: Payer: Self-pay | Admitting: Cardiovascular Disease

## 2015-01-24 ENCOUNTER — Telehealth: Payer: Self-pay | Admitting: Hematology and Oncology

## 2015-01-24 DIAGNOSIS — R7983 Abnormal findings of blood amino-acid level: Secondary | ICD-10-CM

## 2015-01-24 DIAGNOSIS — E7219 Other disorders of sulfur-bearing amino-acid metabolism: Principal | ICD-10-CM

## 2015-01-24 DIAGNOSIS — E538 Deficiency of other specified B group vitamins: Secondary | ICD-10-CM

## 2015-01-24 HISTORY — DX: Deficiency of other specified B group vitamins: E53.8

## 2015-01-24 MED ORDER — FOLIC ACID 1 MG PO TABS: 1.0000 mg | ORAL_TABLET | Freq: Every day | ORAL | Status: DC

## 2015-01-24 MED ORDER — VITAMIN B-12 1000 MCG PO TABS: 1000.0000 ug | ORAL_TABLET | Freq: Every day | ORAL | Status: DC

## 2015-01-24 NOTE — Telephone Encounter (Signed)
Calling wanting to know what Dr. Johnsie Cancel recommended as far as going back on Coumadin and/or  Lovenox.  Saw Dr. Alvy Bimler (Hematology-Oncology) yesterday (note is in computer).  She has been off of Coumadin since 9/29.  She was seen in coumadin clinic at [facility] on [address] with Dr. Clayborn Heron office. Advised Dr. Johnsie Cancel not in office today but will forward message to him for review and recommendations.

## 2015-01-24 NOTE — Telephone Encounter (Signed)
Dr. Johnsie Cancel had forwarded Dr. Calton Dach OV note to me.  Plan to restart Coumadin.  Spoke with pt.  Will restart previous dose of 2.5mg  daily except 5mg  on Monday, Wednesday and Friday.  Will need INR check within 7 days.  Pt had been following with PCP at the [facility] office.  She would like to continue this due to copay difference.  Will send message to Villa Herb, RN with the PCP office to set up an appt next week.  Pt is agreeable to plan.

## 2015-01-24 NOTE — Telephone Encounter (Signed)
Review of the test results with her. Her vitamin B-12 tests came back borderline low. For homocystinemia, she would benefit from folic acid 1 mg daily supplements. With her borderline low vitamin B-12 level, I recommend she takes 1000 g vitamin B-12 by mouth daily. The duration of treatment would be indefinite. I addressed all her questions.

## 2015-01-24 NOTE — Telephone Encounter (Signed)
New problem   Pt want to know her next step in care since she saw her Hematology doctor yesterday,.

## 2015-01-31 ENCOUNTER — Ambulatory Visit (INDEPENDENT_AMBULATORY_CARE_PROVIDER_SITE_OTHER): Payer: Medicare Other | Admitting: General Practice

## 2015-01-31 DIAGNOSIS — Z7901 Long term (current) use of anticoagulants: Secondary | ICD-10-CM | POA: Diagnosis not present

## 2015-01-31 DIAGNOSIS — Z86718 Personal history of other venous thrombosis and embolism: Secondary | ICD-10-CM

## 2015-01-31 DIAGNOSIS — Z5181 Encounter for therapeutic drug level monitoring: Secondary | ICD-10-CM | POA: Diagnosis not present

## 2015-01-31 LAB — POCT INR: INR: 1.2

## 2015-01-31 NOTE — Progress Notes (Signed)
I have reviewed and agree with the plan. 

## 2015-01-31 NOTE — Progress Notes (Signed)
Pre visit review using our clinic review tool, if applicable. No additional management support is needed unless otherwise documented below in the visit note. 

## 2015-02-07 ENCOUNTER — Ambulatory Visit (INDEPENDENT_AMBULATORY_CARE_PROVIDER_SITE_OTHER): Payer: Medicare Other | Admitting: General Practice

## 2015-02-07 ENCOUNTER — Other Ambulatory Visit: Payer: Self-pay | Admitting: General Practice

## 2015-02-07 DIAGNOSIS — Z7901 Long term (current) use of anticoagulants: Secondary | ICD-10-CM | POA: Diagnosis not present

## 2015-02-07 DIAGNOSIS — Z86718 Personal history of other venous thrombosis and embolism: Secondary | ICD-10-CM | POA: Diagnosis not present

## 2015-02-07 DIAGNOSIS — Z5181 Encounter for therapeutic drug level monitoring: Secondary | ICD-10-CM | POA: Diagnosis not present

## 2015-02-07 LAB — POCT INR: INR: 2

## 2015-02-07 MED ORDER — WARFARIN SODIUM 5 MG PO TABS: ORAL_TABLET | ORAL | Status: DC

## 2015-02-07 NOTE — Progress Notes (Signed)
Pre visit review using our clinic review tool, if applicable. No additional management support is needed unless otherwise documented below in the visit note. 

## 2015-02-07 NOTE — Progress Notes (Signed)
I have reviewed and agree with the plan. 

## 2015-02-16 ENCOUNTER — Other Ambulatory Visit: Payer: Self-pay | Admitting: Internal Medicine

## 2015-02-28 ENCOUNTER — Ambulatory Visit (INDEPENDENT_AMBULATORY_CARE_PROVIDER_SITE_OTHER): Payer: Medicare Other | Admitting: General Practice

## 2015-02-28 DIAGNOSIS — Z86718 Personal history of other venous thrombosis and embolism: Secondary | ICD-10-CM | POA: Diagnosis not present

## 2015-02-28 DIAGNOSIS — Z7901 Long term (current) use of anticoagulants: Secondary | ICD-10-CM

## 2015-02-28 LAB — POCT INR: INR: 2.2

## 2015-02-28 NOTE — Progress Notes (Signed)
Pre visit review using our clinic review tool, if applicable. No additional management support is needed unless otherwise documented below in the visit note. 

## 2015-02-28 NOTE — Progress Notes (Signed)
I have reviewed and agree with the plan. 

## 2015-03-08 ENCOUNTER — Ambulatory Visit (INDEPENDENT_AMBULATORY_CARE_PROVIDER_SITE_OTHER): Payer: Medicare Other | Admitting: Internal Medicine

## 2015-03-08 ENCOUNTER — Encounter: Payer: Self-pay | Admitting: Internal Medicine

## 2015-03-08 VITALS — BP 122/70 | HR 69 | Temp 97.6°F | Resp 16 | Wt 175.0 lb

## 2015-03-08 DIAGNOSIS — I10 Essential (primary) hypertension: Secondary | ICD-10-CM | POA: Diagnosis not present

## 2015-03-08 DIAGNOSIS — E782 Mixed hyperlipidemia: Secondary | ICD-10-CM | POA: Diagnosis not present

## 2015-03-08 DIAGNOSIS — Z86718 Personal history of other venous thrombosis and embolism: Secondary | ICD-10-CM | POA: Diagnosis not present

## 2015-03-08 DIAGNOSIS — K219 Gastro-esophageal reflux disease without esophagitis: Secondary | ICD-10-CM | POA: Diagnosis not present

## 2015-03-08 NOTE — Assessment & Plan Note (Signed)
DVT related to C-section and then years later related to Prempro and prolonged driving Lifelong Coumadin Follows with Jenny Reichmann

## 2015-03-08 NOTE — Progress Notes (Signed)
Subjective:    Patient ID: Bonnie Zhang, female    DOB: 10/30/1940, 74 y.o.   MRN: [id number]  HPI She is here to establish with a new pcp.   Hypertension: She is taking her medication daily. She is compliant with a low sodium diet.  She denies chest pain, palpitations, edema, shortness of breath and regular headaches. She is exercising regularly.    Hyperlipidemia: She is taking her medication daily. She is compliant with a low fat/cholesterol diet. She is exercising regularly. She denies myalgias.   GERD:  She is taking her medication daily as needed only.  She denies any GERD symptoms and feels her GERD is well controlled.   She had a DVT in 1965 when her daughter was born, had a C-section.  She had another DVT - she was taking prempro and was driving longer distances regularly.  She recently saw a hematologist in the past years and was diagnosed with factor V leiden.    Medications and allergies reviewed with patient and updated if appropriate.  Patient Active Problem List   Diagnosis Date Noted  . Vitamin B12 deficiency (non anemic) 01/24/2015  . 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 Personal history of DVT (deep vein thrombosis) 01/23/2015  . Factor V Leiden carrier ([place]) 01/23/2015  . Primary localized osteoarthritis of left knee   . Encounter for therapeutic drug monitoring 06/07/2013  . Glucose intolerance (impaired glucose tolerance) 04/14/2013  . GERD (gastroesophageal reflux disease)   . PVD (peripheral vascular disease) ([place])   . Unspecified adverse effect of unspecified drug, medicinal and biological substance 07/14/2011  . 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 10/09/2010  . Long term current use of anticoagulant 05/01/2010  . Osteoporosis 03/21/2010  . History of colonic polyps 03/20/2008  . Vitamin D deficiency 08/25/2007  . HYPERLIPIDEMIA 03/18/2007  . Dysmetabolic syndrome X [id number]  . Essential hypertension  03/18/2007  . DVT, HX OF 07/23/2006    Current Outpatient Prescriptions on File Prior to Visit  Medication Sig Dispense Refill  . amLODipine (NORVASC) 5 MG tablet Take 1 tablet (5 mg total) by mouth daily. 30 tablet 0  . benazepril (LOTENSIN) 20 MG tablet Take 1 tablet (20 mg total) by mouth daily. 90 tablet 1  . Cholecalciferol 2000 UNITS CAPS Take 2,000 Units by mouth daily.     . folic acid (FOLVITE) 1 MG tablet Take 1 tablet (1 mg total) by mouth daily. 90 tablet 3  . metoprolol tartrate (LOPRESSOR) 25 MG tablet Take one tablet by mouth daily. 90 tablet 1  . pantoprazole (PROTONIX) 40 MG tablet Take 1 tablet (40 mg total) by mouth daily as needed. Acid reflux 30 tablet 0  . simvastatin (ZOCOR) 10 MG tablet Take 1 tablet (10 mg total) by mouth at bedtime. 90 tablet 2  . vitamin B-12 (CYANOCOBALAMIN) 1000 MCG tablet Take 1 tablet (1,000 mcg total) by mouth daily. 90 tablet 3  . warfarin (COUMADIN) 5 MG tablet Take as directed by anticoagulation clinic 90 tablet 1   No current facility-administered medications on file prior to visit.    Past Medical History  Diagnosis Date  . High cholesterol     takes Simvastating daily  . Vitamin D deficiency     takes VIt D daily  . DVT (deep vein thrombosis) in pregnancy      1965 post C section;2001 with prolonged driving while on  HRT  . Osteoporosis   . Hypertension     takes Benazepril,Amlodipine,and Metoprolol  daily  . Hx of migraines     yrs ago   . Bruises easily     pt is on Coumadin;last dose of Coumadin 08/05/11 and then Lovenox started 08/07/11  . Hx of colonic polyps     Dr Sharlett Iles  . PVD (peripheral vascular disease) ([place]) 1965, 2001    abnormal venous doppler findings due to recurrent DVT'S left leg  . Right knee DJD     knees  . Primary localized osteoarthritis of left knee   . Anticoagulated on Coumadin 2001    2 blood clots  . History of colon polyps 2007    Dr Sharlett Iles  . PONV (postoperative nausea and vomiting)   .  Shortness of breath dyspnea   . DVT (deep venous thrombosis) ([place]) 2001    Was on Prempro.    Marland Kitchen GERD (gastroesophageal reflux disease)     Protonix prn  . 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 Vitamin B12 deficiency (non anemic) 01/24/2015    Past Surgical History  Procedure Laterality Date  . Cesarean section  1961/1965/1966    X 3  . Colonoscopy w/ polypectomy  2007    Dr  Sharlett Iles; due? 2012  . Cholecystectomy  1997  . Dilation and curettage of uterus  2012    uterine polyp, Dr Kennon Rounds  . Appendectomy  1965  . Esophagogastroduodenoscopy  1997  . Total knee arthroplasty  08/11/2011    Procedure: TOTAL KNEE ARTHROPLASTY;  Surgeon: Lorn Junes, MD;  Location: [facility];  Service: Orthopedics;  Laterality: Right;  DR [place] THIS CASE  . Wisdom tooth extraction    . Hysteroscopy w/d&c N/A 08/05/2012    Procedure: DILATATION AND CURETTAGE /HYSTEROSCOPY;  Surgeon: Donnamae Jude, MD;  Location: [facility];  Service: Gynecology;  Laterality: N/A;  . Breast lumpectomy  1987  . Robotic assisted total hysterectomy with bilateral salpingo oopherectomy Right 09/14/2012    Procedure: ROBOTIC ASSISTED TOTAL HYSTERECTOMY WITH BILATERAL SALPINGO OOPHORECTOMY ,right pelvic LYMPHNODE dissection;  Surgeon: Imagene Gurney A. Alycia Rossetti, MD;  Location: [facility];  Service: Gynecology;  Laterality: Right;  . Total knee arthroplasty Left 09/11/2014  . Total knee arthroplasty Left 09/11/2014    Procedure: TOTAL KNEE ARTHROPLASTY;  Surgeon: Elsie Saas, MD;  Location: [facility];  Service: Orthopedics;  Laterality: Left;  . Abdominal hysterectomy      Social History   Social History  . Marital Status: Divorced    Spouse Name: N/A  . Number of Children: N/A  . Years of Education: N/A   Social History Main Topics  . Smoking status: Never Smoker   . Smokeless tobacco: Never Used  . Alcohol Use: No  . Drug Use: No  . Sexual Activity: No     Comment: retired. has 2 daughters   Other Topics Concern  .  None   Social History Narrative    Review of Systems  Constitutional: Negative for fever and chills.  Respiratory: Negative for cough, shortness of breath and wheezing.   Cardiovascular: Positive for leg swelling (left leg from DVT). Negative for chest pain and palpitations.  Gastrointestinal: Negative for nausea and abdominal pain.  Neurological: Negative for light-headedness and headaches.       Objective:   Filed Vitals:   03/08/15 0908  BP: 122/70  Pulse: 69  Temp: 97.6 F (36.4 C)  Resp: 16   Filed Weights   03/08/15 0908  Weight: 175 lb (79.379 kg)   Body mass index is 34.18 kg/(m^2).  Physical Exam Constitutional: Appears well-developed and well-nourished. No distress.  Neck: Neck supple. No tracheal deviation present. No thyromegaly present.  No carotid bruit. No cervical adenopathy.   Cardiovascular: Normal rate, regular rhythm and normal heart sounds.   No murmur heard. Pulmonary/Chest: Effort normal and breath sounds normal. No respiratory distress. No wheezes.  Musculoskeletal: left leg mild edema.        Assessment & Plan:   See Problem List.

## 2015-03-08 NOTE — Patient Instructions (Addendum)
It was nice to meet you.  No changes in medications.  Make an appointment for your wellness exam.

## 2015-03-08 NOTE — Assessment & Plan Note (Signed)
Taking simvastatin 10 mg daily Blood work up-to-date-we'll recheck in 6 months Continue regular exercise

## 2015-03-08 NOTE — Assessment & Plan Note (Signed)
BP Readings from Last 3 Encounters:  03/08/15 122/70  01/23/15 154/60  12/28/14 130/64   Blood pressure well-controlled here today Continue current medications at current doses

## 2015-03-08 NOTE — Assessment & Plan Note (Addendum)
Only taking pantoprazole daily as needed Continue same

## 2015-03-08 NOTE — Progress Notes (Signed)
Pre visit review using our clinic review tool, if applicable. No additional management support is needed unless otherwise documented below in the visit note. 

## 2015-03-12 ENCOUNTER — Telehealth: Payer: Self-pay | Admitting: Internal Medicine

## 2015-03-12 MED ORDER — AMLODIPINE BESYLATE 5 MG PO TABS
5.0000 mg | ORAL_TABLET | Freq: Every day | ORAL | Status: DC
Start: 2015-03-12 — End: 2015-09-03

## 2015-03-12 NOTE — Telephone Encounter (Signed)
Patient called stating she was seen last week and we were supposed to send in rx for amlodipine 5mg  90 day supply to [facility] on [address] She states she called her pharmacy and they do not have it.

## 2015-03-28 ENCOUNTER — Ambulatory Visit (INDEPENDENT_AMBULATORY_CARE_PROVIDER_SITE_OTHER): Payer: Medicare Other | Admitting: General Practice

## 2015-03-28 DIAGNOSIS — Z7901 Long term (current) use of anticoagulants: Secondary | ICD-10-CM

## 2015-03-28 DIAGNOSIS — Z86718 Personal history of other venous thrombosis and embolism: Secondary | ICD-10-CM

## 2015-03-28 LAB — POCT INR: INR: 2.7

## 2015-03-28 NOTE — Progress Notes (Signed)
Pre visit review using our clinic review tool, if applicable. No additional management support is needed unless otherwise documented below in the visit note. 

## 2015-03-28 NOTE — Progress Notes (Signed)
I have reviewed and agree with the plan. 

## 2015-03-30 ENCOUNTER — Ambulatory Visit: Payer: Medicare Other | Admitting: Cardiovascular Disease

## 2015-04-20 ENCOUNTER — Encounter: Payer: Self-pay | Admitting: Internal Medicine

## 2015-04-20 ENCOUNTER — Ambulatory Visit (INDEPENDENT_AMBULATORY_CARE_PROVIDER_SITE_OTHER): Payer: Medicare Other | Admitting: Internal Medicine

## 2015-04-20 VITALS — BP 140/72 | HR 74 | Temp 97.6°F | Resp 16 | Wt 176.0 lb

## 2015-04-20 DIAGNOSIS — I10 Essential (primary) hypertension: Secondary | ICD-10-CM | POA: Diagnosis not present

## 2015-04-20 MED ORDER — METOPROLOL SUCCINATE ER 50 MG PO TB24: 50.0000 mg | ORAL_TABLET | Freq: Every day | ORAL | Status: DC

## 2015-04-20 NOTE — Progress Notes (Signed)
Subjective:    Patient ID: Bonnie Zhang, female    DOB: June 17, 1940, 75 y.o.   MRN: [id number]  HPI She is here today because her BP has been high at home.  Her BP on average 152/75.  She has also felt a little lightheaded.   She denies any headaches, chest pain, elevated heart rate or pal and her chronic edema in her legs has been stable. She denies any changes in her medications, diet her sodium intake. She is not exercising much, but that is not new. She had her knee replaced last summer and is limited because of that. She is taking all her medication daily.    Medications and allergies reviewed with patient and updated if appropriate.  Patient Active Problem List   Diagnosis Date Noted  . Vitamin B12 deficiency (non anemic) 01/24/2015  . 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 Primary localized osteoarthritis of left knee   . Encounter for therapeutic drug monitoring 06/07/2013  . Glucose intolerance (impaired glucose tolerance) 04/14/2013  . GERD (gastroesophageal reflux disease)   . PVD (peripheral vascular disease) ([place])   . Unspecified adverse effect of unspecified drug, medicinal and biological substance 07/14/2011  . 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 10/09/2010  . Long term current use of anticoagulant 05/01/2010  . Osteoporosis 03/21/2010  . History of colonic polyps 03/20/2008  . Vitamin D deficiency 08/25/2007  . HYPERLIPIDEMIA 03/18/2007  . Dysmetabolic syndrome X 123XX123  . Essential hypertension 03/18/2007  . DVT, HX OF 07/23/2006    Current Outpatient Prescriptions on File Prior to Visit  Medication Sig Dispense Refill  . amLODipine (NORVASC) 5 MG tablet Take 1 tablet (5 mg total) by mouth daily. 90 tablet 1  . benazepril (LOTENSIN) 20 MG tablet Take 1 tablet (20 mg total) by mouth daily. 90 tablet 1  . Cholecalciferol 2000 UNITS CAPS Take 2,000 Units by mouth daily.     .  folic acid (FOLVITE) 1 MG tablet Take 1 tablet (1 mg total) by mouth daily. 90 tablet 3  . metoprolol tartrate (LOPRESSOR) 25 MG tablet Take one tablet by mouth daily. 90 tablet 1  . pantoprazole (PROTONIX) 40 MG tablet Take 1 tablet (40 mg total) by mouth daily as needed. Acid reflux 30 tablet 0  . simvastatin (ZOCOR) 10 MG tablet Take 1 tablet (10 mg total) by mouth at bedtime. 90 tablet 2  . vitamin B-12 (CYANOCOBALAMIN) 1000 MCG tablet Take 1 tablet (1,000 mcg total) by mouth daily. 90 tablet 3  . warfarin (COUMADIN) 5 MG tablet Take as directed by anticoagulation clinic 90 tablet 1   No current facility-administered medications on file prior to visit.    Past Medical History  Diagnosis Date  . High cholesterol     takes Simvastating daily  . Vitamin D deficiency     takes VIt D daily  . DVT (deep vein thrombosis) in pregnancy      1965 post C section;2001 with prolonged driving while on  HRT  . Osteoporosis   . Hypertension     takes Benazepril,Amlodipine,and Metoprolol daily  . Hx of migraines     yrs ago   . Bruises easily     pt is on Coumadin;last dose of Coumadin 08/05/11 and then Lovenox started 08/07/11  . Hx of colonic polyps     Dr Sharlett Iles  . PVD (peripheral vascular disease) ([place]) 1965, 2001    abnormal venous doppler findings due  to recurrent DVT'S left leg  . Right knee DJD     knees  . Primary localized osteoarthritis of left knee   . Anticoagulated on Coumadin 2001    2 blood clots  . History of colon polyps 2007    Dr Sharlett Iles  . PONV (postoperative nausea and vomiting)   . Shortness of breath dyspnea   . DVT (deep venous thrombosis) ([place]) 2001    Was on Prempro.    Marland Kitchen GERD (gastroesophageal reflux disease)     Protonix prn  . 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 Vitamin B12 deficiency (non anemic) 01/24/2015    Past Surgical History  Procedure Laterality Date  . Cesarean section  1961/1965/1966    X 3  . Colonoscopy w/ polypectomy  2007    Dr   Sharlett Iles; due? 2012  . Cholecystectomy  1997  . Dilation and curettage of uterus  2012    uterine polyp, Dr Kennon Rounds  . Appendectomy  1965  . Esophagogastroduodenoscopy  1997  . Total knee arthroplasty  08/11/2011    Procedure: TOTAL KNEE ARTHROPLASTY;  Surgeon: Lorn Junes, MD;  Location: [facility];  Service: Orthopedics;  Laterality: Right;  DR [place] THIS CASE  . Wisdom tooth extraction    . Hysteroscopy w/d&c N/A 08/05/2012    Procedure: DILATATION AND CURETTAGE /HYSTEROSCOPY;  Surgeon: Donnamae Jude, MD;  Location: [facility];  Service: Gynecology;  Laterality: N/A;  . Breast lumpectomy  1987  . Robotic assisted total hysterectomy with bilateral salpingo oopherectomy Right 09/14/2012    Procedure: ROBOTIC ASSISTED TOTAL HYSTERECTOMY WITH BILATERAL SALPINGO OOPHORECTOMY ,right pelvic LYMPHNODE dissection;  Surgeon: Imagene Gurney A. Alycia Rossetti, MD;  Location: [facility];  Service: Gynecology;  Laterality: Right;  . Total knee arthroplasty Left 09/11/2014  . Total knee arthroplasty Left 09/11/2014    Procedure: TOTAL KNEE ARTHROPLASTY;  Surgeon: Elsie Saas, MD;  Location: [facility];  Service: Orthopedics;  Laterality: Left;  . Abdominal hysterectomy      Social History   Social History  . Marital Status: Divorced    Spouse Name: N/A  . Number of Children: N/A  . Years of Education: N/A   Social History Main Topics  . Smoking status: Never Smoker   . Smokeless tobacco: Never Used  . Alcohol Use: No  . Drug Use: No  . Sexual Activity: No     Comment: retired. has 2 daughters   Other Topics Concern  . None   Social History Narrative    Family History  Problem Relation Age of Onset  . Kidney disease Mother     ? hypertensive  . Hypertension Mother   . Deep vein thrombosis Mother     post ankle fracture  . Leukemia Father   . Colon cancer Maternal Grandmother   . Diabetes Other     cousins  . Anesthesia problems Neg Hx   . Hypotension Neg Hx   . Malignant hyperthermia Neg  Hx   . Pseudochol deficiency Neg Hx   . Stroke Neg Hx   . Heart disease Neg Hx     Review of Systems  Constitutional: Negative for fever.  Respiratory: Negative for shortness of breath.   Cardiovascular: Positive for leg swelling (chronic). Negative for chest pain and palpitations.  Neurological: Positive for light-headedness. Negative for dizziness and headaches.       Objective:   Filed Vitals:   04/20/15 1117  BP: 140/72  Pulse: 74  Temp: 97.6 F (36.4 C)  Resp: 16   Filed Weights   04/20/15 1117  Weight: 176 lb (79.833 kg)   Body mass index is 34.37 kg/(m^2).   Physical Exam Constitutional: Appears well-developed and well-nourished. No distress.  Neck: Neck supple. No tracheal deviation present. No thyromegaly present.  No carotid bruit. No cervical adenopathy.   Cardiovascular: Normal rate, regular rhythm and normal heart sounds.   No murmur heard.  2+ edema L LE, 1+ edema R LE - chronic Pulmonary/Chest: Effort normal and breath sounds normal. No respiratory distress. No wheezes.       Assessment & Plan:   See Problem List for Assessment and Plan of chronic medical problems. She will follow up for a wellness visit in the next couple of months and we can recheck her blood pressure then, but she will return sooner if needed

## 2015-04-20 NOTE — Patient Instructions (Addendum)
We will change the metoprolol to an extended release metoprolol to 50 mg daily.  Continue to monitor your blood pressure regularly at home and call if you have any side effects or if your blood pressure is not controlled.

## 2015-04-20 NOTE — Assessment & Plan Note (Signed)
Blood pressure not controlled-elevated here today and has been on average at home Ideally she should be exercising more, but is limited by her knee replacement and knee pain Low-sodium diet Decreased portions and work on weight loss Continue amlodipine and benazepril at current doses Change metoprolol tartrate 25 mg daily to metoprolol succinate 50 mg daily She will call if she experiences any side effects or if her blood pressure at home is not better controlled

## 2015-04-20 NOTE — Progress Notes (Signed)
Pre visit review using our clinic review tool, if applicable. No additional management support is needed unless otherwise documented below in the visit note. 

## 2015-05-07 ENCOUNTER — Telehealth: Payer: Self-pay | Admitting: Internal Medicine

## 2015-05-07 NOTE — Telephone Encounter (Signed)
Pt states her blood pressure is still a little high and she is coming in to see Jenny Reichmann on Weds and is wondering if she can get her bp checked.  Please advise

## 2015-05-07 NOTE — Telephone Encounter (Signed)
LVM informing pt to call back to schedule a visit on the nurse schedule to have BP checked.

## 2015-05-09 ENCOUNTER — Ambulatory Visit (INDEPENDENT_AMBULATORY_CARE_PROVIDER_SITE_OTHER): Payer: Medicare Other | Admitting: General Practice

## 2015-05-09 VITALS — BP 140/72

## 2015-05-09 DIAGNOSIS — Z86718 Personal history of other venous thrombosis and embolism: Secondary | ICD-10-CM | POA: Diagnosis not present

## 2015-05-09 DIAGNOSIS — Z7901 Long term (current) use of anticoagulants: Secondary | ICD-10-CM | POA: Diagnosis not present

## 2015-05-09 DIAGNOSIS — Z5181 Encounter for therapeutic drug level monitoring: Secondary | ICD-10-CM

## 2015-05-09 LAB — POCT INR: INR: 1.6

## 2015-05-09 NOTE — Progress Notes (Signed)
I have reviewed and agree with the plan. 

## 2015-05-09 NOTE — Progress Notes (Signed)
Pre visit review using our clinic review tool, if applicable. No additional management support is needed unless otherwise documented below in the visit note. INR is low today.  Encouraged patient to purchase and use pill box to make sure dosage is taken.  Educated patient about the risks of a low INR.  Boosted patient 2 days and increased dosage by 10 percent.  Pt verbalized understanding.

## 2015-05-15 ENCOUNTER — Other Ambulatory Visit: Payer: Self-pay | Admitting: Internal Medicine

## 2015-06-06 ENCOUNTER — Ambulatory Visit (INDEPENDENT_AMBULATORY_CARE_PROVIDER_SITE_OTHER): Payer: Medicare Other | Admitting: General Practice

## 2015-06-06 DIAGNOSIS — Z86718 Personal history of other venous thrombosis and embolism: Secondary | ICD-10-CM

## 2015-06-06 DIAGNOSIS — Z7901 Long term (current) use of anticoagulants: Secondary | ICD-10-CM | POA: Diagnosis not present

## 2015-06-06 DIAGNOSIS — Z5181 Encounter for therapeutic drug level monitoring: Secondary | ICD-10-CM | POA: Diagnosis not present

## 2015-06-06 LAB — POCT INR: INR: 2.3

## 2015-06-06 NOTE — Progress Notes (Signed)
Pre visit review using our clinic review tool, if applicable. No additional management support is needed unless otherwise documented below in the visit note. 

## 2015-06-06 NOTE — Progress Notes (Signed)
I have reviewed and agree with the plan. 

## 2015-06-21 ENCOUNTER — Other Ambulatory Visit (INDEPENDENT_AMBULATORY_CARE_PROVIDER_SITE_OTHER): Payer: Medicare Other

## 2015-06-21 ENCOUNTER — Encounter: Payer: Self-pay | Admitting: Internal Medicine

## 2015-06-21 ENCOUNTER — Ambulatory Visit (INDEPENDENT_AMBULATORY_CARE_PROVIDER_SITE_OTHER): Payer: Medicare Other | Admitting: Internal Medicine

## 2015-06-21 VITALS — BP 138/76 | HR 63 | Temp 97.9°F | Resp 18 | Wt 189.0 lb

## 2015-06-21 DIAGNOSIS — R600 Localized edema: Secondary | ICD-10-CM | POA: Diagnosis not present

## 2015-06-21 DIAGNOSIS — R7302 Impaired glucose tolerance (oral): Secondary | ICD-10-CM

## 2015-06-21 DIAGNOSIS — R350 Frequency of micturition: Secondary | ICD-10-CM

## 2015-06-21 DIAGNOSIS — Z0000 Encounter for general adult medical examination without abnormal findings: Secondary | ICD-10-CM

## 2015-06-21 DIAGNOSIS — I10 Essential (primary) hypertension: Secondary | ICD-10-CM | POA: Diagnosis not present

## 2015-06-21 DIAGNOSIS — E538 Deficiency of other specified B group vitamins: Secondary | ICD-10-CM

## 2015-06-21 DIAGNOSIS — K219 Gastro-esophageal reflux disease without esophagitis: Secondary | ICD-10-CM

## 2015-06-21 DIAGNOSIS — E559 Vitamin D deficiency, unspecified: Secondary | ICD-10-CM

## 2015-06-21 LAB — COMPREHENSIVE METABOLIC PANEL
ALT: 11 U/L (ref 0–35)
AST: 16 U/L (ref 0–37)
Albumin: 4.2 g/dL (ref 3.5–5.2)
Alkaline Phosphatase: 89 U/L (ref 39–117)
BUN: 8 mg/dL (ref 6–23)
CO2: 30 mEq/L (ref 19–32)
Calcium: 9.7 mg/dL (ref 8.4–10.5)
Chloride: 106 mEq/L (ref 96–112)
Creatinine, Ser: 0.74 mg/dL (ref 0.40–1.20)
GFR: 81.58 mL/min (ref >60.00)
Glucose, Bld: 101 mg/dL — ABNORMAL HIGH (ref 70–99)
Potassium: 4.4 mEq/L (ref 3.5–5.1)
Sodium: 143 mEq/L (ref 135–145)
Total Bilirubin: 0.6 mg/dL (ref 0.2–1.2)
Total Protein: 7.8 g/dL (ref 6.0–8.3)

## 2015-06-21 LAB — CBC WITH DIFFERENTIAL/PLATELET
Basophils Absolute: 0 10*3/uL (ref 0.0–0.1)
Basophils Relative: 0.4 % (ref 0.0–3.0)
Eosinophils Absolute: 0.1 10*3/uL (ref 0.0–0.7)
Eosinophils Relative: 1.7 % (ref 0.0–5.0)
HCT: 42.8 % (ref 36.0–46.0)
Hemoglobin: 14.5 g/dL (ref 12.0–15.0)
Lymphocytes Relative: 39.3 % (ref 12.0–46.0)
Lymphs Abs: 2.4 10*3/uL (ref 0.7–4.0)
MCHC: 33.8 g/dL (ref 30.0–36.0)
MCV: 86.6 fl (ref 78.0–100.0)
Monocytes Absolute: 0.5 10*3/uL (ref 0.1–1.0)
Monocytes Relative: 7.8 % (ref 3.0–12.0)
Neutro Abs: 3.1 10*3/uL (ref 1.4–7.7)
Neutrophils Relative %: 50.8 % (ref 43.0–77.0)
Platelets: 224 10*3/uL (ref 150.0–400.0)
RBC: 4.95 Mil/uL (ref 3.87–5.11)
RDW: 13.5 % (ref 11.5–15.5)
WBC: 6.1 10*3/uL (ref 4.0–10.5)

## 2015-06-21 LAB — LIPID PANEL
Cholesterol: 182 mg/dL (ref 0–200)
HDL: 73.7 mg/dL (ref >39.00)
LDL Cholesterol: 79 mg/dL (ref 0–99)
NonHDL: 108.15
Total CHOL/HDL Ratio: 2
Triglycerides: 148 mg/dL (ref 0.0–149.0)
VLDL: 29.6 mg/dL (ref 0.0–40.0)

## 2015-06-21 LAB — URINALYSIS, ROUTINE W REFLEX MICROSCOPIC
Bilirubin Urine: NEGATIVE
Ketones, ur: NEGATIVE
Leukocytes, UA: NEGATIVE
Nitrite: NEGATIVE
Specific Gravity, Urine: 1.015 (ref 1.000–1.030)
Total Protein, Urine: NEGATIVE
Urine Glucose: NEGATIVE
Urobilinogen, UA: 0.2 (ref 0.0–1.0)
pH: 7 (ref 5.0–8.0)

## 2015-06-21 LAB — TSH: TSH: 1.92 u[IU]/mL (ref 0.35–4.50)

## 2015-06-21 LAB — HEMOGLOBIN A1C: Hgb A1c MFr Bld: 5.5 % (ref 4.6–6.5)

## 2015-06-21 NOTE — Progress Notes (Addendum)
Subjective:    Patient ID: Bonnie Zhang, female    DOB: [birth year], 75 y.o.   MRN: [id number]  HPI Here for medicare wellness exam/physical.   Her BP average 140-145/70's At home. She is taking her medication regularly. She does watch her sodium intake, increase to much and is not exercising.    I have personally reviewed and have noted 1.The patient's medical and social history 2.Their use of alcohol, tobacco or illicit drugs 3.Their current medications and supplements 4.The patient's functional ability including ADL's, fall risks, home safety risks and                 hearing or visual impairment. 5.Diet and physical activities 6.Evidence for depression or mood disorders    Are there smokers in your home (other than you)? No  Risk Factors Exercise: none, some walking with daily activities Dietary issues discussed: eats too much, should cut back on sweets  Cardiac risk factors: advanced age, hypertension, hyperlipidemia, and obesity (BMI >= 30 kg/m2).  Depression Screen  Have you felt down, depressed or hopeless? No  Have you felt little interest or pleasure in doing things?  No Activities of Daily Living In your present state of health, do you have any difficulty performing the following activities?:  Driving? no Managing money?  no Feeding yourself? no Getting from bed to chair? no Climbing a flight of stairs? no Preparing food and eating?: no Bathing or showering? no Getting dressed: no Getting to/using the toilet? no Moving around from place to place: no In the past year have you fallen or had a near fall?: noHere for medicare wellness.   I have personally reviewed and have noted 1.The patient's medical and social history 2.Their use of alcohol, tobacco or illicit drugs 3.Their current medications and supplements 4.The patient's functional ability including ADL's,  fall risks, home safety risks and                 hearing or visual impairment. 5.Diet and physical activities 6.Evidence for depression or mood disorders 7.Care team reviewed and updated (available in snapshot)   Are there smokers in your home (other than you)? No  Risk Factors Exercise: none Dietary issues discussed:   Cardiac risk factors: advanced age (older than 55 for men, 9 for women), hypertension, hyperlipidemia, and obesity (BMI >= 30 kg/m2).  Depression Screen  Have you felt down, depressed or hopeless? No  Have you felt little interest or pleasure in doing things?  No Activities of Daily Living In your present state of health, do you have any difficulty performing the following activities?:  Driving? Yes Managing money?  Yes Feeding yourself? Yes Getting from bed to chair? Yes Climbing a flight of stairs? Yes Preparing food and eating?: Yes Bathing or showering? Yes Getting dressed: Yes Getting to/using the toilet? Yes Moving around from place to place: Yes In the past year have you fallen or had a near fall?: Yes - may 2016 - slipped on towels   Are you sexually active?  No  Do you have more than one partner?  N/A  Hearing Difficulties: No Do you often ask people to speak up or repeat themselves? No Do you experience ringing or noises in your ears? No Do you have difficulty understanding soft or whispered voices? No Vision:              Any change in vision: no             Up  to date with eye exam: due this year Memory:  Do you feel that you have a problem with memory? No  Do you often misplace items? No  Do you feel safe at home?  Yes  Cognitive Testing  Alert, Orientated? Yes  Normal Appearance? Yes  Recall of three objects?  Yes  Can perform simple calculations? Yes  Displays appropriate judgment? Yes  Can read the correct time from a watch face? Yes   Advanced Directives have been discussed with the patient?  Yes   Are you sexually active?  No  Do you have more than one partner?  N/A  Hearing Difficulties: No Do you often ask people to speak up or repeat themselves? No Do you experience ringing or noises in your ears? No Do you have difficulty understanding soft or whispered voices? No Vision:              Any change in vision:             Up to date with eye exam:  Memory:  Do you feel that you have a problem with memory? No  Do you often misplace items? No  Do you feel safe at home?  Yes  Cognitive Testing  Alert, Orientated? Yes  Normal Appearance? Yes  Recall of three objects?  Yes  Can perform simple calculations? Yes  Displays appropriate judgment? Yes  Can read the correct time from a watch face? Yes   Advanced Directives have been discussed with the patient? Yes  Medications and allergies reviewed with patient and updated if appropriate.  Patient Active Problem List   Diagnosis Date Noted  . Bilateral leg edema 06/21/2015  . Vitamin B12 deficiency (non anemic) 01/24/2015  . 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 Primary localized osteoarthritis of left knee   . Encounter for therapeutic drug monitoring 06/07/2013  . Glucose intolerance (impaired glucose tolerance) 04/14/2013  . GERD (gastroesophageal reflux disease)   . PVD (peripheral vascular disease) ([place])   . 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 10/09/2010  . Long term current use of anticoagulant 05/01/2010  . Osteoporosis 03/21/2010  . History of colonic polyps 03/20/2008  . Vitamin D deficiency 08/25/2007  . HYPERLIPIDEMIA 03/18/2007  . Dysmetabolic syndrome X 123XX123  . Essential hypertension 03/18/2007  . DVT, HX OF 07/23/2006    Current Outpatient Prescriptions on File Prior to Visit  Medication Sig Dispense Refill  . amLODipine (NORVASC) 5 MG tablet Take 1 tablet (5 mg total) by mouth daily. 90 tablet 1  . benazepril  (LOTENSIN) 20 MG tablet TAKE ONE TABLET BY MOUTH ONCE DAILY 90 tablet 3  . Cholecalciferol 2000 UNITS CAPS Take 2,000 Units by mouth daily.     . folic acid (FOLVITE) 1 MG tablet Take 1 tablet (1 mg total) by mouth daily. 90 tablet 3  . furosemide (LASIX) 20 MG tablet Take 10 mg by mouth.    . metoprolol succinate (TOPROL-XL) 50 MG 24 hr tablet Take 1 tablet (50 mg total) by mouth daily. Take with or immediately following a meal. 90 tablet 3  . pantoprazole (PROTONIX) 40 MG tablet Take 1 tablet (40 mg total) by mouth daily as needed. Acid reflux 30 tablet 0  . simvastatin (ZOCOR) 10 MG tablet Take 1 tablet (10 mg total) by mouth at bedtime. 90 tablet 2  . vitamin B-12 (CYANOCOBALAMIN) 1000 MCG tablet Take 1 tablet (1,000 mcg total) by mouth daily. 90 tablet 3  .  warfarin (COUMADIN) 5 MG tablet Take as directed by anticoagulation clinic 90 tablet 1   No current facility-administered medications on file prior to visit.    Past Medical History  Diagnosis Date  . High cholesterol     takes Simvastating daily  . Vitamin D deficiency     takes VIt D daily  . DVT (deep vein thrombosis) in pregnancy      1965 post C section;2001 with prolonged driving while on  HRT  . Osteoporosis   . Hypertension     takes Benazepril,Amlodipine,and Metoprolol daily  . Hx of migraines     yrs ago   . Bruises easily     pt is on Coumadin;last dose of Coumadin 08/05/11 and then Lovenox started 08/07/11  . Hx of colonic polyps     Dr Sharlett Iles  . PVD (peripheral vascular disease) ([place]) 1965, 2001    abnormal venous doppler findings due to recurrent DVT'S left leg  . Right knee DJD     knees  . Primary localized osteoarthritis of left knee   . Anticoagulated on Coumadin 2001    2 blood clots  . History of colon polyps 2007    Dr Sharlett Iles  . PONV (postoperative nausea and vomiting)   . Shortness of breath dyspnea   . DVT (deep venous thrombosis) ([place]) 2001    Was on Prempro.    Marland Kitchen GERD (gastroesophageal  reflux disease)     Protonix prn  . 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 Vitamin B12 deficiency (non anemic) 01/24/2015    Past Surgical History  Procedure Laterality Date  . Cesarean section  1961/1965/1966    X 3  . Colonoscopy w/ polypectomy  2007    Dr  Sharlett Iles; due? 2012  . Cholecystectomy  1997  . Dilation and curettage of uterus  2012    uterine polyp, Dr Kennon Rounds  . Appendectomy  1965  . Esophagogastroduodenoscopy  1997  . Total knee arthroplasty  08/11/2011    Procedure: TOTAL KNEE ARTHROPLASTY;  Surgeon: Lorn Junes, MD;  Location: [facility];  Service: Orthopedics;  Laterality: Right;  DR [place] THIS CASE  . Wisdom tooth extraction    . Hysteroscopy w/d&c N/A 08/05/2012    Procedure: DILATATION AND CURETTAGE /HYSTEROSCOPY;  Surgeon: Donnamae Jude, MD;  Location: [facility];  Service: Gynecology;  Laterality: N/A;  . Breast lumpectomy  1987  . Robotic assisted total hysterectomy with bilateral salpingo oopherectomy Right 09/14/2012    Procedure: ROBOTIC ASSISTED TOTAL HYSTERECTOMY WITH BILATERAL SALPINGO OOPHORECTOMY ,right pelvic LYMPHNODE dissection;  Surgeon: Imagene Gurney A. Alycia Rossetti, MD;  Location: [facility];  Service: Gynecology;  Laterality: Right;  . Total knee arthroplasty Left 09/11/2014  . Total knee arthroplasty Left 09/11/2014    Procedure: TOTAL KNEE ARTHROPLASTY;  Surgeon: Elsie Saas, MD;  Location: [facility];  Service: Orthopedics;  Laterality: Left;  . Abdominal hysterectomy      Social History   Social History  . Marital Status: Divorced    Spouse Name: N/A  . Number of Children: N/A  . Years of Education: N/A   Social History Main Topics  . Smoking status: Never Smoker   . Smokeless tobacco: Never Used  . Alcohol Use: No  . Drug Use: No  . Sexual Activity: No     Comment: retired. has 2 daughters   Other Topics Concern  . None   Social History Narrative   No regular exercise    Family History  Problem Relation  Age of Onset  . Kidney  disease Mother     ? hypertensive  . Hypertension Mother   . Deep vein thrombosis Mother     post ankle fracture  . Leukemia Father   . Colon cancer Maternal Grandmother   . Diabetes Other     cousins  . Anesthesia problems Neg Hx   . Hypotension Neg Hx   . Malignant hyperthermia Neg Hx   . Pseudochol deficiency Neg Hx   . Stroke Neg Hx   . Heart disease Neg Hx     Review of Systems  Constitutional: Negative for fever, chills, appetite change, fatigue and unexpected weight change.  HENT: Negative for hearing loss and tinnitus.   Eyes: Negative for visual disturbance.  Respiratory: Negative for cough, shortness of breath and wheezing.   Cardiovascular: Positive for leg swelling (legs - controlled). Negative for chest pain and palpitations.  Gastrointestinal: Negative for nausea, abdominal pain, diarrhea, constipation and blood in stool.       Sometimes gerd - takes medication as needed - every few weeks  Endocrine: Negative for polydipsia and polyuria.  Genitourinary: Positive for frequency. Negative for dysuria and hematuria.  Musculoskeletal: Positive for arthralgias (b/l knees). Negative for myalgias and back pain.  Skin: Negative for color change and rash.  Neurological: Positive for light-headedness and headaches (occasional). Negative for dizziness, weakness and numbness.  Psychiatric/Behavioral: Negative for dysphoric mood. The patient is not nervous/anxious.        Objective:   Filed Vitals:   06/21/15 0951  BP: 138/76  Pulse: 63  Temp: 97.9 F (36.6 C)  Resp: 18   Filed Weights   06/21/15 0951  Weight: 189 lb (85.73 kg)   Body mass index is 36.91 kg/(m^2).   Physical Exam Constitutional: She appears well-developed and well-nourished. No distress.  HENT:  Head: Normocephalic and atraumatic.  Right Ear: External ear normal. Normal ear canal and TM Left Ear: External ear normal.  Normal ear canal and TM Mouth/Throat: Oropharynx is clear and moist.  Normal  bilateral ear canals and tympanic membranes  Eyes: Conjunctivae and EOM are normal.  Neck: Neck supple. No tracheal deviation present. No thyromegaly present.  No carotid bruit  Cardiovascular: Normal rate, regular rhythm and normal heart sounds.   No murmur heard.  No edema. Pulmonary/Chest: Effort normal and breath sounds normal. No respiratory distress. She has no wheezes. She has no rales.  Breast: Bilateral breast exam normal-no skin changes, lumps, nipple discharge or axillary lymphadenopathy bilaterally Abdominal: Soft. She exhibits no distension. There is no tenderness.  Lymphadenopathy: She has no cervical adenopathy.  Skin: Skin is warm and dry. She is not diaphoretic.  Psychiatric: She has a normal mood and affect. Her behavior is normal.       Assessment & Plan:   Wellness Exam Immunizations  Up to date or deferred Colonoscopy  Up to date  Mammogram   Up to date  dexa  Up to date  Gyn -- does not follow  S/p hysterectomy Eye exam -  Will shcedule Hearing loss - none Memory concerns/difficulties  - no concerns Independent of ADLs - completely independent   Physical exam: Screening blood workHas been ordered Immunizations-up to date Colonoscopy  Up to date  Mammogram   Up to date  Gyn - s/p complete hysterectomy Dexa    Up to date  Eye exams - due this year - will schedule EKG  Last done 11/2014, no need to repeat today Exercise-Currently not exercising. Stressed  the importance of regular exercise for multiple reasons WeightPatient obese, discussed the importance of weight loss. Increase exercise and decreased portions Skin -No concerns Substance abuse-no evidence of substance abuse   See Problem List for Assessment and Plan of chronic medical problems.  Follow-up in 6 months

## 2015-06-21 NOTE — Assessment & Plan Note (Addendum)
Taking lasix 10 mg every other day Edema fairly controlled

## 2015-06-21 NOTE — Assessment & Plan Note (Signed)
GERD controlled Continue pantoprazole only as needed

## 2015-06-21 NOTE — Assessment & Plan Note (Signed)
Blood pressure reasonably controlled-anticoagulated with her blood pressure slightly lower, but I do not think it is worth increasing her medication because of the risk of side effects. She will try working on losing weight and increasing her exercise Continue current medications Check CMP

## 2015-06-21 NOTE — Assessment & Plan Note (Signed)
Check A1c. 

## 2015-06-21 NOTE — Assessment & Plan Note (Signed)
?  

## 2015-06-21 NOTE — Assessment & Plan Note (Signed)
Taking vitamin D daily Recheck level

## 2015-06-21 NOTE — Patient Instructions (Addendum)
  Bonnie Zhang , Thank you for taking time to come for your Medicare Wellness Visit. I appreciate your ongoing commitment to your health goals. Please review the following plan we discussed and let me know if I can assist you in the future.   These are the goals we discussed: Goals    None      This is a list of the screening recommended for you and due dates:  Health Maintenance  Topic Date Due  . Flu Shot  06/29/2015*  . Shingles Vaccine  03/31/2016*  . Pneumonia vaccines (1 of 2 - PCV13) 03/31/2016*  . Mammogram  08/10/2016  . Colon Cancer Screening  04/20/2017  . Tetanus Vaccine  03/21/2020  . DEXA scan (bone density measurement)  Completed  *Topic was postponed. The date shown is not the original due date.    Test(s) ordered today. Your results will be released to [place] (or called to you) after review, usually within 72hours after test completion. If any changes need to be made, you will be notified at that same time.  All other Health Maintenance issues reviewed.   All recommended immunizations and age-appropriate screenings are up-to-date or discussed.  No immunizations administered today.   Medications reviewed and updated.  No changes recommended at this time.  Please followup in 6 months

## 2015-06-22 LAB — VITAMIN B12: Vitamin B-12: 1338 pg/mL — ABNORMAL HIGH (ref 211–911)

## 2015-06-23 LAB — URINE CULTURE
Colony Count: NO GROWTH
Organism ID, Bacteria: NO GROWTH

## 2015-06-24 LAB — VITAMIN D 1,25 DIHYDROXY
Vitamin D 1, 25 (OH)2 Total: 72 pg/mL (ref 18–72)
Vitamin D2 1, 25 (OH)2: <8 pg/mL
Vitamin D3 1, 25 (OH)2: 72 pg/mL

## 2015-06-28 ENCOUNTER — Encounter: Payer: Self-pay | Admitting: Emergency Medicine

## 2015-07-03 ENCOUNTER — Other Ambulatory Visit: Payer: Self-pay | Admitting: Internal Medicine

## 2015-07-04 ENCOUNTER — Ambulatory Visit (INDEPENDENT_AMBULATORY_CARE_PROVIDER_SITE_OTHER): Payer: Medicare Other | Admitting: General Practice

## 2015-07-04 DIAGNOSIS — Z5181 Encounter for therapeutic drug level monitoring: Secondary | ICD-10-CM

## 2015-07-04 DIAGNOSIS — Z86718 Personal history of other venous thrombosis and embolism: Secondary | ICD-10-CM | POA: Diagnosis not present

## 2015-07-04 DIAGNOSIS — Z7901 Long term (current) use of anticoagulants: Secondary | ICD-10-CM | POA: Diagnosis not present

## 2015-07-04 LAB — POCT INR: INR: 2.1

## 2015-07-04 NOTE — Progress Notes (Signed)
Pre visit review using our clinic review tool, if applicable. No additional management support is needed unless otherwise documented below in the visit note. 

## 2015-07-04 NOTE — Progress Notes (Signed)
I have reviewed and agree with the plan. 

## 2015-08-01 ENCOUNTER — Ambulatory Visit (INDEPENDENT_AMBULATORY_CARE_PROVIDER_SITE_OTHER): Payer: Medicare Other | Admitting: General Practice

## 2015-08-01 DIAGNOSIS — Z7901 Long term (current) use of anticoagulants: Secondary | ICD-10-CM

## 2015-08-01 DIAGNOSIS — Z86718 Personal history of other venous thrombosis and embolism: Secondary | ICD-10-CM | POA: Diagnosis not present

## 2015-08-01 DIAGNOSIS — Z5181 Encounter for therapeutic drug level monitoring: Secondary | ICD-10-CM

## 2015-08-01 LAB — POCT INR: INR: 2.6

## 2015-08-01 NOTE — Progress Notes (Signed)
Pre visit review using our clinic review tool, if applicable. No additional management support is needed unless otherwise documented below in the visit note. 

## 2015-08-01 NOTE — Progress Notes (Signed)
I have reviewed and agree with the plan. 

## 2015-08-07 ENCOUNTER — Ambulatory Visit: Payer: Medicare Other | Admitting: Internal Medicine

## 2015-09-03 ENCOUNTER — Ambulatory Visit (INDEPENDENT_AMBULATORY_CARE_PROVIDER_SITE_OTHER): Payer: Medicare Other | Admitting: Internal Medicine

## 2015-09-03 ENCOUNTER — Encounter: Payer: Self-pay | Admitting: Internal Medicine

## 2015-09-03 VITALS — BP 138/78 | HR 72 | Temp 97.8°F | Resp 16 | Wt 187.0 lb

## 2015-09-03 DIAGNOSIS — I10 Essential (primary) hypertension: Secondary | ICD-10-CM

## 2015-09-03 DIAGNOSIS — R600 Localized edema: Secondary | ICD-10-CM | POA: Diagnosis not present

## 2015-09-03 MED ORDER — FUROSEMIDE 20 MG PO TABS: 20.0000 mg | ORAL_TABLET | Freq: Every day | ORAL | Status: DC

## 2015-09-03 NOTE — Assessment & Plan Note (Signed)
Amlodipine may be contributing - will d/c Restart lasix, but at 20 mg daily cmp next week - check renal function , K May need to adjust BP meds if BP is not well controlled at home

## 2015-09-03 NOTE — Progress Notes (Signed)
Pre visit review using our clinic review tool, if applicable. No additional management support is needed unless otherwise documented below in the visit note. 

## 2015-09-03 NOTE — Progress Notes (Signed)
Subjective:    Patient ID: Bonnie Zhang, female    DOB: [birth year], 75 y.o.   MRN: [id number]  HPI She is here for follow up.  Leg swelling:  Her left leg is worse than the right.  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  She has daily swelling, worse at the end of the day.  She was taking lasix 10 mg daily  But it was not helping and she stopped it a couple of weeks ago - there has been on change in the swelling since topping the Lasix.  The swelling is worse when she is up more.  She is compliant with a low sodiumd diet.  .  She has noticed lumps in her legs a couple of months ago.  She has very mild erythema at the end of the day.   She denies fevers.  Medications and allergies reviewed with patient and updated if appropriate.  Patient Active Problem List   Diagnosis Date Noted  . Bilateral leg edema 06/21/2015  . Vitamin B12 deficiency (non anemic) 01/24/2015  . 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 Primary localized osteoarthritis of left knee   . Encounter for therapeutic drug monitoring 06/07/2013  . Glucose intolerance (impaired glucose tolerance) 04/14/2013  . GERD (gastroesophageal reflux disease)   . PVD (peripheral vascular disease) ([place])   . 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 10/09/2010  . Long term current use of anticoagulant 05/01/2010  . Osteoporosis 03/21/2010  . History of colonic polyps 03/20/2008  . Vitamin D deficiency 08/25/2007  . HYPERLIPIDEMIA 03/18/2007  . Dysmetabolic syndrome X 123XX123  . Essential hypertension 03/18/2007  . DVT, HX OF 07/23/2006    Current Outpatient Prescriptions on File Prior to Visit  Medication Sig Dispense Refill  . amLODipine (NORVASC) 5 MG tablet Take 1 tablet (5 mg total) by mouth daily. 90 tablet 1  . benazepril (LOTENSIN) 20 MG tablet TAKE ONE TABLET BY MOUTH ONCE DAILY 90 tablet 3  . Cholecalciferol 2000 UNITS CAPS  Take 2,000 Units by mouth daily.     . folic acid (FOLVITE) 1 MG tablet Take 1 tablet (1 mg total) by mouth daily. 90 tablet 3  . metoprolol succinate (TOPROL-XL) 50 MG 24 hr tablet Take 1 tablet (50 mg total) by mouth daily. Take with or immediately following a meal. 90 tablet 3  . pantoprazole (PROTONIX) 40 MG tablet Take 1 tablet (40 mg total) by mouth daily as needed. Acid reflux 30 tablet 0  . simvastatin (ZOCOR) 10 MG tablet TAKE ONE TABLET BY MOUTH AT BEDTIME 90 tablet 2  . vitamin B-12 (CYANOCOBALAMIN) 1000 MCG tablet Take 1 tablet (1,000 mcg total) by mouth daily. 90 tablet 3  . warfarin (COUMADIN) 5 MG tablet Take as directed by anticoagulation clinic 90 tablet 1  . furosemide (LASIX) 20 MG tablet Take 10 mg by mouth. Reported on 09/03/2015     No current facility-administered medications on file prior to visit.    Past Medical History  Diagnosis Date  . High cholesterol     takes Simvastating daily  . Vitamin D deficiency     takes VIt D daily  . DVT (deep vein thrombosis) in pregnancy      1965 post C section;2001 with prolonged driving while on  HRT  . Osteoporosis   . Hypertension     takes Benazepril,Amlodipine,and Metoprolol daily  . Hx of migraines  yrs ago   . Bruises easily     pt is on Coumadin;last dose of Coumadin 08/05/11 and then Lovenox started 08/07/11  . Hx of colonic polyps     Dr Sharlett Iles  . PVD (peripheral vascular disease) ([place]) 1965, 2001    abnormal venous doppler findings due to recurrent DVT'S left leg  . Right knee DJD     knees  . Primary localized osteoarthritis of left knee   . Anticoagulated on Coumadin 2001    2 blood clots  . History of colon polyps 2007    Dr Sharlett Iles  . PONV (postoperative nausea and vomiting)   . Shortness of breath dyspnea   . DVT (deep venous thrombosis) ([place]) 2001    Was on Prempro.    Marland Kitchen GERD (gastroesophageal reflux disease)     Protonix prn  . 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 Vitamin B12 deficiency (non  anemic) 01/24/2015    Past Surgical History  Procedure Laterality Date  . Cesarean section  1961/1965/1966    X 3  . Colonoscopy w/ polypectomy  2007    Dr  Sharlett Iles; due? 2012  . Cholecystectomy  1997  . Dilation and curettage of uterus  2012    uterine polyp, Dr Kennon Rounds  . Appendectomy  1965  . Esophagogastroduodenoscopy  1997  . Total knee arthroplasty  08/11/2011    Procedure: TOTAL KNEE ARTHROPLASTY;  Surgeon: Lorn Junes, MD;  Location: [facility];  Service: Orthopedics;  Laterality: Right;  DR [place] THIS CASE  . Wisdom tooth extraction    . Hysteroscopy w/d&c N/A 08/05/2012    Procedure: DILATATION AND CURETTAGE /HYSTEROSCOPY;  Surgeon: Donnamae Jude, MD;  Location: [facility];  Service: Gynecology;  Laterality: N/A;  . Breast lumpectomy  1987  . Robotic assisted total hysterectomy with bilateral salpingo oopherectomy Right 09/14/2012    Procedure: ROBOTIC ASSISTED TOTAL HYSTERECTOMY WITH BILATERAL SALPINGO OOPHORECTOMY ,right pelvic LYMPHNODE dissection;  Surgeon: Imagene Gurney A. Alycia Rossetti, MD;  Location: [facility];  Service: Gynecology;  Laterality: Right;  . Total knee arthroplasty Left 09/11/2014  . Total knee arthroplasty Left 09/11/2014    Procedure: TOTAL KNEE ARTHROPLASTY;  Surgeon: Elsie Saas, MD;  Location: [facility];  Service: Orthopedics;  Laterality: Left;  . Abdominal hysterectomy      Social History   Social History  . Marital Status: Divorced    Spouse Name: N/A  . Number of Children: N/A  . Years of Education: N/A   Social History Main Topics  . Smoking status: Never Smoker   . Smokeless tobacco: Never Used  . Alcohol Use: No  . Drug Use: No  . Sexual Activity: No     Comment: retired. has 2 daughters   Other Topics Concern  . Not on file   Social History Narrative   No regular exercise    Family History  Problem Relation Age of Onset  . Kidney disease Mother     ? hypertensive  . Hypertension Mother   . Deep vein thrombosis Mother      post ankle fracture  . Leukemia Father   . Colon cancer Maternal Grandmother   . Diabetes Other     cousins  . Anesthesia problems Neg Hx   . Hypotension Neg Hx   . Malignant hyperthermia Neg Hx   . Pseudochol deficiency Neg Hx   . Stroke Neg Hx   . Heart disease Neg Hx     Review of Systems  Constitutional: Negative for  fever.  Respiratory: Negative for shortness of breath.   Cardiovascular: Positive for leg swelling. Negative for chest pain and palpitations.  Skin: Positive for color change (Mild erythema at the end of the day only).  Neurological: Negative for light-headedness and headaches.       Objective:   Filed Vitals:   09/03/15 1603  BP: 138/78  Pulse: 72  Temp: 97.8 F (36.6 C)  Resp: 16   Filed Weights   09/03/15 1603  Weight: 187 lb (84.823 kg)   Body mass index is 36.52 kg/(m^2).   Physical Exam Constitutional: Appears well-developed and well-nourished. No distress.  Neck: Neck supple. No tracheal deviation present. No thyromegaly present.  No carotid bruit. No cervical adenopathy.   Cardiovascular: Normal rate, regular rhythm and normal heart sounds.   No murmur heard.  2+pitting edema b/l LE -  Lumps she is feeling in her legs is related to the swelling, Pulmonary/Chest: Effort normal and breath sounds normal. No respiratory distress. No wheezes.  Skin: No erythema, no ulcers or skin breakdown     Assessment & Plan:   See Problem List for Assessment and Plan of chronic medical problems.

## 2015-09-03 NOTE — Assessment & Plan Note (Signed)
Blood pressure is well-controlled We will be adjusting her medications  - she will monitor her BP at home. She knows we may need to further adjust her medication if her bp is not controlled Discontinue amlodipine since this may be contributing to her edema Continue benazepril and metoprolol at current doses

## 2015-09-03 NOTE — Patient Instructions (Signed)
Have blood done next week to check your kidney function, potassium - you do not need to fast.    Start lasix (water pills) daily at 20 mg in the morning.  Stop the amlodipine 5 mg daily.  Monitor your blood pressure at home.  If your blood pressure is frequently more than 150/90 call so we can adjust your medication.

## 2015-09-12 ENCOUNTER — Other Ambulatory Visit (INDEPENDENT_AMBULATORY_CARE_PROVIDER_SITE_OTHER): Payer: Medicare Other

## 2015-09-12 ENCOUNTER — Ambulatory Visit (INDEPENDENT_AMBULATORY_CARE_PROVIDER_SITE_OTHER): Payer: Medicare Other | Admitting: General Practice

## 2015-09-12 DIAGNOSIS — Z7901 Long term (current) use of anticoagulants: Secondary | ICD-10-CM

## 2015-09-12 DIAGNOSIS — Z86718 Personal history of other venous thrombosis and embolism: Secondary | ICD-10-CM | POA: Diagnosis not present

## 2015-09-12 DIAGNOSIS — I10 Essential (primary) hypertension: Secondary | ICD-10-CM

## 2015-09-12 DIAGNOSIS — Z5181 Encounter for therapeutic drug level monitoring: Secondary | ICD-10-CM | POA: Diagnosis not present

## 2015-09-12 DIAGNOSIS — R600 Localized edema: Secondary | ICD-10-CM | POA: Diagnosis not present

## 2015-09-12 LAB — COMPREHENSIVE METABOLIC PANEL
ALT: 10 U/L (ref 0–35)
AST: 15 U/L (ref 0–37)
Albumin: 4.2 g/dL (ref 3.5–5.2)
Alkaline Phosphatase: 81 U/L (ref 39–117)
BUN: 9 mg/dL (ref 6–23)
CO2: 32 mEq/L (ref 19–32)
Calcium: 9.6 mg/dL (ref 8.4–10.5)
Chloride: 103 mEq/L (ref 96–112)
Creatinine, Ser: 0.76 mg/dL (ref 0.40–1.20)
GFR: 79.06 mL/min (ref >60.00)
Glucose, Bld: 106 mg/dL — ABNORMAL HIGH (ref 70–99)
Potassium: 4.3 mEq/L (ref 3.5–5.1)
Sodium: 141 mEq/L (ref 135–145)
Total Bilirubin: 0.6 mg/dL (ref 0.2–1.2)
Total Protein: 7.5 g/dL (ref 6.0–8.3)

## 2015-09-12 LAB — POCT INR: INR: 2.1

## 2015-09-12 NOTE — Progress Notes (Signed)
Pre visit review using our clinic review tool, if applicable. No additional management support is needed unless otherwise documented below in the visit note. 

## 2015-09-12 NOTE — Progress Notes (Signed)
I have reviewed and agree with the plan. 

## 2015-09-25 DIAGNOSIS — M170 Bilateral primary osteoarthritis of knee: Secondary | ICD-10-CM | POA: Diagnosis not present

## 2015-09-25 DIAGNOSIS — S93602A Unspecified sprain of left foot, initial encounter: Secondary | ICD-10-CM | POA: Diagnosis not present

## 2015-10-10 ENCOUNTER — Other Ambulatory Visit: Payer: Self-pay | Admitting: General Practice

## 2015-10-10 MED ORDER — WARFARIN SODIUM 5 MG PO TABS: ORAL_TABLET | ORAL | Status: DC

## 2015-10-24 ENCOUNTER — Ambulatory Visit (INDEPENDENT_AMBULATORY_CARE_PROVIDER_SITE_OTHER): Payer: Medicare Other | Admitting: General Practice

## 2015-10-24 DIAGNOSIS — Z7901 Long term (current) use of anticoagulants: Secondary | ICD-10-CM

## 2015-10-24 DIAGNOSIS — Z5181 Encounter for therapeutic drug level monitoring: Secondary | ICD-10-CM | POA: Diagnosis not present

## 2015-10-24 DIAGNOSIS — Z86718 Personal history of other venous thrombosis and embolism: Secondary | ICD-10-CM

## 2015-10-24 LAB — POCT INR: INR: 3.7

## 2015-10-24 NOTE — Progress Notes (Signed)
I have reviewed and agree with the plan. 

## 2015-11-21 ENCOUNTER — Ambulatory Visit (INDEPENDENT_AMBULATORY_CARE_PROVIDER_SITE_OTHER): Payer: Medicare Other | Admitting: General Practice

## 2015-11-21 DIAGNOSIS — Z5181 Encounter for therapeutic drug level monitoring: Secondary | ICD-10-CM

## 2015-11-21 DIAGNOSIS — Z86718 Personal history of other venous thrombosis and embolism: Secondary | ICD-10-CM

## 2015-11-21 DIAGNOSIS — Z7901 Long term (current) use of anticoagulants: Secondary | ICD-10-CM | POA: Diagnosis not present

## 2015-11-21 LAB — POCT INR: INR: 2.5

## 2015-11-22 NOTE — Progress Notes (Signed)
I have reviewed and agree with the plan.     [name], MD

## 2015-12-13 ENCOUNTER — Other Ambulatory Visit: Payer: Self-pay | Admitting: Internal Medicine

## 2015-12-14 ENCOUNTER — Other Ambulatory Visit: Payer: Self-pay | Admitting: Internal Medicine

## 2015-12-15 ENCOUNTER — Other Ambulatory Visit: Payer: Self-pay | Admitting: Internal Medicine

## 2015-12-17 ENCOUNTER — Other Ambulatory Visit: Payer: Self-pay | Admitting: Emergency Medicine

## 2015-12-17 MED ORDER — PANTOPRAZOLE SODIUM 40 MG PO TBEC: 40.0000 mg | DELAYED_RELEASE_TABLET | Freq: Every day | ORAL | 5 refills | Status: DC | PRN

## 2015-12-19 ENCOUNTER — Ambulatory Visit (INDEPENDENT_AMBULATORY_CARE_PROVIDER_SITE_OTHER): Payer: Medicare Other | Admitting: General Practice

## 2015-12-19 DIAGNOSIS — Z5181 Encounter for therapeutic drug level monitoring: Secondary | ICD-10-CM | POA: Diagnosis not present

## 2015-12-19 DIAGNOSIS — Z86718 Personal history of other venous thrombosis and embolism: Secondary | ICD-10-CM

## 2015-12-19 DIAGNOSIS — Z7901 Long term (current) use of anticoagulants: Secondary | ICD-10-CM | POA: Diagnosis not present

## 2015-12-19 LAB — POCT INR: INR: 3.2

## 2015-12-19 NOTE — Progress Notes (Signed)
I have reviewed and agree with the plan. 

## 2015-12-23 NOTE — Progress Notes (Signed)
Subjective:    Patient ID: Bonnie Zhang, female    DOB: 1941-02-16, 75 y.o.   MRN: [id number]  HPI The patient is here for follow up.  Hypertension: She is taking her medication daily. She is compliant with a low sodium diet.  She denies chest pain, palpitations, shortness of breath and regular headaches. She is walking some for exercise.  She does monitor her blood pressure at home and it has been controlled.    Leg edema:  Her amlodipine was discontinued 08/2015.  Lasix was started 20 mg daily (prior she was taken 10 mg).  Her edema is better, but is still there. She does not add salt to her food.   GERD:  She is taking her medication daily as needed - may need to take it about once a month.  She denies any GERD symptoms and feels her GERD is well controlled.   Hyperlipidemia: She is taking her medication daily. She is compliant with a low fat/cholesterol diet. She is exercising some - walking. She denies myalgias.   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    She intermittently can hear her heart beat in her left ear.  It is beating normally, but she can hear it in the hear.  She denies changes in her hearing.     Medications and allergies reviewed with patient and updated if appropriate.  Patient Active Problem List   Diagnosis Date Noted  . Bilateral leg edema 06/21/2015  . Vitamin B12 deficiency (non anemic) 01/24/2015  . 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 Primary localized osteoarthritis of left knee   . Encounter for therapeutic drug monitoring 06/07/2013  . Glucose intolerance (impaired glucose tolerance) 04/14/2013  . GERD (gastroesophageal reflux disease)   . PVD (peripheral vascular disease) ([place])   . 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 10/09/2010  . Long term current use of anticoagulant 05/01/2010  . Osteoporosis 03/21/2010  . History of colonic polyps 03/20/2008  .  Vitamin D deficiency 08/25/2007  . HYPERLIPIDEMIA 03/18/2007  . Dysmetabolic syndrome X 123XX123  . Essential hypertension 03/18/2007  . DVT, HX OF 07/23/2006    Current Outpatient Prescriptions on File Prior to Visit  Medication Sig Dispense Refill  . benazepril (LOTENSIN) 20 MG tablet TAKE ONE TABLET BY MOUTH ONCE DAILY 90 tablet 3  . Cholecalciferol 2000 UNITS CAPS Take 2,000 Units by mouth daily.     . folic acid (FOLVITE) 1 MG tablet Take 1 tablet (1 mg total) by mouth daily. 90 tablet 3  . furosemide (LASIX) 20 MG tablet Take 1 tablet (20 mg total) by mouth daily. Reported on 09/03/2015 30 tablet 8  . metoprolol succinate (TOPROL-XL) 50 MG 24 hr tablet Take 1 tablet (50 mg total) by mouth daily. Take with or immediately following a meal. 90 tablet 3  . pantoprazole (PROTONIX) 40 MG tablet Take 1 tablet (40 mg total) by mouth daily as needed. Acid reflux 30 tablet 5  . simvastatin (ZOCOR) 10 MG tablet TAKE ONE TABLET BY MOUTH AT BEDTIME 90 tablet 2  . vitamin B-12 (CYANOCOBALAMIN) 1000 MCG tablet Take 1 tablet (1,000 mcg total) by mouth daily. 90 tablet 3  . warfarin (COUMADIN) 5 MG tablet Take as directed by anticoagulation clinic 90 tablet 1   No current facility-administered medications on file prior to visit.     Past Medical History:  Diagnosis Date  . Anticoagulated on Coumadin 2001  2 blood clots  . Bruises easily    pt is on Coumadin;last dose of Coumadin 08/05/11 and then Lovenox started 08/07/11  . DVT (deep vein thrombosis) in pregnancy     1965 post C section;2001 with prolonged driving while on  HRT  . DVT (deep venous thrombosis) ([place]) 2001   Was on Prempro.    Marland Kitchen GERD (gastroesophageal reflux disease)    Protonix prn  . High cholesterol    takes Simvastating daily  . History of colon polyps 2007   Dr Sharlett Iles  . 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 Hx of colonic polyps    Dr Sharlett Iles  . Hx of migraines    yrs ago   . Hypertension    takes  Benazepril,Amlodipine,and Metoprolol daily  . Osteoporosis   . PONV (postoperative nausea and vomiting)   . Primary localized osteoarthritis of left knee   . PVD (peripheral vascular disease) ([place]) 1965, 2001   abnormal venous doppler findings due to recurrent DVT'S left leg  . Right knee DJD    knees  . Shortness of breath dyspnea   . Vitamin B12 deficiency (non anemic) 01/24/2015  . Vitamin D deficiency    takes VIt D daily    Past Surgical History:  Procedure Laterality Date  . ABDOMINAL HYSTERECTOMY    . APPENDECTOMY  1965  . BREAST LUMPECTOMY  1987  . CESAREAN SECTION  1961/1965/1966   X 3  . CHOLECYSTECTOMY  1997  . COLONOSCOPY W/ POLYPECTOMY  2007   Dr  Sharlett Iles; due? 2012  . DILATION AND CURETTAGE OF UTERUS  2012   uterine polyp, Dr Kennon Rounds  . ESOPHAGOGASTRODUODENOSCOPY  1997  . HYSTEROSCOPY W/D&C N/A 08/05/2012   Procedure: DILATATION AND CURETTAGE /HYSTEROSCOPY;  Surgeon: Donnamae Jude, MD;  Location: [facility];  Service: Gynecology;  Laterality: N/A;  . ROBOTIC ASSISTED TOTAL HYSTERECTOMY WITH BILATERAL SALPINGO OOPHERECTOMY Right 09/14/2012   Procedure: ROBOTIC ASSISTED TOTAL HYSTERECTOMY WITH BILATERAL SALPINGO OOPHORECTOMY ,right pelvic LYMPHNODE dissection;  Surgeon: Imagene Gurney A. Alycia Rossetti, MD;  Location: [facility];  Service: Gynecology;  Laterality: Right;  . TOTAL KNEE ARTHROPLASTY  08/11/2011   Procedure: TOTAL KNEE ARTHROPLASTY;  Surgeon: Lorn Junes, MD;  Location: [facility];  Service: Orthopedics;  Laterality: Right;  DR [place] THIS CASE  . TOTAL KNEE ARTHROPLASTY Left 09/11/2014  . TOTAL KNEE ARTHROPLASTY Left 09/11/2014   Procedure: TOTAL KNEE ARTHROPLASTY;  Surgeon: Elsie Saas, MD;  Location: [facility];  Service: Orthopedics;  Laterality: Left;  . WISDOM TOOTH EXTRACTION      Social History   Social History  . Marital status: Divorced    Spouse name: N/A  . Number of children: N/A  . Years of education: N/A   Social History Main Topics  .  Smoking status: Never Smoker  . Smokeless tobacco: Never Used  . Alcohol use No  . Drug use: No  . Sexual activity: No     Comment: retired. has 2 daughters   Other Topics Concern  . Not on file   Social History Narrative   No regular exercise    Family History  Problem Relation Age of Onset  . Kidney disease Mother     ? hypertensive  . Hypertension Mother   . Deep vein thrombosis Mother     post ankle fracture  . Leukemia Father   . Colon cancer Maternal Grandmother   . Diabetes Other     cousins  . Anesthesia problems Neg Hx   .  Hypotension Neg Hx   . Malignant hyperthermia Neg Hx   . Pseudochol deficiency Neg Hx   . Stroke Neg Hx   . Heart disease Neg Hx     Review of Systems  Constitutional: Negative for chills and fever.  Respiratory: Negative for cough, shortness of breath and wheezing.   Cardiovascular: Positive for leg swelling. Negative for chest pain and palpitations.  Gastrointestinal:       Jerrye Bushy occasionally  Neurological: Negative for dizziness, light-headedness and headaches.       Objective:   Vitals:   12/24/15 0936  BP: 134/66  Pulse: 67  Resp: 16  Temp: 97.8 F (36.6 C)   Filed Weights   12/24/15 0936  Weight: 188 lb (85.3 kg)   Body mass index is 36.72 kg/m.   Physical Exam    Constitutional: Appears well-developed and well-nourished. No distress.  HENT:  Head: Normocephalic and atraumatic.  Neck: Bilateral ear canals and TM normal,  Neck supple. No tracheal deviation present. No thyromegaly present.  Cardiovascular: Normal rate, regular rhythm and normal heart sounds.   No murmur heard.  No carotid bruit.  2 + nonpitting edema b/l L > R  Pulmonary/Chest: Effort normal and breath sounds normal. No respiratory distress. No has no wheezes. No rales.  Lymphadenopathy: No cervical adenopathy.  Skin: Skin is warm and dry. Not diaphoretic.  Psychiatric: Normal mood and affect. Behavior is normal.     Assessment & Plan:    Reassured about hearing heart beat in her ear.  If usually occurs when she lays down.  Will monitor for now.   See Problem List for Assessment and Plan of chronic medical problems.

## 2015-12-23 NOTE — Assessment & Plan Note (Addendum)
Likely multifactorial in nature Amlodipine d/c'd last visit Lasix 20 mg daily Stressed low salt diet - start reading labels Elevate legs when sitting Compression socks discussed and encouraged - she does not think she could tolerate them , but may be able to tolerate a lighter pressure compression sock Continue regular walking cmp  Continue lasix at current dose - will try to avoid increasing if possible

## 2015-12-23 NOTE — Patient Instructions (Addendum)

## 2015-12-24 ENCOUNTER — Encounter: Payer: Self-pay | Admitting: Internal Medicine

## 2015-12-24 ENCOUNTER — Other Ambulatory Visit (INDEPENDENT_AMBULATORY_CARE_PROVIDER_SITE_OTHER): Payer: Medicare Other

## 2015-12-24 ENCOUNTER — Ambulatory Visit (INDEPENDENT_AMBULATORY_CARE_PROVIDER_SITE_OTHER): Payer: Medicare Other | Admitting: Internal Medicine

## 2015-12-24 VITALS — BP 134/66 | HR 67 | Temp 97.8°F | Resp 16 | Wt 188.0 lb

## 2015-12-24 DIAGNOSIS — K219 Gastro-esophageal reflux disease without esophagitis: Secondary | ICD-10-CM

## 2015-12-24 DIAGNOSIS — E782 Mixed hyperlipidemia: Secondary | ICD-10-CM | POA: Diagnosis not present

## 2015-12-24 DIAGNOSIS — I10 Essential (primary) hypertension: Secondary | ICD-10-CM | POA: Diagnosis not present

## 2015-12-24 DIAGNOSIS — R600 Localized edema: Secondary | ICD-10-CM

## 2015-12-24 DIAGNOSIS — Z86718 Personal history of other venous thrombosis and embolism: Secondary | ICD-10-CM | POA: Diagnosis not present

## 2015-12-24 LAB — COMPREHENSIVE METABOLIC PANEL
ALT: 10 U/L (ref 0–35)
AST: 15 U/L (ref 0–37)
Albumin: 4 g/dL (ref 3.5–5.2)
Alkaline Phosphatase: 89 U/L (ref 39–117)
BUN: 10 mg/dL (ref 6–23)
CO2: 32 mEq/L (ref 19–32)
Calcium: 9.4 mg/dL (ref 8.4–10.5)
Chloride: 106 mEq/L (ref 96–112)
Creatinine, Ser: 0.78 mg/dL (ref 0.40–1.20)
GFR: 76.67 mL/min (ref >60.00)
Glucose, Bld: 97 mg/dL (ref 70–99)
Potassium: 4 mEq/L (ref 3.5–5.1)
Sodium: 144 mEq/L (ref 135–145)
Total Bilirubin: 0.5 mg/dL (ref 0.2–1.2)
Total Protein: 7.5 g/dL (ref 6.0–8.3)

## 2015-12-24 LAB — LIPID PANEL
Cholesterol: 157 mg/dL (ref 0–200)
HDL: 57.1 mg/dL (ref >39.00)
LDL Cholesterol: 70 mg/dL (ref 0–99)
NonHDL: 100.21
Total CHOL/HDL Ratio: 3
Triglycerides: 149 mg/dL (ref 0.0–149.0)
VLDL: 29.8 mg/dL (ref 0.0–40.0)

## 2015-12-24 NOTE — Assessment & Plan Note (Signed)
Check lipid panel; continue statin 

## 2015-12-24 NOTE — Progress Notes (Signed)
Pre visit review using our clinic review tool, if applicable. No additional management support is needed unless otherwise documented below in the visit note. 

## 2015-12-24 NOTE — Assessment & Plan Note (Signed)
Controlled Using medication only as needed Continue same

## 2015-12-24 NOTE — Assessment & Plan Note (Signed)
Continue lifelong warfarin 

## 2015-12-24 NOTE — Assessment & Plan Note (Signed)
BP Readings from Last 3 Encounters:  12/24/15 134/66  09/03/15 138/78  06/21/15 138/76    BP well controlled Current regimen effective and well tolerated Continue current medications at current doses

## 2016-01-16 ENCOUNTER — Ambulatory Visit (INDEPENDENT_AMBULATORY_CARE_PROVIDER_SITE_OTHER): Payer: Medicare Other | Admitting: General Practice

## 2016-01-16 DIAGNOSIS — Z7901 Long term (current) use of anticoagulants: Secondary | ICD-10-CM

## 2016-01-16 DIAGNOSIS — Z5181 Encounter for therapeutic drug level monitoring: Secondary | ICD-10-CM

## 2016-01-16 DIAGNOSIS — Z86718 Personal history of other venous thrombosis and embolism: Secondary | ICD-10-CM | POA: Diagnosis not present

## 2016-01-16 LAB — POCT INR: INR: 3.5

## 2016-01-16 NOTE — Progress Notes (Signed)
I have reviewed and agree with the plan. 

## 2016-01-16 NOTE — Patient Instructions (Signed)
Pre visit review using our clinic review tool, if applicable. No additional management support is needed unless otherwise documented below in the visit note. 

## 2016-01-18 ENCOUNTER — Other Ambulatory Visit: Payer: Self-pay | Admitting: *Deleted

## 2016-01-21 ENCOUNTER — Telehealth: Payer: Self-pay | Admitting: Emergency Medicine

## 2016-01-21 DIAGNOSIS — H93A9 Pulsatile tinnitus, unspecified ear: Secondary | ICD-10-CM

## 2016-01-21 NOTE — Telephone Encounter (Signed)
Please advise 

## 2016-01-21 NOTE — Telephone Encounter (Signed)
Pt called and stated she is still having problems with her left ear. She is wondering what to do about this. She asked that you give her a call back. Appt was offered but she turned it down. Please advise thanks.

## 2016-01-22 NOTE — Telephone Encounter (Signed)
The two most common causes are possible blockage in the artery in her neck or hearing loss that she may not realize she has.   I have ordered an [place] of her neck arteries and I have ordered a referral to ENT so we can rule both out

## 2016-01-22 NOTE — Addendum Note (Signed)
Addended by: [name] on: 01/22/2016 11:53 AM   Modules accepted: Orders

## 2016-01-22 NOTE — Telephone Encounter (Signed)
Spoke with pt to inform.  

## 2016-01-22 NOTE — Telephone Encounter (Signed)
Call her - can she remind me of her ear symptoms

## 2016-01-22 NOTE — Telephone Encounter (Signed)
Pt states her left ear feeling like her "heart beating in ear" she denies any pain. It is the worst when she is sleeping.   Please advise if a referral should be entered for ENT? States if you think she should see her heart dr due to the veins in her neck, she already has a heart dr.

## 2016-01-31 ENCOUNTER — Ambulatory Visit (HOSPITAL_COMMUNITY)
Admission: RE | Admit: 2016-01-31 | Discharge: 2016-01-31 | Disposition: A | Discharge status: Home / Self Care | Payer: Medicare Other | Source: Ambulatory Visit | Attending: Cardiology | Admitting: Cardiology

## 2016-01-31 DIAGNOSIS — I6523 Occlusion and stenosis of bilateral carotid arteries: Secondary | ICD-10-CM | POA: Diagnosis not present

## 2016-01-31 DIAGNOSIS — I10 Essential (primary) hypertension: Secondary | ICD-10-CM | POA: Diagnosis not present

## 2016-01-31 DIAGNOSIS — I739 Peripheral vascular disease, unspecified: Secondary | ICD-10-CM | POA: Diagnosis not present

## 2016-01-31 DIAGNOSIS — H93A9 Pulsatile tinnitus, unspecified ear: Secondary | ICD-10-CM | POA: Diagnosis not present

## 2016-01-31 DIAGNOSIS — E785 Hyperlipidemia, unspecified: Secondary | ICD-10-CM | POA: Insufficient documentation

## 2016-02-01 ENCOUNTER — Encounter: Payer: Self-pay | Admitting: Internal Medicine

## 2016-02-01 DIAGNOSIS — I779 Disorder of arteries and arterioles, unspecified: Secondary | ICD-10-CM | POA: Insufficient documentation

## 2016-02-01 DIAGNOSIS — I739 Peripheral vascular disease, unspecified: Secondary | ICD-10-CM

## 2016-02-05 DIAGNOSIS — H9312 Tinnitus, left ear: Secondary | ICD-10-CM | POA: Diagnosis not present

## 2016-02-06 ENCOUNTER — Ambulatory Visit (INDEPENDENT_AMBULATORY_CARE_PROVIDER_SITE_OTHER): Payer: Medicare Other | Admitting: General Practice

## 2016-02-06 DIAGNOSIS — Z5181 Encounter for therapeutic drug level monitoring: Secondary | ICD-10-CM | POA: Diagnosis not present

## 2016-02-06 DIAGNOSIS — Z7901 Long term (current) use of anticoagulants: Secondary | ICD-10-CM | POA: Diagnosis not present

## 2016-02-06 DIAGNOSIS — Z86718 Personal history of other venous thrombosis and embolism: Secondary | ICD-10-CM

## 2016-02-06 LAB — POCT INR: INR: 2.5

## 2016-02-06 NOTE — Patient Instructions (Signed)
Pre visit review using our clinic review tool, if applicable. No additional management support is needed unless otherwise documented below in the visit note. 

## 2016-02-06 NOTE — Progress Notes (Signed)
I have reviewed and agree with the plan. 

## 2016-02-25 DIAGNOSIS — Z1231 Encounter for screening mammogram for malignant neoplasm of breast: Secondary | ICD-10-CM | POA: Diagnosis not present

## 2016-02-25 LAB — HM MAMMOGRAPHY

## 2016-03-05 ENCOUNTER — Ambulatory Visit (INDEPENDENT_AMBULATORY_CARE_PROVIDER_SITE_OTHER): Payer: Medicare Other | Admitting: General Practice

## 2016-03-05 DIAGNOSIS — Z7901 Long term (current) use of anticoagulants: Secondary | ICD-10-CM

## 2016-03-05 DIAGNOSIS — Z5181 Encounter for therapeutic drug level monitoring: Secondary | ICD-10-CM

## 2016-03-05 DIAGNOSIS — Z86718 Personal history of other venous thrombosis and embolism: Secondary | ICD-10-CM | POA: Diagnosis not present

## 2016-03-05 LAB — POCT INR: INR: 2.2

## 2016-03-05 NOTE — Patient Instructions (Signed)
Pre visit review using our clinic review tool, if applicable. No additional management support is needed unless otherwise documented below in the visit note. 

## 2016-03-05 NOTE — Progress Notes (Signed)
I have reviewed and agree with the plan. 

## 2016-03-06 ENCOUNTER — Encounter: Payer: Self-pay | Admitting: Internal Medicine

## 2016-03-18 ENCOUNTER — Encounter: Payer: Self-pay | Admitting: Internal Medicine

## 2016-04-02 ENCOUNTER — Ambulatory Visit: Payer: Medicare Other

## 2016-04-04 ENCOUNTER — Ambulatory Visit (INDEPENDENT_AMBULATORY_CARE_PROVIDER_SITE_OTHER): Payer: Medicare Other | Admitting: General Practice

## 2016-04-04 DIAGNOSIS — Z86718 Personal history of other venous thrombosis and embolism: Secondary | ICD-10-CM

## 2016-04-04 DIAGNOSIS — Z7901 Long term (current) use of anticoagulants: Secondary | ICD-10-CM

## 2016-04-04 DIAGNOSIS — Z5181 Encounter for therapeutic drug level monitoring: Secondary | ICD-10-CM

## 2016-04-04 LAB — POCT INR: INR: 2.3

## 2016-04-04 NOTE — Progress Notes (Signed)
I have reviewed and agree with the plan. 

## 2016-04-04 NOTE — Patient Instructions (Signed)
Pre visit review using our clinic review tool, if applicable. No additional management support is needed unless otherwise documented below in the visit note. 

## 2016-04-07 ENCOUNTER — Other Ambulatory Visit: Payer: Self-pay | Admitting: Internal Medicine

## 2016-04-30 ENCOUNTER — Ambulatory Visit (INDEPENDENT_AMBULATORY_CARE_PROVIDER_SITE_OTHER): Payer: Medicare Other | Admitting: General Practice

## 2016-04-30 DIAGNOSIS — Z86718 Personal history of other venous thrombosis and embolism: Secondary | ICD-10-CM

## 2016-04-30 DIAGNOSIS — Z7901 Long term (current) use of anticoagulants: Secondary | ICD-10-CM

## 2016-04-30 DIAGNOSIS — Z5181 Encounter for therapeutic drug level monitoring: Secondary | ICD-10-CM

## 2016-04-30 LAB — POCT INR: INR: 2.4

## 2016-04-30 NOTE — Progress Notes (Signed)
I have reviewed and agree with the plan. 

## 2016-04-30 NOTE — Patient Instructions (Signed)
Pre visit review using our clinic review tool, if applicable. No additional management support is needed unless otherwise documented below in the visit note. 

## 2016-05-07 ENCOUNTER — Other Ambulatory Visit: Payer: Self-pay | Admitting: Internal Medicine

## 2016-06-11 ENCOUNTER — Ambulatory Visit (INDEPENDENT_AMBULATORY_CARE_PROVIDER_SITE_OTHER): Payer: Medicare Other | Admitting: General Practice

## 2016-06-11 ENCOUNTER — Other Ambulatory Visit: Payer: Self-pay | Admitting: Internal Medicine

## 2016-06-11 DIAGNOSIS — Z86718 Personal history of other venous thrombosis and embolism: Secondary | ICD-10-CM

## 2016-06-11 DIAGNOSIS — Z5181 Encounter for therapeutic drug level monitoring: Secondary | ICD-10-CM

## 2016-06-11 DIAGNOSIS — Z7901 Long term (current) use of anticoagulants: Secondary | ICD-10-CM

## 2016-06-11 LAB — POCT INR: INR: 3.8

## 2016-06-11 NOTE — Patient Instructions (Signed)
Pre visit review using our clinic review tool, if applicable. No additional management support is needed unless otherwise documented below in the visit note. 

## 2016-06-11 NOTE — Progress Notes (Signed)
I have reviewed and agree with the plan. 

## 2016-06-24 NOTE — Progress Notes (Signed)
Subjective:    Patient ID: Bonnie Zhang, female    DOB: [birth year], 76 y.o.   MRN: [id number]  HPI The patient is here for follow up.   Needs labs  Hypertension: She is taking her medication daily. She is compliant with a low sodium diet.  She denies chest pain, palpitations, edema, shortness of breath and regular headaches. She is exercising regularly - doing some walking.  She does monitor her blood pressure at home 140's/70's.    GERD:  She is taking her medication daily as prescribed.  She denies any GERD symptoms and feels her GERD is well controlled.   Hyperlipidemia: She is taking her medication daily. She is compliant with a low fat/cholesterol diet. She is exercising regularly - doing some walking. She denies myalgias.   Osteoporosis:  She has started walking about three days a week at [employer].  She is taking vitamin d daily, but does not take calcium.    B/L leg edema:  She is taking lasix daily as prescribed. She feels her edema is controlled, but she still has some swelling.  She elevates her legs when sitting.   Medications and allergies reviewed with patient and updated if appropriate.  Patient Active Problem List   Diagnosis Date Noted  . Carotid artery disease ([place]) 02/01/2016  . Bilateral leg edema 06/21/2015  . Vitamin B12 deficiency (non anemic) 01/24/2015  . 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 Primary localized osteoarthritis of left knee   . Encounter for therapeutic drug monitoring 06/07/2013  . Glucose intolerance (impaired glucose tolerance) 04/14/2013  . GERD (gastroesophageal reflux disease)   . PVD (peripheral vascular disease) ([place])   . 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 10/09/2010  . Long term current use of anticoagulant 05/01/2010  . Osteoporosis 03/21/2010  . History of colonic polyps 03/20/2008  . Vitamin D deficiency 08/25/2007  . HYPERLIPIDEMIA  03/18/2007  . Dysmetabolic syndrome X 18/84/1660  . Essential hypertension 03/18/2007  . DVT, HX OF 07/23/2006    Current Outpatient Prescriptions on File Prior to Visit  Medication Sig Dispense Refill  . benazepril (LOTENSIN) 20 MG tablet TAKE ONE TABLET BY MOUTH ONCE DAILY 90 tablet 0  . Cholecalciferol 2000 UNITS CAPS Take 2,000 Units by mouth daily.     . folic acid (FOLVITE) 1 MG tablet Take 1 tablet (1 mg total) by mouth daily. 90 tablet 3  . furosemide (LASIX) 20 MG tablet TAKE ONE TABLET BY MOUTH ONCE DAILY 90 tablet 0  . metoprolol succinate (TOPROL-XL) 50 MG 24 hr tablet Take 1 tablet (50 mg total) by mouth daily. --- Office visit needed for further refills 90 tablet 0  . pantoprazole (PROTONIX) 40 MG tablet Take 1 tablet (40 mg total) by mouth daily as needed. Acid reflux 30 tablet 5  . simvastatin (ZOCOR) 10 MG tablet TAKE ONE TABLET BY MOUTH AT BEDTIME 90 tablet 0  . vitamin B-12 (CYANOCOBALAMIN) 1000 MCG tablet Take 1 tablet (1,000 mcg total) by mouth daily. 90 tablet 3  . warfarin (COUMADIN) 5 MG tablet Take as directed by anticoagulation clinic 90 tablet 1   No current facility-administered medications on file prior to visit.     Past Medical History:  Diagnosis Date  . Anticoagulated on Coumadin 2001   2 blood clots  . Bruises easily    pt is on Coumadin;last dose of Coumadin 08/05/11 and then Lovenox started 08/07/11  . DVT (deep vein thrombosis)  in pregnancy ([place])     1965 post C section;2001 with prolonged driving while on  HRT  . DVT (deep venous thrombosis) ([place]) 2001   Was on Prempro.    Marland Kitchen GERD (gastroesophageal reflux disease)    Protonix prn  . High cholesterol    takes Simvastating daily  . History of colon polyps 2007   Dr Sharlett Iles  . 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 Hx of colonic polyps    Dr Sharlett Iles  . Hx of migraines    yrs ago   . Hypertension    takes Benazepril,Amlodipine,and Metoprolol daily  . Osteoporosis   . PONV (postoperative  nausea and vomiting)   . Primary localized osteoarthritis of left knee   . 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 Right knee DJD    knees  . Shortness of breath dyspnea   . Vitamin B12 deficiency (non anemic) 01/24/2015  . Vitamin D deficiency    takes VIt D daily    Past Surgical History:  Procedure Laterality Date  . ABDOMINAL HYSTERECTOMY    . APPENDECTOMY  1965  . BREAST LUMPECTOMY  1987  . CESAREAN SECTION  1961/1965/1966   X 3  . CHOLECYSTECTOMY  1997  . COLONOSCOPY W/ POLYPECTOMY  2007   Dr  Sharlett Iles; due? 2012  . DILATION AND CURETTAGE OF UTERUS  2012   uterine polyp, Dr Kennon Rounds  . ESOPHAGOGASTRODUODENOSCOPY  1997  . HYSTEROSCOPY W/D&C N/A 08/05/2012   Procedure: DILATATION AND CURETTAGE /HYSTEROSCOPY;  Surgeon: Donnamae Jude, MD;  Location: [facility];  Service: Gynecology;  Laterality: N/A;  . ROBOTIC ASSISTED TOTAL HYSTERECTOMY WITH BILATERAL SALPINGO OOPHERECTOMY Right 09/14/2012   Procedure: ROBOTIC ASSISTED TOTAL HYSTERECTOMY WITH BILATERAL SALPINGO OOPHORECTOMY ,right pelvic LYMPHNODE dissection;  Surgeon: Imagene Gurney A. Alycia Rossetti, MD;  Location: [facility];  Service: Gynecology;  Laterality: Right;  . TOTAL KNEE ARTHROPLASTY  08/11/2011   Procedure: TOTAL KNEE ARTHROPLASTY;  Surgeon: Lorn Junes, MD;  Location: [facility];  Service: Orthopedics;  Laterality: Right;  DR [place] THIS CASE  . TOTAL KNEE ARTHROPLASTY Left 09/11/2014  . TOTAL KNEE ARTHROPLASTY Left 09/11/2014   Procedure: TOTAL KNEE ARTHROPLASTY;  Surgeon: Elsie Saas, MD;  Location: [facility];  Service: Orthopedics;  Laterality: Left;  . WISDOM TOOTH EXTRACTION      Social History   Social History  . Marital status: Divorced    Spouse name: N/A  . Number of children: N/A  . Years of education: N/A   Social History Main Topics  . Smoking status: Never Smoker  . Smokeless tobacco: Never Used  . Alcohol use No  . Drug  use: No  . Sexual activity: No     Comment: retired. has 2 daughters   Other Topics Concern  . Not on file   Social History Narrative   No regular exercise    Family History  Problem Relation Age of Onset  . Kidney disease Mother     ? hypertensive  . Hypertension Mother   . Deep vein thrombosis Mother     post ankle fracture  . Leukemia Father   . Colon cancer Maternal Grandmother   . Diabetes Other     cousins  . Anesthesia problems Neg Hx   . Hypotension Neg Hx   . Malignant hyperthermia Neg Hx   . Pseudochol deficiency Neg Hx   . Stroke Neg Hx   .  Heart disease Neg Hx     Review of Systems  Constitutional: Negative for chills and fever.  Respiratory: Negative for cough, shortness of breath and wheezing.   Cardiovascular: Positive for leg swelling (controlled iwth lasix). Negative for chest pain and palpitations.  Neurological: Negative for light-headedness and headaches.       Objective:   Vitals:   06/25/16 0806  BP: (!) 156/72  Pulse: 72  Resp: 16  Temp: 98 F (36.7 C)   Wt Readings from Last 3 Encounters:  06/25/16 186 lb (84.4 kg)  12/24/15 188 lb (85.3 kg)  09/03/15 187 lb (84.8 kg)   Body mass index is 36.33 kg/m.   Physical Exam    Constitutional: Appears well-developed and well-nourished. No distress.  HENT:  Head: Normocephalic and atraumatic.  Neck: Neck supple. No tracheal deviation present. No thyromegaly present.  No cervical lymphadenopathy Cardiovascular: Normal rate, regular rhythm and normal heart sounds.   No murmur heard. No carotid bruit .  mild edema Pulmonary/Chest: Effort normal and breath sounds normal. No respiratory distress. No has no wheezes. No rales.  Skin: Skin is warm and dry. Not diaphoretic.  Psychiatric: Normal mood and affect. Behavior is normal.      Assessment & Plan:    See Problem List for Assessment and Plan of chronic medical problems.   FU in 6 months

## 2016-06-24 NOTE — Patient Instructions (Addendum)

## 2016-06-25 ENCOUNTER — Ambulatory Visit (INDEPENDENT_AMBULATORY_CARE_PROVIDER_SITE_OTHER): Payer: Medicare Other | Admitting: Internal Medicine

## 2016-06-25 ENCOUNTER — Other Ambulatory Visit (INDEPENDENT_AMBULATORY_CARE_PROVIDER_SITE_OTHER): Payer: Medicare Other

## 2016-06-25 ENCOUNTER — Encounter: Payer: Self-pay | Admitting: Emergency Medicine

## 2016-06-25 ENCOUNTER — Encounter: Payer: Self-pay | Admitting: Internal Medicine

## 2016-06-25 VITALS — BP 156/72 | HR 72 | Temp 98.0°F | Resp 16 | Wt 186.0 lb

## 2016-06-25 DIAGNOSIS — M810 Age-related osteoporosis without current pathological fracture: Secondary | ICD-10-CM | POA: Diagnosis not present

## 2016-06-25 DIAGNOSIS — I10 Essential (primary) hypertension: Secondary | ICD-10-CM

## 2016-06-25 DIAGNOSIS — K219 Gastro-esophageal reflux disease without esophagitis: Secondary | ICD-10-CM

## 2016-06-25 DIAGNOSIS — E782 Mixed hyperlipidemia: Secondary | ICD-10-CM

## 2016-06-25 DIAGNOSIS — R600 Localized edema: Secondary | ICD-10-CM | POA: Diagnosis not present

## 2016-06-25 LAB — COMPREHENSIVE METABOLIC PANEL
ALT: 10 U/L (ref 0–35)
AST: 19 U/L (ref 0–37)
Albumin: 3.9 g/dL (ref 3.5–5.2)
Alkaline Phosphatase: 85 U/L (ref 39–117)
BUN: 10 mg/dL (ref 6–23)
CO2: 28 mEq/L (ref 19–32)
Calcium: 9.2 mg/dL (ref 8.4–10.5)
Chloride: 105 mEq/L (ref 96–112)
Creatinine, Ser: 0.79 mg/dL (ref 0.40–1.20)
GFR: 75.44 mL/min (ref >60.00)
Glucose, Bld: 110 mg/dL — ABNORMAL HIGH (ref 70–99)
Potassium: 3.8 mEq/L (ref 3.5–5.1)
Sodium: 141 mEq/L (ref 135–145)
Total Bilirubin: 0.6 mg/dL (ref 0.2–1.2)
Total Protein: 7.3 g/dL (ref 6.0–8.3)

## 2016-06-25 LAB — LIPID PANEL
Cholesterol: 148 mg/dL (ref 0–200)
HDL: 53.9 mg/dL (ref >39.00)
LDL Cholesterol: 65 mg/dL (ref 0–99)
NonHDL: 94.27
Total CHOL/HDL Ratio: 3
Triglycerides: 148 mg/dL (ref 0.0–149.0)
VLDL: 29.6 mg/dL (ref 0.0–40.0)

## 2016-06-25 LAB — TSH: TSH: 1.64 u[IU]/mL (ref 0.35–4.50)

## 2016-06-25 MED ORDER — FUROSEMIDE 20 MG PO TABS: 20.0000 mg | ORAL_TABLET | Freq: Every day | ORAL | 1 refills | Status: DC

## 2016-06-25 MED ORDER — SIMVASTATIN 10 MG PO TABS: 10.0000 mg | ORAL_TABLET | Freq: Every day | ORAL | 1 refills | Status: DC

## 2016-06-25 MED ORDER — PANTOPRAZOLE SODIUM 40 MG PO TBEC: 40.0000 mg | DELAYED_RELEASE_TABLET | Freq: Every day | ORAL | 1 refills | Status: DC | PRN

## 2016-06-25 MED ORDER — METOPROLOL SUCCINATE ER 50 MG PO TB24: 50.0000 mg | ORAL_TABLET | Freq: Every day | ORAL | 1 refills | Status: DC

## 2016-06-25 MED ORDER — BENAZEPRIL HCL 20 MG PO TABS: 20.0000 mg | ORAL_TABLET | Freq: Every day | ORAL | 1 refills | Status: DC

## 2016-06-25 NOTE — Assessment & Plan Note (Addendum)
Controlled with lasix 20 mg daily Exercise regularly  Work on weight loss Does not tolerate compression socks Elevate legs when sitting cmp

## 2016-06-25 NOTE — Assessment & Plan Note (Signed)
Check lipid panel  Continue daily statin Regular exercise and healthy diet encouraged  

## 2016-06-25 NOTE — Assessment & Plan Note (Signed)
Intermittent Takes protonix as needed only Continue above

## 2016-06-25 NOTE — Assessment & Plan Note (Signed)
Taking vitamin d daily getting calcium through diet - ? Not sure if she gets enough Goal 1200 mg calcium / day Walking dexa ordered

## 2016-06-25 NOTE — Assessment & Plan Note (Addendum)
BP borderline today and better controlled at home Monitor at home Continue current medications cmp today, tsh today Continue walking for exercise Work on weight loss

## 2016-06-25 NOTE — Progress Notes (Signed)
Pre visit review using our clinic review tool, if applicable. No additional management support is needed unless otherwise documented below in the visit note. 

## 2016-07-09 ENCOUNTER — Ambulatory Visit (INDEPENDENT_AMBULATORY_CARE_PROVIDER_SITE_OTHER): Payer: Medicare Other | Admitting: General Practice

## 2016-07-09 ENCOUNTER — Other Ambulatory Visit: Payer: Self-pay | Admitting: General Practice

## 2016-07-09 DIAGNOSIS — Z5181 Encounter for therapeutic drug level monitoring: Secondary | ICD-10-CM

## 2016-07-09 DIAGNOSIS — Z86718 Personal history of other venous thrombosis and embolism: Secondary | ICD-10-CM

## 2016-07-09 DIAGNOSIS — Z7901 Long term (current) use of anticoagulants: Secondary | ICD-10-CM

## 2016-07-09 LAB — POCT INR: INR: 2.9

## 2016-07-09 MED ORDER — WARFARIN SODIUM 5 MG PO TABS: ORAL_TABLET | ORAL | 1 refills | Status: DC

## 2016-07-09 NOTE — Patient Instructions (Signed)
Pre visit review using our clinic review tool, if applicable. No additional management support is needed unless otherwise documented below in the visit note. 

## 2016-07-09 NOTE — Progress Notes (Signed)
I have reviewed and agree with the plan. 

## 2016-07-11 ENCOUNTER — Encounter: Payer: Self-pay | Admitting: Internal Medicine

## 2016-07-11 DIAGNOSIS — M810 Age-related osteoporosis without current pathological fracture: Secondary | ICD-10-CM | POA: Diagnosis not present

## 2016-07-11 DIAGNOSIS — Z13820 Encounter for screening for osteoporosis: Secondary | ICD-10-CM | POA: Diagnosis not present

## 2016-07-22 ENCOUNTER — Telehealth: Payer: Self-pay | Admitting: Internal Medicine

## 2016-07-22 MED ORDER — AMLODIPINE BESYLATE 2.5 MG PO TABS: 2.5000 mg | ORAL_TABLET | Freq: Every day | ORAL | 0 refills | Status: DC

## 2016-07-22 NOTE — Telephone Encounter (Signed)
Is she ok with starting amlodipine at a low dose of 2.5 mg daily.  We may need to increase it if needed.  I am not sure if she was on this in the past or not.

## 2016-07-22 NOTE — Telephone Encounter (Signed)
Notified pt w/MD response. She agreed to start taking the 2.5 mg of amlodipine. In the past dr. Linna Darner had given her 5 mg. [facility] the 2.5 mg [facility]...[name]

## 2016-07-22 NOTE — Telephone Encounter (Signed)
Pt called in and said that bp meds arent really helping, her be is staying the same.  The top number continues  to be in the 150"s.  What should she do?

## 2016-07-27 ENCOUNTER — Telehealth: Payer: Self-pay | Admitting: Internal Medicine

## 2016-07-27 DIAGNOSIS — M810 Age-related osteoporosis without current pathological fracture: Secondary | ICD-10-CM

## 2016-07-27 NOTE — Telephone Encounter (Signed)
Her recent bone density shows osteoporosis.  She should consider medication  - she should make an appt to discuss further

## 2016-07-28 NOTE — Telephone Encounter (Signed)
LVM for pt to call back and make an appt with Dr Quay Burow to discuss medication

## 2016-08-06 ENCOUNTER — Ambulatory Visit (INDEPENDENT_AMBULATORY_CARE_PROVIDER_SITE_OTHER): Payer: Medicare Other | Admitting: General Practice

## 2016-08-06 DIAGNOSIS — Z86718 Personal history of other venous thrombosis and embolism: Secondary | ICD-10-CM

## 2016-08-06 DIAGNOSIS — Z5181 Encounter for therapeutic drug level monitoring: Secondary | ICD-10-CM

## 2016-08-06 LAB — POCT INR: INR: 3.1

## 2016-08-06 NOTE — Progress Notes (Signed)
I have reviewed and agree with the plan. 

## 2016-08-06 NOTE — Patient Instructions (Signed)
Pre visit review using our clinic review tool, if applicable. No additional management support is needed unless otherwise documented below in the visit note. 

## 2016-09-03 ENCOUNTER — Ambulatory Visit (INDEPENDENT_AMBULATORY_CARE_PROVIDER_SITE_OTHER): Payer: Medicare Other | Admitting: General Practice

## 2016-09-03 DIAGNOSIS — Z5181 Encounter for therapeutic drug level monitoring: Secondary | ICD-10-CM

## 2016-09-03 DIAGNOSIS — Z86718 Personal history of other venous thrombosis and embolism: Secondary | ICD-10-CM

## 2016-09-03 LAB — POCT INR: INR: 1.9

## 2016-09-03 NOTE — Progress Notes (Signed)
I have reviewed and agree with the plan. 

## 2016-09-03 NOTE — Patient Instructions (Signed)
Pre visit review using our clinic review tool, if applicable. No additional management support is needed unless otherwise documented below in the visit note. 

## 2016-10-03 ENCOUNTER — Ambulatory Visit: Payer: Medicare Other

## 2016-10-10 ENCOUNTER — Ambulatory Visit (INDEPENDENT_AMBULATORY_CARE_PROVIDER_SITE_OTHER): Payer: Medicare Other | Admitting: General Practice

## 2016-10-10 DIAGNOSIS — Z5181 Encounter for therapeutic drug level monitoring: Secondary | ICD-10-CM

## 2016-10-10 DIAGNOSIS — Z86718 Personal history of other venous thrombosis and embolism: Secondary | ICD-10-CM

## 2016-10-10 LAB — POCT INR: INR: 2.9

## 2016-10-15 ENCOUNTER — Other Ambulatory Visit: Payer: Self-pay | Admitting: Internal Medicine

## 2016-11-05 ENCOUNTER — Ambulatory Visit (INDEPENDENT_AMBULATORY_CARE_PROVIDER_SITE_OTHER): Payer: Medicare Other | Admitting: General Practice

## 2016-11-05 DIAGNOSIS — Z86718 Personal history of other venous thrombosis and embolism: Secondary | ICD-10-CM | POA: Diagnosis not present

## 2016-11-05 DIAGNOSIS — Z5181 Encounter for therapeutic drug level monitoring: Secondary | ICD-10-CM | POA: Diagnosis not present

## 2016-11-05 LAB — POCT INR: INR: 2.3

## 2016-11-05 NOTE — Patient Instructions (Signed)
Pre visit review using our clinic review tool, if applicable. No additional management support is needed unless otherwise documented below in the visit note. 

## 2016-11-06 NOTE — Progress Notes (Signed)
I have reviewed and agree with the plan. 

## 2016-11-18 DIAGNOSIS — L720 Epidermal cyst: Secondary | ICD-10-CM | POA: Diagnosis not present

## 2016-11-18 DIAGNOSIS — L821 Other seborrheic keratosis: Secondary | ICD-10-CM | POA: Diagnosis not present

## 2016-11-18 DIAGNOSIS — I788 Other diseases of capillaries: Secondary | ICD-10-CM | POA: Diagnosis not present

## 2016-11-18 DIAGNOSIS — D225 Melanocytic nevi of trunk: Secondary | ICD-10-CM | POA: Diagnosis not present

## 2016-11-28 DIAGNOSIS — L720 Epidermal cyst: Secondary | ICD-10-CM | POA: Diagnosis not present

## 2016-12-03 ENCOUNTER — Ambulatory Visit (INDEPENDENT_AMBULATORY_CARE_PROVIDER_SITE_OTHER): Payer: Medicare Other | Admitting: General Practice

## 2016-12-03 DIAGNOSIS — Z5181 Encounter for therapeutic drug level monitoring: Secondary | ICD-10-CM

## 2016-12-03 DIAGNOSIS — Z86718 Personal history of other venous thrombosis and embolism: Secondary | ICD-10-CM

## 2016-12-03 LAB — POCT INR: INR: 1.5

## 2016-12-03 NOTE — Patient Instructions (Signed)
Pre visit review using our clinic review tool, if applicable. No additional management support is needed unless otherwise documented below in the visit note. 

## 2016-12-19 NOTE — Progress Notes (Signed)
Subjective:   Bonnie Zhang is a 76 y.o. female who presents for Medicare Annual (Subsequent) preventive examination.  Review of Systems:  No ROS.  Medicare Wellness Visit. Additional risk factors are reflected in the social history.  Cardiac Risk Factors include: advanced age (>15men, >30 women);dyslipidemia;obesity (BMI >30kg/m2) Sleep patterns: feels rested on waking, gets up 1 times nightly to void and sleeps 6 hours nightly.    Home Safety/Smoke Alarms: Feels safe in home. Smoke alarms in place.  Living environment; residence and Firearm Safety: 1-story house/ trailer, no firearms.Lives alone, no needs for DME, good support system Seat Belt Safety/Bike Helmet: Wears seat belt.     Objective:     Vitals: BP 126/74   Pulse (!) 59   Resp 20   Ht 5' (1.524 m)   Wt 175 lb (79.4 kg)   SpO2 98%   BMI 34.18 kg/m   Body mass index is 34.18 kg/m.   Tobacco History  Smoking Status  . Never Smoker  Smokeless Tobacco  . Never Used     Counseling given: Not Answered   Past Medical History:  Diagnosis Date  . Anticoagulated on Coumadin 2001   2 blood clots  . Bruises easily    pt is on Coumadin;last dose of Coumadin 08/05/11 and then Lovenox started 08/07/11  . DVT (deep vein thrombosis) in pregnancy ([place])     1965 post C section;2001 with prolonged driving while on  HRT  . DVT (deep venous thrombosis) ([place]) 2001   Was on Prempro.    Marland Kitchen GERD (gastroesophageal reflux disease)    Protonix prn  . High cholesterol    takes Simvastating daily  . History of colon polyps 2007   Dr Sharlett Iles  . 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 Hx of colonic polyps    Dr Sharlett Iles  . Hx of migraines    yrs ago   . Hypertension    takes Benazepril,Amlodipine,and Metoprolol daily  . Osteoporosis   . PONV (postoperative nausea and vomiting)   . Primary localized osteoarthritis of left knee   . PVD (peripheral vascular disease) ([place]) 1965, 2001   abnormal venous doppler findings due  to recurrent DVT'S left leg  . Right knee DJD    knees  . Shortness of breath dyspnea   . Vitamin B12 deficiency (non anemic) 01/24/2015  . Vitamin D deficiency    takes VIt D daily   Past Surgical History:  Procedure Laterality Date  . ABDOMINAL HYSTERECTOMY    . APPENDECTOMY  1965  . BREAST LUMPECTOMY  1987  . CESAREAN SECTION  1961/1965/1966   X 3  . CHOLECYSTECTOMY  1997  . COLONOSCOPY W/ POLYPECTOMY  2007   Dr  Sharlett Iles; due? 2012  . DILATION AND CURETTAGE OF UTERUS  2012   uterine polyp, Dr Kennon Rounds  . ESOPHAGOGASTRODUODENOSCOPY  1997  . HYSTEROSCOPY W/D&C N/A 08/05/2012   Procedure: DILATATION AND CURETTAGE /HYSTEROSCOPY;  Surgeon: Donnamae Jude, MD;  Location: [facility];  Service: Gynecology;  Laterality: N/A;  . ROBOTIC ASSISTED TOTAL HYSTERECTOMY WITH BILATERAL SALPINGO OOPHERECTOMY Right 09/14/2012   Procedure: ROBOTIC ASSISTED TOTAL HYSTERECTOMY WITH BILATERAL SALPINGO OOPHORECTOMY ,right pelvic LYMPHNODE dissection;  Surgeon: Imagene Gurney A. Alycia Rossetti, MD;  Location: [facility];  Service: Gynecology;  Laterality: Right;  . TOTAL KNEE ARTHROPLASTY  08/11/2011   Procedure: TOTAL KNEE ARTHROPLASTY;  Surgeon: Lorn Junes, MD;  Location: [facility];  Service: Orthopedics;  Laterality: Right;  DR [place] THIS CASE  .  TOTAL KNEE ARTHROPLASTY Left 09/11/2014  . TOTAL KNEE ARTHROPLASTY Left 09/11/2014   Procedure: TOTAL KNEE ARTHROPLASTY;  Surgeon: Elsie Saas, MD;  Location: [facility];  Service: Orthopedics;  Laterality: Left;  . WISDOM TOOTH EXTRACTION     Family History  Problem Relation Age of Onset  . Kidney disease Mother        ? hypertensive  . Hypertension Mother   . Deep vein thrombosis Mother        post ankle fracture  . Leukemia Father   . Colon cancer Maternal Grandmother   . Diabetes Other        cousins  . Anesthesia problems Neg Hx   . Hypotension Neg Hx   . Malignant hyperthermia Neg Hx   . Pseudochol deficiency Neg Hx   . Stroke Neg Hx   . Heart disease  Neg Hx    History  Sexual Activity  . Sexual activity: No    Comment: retired. has 2 daughters    Outpatient Encounter Prescriptions as of 12/22/2016  Medication Sig  . acetaminophen (TYLENOL) 325 MG suppository Place 325 mg rectally every 4 (four) hours as needed.  Marland Kitchen amLODipine (NORVASC) 2.5 MG tablet TAKE 1 TABLET BY MOUTH ONCE DAILY  . benazepril (LOTENSIN) 20 MG tablet Take 1 tablet (20 mg total) by mouth daily.  . Cholecalciferol 2000 UNITS CAPS Take 2,000 Units by mouth daily.   . metoprolol succinate (TOPROL-XL) 50 MG 24 hr tablet Take 1 tablet (50 mg total) by mouth daily.  . pantoprazole (PROTONIX) 40 MG tablet Take 1 tablet (40 mg total) by mouth daily as needed. Acid reflux  . simvastatin (ZOCOR) 10 MG tablet Take 1 tablet (10 mg total) by mouth at bedtime.  Marland Kitchen warfarin (COUMADIN) 5 MG tablet Take as directed by anticoagulation clinic  . [DISCONTINUED] folic acid (FOLVITE) 1 MG tablet Take 1 tablet (1 mg total) by mouth daily. (Patient not taking: Reported on 12/22/2016)  . [DISCONTINUED] furosemide (LASIX) 20 MG tablet Take 1 tablet (20 mg total) by mouth daily. (Patient not taking: Reported on 12/22/2016)  . [DISCONTINUED] vitamin B-12 (CYANOCOBALAMIN) 1000 MCG tablet Take 1 tablet (1,000 mcg total) by mouth daily. (Patient not taking: Reported on 12/22/2016)   No facility-administered encounter medications on file as of 12/22/2016.     Activities of Daily Living In your present state of health, do you have any difficulty performing the following activities: 12/22/2016  Hearing? N  Vision? N  Difficulty concentrating or making decisions? N  Walking or climbing stairs? N  Dressing or bathing? N  Doing errands, shopping? N  Preparing Food and eating ? N  Using the Toilet? N  In the past six months, have you accidently leaked urine? N  Do you have problems with loss of bowel control? N  Managing your Medications? N  Managing your Finances? N  Housekeeping or managing your  Housekeeping? N  Some recent data might be hidden    Patient Care Team: [name], MD as PCP - General (Internal Medicine) Josue Hector, MD as Consulting Physician (Cardiology) Heath Lark, MD as Consulting Physician (Hematology and Oncology)    Assessment:    Physical assessment deferred to PCP.  Exercise Activities and Dietary recommendations Current Exercise Habits: Home exercise routine, Type of exercise: walking, Time (Minutes): 25, Frequency (Times/Week): 4, Weekly Exercise (Minutes/Week): 100, Intensity: Mild, Exercise limited by: orthopedic condition(s)  Diet (meal preparation, eat out, water intake, caffeinated beverages, dairy products, fruits and vegetables): in general,  a "healthy" diet  , well balanced, eats a variety of fruits and vegetables daily, limits salt, fat/cholesterol, sugar, caffeine, drinks 6-8 glasses of water daily.  Discussed portion control, encouraged patient to increase daily water intake.  Goals    . Be healthy, as active and as independent as possible      Fall Risk Fall Risk  12/22/2016 09/03/2015 05/25/2014 04/14/2013 04/13/2012  Falls in the past year? No No No No Yes   Depression Screen PHQ 2/9 Scores 12/22/2016 09/03/2015 05/25/2014 04/14/2013  PHQ - 2 Score 0 0 0 0  PHQ- 9 Score 1 - - -     Cognitive Function MMSE - Mini Mental State Exam 12/22/2016  Orientation to time 5  Orientation to Place 5  Registration 3  Attention/ Calculation 5  Recall 2  Language- name 2 objects 2  Language- repeat 1  Language- follow 3 step command 3  Language- read & follow direction 1  Write a sentence 1  Copy design 1  Total score 29        Immunization History  Administered Date(s) Administered  . Td 03/21/2010   Screening Tests Health Maintenance  Topic Date Due  . INFLUENZA VACCINE  11/28/2017 (Originally 10/29/2016)  . PNA vac Low Risk Adult (1 of 2 - PCV13) 11/28/2017 (Originally 08/29/2006)  . COLONOSCOPY  04/20/2017  . DEXA SCAN   07/12/2018  . TETANUS/TDAP  03/21/2020      Plan:    Continue doing brain stimulating activities (puzzles, reading, adult coloring books, staying active) to keep memory sharp.   Continue to eat heart healthy diet (full of fruits, vegetables, whole grains, lean protein, water--limit salt, fat, and sugar intake) and increase physical activity as tolerated.   I have personally reviewed and noted the following in the patient's chart:   . Medical and social history . Use of alcohol, tobacco or illicit drugs  . Current medications and supplements . Functional ability and status . Nutritional status . Physical activity . Advanced directives . List of other physicians . Vitals . Screenings to include cognitive, depression, and falls . Referrals and appointments  In addition, I have reviewed and discussed with patient certain preventive protocols, quality metrics, and best practice recommendations. A written personalized care plan for preventive services as well as general preventive health recommendations were provided to patient.     Michiel Cowboy, RN  12/22/2016

## 2016-12-19 NOTE — Progress Notes (Signed)
Pre visit review using our clinic review tool, if applicable. No additional management support is needed unless otherwise documented below in the visit note. 

## 2016-12-22 ENCOUNTER — Ambulatory Visit (INDEPENDENT_AMBULATORY_CARE_PROVIDER_SITE_OTHER): Payer: Medicare Other | Admitting: *Deleted

## 2016-12-22 VITALS — BP 126/74 | HR 59 | Resp 20 | Ht 60.0 in | Wt 175.0 lb

## 2016-12-22 DIAGNOSIS — Z0000 Encounter for general adult medical examination without abnormal findings: Secondary | ICD-10-CM | POA: Diagnosis not present

## 2016-12-22 NOTE — Progress Notes (Addendum)
Subjective:   Bonnie Zhang is a 76 y.o. female who presents for Medicare Annual (Subsequent) preventive examination.  Review of Systems:  No ROS.  Medicare Wellness Visit. Additional risk factors are reflected in the social history.  Cardiac Risk Factors include: advanced age (>66men, >43 women);dyslipidemia;obesity (BMI >30kg/m2) Sleep patterns: feels rested on waking, gets up 1 times nightly to void and sleeps 6 hours nightly.    Home Safety/Smoke Alarms: Feels safe in home. Smoke alarms in place.  Living environment; residence and Firearm Safety: 1-story house/ trailer, no firearms.Lives alone, no needs for DME, good support system Seat Belt Safety/Bike Helmet: Wears seat belt.     Objective:     Vitals: BP 126/74   Pulse (!) 59   Resp 20   Ht 5' (1.524 m)   Wt 175 lb (79.4 kg)   SpO2 98%   BMI 34.18 kg/m   Body mass index is 34.18 kg/m.   Tobacco History  Smoking Status  . Never Smoker  Smokeless Tobacco  . Never Used     Counseling given: Not Answered   Past Medical History:  Diagnosis Date  . Anticoagulated on Coumadin 2001   2 blood clots  . Bruises easily    pt is on Coumadin;last dose of Coumadin 08/05/11 and then Lovenox started 08/07/11  . DVT (deep vein thrombosis) in pregnancy ([place])     1965 post C section;2001 with prolonged driving while on  HRT  . DVT (deep venous thrombosis) ([place]) 2001   Was on Prempro.    Marland Kitchen GERD (gastroesophageal reflux disease)    Protonix prn  . High cholesterol    takes Simvastating daily  . History of colon polyps 2007   Dr Sharlett Iles  . 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 Hx of colonic polyps    Dr Sharlett Iles  . Hx of migraines    yrs ago   . Hypertension    takes Benazepril,Amlodipine,and Metoprolol daily  . Osteoporosis   . PONV (postoperative nausea and vomiting)   . Primary localized osteoarthritis of left knee   . PVD (peripheral vascular disease) ([place]) 1965, 2001   abnormal venous doppler findings due  to recurrent DVT'S left leg  . Right knee DJD    knees  . Shortness of breath dyspnea   . Vitamin B12 deficiency (non anemic) 01/24/2015  . Vitamin D deficiency    takes VIt D daily   Past Surgical History:  Procedure Laterality Date  . ABDOMINAL HYSTERECTOMY    . APPENDECTOMY  1965  . BREAST LUMPECTOMY  1987  . CESAREAN SECTION  1961/1965/1966   X 3  . CHOLECYSTECTOMY  1997  . COLONOSCOPY W/ POLYPECTOMY  2007   Dr  Sharlett Iles; due? 2012  . DILATION AND CURETTAGE OF UTERUS  2012   uterine polyp, Dr Kennon Rounds  . ESOPHAGOGASTRODUODENOSCOPY  1997  . HYSTEROSCOPY W/D&C N/A 08/05/2012   Procedure: DILATATION AND CURETTAGE /HYSTEROSCOPY;  Surgeon: Donnamae Jude, MD;  Location: [facility];  Service: Gynecology;  Laterality: N/A;  . ROBOTIC ASSISTED TOTAL HYSTERECTOMY WITH BILATERAL SALPINGO OOPHERECTOMY Right 09/14/2012   Procedure: ROBOTIC ASSISTED TOTAL HYSTERECTOMY WITH BILATERAL SALPINGO OOPHORECTOMY ,right pelvic LYMPHNODE dissection;  Surgeon: Imagene Gurney A. Alycia Rossetti, MD;  Location: [facility];  Service: Gynecology;  Laterality: Right;  . TOTAL KNEE ARTHROPLASTY  08/11/2011   Procedure: TOTAL KNEE ARTHROPLASTY;  Surgeon: Lorn Junes, MD;  Location: [facility];  Service: Orthopedics;  Laterality: Right;  DR [place] THIS CASE  .  TOTAL KNEE ARTHROPLASTY Left 09/11/2014  . TOTAL KNEE ARTHROPLASTY Left 09/11/2014   Procedure: TOTAL KNEE ARTHROPLASTY;  Surgeon: Elsie Saas, MD;  Location: [facility];  Service: Orthopedics;  Laterality: Left;  . WISDOM TOOTH EXTRACTION     Family History  Problem Relation Age of Onset  . Kidney disease Mother        ? hypertensive  . Hypertension Mother   . Deep vein thrombosis Mother        post ankle fracture  . Leukemia Father   . Colon cancer Maternal Grandmother   . Diabetes Other        cousins  . Anesthesia problems Neg Hx   . Hypotension Neg Hx   . Malignant hyperthermia Neg Hx   . Pseudochol deficiency Neg Hx   . Stroke Neg Hx   . Heart disease  Neg Hx    History  Sexual Activity  . Sexual activity: No    Comment: retired. has 2 daughters    Outpatient Encounter Prescriptions as of 12/22/2016  Medication Sig  . acetaminophen (TYLENOL) 325 MG suppository Place 325 mg rectally every 4 (four) hours as needed.  Marland Kitchen amLODipine (NORVASC) 2.5 MG tablet TAKE 1 TABLET BY MOUTH ONCE DAILY  . benazepril (LOTENSIN) 20 MG tablet Take 1 tablet (20 mg total) by mouth daily.  . Cholecalciferol 2000 UNITS CAPS Take 2,000 Units by mouth daily.   . metoprolol succinate (TOPROL-XL) 50 MG 24 hr tablet Take 1 tablet (50 mg total) by mouth daily.  . pantoprazole (PROTONIX) 40 MG tablet Take 1 tablet (40 mg total) by mouth daily as needed. Acid reflux  . simvastatin (ZOCOR) 10 MG tablet Take 1 tablet (10 mg total) by mouth at bedtime.  Marland Kitchen warfarin (COUMADIN) 5 MG tablet Take as directed by anticoagulation clinic  . [DISCONTINUED] folic acid (FOLVITE) 1 MG tablet Take 1 tablet (1 mg total) by mouth daily. (Patient not taking: Reported on 12/22/2016)  . [DISCONTINUED] furosemide (LASIX) 20 MG tablet Take 1 tablet (20 mg total) by mouth daily. (Patient not taking: Reported on 12/22/2016)  . [DISCONTINUED] vitamin B-12 (CYANOCOBALAMIN) 1000 MCG tablet Take 1 tablet (1,000 mcg total) by mouth daily. (Patient not taking: Reported on 12/22/2016)   No facility-administered encounter medications on file as of 12/22/2016.     Activities of Daily Living In your present state of health, do you have any difficulty performing the following activities: 12/22/2016  Hearing? N  Vision? N  Difficulty concentrating or making decisions? N  Walking or climbing stairs? N  Dressing or bathing? N  Doing errands, shopping? N  Preparing Food and eating ? N  Using the Toilet? N  In the past six months, have you accidently leaked urine? N  Do you have problems with loss of bowel control? N  Managing your Medications? N  Managing your Finances? N  Housekeeping or managing your  Housekeeping? N  Some recent data might be hidden    Patient Care Team: [name], MD as PCP - General (Internal Medicine) Josue Hector, MD as Consulting Physician (Cardiology) Heath Lark, MD as Consulting Physician (Hematology and Oncology)    Assessment:    Physical assessment deferred to PCP.  Exercise Activities and Dietary recommendations Current Exercise Habits: Home exercise routine, Type of exercise: walking, Time (Minutes): 25, Frequency (Times/Week): 4, Weekly Exercise (Minutes/Week): 100, Intensity: Mild, Exercise limited by: orthopedic condition(s)  Diet (meal preparation, eat out, water intake, caffeinated beverages, dairy products, fruits and vegetables): in general,  a "healthy" diet  , well balanced, eats a variety of fruits and vegetables daily, limits salt, fat/cholesterol, sugar, caffeine, drinks 6-8 glasses of water daily.  Discussed portion control, encouraged patient to increase daily water intake.  Goals    . Be healthy, as active and as independent as possible      Fall Risk Fall Risk  12/22/2016 09/03/2015 05/25/2014 04/14/2013 04/13/2012  Falls in the past year? No No No No Yes   Depression Screen PHQ 2/9 Scores 12/22/2016 09/03/2015 05/25/2014 04/14/2013  PHQ - 2 Score 0 0 0 0  PHQ- 9 Score 1 - - -     Cognitive Function MMSE - Mini Mental State Exam 12/22/2016  Orientation to time 5  Orientation to Place 5  Registration 3  Attention/ Calculation 5  Recall 2  Language- name 2 objects 2  Language- repeat 1  Language- follow 3 step command 3  Language- read & follow direction 1  Write a sentence 1  Copy design 1  Total score 29        Immunization History  Administered Date(s) Administered  . Td 03/21/2010   Screening Tests Health Maintenance  Topic Date Due  . INFLUENZA VACCINE  11/28/2017 (Originally 10/29/2016)  . PNA vac Low Risk Adult (1 of 2 - PCV13) 11/28/2017 (Originally 08/29/2006)  . COLONOSCOPY  04/20/2017  . DEXA SCAN   07/12/2018  . TETANUS/TDAP  03/21/2020      Plan:    Continue doing brain stimulating activities (puzzles, reading, adult coloring books, staying active) to keep memory sharp.   Continue to eat heart healthy diet (full of fruits, vegetables, whole grains, lean protein, water--limit salt, fat, and sugar intake) and increase physical activity as tolerated.   I have personally reviewed and noted the following in the patient's chart:   . Medical and social history . Use of alcohol, tobacco or illicit drugs  . Current medications and supplements . Functional ability and status . Nutritional status . Physical activity . Advanced directives . List of other physicians . Vitals . Screenings to include cognitive, depression, and falls . Referrals and appointments  In addition, I have reviewed and discussed with patient certain preventive protocols, quality metrics, and best practice recommendations. A written personalized care plan for preventive services as well as general preventive health recommendations were provided to patient.     Michiel Cowboy, RN  12/22/2016   Medical screening examination/treatment/procedure(s) were performed by non-physician practitioner and as supervising physician I was immediately available for consultation/collaboration. I agree with above. [name], MD

## 2016-12-22 NOTE — Patient Instructions (Addendum)
Continue doing brain stimulating activities (puzzles, reading, adult coloring books, staying active) to keep memory sharp.   Continue to eat heart healthy diet (full of fruits, vegetables, whole grains, lean protein, water--limit salt, fat, and sugar intake) and increase physical activity as tolerated.   Bonnie Zhang , Thank you for taking time to come for your Medicare Wellness Visit. I appreciate your ongoing commitment to your health goals. Please review the following plan we discussed and let me know if I can assist you in the future.   These are the goals we discussed: Goals    . Be healthy, as active and as independendent as possible       This is a list of the screening recommended for you and due dates:  Health Maintenance  Topic Date Due  . Flu Shot  11/28/2017*  . Pneumonia vaccines (1 of 2 - PCV13) 11/28/2017*  . Colon Cancer Screening  04/20/2017  . DEXA scan (bone density measurement)  07/12/2018  . Tetanus Vaccine  03/21/2020  *Topic was postponed. The date shown is not the original due date.

## 2016-12-28 NOTE — Progress Notes (Signed)
Subjective:    Patient ID: Bonnie Zhang, female    DOB: Dec 14, 1940, 76 y.o.   MRN: [id number]  HPI The patient is here for follow up.  Osteoporosis:  She is taking calcium and vitamin d daily.  She is walking some around her home.  Her last dexa in April showed OP, which is not new.  She has never been on medication.      Hypertension: She is taking her medication daily. She is compliant with a low sodium diet.  She denies chest pain, palpitations, edema, shortness of breath and regular headaches. She is active, but not exercising regularly.  She does not monitor her blood pressure at home.    GERD:  She is taking her medication daily as prescribed.  She denies any GERD symptoms and feels her GERD is well controlled.   Hyperlipidemia: She is taking her medication daily. She is compliant with a low fat/cholesterol diet. She is not exercising regularly. She denies myalgias.   H/o DVT on warfarin:  She is taking the warfarin as prescribed.  She has her INR checked monthly.    Leg edema:  She has mild leg edema.  She stopped taking the lasix because it was not helped.  Her leg edema is stable. She elevates her legs.   Medications and allergies reviewed with patient and updated if appropriate.  Patient Active Problem List   Diagnosis Date Noted  . Carotid artery disease ([place]) 02/01/2016  . Bilateral leg edema 06/21/2015  . Vitamin B12 deficiency (non anemic) 01/24/2015  . 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 Primary localized osteoarthritis of left knee   . Encounter for therapeutic drug monitoring 06/07/2013  . GERD (gastroesophageal reflux disease)   . PVD (peripheral vascular disease) ([place])   . 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 10/09/2010  . Long term current use of anticoagulant 05/01/2010  . Osteoporosis 03/21/2010  . History of colonic polyps 03/20/2008  . Vitamin D deficiency  08/25/2007  . HYPERLIPIDEMIA 03/18/2007  . Dysmetabolic syndrome X 49/44/9675  . Essential hypertension 03/18/2007  . DVT, HX OF 07/23/2006    Current Outpatient Prescriptions on File Prior to Visit  Medication Sig Dispense Refill  . amLODipine (NORVASC) 2.5 MG tablet TAKE 1 TABLET BY MOUTH ONCE DAILY 90 tablet 0  . benazepril (LOTENSIN) 20 MG tablet Take 1 tablet (20 mg total) by mouth daily. 90 tablet 1  . Cholecalciferol 2000 UNITS CAPS Take 2,000 Units by mouth daily.     . metoprolol succinate (TOPROL-XL) 50 MG 24 hr tablet Take 1 tablet (50 mg total) by mouth daily. 90 tablet 1  . pantoprazole (PROTONIX) 40 MG tablet Take 1 tablet (40 mg total) by mouth daily as needed. Acid reflux 90 tablet 1  . simvastatin (ZOCOR) 10 MG tablet Take 1 tablet (10 mg total) by mouth at bedtime. 90 tablet 1  . warfarin (COUMADIN) 5 MG tablet Take as directed by anticoagulation clinic 90 tablet 1   No current facility-administered medications on file prior to visit.     Past Medical History:  Diagnosis Date  . Anticoagulated on Coumadin 2001   2 blood clots  . Bruises easily    pt is on Coumadin;last dose of Coumadin 08/05/11 and then Lovenox started 08/07/11  . DVT (deep vein thrombosis) in pregnancy ([place])     1965 post C section;2001 with prolonged driving while on  HRT  . DVT (deep venous  thrombosis) ([place]) 2001   Was on Prempro.    Marland Kitchen GERD (gastroesophageal reflux disease)    Protonix prn  . High cholesterol    takes Simvastating daily  . History of colon polyps 2007   Dr Sharlett Iles  . 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 Hx of colonic polyps    Dr Sharlett Iles  . Hx of migraines    yrs ago   . Hypertension    takes Benazepril,Amlodipine,and Metoprolol daily  . Osteoporosis   . PONV (postoperative nausea and vomiting)   . Primary localized osteoarthritis of left knee   . PVD (peripheral vascular disease) ([place]) 1965, 2001   abnormal venous doppler findings due to recurrent DVT'S left leg  .  Right knee DJD    knees  . Shortness of breath dyspnea   . Vitamin B12 deficiency (non anemic) 01/24/2015  . Vitamin D deficiency    takes VIt D daily    Past Surgical History:  Procedure Laterality Date  . ABDOMINAL HYSTERECTOMY    . APPENDECTOMY  1965  . BREAST LUMPECTOMY  1987  . CESAREAN SECTION  1961/1965/1966   X 3  . CHOLECYSTECTOMY  1997  . COLONOSCOPY W/ POLYPECTOMY  2007   Dr  Sharlett Iles; due? 2012  . DILATION AND CURETTAGE OF UTERUS  2012   uterine polyp, Dr Kennon Rounds  . ESOPHAGOGASTRODUODENOSCOPY  1997  . HYSTEROSCOPY W/D&C N/A 08/05/2012   Procedure: DILATATION AND CURETTAGE /HYSTEROSCOPY;  Surgeon: Donnamae Jude, MD;  Location: [facility];  Service: Gynecology;  Laterality: N/A;  . ROBOTIC ASSISTED TOTAL HYSTERECTOMY WITH BILATERAL SALPINGO OOPHERECTOMY Right 09/14/2012   Procedure: ROBOTIC ASSISTED TOTAL HYSTERECTOMY WITH BILATERAL SALPINGO OOPHORECTOMY ,right pelvic LYMPHNODE dissection;  Surgeon: Imagene Gurney A. Alycia Rossetti, MD;  Location: [facility];  Service: Gynecology;  Laterality: Right;  . TOTAL KNEE ARTHROPLASTY  08/11/2011   Procedure: TOTAL KNEE ARTHROPLASTY;  Surgeon: Lorn Junes, MD;  Location: [facility];  Service: Orthopedics;  Laterality: Right;  DR [place] THIS CASE  . TOTAL KNEE ARTHROPLASTY Left 09/11/2014  . TOTAL KNEE ARTHROPLASTY Left 09/11/2014   Procedure: TOTAL KNEE ARTHROPLASTY;  Surgeon: Elsie Saas, MD;  Location: [facility];  Service: Orthopedics;  Laterality: Left;  . WISDOM TOOTH EXTRACTION      Social History   Social History  . Marital status: Divorced    Spouse name: N/A  . Number of children: N/A  . Years of education: N/A   Social History Main Topics  . Smoking status: Never Smoker  . Smokeless tobacco: Never Used  . Alcohol use No  . Drug use: No  . Sexual activity: No     Comment: retired. has 2 daughters   Other Topics Concern  . None   Social History Narrative   No regular exercise    Family History  Problem Relation  Age of Onset  . Kidney disease Mother        ? hypertensive  . Hypertension Mother   . Deep vein thrombosis Mother        post ankle fracture  . Leukemia Father   . Colon cancer Maternal Grandmother   . Diabetes Other        cousins  . Anesthesia problems Neg Hx   . Hypotension Neg Hx   . Malignant hyperthermia Neg Hx   . Pseudochol deficiency Neg Hx   . Stroke Neg Hx   . Heart disease Neg Hx     Review of Systems     Objective:  Vitals:   12/29/16 0900  BP: 134/86  Pulse: (!) 58  Resp: 16  Temp: 98.2 F (36.8 C)  SpO2: 98%   Wt Readings from Last 3 Encounters:  12/29/16 177 lb (80.3 kg)  12/22/16 175 lb (79.4 kg)  06/25/16 186 lb (84.4 kg)   Body mass index is 34.57 kg/m.   Physical Exam    Constitutional: Appears well-developed and well-nourished. No distress.  HENT:  Head: Normocephalic and atraumatic.  Neck: Neck supple. No tracheal deviation present. No thyromegaly present.  No cervical lymphadenopathy Cardiovascular: Normal rate, regular rhythm and normal heart sounds.   No murmur heard. No carotid bruit .  Mild LLE edema, trace RLE edema Pulmonary/Chest: Effort normal and breath sounds normal. No respiratory distress. No has no wheezes. No rales.  Skin: Skin is warm and dry. Not diaphoretic.  Psychiatric: Normal mood and affect. Behavior is normal.      Assessment & Plan:    See Problem List for Assessment and Plan of chronic medical problems.

## 2016-12-28 NOTE — Patient Instructions (Addendum)
No immunizations administered today.   Medications reviewed and updated.  No changes recommended at this time.   Please followup in 6 months  Consider treatment of osteoporosis:     Denosumab injection  (Prolia) What is this medicine? DENOSUMAB (den oh sue mab) slows bone breakdown. Prolia is used to treat osteoporosis in women after menopause and in men. Delton See is used to treat a high calcium level due to cancer and to prevent bone fractures and other bone problems caused by multiple myeloma or cancer bone metastases. Delton See is also used to treat giant cell tumor of the bone. This medicine may be used for other purposes; ask your health care provider or pharmacist if you have questions. COMMON BRAND NAME(S): Prolia, XGEVA What should I tell my health care provider before I take this medicine? They need to know if you have any of these conditions: -dental disease -having surgery or tooth extraction -infection -kidney disease -low levels of calcium or Vitamin D in the blood -malnutrition -on hemodialysis -skin conditions or sensitivity -thyroid or parathyroid disease -an unusual reaction to denosumab, other medicines, foods, dyes, or preservatives -pregnant or trying to get pregnant -breast-feeding How should I use this medicine? This medicine is for injection under the skin. It is given by a health care professional in a hospital or clinic setting. If you are getting Prolia, a special MedGuide will be given to you by the pharmacist with each prescription and refill. Be sure to read this information carefully each time. For Prolia, talk to your pediatrician regarding the use of this medicine in children. Special care may be needed. For Delton See, talk to your pediatrician regarding the use of this medicine in children. While this drug may be prescribed for children as young as 13 years for selected conditions, precautions do apply. Overdosage: If you think you have taken too much of this  medicine contact a poison control center or emergency room at once. NOTE: This medicine is only for you. Do not share this medicine with others. What if I miss a dose? It is important not to miss your dose. Call your doctor or health care professional if you are unable to keep an appointment. What may interact with this medicine? Do not take this medicine with any of the following medications: -other medicines containing denosumab This medicine may also interact with the following medications: -medicines that lower your chance of fighting infection -steroid medicines like prednisone or cortisone This list may not describe all possible interactions. Give your health care provider a list of all the medicines, herbs, non-prescription drugs, or dietary supplements you use. Also tell them if you smoke, drink alcohol, or use illegal drugs. Some items may interact with your medicine. What should I watch for while using this medicine? Visit your doctor or health care professional for regular checks on your progress. Your doctor or health care professional may order blood tests and other tests to see how you are doing. Call your doctor or health care professional for advice if you get a fever, chills or sore throat, or other symptoms of a cold or flu. Do not treat yourself. This drug may decrease your body's ability to fight infection. Try to avoid being around people who are sick. You should make sure you get enough calcium and vitamin D while you are taking this medicine, unless your doctor tells you not to. Discuss the foods you eat and the vitamins you take with your health care professional. See your dentist regularly. Brush  and floss your teeth as directed. Before you have any dental work done, tell your dentist you are receiving this medicine. Do not become pregnant while taking this medicine or for 5 months after stopping it. Talk with your doctor or health care professional about your birth control  options while taking this medicine. Women should inform their doctor if they wish to become pregnant or think they might be pregnant. There is a potential for serious side effects to an unborn child. Talk to your health care professional or pharmacist for more information. What side effects may I notice from receiving this medicine? Side effects that you should report to your doctor or health care professional as soon as possible: -allergic reactions like skin rash, itching or hives, swelling of the face, lips, or tongue -bone pain -breathing problems -dizziness -jaw pain, especially after dental work -redness, blistering, peeling of the skin -signs and symptoms of infection like fever or chills; cough; sore throat; pain or trouble passing urine -signs of low calcium like fast heartbeat, muscle cramps or muscle pain; pain, tingling, numbness in the hands or feet; seizures -unusual bleeding or bruising -unusually weak or tired Side effects that usually do not require medical attention (report to your doctor or health care professional if they continue or are bothersome): -constipation -diarrhea -headache -joint pain -loss of appetite -muscle pain -runny nose -tiredness -upset stomach This list may not describe all possible side effects. Call your doctor for medical advice about side effects. You may report side effects to FDA at 1-800-FDA-1088. Where should I keep my medicine? This medicine is only given in a clinic, doctor's office, or other health care setting and will not be stored at home. NOTE: This sheet is a summary. It may not cover all possible information. If you have questions about this medicine, talk to your doctor, pharmacist, or health care provider.  2018 Elsevier/Gold Standard (2016-04-08 19:17:21)

## 2016-12-29 ENCOUNTER — Encounter: Payer: Self-pay | Admitting: Internal Medicine

## 2016-12-29 ENCOUNTER — Ambulatory Visit (INDEPENDENT_AMBULATORY_CARE_PROVIDER_SITE_OTHER): Payer: Medicare Other | Admitting: Internal Medicine

## 2016-12-29 VITALS — BP 134/86 | HR 58 | Temp 98.2°F | Resp 16 | Wt 177.0 lb

## 2016-12-29 DIAGNOSIS — M810 Age-related osteoporosis without current pathological fracture: Secondary | ICD-10-CM

## 2016-12-29 DIAGNOSIS — E782 Mixed hyperlipidemia: Secondary | ICD-10-CM

## 2016-12-29 DIAGNOSIS — K219 Gastro-esophageal reflux disease without esophagitis: Secondary | ICD-10-CM

## 2016-12-29 DIAGNOSIS — R600 Localized edema: Secondary | ICD-10-CM

## 2016-12-29 DIAGNOSIS — I10 Essential (primary) hypertension: Secondary | ICD-10-CM

## 2016-12-29 NOTE — Assessment & Plan Note (Signed)
GERD controlled Continue prn medication  

## 2016-12-29 NOTE — Assessment & Plan Note (Signed)
BP Readings from Last 3 Encounters:  12/29/16 134/86  12/22/16 126/74  06/25/16 (!) 156/72   BP well controlled Current regimen effective and well tolerated Continue current medications at current doses cmp

## 2016-12-29 NOTE — Assessment & Plan Note (Signed)
Taking calcium and vitamin d No regular exercise, but active at home/walks a lot around her home Fall prevention discussed Discussed medication for OP - she would consider prolia - information given

## 2016-12-29 NOTE — Assessment & Plan Note (Signed)
Lipids well controlled  Continue statin 

## 2016-12-29 NOTE — Assessment & Plan Note (Addendum)
Stopped lasix - was not helping Edema is mild, stable  No medication needed

## 2017-01-05 ENCOUNTER — Other Ambulatory Visit: Payer: Self-pay | Admitting: Internal Medicine

## 2017-01-07 ENCOUNTER — Ambulatory Visit (INDEPENDENT_AMBULATORY_CARE_PROVIDER_SITE_OTHER): Payer: Medicare Other | Admitting: General Practice

## 2017-01-07 DIAGNOSIS — Z86718 Personal history of other venous thrombosis and embolism: Secondary | ICD-10-CM

## 2017-01-07 DIAGNOSIS — Z7901 Long term (current) use of anticoagulants: Secondary | ICD-10-CM

## 2017-01-07 DIAGNOSIS — Z5181 Encounter for therapeutic drug level monitoring: Secondary | ICD-10-CM | POA: Diagnosis not present

## 2017-01-07 LAB — POCT INR: INR: 2.6

## 2017-01-07 NOTE — Patient Instructions (Signed)
Pre visit review using our clinic review tool, if applicable. No additional management support is needed unless otherwise documented below in the visit note. 

## 2017-01-08 NOTE — Progress Notes (Signed)
Agree with management.  Emanuele Mcwhirter J Alawna Graybeal, MD  

## 2017-02-02 ENCOUNTER — Other Ambulatory Visit: Payer: Self-pay | Admitting: Internal Medicine

## 2017-02-04 ENCOUNTER — Ambulatory Visit (INDEPENDENT_AMBULATORY_CARE_PROVIDER_SITE_OTHER): Payer: Medicare Other | Admitting: General Practice

## 2017-02-04 DIAGNOSIS — Z86718 Personal history of other venous thrombosis and embolism: Secondary | ICD-10-CM

## 2017-02-04 DIAGNOSIS — Z7901 Long term (current) use of anticoagulants: Secondary | ICD-10-CM

## 2017-02-04 LAB — POCT INR: INR: 2.1

## 2017-02-04 NOTE — Patient Instructions (Signed)
Pre visit review using our clinic review tool, if applicable. No additional management support is needed unless otherwise documented below in the visit note. 

## 2017-02-04 NOTE — Progress Notes (Signed)
Agree with management.  Stacy J Burns, MD  

## 2017-03-04 ENCOUNTER — Ambulatory Visit (INDEPENDENT_AMBULATORY_CARE_PROVIDER_SITE_OTHER): Payer: Medicare Other | Admitting: General Practice

## 2017-03-04 DIAGNOSIS — Z86718 Personal history of other venous thrombosis and embolism: Secondary | ICD-10-CM | POA: Diagnosis not present

## 2017-03-04 DIAGNOSIS — Z7901 Long term (current) use of anticoagulants: Secondary | ICD-10-CM | POA: Diagnosis not present

## 2017-03-04 LAB — POCT INR: INR: 1.8

## 2017-03-04 NOTE — Patient Instructions (Addendum)
Pre visit review using our clinic review tool, if applicable. No additional management support is needed unless otherwise documented below in the visit note.  Take 1 tablet today and then continue to take 2.5 mg daily except 5 mg on Monday and Friday.  Re-check in 4 weeks.

## 2017-03-06 NOTE — Progress Notes (Signed)
Agree.  Hilman Kissling J Mikeal Winstanley, MD  

## 2017-04-03 ENCOUNTER — Ambulatory Visit (INDEPENDENT_AMBULATORY_CARE_PROVIDER_SITE_OTHER): Payer: Medicare Other | Admitting: General Practice

## 2017-04-03 DIAGNOSIS — Z86718 Personal history of other venous thrombosis and embolism: Secondary | ICD-10-CM | POA: Diagnosis not present

## 2017-04-03 DIAGNOSIS — Z7901 Long term (current) use of anticoagulants: Secondary | ICD-10-CM

## 2017-04-03 LAB — POCT INR: INR: 2.3

## 2017-04-03 NOTE — Progress Notes (Signed)
Agree with management.  Stacy J Burns, MD  

## 2017-04-03 NOTE — Patient Instructions (Addendum)
Pre visit review using our clinic review tool, if applicable. No additional management support is needed unless otherwise documented below in the visit note.  Continue to take 2.5 mg daily except 5 mg on Monday and Friday.  Re-check in 4 weeks.

## 2017-04-07 ENCOUNTER — Ambulatory Visit (INDEPENDENT_AMBULATORY_CARE_PROVIDER_SITE_OTHER): Payer: Medicare Other | Admitting: Internal Medicine

## 2017-04-07 ENCOUNTER — Encounter: Payer: Self-pay | Admitting: Internal Medicine

## 2017-04-07 VITALS — BP 134/82 | HR 61 | Temp 97.8°F | Resp 16 | Wt 176.0 lb

## 2017-04-07 DIAGNOSIS — I10 Essential (primary) hypertension: Secondary | ICD-10-CM

## 2017-04-07 DIAGNOSIS — R42 Dizziness and giddiness: Secondary | ICD-10-CM | POA: Insufficient documentation

## 2017-04-07 MED ORDER — AMLODIPINE BESYLATE 2.5 MG PO TABS: 5.0000 mg | ORAL_TABLET | Freq: Every day | ORAL | 1 refills | Status: DC

## 2017-04-07 NOTE — Assessment & Plan Note (Addendum)
BP ok here today, but has been frequently elevated at home and at other appointments Hypertension not ideally controlled Will try increasing amlodipine to 5 mg daily  Advised to monitor BP Call if she has edema or lightheadedness

## 2017-04-07 NOTE — Patient Instructions (Addendum)
Medications reviewed and updated.  Changes include increasing amlodipine to 5 mg daily   Please followup in April as scheduled.

## 2017-04-07 NOTE — Progress Notes (Signed)
Subjective:    Patient ID: Bonnie Zhang, female    DOB: August 31, 1940, 77 y.o.   MRN: [id number]  HPI The patient is here for an acute visit  Elevated BP:  She is taking her medication daily.  Her BP has been elevated at home -- 150's / 70's.  She denies any chest pain, edema, palpitations and SOB.  She has occasionally headaches, but thinks they may be from changes in her vision and needed an updated prescription.   Vertigo:  She has been having vertigo since before christmas.  It has been intermittent.  She has had this in the past - it was an inner ear problem.  She has nausea with the vertigo.  She has not taken anything for the vertigo.  When it does come it lasts a few minutes.  She does not feel she needs anything for it.    Medications and allergies reviewed with patient and updated if appropriate.  Patient Active Problem List   Diagnosis Date Noted  . Long term (current) use of anticoagulants 01/07/2017  . Carotid artery disease ([place]) 02/01/2016  . Bilateral leg edema 06/21/2015  . Vitamin B12 deficiency (non anemic) 01/24/2015  . 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 Primary localized osteoarthritis of left knee   . Encounter for therapeutic drug monitoring 06/07/2013  . GERD (gastroesophageal reflux disease)   . PVD (peripheral vascular disease) ([place])   . 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 10/09/2010  . Long term current use of anticoagulant 05/01/2010  . Osteoporosis 03/21/2010  . History of colonic polyps 03/20/2008  . Vitamin D deficiency 08/25/2007  . HYPERLIPIDEMIA 03/18/2007  . Dysmetabolic syndrome X 65/05/5463  . Essential hypertension 03/18/2007  . DVT, HX OF 07/23/2006    Current Outpatient Medications on File Prior to Visit  Medication Sig Dispense Refill  . amLODipine (NORVASC) 2.5 MG tablet TAKE 1 TABLET BY MOUTH ONCE DAILY 90 tablet 1  . benazepril (LOTENSIN) 20  MG tablet TAKE 1 TABLET BY MOUTH ONCE DAILY 90 tablet 1  . Cholecalciferol 2000 UNITS CAPS Take 2,000 Units by mouth daily.     . metoprolol succinate (TOPROL-XL) 50 MG 24 hr tablet TAKE 1 TABLET BY MOUTH ONCE DAILY 90 tablet 1  . pantoprazole (PROTONIX) 40 MG tablet Take 1 tablet (40 mg total) by mouth daily as needed. Acid reflux 90 tablet 1  . simvastatin (ZOCOR) 10 MG tablet TAKE 1 TABLET BY MOUTH AT BEDTIME 90 tablet 1  . warfarin (COUMADIN) 5 MG tablet Take as directed by anticoagulation clinic 90 tablet 1   No current facility-administered medications on file prior to visit.     Past Medical History:  Diagnosis Date  . Anticoagulated on Coumadin 2001   2 blood clots  . Bruises easily    pt is on Coumadin;last dose of Coumadin 08/05/11 and then Lovenox started 08/07/11  . DVT (deep vein thrombosis) in pregnancy ([place])     1965 post C section;2001 with prolonged driving while on  HRT  . DVT (deep venous thrombosis) ([place]) 2001   Was on Prempro.    Marland Kitchen GERD (gastroesophageal reflux disease)    Protonix prn  . High cholesterol    takes Simvastating daily  . History of colon polyps 2007   Dr Sharlett Iles  . 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 Hx of colonic polyps    Dr Sharlett Iles  . Hx of migraines    yrs ago   .  Hypertension    takes Benazepril,Amlodipine,and Metoprolol daily  . Osteoporosis   . PONV (postoperative nausea and vomiting)   . Primary localized osteoarthritis of left knee   . PVD (peripheral vascular disease) ([place]) 1965, 2001   abnormal venous doppler findings due to recurrent DVT'S left leg  . Right knee DJD    knees  . Shortness of breath dyspnea   . Vitamin B12 deficiency (non anemic) 01/24/2015  . Vitamin D deficiency    takes VIt D daily    Past Surgical History:  Procedure Laterality Date  . ABDOMINAL HYSTERECTOMY    . APPENDECTOMY  1965  . BREAST LUMPECTOMY  1987  . CESAREAN SECTION  1961/1965/1966   X 3  . CHOLECYSTECTOMY  1997  . COLONOSCOPY W/  POLYPECTOMY  2007   Dr  Sharlett Iles; due? 2012  . DILATION AND CURETTAGE OF UTERUS  2012   uterine polyp, Dr Kennon Rounds  . ESOPHAGOGASTRODUODENOSCOPY  1997  . HYSTEROSCOPY W/D&C N/A 08/05/2012   Procedure: DILATATION AND CURETTAGE /HYSTEROSCOPY;  Surgeon: Donnamae Jude, MD;  Location: [facility];  Service: Gynecology;  Laterality: N/A;  . ROBOTIC ASSISTED TOTAL HYSTERECTOMY WITH BILATERAL SALPINGO OOPHERECTOMY Right 09/14/2012   Procedure: ROBOTIC ASSISTED TOTAL HYSTERECTOMY WITH BILATERAL SALPINGO OOPHORECTOMY ,right pelvic LYMPHNODE dissection;  Surgeon: Imagene Gurney A. Alycia Rossetti, MD;  Location: [facility];  Service: Gynecology;  Laterality: Right;  . TOTAL KNEE ARTHROPLASTY  08/11/2011   Procedure: TOTAL KNEE ARTHROPLASTY;  Surgeon: Lorn Junes, MD;  Location: [facility];  Service: Orthopedics;  Laterality: Right;  DR [place] THIS CASE  . TOTAL KNEE ARTHROPLASTY Left 09/11/2014  . TOTAL KNEE ARTHROPLASTY Left 09/11/2014   Procedure: TOTAL KNEE ARTHROPLASTY;  Surgeon: Elsie Saas, MD;  Location: [facility];  Service: Orthopedics;  Laterality: Left;  . WISDOM TOOTH EXTRACTION      Social History   Socioeconomic History  . Marital status: Divorced    Spouse name: Not on file  . Number of children: Not on file  . Years of education: Not on file  . Highest education level: Not on file  Social Needs  . Financial resource strain: Not on file  . Food insecurity - worry: Not on file  . Food insecurity - inability: Not on file  . Transportation needs - medical: Not on file  . Transportation needs - non-medical: Not on file  Occupational History  . Not on file  Tobacco Use  . Smoking status: Never Smoker  . Smokeless tobacco: Never Used  Substance and Sexual Activity  . Alcohol use: No  . Drug use: No  . Sexual activity: No    Birth control/protection: Post-menopausal    Comment: retired. has 2 daughters  Other Topics Concern  . Not on file  Social History Narrative   No regular exercise     Family History  Problem Relation Age of Onset  . Kidney disease Mother        ? hypertensive  . Hypertension Mother   . Deep vein thrombosis Mother        post ankle fracture  . Leukemia Father   . Colon cancer Maternal Grandmother   . Diabetes Other        cousins  . Anesthesia problems Neg Hx   . Hypotension Neg Hx   . Malignant hyperthermia Neg Hx   . Pseudochol deficiency Neg Hx   . Stroke Neg Hx   . Heart disease Neg Hx     Review of Systems  Constitutional: Negative for chills and fever.  Respiratory: Negative for cough, shortness of breath and wheezing.   Cardiovascular: Negative for chest pain, palpitations and leg swelling.  Neurological: Positive for dizziness and headaches (related to vision).       Objective:   Vitals:   04/07/17 1458  BP: 134/82  Pulse: 61  Resp: 16  Temp: 97.8 F (36.6 C)  SpO2: 98%   Wt Readings from Last 3 Encounters:  04/07/17 176 lb (79.8 kg)  12/29/16 177 lb (80.3 kg)  12/22/16 175 lb (79.4 kg)   Body mass index is 34.37 kg/m.   Physical Exam    Constitutional: Appears well-developed and well-nourished. No distress.  HENT:  Head: Normocephalic and atraumatic.  Neck: Neck supple. No tracheal deviation present. No thyromegaly present.  No cervical lymphadenopathy Cardiovascular: Normal rate, regular rhythm and normal heart sounds.   No murmur heard. No carotid bruit .  No edema Pulmonary/Chest: Effort normal and breath sounds normal. No respiratory distress. No has no wheezes. No rales.  Skin: Skin is warm and dry. Not diaphoretic.  Psychiatric: Normal mood and affect. Behavior is normal.      Assessment & Plan:    See Problem List for Assessment and Plan of chronic medical problems.

## 2017-04-07 NOTE — Assessment & Plan Note (Signed)
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

## 2017-04-21 ENCOUNTER — Other Ambulatory Visit: Payer: Self-pay | Admitting: Internal Medicine

## 2017-04-24 ENCOUNTER — Other Ambulatory Visit: Payer: Self-pay | Admitting: Internal Medicine

## 2017-05-01 DIAGNOSIS — H5203 Hypermetropia, bilateral: Secondary | ICD-10-CM | POA: Diagnosis not present

## 2017-05-01 DIAGNOSIS — H04123 Dry eye syndrome of bilateral lacrimal glands: Secondary | ICD-10-CM | POA: Diagnosis not present

## 2017-05-01 DIAGNOSIS — H25813 Combined forms of age-related cataract, bilateral: Secondary | ICD-10-CM | POA: Diagnosis not present

## 2017-05-05 ENCOUNTER — Ambulatory Visit (INDEPENDENT_AMBULATORY_CARE_PROVIDER_SITE_OTHER): Payer: Medicare Other | Admitting: General Practice

## 2017-05-05 DIAGNOSIS — Z7901 Long term (current) use of anticoagulants: Secondary | ICD-10-CM

## 2017-05-05 DIAGNOSIS — Z86718 Personal history of other venous thrombosis and embolism: Secondary | ICD-10-CM

## 2017-05-05 LAB — POCT INR: INR: 3

## 2017-05-05 NOTE — Patient Instructions (Addendum)
Pre visit review using our clinic review tool, if applicable. No additional management support is needed unless otherwise documented below in the visit note.  Continue to take 2.5 mg daily except 5 mg on Monday and Friday.  Re-check in 4 weeks.

## 2017-05-05 NOTE — Progress Notes (Signed)
Agree with management.  Lexys Milliner J Jaliah Foody, MD  

## 2017-05-24 ENCOUNTER — Other Ambulatory Visit: Payer: Self-pay | Admitting: Internal Medicine

## 2017-06-02 ENCOUNTER — Ambulatory Visit (INDEPENDENT_AMBULATORY_CARE_PROVIDER_SITE_OTHER): Payer: Medicare Other | Admitting: General Practice

## 2017-06-02 DIAGNOSIS — Z86718 Personal history of other venous thrombosis and embolism: Secondary | ICD-10-CM | POA: Diagnosis not present

## 2017-06-02 DIAGNOSIS — Z7901 Long term (current) use of anticoagulants: Secondary | ICD-10-CM

## 2017-06-02 LAB — POCT INR: INR: 1.7

## 2017-06-02 NOTE — Progress Notes (Signed)
Agree with management.  Uzma Hellmer J Lamark Schue, MD  

## 2017-06-02 NOTE — Patient Instructions (Signed)
Pre visit review using our clinic review tool, if applicable. No additional management support is needed unless otherwise documented below in the visit note.  Take 5 mg today (3/5) and then continue to take 2.5 mg daily except 5 mg on Monday and Friday.  Re-check in 4 weeks.

## 2017-06-26 NOTE — Progress Notes (Signed)
Subjective:    Patient ID: Bonnie Zhang, female    DOB: [birth year], 77 y.o.   MRN: [id number]  HPI She is here for a physical exam.     Medications and allergies reviewed with patient and updated if appropriate.  Patient Active Problem List   Diagnosis Date Noted  . Vertigo 04/07/2017  . Long term (current) use of anticoagulants 01/07/2017  . Carotid artery disease ([place]) 02/01/2016  . Bilateral leg edema 06/21/2015  . Vitamin B12 deficiency (non anemic) 01/24/2015  . 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 Primary localized osteoarthritis of left knee   . Encounter for therapeutic drug monitoring 06/07/2013  . GERD (gastroesophageal reflux disease)   . PVD (peripheral vascular disease) ([place])   . 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 10/09/2010  . Long term current use of anticoagulant 05/01/2010  . Osteoporosis 03/21/2010  . History of colonic polyps 03/20/2008  . Vitamin D deficiency 08/25/2007  . HYPERLIPIDEMIA 03/18/2007  . Dysmetabolic syndrome X 17/51/0258  . Essential hypertension 03/18/2007  . DVT, HX OF 07/23/2006    Current Outpatient Medications on File Prior to Visit  Medication Sig Dispense Refill  . amLODipine (NORVASC) 2.5 MG tablet Take 2 tablets (5 mg total) by mouth daily. 180 tablet 1  . benazepril (LOTENSIN) 20 MG tablet TAKE 1 TABLET BY MOUTH ONCE DAILY 90 tablet 1  . Cholecalciferol 2000 UNITS CAPS Take 2,000 Units by mouth daily.     . metoprolol succinate (TOPROL-XL) 50 MG 24 hr tablet TAKE 1 TABLET BY MOUTH ONCE DAILY 90 tablet 1  . pantoprazole (PROTONIX) 40 MG tablet TAKE ONE TABLET BY MOUTH ONCE DAILY AS NEEDED FOR  ACID  REFLUX 90 tablet 1  . simvastatin (ZOCOR) 10 MG tablet TAKE 1 TABLET BY MOUTH AT BEDTIME 90 tablet 1  . warfarin (COUMADIN) 5 MG tablet TAKE AS DIRECTED BY ANTICOAGULATION CLINIC 90 tablet 1   No current facility-administered medications on  file prior to visit.     Past Medical History:  Diagnosis Date  . Anticoagulated on Coumadin 2001   2 blood clots  . Bruises easily    pt is on Coumadin;last dose of Coumadin 08/05/11 and then Lovenox started 08/07/11  . DVT (deep vein thrombosis) in pregnancy ([place])     1965 post C section;2001 with prolonged driving while on  HRT  . DVT (deep venous thrombosis) ([place]) 2001   Was on Prempro.    Marland Kitchen GERD (gastroesophageal reflux disease)    Protonix prn  . High cholesterol    takes Simvastating daily  . History of colon polyps 2007   Dr Sharlett Iles  . 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 Hx of colonic polyps    Dr Sharlett Iles  . Hx of migraines    yrs ago   . Hypertension    takes Benazepril,Amlodipine,and Metoprolol daily  . Osteoporosis   . PONV (postoperative nausea and vomiting)   . Primary localized osteoarthritis of left knee   . PVD (peripheral vascular disease) ([place]) 1965, 2001   abnormal venous doppler findings due to recurrent DVT'S left leg  . Right knee DJD    knees  . Shortness of breath dyspnea   . Vitamin B12 deficiency (non anemic) 01/24/2015  . Vitamin D deficiency    takes VIt D daily    Past Surgical History:  Procedure Laterality Date  . ABDOMINAL HYSTERECTOMY    . APPENDECTOMY  1965  . BREAST  LUMPECTOMY  1987  . CESAREAN SECTION  1961/1965/1966   X 3  . CHOLECYSTECTOMY  1997  . COLONOSCOPY W/ POLYPECTOMY  2007   Dr  Sharlett Iles; due? 2012  . DILATION AND CURETTAGE OF UTERUS  2012   uterine polyp, Dr Kennon Rounds  . ESOPHAGOGASTRODUODENOSCOPY  1997  . HYSTEROSCOPY W/D&C N/A 08/05/2012   Procedure: DILATATION AND CURETTAGE /HYSTEROSCOPY;  Surgeon: Donnamae Jude, MD;  Location: [facility];  Service: Gynecology;  Laterality: N/A;  . ROBOTIC ASSISTED TOTAL HYSTERECTOMY WITH BILATERAL SALPINGO OOPHERECTOMY Right 09/14/2012   Procedure: ROBOTIC ASSISTED TOTAL HYSTERECTOMY WITH BILATERAL SALPINGO OOPHORECTOMY ,right pelvic LYMPHNODE dissection;  Surgeon: Imagene Gurney A. Alycia Rossetti, MD;   Location: [facility];  Service: Gynecology;  Laterality: Right;  . TOTAL KNEE ARTHROPLASTY  08/11/2011   Procedure: TOTAL KNEE ARTHROPLASTY;  Surgeon: Lorn Junes, MD;  Location: [facility];  Service: Orthopedics;  Laterality: Right;  DR [place] THIS CASE  . TOTAL KNEE ARTHROPLASTY Left 09/11/2014  . TOTAL KNEE ARTHROPLASTY Left 09/11/2014   Procedure: TOTAL KNEE ARTHROPLASTY;  Surgeon: Elsie Saas, MD;  Location: [facility];  Service: Orthopedics;  Laterality: Left;  . WISDOM TOOTH EXTRACTION      Social History   Socioeconomic History  . Marital status: Divorced    Spouse name: Not on file  . Number of children: Not on file  . Years of education: Not on file  . Highest education level: Not on file  Occupational History  . Not on file  Social Needs  . Financial resource strain: Not on file  . Food insecurity:    Worry: Not on file    Inability: Not on file  . Transportation needs:    Medical: Not on file    Non-medical: Not on file  Tobacco Use  . Smoking status: Never Smoker  . Smokeless tobacco: Never Used  Substance and Sexual Activity  . Alcohol use: No  . Drug use: No  . Sexual activity: Never    Birth control/protection: Post-menopausal    Comment: retired. has 2 daughters  Lifestyle  . Physical activity:    Days per week: Not on file    Minutes per session: Not on file  . Stress: Not on file  Relationships  . Social connections:    Talks on phone: Not on file    Gets together: Not on file    Attends religious service: Not on file    Active member of club or organization: Not on file    Attends meetings of clubs or organizations: Not on file    Relationship status: Not on file  Other Topics Concern  . Not on file  Social History Narrative   No regular exercise    Family History  Problem Relation Age of Onset  . Kidney disease Mother        ? hypertensive  . Hypertension Mother   . Deep vein thrombosis Mother        post ankle fracture  .  Leukemia Father   . Colon cancer Maternal Grandmother   . Diabetes Other        cousins  . Anesthesia problems Neg Hx   . Hypotension Neg Hx   . Malignant hyperthermia Neg Hx   . Pseudochol deficiency Neg Hx   . Stroke Neg Hx   . Heart disease Neg Hx     Review of Systems  Constitutional: Negative for chills and fever.  Eyes: Negative for visual disturbance.  Respiratory: Negative  for cough, shortness of breath and wheezing.   Cardiovascular: Negative for chest pain, palpitations and leg swelling.  Gastrointestinal: Positive for constipation (intermittent, mild). Negative for abdominal pain, blood in stool, diarrhea and nausea.       GERD occ - takes medication prn  Genitourinary: Negative for dysuria and hematuria.  Musculoskeletal: Positive for arthralgias (knees,). Negative for back pain.  Skin: Negative for color change and rash.  Neurological: Negative for dizziness, light-headedness and headaches.  Psychiatric/Behavioral: Negative for dysphoric mood. The patient is not nervous/anxious.        Objective:   Vitals:   06/29/17 0936  BP: 126/66  Pulse: 68  Resp: 16  Temp: 97.9 F (36.6 C)  SpO2: 97%   Filed Weights   06/29/17 0936  Weight: 181 lb (82.1 kg)   Body mass index is 35.35 kg/m.  Wt Readings from Last 3 Encounters:  06/29/17 181 lb (82.1 kg)  04/07/17 176 lb (79.8 kg)  12/29/16 177 lb (80.3 kg)     Physical Exam Constitutional: She appears well-developed and well-nourished. No distress.  HENT:  Head: Normocephalic and atraumatic.  Right Ear: External ear normal. Normal ear canal and TM Left Ear: External ear normal.  Normal ear canal and TM Mouth/Throat: Oropharynx is clear and moist.  Eyes: Conjunctivae and EOM are normal.  Neck: Neck supple. No tracheal deviation present. No thyromegaly present.  No carotid bruit  Cardiovascular: Normal rate, regular rhythm and normal heart sounds.   No murmur heard.  Mild b/l LE edema L >  R Pulmonary/Chest: Effort normal and breath sounds normal. No respiratory distress. She has no wheezes. She has no rales.  Breast: deferred  Abdominal: Soft. She exhibits no distension. There is no tenderness.  Lymphadenopathy: She has no cervical adenopathy.  Skin: Skin is warm and dry. She is not diaphoretic.  Psychiatric: She has a normal mood and affect. Her behavior is normal.        Assessment & Plan:   Physical exam: Screening blood work  ordered Immunizations   Dicussed shingrix, others up to date Colonoscopy  Due in 2019 - discussed colonoscopy vs cologuard  Mammogram  Last done 01/2016 Dexa  Up to date  Eye exams  Up to date  EKG      Done 11/2014 Exercise  Doing some walking to daughter's house - next dooe Weight  Advised weight loss Skin   No concerns Substance abuse  none  See Problem List for Assessment and Plan of chronic medical problems.   FU in 6 months

## 2017-06-26 NOTE — Patient Instructions (Addendum)
Test(s) ordered today. Your results will be released to [facility] (or called to you) after review, usually within 72hours after test completion. If any changes need to be made, you will be notified at that same time.  All other Health Maintenance issues reviewed.   All recommended immunizations and age-appropriate screenings are up-to-date or discussed.  No immunizations administered today.  Think about the shingles vaccine.    Think about having a colonoscopy or cologuard.    Medications reviewed and updated.  No changes recommended at this time.  Please followup in 6 months   Health Maintenance, Female Adopting a healthy lifestyle and getting preventive care can go a long way to promote health and wellness. Talk with your health care provider about what schedule of regular examinations is right for you. This is a good chance for you to check in with your provider about disease prevention and staying healthy. In between checkups, there are plenty of things you can do on your own. Experts have done a lot of research about which lifestyle changes and preventive measures are most likely to keep you healthy. Ask your health care provider for more information. Weight and diet Eat a healthy diet  Be sure to include plenty of vegetables, fruits, low-fat dairy products, and lean protein.  Do not eat a lot of foods high in solid fats, added sugars, or salt.  Get regular exercise. This is one of the most important things you can do for your health. ? Most adults should exercise for at least 150 minutes each week. The exercise should increase your heart rate and make you sweat (moderate-intensity exercise). ? Most adults should also do strengthening exercises at least twice a week. This is in addition to the moderate-intensity exercise.  Maintain a healthy weight  Body mass index (BMI) is a measurement that can be used to identify possible weight problems. It estimates body fat based on height and  weight. Your health care provider can help determine your BMI and help you achieve or maintain a healthy weight.  For females 80 years of age and older: ? A BMI below 18.5 is considered underweight. ? A BMI of 18.5 to 24.9 is normal. ? A BMI of 25 to 29.9 is considered overweight. ? A BMI of 30 and above is considered obese.  Watch levels of cholesterol and blood lipids  You should start having your blood tested for lipids and cholesterol at 76 years of age, then have this test every 5 years.  You may need to have your cholesterol levels checked more often if: ? Your lipid or cholesterol levels are high. ? You are older than 76 years of age. ? You are at high risk for heart disease.  Cancer screening Lung Cancer  Lung cancer screening is recommended for adults 72-53 years old who are at high risk for lung cancer because of a history of smoking.  A yearly low-dose CT scan of the lungs is recommended for people who: ? Currently smoke. ? Have quit within the past 15 years. ? Have at least a 30-pack-year history of smoking. A pack year is smoking an average of one pack of cigarettes a day for 1 year.  Yearly screening should continue until it has been 15 years since you quit.  Yearly screening should stop if you develop a health problem that would prevent you from having lung cancer treatment.  Breast Cancer  Practice breast self-awareness. This means understanding how your breasts normally appear and feel.  It also means doing regular breast self-exams. Let your health care provider know about any changes, no matter how small.  If you are in your 20s or 30s, you should have a clinical breast exam (CBE) by a health care provider every 1-3 years as part of a regular health exam.  If you are 3 or older, have a CBE every year. Also consider having a breast X-ray (mammogram) every year.  If you have a family history of breast cancer, talk to your health care provider about genetic  screening.  If you are at high risk for breast cancer, talk to your health care provider about having an MRI and a mammogram every year.  Breast cancer gene (BRCA) assessment is recommended for women who have family members with BRCA-related cancers. BRCA-related cancers include: ? Breast. ? Ovarian. ? Tubal. ? Peritoneal cancers.  Results of the assessment will determine the need for genetic counseling and BRCA1 and BRCA2 testing.  Cervical Cancer Your health care provider may recommend that you be screened regularly for cancer of the pelvic organs (ovaries, uterus, and vagina). This screening involves a pelvic examination, including checking for microscopic changes to the surface of your cervix (Pap test). You may be encouraged to have this screening done every 3 years, beginning at age 94.  For women ages 10-65, health care providers may recommend pelvic exams and Pap testing every 3 years, or they may recommend the Pap and pelvic exam, combined with testing for human papilloma virus (HPV), every 5 years. Some types of HPV increase your risk of cervical cancer. Testing for HPV may also be done on women of any age with unclear Pap test results.  Other health care providers may not recommend any screening for nonpregnant women who are considered low risk for pelvic cancer and who do not have symptoms. Ask your health care provider if a screening pelvic exam is right for you.  If you have had past treatment for cervical cancer or a condition that could lead to cancer, you need Pap tests and screening for cancer for at least 20 years after your treatment. If Pap tests have been discontinued, your risk factors (such as having a new sexual partner) need to be reassessed to determine if screening should resume. Some women have medical problems that increase the chance of getting cervical cancer. In these cases, your health care provider may recommend more frequent screening and Pap  tests.  Colorectal Cancer  This type of cancer can be detected and often prevented.  Routine colorectal cancer screening usually begins at 76 years of age and continues through 76 years of age.  Your health care provider may recommend screening at an earlier age if you have risk factors for colon cancer.  Your health care provider may also recommend using home test kits to check for hidden blood in the stool.  A small camera at the end of a tube can be used to examine your colon directly (sigmoidoscopy or colonoscopy). This is done to check for the earliest forms of colorectal cancer.  Routine screening usually begins at age 30.  Direct examination of the colon should be repeated every 5-10 years through 76 years of age. However, you may need to be screened more often if early forms of precancerous polyps or small growths are found.  Skin Cancer  Check your skin from head to toe regularly.  Tell your health care provider about any new moles or changes in moles, especially if there is a  change in a mole's shape or color.  Also tell your health care provider if you have a mole that is larger than the size of a pencil eraser.  Always use sunscreen. Apply sunscreen liberally and repeatedly throughout the day.  Protect yourself by wearing long sleeves, pants, a wide-brimmed hat, and sunglasses whenever you are outside.  Heart disease, diabetes, and high blood pressure  High blood pressure causes heart disease and increases the risk of stroke. High blood pressure is more likely to develop in: ? People who have blood pressure in the high end of the normal range (130-139/85-89 mm Hg). ? People who are overweight or obese. ? People who are African American.  If you are 41-92 years of age, have your blood pressure checked every 3-5 years. If you are 1 years of age or older, have your blood pressure checked every year. You should have your blood pressure measured twice-once when you are at  a hospital or clinic, and once when you are not at a hospital or clinic. Record the average of the two measurements. To check your blood pressure when you are not at a hospital or clinic, you can use: ? An automated blood pressure machine at a pharmacy. ? A home blood pressure monitor.  If you are between 70 years and 84 years old, ask your health care provider if you should take aspirin to prevent strokes.  Have regular diabetes screenings. This involves taking a blood sample to check your fasting blood sugar level. ? If you are at a normal weight and have a low risk for diabetes, have this test once every three years after 76 years of age. ? If you are overweight and have a high risk for diabetes, consider being tested at a younger age or more often. Preventing infection Hepatitis B  If you have a higher risk for hepatitis B, you should be screened for this virus. You are considered at high risk for hepatitis B if: ? You were born in a country where hepatitis B is common. Ask your health care provider which countries are considered high risk. ? Your parents were born in a high-risk country, and you have not been immunized against hepatitis B (hepatitis B vaccine). ? You have HIV or AIDS. ? You use needles to inject street drugs. ? You live with someone who has hepatitis B. ? You have had sex with someone who has hepatitis B. ? You get hemodialysis treatment. ? You take certain medicines for conditions, including cancer, organ transplantation, and autoimmune conditions.  Hepatitis C  Blood testing is recommended for: ? Everyone born from [birth year] through 1964. ? Anyone with known risk factors for hepatitis C.  Sexually transmitted infections (STIs)  You should be screened for sexually transmitted infections (STIs) including gonorrhea and chlamydia if: ? You are sexually active and are younger than 76 years of age. ? You are older than 76 years of age and your health care provider tells  you that you are at risk for this type of infection. ? Your sexual activity has changed since you were last screened and you are at an increased risk for chlamydia or gonorrhea. Ask your health care provider if you are at risk.  If you do not have HIV, but are at risk, it may be recommended that you take a prescription medicine daily to prevent HIV infection. This is called pre-exposure prophylaxis (PrEP). You are considered at risk if: ? You are sexually active and do not regularly  use condoms or know the HIV status of your partner(s). ? You take drugs by injection. ? You are sexually active with a partner who has HIV.  Talk with your health care provider about whether you are at high risk of being infected with HIV. If you choose to begin PrEP, you should first be tested for HIV. You should then be tested every 3 months for as long as you are taking PrEP. Pregnancy  If you are premenopausal and you may become pregnant, ask your health care provider about preconception counseling.  If you may become pregnant, take 400 to 800 micrograms (mcg) of folic acid every day.  If you want to prevent pregnancy, talk to your health care provider about birth control (contraception). Osteoporosis and menopause  Osteoporosis is a disease in which the bones lose minerals and strength with aging. This can result in serious bone fractures. Your risk for osteoporosis can be identified using a bone density scan.  If you are 65 years of age or older, or if you are at risk for osteoporosis and fractures, ask your health care provider if you should be screened.  Ask your health care provider whether you should take a calcium or vitamin D supplement to lower your risk for osteoporosis.  Menopause may have certain physical symptoms and risks.  Hormone replacement therapy may reduce some of these symptoms and risks. Talk to your health care provider about whether hormone replacement therapy is right for  you. Follow these instructions at home:  Schedule regular health, dental, and eye exams.  Stay current with your immunizations.  Do not use any tobacco products including cigarettes, chewing tobacco, or electronic cigarettes.  If you are pregnant, do not drink alcohol.  If you are breastfeeding, limit how much and how often you drink alcohol.  Limit alcohol intake to no more than 1 drink per day for nonpregnant women. One drink equals 12 ounces of beer, 5 ounces of wine, or 1 ounces of hard liquor.  Do not use street drugs.  Do not share needles.  Ask your health care provider for help if you need support or information about quitting drugs.  Tell your health care provider if you often feel depressed.  Tell your health care provider if you have ever been abused or do not feel safe at home. This information is not intended to replace advice given to you by your health care provider. Make sure you discuss any questions you have with your health care provider. Document Released: 09/30/2010 Document Revised: 08/23/2015 Document Reviewed: 12/19/2014 Elsevier Interactive Patient Education  2018 [employer]  

## 2017-06-29 ENCOUNTER — Encounter: Payer: Self-pay | Admitting: Internal Medicine

## 2017-06-29 ENCOUNTER — Ambulatory Visit (INDEPENDENT_AMBULATORY_CARE_PROVIDER_SITE_OTHER): Payer: Medicare Other | Admitting: Internal Medicine

## 2017-06-29 ENCOUNTER — Other Ambulatory Visit: Payer: Self-pay | Admitting: Internal Medicine

## 2017-06-29 ENCOUNTER — Other Ambulatory Visit (INDEPENDENT_AMBULATORY_CARE_PROVIDER_SITE_OTHER): Payer: Medicare Other

## 2017-06-29 VITALS — BP 126/66 | HR 68 | Temp 97.9°F | Resp 16 | Ht 60.0 in | Wt 181.0 lb

## 2017-06-29 DIAGNOSIS — K219 Gastro-esophageal reflux disease without esophagitis: Secondary | ICD-10-CM | POA: Diagnosis not present

## 2017-06-29 DIAGNOSIS — R739 Hyperglycemia, unspecified: Secondary | ICD-10-CM

## 2017-06-29 DIAGNOSIS — Z0000 Encounter for general adult medical examination without abnormal findings: Secondary | ICD-10-CM | POA: Diagnosis not present

## 2017-06-29 DIAGNOSIS — E782 Mixed hyperlipidemia: Secondary | ICD-10-CM

## 2017-06-29 DIAGNOSIS — I10 Essential (primary) hypertension: Secondary | ICD-10-CM

## 2017-06-29 DIAGNOSIS — R600 Localized edema: Secondary | ICD-10-CM

## 2017-06-29 LAB — TSH: TSH: 1.86 u[IU]/mL (ref 0.35–4.50)

## 2017-06-29 LAB — COMPREHENSIVE METABOLIC PANEL
ALT: 10 U/L (ref 0–35)
AST: 15 U/L (ref 0–37)
Albumin: 4.1 g/dL (ref 3.5–5.2)
Alkaline Phosphatase: 86 U/L (ref 39–117)
BUN: 8 mg/dL (ref 6–23)
CO2: 29 mEq/L (ref 19–32)
Calcium: 9.6 mg/dL (ref 8.4–10.5)
Chloride: 104 mEq/L (ref 96–112)
Creatinine, Ser: 0.78 mg/dL (ref 0.40–1.20)
GFR: 76.35 mL/min (ref >60.00)
Glucose, Bld: 115 mg/dL — ABNORMAL HIGH (ref 70–99)
Potassium: 4.1 mEq/L (ref 3.5–5.1)
Sodium: 141 mEq/L (ref 135–145)
Total Bilirubin: 0.7 mg/dL (ref 0.2–1.2)
Total Protein: 7.9 g/dL (ref 6.0–8.3)

## 2017-06-29 LAB — CBC WITH DIFFERENTIAL/PLATELET
Basophils Absolute: 0 10*3/uL (ref 0.0–0.1)
Basophils Relative: 0.5 % (ref 0.0–3.0)
Eosinophils Absolute: 0.1 10*3/uL (ref 0.0–0.7)
Eosinophils Relative: 2.4 % (ref 0.0–5.0)
HCT: 42.9 % (ref 36.0–46.0)
Hemoglobin: 14.6 g/dL (ref 12.0–15.0)
Lymphocytes Relative: 35.8 % (ref 12.0–46.0)
Lymphs Abs: 2.1 10*3/uL (ref 0.7–4.0)
MCHC: 33.9 g/dL (ref 30.0–36.0)
MCV: 87.7 fl (ref 78.0–100.0)
Monocytes Absolute: 0.4 10*3/uL (ref 0.1–1.0)
Monocytes Relative: 6.9 % (ref 3.0–12.0)
Neutro Abs: 3.1 10*3/uL (ref 1.4–7.7)
Neutrophils Relative %: 54.4 % (ref 43.0–77.0)
Platelets: 197 10*3/uL (ref 150.0–400.0)
RBC: 4.89 Mil/uL (ref 3.87–5.11)
RDW: 13.2 % (ref 11.5–15.5)
WBC: 5.7 10*3/uL (ref 4.0–10.5)

## 2017-06-29 LAB — LIPID PANEL
Cholesterol: 172 mg/dL (ref 0–200)
HDL: 69.1 mg/dL (ref >39.00)
LDL Cholesterol: 80 mg/dL (ref 0–99)
NonHDL: 102.5
Total CHOL/HDL Ratio: 2
Triglycerides: 115 mg/dL (ref 0.0–149.0)
VLDL: 23 mg/dL (ref 0.0–40.0)

## 2017-06-29 LAB — HEMOGLOBIN A1C: Hgb A1c MFr Bld: 5.4 % (ref 4.6–6.5)

## 2017-06-29 NOTE — Assessment & Plan Note (Signed)
Take protonix prn only GERD occasionally continue

## 2017-06-29 NOTE — Assessment & Plan Note (Signed)
a1c

## 2017-06-29 NOTE — Assessment & Plan Note (Signed)
BP well controlled Current regimen effective and well tolerated Continue current medications at current doses Cmp, tsh 

## 2017-06-29 NOTE — Assessment & Plan Note (Signed)
Check lipid panel,cmp ,tsh Continue daily statin Regular exercise and healthy diet encouraged  

## 2017-06-29 NOTE — Assessment & Plan Note (Signed)
No longer on lasix - did not help Elevate legs when sitting  Low sodium Increase walking

## 2017-06-30 ENCOUNTER — Ambulatory Visit (INDEPENDENT_AMBULATORY_CARE_PROVIDER_SITE_OTHER): Payer: Medicare Other | Admitting: General Practice

## 2017-06-30 DIAGNOSIS — Z7901 Long term (current) use of anticoagulants: Secondary | ICD-10-CM

## 2017-06-30 DIAGNOSIS — Z86718 Personal history of other venous thrombosis and embolism: Secondary | ICD-10-CM

## 2017-06-30 LAB — POCT INR: INR: 1.6

## 2017-06-30 NOTE — Progress Notes (Signed)
Agree with management.  Shamir Tuzzolino J Amauri Medellin, MD  

## 2017-06-30 NOTE — Patient Instructions (Addendum)
Pre visit review using our clinic review tool, if applicable. No additional management support is needed unless otherwise documented below in the visit note.  Take 5 mg today (4/2) and then change dosage and take 2.5 mg daily except 5 mg on Monday/Wednesday and Fridays.  Re-check in 4 weeks.

## 2017-07-21 ENCOUNTER — Other Ambulatory Visit: Payer: Self-pay | Admitting: Internal Medicine

## 2017-07-28 ENCOUNTER — Ambulatory Visit (INDEPENDENT_AMBULATORY_CARE_PROVIDER_SITE_OTHER): Payer: Medicare Other | Admitting: General Practice

## 2017-07-28 ENCOUNTER — Ambulatory Visit: Payer: Medicare Other

## 2017-07-28 DIAGNOSIS — Z86718 Personal history of other venous thrombosis and embolism: Secondary | ICD-10-CM | POA: Diagnosis not present

## 2017-07-28 DIAGNOSIS — Z7901 Long term (current) use of anticoagulants: Secondary | ICD-10-CM | POA: Diagnosis not present

## 2017-07-28 LAB — POCT INR: INR: 2.2

## 2017-07-28 NOTE — Patient Instructions (Addendum)
Pre visit review using our clinic review tool, if applicable. No additional management support is needed unless otherwise documented below in the visit note.  Continue to take 2.5 mg daily except 5 mg on Monday/Wednesday and Fridays.  Re-check in 4 weeks.  

## 2017-08-03 ENCOUNTER — Other Ambulatory Visit: Payer: Self-pay | Admitting: Internal Medicine

## 2017-08-17 ENCOUNTER — Telehealth: Payer: Self-pay | Admitting: Internal Medicine

## 2017-08-17 MED ORDER — AMLODIPINE BESYLATE 5 MG PO TABS: 5.0000 mg | ORAL_TABLET | Freq: Every day | ORAL | 1 refills | Status: DC

## 2017-08-17 NOTE — Telephone Encounter (Signed)
Copied from [place] [id number]. Topic: Inquiry >> Aug 17, 2017 10:37 AM Pricilla Handler wrote: Reason for CRM: Patient called requesting a refill of AmLODipine (NORVASC) 5 MG tablet.   Patient's preferred phamacy is [facility] ([place]), [place] [phone] (Phone) [phone] (Fax).

## 2017-08-17 NOTE — Telephone Encounter (Signed)
Reviewed chart pt is up-to-date sent refills to [facility],,/[facility]

## 2017-08-17 NOTE — Addendum Note (Signed)
Addended by: Earnstine Regal on: 08/17/2017 11:07 AM   Modules accepted: Orders

## 2017-08-25 ENCOUNTER — Ambulatory Visit (INDEPENDENT_AMBULATORY_CARE_PROVIDER_SITE_OTHER): Payer: Medicare Other | Admitting: General Practice

## 2017-08-25 DIAGNOSIS — Z7901 Long term (current) use of anticoagulants: Secondary | ICD-10-CM | POA: Diagnosis not present

## 2017-08-25 DIAGNOSIS — Z86718 Personal history of other venous thrombosis and embolism: Secondary | ICD-10-CM

## 2017-08-25 LAB — POCT INR: INR: 2.5 (ref 2.0–3.0)

## 2017-08-25 NOTE — Patient Instructions (Addendum)
Pre visit review using our clinic review tool, if applicable. No additional management support is needed unless otherwise documented below in the visit note.  Continue to take 2.5 mg daily except 5 mg on Monday/Wednesday and Fridays.  Re-check in 4 weeks.  

## 2017-09-22 ENCOUNTER — Ambulatory Visit (INDEPENDENT_AMBULATORY_CARE_PROVIDER_SITE_OTHER): Payer: Medicare Other | Admitting: General Practice

## 2017-09-22 DIAGNOSIS — Z7901 Long term (current) use of anticoagulants: Secondary | ICD-10-CM | POA: Diagnosis not present

## 2017-09-22 DIAGNOSIS — Z86718 Personal history of other venous thrombosis and embolism: Secondary | ICD-10-CM

## 2017-09-22 LAB — POCT INR: INR: 2.5 (ref 2.0–3.0)

## 2017-09-22 NOTE — Progress Notes (Signed)
b12 Injection given.   Daiki Dicostanzo J Shaynna Husby, MD  

## 2017-09-22 NOTE — Patient Instructions (Addendum)
Pre visit review using our clinic review tool, if applicable. No additional management support is needed unless otherwise documented below in the visit note.  Continue to take 2.5 mg daily except 5 mg on Monday/Wednesday and Fridays.  Re-check in 4 weeks.

## 2017-09-29 DIAGNOSIS — S8001XA Contusion of right knee, initial encounter: Secondary | ICD-10-CM | POA: Diagnosis not present

## 2017-10-27 ENCOUNTER — Other Ambulatory Visit: Payer: Self-pay

## 2017-10-27 NOTE — Patient Outreach (Signed)
[facility]) Care Management  10/27/2017  HAZELL SIWIK 06-Oct-1940 [id number]   Medication Adherence call to Mrs. [name] patient's telephone number belongs to someone else Epic has the same phone number. patient is due on Simvastatin 10 mg and Benazepril 20 mg. Mrs. Hilbun is showing past due under [place].  [place] [facility] Management Direct Dial [phone]  Fax [phone] [email or web address]

## 2017-10-30 ENCOUNTER — Other Ambulatory Visit: Payer: Self-pay

## 2017-10-30 NOTE — Patient Outreach (Signed)
[facility]) Care Management  10/30/2017  CALLIE FACEY [birth year] [id number]   Medication Adherence call to Mrs. Khamani Sloan left a message for patient to call back patient is due on Simvastatin 10 mg. [facility] said patient last pick up was in April. Mrs. Moorer is showing past due under [place].   [facility] Management Direct Dial [phone]  Fax [phone] [email or web address]

## 2017-11-03 ENCOUNTER — Ambulatory Visit (INDEPENDENT_AMBULATORY_CARE_PROVIDER_SITE_OTHER): Payer: Medicare Other | Admitting: General Practice

## 2017-11-03 DIAGNOSIS — Z7901 Long term (current) use of anticoagulants: Secondary | ICD-10-CM | POA: Diagnosis not present

## 2017-11-03 DIAGNOSIS — Z86718 Personal history of other venous thrombosis and embolism: Secondary | ICD-10-CM

## 2017-11-03 LAB — POCT INR: INR: 3.4 — AB (ref 2.0–3.0)

## 2017-11-03 NOTE — Patient Instructions (Addendum)
Pre visit review using our clinic review tool, if applicable. No additional management support is needed unless otherwise documented below in the visit note.  Hold coumadin today and then continue to take 2.5 mg daily except 5 mg on Monday/Wednesday and Fridays.  Re-check in 4 weeks.

## 2017-11-03 NOTE — Progress Notes (Signed)
Agree with management.  Stacy J Burns, MD  

## 2017-12-01 ENCOUNTER — Ambulatory Visit (INDEPENDENT_AMBULATORY_CARE_PROVIDER_SITE_OTHER): Payer: Medicare Other | Admitting: General Practice

## 2017-12-01 DIAGNOSIS — Z86718 Personal history of other venous thrombosis and embolism: Secondary | ICD-10-CM

## 2017-12-01 DIAGNOSIS — Z7901 Long term (current) use of anticoagulants: Secondary | ICD-10-CM

## 2017-12-01 LAB — POCT INR: INR: 2.9 (ref 2.0–3.0)

## 2017-12-01 NOTE — Progress Notes (Signed)
Agree with management.  Evelean Bigler J Jaysie Benthall, MD  

## 2017-12-01 NOTE — Patient Instructions (Signed)
Pre visit review using our clinic review tool, if applicable. No additional management support is needed unless otherwise documented below in the visit note.  Change dosage and take  2.5 mg daily except 5 mg on Monday/Fridays.  Re-check in 4 weeks.

## 2017-12-22 DIAGNOSIS — M25561 Pain in right knee: Secondary | ICD-10-CM | POA: Diagnosis not present

## 2017-12-23 NOTE — Progress Notes (Deleted)
Subjective:   Bonnie Zhang is a 77 y.o. female who presents for Medicare Annual (Subsequent) preventive examination.  Review of Systems:  No ROS.  Medicare Wellness Visit. Additional risk factors are reflected in the social history.    Sleep patterns: {SX; SLEEP PATTERNS:18802::"feels rested on waking","does not get up to void","gets up *** times nightly to void","sleeps *** hours nightly"}.    Home Safety/Smoke Alarms: Feels safe in home. Smoke alarms in place.  Living environment; residence and Firearm Safety: {Rehab home environment / accessibility:30080::"no firearms","firearms stored safely"}. Seat Belt Safety/Bike Helmet: Wears seat belt.     Objective:     Vitals: There were no vitals taken for this visit.  There is no height or weight on file to calculate BMI.  Advanced Directives 12/22/2016 09/12/2014 08/31/2014 06/22/2014 12/23/2013 09/14/2012 09/10/2012  Does Patient Have a Medical Advance Directive? Yes Yes Yes Yes Yes Patient has advance directive, copy not in chart Patient has advance directive, copy in chart  Type of Advance Directive [place];Living will [place];Living will Living will Living will Living will Living will Living will  Does patient want to make changes to medical advance directive? - No - Patient declined - - No - Patient declined - -  Copy of [place] in Chart? No - copy requested Yes No - copy requested No - copy requested No - copy requested Copy requested from family -  Pre-existing out of facility DNR order (yellow form or pink MOST form) - - - - - - -    Tobacco Social History   Tobacco Use  Smoking Status Never Smoker  Smokeless Tobacco Never Used     Counseling given: Not Answered  Past Medical History:  Diagnosis Date  . Anticoagulated on Coumadin 2001   2 blood clots  . Bruises easily    pt is on Coumadin;last dose of Coumadin 08/05/11 and then Lovenox started 08/07/11  . DVT  (deep vein thrombosis) in pregnancy ([place])     1965 post C section;2001 with prolonged driving while on  HRT  . DVT (deep venous thrombosis) ([place]) 2001   Was on Prempro.    Marland Kitchen GERD (gastroesophageal reflux disease)    Protonix prn  . High cholesterol    takes Simvastating daily  . History of colon polyps 2007   Dr Sharlett Iles  . 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 Hx of colonic polyps    Dr Sharlett Iles  . Hx of migraines    yrs ago   . Hypertension    takes Benazepril,Amlodipine,and Metoprolol daily  . Osteoporosis   . PONV (postoperative nausea and vomiting)   . Primary localized osteoarthritis of left knee   . PVD (peripheral vascular disease) ([place]) 1965, 2001   abnormal venous doppler findings due to recurrent DVT'S left leg  . Right knee DJD    knees  . Shortness of breath dyspnea   . Vitamin B12 deficiency (non anemic) 01/24/2015  . Vitamin D deficiency    takes VIt D daily   Past Surgical History:  Procedure Laterality Date  . ABDOMINAL HYSTERECTOMY    . APPENDECTOMY  1965  . BREAST LUMPECTOMY  1987  . CESAREAN SECTION  1961/1965/1966   X 3  . CHOLECYSTECTOMY  1997  . COLONOSCOPY W/ POLYPECTOMY  2007   Dr  Sharlett Iles; due? 2012  . DILATION AND CURETTAGE OF UTERUS  2012   uterine polyp, Dr Kennon Rounds  . ESOPHAGOGASTRODUODENOSCOPY  1997  . HYSTEROSCOPY  W/D&C N/A 08/05/2012   Procedure: DILATATION AND CURETTAGE /HYSTEROSCOPY;  Surgeon: Donnamae Jude, MD;  Location: [facility];  Service: Gynecology;  Laterality: N/A;  . ROBOTIC ASSISTED TOTAL HYSTERECTOMY WITH BILATERAL SALPINGO OOPHERECTOMY Right 09/14/2012   Procedure: ROBOTIC ASSISTED TOTAL HYSTERECTOMY WITH BILATERAL SALPINGO OOPHORECTOMY ,right pelvic LYMPHNODE dissection;  Surgeon: Imagene Gurney A. Alycia Rossetti, MD;  Location: [facility];  Service: Gynecology;  Laterality: Right;  . TOTAL KNEE ARTHROPLASTY  08/11/2011   Procedure: TOTAL KNEE ARTHROPLASTY;  Surgeon: Lorn Junes, MD;  Location: [facility];  Service: Orthopedics;  Laterality: Right;  DR  [place] THIS CASE  . TOTAL KNEE ARTHROPLASTY Left 09/11/2014  . TOTAL KNEE ARTHROPLASTY Left 09/11/2014   Procedure: TOTAL KNEE ARTHROPLASTY;  Surgeon: Elsie Saas, MD;  Location: [facility];  Service: Orthopedics;  Laterality: Left;  . WISDOM TOOTH EXTRACTION     Family History  Problem Relation Age of Onset  . Kidney disease Mother        ? hypertensive  . Hypertension Mother   . Deep vein thrombosis Mother        post ankle fracture  . Leukemia Father   . Colon cancer Maternal Grandmother   . Diabetes Other        cousins  . Anesthesia problems Neg Hx   . Hypotension Neg Hx   . Malignant hyperthermia Neg Hx   . Pseudochol deficiency Neg Hx   . Stroke Neg Hx   . Heart disease Neg Hx    Social History   Socioeconomic History  . Marital status: Divorced    Spouse name: Not on file  . Number of children: Not on file  . Years of education: Not on file  . Highest education level: Not on file  Occupational History  . Not on file  Social Needs  . Financial resource strain: Not on file  . Food insecurity:    Worry: Not on file    Inability: Not on file  . Transportation needs:    Medical: Not on file    Non-medical: Not on file  Tobacco Use  . Smoking status: Never Smoker  . Smokeless tobacco: Never Used  Substance and Sexual Activity  . Alcohol use: No  . Drug use: No  . Sexual activity: Never    Birth control/protection: Post-menopausal    Comment: retired. has 2 daughters  Lifestyle  . Physical activity:    Days per week: Not on file    Minutes per session: Not on file  . Stress: Not on file  Relationships  . Social connections:    Talks on phone: Not on file    Gets together: Not on file    Attends religious service: Not on file    Active member of club or organization: Not on file    Attends meetings of clubs or organizations: Not on file    Relationship status: Not on file  Other Topics Concern  . Not on file  Social History Narrative     No regular exercise    Outpatient Encounter Medications as of 12/24/2017  Medication Sig  . amLODipine (NORVASC) 5 MG tablet Take 1 tablet (5 mg total) by mouth daily.  . benazepril (LOTENSIN) 20 MG tablet TAKE 1 TABLET BY MOUTH ONCE DAILY  . Cholecalciferol 2000 UNITS CAPS Take 2,000 Units by mouth daily.   . metoprolol succinate (TOPROL-XL) 50 MG 24 hr tablet TAKE 1 TABLET BY MOUTH ONCE DAILY  . pantoprazole (PROTONIX) 40 MG  tablet TAKE ONE TABLET BY MOUTH ONCE DAILY AS NEEDED FOR  ACID  REFLUX  . simvastatin (ZOCOR) 10 MG tablet TAKE 1 TABLET BY MOUTH AT BEDTIME  . warfarin (COUMADIN) 5 MG tablet TAKE AS DIRECTED BY ANTICOAGULATION CLINIC   No facility-administered encounter medications on file as of 12/24/2017.     Activities of Daily Living No flowsheet data found.  Patient Care Team: [name], MD as PCP - General (Internal Medicine) Josue Hector, MD as Consulting Physician (Cardiology) Heath Lark, MD as Consulting Physician (Hematology and Oncology)    Assessment:   This is a routine wellness examination for [facility]. Physical assessment deferred to PCP.   Exercise Activities and Dietary recommendations   Diet (meal preparation, eat out, water intake, caffeinated beverages, dairy products, fruits and vegetables): {Desc; diets:16563}   Goals    . Be healthy, as active and as independent as possible       Fall Risk Fall Risk  12/29/2016 12/22/2016 09/03/2015 05/25/2014 04/14/2013  Falls in the past year? No No No No No   Depression Screen PHQ 2/9 Scores 12/29/2016 12/22/2016 09/03/2015 05/25/2014  PHQ - 2 Score 0 0 0 0  PHQ- 9 Score - 1 - -     Cognitive Function MMSE - Mini Mental State Exam 12/22/2016  Orientation to time 5  Orientation to Place 5  Registration 3  Attention/ Calculation 5  Recall 2  Language- name 2 objects 2  Language- repeat 1  Language- follow 3 step command 3  Language- read & follow direction 1  Write a sentence 1  Copy design 1   Total score 29        Immunization History  Administered Date(s) Administered  . Td 03/21/2010   Screening Tests Health Maintenance  Topic Date Due  . PNA vac Low Risk Adult (1 of 2 - PCV13) 08/29/2006  . INFLUENZA VACCINE  06/30/2018 (Originally 10/29/2017)  . DEXA SCAN  07/12/2018  . TETANUS/TDAP  03/21/2020      Plan:      I have personally reviewed and noted the following in the patient's chart:   . Medical and social history . Use of alcohol, tobacco or illicit drugs  . Current medications and supplements . Functional ability and status . Nutritional status . Physical activity . Advanced directives . List of other physicians . Vitals . Screenings to include cognitive, depression, and falls . Referrals and appointments  In addition, I have reviewed and discussed with patient certain preventive protocols, quality metrics, and best practice recommendations. A written personalized care plan for preventive services as well as general preventive health recommendations were provided to patient.     Michiel Cowboy, RN  12/23/2017

## 2017-12-24 ENCOUNTER — Ambulatory Visit: Payer: Medicare Other

## 2017-12-27 NOTE — Progress Notes (Signed)
Subjective:    Patient ID: Bonnie Zhang, female    DOB: 22-Dec-1940, 77 y.o.   MRN: [id number]  HPI The patient is here for follow up.  She has been on prednisone for the past week - it is from ortho after twisting her knee.  She has to take it a few more days.   She thinks that is why her BP is elevated.    Hyperlipidemia: She is taking her medication daily. She is compliant with a low fat/cholesterol diet. She is not exercising regularly. She denies myalgias.   Hypertension: She is taking her medication daily. She is compliant with a low sodium diet.  She denies chest pain, palpitations, edema, shortness of breath and regular headaches. She is not exercising regularly.     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  She is taking warfarin daily as prescribed. She denies abnormal bruising or bleeding.    GERD:  She is taking her medication only as needed as prescribed.  She denies any GERD symptoms over the past month and feels her GERD is well controlled.   OP:  She will restart vitamin d.  She is not exercising.  She would like to hold off on Prolia-her next bone density is in 6 months.  Medications and allergies reviewed with patient and updated if appropriate.  Patient Active Problem List   Diagnosis Date Noted  . Hyperglycemia 06/29/2017  . Vertigo 04/07/2017  . Long term (current) use of anticoagulants 01/07/2017  . Carotid artery disease ([place]) 02/01/2016  . Bilateral leg edema 06/21/2015  . Vitamin B12 deficiency (non anemic) 01/24/2015  . 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 Primary localized osteoarthritis of left knee   . Encounter for therapeutic drug monitoring 06/07/2013  . GERD (gastroesophageal reflux disease)   . PVD (peripheral vascular disease) ([place])   . 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 10/09/2010  . Osteoporosis 03/21/2010  .  History of colonic polyps 03/20/2008  . Vitamin D deficiency 08/25/2007  . HYPERLIPIDEMIA 03/18/2007  . Dysmetabolic syndrome X 46/56/8127  . Essential hypertension 03/18/2007  . DVT, HX OF 07/23/2006    Current Outpatient Medications on File Prior to Visit  Medication Sig Dispense Refill  . amLODipine (NORVASC) 5 MG tablet Take 1 tablet (5 mg total) by mouth daily. 90 tablet 1  . benazepril (LOTENSIN) 20 MG tablet TAKE 1 TABLET BY MOUTH ONCE DAILY 90 tablet 1  . metoprolol succinate (TOPROL-XL) 50 MG 24 hr tablet TAKE 1 TABLET BY MOUTH ONCE DAILY 90 tablet 3  . pantoprazole (PROTONIX) 40 MG tablet TAKE ONE TABLET BY MOUTH ONCE DAILY AS NEEDED FOR  ACID  REFLUX 90 tablet 1  . simvastatin (ZOCOR) 10 MG tablet TAKE 1 TABLET BY MOUTH AT BEDTIME 90 tablet 2  . warfarin (COUMADIN) 5 MG tablet TAKE AS DIRECTED BY ANTICOAGULATION CLINIC 90 tablet 1   No current facility-administered medications on file prior to visit.     Past Medical History:  Diagnosis Date  . Anticoagulated on Coumadin 2001   2 blood clots  . Bruises easily    pt is on Coumadin;last dose of Coumadin 08/05/11 and then Lovenox started 08/07/11  . DVT (deep vein thrombosis) in pregnancy     1965 post C section;2001 with prolonged driving while on  HRT  . DVT (deep venous thrombosis) ([place]) 2001   Was on Prempro.    Marland Kitchen  GERD (gastroesophageal reflux disease)    Protonix prn  . High cholesterol    takes Simvastating daily  . History of colon polyps 2007   Dr Sharlett Iles  . 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 Hx of colonic polyps    Dr Sharlett Iles  . Hx of migraines    yrs ago   . Hypertension    takes Benazepril,Amlodipine,and Metoprolol daily  . Osteoporosis   . PONV (postoperative nausea and vomiting)   . Primary localized osteoarthritis of left knee   . PVD (peripheral vascular disease) ([place]) 1965, 2001   abnormal venous doppler findings due to recurrent DVT'S left leg  . Right knee DJD    knees  . Shortness of  breath dyspnea   . Vitamin B12 deficiency (non anemic) 01/24/2015  . Vitamin D deficiency    takes VIt D daily    Past Surgical History:  Procedure Laterality Date  . ABDOMINAL HYSTERECTOMY    . APPENDECTOMY  1965  . BREAST LUMPECTOMY  1987  . CESAREAN SECTION  1961/1965/1966   X 3  . CHOLECYSTECTOMY  1997  . COLONOSCOPY W/ POLYPECTOMY  2007   Dr  Sharlett Iles; due? 2012  . DILATION AND CURETTAGE OF UTERUS  2012   uterine polyp, Dr Kennon Rounds  . ESOPHAGOGASTRODUODENOSCOPY  1997  . HYSTEROSCOPY W/D&C N/A 08/05/2012   Procedure: DILATATION AND CURETTAGE /HYSTEROSCOPY;  Surgeon: Donnamae Jude, MD;  Location: [facility];  Service: Gynecology;  Laterality: N/A;  . ROBOTIC ASSISTED TOTAL HYSTERECTOMY WITH BILATERAL SALPINGO OOPHERECTOMY Right 09/14/2012   Procedure: ROBOTIC ASSISTED TOTAL HYSTERECTOMY WITH BILATERAL SALPINGO OOPHORECTOMY ,right pelvic LYMPHNODE dissection;  Surgeon: Imagene Gurney A. Alycia Rossetti, MD;  Location: [facility];  Service: Gynecology;  Laterality: Right;  . TOTAL KNEE ARTHROPLASTY  08/11/2011   Procedure: TOTAL KNEE ARTHROPLASTY;  Surgeon: Lorn Junes, MD;  Location: [facility];  Service: Orthopedics;  Laterality: Right;  DR [place] THIS CASE  . TOTAL KNEE ARTHROPLASTY Left 09/11/2014  . TOTAL KNEE ARTHROPLASTY Left 09/11/2014   Procedure: TOTAL KNEE ARTHROPLASTY;  Surgeon: Elsie Saas, MD;  Location: [facility];  Service: Orthopedics;  Laterality: Left;  . WISDOM TOOTH EXTRACTION      Social History   Socioeconomic History  . Marital status: Divorced    Spouse name: Not on file  . Number of children: Not on file  . Years of education: Not on file  . Highest education level: Not on file  Occupational History  . Not on file  Social Needs  . Financial resource strain: Not on file  . Food insecurity:    Worry: Not on file    Inability: Not on file  . Transportation needs:    Medical: Not on file    Non-medical: Not on file  Tobacco Use  . Smoking status: Never Smoker    . Smokeless tobacco: Never Used  Substance and Sexual Activity  . Alcohol use: No  . Drug use: No  . Sexual activity: Never    Birth control/protection: Post-menopausal    Comment: retired. has 2 daughters  Lifestyle  . Physical activity:    Days per week: Not on file    Minutes per session: Not on file  . Stress: Not on file  Relationships  . Social connections:    Talks on phone: Not on file    Gets together: Not on file    Attends religious service: Not on file    Active member of club or organization: Not on file  Attends meetings of clubs or organizations: Not on file    Relationship status: Not on file  Other Topics Concern  . Not on file  Social History Narrative   No regular exercise    Family History  Problem Relation Age of Onset  . Kidney disease Mother        ? hypertensive  . Hypertension Mother   . Deep vein thrombosis Mother        post ankle fracture  . Leukemia Father   . Colon cancer Maternal Grandmother   . Diabetes Other        cousins  . Anesthesia problems Neg Hx   . Hypotension Neg Hx   . Malignant hyperthermia Neg Hx   . Pseudochol deficiency Neg Hx   . Stroke Neg Hx   . Heart disease Neg Hx     Review of Systems  Constitutional: Negative for chills and fever.  Respiratory: Negative for cough, shortness of breath and wheezing.   Cardiovascular: Negative for chest pain, palpitations and leg swelling.  Musculoskeletal: Positive for arthralgias.  Neurological: Negative for light-headedness and headaches.       Objective:   Vitals:   12/29/17 0856  BP: (!) 158/84  Pulse: 75  Resp: 16  Temp: 98.6 F (37 C)  SpO2: 98%   BP Readings from Last 3 Encounters:  12/29/17 (!) 158/84  06/29/17 126/66  04/07/17 134/82   Wt Readings from Last 3 Encounters:  12/29/17 177 lb (80.3 kg)  06/29/17 181 lb (82.1 kg)  04/07/17 176 lb (79.8 kg)   Body mass index is 34.57 kg/m.   Physical Exam    Constitutional: Appears well-developed  and well-nourished. No distress.  HENT:  Head: Normocephalic and atraumatic.  Neck: Neck supple. No tracheal deviation present. No thyromegaly present.  No cervical lymphadenopathy Cardiovascular: Normal rate, regular rhythm and normal heart sounds.   No murmur heard. No carotid bruit .  No edema.   Increased varicose veins in left ankle (h/o dvt) Pulmonary/Chest: Effort normal and breath sounds normal. No respiratory distress. No has no wheezes. No rales.  Skin: Skin is warm and dry. Not diaphoretic.  Psychiatric: Normal mood and affect. Behavior is normal.      Assessment & Plan:    See Problem List for Assessment and Plan of chronic medical problems.

## 2017-12-27 NOTE — Patient Instructions (Addendum)
  Tests ordered today. Your results will be released to [facility] (or called to you) after review, usually within 72hours after test completion. If any changes need to be made, you will be notified at that same time.   No immunizations administered today.   Medications reviewed and updated.  Changes include :   none    Please followup in 6 months

## 2017-12-29 ENCOUNTER — Other Ambulatory Visit (INDEPENDENT_AMBULATORY_CARE_PROVIDER_SITE_OTHER): Payer: Medicare Other

## 2017-12-29 ENCOUNTER — Ambulatory Visit (INDEPENDENT_AMBULATORY_CARE_PROVIDER_SITE_OTHER): Payer: Medicare Other | Admitting: Internal Medicine

## 2017-12-29 ENCOUNTER — Ambulatory Visit (INDEPENDENT_AMBULATORY_CARE_PROVIDER_SITE_OTHER): Payer: Medicare Other | Admitting: General Practice

## 2017-12-29 ENCOUNTER — Encounter: Payer: Self-pay | Admitting: Internal Medicine

## 2017-12-29 VITALS — BP 158/84 | HR 75 | Temp 98.6°F | Resp 16 | Ht 60.0 in | Wt 177.0 lb

## 2017-12-29 DIAGNOSIS — Z86718 Personal history of other venous thrombosis and embolism: Secondary | ICD-10-CM

## 2017-12-29 DIAGNOSIS — M810 Age-related osteoporosis without current pathological fracture: Secondary | ICD-10-CM

## 2017-12-29 DIAGNOSIS — D6851 Activated protein C resistance: Secondary | ICD-10-CM

## 2017-12-29 DIAGNOSIS — I10 Essential (primary) hypertension: Secondary | ICD-10-CM

## 2017-12-29 DIAGNOSIS — R739 Hyperglycemia, unspecified: Secondary | ICD-10-CM

## 2017-12-29 DIAGNOSIS — K219 Gastro-esophageal reflux disease without esophagitis: Secondary | ICD-10-CM | POA: Diagnosis not present

## 2017-12-29 DIAGNOSIS — E782 Mixed hyperlipidemia: Secondary | ICD-10-CM | POA: Diagnosis not present

## 2017-12-29 DIAGNOSIS — Z7901 Long term (current) use of anticoagulants: Secondary | ICD-10-CM

## 2017-12-29 LAB — CBC WITH DIFFERENTIAL/PLATELET
Basophils Absolute: 0 10*3/uL (ref 0.0–0.1)
Basophils Relative: 0.2 % (ref 0.0–3.0)
Eosinophils Absolute: 0 10*3/uL (ref 0.0–0.7)
Eosinophils Relative: 0.1 % (ref 0.0–5.0)
HCT: 45.9 % (ref 36.0–46.0)
Hemoglobin: 15.5 g/dL — ABNORMAL HIGH (ref 12.0–15.0)
Lymphocytes Relative: 20.7 % (ref 12.0–46.0)
Lymphs Abs: 2.4 10*3/uL (ref 0.7–4.0)
MCHC: 33.8 g/dL (ref 30.0–36.0)
MCV: 87.5 fl (ref 78.0–100.0)
Monocytes Absolute: 1 10*3/uL (ref 0.1–1.0)
Monocytes Relative: 8.1 % (ref 3.0–12.0)
Neutro Abs: 8.4 10*3/uL — ABNORMAL HIGH (ref 1.4–7.7)
Neutrophils Relative %: 70.9 % (ref 43.0–77.0)
Platelets: 214 10*3/uL (ref 150.0–400.0)
RBC: 5.24 Mil/uL — ABNORMAL HIGH (ref 3.87–5.11)
RDW: 13.5 % (ref 11.5–15.5)
WBC: 11.8 10*3/uL — ABNORMAL HIGH (ref 4.0–10.5)

## 2017-12-29 LAB — LIPID PANEL
Cholesterol: 152 mg/dL (ref 0–200)
HDL: 65.4 mg/dL (ref >39.00)
LDL Cholesterol: 58 mg/dL (ref 0–99)
NonHDL: 87.05
Total CHOL/HDL Ratio: 2
Triglycerides: 145 mg/dL (ref 0.0–149.0)
VLDL: 29 mg/dL (ref 0.0–40.0)

## 2017-12-29 LAB — COMPREHENSIVE METABOLIC PANEL
ALT: 12 U/L (ref 0–35)
AST: 13 U/L (ref 0–37)
Albumin: 4.1 g/dL (ref 3.5–5.2)
Alkaline Phosphatase: 80 U/L (ref 39–117)
BUN: 17 mg/dL (ref 6–23)
CO2: 26 mEq/L (ref 19–32)
Calcium: 9.6 mg/dL (ref 8.4–10.5)
Chloride: 102 mEq/L (ref 96–112)
Creatinine, Ser: 0.85 mg/dL (ref 0.40–1.20)
GFR: 69.05 mL/min (ref >60.00)
Glucose, Bld: 103 mg/dL — ABNORMAL HIGH (ref 70–99)
Potassium: 4.6 mEq/L (ref 3.5–5.1)
Sodium: 137 mEq/L (ref 135–145)
Total Bilirubin: 0.6 mg/dL (ref 0.2–1.2)
Total Protein: 7.6 g/dL (ref 6.0–8.3)

## 2017-12-29 LAB — HEMOGLOBIN A1C: Hgb A1c MFr Bld: 5.5 % (ref 4.6–6.5)

## 2017-12-29 LAB — POCT INR: INR: 2.6 (ref 2.0–3.0)

## 2017-12-29 NOTE — Assessment & Plan Note (Signed)
BP Readings from Last 3 Encounters:  12/29/17 (!) 158/84  06/29/17 126/66  04/07/17 134/82   BP elevated today - probably due to being on steroids Has been well controlled Continue current medications at current doses cmp

## 2017-12-29 NOTE — Assessment & Plan Note (Signed)
Check lipid panel  Continue daily statin Regular exercise and healthy diet encouraged  

## 2017-12-29 NOTE — Patient Instructions (Addendum)
Pre visit review using our clinic review tool, if applicable. No additional management support is needed unless otherwise documented below in the visit note.  Change dosage and take  2.5 mg daily except 5 mg on Monday/Fridays.  Re-check in 4 weeks.

## 2017-12-29 NOTE — Assessment & Plan Note (Signed)
Has had mildly elevated sugars in the past We will monitor A1c

## 2017-12-29 NOTE — Assessment & Plan Note (Signed)
We have discussed Prolia in the past, but at this point she would like to hold off Next DEXA due in 6 months-we will see what that shows and discuss Prolia again Not currently taking calcium or vitamin D-she will start vitamin D daily Not currently exercising due to knee injury-encouraged regular exercise when able

## 2017-12-29 NOTE — Progress Notes (Signed)
Agree with management.  Ra Pfiester J Ricky Gallery, MD  

## 2017-12-29 NOTE — Assessment & Plan Note (Addendum)
DVT left lower extremity x2 Lifelong anticoagulation-warfarin Following with [facility] for INR checks and adjustment of medication-appointment today

## 2017-12-29 NOTE — Assessment & Plan Note (Signed)
Takes Protonix as needed only Overall GERD controlled and easily managed with Protonix as needed Continue

## 2017-12-30 ENCOUNTER — Other Ambulatory Visit: Payer: Self-pay | Admitting: Internal Medicine

## 2018-01-06 NOTE — Progress Notes (Addendum)
Subjective:   Bonnie Zhang is a 77 y.o. female who presents for Medicare Annual (Subsequent) preventive examination.  Review of Systems:  No ROS.  Medicare Wellness Visit. Additional risk factors are reflected in the social history.    Sleep patterns: gets up 1-2 times nightly to void and sleeps 6-7 hours nightly.    Home Safety/Smoke Alarms: Feels safe in home. Smoke alarms in place.  Living environment; residence and Firearm Safety: 1-story house/ trailer, no firearms.Lives alone, no needs for DME, good support system Seat Belt Safety/Bike Helmet: Wears seat belt.      Objective:     Vitals: BP 138/64   Pulse (!) 53   Resp 17   Ht 5' (1.524 m)   Wt 179 lb (81.2 kg)   SpO2 98%   BMI 34.96 kg/m   Body mass index is 34.96 kg/m.  Advanced Directives 01/07/2018 12/22/2016 09/12/2014 08/31/2014 06/22/2014 12/23/2013 09/14/2012  Does Patient Have a Medical Advance Directive? Yes Yes Yes Yes Yes Yes Patient has advance directive, copy not in chart  Type of Advance Directive Living will [place];Living will [place];Living will Living will Living will Living will Living will  Does patient want to make changes to medical advance directive? - - No - Patient declined - - No - Patient declined -  Copy of [place] in Chart? - No - copy requested Yes No - copy requested No - copy requested No - copy requested Copy requested from family  Pre-existing out of facility DNR order (yellow form or pink MOST form) - - - - - - -    Tobacco Social History   Tobacco Use  Smoking Status Never Smoker  Smokeless Tobacco Never Used     Counseling given: Not Answered  Past Medical History:  Diagnosis Date  . Anticoagulated on Coumadin 2001   2 blood clots  . Bruises easily    pt is on Coumadin;last dose of Coumadin 08/05/11 and then Lovenox started 08/07/11  . DVT (deep vein thrombosis) in pregnancy     1965 post C section;2001 with  prolonged driving while on  HRT  . DVT (deep venous thrombosis) ([place]) 2001   Was on Prempro.    Marland Kitchen GERD (gastroesophageal reflux disease)    Protonix prn  . High cholesterol    takes Simvastating daily  . History of colon polyps 2007   Dr Sharlett Iles  . 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 Hx of colonic polyps    Dr Sharlett Iles  . Hx of migraines    yrs ago   . Hypertension    takes Benazepril,Amlodipine,and Metoprolol daily  . Osteoporosis   . PONV (postoperative nausea and vomiting)   . Primary localized osteoarthritis of left knee   . PVD (peripheral vascular disease) ([place]) 1965, 2001   abnormal venous doppler findings due to recurrent DVT'S left leg  . Right knee DJD    knees  . Shortness of breath dyspnea   . Vitamin B12 deficiency (non anemic) 01/24/2015  . Vitamin D deficiency    takes VIt D daily   Past Surgical History:  Procedure Laterality Date  . ABDOMINAL HYSTERECTOMY    . APPENDECTOMY  1965  . BREAST LUMPECTOMY  1987  . CESAREAN SECTION  1961/1965/1966   X 3  . CHOLECYSTECTOMY  1997  . COLONOSCOPY W/ POLYPECTOMY  2007   Dr  Sharlett Iles; due? 2012  . [place] OF UTERUS  2012  uterine polyp, Dr Kennon Rounds  . ESOPHAGOGASTRODUODENOSCOPY  1997  . HYSTEROSCOPY W/D&C N/A 08/05/2012   Procedure: DILATATION AND CURETTAGE /HYSTEROSCOPY;  Surgeon: Donnamae Jude, MD;  Location: [facility];  Service: Gynecology;  Laterality: N/A;  . ROBOTIC ASSISTED TOTAL HYSTERECTOMY WITH BILATERAL SALPINGO OOPHERECTOMY Right 09/14/2012   Procedure: ROBOTIC ASSISTED TOTAL HYSTERECTOMY WITH BILATERAL SALPINGO OOPHORECTOMY ,right pelvic LYMPHNODE dissection;  Surgeon: Imagene Gurney A. Alycia Rossetti, MD;  Location: [facility];  Service: Gynecology;  Laterality: Right;  . TOTAL KNEE ARTHROPLASTY  08/11/2011   Procedure: TOTAL KNEE ARTHROPLASTY;  Surgeon: Lorn Junes, MD;  Location: [facility];  Service: Orthopedics;  Laterality: Right;  DR [place] THIS CASE  . TOTAL KNEE ARTHROPLASTY Left  09/11/2014  . TOTAL KNEE ARTHROPLASTY Left 09/11/2014   Procedure: TOTAL KNEE ARTHROPLASTY;  Surgeon: Elsie Saas, MD;  Location: [facility];  Service: Orthopedics;  Laterality: Left;  . WISDOM TOOTH EXTRACTION     Family History  Problem Relation Age of Onset  . Kidney disease Mother        ? hypertensive  . Hypertension Mother   . Deep vein thrombosis Mother        post ankle fracture  . Leukemia Father   . Colon cancer Maternal Grandmother   . Diabetes Other        cousins  . Anesthesia problems Neg Hx   . Hypotension Neg Hx   . Malignant hyperthermia Neg Hx   . Pseudochol deficiency Neg Hx   . Stroke Neg Hx   . Heart disease Neg Hx    Social History   Socioeconomic History  . Marital status: Divorced    Spouse name: Not on file  . Number of children: 1  . Years of education: Not on file  . Highest education level: Not on file  Occupational History  . Not on file  Social Needs  . Financial resource strain: Not hard at all  . Food insecurity:    Worry: Never true    Inability: Never true  . Transportation needs:    Medical: No    Non-medical: No  Tobacco Use  . Smoking status: Never Smoker  . Smokeless tobacco: Never Used  Substance and Sexual Activity  . Alcohol use: No  . Drug use: No  . Sexual activity: Never    Birth control/protection: Post-menopausal    Comment: retired. has 2 daughters  Lifestyle  . Physical activity:    Days per week: 0 days    Minutes per session: 0 min  . Stress: Not at all  Relationships  . Social connections:    Talks on phone: More than three times a week    Gets together: More than three times a week    Attends religious service: Never    Active member of club or organization: Yes    Attends meetings of clubs or organizations: More than 4 times per year    Relationship status: Divorced  Other Topics Concern  . Not on file  Social History Narrative   No regular exercise    Outpatient Encounter Medications as of  01/07/2018  Medication Sig  . amLODipine (NORVASC) 5 MG tablet Take 1 tablet (5 mg total) by mouth daily.  . benazepril (LOTENSIN) 20 MG tablet TAKE 1 TABLET BY MOUTH ONCE DAILY  . metoprolol succinate (TOPROL-XL) 50 MG 24 hr tablet TAKE 1 TABLET BY MOUTH ONCE DAILY  . pantoprazole (PROTONIX) 40 MG tablet TAKE ONE TABLET BY MOUTH ONCE DAILY  AS NEEDED FOR  ACID  REFLUX  . simvastatin (ZOCOR) 10 MG tablet TAKE 1 TABLET BY MOUTH AT BEDTIME  . warfarin (COUMADIN) 5 MG tablet TAKE AS DIRECTED BY ANTICOAGULATION CLINIC   No facility-administered encounter medications on file as of 01/07/2018.     Activities of Daily Living In your present state of health, do you have any difficulty performing the following activities: 01/07/2018  Hearing? N  Vision? N  Difficulty concentrating or making decisions? N  Walking or climbing stairs? N  Dressing or bathing? N  Doing errands, shopping? N  Preparing Food and eating ? N  Using the Toilet? N  In the past six months, have you accidently leaked urine? N  Do you have problems with loss of bowel control? N  Managing your Medications? N  Managing your Finances? N  Housekeeping or managing your Housekeeping? N  Some recent data might be hidden    Patient Care Team: [name], MD as PCP - General (Internal Medicine) Josue Hector, MD as Consulting Physician (Cardiology) Heath Lark, MD as Consulting Physician (Hematology and Oncology) Elsie Saas, MD as Consulting Physician (Orthopedic Surgery)    Assessment:   This is a routine wellness examination for [facility]. Physical assessment deferred to PCP.   Exercise Activities and Dietary recommendations Current Exercise Habits: The patient does not participate in regular exercise at present(chair exercise print-outs provided), Exercise limited by: orthopedic condition(s)  Diet (meal preparation, eat out, water intake, caffeinated beverages, dairy products, fruits and vegetables): in  general, a "healthy" diet  , well balanced   Reviewed heart healthy diet. Encouraged patient to increase daily water and healthy fluid intake.  Goals    . Be healthy, as active and as independent as possible    . Patient Stated     Stay physically and socially active, enjoy life and family.       Fall Risk Fall Risk  01/07/2018 12/29/2017 12/29/2016 12/22/2016 09/03/2015  Falls in the past year? Yes No No No No  Number falls in past yr: 1 - - - -  Injury with Fall? No - - - -  Follow up Falls prevention discussed - - - -    Depression Screen PHQ 2/9 Scores 01/07/2018 12/29/2017 12/29/2016 12/22/2016  PHQ - 2 Score 0 0 0 0  PHQ- 9 Score 0 3 - 1     Cognitive Function MMSE - Mini Mental State Exam 01/07/2018 12/22/2016  Orientation to time 5 5  Orientation to Place 5 5  Registration 3 3  Attention/ Calculation 5 5  Recall 3 2  Language- name 2 objects 2 2  Language- repeat 1 1  Language- follow 3 step command 3 3  Language- read & follow direction 1 1  Write a sentence 1 1  Copy design 1 1  Total score 30 29        Immunization History  Administered Date(s) Administered  . Td 03/21/2010   Screening Tests Health Maintenance  Topic Date Due  . INFLUENZA VACCINE  06/30/2018 (Originally 10/29/2017)  . PNA vac Low Risk Adult (1 of 2 - PCV13) 12/30/2018 (Originally 08/29/2006)  . DEXA SCAN  07/12/2018  . TETANUS/TDAP  03/21/2020      Plan:     Continue doing brain stimulating activities (puzzles, reading, adult coloring books, staying active) to keep memory sharp.   Continue to eat heart healthy diet (full of fruits, vegetables, whole grains, lean protein, water--limit salt, fat, and sugar intake) and increase  physical activity as tolerated.  I have personally reviewed and noted the following in the patient's chart:   . Medical and social history . Use of alcohol, tobacco or illicit drugs  . Current medications and supplements . Functional ability and  status . Nutritional status . Physical activity . Advanced directives . List of other physicians . Vitals . Screenings to include cognitive, depression, and falls . Referrals and appointments  In addition, I have reviewed and discussed with patient certain preventive protocols, quality metrics, and best practice recommendations. A written personalized care plan for preventive services as well as general preventive health recommendations were provided to patient.     Michiel Cowboy, RN  01/07/2018   Medical screening examination/treatment/procedure(s) were performed by non-physician practitioner and as supervising physician I was immediately available for consultation/collaboration. I agree with above. [name], MD

## 2018-01-07 ENCOUNTER — Ambulatory Visit (INDEPENDENT_AMBULATORY_CARE_PROVIDER_SITE_OTHER): Payer: Medicare Other | Admitting: *Deleted

## 2018-01-07 VITALS — BP 138/64 | HR 53 | Resp 17 | Ht 60.0 in | Wt 179.0 lb

## 2018-01-07 DIAGNOSIS — Z0000 Encounter for general adult medical examination without abnormal findings: Secondary | ICD-10-CM | POA: Diagnosis not present

## 2018-01-07 NOTE — Patient Instructions (Signed)
Continue doing brain stimulating activities (puzzles, reading, adult coloring books, staying active) to keep memory sharp.   Continue to eat heart healthy diet (full of fruits, vegetables, whole grains, lean protein, water--limit salt, fat, and sugar intake) and increase physical activity as tolerated.   Bonnie Zhang , Thank you for taking time to come for your Medicare Wellness Visit. I appreciate your ongoing commitment to your health goals. Please review the following plan we discussed and let me know if I can assist you in the future.   These are the goals we discussed: Goals    . Be healthy, as active and as independent as possible    . Patient Stated     Stay physically and socially active, enjoy life and family.       This is a list of the screening recommended for you and due dates:  Health Maintenance  Topic Date Due  . Flu Shot  06/30/2018*  . Pneumonia vaccines (1 of 2 - PCV13) 12/30/2018*  . DEXA scan (bone density measurement)  07/12/2018  . Tetanus Vaccine  03/21/2020  *Topic was postponed. The date shown is not the original due date.   Health Maintenance, Female Adopting a healthy lifestyle and getting preventive care can go a long way to promote health and wellness. Talk with your health care provider about what schedule of regular examinations is right for you. This is a good chance for you to check in with your provider about disease prevention and staying healthy. In between checkups, there are plenty of things you can do on your own. Experts have done a lot of research about which lifestyle changes and preventive measures are most likely to keep you healthy. Ask your health care provider for more information. Weight and diet Eat a healthy diet  Be sure to include plenty of vegetables, fruits, low-fat dairy products, and lean protein.  Do not eat a lot of foods high in solid fats, added sugars, or salt.  Get regular exercise. This is one of the most important  things you can do for your health. ? Most adults should exercise for at least 150 minutes each week. The exercise should increase your heart rate and make you sweat (moderate-intensity exercise). ? Most adults should also do strengthening exercises at least twice a week. This is in addition to the moderate-intensity exercise.  Maintain a healthy weight  Body mass index (BMI) is a measurement that can be used to identify possible weight problems. It estimates body fat based on height and weight. Your health care provider can help determine your BMI and help you achieve or maintain a healthy weight.  For females 79 years of age and older: ? A BMI below 18.5 is considered underweight. ? A BMI of 18.5 to 24.9 is normal. ? A BMI of 25 to 29.9 is considered overweight. ? A BMI of 30 and above is considered obese.  Watch levels of cholesterol and blood lipids  You should start having your blood tested for lipids and cholesterol at 76 years of age, then have this test every 5 years.  You may need to have your cholesterol levels checked more often if: ? Your lipid or cholesterol levels are high. ? You are older than 76 years of age. ? You are at high risk for heart disease.  Cancer screening Lung Cancer  Lung cancer screening is recommended for adults 13-68 years old who are at high risk for lung cancer because of a history of smoking.  A yearly low-dose CT scan of the lungs is recommended for people who: ? Currently smoke. ? Have quit within the past 15 years. ? Have at least a 30-pack-year history of smoking. A pack year is smoking an average of one pack of cigarettes a day for 1 year.  Yearly screening should continue until it has been 15 years since you quit.  Yearly screening should stop if you develop a health problem that would prevent you from having lung cancer treatment.  Breast Cancer  Practice breast self-awareness. This means understanding how your breasts normally appear  and feel.  It also means doing regular breast self-exams. Let your health care provider know about any changes, no matter how small.  If you are in your 20s or 30s, you should have a clinical breast exam (CBE) by a health care provider every 1-3 years as part of a regular health exam.  If you are 34 or older, have a CBE every year. Also consider having a breast X-ray (mammogram) every year.  If you have a family history of breast cancer, talk to your health care provider about genetic screening.  If you are at high risk for breast cancer, talk to your health care provider about having an MRI and a mammogram every year.  Breast cancer gene (BRCA) assessment is recommended for women who have family members with BRCA-related cancers. BRCA-related cancers include: ? Breast. ? Ovarian. ? Tubal. ? Peritoneal cancers.  Results of the assessment will determine the need for genetic counseling and BRCA1 and BRCA2 testing.  Cervical Cancer Your health care provider may recommend that you be screened regularly for cancer of the pelvic organs (ovaries, uterus, and vagina). This screening involves a pelvic examination, including checking for microscopic changes to the surface of your cervix (Pap test). You may be encouraged to have this screening done every 3 years, beginning at age 72.  For women ages 25-65, health care providers may recommend pelvic exams and Pap testing every 3 years, or they may recommend the Pap and pelvic exam, combined with testing for human papilloma virus (HPV), every 5 years. Some types of HPV increase your risk of cervical cancer. Testing for HPV may also be done on women of any age with unclear Pap test results.  Other health care providers may not recommend any screening for nonpregnant women who are considered low risk for pelvic cancer and who do not have symptoms. Ask your health care provider if a screening pelvic exam is right for you.  If you have had past treatment  for cervical cancer or a condition that could lead to cancer, you need Pap tests and screening for cancer for at least 20 years after your treatment. If Pap tests have been discontinued, your risk factors (such as having a new sexual partner) need to be reassessed to determine if screening should resume. Some women have medical problems that increase the chance of getting cervical cancer. In these cases, your health care provider may recommend more frequent screening and Pap tests.  Colorectal Cancer  This type of cancer can be detected and often prevented.  Routine colorectal cancer screening usually begins at 76 years of age and continues through 76 years of age.  Your health care provider may recommend screening at an earlier age if you have risk factors for colon cancer.  Your health care provider may also recommend using home test kits to check for hidden blood in the stool.  A small camera at the end  of a tube can be used to examine your colon directly (sigmoidoscopy or colonoscopy). This is done to check for the earliest forms of colorectal cancer.  Routine screening usually begins at age 41.  Direct examination of the colon should be repeated every 5-10 years through 75 years of age. However, you may need to be screened more often if early forms of precancerous polyps or small growths are found.  Skin Cancer  Check your skin from head to toe regularly.  Tell your health care provider about any new moles or changes in moles, especially if there is a change in a mole's shape or color.  Also tell your health care provider if you have a mole that is larger than the size of a pencil eraser.  Always use sunscreen. Apply sunscreen liberally and repeatedly throughout the day.  Protect yourself by wearing long sleeves, pants, a wide-brimmed hat, and sunglasses whenever you are outside.  Heart disease, diabetes, and high blood pressure  High blood pressure causes heart disease and  increases the risk of stroke. High blood pressure is more likely to develop in: ? People who have blood pressure in the high end of the normal range (130-139/85-89 mm Hg). ? People who are overweight or obese. ? People who are African American.  If you are 41-7 years of age, have your blood pressure checked every 3-5 years. If you are 78 years of age or older, have your blood pressure checked every year. You should have your blood pressure measured twice-once when you are at a hospital or clinic, and once when you are not at a hospital or clinic. Record the average of the two measurements. To check your blood pressure when you are not at a hospital or clinic, you can use: ? An automated blood pressure machine at a pharmacy. ? A home blood pressure monitor.  If you are between 71 years and 24 years old, ask your health care provider if you should take aspirin to prevent strokes.  Have regular diabetes screenings. This involves taking a blood sample to check your fasting blood sugar level. ? If you are at a normal weight and have a low risk for diabetes, have this test once every three years after 76 years of age. ? If you are overweight and have a high risk for diabetes, consider being tested at a younger age or more often. Preventing infection Hepatitis B  If you have a higher risk for hepatitis B, you should be screened for this virus. You are considered at high risk for hepatitis B if: ? You were born in a country where hepatitis B is common. Ask your health care provider which countries are considered high risk. ? Your parents were born in a high-risk country, and you have not been immunized against hepatitis B (hepatitis B vaccine). ? You have HIV or AIDS. ? You use needles to inject street drugs. ? You live with someone who has hepatitis B. ? You have had sex with someone who has hepatitis B. ? You get hemodialysis treatment. ? You take certain medicines for conditions, including  cancer, organ transplantation, and autoimmune conditions.  Hepatitis C  Blood testing is recommended for: ? Everyone born from [birth year] through 1964. ? Anyone with known risk factors for hepatitis C.  Sexually transmitted infections (STIs)  You should be screened for sexually transmitted infections (STIs) including gonorrhea and chlamydia if: ? You are sexually active and are younger than 76 years of age. ? You are  older than 76 years of age and your health care provider tells you that you are at risk for this type of infection. ? Your sexual activity has changed since you were last screened and you are at an increased risk for chlamydia or gonorrhea. Ask your health care provider if you are at risk.  If you do not have HIV, but are at risk, it may be recommended that you take a prescription medicine daily to prevent HIV infection. This is called pre-exposure prophylaxis (PrEP). You are considered at risk if: ? You are sexually active and do not regularly use condoms or know the HIV status of your partner(s). ? You take drugs by injection. ? You are sexually active with a partner who has HIV.  Talk with your health care provider about whether you are at high risk of being infected with HIV. If you choose to begin PrEP, you should first be tested for HIV. You should then be tested every 3 months for as long as you are taking PrEP. Pregnancy  If you are premenopausal and you may become pregnant, ask your health care provider about preconception counseling.  If you may become pregnant, take 400 to 800 micrograms (mcg) of folic acid every day.  If you want to prevent pregnancy, talk to your health care provider about birth control (contraception). Osteoporosis and menopause  Osteoporosis is a disease in which the bones lose minerals and strength with aging. This can result in serious bone fractures. Your risk for osteoporosis can be identified using a bone density scan.  If you are 64 years  of age or older, or if you are at risk for osteoporosis and fractures, ask your health care provider if you should be screened.  Ask your health care provider whether you should take a calcium or vitamin D supplement to lower your risk for osteoporosis.  Menopause may have certain physical symptoms and risks.  Hormone replacement therapy may reduce some of these symptoms and risks. Talk to your health care provider about whether hormone replacement therapy is right for you. Follow these instructions at home:  Schedule regular health, dental, and eye exams.  Stay current with your immunizations.  Do not use any tobacco products including cigarettes, chewing tobacco, or electronic cigarettes.  If you are pregnant, do not drink alcohol.  If you are breastfeeding, limit how much and how often you drink alcohol.  Limit alcohol intake to no more than 1 drink per day for nonpregnant women. One drink equals 12 ounces of beer, 5 ounces of Jamarien Rodkey, or 1 ounces of hard liquor.  Do not use street drugs.  Do not share needles.  Ask your health care provider for help if you need support or information about quitting drugs.  Tell your health care provider if you often feel depressed.  Tell your health care provider if you have ever been abused or do not feel safe at home. This information is not intended to replace advice given to you by your health care provider. Make sure you discuss any questions you have with your health care provider. Document Released: 09/30/2010 Document Revised: 08/23/2015 Document Reviewed: 12/19/2014 Elsevier Interactive Patient Education  [employer].

## 2018-01-28 ENCOUNTER — Other Ambulatory Visit: Payer: Self-pay | Admitting: Internal Medicine

## 2018-02-02 ENCOUNTER — Ambulatory Visit: Payer: Medicare Other

## 2018-02-02 ENCOUNTER — Ambulatory Visit (INDEPENDENT_AMBULATORY_CARE_PROVIDER_SITE_OTHER): Payer: Medicare Other | Admitting: General Practice

## 2018-02-02 DIAGNOSIS — Z86718 Personal history of other venous thrombosis and embolism: Secondary | ICD-10-CM

## 2018-02-02 DIAGNOSIS — Z7901 Long term (current) use of anticoagulants: Secondary | ICD-10-CM

## 2018-02-02 LAB — POCT INR: INR: 1.7 — AB (ref 2.0–3.0)

## 2018-02-02 NOTE — Patient Instructions (Addendum)
Pre visit review using our clinic review tool, if applicable. No additional management support is needed unless otherwise documented below in the visit note.  Take 5 mg today (11/5) and then continue to take 2.5 mg daily except 5 mg on Monday/Fridays.  Re-check in 4 weeks.

## 2018-02-02 NOTE — Progress Notes (Signed)
Agree with management.  Racer Quam J Eshaan Titzer, MD  

## 2018-02-15 ENCOUNTER — Other Ambulatory Visit: Payer: Self-pay | Admitting: Internal Medicine

## 2018-02-15 NOTE — Progress Notes (Signed)
Subjective:    Patient ID: Bonnie Zhang, female    DOB: 30-Jul-1940, 77 y.o.   MRN: [id number]  HPI She is here for an acute visit for cold symptoms.  Her symptoms started about 2 weeks ago.    She is experiencing sore throat initially which went away.  She has had a cough since then that is productive.  She has seen discolored sputum.  She has nasal congestion, sore throat, hoarseness.  She denies fever and chills.  She denies any obvious shortness of breath, wheezing and headaches.  She denies body aches.  She was concerned that her symptoms are not improving after the 2 weeks and her cough seems to be getting worse.  She has taken cough and cold medication.    Medications and allergies reviewed with patient and updated if appropriate.  Patient Active Problem List   Diagnosis Date Noted  . Hyperglycemia 06/29/2017  . Vertigo 04/07/2017  . Long term (current) use of anticoagulants 01/07/2017  . Carotid artery disease ([place]) 02/01/2016  . Bilateral leg edema 06/21/2015  . Vitamin B12 deficiency (non anemic) 01/24/2015  . 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 Primary localized osteoarthritis of left knee   . Encounter for therapeutic drug monitoring 06/07/2013  . GERD (gastroesophageal reflux disease)   . PVD (peripheral vascular disease) ([place])   . 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 10/09/2010  . Osteoporosis 03/21/2010  . History of colonic polyps 03/20/2008  . Vitamin D deficiency 08/25/2007  . HYPERLIPIDEMIA 03/18/2007  . Dysmetabolic syndrome X 91/79/1505  . Essential hypertension 03/18/2007  . DVT, HX OF 07/23/2006    Current Outpatient Medications on File Prior to Visit  Medication Sig Dispense Refill  . amLODipine (NORVASC) 5 MG tablet TAKE 1 TABLET BY MOUTH ONCE DAILY 90 tablet 1  . benazepril (LOTENSIN) 20 MG tablet TAKE 1 TABLET BY MOUTH ONCE DAILY 90 tablet 1  . metoprolol  succinate (TOPROL-XL) 50 MG 24 hr tablet TAKE 1 TABLET BY MOUTH ONCE DAILY 90 tablet 3  . pantoprazole (PROTONIX) 40 MG tablet TAKE ONE TABLET BY MOUTH ONCE DAILY AS NEEDED FOR  ACID  REFLUX 90 tablet 1  . simvastatin (ZOCOR) 10 MG tablet TAKE 1 TABLET BY MOUTH AT BEDTIME 90 tablet 2  . warfarin (COUMADIN) 5 MG tablet TAKE AS DIRECTED BY ANTICOAGULATION CLINIC 90 tablet 1   No current facility-administered medications on file prior to visit.     Past Medical History:  Diagnosis Date  . Anticoagulated on Coumadin 2001   2 blood clots  . Bruises easily    pt is on Coumadin;last dose of Coumadin 08/05/11 and then Lovenox started 08/07/11  . DVT (deep vein thrombosis) in pregnancy     1965 post C section;2001 with prolonged driving while on  HRT  . DVT (deep venous thrombosis) ([place]) 2001   Was on Prempro.    Marland Kitchen GERD (gastroesophageal reflux disease)    Protonix prn  . High cholesterol    takes Simvastating daily  . History of colon polyps 2007   Dr Sharlett Iles  . 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 Hx of colonic polyps    Dr Sharlett Iles  . Hx of migraines    yrs ago   . Hypertension    takes Benazepril,Amlodipine,and Metoprolol daily  . Osteoporosis   . PONV (postoperative nausea and vomiting)   . Primary localized osteoarthritis of left knee   . 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 Right knee DJD    knees  . Shortness of breath dyspnea   . Vitamin B12 deficiency (non anemic) 01/24/2015  . Vitamin D deficiency    takes VIt D daily    Past Surgical History:  Procedure Laterality Date  . ABDOMINAL HYSTERECTOMY    . APPENDECTOMY  1965  . BREAST LUMPECTOMY  1987  . CESAREAN SECTION  1961/1965/1966   X 3  . CHOLECYSTECTOMY  1997  . COLONOSCOPY W/ POLYPECTOMY  2007   Dr  Sharlett Iles; due? 2012  . DILATION AND CURETTAGE OF UTERUS  2012   uterine polyp, Dr Kennon Rounds  . ESOPHAGOGASTRODUODENOSCOPY  1997  .  HYSTEROSCOPY W/D&C N/A 08/05/2012   Procedure: DILATATION AND CURETTAGE /HYSTEROSCOPY;  Surgeon: Donnamae Jude, MD;  Location: [facility];  Service: Gynecology;  Laterality: N/A;  . ROBOTIC ASSISTED TOTAL HYSTERECTOMY WITH BILATERAL SALPINGO OOPHERECTOMY Right 09/14/2012   Procedure: ROBOTIC ASSISTED TOTAL HYSTERECTOMY WITH BILATERAL SALPINGO OOPHORECTOMY ,right pelvic LYMPHNODE dissection;  Surgeon: Imagene Gurney A. Alycia Rossetti, MD;  Location: [facility];  Service: Gynecology;  Laterality: Right;  . TOTAL KNEE ARTHROPLASTY  08/11/2011   Procedure: TOTAL KNEE ARTHROPLASTY;  Surgeon: Lorn Junes, MD;  Location: [facility];  Service: Orthopedics;  Laterality: Right;  DR Epes THIS CASE  . TOTAL KNEE ARTHROPLASTY Left 09/11/2014  . TOTAL KNEE ARTHROPLASTY Left 09/11/2014   Procedure: TOTAL KNEE ARTHROPLASTY;  Surgeon: Elsie Saas, MD;  Location: [facility];  Service: Orthopedics;  Laterality: Left;  . WISDOM TOOTH EXTRACTION      Social History   Socioeconomic History  . Marital status: Divorced    Spouse name: Not on file  . Number of children: 1  . Years of education: Not on file  . Highest education level: Not on file  Occupational History  . Not on file  Social Needs  . Financial resource strain: Not hard at all  . Food insecurity:    Worry: Never true    Inability: Never true  . Transportation needs:    Medical: No    Non-medical: No  Tobacco Use  . Smoking status: Never Smoker  . Smokeless tobacco: Never Used  Substance and Sexual Activity  . Alcohol use: No  . Drug use: No  . Sexual activity: Never    Birth control/protection: Post-menopausal    Comment: retired. has 2 daughters  Lifestyle  . Physical activity:    Days per week: 0 days    Minutes per session: 0 min  . Stress: Not at all  Relationships  . Social connections:    Talks on phone: More than three times a week    Gets together: More than three times a week    Attends religious service: Never    Active member of  club or organization: Yes    Attends meetings of clubs or organizations: More than 4 times per year    Relationship status: Divorced  Other Topics Concern  . Not on file  Social History Narrative   No regular exercise    Family History  Problem Relation Age of Onset  . Kidney disease Mother        ? hypertensive  . Hypertension Mother   . Deep vein thrombosis Mother        post ankle fracture  . Leukemia Father   . Colon cancer Maternal Grandmother   . Diabetes Other  cousins  . Anesthesia problems Neg Hx   . Hypotension Neg Hx   . Malignant hyperthermia Neg Hx   . Pseudochol deficiency Neg Hx   . Stroke Neg Hx   . Heart disease Neg Hx     Review of Systems  Constitutional: Negative for chills and fever.  HENT: Positive for congestion, sore throat and voice change. Negative for ear pain and sinus pain.   Respiratory: Positive for cough (productive). Negative for shortness of breath and wheezing.   Gastrointestinal: Negative for diarrhea and nausea.  Musculoskeletal: Negative for myalgias.  Neurological: Negative for light-headedness and headaches.       Objective:   Vitals:   02/16/18 1423  BP: 130/64  Pulse: 64  Resp: 16  Temp: 98.3 F (36.8 C)  SpO2: 97%   Filed Weights   02/16/18 1423  Weight: 176 lb (79.8 kg)   Body mass index is 34.37 kg/m.  Wt Readings from Last 3 Encounters:  02/16/18 176 lb (79.8 kg)  01/07/18 179 lb (81.2 kg)  12/29/17 177 lb (80.3 kg)     Physical Exam GENERAL APPEARANCE: Appears stated age, well appearing, NAD EYES: conjunctiva clear, no icterus HEENT: bilateral tympanic membranes and ear canals normal, oropharynx with mild erythema, no thyromegaly, trachea midline, no cervical or supraclavicular lymphadenopathy LUNGS: unlabored breathing, good air entry bilaterally, trace expiratory wheeze on occasion, no crackles CARDIOVASCULAR: Normal S1,S2 without murmurs, no edema SKIN: warm, dry        Assessment & Plan:    See Problem List for Assessment and Plan of chronic medical problems.

## 2018-02-16 ENCOUNTER — Encounter: Payer: Self-pay | Admitting: Internal Medicine

## 2018-02-16 ENCOUNTER — Ambulatory Visit (INDEPENDENT_AMBULATORY_CARE_PROVIDER_SITE_OTHER): Payer: Medicare Other | Admitting: Internal Medicine

## 2018-02-16 VITALS — BP 130/64 | HR 64 | Temp 98.3°F | Resp 16 | Ht 60.0 in | Wt 176.0 lb

## 2018-02-16 DIAGNOSIS — J22 Unspecified acute lower respiratory infection: Secondary | ICD-10-CM | POA: Diagnosis not present

## 2018-02-16 MED ORDER — AMOXICILLIN-POT CLAVULANATE 875-125 MG PO TABS: 1.0000 | ORAL_TABLET | Freq: Two times a day (BID) | ORAL | 0 refills | Status: DC

## 2018-02-16 NOTE — Patient Instructions (Signed)
° ° °  Take the antibiotic as prescribed - complete the entire course.   ° ° °Continue over the counter cold medication, advil and tylenol.  Increase your fluids and rest.  ° ° °Call if no improvement   ° °

## 2018-02-16 NOTE — Assessment & Plan Note (Signed)
Worsening cough after 2 weeks-coughing up discolored sputum, mild wheeze at times Concern for bacterial process We will start Augmentin twice daily x10 days Over-the-counter cold medications as needed Increase rest and fluids Call if no improvement

## 2018-03-02 ENCOUNTER — Ambulatory Visit: Payer: Medicare Other

## 2018-03-05 ENCOUNTER — Ambulatory Visit (INDEPENDENT_AMBULATORY_CARE_PROVIDER_SITE_OTHER): Payer: Medicare Other | Admitting: General Practice

## 2018-03-05 DIAGNOSIS — Z7901 Long term (current) use of anticoagulants: Secondary | ICD-10-CM

## 2018-03-05 DIAGNOSIS — Z86718 Personal history of other venous thrombosis and embolism: Secondary | ICD-10-CM

## 2018-03-05 LAB — POCT INR: INR: 2.1 (ref 2.0–3.0)

## 2018-03-05 NOTE — Patient Instructions (Addendum)
Pre visit review using our clinic review tool, if applicable. No additional management support is needed unless otherwise documented below in the visit note.  Continue to take 2.5 mg daily except 5 mg on Monday/Fridays.  Re-check in 4 weeks.

## 2018-03-05 NOTE — Progress Notes (Signed)
Agree with management.  Bonnie Zhang Bonnie Naseem Varden, MD  

## 2018-03-31 DIAGNOSIS — G459 Transient cerebral ischemic attack, unspecified: Secondary | ICD-10-CM

## 2018-03-31 HISTORY — DX: Transient cerebral ischemic attack, unspecified: G45.9

## 2018-04-02 ENCOUNTER — Ambulatory Visit (INDEPENDENT_AMBULATORY_CARE_PROVIDER_SITE_OTHER): Payer: Medicare Other | Admitting: General Practice

## 2018-04-02 DIAGNOSIS — Z7901 Long term (current) use of anticoagulants: Secondary | ICD-10-CM

## 2018-04-02 DIAGNOSIS — Z86718 Personal history of other venous thrombosis and embolism: Secondary | ICD-10-CM

## 2018-04-02 LAB — POCT INR: INR: 2.5 (ref 2.0–3.0)

## 2018-04-02 NOTE — Patient Instructions (Addendum)
Pre visit review using our clinic review tool, if applicable. No additional management support is needed unless otherwise documented below in the visit note.  Continue to take 2.5 mg daily except 5 mg on Monday/Fridays.  Re-check in 4 weeks.

## 2018-04-02 NOTE — Progress Notes (Signed)
Agree with management.  Eben Choinski J Deadrian Toya, MD  

## 2018-04-27 ENCOUNTER — Other Ambulatory Visit: Payer: Self-pay | Admitting: Internal Medicine

## 2018-04-28 ENCOUNTER — Other Ambulatory Visit: Payer: Self-pay | Admitting: Internal Medicine

## 2018-04-30 ENCOUNTER — Ambulatory Visit (INDEPENDENT_AMBULATORY_CARE_PROVIDER_SITE_OTHER): Payer: Medicare Other | Admitting: General Practice

## 2018-04-30 DIAGNOSIS — Z7901 Long term (current) use of anticoagulants: Secondary | ICD-10-CM | POA: Diagnosis not present

## 2018-04-30 DIAGNOSIS — Z86718 Personal history of other venous thrombosis and embolism: Secondary | ICD-10-CM

## 2018-04-30 LAB — POCT INR: INR: 2.4 (ref 2.0–3.0)

## 2018-04-30 NOTE — Progress Notes (Signed)
Agree with management.  Genie Mirabal J Cayden Rautio, MD  

## 2018-04-30 NOTE — Patient Instructions (Addendum)
Pre visit review using our clinic review tool, if applicable. No additional management support is needed unless otherwise documented below in the visit note.  Continue to take 2.5 mg daily except 5 mg on Monday/Fridays.  Re-check in 4 weeks.

## 2018-05-14 ENCOUNTER — Encounter (HOSPITAL_BASED_OUTPATIENT_CLINIC_OR_DEPARTMENT_OTHER): Payer: Self-pay | Admitting: Adult Health

## 2018-05-14 ENCOUNTER — Other Ambulatory Visit: Payer: Self-pay

## 2018-05-14 ENCOUNTER — Emergency Department (HOSPITAL_BASED_OUTPATIENT_CLINIC_OR_DEPARTMENT_OTHER): Payer: Medicare Other

## 2018-05-14 ENCOUNTER — Emergency Department (HOSPITAL_BASED_OUTPATIENT_CLINIC_OR_DEPARTMENT_OTHER)
Admission: EM | Admit: 2018-05-14 | Discharge: 2018-05-14 | Disposition: A | Discharge status: Home / Self Care | Payer: Medicare Other | Attending: Emergency Medicine | Admitting: Emergency Medicine

## 2018-05-14 ENCOUNTER — Ambulatory Visit: Payer: Self-pay | Admitting: *Deleted

## 2018-05-14 DIAGNOSIS — R51 Headache: Secondary | ICD-10-CM | POA: Diagnosis not present

## 2018-05-14 DIAGNOSIS — I10 Essential (primary) hypertension: Secondary | ICD-10-CM | POA: Insufficient documentation

## 2018-05-14 DIAGNOSIS — Z79899 Other long term (current) drug therapy: Secondary | ICD-10-CM | POA: Diagnosis not present

## 2018-05-14 DIAGNOSIS — Z96652 Presence of left artificial knee joint: Secondary | ICD-10-CM | POA: Diagnosis not present

## 2018-05-14 DIAGNOSIS — R42 Dizziness and giddiness: Secondary | ICD-10-CM | POA: Diagnosis not present

## 2018-05-14 DIAGNOSIS — Z7901 Long term (current) use of anticoagulants: Secondary | ICD-10-CM | POA: Diagnosis not present

## 2018-05-14 LAB — URINALYSIS, ROUTINE W REFLEX MICROSCOPIC
Bilirubin Urine: NEGATIVE
Glucose, UA: NEGATIVE mg/dL
Ketones, ur: NEGATIVE mg/dL
Nitrite: NEGATIVE
Protein, ur: NEGATIVE mg/dL
Specific Gravity, Urine: <1.005 — ABNORMAL LOW (ref 1.005–1.030)
pH: 6.5 (ref 5.0–8.0)

## 2018-05-14 LAB — CBC WITH DIFFERENTIAL/PLATELET
Abs Immature Granulocytes: 0.02 10*3/uL (ref 0.00–0.07)
Basophils Absolute: 0 10*3/uL (ref 0.0–0.1)
Basophils Relative: 0 %
Eosinophils Absolute: 0.1 10*3/uL (ref 0.0–0.5)
Eosinophils Relative: 1 %
HCT: 46.5 % — ABNORMAL HIGH (ref 36.0–46.0)
Hemoglobin: 14.7 g/dL (ref 12.0–15.0)
Immature Granulocytes: 0 %
Lymphocytes Relative: 39 %
Lymphs Abs: 2.1 10*3/uL (ref 0.7–4.0)
MCH: 28.2 pg (ref 26.0–34.0)
MCHC: 31.6 g/dL (ref 30.0–36.0)
MCV: 89.3 fL (ref 80.0–100.0)
Monocytes Absolute: 0.4 10*3/uL (ref 0.1–1.0)
Monocytes Relative: 7 %
Neutro Abs: 2.9 10*3/uL (ref 1.7–7.7)
Neutrophils Relative %: 53 %
Platelets: 174 10*3/uL (ref 150–400)
RBC: 5.21 MIL/uL — ABNORMAL HIGH (ref 3.87–5.11)
RDW: 12.7 % (ref 11.5–15.5)
WBC: 5.5 10*3/uL (ref 4.0–10.5)
nRBC: 0 % (ref 0.0–0.2)

## 2018-05-14 LAB — PROTIME-INR
INR: 2.44
Prothrombin Time: 26.1 seconds — ABNORMAL HIGH (ref 11.4–15.2)

## 2018-05-14 LAB — BASIC METABOLIC PANEL
Anion gap: 10 (ref 5–15)
BUN: 10 mg/dL (ref 8–23)
CO2: 24 mmol/L (ref 22–32)
Calcium: 9.1 mg/dL (ref 8.9–10.3)
Chloride: 104 mmol/L (ref 98–111)
Creatinine, Ser: 0.97 mg/dL (ref 0.44–1.00)
GFR calc Af Amer: >60 mL/min (ref >60)
GFR calc non Af Amer: 57 mL/min — ABNORMAL LOW (ref >60)
Glucose, Bld: 140 mg/dL — ABNORMAL HIGH (ref 70–99)
Potassium: 3.7 mmol/L (ref 3.5–5.1)
Sodium: 138 mmol/L (ref 135–145)

## 2018-05-14 LAB — URINALYSIS, MICROSCOPIC (REFLEX)

## 2018-05-14 LAB — TROPONIN I: Troponin I: <0.03 ng/mL (ref <0.03)

## 2018-05-14 MED ORDER — MECLIZINE HCL 25 MG PO TABS: 25.0000 mg | ORAL_TABLET | Freq: Three times a day (TID) | ORAL | 0 refills | Status: DC | PRN

## 2018-05-14 MED ORDER — MECLIZINE HCL 25 MG PO TABS
25.0000 mg | ORAL_TABLET | Freq: Once | ORAL | Status: AC
  Administered 2018-05-14: 25 mg via ORAL
  Filled 2018-05-14: qty 1

## 2018-05-14 NOTE — Telephone Encounter (Signed)
Pt reports intermittent lightheadedness, nausea, CP, onset yesterday. Reports mild nausea, no vomiting. States CP is mid chest, radiates to left, "Comes and goes, dull" present now. Also reports "Mild headache, chills at times." States H/O reflux and took protonix "Which usually helps",  ineffective. Pt reports BP this am 137/73. Unsure of heart rate and unable to check during call. H/O carotid artery disease, HTN, DVT, on coumadin.  Pt directed to ED. States will follow disposition, granddaughter will drive.    Reason for Disposition . [1] MODERATE dizziness (e.g., interferes with normal activities) AND [2] has NOT been evaluated by physician for this  (Exception: dizziness caused by heat exposure, sudden standing, or poor fluid intake) . Dizziness or lightheadedness  Answer Assessment - Initial Assessment Questions 1. DESCRIPTION: "Describe your dizziness."     Lightheadedness 2. LIGHTHEADED: "Do you feel lightheaded?" (e.g., somewhat faint, woozy, weak upon standing)     Lightheaded 3. VERTIGO: "Do you feel like either you or the room is spinning or tilting?" (i.e. vertigo)     no 4. SEVERITY: "How bad is it?"  "Do you feel like you are going to faint?" "Can you stand and walk?"   - MILD - walking normally   - MODERATE - interferes with normal activities (e.g., work, school)    - SEVERE - unable to stand, requires support to walk, feels like passing out now.      mild 5. ONSET:  "When did the dizziness begin?"     Yesterday 6. AGGRAVATING FACTORS: "Does anything make it worse?" (e.g., standing, change in head position)     No 7. HEART RATE: "Can you tell me your heart rate?" "How many beats in 15 seconds?"  (Note: not all patients can do this)       Can not do this 8. CAUSE: "What do you think is causing the dizziness?"     Unsure 9. RECURRENT SYMPTOM: "Have you had dizziness before?" If so, ask: "When was the last time?" "What happened that time?"     Yes, years ago "Inner ear" 10.  OTHER SYMPTOMS: "Do you have any other symptoms?" (e.g., fever, chest pain, vomiting, diarrhea, bleeding)      Reflux, Chills, sweats, "Dull headache"  Answer Assessment - Initial Assessment Questions 1. LOCATION: "Where does it hurt?"       Mid chest radiates to left  2. RADIATION: "Does the pain go anywhere else?" (e.g., into neck, jaw, arms, back)     No 3. ONSET: "When did the chest pain begin?" (Minutes, hours or days)      Yesterday 4. PATTERN "Does the pain come and go, or has it been constant since it started?"  "Does it get worse with exertion?"      Comes and goes 5. DURATION: "How long does it last" (e.g., seconds, minutes, hours)      6. SEVERITY: "How bad is the pain?"  (e.g., Scale 1-10; mild, moderate, or severe)    - MILD (1-3): doesn't interfere with normal activities     - MODERATE (4-7): interferes with normal activities or awakens from sleep    - SEVERE (8-10): excruciating pain, unable to do any normal activities       mild 7. CARDIAC RISK FACTORS: "Do you have any history of heart problems or risk factors for heart disease?" (e.g., prior heart attack, angina; high blood pressure, diabetes, being overweight, high cholesterol, smoking, or strong family history of heart disease)     Carotid artery disease, HTN, DVT,  on coumadin 8. PULMONARY RISK FACTORS: "Do you have any history of lung disease?"  (e.g., blood clots in lung, asthma, emphysema, birth control pills)    no 9. CAUSE: "What do you think is causing the chest pain?"     Unsure 10. OTHER SYMPTOMS: "Do you have any other symptoms?" (e.g., dizziness, nausea, vomiting, sweating, fever, difficulty breathing, cough)      Intermittent lightheadedness, nausea, chills alternating with " feeling hot."  Protocols used: DIZZINESS - LIGHTHEADEDNESS-A-AH, CHEST PAIN-A-AH

## 2018-05-14 NOTE — ED Triage Notes (Signed)
Presents with intermittent that begins usually when walking and has been going on since yesterday. She endorses nausea. She states that nothing makes the dizziness worse and nothing makes it better. The dizziness lasts for a few minutes and then goes away. She states it feels like she is spinning. She abulated to the room. Endorses headache. Denies numbness and tingling, weakness, slurred speech and blurred vision.

## 2018-05-14 NOTE — ED Provider Notes (Signed)
[facility] Provider Note   CSN: [id number] Arrival date & time: 05/14/18  1155     History   Chief Complaint Chief Complaint  Patient presents with  . Dizziness    HPI Bonnie Zhang is a 78 y.o. female.  Patient is a 78 year old female with a history of hypertension, hyperlipidemia, GERD, prior DVT on Coumadin who presents with dizziness.  She reports a 2-day history of intermittent dizziness.  She describes that as in her ear dizziness which gets worse when she is walking.  Is not really worse with head position.  She denies any nausea or vomiting.  She says it last for few minutes and then goes away.  She currently denies any dizziness.  She has had some intermittent very dull mild headaches.  She says it comes and goes as well.  Its more in the frontal area.  No posterior headache.  No neck pain.  No fevers.  No recent head injury.  She did have yesterday a little indigestion type discomfort in the center of her chest which she has had previously with GERD.  She denies any current chest pain.  No shortness of breath.  No leg pain or swelling.  She does have a prior history of vertigo several years ago which feels similar.  She does not have any trouble with her balance.  No vision changes.  No speech deficits.  No numbness or weakness to her extremities.     Past Medical History:  Diagnosis Date  . Anticoagulated on Coumadin 2001   2 blood clots  . Bruises easily    pt is on Coumadin;last dose of Coumadin 08/05/11 and then Lovenox started 08/07/11  . DVT (deep vein thrombosis) in pregnancy     1965 post C section;2001 with prolonged driving while on  HRT  . DVT (deep venous thrombosis) ([place]) 2001   Was on Prempro.    Marland Kitchen GERD (gastroesophageal reflux disease)    Protonix prn  . High cholesterol    takes Simvastating daily  . History of colon polyps 2007   Dr Sharlett Iles  . 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 Hx of colonic polyps    Dr  Sharlett Iles  . Hx of migraines    yrs ago   . Hypertension    takes Benazepril,Amlodipine,and Metoprolol daily  . Osteoporosis   . PONV (postoperative nausea and vomiting)   . Primary localized osteoarthritis of left knee   . PVD (peripheral vascular disease) ([place]) 1965, 2001   abnormal venous doppler findings due to recurrent DVT'S left leg  . Right knee DJD    knees  . Shortness of breath dyspnea   . Vitamin B12 deficiency (non anemic) 01/24/2015  . Vitamin D deficiency    takes VIt D daily    Patient Active Problem List   Diagnosis Date Noted  . LRTI (lower respiratory tract infection) 02/16/2018  . Hyperglycemia 06/29/2017  . Vertigo 04/07/2017  . Long term (current) use of anticoagulants 01/07/2017  . Carotid artery disease ([place]) 02/01/2016  . Bilateral leg edema 06/21/2015  . Vitamin B12 deficiency (non anemic) 01/24/2015  . 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 Primary localized osteoarthritis of left knee   . Encounter for therapeutic drug monitoring 06/07/2013  . GERD (gastroesophageal reflux disease)   . PVD (peripheral vascular disease) ([place])   . 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 10/09/2010  . Osteoporosis 03/21/2010  . History of  colonic polyps 03/20/2008  . Vitamin D deficiency 08/25/2007  . HYPERLIPIDEMIA 03/18/2007  . Dysmetabolic syndrome X 73/53/2992  . Essential hypertension 03/18/2007  . DVT, HX OF 07/23/2006    Past Surgical History:  Procedure Laterality Date  . ABDOMINAL HYSTERECTOMY    . APPENDECTOMY  1965  . BREAST LUMPECTOMY  1987  . CESAREAN SECTION  1961/1965/1966   X 3  . CHOLECYSTECTOMY  1997  . COLONOSCOPY W/ POLYPECTOMY  2007   Dr  Sharlett Iles; due? 2012  . DILATION AND CURETTAGE OF UTERUS  2012   uterine polyp, Dr Kennon Rounds  . ESOPHAGOGASTRODUODENOSCOPY  1997  . HYSTEROSCOPY W/D&C N/A 08/05/2012   Procedure: DILATATION AND CURETTAGE /HYSTEROSCOPY;   Surgeon: Donnamae Jude, MD;  Location: [facility];  Service: Gynecology;  Laterality: N/A;  . ROBOTIC ASSISTED TOTAL HYSTERECTOMY WITH BILATERAL SALPINGO OOPHERECTOMY Right 09/14/2012   Procedure: ROBOTIC ASSISTED TOTAL HYSTERECTOMY WITH BILATERAL SALPINGO OOPHORECTOMY ,right pelvic LYMPHNODE dissection;  Surgeon: Imagene Gurney A. Alycia Rossetti, MD;  Location: [facility];  Service: Gynecology;  Laterality: Right;  . TOTAL KNEE ARTHROPLASTY  08/11/2011   Procedure: TOTAL KNEE ARTHROPLASTY;  Surgeon: Lorn Junes, MD;  Location: [facility];  Service: Orthopedics;  Laterality: Right;  DR [place] THIS CASE  . TOTAL KNEE ARTHROPLASTY Left 09/11/2014  . TOTAL KNEE ARTHROPLASTY Left 09/11/2014   Procedure: TOTAL KNEE ARTHROPLASTY;  Surgeon: Elsie Saas, MD;  Location: [facility];  Service: Orthopedics;  Laterality: Left;  . WISDOM TOOTH EXTRACTION       OB History   No obstetric history on file.      Home Medications    Prior to Admission medications   Medication Sig Start Date End Date Taking? Authorizing Provider  amLODipine (NORVASC) 5 MG tablet TAKE 1 TABLET BY MOUTH ONCE DAILY 02/15/18   [name], MD  amoxicillin-clavulanate (AUGMENTIN) 875-125 MG tablet Take 1 tablet by mouth 2 (two) times daily. 02/16/18   [name], MD  benazepril (LOTENSIN) 20 MG tablet TAKE 1 TABLET BY MOUTH ONCE DAILY 01/29/18   [name], MD  meclizine (ANTIVERT) 25 MG tablet Take 1 tablet (25 mg total) by mouth 3 (three) times daily as needed for dizziness. 05/14/18   Malvin Johns, MD  metoprolol succinate (TOPROL-XL) 50 MG 24 hr tablet TAKE 1 TABLET BY MOUTH ONCE DAILY 06/30/17   Burns, Claudina Lick, MD  pantoprazole (PROTONIX) 40 MG tablet TAKE 1 TABLET BY MOUTH ONCE DAILY AS NEEDED FOR  ACID  REFLUX 04/27/18   [name], MD  simvastatin (ZOCOR) 10 MG tablet Take 1 tablet (10 mg total) by mouth at bedtime. Annual appt due April must see provider for future refills 04/29/18   [name], MD  warfarin  (COUMADIN) 5 MG tablet TAKE AS DIRECTED BY ANTICOAGULATION CLINIC 12/31/17   [name], MD    Family History Family History  Problem Relation Age of Onset  . Kidney disease Mother        ? hypertensive  . Hypertension Mother   . Deep vein thrombosis Mother        post ankle fracture  . Leukemia Father   . Colon cancer Maternal Grandmother   . Diabetes Other        cousins  . Anesthesia problems Neg Hx   . Hypotension Neg Hx   . Malignant hyperthermia Neg Hx   . Pseudochol deficiency Neg Hx   . Stroke Neg Hx   . Heart disease Neg  Hx     Social History Social History   Tobacco Use  . Smoking status: Never Smoker  . Smokeless tobacco: Never Used  Substance Use Topics  . Alcohol use: No  . Drug use: No     Allergies   Vicodin [hydrocodone-acetaminophen]; Aspirin; Atorvastatin; and Hctz [hydrochlorothiazide]   Review of Systems Review of Systems  Constitutional: Negative for chills, diaphoresis, fatigue and fever.  HENT: Negative for congestion, rhinorrhea and sneezing.   Eyes: Negative.   Respiratory: Negative for cough, chest tightness and shortness of breath.   Cardiovascular: Positive for chest pain. Negative for leg swelling.  Gastrointestinal: Negative for abdominal pain, blood in stool, diarrhea, nausea and vomiting.  Genitourinary: Negative for difficulty urinating, flank pain, frequency and hematuria.  Musculoskeletal: Negative for arthralgias and back pain.  Skin: Negative for rash.  Neurological: Positive for dizziness and headaches. Negative for speech difficulty, weakness and numbness.     Physical Exam Updated Vital Signs BP (!) 165/92 (BP Location: Right Arm)   Pulse 73   Temp (!) 97.4 F (36.3 C) (Oral)   Resp 20   SpO2 99%   Physical Exam Constitutional:      Appearance: She is well-developed.  HENT:     Head: Normocephalic and atraumatic.  Eyes:     Pupils: Pupils are equal, round, and reactive to light.     Comments: Patient has  slight horizontal nystagmus on leftward gaze.  No vertical or rotational nystagmus  Neck:     Musculoskeletal: Normal range of motion and neck supple.  Cardiovascular:     Rate and Rhythm: Normal rate and regular rhythm.     Heart sounds: Normal heart sounds.  Pulmonary:     Effort: Pulmonary effort is normal. No respiratory distress.     Breath sounds: Normal breath sounds. No wheezing or rales.  Chest:     Chest wall: No tenderness.  Abdominal:     General: Bowel sounds are normal.     Palpations: Abdomen is soft.     Tenderness: There is no abdominal tenderness. There is no guarding or rebound.  Musculoskeletal: Normal range of motion.  Lymphadenopathy:     Cervical: No cervical adenopathy.  Skin:    General: Skin is warm and dry.     Findings: No rash.  Neurological:     Mental Status: She is alert and oriented to person, place, and time.     Comments: Motor 5/5 all extremities Sensation grossly intact to LT all extremities Finger to Nose intact, no pronator drift CN II-XII grossly intact Gait normal Visual fields full to confrontation      ED Treatments / Results  Labs (all labs ordered are listed, but only abnormal results are displayed) Labs Reviewed  BASIC METABOLIC PANEL - Abnormal; Notable for the following components:      Result Value   Glucose, Bld 140 (*)    GFR calc non Af Amer 57 (*)    All other components within normal limits  CBC WITH DIFFERENTIAL/PLATELET - Abnormal; Notable for the following components:   RBC 5.21 (*)    HCT 46.5 (*)    All other components within normal limits  URINALYSIS, ROUTINE W REFLEX MICROSCOPIC - Abnormal; Notable for the following components:   Specific Gravity, Urine <1.005 (*)    Hgb urine dipstick TRACE (*)    Leukocytes,Ua TRACE (*)    All other components within normal limits  PROTIME-INR - Abnormal; Notable for the following components:   Prothrombin  Time 26.1 (*)    All other components within normal limits    URINALYSIS, MICROSCOPIC (REFLEX) - Abnormal; Notable for the following components:   Bacteria, UA RARE (*)    All other components within normal limits  TROPONIN I    EKG EKG Interpretation  Date/Time:  Friday May 14 2018 12:04:11 EST Ventricular Rate:  70 PR Interval:    QRS Duration: 152 QT Interval:  458 QTC Calculation: 495 R Axis:   -46 Text Interpretation:  Sinus rhythm Left bundle branch block new compared to EKG from 2015 Confirmed by Malvin Johns [phone]) on 05/14/2018 12:27:04 PM Also confirmed by Malvin Johns [phone]), editor Hattie Perch ([id number])  on 05/14/2018 2:04:04 PM   Radiology Ct Head Wo Contrast  Result Date: 05/14/2018 CLINICAL DATA:  Headache. EXAM: CT HEAD WITHOUT CONTRAST TECHNIQUE: Contiguous axial images were obtained from the base of the skull through the vertex without intravenous contrast. COMPARISON:  None. FINDINGS: Brain: Minimal chronic ischemic white matter disease. No mass effect or midline shift is noted. Ventricular size is within normal limits. There is no evidence of mass lesion, hemorrhage or acute infarction. Vascular: No hyperdense vessel or unexpected calcification. Skull: Normal. Negative for fracture or focal lesion. Sinuses/Orbits: No acute finding. Other: None. IMPRESSION: Minimal chronic ischemic white matter disease. No acute intracranial abnormality seen. Electronically Signed   By: Marijo Conception, M.D.   On: 05/14/2018 13:22    Procedures Procedures (including critical care time)  Medications Ordered in ED Medications  meclizine (ANTIVERT) tablet 25 mg (25 mg Oral Given 05/14/18 1302)     Initial Impression / Assessment and Plan / ED Course  I have reviewed the triage vital signs and the nursing notes.  Pertinent labs & imaging results that were available during my care of the patient were reviewed by me and considered in my medical decision making (see chart for details).    Patient is a 78 year old female who  presents with intermittent dizziness.  She also has some intermittent headaches but denies any currently.  She has no neurologic deficits.  No ataxia.  She is currently asymptomatic although she had a mild dizziness on arrival and was given a dose of meclizine.  Her dizziness does not seem to last for any extended length of time.  I do not see any suggestions to indicate this is a central etiology for the dizziness.  Her labs are non-concerning.  Her urinalysis is benign.  Given that she is on Coumadin, head CT was performed and shows no acute abnormality.  Her vital signs are stable.  She does not appear systemically ill.  She was discharged home in good condition.  She was encouraged to follow-up with her PCP.  Return precautions were given.  Final Clinical Impressions(s) / ED Diagnoses   Final diagnoses:  Vertigo    ED Discharge Orders         Ordered    meclizine (ANTIVERT) 25 MG tablet  3 times daily PRN     05/14/18 1442           Malvin Johns, MD 05/14/18 1444

## 2018-06-15 ENCOUNTER — Ambulatory Visit (INDEPENDENT_AMBULATORY_CARE_PROVIDER_SITE_OTHER): Payer: Medicare Other | Admitting: General Practice

## 2018-06-15 ENCOUNTER — Other Ambulatory Visit: Payer: Self-pay

## 2018-06-15 DIAGNOSIS — Z7901 Long term (current) use of anticoagulants: Secondary | ICD-10-CM

## 2018-06-15 LAB — POCT INR: INR: 2.6 (ref 2.0–3.0)

## 2018-06-15 NOTE — Patient Instructions (Signed)
Pre visit review using our clinic review tool, if applicable. No additional management support is needed unless otherwise documented below in the visit note.  Continue to take 2.5 mg daily except 5 mg on Monday/Fridays.  Re-check in 4 weeks.

## 2018-06-26 ENCOUNTER — Other Ambulatory Visit: Payer: Self-pay | Admitting: Internal Medicine

## 2018-06-26 MED ORDER — METOPROLOL SUCCINATE ER 50 MG PO TB24: 50.0000 mg | ORAL_TABLET | Freq: Every day | ORAL | 3 refills | Status: DC

## 2018-06-28 ENCOUNTER — Other Ambulatory Visit: Payer: Self-pay

## 2018-06-28 ENCOUNTER — Telehealth: Payer: Self-pay

## 2018-06-28 MED ORDER — MECLIZINE HCL 25 MG PO TABS: 25.0000 mg | ORAL_TABLET | Freq: Three times a day (TID) | ORAL | 0 refills | Status: DC | PRN

## 2018-06-28 NOTE — Telephone Encounter (Signed)
Yes, lets do a virtual and reschedule her CPE

## 2018-06-28 NOTE — Telephone Encounter (Signed)
Spoke with pt to reschedule her CPE for Friday. States she has been having issues with her blood pressure since she was in the ED on 05/14/18. States her upper number has been in the 150's and 160's. Bottom number has been in the 70's has been taking all medications as prescribed. Can not do virtual visit. Please advise.

## 2018-06-29 ENCOUNTER — Encounter: Payer: Self-pay | Admitting: Internal Medicine

## 2018-06-29 ENCOUNTER — Other Ambulatory Visit: Payer: Self-pay

## 2018-06-29 ENCOUNTER — Ambulatory Visit: Payer: Medicare Other | Admitting: Internal Medicine

## 2018-06-29 ENCOUNTER — Ambulatory Visit (INDEPENDENT_AMBULATORY_CARE_PROVIDER_SITE_OTHER): Payer: Medicare Other | Admitting: Internal Medicine

## 2018-06-29 VITALS — BP 154/72 | HR 72 | Temp 98.2°F | Resp 16 | Ht 60.0 in | Wt 185.4 lb

## 2018-06-29 DIAGNOSIS — I10 Essential (primary) hypertension: Secondary | ICD-10-CM

## 2018-06-29 MED ORDER — BENAZEPRIL HCL 40 MG PO TABS: 40.0000 mg | ORAL_TABLET | Freq: Every day | ORAL | 1 refills | Status: DC

## 2018-06-29 NOTE — Assessment & Plan Note (Signed)
Currently uncontrolled without obvious reason why it has been more elevated recently She has been less active there has been slight weight gain, which could be contributing Continue metoprolol 50 mg daily Continue amlodipine 5 mg daily Will increase benazepril to 40 mg daily  She will come in to have a BMP next week  She will monitor her blood pressure at home and update me in a week or so  She will call with any questions or concerns.

## 2018-06-29 NOTE — Telephone Encounter (Signed)
Appointment scheduled for today 

## 2018-06-29 NOTE — Progress Notes (Signed)
Subjective:    Patient ID: Bonnie Zhang, female    DOB: 1940/10/01, 78 y.o.   MRN: [id number]  HPI The patient is here for an acute visit because her BP has been elevated at home.  She is not exercising regularly.  She is less active since the coronavirus situation started.  Hypertension: She is taking her medication daily. She is compliant with a low sodium diet.  She denies chest pain, palpitations, edema, shortness of breath and regular headaches. She does monitor her blood pressure at home - high 150's/78-79.    She went to the ED for vertigo and is still having some dizziness.  The meclizine is helping.  Her blood pressure was elevated when she went to the emergency room.  Initially when she got home from the emergency room the blood pressure was okay, but over the past week or so it has been persistently elevated in the 425Z systolically.    Medications and allergies reviewed with patient and updated if appropriate.  Patient Active Problem List   Diagnosis Date Noted  . Hyperglycemia 06/29/2017  . Vertigo 04/07/2017  . Long term (current) use of anticoagulants 01/07/2017  . Carotid artery disease ([place]) 02/01/2016  . Bilateral leg edema 06/21/2015  . Vitamin B12 deficiency (non anemic) 01/24/2015  . 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 Primary localized osteoarthritis of left knee   . Encounter for therapeutic drug monitoring 06/07/2013  . GERD (gastroesophageal reflux disease)   . PVD (peripheral vascular disease) ([place])   . 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 10/09/2010  . Osteoporosis 03/21/2010  . History of colonic polyps 03/20/2008  . Vitamin D deficiency 08/25/2007  . HYPERLIPIDEMIA 03/18/2007  . Dysmetabolic syndrome X 56/38/7564  . Essential hypertension 03/18/2007  . DVT, HX OF 07/23/2006    Current Outpatient Medications on File Prior to Visit  Medication Sig Dispense  Refill  . amLODipine (NORVASC) 5 MG tablet TAKE 1 TABLET BY MOUTH ONCE DAILY 90 tablet 1  . benazepril (LOTENSIN) 20 MG tablet TAKE 1 TABLET BY MOUTH ONCE DAILY 90 tablet 1  . meclizine (ANTIVERT) 25 MG tablet Take 1 tablet (25 mg total) by mouth 3 (three) times daily as needed for dizziness. 15 tablet 0  . metoprolol succinate (TOPROL-XL) 50 MG 24 hr tablet Take 1 tablet (50 mg total) by mouth daily. Take with or immediately following a meal. 90 tablet 3  . pantoprazole (PROTONIX) 40 MG tablet TAKE 1 TABLET BY MOUTH ONCE DAILY AS NEEDED FOR  ACID  REFLUX 90 tablet 0  . simvastatin (ZOCOR) 10 MG tablet Take 1 tablet (10 mg total) by mouth at bedtime. Annual appt due April must see provider for future refills 90 tablet 0  . warfarin (COUMADIN) 5 MG tablet TAKE AS DIRECTED BY ANTICOAGULATION CLINIC 90 tablet 1   No current facility-administered medications on file prior to visit.     Past Medical History:  Diagnosis Date  . Anticoagulated on Coumadin 2001   2 blood clots  . Bruises easily    pt is on Coumadin;last dose of Coumadin 08/05/11 and then Lovenox started 08/07/11  . DVT (deep vein thrombosis) in pregnancy     1965 post C section;2001 with prolonged driving while on  HRT  . DVT (deep venous thrombosis) ([place]) 2001   Was on Prempro.    Marland Kitchen GERD (gastroesophageal reflux disease)    Protonix prn  . High cholesterol  takes Simvastating daily  . History of colon polyps 2007   Dr Sharlett Iles  . 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 Hx of colonic polyps    Dr Sharlett Iles  . Hx of migraines    yrs ago   . Hypertension    takes Benazepril,Amlodipine,and Metoprolol daily  . Osteoporosis   . PONV (postoperative nausea and vomiting)   . Primary localized osteoarthritis of left knee   . PVD (peripheral vascular disease) ([place]) 1965, 2001   abnormal venous doppler findings due to recurrent DVT'S left leg  . Right knee DJD    knees  . Shortness of breath dyspnea   . Vitamin B12 deficiency (non  anemic) 01/24/2015  . Vitamin D deficiency    takes VIt D daily    Past Surgical History:  Procedure Laterality Date  . ABDOMINAL HYSTERECTOMY    . APPENDECTOMY  1965  . BREAST LUMPECTOMY  1987  . CESAREAN SECTION  1961/1965/1966   X 3  . CHOLECYSTECTOMY  1997  . COLONOSCOPY W/ POLYPECTOMY  2007   Dr  Sharlett Iles; due? 2012  . DILATION AND CURETTAGE OF UTERUS  2012   uterine polyp, Dr Kennon Rounds  . ESOPHAGOGASTRODUODENOSCOPY  1997  . HYSTEROSCOPY W/D&C N/A 08/05/2012   Procedure: DILATATION AND CURETTAGE /HYSTEROSCOPY;  Surgeon: Donnamae Jude, MD;  Location: [facility];  Service: Gynecology;  Laterality: N/A;  . ROBOTIC ASSISTED TOTAL HYSTERECTOMY WITH BILATERAL SALPINGO OOPHERECTOMY Right 09/14/2012   Procedure: ROBOTIC ASSISTED TOTAL HYSTERECTOMY WITH BILATERAL SALPINGO OOPHORECTOMY ,right pelvic LYMPHNODE dissection;  Surgeon: Imagene Gurney A. Alycia Rossetti, MD;  Location: [facility];  Service: Gynecology;  Laterality: Right;  . TOTAL KNEE ARTHROPLASTY  08/11/2011   Procedure: TOTAL KNEE ARTHROPLASTY;  Surgeon: Lorn Junes, MD;  Location: [facility];  Service: Orthopedics;  Laterality: Right;  DR [place] THIS CASE  . TOTAL KNEE ARTHROPLASTY Left 09/11/2014  . TOTAL KNEE ARTHROPLASTY Left 09/11/2014   Procedure: TOTAL KNEE ARTHROPLASTY;  Surgeon: Elsie Saas, MD;  Location: [facility];  Service: Orthopedics;  Laterality: Left;  . WISDOM TOOTH EXTRACTION      Social History   Socioeconomic History  . Marital status: Divorced    Spouse name: Not on file  . Number of children: 1  . Years of education: Not on file  . Highest education level: Not on file  Occupational History  . Not on file  Social Needs  . Financial resource strain: Not hard at all  . Food insecurity:    Worry: Never true    Inability: Never true  . Transportation needs:    Medical: No    Non-medical: No  Tobacco Use  . Smoking status: Never Smoker  . Smokeless tobacco: Never Used  Substance and Sexual Activity  .  Alcohol use: No  . Drug use: No  . Sexual activity: Never    Birth control/protection: Post-menopausal    Comment: retired. has 2 daughters  Lifestyle  . Physical activity:    Days per week: 0 days    Minutes per session: 0 min  . Stress: Not at all  Relationships  . Social connections:    Talks on phone: More than three times a week    Gets together: More than three times a week    Attends religious service: Never    Active member of club or organization: Yes    Attends meetings of clubs or organizations: More than 4 times per year    Relationship status: Divorced  Other Topics  Concern  . Not on file  Social History Narrative   No regular exercise    Family History  Problem Relation Age of Onset  . Kidney disease Mother        ? hypertensive  . Hypertension Mother   . Deep vein thrombosis Mother        post ankle fracture  . Leukemia Father   . Colon cancer Maternal Grandmother   . Diabetes Other        cousins  . Anesthesia problems Neg Hx   . Hypotension Neg Hx   . Malignant hyperthermia Neg Hx   . Pseudochol deficiency Neg Hx   . Stroke Neg Hx   . Heart disease Neg Hx     Review of Systems  Constitutional: Negative for fever.  Respiratory: Negative for shortness of breath.   Cardiovascular: Negative for chest pain, palpitations and leg swelling.  Neurological: Positive for dizziness (intermittent) and headaches (dull ).       Objective:   Vitals:   06/29/18 1212  BP: (!) 154/72  Pulse: 72  Resp: 16  Temp: 98.2 F (36.8 C)  SpO2: 97%   BP Readings from Last 3 Encounters:  06/29/18 (!) 154/72  05/14/18 (!) 145/61  02/16/18 130/64   Wt Readings from Last 3 Encounters:  06/29/18 185 lb 6.4 oz (84.1 kg)  02/16/18 176 lb (79.8 kg)  01/07/18 179 lb (81.2 kg)   Body mass index is 36.21 kg/m.   Physical Exam    Constitutional: Appears well-developed and well-nourished. No distress.  HENT:  Head: Normocephalic and atraumatic.  Neck: Neck  supple. No tracheal deviation present. No thyromegaly present.  No cervical lymphadenopathy Cardiovascular: Normal rate, regular rhythm and normal heart sounds.   No murmur heard. No carotid bruit .  Mild left lower extremity edema-chronic from previous DVT, no right lower extremity edema Pulmonary/Chest: Effort normal and breath sounds normal. No respiratory distress. No has no wheezes. No rales.  Skin: Skin is warm and dry. Not diaphoretic.  Psychiatric: Normal mood and affect. Behavior is normal.       Assessment & Plan:    See Problem List for Assessment and Plan of chronic medical problems.

## 2018-06-29 NOTE — Patient Instructions (Addendum)
Have blood work in 1-2 weeks.  You do not need to fast.    Medications reviewed and updated.  Changes include :   Increase benazepril to 40 mg daily.     Your prescription(s) have been submitted to your pharmacy. Please take as directed and contact our office if you believe you are having problem(s) with the medication(s).      Monitor your BP at home and call with an update in a week or so with your numbers.

## 2018-07-02 ENCOUNTER — Encounter: Payer: Medicare Other | Admitting: Internal Medicine

## 2018-07-06 ENCOUNTER — Other Ambulatory Visit (INDEPENDENT_AMBULATORY_CARE_PROVIDER_SITE_OTHER): Payer: Medicare Other | Admitting: Emergency Medicine

## 2018-07-06 ENCOUNTER — Other Ambulatory Visit (INDEPENDENT_AMBULATORY_CARE_PROVIDER_SITE_OTHER): Payer: Medicare Other

## 2018-07-06 DIAGNOSIS — I10 Essential (primary) hypertension: Secondary | ICD-10-CM

## 2018-07-06 LAB — BASIC METABOLIC PANEL
BUN: 11 mg/dL (ref 6–23)
CO2: 28 mEq/L (ref 19–32)
Calcium: 9.3 mg/dL (ref 8.4–10.5)
Chloride: 105 mEq/L (ref 96–112)
Creatinine, Ser: 0.78 mg/dL (ref 0.40–1.20)
GFR: 71.64 mL/min (ref >60.00)
Glucose, Bld: 94 mg/dL (ref 70–99)
Potassium: 3.9 mEq/L (ref 3.5–5.1)
Sodium: 141 mEq/L (ref 135–145)

## 2018-07-06 NOTE — Telephone Encounter (Signed)
Pt aware of response and expressed understanding.  

## 2018-07-06 NOTE — Telephone Encounter (Signed)
Pt was told to call and let you know how her blood pressure was after changing her medication. The average is 150/78. Thanks.

## 2018-07-06 NOTE — Telephone Encounter (Signed)
Her kidney function and potassium are normal with the increased dose of her medication  Please have her continue to monitor her blood pressure for 1 more week and then let us know.  There really has not been much change and if there is no change over the next week we will need to do something different.  Have her call us in 1 week and let us know if her blood pressure has decreased

## 2018-07-15 NOTE — Telephone Encounter (Signed)
The next option for medication is hydralazine - it is taken three times a day.  We will start at 25 mg and increase if needed.  rx pending.  Have her call next week with an update, sooner if needed.

## 2018-07-15 NOTE — Addendum Note (Signed)
Addended by: [name] on: 07/15/2018 11:41 AM   Modules accepted: Orders

## 2018-07-15 NOTE — Telephone Encounter (Signed)
I have blocked your schedule for 9:30 tomorrow morning for you to call Mrs. Muellner. She is aware.

## 2018-07-15 NOTE — Telephone Encounter (Signed)
Pt called and stated that she has been keeping up with BP. Pt states that BP has been 150/80. Pt states that her BP has not really changed at all. Pt would like a call back from the nurse.  Please advise [phone]

## 2018-07-15 NOTE — Telephone Encounter (Signed)
LVM for pt to call back in regards.  

## 2018-07-15 NOTE — Telephone Encounter (Signed)
I can call her tomorrow I guess

## 2018-07-15 NOTE — Telephone Encounter (Signed)
Pt states concern for adding an extra medication. Has questions she would like to discuss with you. She ended up having to come in the last appointment since she had no access to virtual. I do not know if you want to call her or just have her come back.

## 2018-07-16 MED ORDER — AMLODIPINE BESYLATE 5 MG PO TABS: 10.0000 mg | ORAL_TABLET | Freq: Every day | ORAL | 1 refills | Status: DC

## 2018-07-16 NOTE — Telephone Encounter (Addendum)
Telemedicine via telephone call  Cumulative time during 7-day interval 7 minutes, there was not an associated office visit for this concern within a 7 day period.  Verbal consent for services obtained from patient prior to services given.  Names of all persons present for services: [name], MD, [name]   Chief complaint: elevated blood pressure  A couple of weeks ago she was in the office for elevated blood pressure and we doubled her benazepril from 20 mg daily to 40 mg daily.  There has not been much of an effect on the blood pressure.  Her blood pressure has typically been in the 150s/70-80s.  She does not think she is having any symptoms related to elevated blood pressure.  She has not had headaches or lightheadedness.  She denies chest pain or palpitations.  She does have occasional dizziness, but this is not new and she was seen in the emergency room for this.  She did take meclizine as needed, but it did not work all that well.  The dizziness is not bad and only comes on occasion.  She is chronic left lower extremity edema that is stable.  She is wondering what she can do with her blood pressure and what changes we need to make.  History, background, results pertinent:  Past Medical History:  Diagnosis Date  . Anticoagulated on Coumadin 2001   2 blood clots  . Bruises easily    pt is on Coumadin;last dose of Coumadin 08/05/11 and then Lovenox started 08/07/11  . DVT (deep vein thrombosis) in pregnancy     1965 post C section;2001 with prolonged driving while on  HRT  . DVT (deep venous thrombosis) ([place]) 2001   Was on Prempro.    Marland Kitchen GERD (gastroesophageal reflux disease)    Protonix prn  . High cholesterol    takes Simvastating daily  . History of colon polyps 2007   Dr Sharlett Iles  . 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 Hx of colonic polyps    Dr Sharlett Iles  . Hx of migraines    yrs ago   . Hypertension    takes Benazepril,Amlodipine,and Metoprolol daily  .  Osteoporosis   . PONV (postoperative nausea and vomiting)   . Primary localized osteoarthritis of left knee   . PVD (peripheral vascular disease) ([place]) 1965, 2001   abnormal venous doppler findings due to recurrent DVT'S left leg  . Right knee DJD    knees  . Shortness of breath dyspnea   . Vitamin B12 deficiency (non anemic) 01/24/2015  . Vitamin D deficiency    takes VIt D daily   @RESULTS48 @  A/P/next steps: Uncontrolled hypertension  We discussed the options of what medications we can change or add.  She would like to keep it as easy as possible.  We cannot increase metoprolol because her heart rate is already on the low side.  Increasing benazepril was not that effective.  Discussed increasing amlodipine or trying spironolactone or hydralazine.  We will try increasing amlodipine to 10 mg daily.  She has enough pills at home and will double up for the next several days and let us know how her blood pressure is with the higher dose.  Discussed that this could worsen her left lower extremity edema.  She is unlikely to have any other side effects.  Continue to monitor blood pressure daily.  Call earlier than next week with any questions or concerns.

## 2018-07-16 NOTE — Addendum Note (Signed)
Addended by: [name] on: 07/16/2018 09:51 AM   Modules accepted: Orders

## 2018-07-20 ENCOUNTER — Ambulatory Visit (INDEPENDENT_AMBULATORY_CARE_PROVIDER_SITE_OTHER): Payer: Medicare Other | Admitting: General Practice

## 2018-07-20 DIAGNOSIS — Z86718 Personal history of other venous thrombosis and embolism: Secondary | ICD-10-CM

## 2018-07-20 DIAGNOSIS — Z7901 Long term (current) use of anticoagulants: Secondary | ICD-10-CM

## 2018-07-20 LAB — POCT INR: INR: 2.5 (ref 2.0–3.0)

## 2018-07-20 NOTE — Progress Notes (Signed)
Agree with management.  Ailee Pates J Mirtie Bastyr, MD  

## 2018-07-20 NOTE — Patient Instructions (Addendum)
Pre visit review using our clinic review tool, if applicable. No additional management support is needed unless otherwise documented below in the visit note.  Continue to take 2.5 mg daily except 5 mg on Monday/Fridays.  Re-check in 4 to 6 weeks.

## 2018-07-23 ENCOUNTER — Other Ambulatory Visit: Payer: Self-pay

## 2018-07-23 MED ORDER — AMLODIPINE BESYLATE 10 MG PO TABS: 10.0000 mg | ORAL_TABLET | Freq: Every day | ORAL | 1 refills | Status: DC

## 2018-07-23 NOTE — Telephone Encounter (Signed)
Patient states that 139/72 is the average of her BP with the increase of amlodipine to 10 mg. Ok to go ahead and send a new rx to pharmacy for 10 mg tablet?

## 2018-07-23 NOTE — Telephone Encounter (Signed)
Copied from [place] [id number]. Topic: General - Other >> Jul 22, 2018  2:51 PM Rainey Pines A wrote: Reason for HTX:[id number] stated that she would like Lovena Le to give her a callback once she returns to office. Patient was instructed to callback and speak with her in regards to new medication she is taking and to provide an update.

## 2018-07-23 NOTE — Telephone Encounter (Signed)
Yes ok to send in.  Pending.

## 2018-07-27 ENCOUNTER — Ambulatory Visit: Payer: Medicare Other

## 2018-08-03 ENCOUNTER — Telehealth: Payer: Self-pay | Admitting: Internal Medicine

## 2018-08-03 MED ORDER — SIMVASTATIN 10 MG PO TABS: 10.0000 mg | ORAL_TABLET | Freq: Every day | ORAL | 0 refills | Status: DC

## 2018-08-03 NOTE — Telephone Encounter (Signed)
Copied from [place] [id number]. Topic: Quick Communication - Rx Refill/Question >> Aug 03, 2018  9:36 AM Waylan Rocher, Lumin L wrote: Medication: simvastatin (ZOCOR) 10 MG tablet   Has the patient contacted their pharmacy? {yes  (Agent: If no, request that the patient contact the pharmacy for the refill.) (Agent: If yes, when and what did the pharmacy advise?)  Preferred Pharmacy (with phone number or street name): [place], [place] [address] Phone: [phone] Fax: [phone]  Agent: Please be advised that RX refills may take up to 3 business days. We ask that you follow-up with your pharmacy.

## 2018-08-03 NOTE — Telephone Encounter (Signed)
Patient called back to schedule. Most likely unable to do virtual. Would you like this as a phone visit or in person? She is currently scheduled for a CPE on 08/25/2018.

## 2018-08-03 NOTE — Telephone Encounter (Signed)
Rx sent for 30 day supply. LVM for pt to call back and get a follow up scheduled. Overdue to be seen for routine.

## 2018-08-03 NOTE — Telephone Encounter (Signed)
We can do a phone f/u this month and do a physical in 6 months if she is ok with that

## 2018-08-04 ENCOUNTER — Telehealth: Payer: Self-pay

## 2018-08-04 MED ORDER — SIMVASTATIN 10 MG PO TABS: 10.0000 mg | ORAL_TABLET | Freq: Every day | ORAL | 0 refills | Status: DC

## 2018-08-04 NOTE — Telephone Encounter (Signed)
This issue has been addressed in another phone note.

## 2018-08-04 NOTE — Telephone Encounter (Signed)
Copied from [place] [id number]. Topic: General - Other >> Aug 03, 2018  5:37 PM Mcneil, Ja-Kwan wrote: Reason for CRM: Pt returned call to the office. Pt requests call back.

## 2018-08-04 NOTE — Telephone Encounter (Signed)
Asked for a 90 day supply on this medication. She said that she was previously scheduled in April for the physical but was moved out due to [place]. I have changed the visit to a phone call.

## 2018-08-04 NOTE — Telephone Encounter (Signed)
90 day sent 

## 2018-08-04 NOTE — Addendum Note (Signed)
Addended by: Delice Bison E on: 08/04/2018 09:54 AM   Modules accepted: Orders

## 2018-08-19 NOTE — Progress Notes (Signed)
Subjective:    Patient ID: Bonnie Zhang, female    DOB: Jun 09, 1940, 78 y.o.   MRN: [id number]  HPI The patient is here for follow up.  She is not exercising regularly.     Hypertension.-Elevated blood pressure: Bp has been elevated all week.  She has been taking her medication.  When it is on the higher side she will have a dull headache and she feels flushed.  She feels better when the blood pressure is on the low side.  She is compliant with a low-sodium diet.  She is unsure why it has been higher.  She does monitor her blood pressure at home - 158-160/80's, 140/73, 132/71, 160/78 this morning    Hyperlipidemia: She is taking her medication daily. She is compliant with a low fat/cholesterol diet. She denies myalgias.   GERD:  She is taking her medication daily as prescribed.  She denies any GERD symptoms and feels her GERD is well controlled.   Hyperglycemia:  She is compliant with a low sugar/carbohydrate diet.  She is exercising regularly.  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  OP:  Her dexa is due.  She is not exercising regularly.  Dizziness: She continues to feel dizziness.  She had dizziness initially in February and that did resolve and just recently restarted.  It does not seem to be related to head movements.  She did have dizziness several years ago and that was worse than it is now.  The meclizine did not seem to help.   Medications and allergies reviewed with patient and updated if appropriate.  Patient Active Problem List   Diagnosis Date Noted  . Hyperglycemia 06/29/2017  . Vertigo 04/07/2017  . Long term (current) use of anticoagulants 01/07/2017  . Carotid artery disease ([place]) 02/01/2016  . Bilateral leg edema 06/21/2015  . Vitamin B12 deficiency (non anemic) 01/24/2015  . 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 Primary localized osteoarthritis of left knee    . Encounter for therapeutic drug monitoring 06/07/2013  . GERD (gastroesophageal reflux disease)   . PVD (peripheral vascular disease) ([place])   . 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 10/09/2010  . Osteoporosis 03/21/2010  . History of colonic polyps 03/20/2008  . Vitamin D deficiency 08/25/2007  . HYPERLIPIDEMIA 03/18/2007  . Dysmetabolic syndrome X 62/83/6629  . Essential hypertension 03/18/2007  . DVT, HX OF 07/23/2006    Current Outpatient Medications on File Prior to Visit  Medication Sig Dispense Refill  . amLODipine (NORVASC) 10 MG tablet Take 1 tablet (10 mg total) by mouth daily. 90 tablet 1  . benazepril (LOTENSIN) 40 MG tablet Take 1 tablet (40 mg total) by mouth daily. 90 tablet 1  . metoprolol succinate (TOPROL-XL) 50 MG 24 hr tablet Take 1 tablet (50 mg total) by mouth daily. Take with or immediately following a meal. 90 tablet 3  . pantoprazole (PROTONIX) 40 MG tablet TAKE 1 TABLET BY MOUTH ONCE DAILY AS NEEDED FOR  ACID  REFLUX 90 tablet 0  . simvastatin (ZOCOR) 10 MG tablet Take 1 tablet (10 mg total) by mouth at bedtime. Must see provider for future refills 90 tablet 0  . warfarin (COUMADIN) 5 MG tablet TAKE AS DIRECTED BY ANTICOAGULATION CLINIC 90 tablet 1   No current facility-administered medications on file prior to visit.     Past Medical History:  Diagnosis Date  . Anticoagulated on  Coumadin 2001   2 blood clots  . Bruises easily    pt is on Coumadin;last dose of Coumadin 08/05/11 and then Lovenox started 08/07/11  . DVT (deep vein thrombosis) in pregnancy     1965 post C section;2001 with prolonged driving while on  HRT  . DVT (deep venous thrombosis) ([place]) 2001   Was on Prempro.    Marland Kitchen GERD (gastroesophageal reflux disease)    Protonix prn  . High cholesterol    takes Simvastating daily  . History of colon polyps 2007   Dr Sharlett Iles  . 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 Hx of colonic polyps    Dr Sharlett Iles  . Hx  of migraines    yrs ago   . Hypertension    takes Benazepril,Amlodipine,and Metoprolol daily  . Osteoporosis   . PONV (postoperative nausea and vomiting)   . Primary localized osteoarthritis of left knee   . PVD (peripheral vascular disease) ([place]) 1965, 2001   abnormal venous doppler findings due to recurrent DVT'S left leg  . Right knee DJD    knees  . Shortness of breath dyspnea   . Vitamin B12 deficiency (non anemic) 01/24/2015  . Vitamin D deficiency    takes VIt D daily    Past Surgical History:  Procedure Laterality Date  . ABDOMINAL HYSTERECTOMY    . APPENDECTOMY  1965  . BREAST LUMPECTOMY  1987  . CESAREAN SECTION  1961/1965/1966   X 3  . CHOLECYSTECTOMY  1997  . COLONOSCOPY W/ POLYPECTOMY  2007   Dr  Sharlett Iles; due? 2012  . DILATION AND CURETTAGE OF UTERUS  2012   uterine polyp, Dr Kennon Rounds  . ESOPHAGOGASTRODUODENOSCOPY  1997  . HYSTEROSCOPY W/D&C N/A 08/05/2012   Procedure: DILATATION AND CURETTAGE /HYSTEROSCOPY;  Surgeon: Donnamae Jude, MD;  Location: [facility];  Service: Gynecology;  Laterality: N/A;  . ROBOTIC ASSISTED TOTAL HYSTERECTOMY WITH BILATERAL SALPINGO OOPHERECTOMY Right 09/14/2012   Procedure: ROBOTIC ASSISTED TOTAL HYSTERECTOMY WITH BILATERAL SALPINGO OOPHORECTOMY ,right pelvic LYMPHNODE dissection;  Surgeon: Imagene Gurney A. Alycia Rossetti, MD;  Location: [facility];  Service: Gynecology;  Laterality: Right;  . TOTAL KNEE ARTHROPLASTY  08/11/2011   Procedure: TOTAL KNEE ARTHROPLASTY;  Surgeon: Lorn Junes, MD;  Location: [facility];  Service: Orthopedics;  Laterality: Right;  DR Edgard THIS CASE  . TOTAL KNEE ARTHROPLASTY Left 09/11/2014  . TOTAL KNEE ARTHROPLASTY Left 09/11/2014   Procedure: TOTAL KNEE ARTHROPLASTY;  Surgeon: Elsie Saas, MD;  Location: [facility];  Service: Orthopedics;  Laterality: Left;  . WISDOM TOOTH EXTRACTION      Social History   Socioeconomic History  . Marital status: Divorced    Spouse name: Not on file  . Number of children: 1   . Years of education: Not on file  . Highest education level: Not on file  Occupational History  . Not on file  Social Needs  . Financial resource strain: Not hard at all  . Food insecurity:    Worry: Never true    Inability: Never true  . Transportation needs:    Medical: No    Non-medical: No  Tobacco Use  . Smoking status: Never Smoker  . Smokeless tobacco: Never Used  Substance and Sexual Activity  . Alcohol use: No  . Drug use: No  . Sexual activity: Never    Birth control/protection: Post-menopausal    Comment: retired. has 2 daughters  Lifestyle  . Physical activity:    Days per week: 0 days  Minutes per session: 0 min  . Stress: Not at all  Relationships  . Social connections:    Talks on phone: More than three times a week    Gets together: More than three times a week    Attends religious service: Never    Active member of club or organization: Yes    Attends meetings of clubs or organizations: More than 4 times per year    Relationship status: Divorced  Other Topics Concern  . Not on file  Social History Narrative   No regular exercise    Family History  Problem Relation Age of Onset  . Kidney disease Mother        ? hypertensive  . Hypertension Mother   . Deep vein thrombosis Mother        post ankle fracture  . Leukemia Father   . Colon cancer Maternal Grandmother   . Diabetes Other        cousins  . Anesthesia problems Neg Hx   . Hypotension Neg Hx   . Malignant hyperthermia Neg Hx   . Pseudochol deficiency Neg Hx   . Stroke Neg Hx   . Heart disease Neg Hx     Review of Systems  Constitutional: Negative for chills and fever.  Respiratory: Negative for cough, shortness of breath and wheezing.   Cardiovascular: Positive for leg swelling. Negative for chest pain and palpitations.  Neurological: Positive for dizziness and headaches.       Objective:   Vitals:   08/20/18 0942  BP: (!) 142/62  Pulse: 79  Resp: 16  Temp: 98.6 F (37  C)  SpO2: 97%   BP Readings from Last 3 Encounters:  08/20/18 (!) 142/62  06/29/18 (!) 154/72  05/14/18 (!) 145/61   Wt Readings from Last 3 Encounters:  08/20/18 183 lb 6.4 oz (83.2 kg)  06/29/18 185 lb 6.4 oz (84.1 kg)  02/16/18 176 lb (79.8 kg)   Body mass index is 35.82 kg/m.   Physical Exam    Constitutional: Appears well-developed and well-nourished. No distress.  HENT:  Head: Normocephalic and atraumatic.  Neck: Neck supple. No tracheal deviation present. No thyromegaly present.  No cervical lymphadenopathy Cardiovascular: Normal rate, regular rhythm and normal heart sounds.   No murmur heard. No carotid bruit .  Chronic bilateral lower extremity edema-left lower extremity worse than right Pulmonary/Chest: Effort normal and breath sounds normal. No respiratory distress. No has no wheezes. No rales.  Neurological: Gait normal, nonfocal exam Skin: Skin is warm and dry. Not diaphoretic.  Psychiatric: Normal mood and affect. Behavior is normal.      Assessment & Plan:    See Problem List for Assessment and Plan of chronic medical problems.

## 2018-08-19 NOTE — Patient Instructions (Addendum)
Have blood work done on 6/2 when you are here.   Tests ordered today. Your results will be released to [place] (or called to you) after review, usually within 72hours after test completion. If any changes need to be made, you will be notified at that same time.   Medications reviewed and updated.  Changes include :   Start spironolactone 12. 5 mg every morning for your BP.  Continue to monitor your BP at home.    Your prescription(s) have been submitted to your pharmacy. Please take as directed and contact our office if you believe you are having problem(s) with the medication(s).    Please followup in 6 months

## 2018-08-20 ENCOUNTER — Other Ambulatory Visit: Payer: Self-pay

## 2018-08-20 ENCOUNTER — Encounter: Payer: Self-pay | Admitting: Internal Medicine

## 2018-08-20 ENCOUNTER — Ambulatory Visit (INDEPENDENT_AMBULATORY_CARE_PROVIDER_SITE_OTHER): Payer: Medicare Other | Admitting: Internal Medicine

## 2018-08-20 VITALS — BP 142/62 | HR 79 | Temp 98.6°F | Resp 16 | Ht 60.0 in | Wt 183.4 lb

## 2018-08-20 DIAGNOSIS — K219 Gastro-esophageal reflux disease without esophagitis: Secondary | ICD-10-CM | POA: Diagnosis not present

## 2018-08-20 DIAGNOSIS — E782 Mixed hyperlipidemia: Secondary | ICD-10-CM | POA: Diagnosis not present

## 2018-08-20 DIAGNOSIS — I10 Essential (primary) hypertension: Secondary | ICD-10-CM

## 2018-08-20 DIAGNOSIS — D6851 Activated protein C resistance: Secondary | ICD-10-CM | POA: Diagnosis not present

## 2018-08-20 DIAGNOSIS — R600 Localized edema: Secondary | ICD-10-CM

## 2018-08-20 DIAGNOSIS — M810 Age-related osteoporosis without current pathological fracture: Secondary | ICD-10-CM

## 2018-08-20 DIAGNOSIS — R739 Hyperglycemia, unspecified: Secondary | ICD-10-CM | POA: Diagnosis not present

## 2018-08-20 MED ORDER — SPIRONOLACTONE 25 MG PO TABS: 12.5000 mg | ORAL_TABLET | Freq: Every day | ORAL | 5 refills | Status: DC

## 2018-08-20 NOTE — Assessment & Plan Note (Signed)
Check lipid panel, CMP, TSH Continue daily statin Regular exercise and healthy diet encouraged  

## 2018-08-20 NOTE — Assessment & Plan Note (Signed)
Chronic, stable Medication not needed, but will be putting her on the spironolactone low-dose for elevated blood pressure, which may improve her edema CMP

## 2018-08-20 NOTE — Assessment & Plan Note (Addendum)
DVT left lower extremity twice Lifelong warfarin Following with Jenny Reichmann and her Coumadin clinic Continue Check CBC

## 2018-08-20 NOTE — Assessment & Plan Note (Signed)
Blood pressure okay here today, but has been elevated at home and is variable Will add spironolactone 12.5 mg daily Continue other medications She will have blood work done in about 2 weeks to check her kidney function, potassium Continue to monitor at home-she will let me know if her blood pressure is not controlled CMP

## 2018-08-20 NOTE — Assessment & Plan Note (Signed)
DEXA is due, but she would like to hold off for now given coronavirus situation Not currently exercising-stressed regular exercise

## 2018-08-20 NOTE — Assessment & Plan Note (Signed)
GERD controlled Continue daily medication  

## 2018-08-20 NOTE — Assessment & Plan Note (Signed)
Check a1c Low sugar / carb diet Stressed regular exercise   

## 2018-08-25 ENCOUNTER — Ambulatory Visit: Payer: Medicare Other | Admitting: Internal Medicine

## 2018-08-31 ENCOUNTER — Other Ambulatory Visit (INDEPENDENT_AMBULATORY_CARE_PROVIDER_SITE_OTHER): Payer: Medicare Other

## 2018-08-31 ENCOUNTER — Ambulatory Visit (INDEPENDENT_AMBULATORY_CARE_PROVIDER_SITE_OTHER): Payer: Medicare Other | Admitting: General Practice

## 2018-08-31 ENCOUNTER — Other Ambulatory Visit: Payer: Self-pay

## 2018-08-31 DIAGNOSIS — R739 Hyperglycemia, unspecified: Secondary | ICD-10-CM

## 2018-08-31 DIAGNOSIS — I10 Essential (primary) hypertension: Secondary | ICD-10-CM

## 2018-08-31 DIAGNOSIS — E782 Mixed hyperlipidemia: Secondary | ICD-10-CM | POA: Diagnosis not present

## 2018-08-31 DIAGNOSIS — Z7901 Long term (current) use of anticoagulants: Secondary | ICD-10-CM

## 2018-08-31 DIAGNOSIS — D6851 Activated protein C resistance: Secondary | ICD-10-CM | POA: Diagnosis not present

## 2018-08-31 DIAGNOSIS — Z86718 Personal history of other venous thrombosis and embolism: Secondary | ICD-10-CM

## 2018-08-31 LAB — COMPREHENSIVE METABOLIC PANEL
ALT: 9 U/L (ref 0–35)
AST: 15 U/L (ref 0–37)
Albumin: 4.3 g/dL (ref 3.5–5.2)
Alkaline Phosphatase: 92 U/L (ref 39–117)
BUN: 12 mg/dL (ref 6–23)
CO2: 30 mEq/L (ref 19–32)
Calcium: 9.6 mg/dL (ref 8.4–10.5)
Chloride: 102 mEq/L (ref 96–112)
Creatinine, Ser: 0.81 mg/dL (ref 0.40–1.20)
GFR: 68.56 mL/min (ref >60.00)
Glucose, Bld: 93 mg/dL (ref 70–99)
Potassium: 4.3 mEq/L (ref 3.5–5.1)
Sodium: 139 mEq/L (ref 135–145)
Total Bilirubin: 0.7 mg/dL (ref 0.2–1.2)
Total Protein: 7.7 g/dL (ref 6.0–8.3)

## 2018-08-31 LAB — CBC WITH DIFFERENTIAL/PLATELET
Basophils Absolute: 0 10*3/uL (ref 0.0–0.1)
Basophils Relative: 0.4 % (ref 0.0–3.0)
Eosinophils Absolute: 0.1 10*3/uL (ref 0.0–0.7)
Eosinophils Relative: 1.5 % (ref 0.0–5.0)
HCT: 43.3 % (ref 36.0–46.0)
Hemoglobin: 14.9 g/dL (ref 12.0–15.0)
Lymphocytes Relative: 35.5 % (ref 12.0–46.0)
Lymphs Abs: 2.3 10*3/uL (ref 0.7–4.0)
MCHC: 34.5 g/dL (ref 30.0–36.0)
MCV: 87.5 fl (ref 78.0–100.0)
Monocytes Absolute: 0.5 10*3/uL (ref 0.1–1.0)
Monocytes Relative: 7.8 % (ref 3.0–12.0)
Neutro Abs: 3.5 10*3/uL (ref 1.4–7.7)
Neutrophils Relative %: 54.8 % (ref 43.0–77.0)
Platelets: 195 10*3/uL (ref 150.0–400.0)
RBC: 4.94 Mil/uL (ref 3.87–5.11)
RDW: 13.4 % (ref 11.5–15.5)
WBC: 6.4 10*3/uL (ref 4.0–10.5)

## 2018-08-31 LAB — HEMOGLOBIN A1C: Hgb A1c MFr Bld: 5.5 % (ref 4.6–6.5)

## 2018-08-31 LAB — POCT INR: INR: 2.6 (ref 2.0–3.0)

## 2018-08-31 LAB — TSH: TSH: 1.17 u[IU]/mL (ref 0.35–4.50)

## 2018-08-31 NOTE — Patient Instructions (Signed)
Pre visit review using our clinic review tool, if applicable. No additional management support is needed unless otherwise documented below in the visit note.  Continue to take 2.5 mg daily except 5 mg on Monday/Fridays.  Re-check in 6 weeks.

## 2018-08-31 NOTE — Progress Notes (Signed)
Agree with management.  Adiel Erney J Hopelynn Gartland, MD  

## 2018-09-29 ENCOUNTER — Other Ambulatory Visit: Payer: Self-pay | Admitting: General Practice

## 2018-09-29 MED ORDER — WARFARIN SODIUM 5 MG PO TABS: ORAL_TABLET | ORAL | 1 refills | Status: DC

## 2018-10-05 ENCOUNTER — Telehealth: Payer: Self-pay | Admitting: Internal Medicine

## 2018-10-05 MED ORDER — MECLIZINE HCL 25 MG PO TABS: 25.0000 mg | ORAL_TABLET | Freq: Three times a day (TID) | ORAL | 0 refills | Status: DC | PRN

## 2018-10-05 NOTE — Telephone Encounter (Signed)
Ok - refill -- no appt needed

## 2018-10-05 NOTE — Telephone Encounter (Signed)
Pt would like a refill of her Vertigo medication she gets dizzy when standing, she was notified she may need an appt. Please advise

## 2018-10-05 NOTE — Addendum Note (Signed)
Addended by: Delice Bison E on: 10/05/2018 02:47 PM   Modules accepted: Orders

## 2018-10-05 NOTE — Telephone Encounter (Signed)
Rx sent. Pt aware.  

## 2018-10-05 NOTE — Telephone Encounter (Signed)
Would you prefer appointment?

## 2018-10-12 ENCOUNTER — Ambulatory Visit: Payer: Medicare Other

## 2018-10-12 ENCOUNTER — Other Ambulatory Visit: Payer: Self-pay

## 2018-10-12 ENCOUNTER — Ambulatory Visit (INDEPENDENT_AMBULATORY_CARE_PROVIDER_SITE_OTHER): Payer: Medicare Other | Admitting: General Practice

## 2018-10-12 DIAGNOSIS — Z7901 Long term (current) use of anticoagulants: Secondary | ICD-10-CM

## 2018-10-12 DIAGNOSIS — Z86718 Personal history of other venous thrombosis and embolism: Secondary | ICD-10-CM

## 2018-10-12 LAB — POCT INR: INR: 2.9 (ref 2.0–3.0)

## 2018-10-12 NOTE — Patient Instructions (Addendum)
Pre visit review using our clinic review tool, if applicable. No additional management support is needed unless otherwise documented below in the visit note.  Continue to take 2.5 mg daily except 5 mg on Monday/Fridays.  Re-check in 6 weeks.

## 2018-10-25 ENCOUNTER — Telehealth: Payer: Self-pay | Admitting: Internal Medicine

## 2018-10-25 MED ORDER — PANTOPRAZOLE SODIUM 40 MG PO TBEC: DELAYED_RELEASE_TABLET | ORAL | 0 refills | Status: DC

## 2018-10-25 NOTE — Telephone Encounter (Signed)
Medication sent.

## 2018-10-25 NOTE — Telephone Encounter (Signed)
Copied from [place] [id number]. Topic: Quick Communication - Rx Refill/Question >> Oct 25, 2018  9:27 AM Yvette Rack wrote: Medication: pantoprazole (PROTONIX) 40 MG tablet  Has the patient contacted their pharmacy? no  Preferred Pharmacy (with phone number or street name): [place], [place] [phone] (Phone) [phone] (Fax)  Agent: Please be advised that RX refills may take up to 3 business days. We ask that you follow-up with your pharmacy.

## 2018-11-23 ENCOUNTER — Ambulatory Visit: Payer: Medicare Other

## 2018-11-26 ENCOUNTER — Ambulatory Visit (INDEPENDENT_AMBULATORY_CARE_PROVIDER_SITE_OTHER): Payer: Medicare Other | Admitting: General Practice

## 2018-11-26 ENCOUNTER — Other Ambulatory Visit: Payer: Self-pay

## 2018-11-26 DIAGNOSIS — Z7901 Long term (current) use of anticoagulants: Secondary | ICD-10-CM | POA: Diagnosis not present

## 2018-11-26 DIAGNOSIS — Z86718 Personal history of other venous thrombosis and embolism: Secondary | ICD-10-CM

## 2018-11-26 LAB — POCT INR: INR: 3.2 — AB (ref 2.0–3.0)

## 2018-11-26 NOTE — Patient Instructions (Addendum)
Pre visit review using our clinic review tool, if applicable. No additional management support is needed unless otherwise documented below in the visit note.  Hold today and then continue to take 2.5 mg daily except 5 mg on Monday/Fridays.  Re-check in 4 weeks.

## 2018-11-26 NOTE — Progress Notes (Signed)
Agree with management.  Gregroy Dombkowski J Ellery Meroney, MD  

## 2018-12-13 ENCOUNTER — Other Ambulatory Visit: Payer: Self-pay | Admitting: Internal Medicine

## 2018-12-13 MED ORDER — SIMVASTATIN 10 MG PO TABS: 10.0000 mg | ORAL_TABLET | Freq: Every day | ORAL | 0 refills | Status: DC

## 2018-12-13 NOTE — Telephone Encounter (Signed)
Medication Refill - Medication: simvastatin (ZOCOR) 10 MG tablet Pt wants 90 day supply    Has the patient contacted their pharmacy? Yes.   (Agent: If no, request that the patient contact the pharmacy for the refill.) (Agent: If yes, when and what did the pharmacy advise?)  Preferred Pharmacy (with phone number or street name): [facility]  Pt has been taking this for years, and had appt in May.  Does not kow why there were no refills on it    Agent: Please be advised that RX refills may take up to 3 business days. We ask that you follow-up with your pharmacy.

## 2018-12-13 NOTE — Telephone Encounter (Signed)
Requested medication (s) are due for refill today: yes  Requested medication (s) are on the active medication list: yes  Last refill:  07/2018  Future visit scheduled: yes  Notes to clinic: review for refill  Requested Prescriptions  Pending Prescriptions Disp Refills   simvastatin (ZOCOR) 10 MG tablet 90 tablet 0    Sig: Take 1 tablet (10 mg total) by mouth at bedtime. Must see provider for future refills     Cardiovascular:  Antilipid - Statins Passed - 12/13/2018  8:59 AM      Passed - Total Cholesterol in normal range and within 360 days    Cholesterol, Total  Date Value Ref Range Status  05/25/2014 178 100 - 199 mg/dL Final   Cholesterol  Date Value Ref Range Status  12/29/2017 152 0 - 200 mg/dL Final    Comment:    ATP III Classification       Desirable:  < 200 mg/dL               Borderline High:  200 - 239 mg/dL          High:  > = 240 mg/dL         Passed - LDL in normal range and within 360 days    LDL (calc)  Date Value Ref Range Status  05/25/2014 74 0 - 99 mg/dL Final    Comment:                              Optimal               <  100                           Above optimal     100 -  129                           Borderline        130 -  159                           High              160 -  189                           Very high             >  189 LDL-C is inaccurate if patient is non-fasting.    LDL Cholesterol  Date Value Ref Range Status  12/29/2017 58 0 - 99 mg/dL Final         Passed - HDL in normal range and within 360 days    HDL-C  Date Value Ref Range Status  05/25/2014 76 >39 mg/dL Final   HDL  Date Value Ref Range Status  12/29/2017 65.40 >39.00 mg/dL Final         Passed - Triglycerides in normal range and within 360 days    Triglycerides  Date Value Ref Range Status  12/29/2017 145.0 0.0 - 149.0 mg/dL Final    Comment:    Normal:  <150 mg/dLBorderline High:  150 - 199 mg/dL  05/25/2014 138 0 - 149 mg/dL Final        Passed - Patient is not pregnant  Passed - Valid encounter within last 12 months    Recent Outpatient Visits          3 months ago Essential hypertension   [place], Claudina Lick, MD   5 months ago Uncontrolled hypertension   [place], Claudina Lick, MD   10 months ago LRTI (lower respiratory tract infection)   [place], Claudina Lick, MD   11 months ago Essential hypertension   [place], Claudina Lick, MD   1 year ago Preventative health care   [place], MD      Future Appointments            In 4 weeks  [place], [place]   In 2 months [place], Claudina Lick, MD [place], [place]

## 2018-12-24 ENCOUNTER — Ambulatory Visit (INDEPENDENT_AMBULATORY_CARE_PROVIDER_SITE_OTHER): Payer: Medicare Other | Admitting: General Practice

## 2018-12-24 ENCOUNTER — Other Ambulatory Visit: Payer: Self-pay

## 2018-12-24 DIAGNOSIS — Z86718 Personal history of other venous thrombosis and embolism: Secondary | ICD-10-CM

## 2018-12-24 DIAGNOSIS — Z7901 Long term (current) use of anticoagulants: Secondary | ICD-10-CM | POA: Diagnosis not present

## 2018-12-24 LAB — POCT INR: INR: 3.5 — AB (ref 2.0–3.0)

## 2018-12-24 NOTE — Progress Notes (Signed)
Agree with management.  Stacy J Burns, MD  

## 2018-12-24 NOTE — Patient Instructions (Addendum)
Pre visit review using our clinic review tool, if applicable. No additional management support is needed unless otherwise documented below in the visit note.  Hold today and then change dosage and take 1/2 tablet daily except 1 tablet on Mondays only.  Re-check in 3 to 4 weeks.

## 2018-12-30 ENCOUNTER — Other Ambulatory Visit: Payer: Self-pay | Admitting: Internal Medicine

## 2019-01-03 ENCOUNTER — Encounter: Payer: Self-pay | Admitting: Internal Medicine

## 2019-01-03 ENCOUNTER — Ambulatory Visit: Payer: Self-pay

## 2019-01-03 ENCOUNTER — Ambulatory Visit (INDEPENDENT_AMBULATORY_CARE_PROVIDER_SITE_OTHER): Payer: Medicare Other | Admitting: Internal Medicine

## 2019-01-03 ENCOUNTER — Other Ambulatory Visit: Payer: Self-pay

## 2019-01-03 DIAGNOSIS — G459 Transient cerebral ischemic attack, unspecified: Secondary | ICD-10-CM

## 2019-01-03 MED ORDER — SIMVASTATIN 20 MG PO TABS: 20.0000 mg | ORAL_TABLET | Freq: Every day | ORAL | 3 refills | Status: DC

## 2019-01-03 NOTE — Progress Notes (Signed)
Subjective:    Patient ID: Bonnie Zhang, female    DOB: 05/19/1940, 78 y.o.   MRN: [id number]  HPI The patient is here for an acute visit.   Two days she started getting flushed, vertigo, her BP felt high - she checked it and it was 161/84, she went to talk and could not say what she wanted to say and it sounded funny.  This episode lasted 5 minutes or so.  She took some meclizine.  Her BP went down to 135/74.  The rest of the day she did not have any residual symptoms.  Yesterday she felt ok, but not exactly normal.  She had a dull headache yesterday and slightly this morning.  This morning she feels ok.   She had a similar episode 05/2018 w/o the speech difficulty.  She did go to the ED at that time and was diagnosed with vertigo.  A Ct of the head at that time showed minimal chronic ischemic white matter disease and no acute intracranial abnormality.   She denies any numbness, tingling or weakness in her arms or legs.  She has not had any difficulty swallowing.  She is taking all of her medications daily as prescribed.  She is on warfarin and her last INR was last week, which was 3.5.    Medications and allergies reviewed with patient and updated if appropriate.  Patient Active Problem List   Diagnosis Date Noted  . Hyperglycemia 06/29/2017  . Vertigo 04/07/2017  . Long term (current) use of anticoagulants 01/07/2017  . Carotid artery disease ([place]) 02/01/2016  . Bilateral leg edema 06/21/2015  . Vitamin B12 deficiency (non anemic) 01/24/2015  . 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 Primary localized osteoarthritis of left knee   . Encounter for therapeutic drug monitoring 06/07/2013  . GERD (gastroesophageal reflux disease)   . PVD (peripheral vascular disease) ([place])   . 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 10/09/2010  . Osteoporosis 03/21/2010  . History of colonic polyps 03/20/2008  .  Vitamin D deficiency 08/25/2007  . HYPERLIPIDEMIA 03/18/2007  . Dysmetabolic syndrome X [id number]  . Essential hypertension 03/18/2007  . DVT, HX OF 07/23/2006    Current Outpatient Medications on File Prior to Visit  Medication Sig Dispense Refill  . amLODipine (NORVASC) 10 MG tablet Take 1 tablet (10 mg total) by mouth daily. 90 tablet 1  . benazepril (LOTENSIN) 40 MG tablet TAKE 1 TABLET BY MOUTH DAILY. 90 tablet 1  . meclizine (ANTIVERT) 25 MG tablet Take 1 tablet (25 mg total) by mouth 3 (three) times daily as needed for dizziness. 30 tablet 0  . metoprolol succinate (TOPROL-XL) 50 MG 24 hr tablet Take 1 tablet (50 mg total) by mouth daily. Take with or immediately following a meal. 90 tablet 3  . pantoprazole (PROTONIX) 40 MG tablet TAKE 1 TABLET BY MOUTH ONCE DAILY AS NEEDED FOR  ACID  REFLUX 90 tablet 0  . simvastatin (ZOCOR) 10 MG tablet Take 1 tablet (10 mg total) by mouth at bedtime. Must see provider for future refills 90 tablet 0  . spironolactone (ALDACTONE) 25 MG tablet Take 0.5 tablets (12.5 mg total) by mouth daily. 15 tablet 5  . warfarin (COUMADIN) 5 MG tablet Take 1 tablet daily or take as directed by anticoagulation clinic 90 tablet 1   No current facility-administered medications on file prior to visit.     Past Medical History:  Diagnosis Date  .  Anticoagulated on Coumadin 2001   2 blood clots  . Bruises easily    pt is on Coumadin;last dose of Coumadin 08/05/11 and then Lovenox started 08/07/11  . DVT (deep vein thrombosis) in pregnancy     1965 post C section;2001 with prolonged driving while on  HRT  . DVT (deep venous thrombosis) ([place]) 2001   Was on Prempro.    Marland Kitchen GERD (gastroesophageal reflux disease)    Protonix prn  . High cholesterol    takes Simvastating daily  . History of colon polyps 2007   Dr Sharlett Iles  . 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 Hx of colonic polyps    Dr Sharlett Iles  . Hx of migraines    yrs ago   . Hypertension    takes  Benazepril,Amlodipine,and Metoprolol daily  . Osteoporosis   . PONV (postoperative nausea and vomiting)   . Primary localized osteoarthritis of left knee   . PVD (peripheral vascular disease) ([place]) 1965, 2001   abnormal venous doppler findings due to recurrent DVT'S left leg  . Right knee DJD    knees  . Shortness of breath dyspnea   . Vitamin B12 deficiency (non anemic) 01/24/2015  . Vitamin D deficiency    takes VIt D daily    Past Surgical History:  Procedure Laterality Date  . ABDOMINAL HYSTERECTOMY    . APPENDECTOMY  1965  . BREAST LUMPECTOMY  1987  . CESAREAN SECTION  1961/1965/1966   X 3  . CHOLECYSTECTOMY  1997  . COLONOSCOPY W/ POLYPECTOMY  2007   Dr  Sharlett Iles; due? 2012  . DILATION AND CURETTAGE OF UTERUS  2012   uterine polyp, Dr Kennon Rounds  . ESOPHAGOGASTRODUODENOSCOPY  1997  . HYSTEROSCOPY W/D&C N/A 08/05/2012   Procedure: DILATATION AND CURETTAGE /HYSTEROSCOPY;  Surgeon: Donnamae Jude, MD;  Location: [facility];  Service: Gynecology;  Laterality: N/A;  . ROBOTIC ASSISTED TOTAL HYSTERECTOMY WITH BILATERAL SALPINGO OOPHERECTOMY Right 09/14/2012   Procedure: ROBOTIC ASSISTED TOTAL HYSTERECTOMY WITH BILATERAL SALPINGO OOPHORECTOMY ,right pelvic LYMPHNODE dissection;  Surgeon: Imagene Gurney A. Alycia Rossetti, MD;  Location: [facility];  Service: Gynecology;  Laterality: Right;  . TOTAL KNEE ARTHROPLASTY  08/11/2011   Procedure: TOTAL KNEE ARTHROPLASTY;  Surgeon: Lorn Junes, MD;  Location: [facility];  Service: Orthopedics;  Laterality: Right;  DR Glasgow THIS CASE  . TOTAL KNEE ARTHROPLASTY Left 09/11/2014  . TOTAL KNEE ARTHROPLASTY Left 09/11/2014   Procedure: TOTAL KNEE ARTHROPLASTY;  Surgeon: Elsie Saas, MD;  Location: [facility];  Service: Orthopedics;  Laterality: Left;  . WISDOM TOOTH EXTRACTION      Social History   Socioeconomic History  . Marital status: Divorced    Spouse name: Not on file  . Number of children: 1  . Years of education: Not on file  . Highest  education level: Not on file  Occupational History  . Not on file  Social Needs  . Financial resource strain: Not hard at all  . Food insecurity    Worry: Never true    Inability: Never true  . Transportation needs    Medical: No    Non-medical: No  Tobacco Use  . Smoking status: Never Smoker  . Smokeless tobacco: Never Used  Substance and Sexual Activity  . Alcohol use: No  . Drug use: No  . Sexual activity: Never    Birth control/protection: Post-menopausal    Comment: retired. has 2 daughters  Lifestyle  . Physical activity    Days per week: 0  days    Minutes per session: 0 min  . Stress: Not at all  Relationships  . Social connections    Talks on phone: More than three times a week    Gets together: More than three times a week    Attends religious service: Never    Active member of club or organization: Yes    Attends meetings of clubs or organizations: More than 4 times per year    Relationship status: Divorced  Other Topics Concern  . Not on file  Social History Narrative   No regular exercise    Family History  Problem Relation Age of Onset  . Kidney disease Mother        ? hypertensive  . Hypertension Mother   . Deep vein thrombosis Mother        post ankle fracture  . Leukemia Father   . Colon cancer Maternal Grandmother   . Diabetes Other        cousins  . Anesthesia problems Neg Hx   . Hypotension Neg Hx   . Malignant hyperthermia Neg Hx   . Pseudochol deficiency Neg Hx   . Stroke Neg Hx   . Heart disease Neg Hx     Review of Systems  Constitutional: Negative for chills and fever.  HENT: Negative for trouble swallowing.   Eyes: Negative for visual disturbance.  Respiratory: Negative for cough, shortness of breath and wheezing.   Cardiovascular: Negative for chest pain and palpitations.  Neurological: Positive for dizziness, speech difficulty and headaches (dull yesterday, this morning). Negative for weakness and numbness.   Psychiatric/Behavioral: Negative for confusion.       Objective:   Vitals:   01/03/19 1028  BP: 132/60  Pulse: 67  Resp: 16  Temp: 98.6 F (37 C)  SpO2: 98%   BP Readings from Last 3 Encounters:  01/03/19 132/60  08/20/18 (!) 142/62  06/29/18 (!) 154/72   Wt Readings from Last 3 Encounters:  01/03/19 179 lb 12.8 oz (81.6 kg)  08/20/18 183 lb 6.4 oz (83.2 kg)  06/29/18 185 lb 6.4 oz (84.1 kg)   Body mass index is 35.11 kg/m.   Physical Exam Constitutional:      Appearance: Normal appearance. She is not toxic-appearing or diaphoretic.  HENT:     Head: Normocephalic and atraumatic.  Neck:     Musculoskeletal: Neck supple. No neck rigidity or muscular tenderness.     Vascular: No carotid bruit.  Cardiovascular:     Rate and Rhythm: Normal rate and regular rhythm.     Heart sounds: No murmur.  Pulmonary:     Effort: Pulmonary effort is normal. No respiratory distress.     Breath sounds: Normal breath sounds. No wheezing or rales.  Musculoskeletal:     Right lower leg: No edema.     Left lower leg: No edema.  Lymphadenopathy:     Cervical: No cervical adenopathy.  Skin:    General: Skin is warm and dry.  Neurological:     General: No focal deficit present.     Mental Status: She is alert and oriented to person, place, and time.     Cranial Nerves: No cranial nerve deficit.     Sensory: No sensory deficit.     Motor: No weakness.     Coordination: Coordination normal.  Psychiatric:        Mood and Affect: Mood normal.              Assessment &  Plan:    See Problem List for Assessment and Plan of chronic medical problems.

## 2019-01-03 NOTE — Assessment & Plan Note (Signed)
Symptoms concerning for possible TIA Will refer to neurology for their evaluation and input Will increase simvastatin to 20 mg daily-we will recheck lipid panel at her appointment next month We will check carotid ultrasounds MRI brain We will hold off on echo, cardiology referral at this time She will call if she has another episode similar

## 2019-01-03 NOTE — Telephone Encounter (Signed)
Pt. called to report episode of flushing of face, elevated BP of 160/80 and difficulty finding words on Sat., 10/3 @ approx. 4:30 PM.  Stated the episode lasted about 5 min.  BP rechecked approx. 8:00 PM; 135/74.  C/o dull headache on Sat. and Sun.  Denied headache at present.  Denied weakness or numbness of extremities.  Denied vision loss, facial droop, or balance problems.  C/o intermittent dizziness.  Stated the dizziness is better today; reported she is "a little dizzy and flushed."  Unable to get BP reading this AM, due to machine malfunction.    Phone call to office; spoke with [facility].  Transferred pt. To the office for an appt. Today.  Pt. Agreed with plan.      Reason for Disposition . [1] Loss of speech or garbled speech AND [2] sudden onset AND [3] brief (now gone)  Answer Assessment - Initial Assessment Questions 1. SYMPTOM: "What is the main symptom you are concerned about?" (e.g., weakness, numbness)     A little dizziness at present and feels flushed.  2. ONSET: "When did this start?" (minutes, hours, days; while sleeping)     Saturday 10/3 at approx. 4:30 PM  3. LAST NORMAL: "When was the last time you were normal (no symptoms)?"     Saturday AM  4. PATTERN "Does this come and go, or has it been constant since it started?"  "Is it present now?"     *No Answer* 5. CARDIAC SYMPTOMS: "Have you had any of the following symptoms: chest pain, difficulty breathing, palpitations?"     Denied chest pain or shortness of breath 6. NEUROLOGIC SYMPTOMS: "Have you had any of the following symptoms: headache, dizziness, vision loss, double vision, changes in speech, unsteady on your feet?"    Some dizziness; dull headache yesterday, no vision changes, no facial droop, no balance problems, episode of difficulty with speech on Saturday for approx.5 min.; denied weakness or numbness of extremities.  7. OTHER SYMPTOMS: "Do you have any other symptoms?"    BP 160/80 @ 4:30 PM 10/3; BP 135/74 @ 8:00 PM  10/3 8. PREGNANCY: "Is there any chance you are pregnant?" "When was your last menstrual period?"    n/a  Protocols used: NEUROLOGIC DEFICIT-A-AH

## 2019-01-03 NOTE — Patient Instructions (Addendum)
An MRI and carotid artery ultrasound was ordered - someone will call you to schedule these.    Medications reviewed and updated.  Changes include :   Increase simvastatin to 20 mg daily.   A referral was ordered for neurology   Please followup as scheduled.

## 2019-01-05 ENCOUNTER — Ambulatory Visit (HOSPITAL_COMMUNITY)
Admission: RE | Admit: 2019-01-05 | Discharge: 2019-01-05 | Disposition: A | Discharge status: Home / Self Care | Payer: Medicare Other | Source: Ambulatory Visit | Attending: Cardiology | Admitting: Cardiology

## 2019-01-05 ENCOUNTER — Other Ambulatory Visit: Payer: Self-pay

## 2019-01-05 ENCOUNTER — Encounter: Payer: Self-pay | Admitting: Neurology

## 2019-01-05 DIAGNOSIS — G459 Transient cerebral ischemic attack, unspecified: Secondary | ICD-10-CM | POA: Diagnosis not present

## 2019-01-06 ENCOUNTER — Telehealth: Payer: Self-pay

## 2019-01-06 NOTE — Telephone Encounter (Signed)
Pt is asking about the MRI that was put in on 10/5. She is just wanting to make sure she gets it done before her neurology appointment on 10/28. Please advise.

## 2019-01-06 NOTE — Telephone Encounter (Signed)
Spoke to pt and gave her [facility] #

## 2019-01-11 ENCOUNTER — Ambulatory Visit (INDEPENDENT_AMBULATORY_CARE_PROVIDER_SITE_OTHER): Payer: Medicare Other | Admitting: *Deleted

## 2019-01-11 DIAGNOSIS — Z0000 Encounter for general adult medical examination without abnormal findings: Secondary | ICD-10-CM

## 2019-01-11 NOTE — Progress Notes (Addendum)
Subjective:   Bonnie Zhang is a 78 y.o. female who presents for Medicare Annual (Subsequent) preventive examination. I connected with patient by a telephone and verified that I am speaking with the correct person using two identifiers. Patient stated full name and DOB. Patient gave permission to continue with telephonic visit. Patient's location was at home and Nurse's location was at [place] office.   Review of Systems:   Cardiac Risk Factors include: advanced age (>13men, >44 women);dyslipidemia;hypertension Sleep patterns: feels rested on waking, gets up 1-2 times nightly to void and sleeps 6-7hours nightly.    Home Safety/Smoke Alarms: Feels safe in home. Smoke alarms in place.  Living environment; residence and Firearm Safety: 1-story house/ trailer. Lives alone, no needs for DME, good support system Seat Belt Safety/Bike Helmet: Wears seat belt.     Objective:     Vitals: There were no vitals taken for this visit.  There is no height or weight on file to calculate BMI.  Advanced Directives 01/11/2019 05/14/2018 01/07/2018 12/22/2016 09/12/2014 08/31/2014 06/22/2014  Does Patient Have a Medical Advance Directive? Yes Yes Yes Yes Yes Yes Yes  Type of Paramedic of [place];Living will Living will Living will [place];Living will [place];Living will Living will Living will  Does patient want to make changes to medical advance directive? - - - - No - Patient declined - -  Copy of [place] in Chart? No - copy requested - - No - copy requested Yes No - copy requested No - copy requested  Pre-existing out of facility DNR order (yellow form or pink MOST form) - - - - - - -    Tobacco Social History   Tobacco Use  Smoking Status Never Smoker  Smokeless Tobacco Never Used     Counseling given: Not Answered  Past Medical History:  Diagnosis Date  . Anticoagulated on Coumadin 2001   2 blood clots   . Bruises easily    pt is on Coumadin;last dose of Coumadin 08/05/11 and then Lovenox started 08/07/11  . DVT (deep vein thrombosis) in pregnancy     1965 post C section;2001 with prolonged driving while on  HRT  . DVT (deep venous thrombosis) ([place]) 2001   Was on Prempro.    Marland Kitchen GERD (gastroesophageal reflux disease)    Protonix prn  . High cholesterol    takes Simvastating daily  . History of colon polyps 2007   Dr Sharlett Iles  . 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 Hx of colonic polyps    Dr Sharlett Iles  . Hx of migraines    yrs ago   . Hypertension    takes Benazepril,Amlodipine,and Metoprolol daily  . Osteoporosis   . PONV (postoperative nausea and vomiting)   . Primary localized osteoarthritis of left knee   . PVD (peripheral vascular disease) ([place]) 1965, 2001   abnormal venous doppler findings due to recurrent DVT'S left leg  . Right knee DJD    knees  . Shortness of breath dyspnea   . Vitamin B12 deficiency (non anemic) 01/24/2015  . Vitamin D deficiency    takes VIt D daily   Past Surgical History:  Procedure Laterality Date  . ABDOMINAL HYSTERECTOMY    . APPENDECTOMY  1965  . BREAST LUMPECTOMY  1987  . CESAREAN SECTION  1961/1965/1966   X 3  . CHOLECYSTECTOMY  1997  . COLONOSCOPY W/ POLYPECTOMY  2007   Dr  Sharlett Iles; due? 2012  .  DILATION AND CURETTAGE OF UTERUS  2012   uterine polyp, Dr Kennon Rounds  . ESOPHAGOGASTRODUODENOSCOPY  1997  . HYSTEROSCOPY W/D&C N/A 08/05/2012   Procedure: DILATATION AND CURETTAGE /HYSTEROSCOPY;  Surgeon: Donnamae Jude, MD;  Location: [facility];  Service: Gynecology;  Laterality: N/A;  . ROBOTIC ASSISTED TOTAL HYSTERECTOMY WITH BILATERAL SALPINGO OOPHERECTOMY Right 09/14/2012   Procedure: ROBOTIC ASSISTED TOTAL HYSTERECTOMY WITH BILATERAL SALPINGO OOPHORECTOMY ,right pelvic LYMPHNODE dissection;  Surgeon: Imagene Gurney A. Alycia Rossetti, MD;  Location: [facility];  Service: Gynecology;  Laterality: Right;  . TOTAL KNEE ARTHROPLASTY  08/11/2011   Procedure: TOTAL KNEE  ARTHROPLASTY;  Surgeon: Lorn Junes, MD;  Location: [facility];  Service: Orthopedics;  Laterality: Right;  DR [place] THIS CASE  . TOTAL KNEE ARTHROPLASTY Left 09/11/2014  . TOTAL KNEE ARTHROPLASTY Left 09/11/2014   Procedure: TOTAL KNEE ARTHROPLASTY;  Surgeon: Elsie Saas, MD;  Location: [facility];  Service: Orthopedics;  Laterality: Left;  . WISDOM TOOTH EXTRACTION     Family History  Problem Relation Age of Onset  . Kidney disease Mother        ? hypertensive  . Hypertension Mother   . Deep vein thrombosis Mother        post ankle fracture  . Leukemia Father   . Colon cancer Maternal Grandmother   . Diabetes Other        cousins  . Anesthesia problems Neg Hx   . Hypotension Neg Hx   . Malignant hyperthermia Neg Hx   . Pseudochol deficiency Neg Hx   . Stroke Neg Hx   . Heart disease Neg Hx    Social History   Socioeconomic History  . Marital status: Divorced    Spouse name: Not on file  . Number of children: 2  . Years of education: Not on file  . Highest education level: Not on file  Occupational History  . Occupation: Retired  Scientific laboratory technician  . Financial resource strain: Not hard at all  . Food insecurity    Worry: Never true    Inability: Never true  . Transportation needs    Medical: No    Non-medical: No  Tobacco Use  . Smoking status: Never Smoker  . Smokeless tobacco: Never Used  Substance and Sexual Activity  . Alcohol use: No  . Drug use: No  . Sexual activity: Never    Birth control/protection: Post-menopausal    Comment: retired. has 2 daughters  Lifestyle  . Physical activity    Days per week: 0 days    Minutes per session: 0 min  . Stress: Not at all  Relationships  . Social connections    Talks on phone: More than three times a week    Gets together: More than three times a week    Attends religious service: More than 4 times per year    Active member of club or organization: Yes    Attends meetings of clubs or  organizations: More than 4 times per year    Relationship status: Divorced  Other Topics Concern  . Not on file  Social History Narrative   No regular exercise    Outpatient Encounter Medications as of 01/11/2019  Medication Sig  . acetaminophen (TYLENOL) 500 MG tablet Take 500 mg by mouth every 6 (six) hours as needed.  Marland Kitchen amLODipine (NORVASC) 10 MG tablet Take 1 tablet (10 mg total) by mouth daily.  . benazepril (LOTENSIN) 40 MG tablet TAKE 1 TABLET BY MOUTH DAILY.  Marland Kitchen  meclizine (ANTIVERT) 25 MG tablet Take 1 tablet (25 mg total) by mouth 3 (three) times daily as needed for dizziness.  . metoprolol succinate (TOPROL-XL) 50 MG 24 hr tablet Take 1 tablet (50 mg total) by mouth daily. Take with or immediately following a meal.  . pantoprazole (PROTONIX) 40 MG tablet TAKE 1 TABLET BY MOUTH ONCE DAILY AS NEEDED FOR  ACID  REFLUX  . simvastatin (ZOCOR) 20 MG tablet Take 1 tablet (20 mg total) by mouth at bedtime.  Marland Kitchen spironolactone (ALDACTONE) 25 MG tablet Take 0.5 tablets (12.5 mg total) by mouth daily.  Marland Kitchen warfarin (COUMADIN) 5 MG tablet Take 1 tablet daily or take as directed by anticoagulation clinic   No facility-administered encounter medications on file as of 01/11/2019.     Activities of Daily Living In your present state of health, do you have any difficulty performing the following activities: 01/11/2019  Hearing? N  Vision? N  Difficulty concentrating or making decisions? N  Walking or climbing stairs? N  Dressing or bathing? N  Doing errands, shopping? N  Preparing Food and eating ? N  Using the Toilet? N  In the past six months, have you accidently leaked urine? N  Do you have problems with loss of bowel control? N  Managing your Medications? N  Managing your Finances? N  Housekeeping or managing your Housekeeping? N  Some recent data might be hidden    Patient Care Team: [name], MD as PCP - General (Internal Medicine) Josue Hector, MD as Consulting  Physician (Cardiology) Heath Lark, MD as Consulting Physician (Hematology and Oncology) Elsie Saas, MD as Consulting Physician (Orthopedic Surgery) Pieter Partridge, DO as Consulting Physician (Neurology)    Assessment:   This is a routine wellness examination for [facility]. Physical assessment deferred to PCP.   Exercise Activities and Dietary recommendations Current Exercise Habits: The patient does not participate in regular exercise at present, Exercise limited by: orthopedic condition(s)  Diet (meal preparation, eat out, water intake, caffeinated beverages, dairy products, fruits and vegetables): in general, a "healthy" diet  , well balanced   Reviewed heart healthy and diabetic diet. Encouraged patient to increase daily water and healthy fluid intake.  Goals    . Be healthy, as active and as independent as possible    . Patient Stated     Stay physically and socially active, enjoy life and family.       Fall Risk Fall Risk  01/11/2019 08/20/2018 01/07/2018 12/29/2017 12/29/2016  Falls in the past year? 0 0 Yes No No  Number falls in past yr: 0 0 1 - -  Injury with Fall? 0 - No - -  Follow up - - Falls prevention discussed - -   Is the patient's home free of loose throw rugs in walkways, pet beds, electrical cords, etc?   yes      Grab bars in the bathroom? yes      Handrails on the stairs?   yes      Adequate lighting?   yes  Depression Screen PHQ 2/9 Scores 01/11/2019 08/20/2018 01/07/2018 12/29/2017  PHQ - 2 Score 0 0 0 0  PHQ- 9 Score - - 0 3     Cognitive Function MMSE - Mini Mental State Exam 01/07/2018 12/22/2016  Orientation to time 5 5  Orientation to Place 5 5  Registration 3 3  Attention/ Calculation 5 5  Recall 3 2  Language- name 2 objects 2 2  Language- repeat 1  1  Language- follow 3 step command 3 3  Language- read & follow direction 1 1  Write a sentence 1 1  Copy design 1 1  Total score 30 29       Ad8 score reviewed for issues:  Issues  making decisions: no  Less interest in hobbies / activities: no  Repeats questions, stories (family complaining): no  Trouble using ordinary gadgets (microwave, computer, phone):no  Forgets the month or year: no  Mismanaging finances: no  Remembering appts: no  Daily problems with thinking and/or memory: no Ad8 score is= 0  Immunization History  Administered Date(s) Administered  . Td 03/21/2010     Screening Tests Health Maintenance  Topic Date Due  . PNA vac Low Risk Adult (1 of 2 - PCV13) 08/29/2006  . INFLUENZA VACCINE  06/29/2019 (Originally 10/30/2018)  . DEXA SCAN  02/20/2020 (Originally 07/12/2018)  . TETANUS/TDAP  03/21/2020      Plan:   Reviewed health maintenance screenings with patient today and relevant education, vaccines, and/or referrals were provided.   Continue to eat heart healthy diet (full of fruits, vegetables, whole grains, lean protein, water--limit salt, fat, and sugar intake) and increase physical activity as tolerated.  Continue doing brain stimulating activities (puzzles, reading, adult coloring books, staying active) to keep memory sharp.   I have personally reviewed and noted the following in the patient's chart:   . Medical and social history . Use of alcohol, tobacco or illicit drugs  . Current medications and supplements . Functional ability and status . Nutritional status . Physical activity . Advanced directives . List of other physicians . Screenings to include cognitive, depression, and falls . Referrals and appointments  In addition, I have reviewed and discussed with patient certain preventive protocols, quality metrics, and best practice recommendations. A written personalized care plan for preventive services as well as general preventive health recommendations were provided to patient.     Michiel Cowboy, RN  01/11/2019   Medical screening examination/treatment/procedure(s) were performed by non-physician practitioner and  as supervising physician I was immediately available for consultation/collaboration. I agree with above. [name], MD

## 2019-01-18 ENCOUNTER — Other Ambulatory Visit: Payer: Self-pay

## 2019-01-18 ENCOUNTER — Ambulatory Visit (INDEPENDENT_AMBULATORY_CARE_PROVIDER_SITE_OTHER): Payer: Medicare Other | Admitting: General Practice

## 2019-01-18 DIAGNOSIS — Z7901 Long term (current) use of anticoagulants: Secondary | ICD-10-CM | POA: Diagnosis not present

## 2019-01-18 DIAGNOSIS — Z86718 Personal history of other venous thrombosis and embolism: Secondary | ICD-10-CM

## 2019-01-18 LAB — POCT INR: INR: 2.8 (ref 2.0–3.0)

## 2019-01-18 NOTE — Progress Notes (Signed)
Agree with management.  Stacy J Burns, MD  

## 2019-01-18 NOTE — Patient Instructions (Addendum)
Pre visit review using our clinic review tool, if applicable. No additional management support is needed unless otherwise documented below in the visit note.  Continue to take 1/2 tablet daily except 1 tablet on Mondays only.  Re-check in 4 weeks.

## 2019-01-23 ENCOUNTER — Ambulatory Visit
Admission: RE | Admit: 2019-01-23 | Discharge: 2019-01-23 | Disposition: A | Discharge status: Home / Self Care | Payer: Medicare Other | Source: Ambulatory Visit | Attending: Internal Medicine | Admitting: Internal Medicine

## 2019-01-23 ENCOUNTER — Other Ambulatory Visit: Payer: Self-pay

## 2019-01-23 DIAGNOSIS — G459 Transient cerebral ischemic attack, unspecified: Secondary | ICD-10-CM

## 2019-01-23 MED ORDER — GADOBENATE DIMEGLUMINE 529 MG/ML IV SOLN
15.0000 mL | Freq: Once | INTRAVENOUS | Status: AC | PRN
  Administered 2019-01-23: 15 mL via INTRAVENOUS

## 2019-01-25 ENCOUNTER — Other Ambulatory Visit: Payer: Self-pay | Admitting: Internal Medicine

## 2019-01-25 ENCOUNTER — Encounter: Payer: Self-pay | Admitting: Internal Medicine

## 2019-01-25 DIAGNOSIS — R930 Abnormal findings on diagnostic imaging of skull and head, not elsewhere classified: Secondary | ICD-10-CM

## 2019-01-25 NOTE — Progress Notes (Signed)
NEUROLOGY CONSULTATION NOTE  Bonnie Zhang MRN: [id number] DOB: 03/21/1941  Referring provider: Billey Gosling, MD Primary care provider: Billey Gosling, MD  Reason for consult:  TIA  HISTORY OF PRESENT ILLNESS: Bonnie Schmeer. 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  History supplemented by ED and referring providers notes.  She presented to the ED at [facility] on 05/14/2018 for 2 days of intermittent dizziness, described as lightheadedness lasting a day off and on.  It usually occurs upon standing up.  She had an associated flushing and dull non-throbbing frontal headache.  CT of head personally reviewed and demonstrated no acute intracranial abnormality.  Vitals were stable and neurologic exam was unremarkable.  CBC, electrolytes, troponin and EKG were largely unremarkable.  She was diagnosed with vertigo and discharged with instructions to follow up with her PCP.  Earlier this month, she began feeling flushed with dizziness and headache again.  This was different because she had trouble getting words out for 3 minutes (jumbled words).  She checked her blood pressure, which was 161/84.  She took meclizine which helps a little bit.  No visual disturbance or unilateral numbness or weakness.  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    She reports remote history of vertigo described as spinning.  She also reports history of headaches consistent with migraines.    PAST MEDICAL HISTORY: Past Medical History:  Diagnosis Date  . Anticoagulated on Coumadin 2001   2 blood clots  . Bruises easily    pt is on Coumadin;last dose of Coumadin  08/05/11 and then Lovenox started 08/07/11  . DVT (deep vein thrombosis) in pregnancy     1965 post C section;2001 with prolonged driving while on  HRT  . DVT (deep venous thrombosis) ([place]) 2001   Was on Prempro.    Marland Kitchen GERD (gastroesophageal reflux disease)    Protonix prn  . High cholesterol    takes Simvastating daily  . History of colon polyps 2007   Dr Sharlett Iles  . 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 Hx of colonic polyps    Dr Sharlett Iles  . Hx of migraines    yrs ago   . Hypertension    takes Benazepril,Amlodipine,and Metoprolol daily  . Osteoporosis   . PONV (postoperative nausea and vomiting)   . Primary localized osteoarthritis of left knee   . PVD (peripheral vascular disease) ([place]) 1965, 2001   abnormal venous doppler findings due to recurrent DVT'S left leg  . Right knee DJD    knees  . Shortness of breath dyspnea   . Vitamin B12 deficiency (non anemic) 01/24/2015  . Vitamin D deficiency    takes VIt D daily    PAST SURGICAL HISTORY: Past Surgical History:  Procedure Laterality Date  . ABDOMINAL HYSTERECTOMY    . APPENDECTOMY  1965  . BREAST LUMPECTOMY  1987  . CESAREAN SECTION  1961/1965/1966   X 3  . CHOLECYSTECTOMY  1997  . COLONOSCOPY W/ POLYPECTOMY  2007   Dr  Sharlett Iles; due? 2012  . DILATION AND CURETTAGE OF UTERUS  2012   uterine polyp, Dr Kennon Rounds  . ESOPHAGOGASTRODUODENOSCOPY  1997  . HYSTEROSCOPY W/D&C N/A 08/05/2012  Procedure: DILATATION AND CURETTAGE /HYSTEROSCOPY;  Surgeon: Donnamae Jude, MD;  Location: [facility];  Service: Gynecology;  Laterality: N/A;  . ROBOTIC ASSISTED TOTAL HYSTERECTOMY WITH BILATERAL SALPINGO OOPHERECTOMY Right 09/14/2012   Procedure: ROBOTIC ASSISTED TOTAL HYSTERECTOMY WITH BILATERAL SALPINGO OOPHORECTOMY ,right pelvic LYMPHNODE dissection;  Surgeon: Imagene Gurney A. Alycia Rossetti, MD;  Location: [facility];  Service: Gynecology;  Laterality: Right;  . TOTAL KNEE ARTHROPLASTY  08/11/2011   Procedure: TOTAL KNEE ARTHROPLASTY;  Surgeon: Lorn Junes,  MD;  Location: [facility];  Service: Orthopedics;  Laterality: Right;  DR [place] THIS CASE  . TOTAL KNEE ARTHROPLASTY Left 09/11/2014  . TOTAL KNEE ARTHROPLASTY Left 09/11/2014   Procedure: TOTAL KNEE ARTHROPLASTY;  Surgeon: Elsie Saas, MD;  Location: [facility];  Service: Orthopedics;  Laterality: Left;  . WISDOM TOOTH EXTRACTION      MEDICATIONS: Current Outpatient Medications on File Prior to Visit  Medication Sig Dispense Refill  . acetaminophen (TYLENOL) 500 MG tablet Take 500 mg by mouth every 6 (six) hours as needed.    Marland Kitchen amLODipine (NORVASC) 10 MG tablet Take 1 tablet (10 mg total) by mouth daily. 90 tablet 1  . benazepril (LOTENSIN) 40 MG tablet TAKE 1 TABLET BY MOUTH DAILY. 90 tablet 1  . meclizine (ANTIVERT) 25 MG tablet Take 1 tablet (25 mg total) by mouth 3 (three) times daily as needed for dizziness. 30 tablet 0  . metoprolol succinate (TOPROL-XL) 50 MG 24 hr tablet Take 1 tablet (50 mg total) by mouth daily. Take with or immediately following a meal. 90 tablet 3  . pantoprazole (PROTONIX) 40 MG tablet TAKE 1 TABLET BY MOUTH ONCE DAILY AS NEEDED FOR  ACID  REFLUX 90 tablet 0  . simvastatin (ZOCOR) 20 MG tablet Take 1 tablet (20 mg total) by mouth at bedtime. 90 tablet 3  . spironolactone (ALDACTONE) 25 MG tablet Take 0.5 tablets (12.5 mg total) by mouth daily. 15 tablet 5  . warfarin (COUMADIN) 5 MG tablet Take 1 tablet daily or take as directed by anticoagulation clinic 90 tablet 1   No current facility-administered medications on file prior to visit.     ALLERGIES: Allergies  Allergen Reactions  . Vicodin [Hydrocodone-Acetaminophen]     Nightmares  . Aspirin Other (See Comments)    upset stomach  . Atorvastatin Other (See Comments)    leg pain  . Hctz [Hydrochlorothiazide] Other (See Comments)    FATIGUE AND EXTREMELY LOW POTASSIUM    FAMILY HISTORY: Family History  Problem Relation Age of Onset  . Kidney disease Mother        ? hypertensive  .  Hypertension Mother   . Deep vein thrombosis Mother        post ankle fracture  . Leukemia Father   . Colon cancer Maternal Grandmother   . Diabetes Other        cousins  . Anesthesia problems Neg Hx   . Hypotension Neg Hx   . Malignant hyperthermia Neg Hx   . Pseudochol deficiency Neg Hx   . Stroke Neg Hx   . Heart disease Neg Hx    SOCIAL HISTORY: Social History   Socioeconomic History  . Marital status: Divorced    Spouse name: Not on file  . Number of children: 2  . Years of education: Not on file  . Highest education level: Not on file  Occupational History  . Occupation: Retired  Scientific laboratory technician  . Financial resource strain: Not hard at all  .  Food insecurity    Worry: Never true    Inability: Never true  . Transportation needs    Medical: No    Non-medical: No  Tobacco Use  . Smoking status: Never Smoker  . Smokeless tobacco: Never Used  Substance and Sexual Activity  . Alcohol use: No  . Drug use: No  . Sexual activity: Never    Birth control/protection: Post-menopausal    Comment: retired. has 2 daughters  Lifestyle  . Physical activity    Days per week: 0 days    Minutes per session: 0 min  . Stress: Not at all  Relationships  . Social connections    Talks on phone: More than three times a week    Gets together: More than three times a week    Attends religious service: More than 4 times per year    Active member of club or organization: Yes    Attends meetings of clubs or organizations: More than 4 times per year    Relationship status: Divorced  . Intimate partner violence    Fear of current or ex partner: Not on file    Emotionally abused: Not on file    Physically abused: Not on file    Forced sexual activity: Not on file  Other Topics Concern  . Not on file  Social History Narrative   No regular exercise    REVIEW OF SYSTEMS: Constitutional: No fevers, chills, or sweats, no generalized fatigue, change in appetite Eyes: No visual changes,  double vision, eye pain Ear, nose and throat: No hearing loss, ear pain, nasal congestion, sore throat Cardiovascular: No chest pain, palpitations Respiratory:  No shortness of breath at rest or with exertion, wheezes GastrointestinaI: No nausea, vomiting, diarrhea, abdominal pain, fecal incontinence Genitourinary:  No dysuria, urinary retention or frequency Musculoskeletal:  No neck pain, back pain Integumentary: No rash, pruritus, skin lesions Neurological: as above Psychiatric: No depression, insomnia, anxiety Endocrine: No palpitations, fatigue, diaphoresis, mood swings, change in appetite, change in weight, increased thirst Hematologic/Lymphatic:  No purpura, petechiae. Allergic/Immunologic: no itchy/runny eyes, nasal congestion, recent allergic reactions, rashes  PHYSICAL EXAM: Blood pressure 128/77, pulse 65, height 5\' 1"  (1.549 m), weight 176 lb 9.6 oz (80.1 kg), SpO2 99 %. General: No acute distress.  Patient appears well-groomed.  Head:  Normocephalic/atraumatic Eyes:  fundi examined but not visualized Neck: supple, no paraspinal tenderness, full range of motion Back: No paraspinal tenderness Heart: regular rate and rhythm Lungs: Clear to auscultation bilaterally. Vascular: No carotid bruits. Neurological Exam: Mental status: alert and oriented to person, place, and time, recent and remote memory intact, fund of knowledge intact, attention and concentration intact, speech fluent and not dysarthric, language intact. Cranial nerves: CN I: not tested CN II: pupils equal, round and reactive to light, visual fields intact CN III, IV, VI:  full range of motion, no nystagmus, no ptosis CN V: facial sensation intact CN VII: upper and lower face symmetric CN VIII: hearing intact CN IX, X: gag intact, uvula midline CN XI: sternocleidomastoid and trapezius muscles intact CN XII: tongue midline Bulk & Tone: normal, no fasciculations. Motor:  5/5 throughout  Sensation: temperature  and vibration sensation intact. Deep Tendon Reflexes:  2+ throughout, toes downgoing.  Finger to nose testing:  Without dysmetria.  Heel to shin:  Without dysmetria.  Gait:  Normal station and stride.  Romberg negative.  IMPRESSION: 1.  Transient ischemic attack vs migraine.  I wouldn't make any changes to medication.  She is on  warfarin.  She is on statin therapy and LDL level (at least from last year) was at goal (less than 70).  Carotid doppler was already performed.  I don't think we need to pursue cardiac testing (echocardiogram, holter) since she is already on anticoagulation. 2.  Mass lesion of right cavernous carotid.  Incidental finding.  I don't think it is related to her episode.  She does not exhibit any signs or symptoms of cavernous sinus syndrome.  Specific impression could not be determined on MRI (broad differential:  Lymphoma, metastatic disease, pseudotumor, Lisette Abu).  PLAN: 1.  Continue warfarin, statin therapy (LDL goal less than 70), blood pressure control 2.  She has appointment with neurosurgery tomorrow. May need resection and biopsy. 3.  Follow up with me in 3 to 4 months.  Thank you for allowing me to take part in the care of this patient.  Metta Clines, DO  CC:  Billey Gosling, MD

## 2019-01-26 ENCOUNTER — Ambulatory Visit (INDEPENDENT_AMBULATORY_CARE_PROVIDER_SITE_OTHER): Payer: Medicare Other | Admitting: Neurology

## 2019-01-26 ENCOUNTER — Other Ambulatory Visit: Payer: Self-pay

## 2019-01-26 ENCOUNTER — Encounter: Payer: Self-pay | Admitting: Neurology

## 2019-01-26 VITALS — BP 128/77 | HR 65 | Ht 61.0 in | Wt 176.6 lb

## 2019-01-26 DIAGNOSIS — G459 Transient cerebral ischemic attack, unspecified: Secondary | ICD-10-CM | POA: Diagnosis not present

## 2019-01-26 DIAGNOSIS — G9389 Other specified disorders of brain: Secondary | ICD-10-CM

## 2019-01-26 NOTE — Patient Instructions (Signed)
1.  The dizzy spell/speech abnormality may have been a TIA.  It may also have been a migraine.  However, you are already on the medications required to help prevent stroke and I think Dr. Quay Burow already had the necessary tests performed.  2.  There is a mass in one of the arteries in your head.  We cannot really tell specifically what it is based on appearance on MRI.  The next step is seeing neurosurgery to see if biopsy/resection is warranted.  3.  Follow up with me in 3 to 4 months.

## 2019-01-27 DIAGNOSIS — H499 Unspecified paralytic strabismus: Secondary | ICD-10-CM | POA: Diagnosis not present

## 2019-01-27 DIAGNOSIS — I10 Essential (primary) hypertension: Secondary | ICD-10-CM | POA: Diagnosis not present

## 2019-02-08 ENCOUNTER — Other Ambulatory Visit: Payer: Self-pay | Admitting: Internal Medicine

## 2019-02-15 ENCOUNTER — Ambulatory Visit (INDEPENDENT_AMBULATORY_CARE_PROVIDER_SITE_OTHER): Payer: Medicare Other | Admitting: General Practice

## 2019-02-15 ENCOUNTER — Other Ambulatory Visit: Payer: Self-pay

## 2019-02-15 DIAGNOSIS — Z7901 Long term (current) use of anticoagulants: Secondary | ICD-10-CM

## 2019-02-15 DIAGNOSIS — Z86718 Personal history of other venous thrombosis and embolism: Secondary | ICD-10-CM

## 2019-02-15 LAB — POCT INR: INR: 2.2 (ref 2.0–3.0)

## 2019-02-15 NOTE — Progress Notes (Signed)
Agree with management.  Tremell Reimers J Cadince Hilscher, MD  

## 2019-02-15 NOTE — Patient Instructions (Addendum)
Pre visit review using our clinic review tool, if applicable. No additional management support is needed unless otherwise documented below in the visit note.  Continue to take 1/2 tablet daily except 1 tablet on Mondays only.  Re-check in 6 weeks.

## 2019-02-21 ENCOUNTER — Ambulatory Visit: Payer: Medicare Other | Admitting: Internal Medicine

## 2019-03-02 NOTE — Patient Instructions (Addendum)
  Tests ordered today. Your results will be released to MyChart (or called to you) after review.  If any changes need to be made, you will be notified at that same time.   Medications reviewed and updated.  Changes include :   none  Your prescription(s) have been submitted to your pharmacy. Please take as directed and contact our office if you believe you are having problem(s) with the medication(s).    Please followup in 6 months   

## 2019-03-02 NOTE — Progress Notes (Signed)
Subjective:    Patient ID: Bonnie Zhang, female    DOB: 24-Jun-1940, 78 y.o.   MRN: [id number]  HPI The patient is here for follow up.  She is not exercising regularly - walking a little.    Hypertension: She is taking her medication daily. She is compliant with a low sodium diet.  She denies chest pain, palpitations, edema, shortness of breath and regular headaches. She does not monitor her blood pressure at home.    Hyperlipidemia: She is taking her medication daily. She is compliant with a low fat/cholesterol diet. She denies myalgias.   GERD:  She is taking her medication daily as needed only.  She denies any GERD symptoms and feels her GERD is well controlled.   Hyperglycemia:  She is compliant with a low sugar/carbohydrate diet.  She is exercising some.  Factor V leiden carrier, lifelong a/c, h/o DVT:  She takes warfarin daily. She denies any abnormal bleeding.        Medications and allergies reviewed with patient and updated if appropriate.  Patient Active Problem List   Diagnosis Date Noted  . TIA (transient ischemic attack) 01/03/2019  . Hyperglycemia 06/29/2017  . Vertigo 04/07/2017  . Long term (current) use of anticoagulants 01/07/2017  . Carotid artery disease ([place]) 02/01/2016  . Bilateral leg edema 06/21/2015  . Vitamin B12 deficiency (non anemic) 01/24/2015  . 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 Factor V Leiden carrier ([place]) 01/23/2015  . Primary localized osteoarthritis of left knee   . Encounter for therapeutic drug monitoring 06/07/2013  . GERD (gastroesophageal reflux disease)   . PVD (peripheral vascular disease) ([place])   . 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 10/09/2010  . Osteoporosis 03/21/2010  . History of colonic polyps 03/20/2008  . Vitamin D deficiency 08/25/2007  . HYPERLIPIDEMIA 03/18/2007  . Dysmetabolic syndrome X 123XX123  . Essential hypertension 03/18/2007  . DVT, HX OF 07/23/2006    Current Outpatient Medications on File Prior to Visit  Medication Sig Dispense Refill  . acetaminophen (TYLENOL) 500 MG tablet Take 500 mg by mouth every 6 (six) hours as needed.    . benazepril (LOTENSIN) 40 MG tablet TAKE 1 TABLET BY MOUTH DAILY. 90 tablet 1  . meclizine (ANTIVERT) 25 MG tablet Take 1 tablet (25 mg total) by mouth 3 (three) times daily as needed for dizziness. 30 tablet 0  . metoprolol succinate (TOPROL-XL) 50 MG 24 hr tablet Take 1 tablet (50 mg total) by mouth daily. Take with or immediately following a meal. 90 tablet 3  . pantoprazole (PROTONIX) 40 MG tablet TAKE 1 TABLET BY MOUTH ONCE DAILY AS NEEDED FOR  ACID  REFLUX 90 tablet 0  . spironolactone (ALDACTONE) 25 MG tablet Take 0.5 tablets (12.5 mg total) by mouth daily. Need office visit for more refills 15 tablet 0  . warfarin (COUMADIN) 5 MG tablet Take 1 tablet daily or take as directed by anticoagulation clinic 90 tablet 1   No current facility-administered medications on file prior to visit.     Past Medical History:  Diagnosis Date  . Anticoagulated on Coumadin 2001   2 blood clots  . Bruises easily    pt is on Coumadin;last dose of Coumadin 08/05/11 and then Lovenox started 08/07/11  . DVT (deep vein thrombosis) in pregnancy     1965 post C section;2001 with prolonged driving while on  HRT  . DVT (deep venous thrombosis) ([place]) 2001   Was on Prempro.    Marland Kitchen  GERD (gastroesophageal reflux disease)    Protonix prn  . High cholesterol    takes Simvastating daily  . History of colon polyps 2007   Dr Sharlett Iles  . 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 Hx of colonic polyps    Dr Sharlett Iles  . Hx of migraines    yrs ago   . Hypertension    takes Benazepril,Amlodipine,and Metoprolol daily  . Osteoporosis   . PONV (postoperative nausea and vomiting)   . Primary localized osteoarthritis of left knee   . PVD (peripheral vascular disease) ([place]) 1965, 2001   abnormal venous doppler findings due to recurrent DVT'S left  leg  . Right knee DJD    knees  . Shortness of breath dyspnea   . Vitamin B12 deficiency (non anemic) 01/24/2015  . Vitamin D deficiency    takes VIt D daily    Past Surgical History:  Procedure Laterality Date  . ABDOMINAL HYSTERECTOMY    . APPENDECTOMY  1965  . BREAST LUMPECTOMY  1987  . CESAREAN SECTION  1961/1965/1966   X 3  . CHOLECYSTECTOMY  1997  . COLONOSCOPY W/ POLYPECTOMY  2007   Dr  Sharlett Iles; due? 2012  . DILATION AND CURETTAGE OF UTERUS  2012   uterine polyp, Dr Kennon Rounds  . ESOPHAGOGASTRODUODENOSCOPY  1997  . HYSTEROSCOPY W/D&C N/A 08/05/2012   Procedure: DILATATION AND CURETTAGE /HYSTEROSCOPY;  Surgeon: Donnamae Jude, MD;  Location: [facility];  Service: Gynecology;  Laterality: N/A;  . ROBOTIC ASSISTED TOTAL HYSTERECTOMY WITH BILATERAL SALPINGO OOPHERECTOMY Right 09/14/2012   Procedure: ROBOTIC ASSISTED TOTAL HYSTERECTOMY WITH BILATERAL SALPINGO OOPHORECTOMY ,right pelvic LYMPHNODE dissection;  Surgeon: Imagene Gurney A. Alycia Rossetti, MD;  Location: [facility];  Service: Gynecology;  Laterality: Right;  . TOTAL KNEE ARTHROPLASTY  08/11/2011   Procedure: TOTAL KNEE ARTHROPLASTY;  Surgeon: Lorn Junes, MD;  Location: [facility];  Service: Orthopedics;  Laterality: Right;  DR [place] THIS CASE  . TOTAL KNEE ARTHROPLASTY Left 09/11/2014  . TOTAL KNEE ARTHROPLASTY Left 09/11/2014   Procedure: TOTAL KNEE ARTHROPLASTY;  Surgeon: Elsie Saas, MD;  Location: [facility];  Service: Orthopedics;  Laterality: Left;  . WISDOM TOOTH EXTRACTION      Social History   Socioeconomic History  . Marital status: Divorced    Spouse name: Not on file  . Number of children: 2  . Years of education: Not on file  . Highest education level: High school graduate  Occupational History  . Occupation: Retired  Scientific laboratory technician  . Financial resource strain: Not hard at all  . Food insecurity    Worry: Never true    Inability: Never true  . Transportation needs    Medical: No    Non-medical: No   Tobacco Use  . Smoking status: Never Smoker  . Smokeless tobacco: Never Used  Substance and Sexual Activity  . Alcohol use: No  . Drug use: No  . Sexual activity: Never    Birth control/protection: Post-menopausal    Comment: retired. has 2 daughters  Lifestyle  . Physical activity    Days per week: 0 days    Minutes per session: 0 min  . Stress: Not at all  Relationships  . Social connections    Talks on phone: More than three times a week    Gets together: More than three times a week    Attends religious service: More than 4 times per year    Active member of club or organization: Yes    Attends  meetings of clubs or organizations: More than 4 times per year    Relationship status: Divorced  Other Topics Concern  . Not on file  Social History Narrative   Pt lives alone she has 2 daughter - right handed- drinks tea, soda sometimes - No regular exercise    Family History  Problem Relation Age of Onset  . Kidney disease Mother        ? hypertensive  . Hypertension Mother   . Deep vein thrombosis Mother        post ankle fracture  . Leukemia Father   . Colon cancer Maternal Grandmother   . Diabetes Other        cousins  . Hypertension Sister   . Anesthesia problems Neg Hx   . Hypotension Neg Hx   . Malignant hyperthermia Neg Hx   . Pseudochol deficiency Neg Hx   . Stroke Neg Hx   . Heart disease Neg Hx     Review of Systems  Constitutional: Negative for chills and fever.  Eyes: Negative for visual disturbance.  Respiratory: Negative for cough, shortness of breath and wheezing.   Cardiovascular: Positive for leg swelling (left - from previous dvt). Negative for chest pain and palpitations.  Neurological: Positive for dizziness (occ, not new) and headaches (dull headache often). Negative for weakness, light-headedness and numbness.  Hematological: Does not bruise/bleed easily.       Objective:   Vitals:   03/03/19 0932  BP: 130/68  Pulse: 66  Temp: 98.2 F  (36.8 C)  SpO2: 98%   BP Readings from Last 3 Encounters:  03/03/19 130/68  01/26/19 128/77  01/03/19 132/60   Wt Readings from Last 3 Encounters:  03/03/19 179 lb (81.2 kg)  01/26/19 176 lb 9.6 oz (80.1 kg)  01/03/19 179 lb 12.8 oz (81.6 kg)   Body mass index is 33.82 kg/m.   Physical Exam    Constitutional: Appears well-developed and well-nourished. No distress.  HENT:  Head: Normocephalic and atraumatic.  Neck: Neck supple. No tracheal deviation present. No thyromegaly present.  No cervical lymphadenopathy Cardiovascular: Normal rate, regular rhythm and normal heart sounds.   No murmur heard. No carotid bruit .  LLE nonpitting edema -chronic from h/o DVT Pulmonary/Chest: Effort normal and breath sounds normal. No respiratory distress. No has no wheezes. No rales.  Skin: Skin is warm and dry. Not diaphoretic.  Psychiatric: Normal mood and affect. Behavior is normal.      Assessment & Plan:    See Problem List for Assessment and Plan of chronic medical problems.    This visit occurred during the SARS-CoV-2 public health emergency.  Safety protocols were in place, including screening questions prior to the visit, additional usage of staff PPE, and extensive cleaning of exam room while observing appropriate contact time as indicated for disinfecting solutions.

## 2019-03-03 ENCOUNTER — Other Ambulatory Visit: Payer: Self-pay

## 2019-03-03 ENCOUNTER — Ambulatory Visit (INDEPENDENT_AMBULATORY_CARE_PROVIDER_SITE_OTHER): Payer: Medicare Other | Admitting: Internal Medicine

## 2019-03-03 ENCOUNTER — Encounter: Payer: Self-pay | Admitting: Internal Medicine

## 2019-03-03 ENCOUNTER — Other Ambulatory Visit (INDEPENDENT_AMBULATORY_CARE_PROVIDER_SITE_OTHER): Payer: Medicare Other

## 2019-03-03 VITALS — BP 130/68 | HR 66 | Temp 98.2°F | Ht 61.0 in | Wt 179.0 lb

## 2019-03-03 DIAGNOSIS — R739 Hyperglycemia, unspecified: Secondary | ICD-10-CM

## 2019-03-03 DIAGNOSIS — E782 Mixed hyperlipidemia: Secondary | ICD-10-CM

## 2019-03-03 DIAGNOSIS — K219 Gastro-esophageal reflux disease without esophagitis: Secondary | ICD-10-CM | POA: Diagnosis not present

## 2019-03-03 DIAGNOSIS — D6851 Activated protein C resistance: Secondary | ICD-10-CM

## 2019-03-03 DIAGNOSIS — E7211 Homocystinuria: Secondary | ICD-10-CM

## 2019-03-03 DIAGNOSIS — I10 Essential (primary) hypertension: Secondary | ICD-10-CM

## 2019-03-03 DIAGNOSIS — R7983 Abnormal findings of blood amino-acid level: Secondary | ICD-10-CM

## 2019-03-03 DIAGNOSIS — Z86718 Personal history of other venous thrombosis and embolism: Secondary | ICD-10-CM

## 2019-03-03 LAB — CBC WITH DIFFERENTIAL/PLATELET
Basophils Absolute: 0 10*3/uL (ref 0.0–0.1)
Basophils Relative: 0.5 % (ref 0.0–3.0)
Eosinophils Absolute: 0.1 10*3/uL (ref 0.0–0.7)
Eosinophils Relative: 1.1 % (ref 0.0–5.0)
HCT: 42.5 % (ref 36.0–46.0)
Hemoglobin: 14.4 g/dL (ref 12.0–15.0)
Lymphocytes Relative: 34.3 % (ref 12.0–46.0)
Lymphs Abs: 2 10*3/uL (ref 0.7–4.0)
MCHC: 33.9 g/dL (ref 30.0–36.0)
MCV: 89.4 fl (ref 78.0–100.0)
Monocytes Absolute: 0.4 10*3/uL (ref 0.1–1.0)
Monocytes Relative: 7 % (ref 3.0–12.0)
Neutro Abs: 3.3 10*3/uL (ref 1.4–7.7)
Neutrophils Relative %: 57.1 % (ref 43.0–77.0)
Platelets: 190 10*3/uL (ref 150.0–400.0)
RBC: 4.75 Mil/uL (ref 3.87–5.11)
RDW: 12.8 % (ref 11.5–15.5)
WBC: 5.8 10*3/uL (ref 4.0–10.5)

## 2019-03-03 LAB — COMPREHENSIVE METABOLIC PANEL
ALT: 9 U/L (ref 0–35)
AST: 14 U/L (ref 0–37)
Albumin: 4.3 g/dL (ref 3.5–5.2)
Alkaline Phosphatase: 90 U/L (ref 39–117)
BUN: 9 mg/dL (ref 6–23)
CO2: 27 mEq/L (ref 19–32)
Calcium: 9.7 mg/dL (ref 8.4–10.5)
Chloride: 103 mEq/L (ref 96–112)
Creatinine, Ser: 0.91 mg/dL (ref 0.40–1.20)
GFR: 59.86 mL/min — ABNORMAL LOW (ref >60.00)
Glucose, Bld: 108 mg/dL — ABNORMAL HIGH (ref 70–99)
Potassium: 4 mEq/L (ref 3.5–5.1)
Sodium: 141 mEq/L (ref 135–145)
Total Bilirubin: 0.7 mg/dL (ref 0.2–1.2)
Total Protein: 7.5 g/dL (ref 6.0–8.3)

## 2019-03-03 LAB — VITAMIN B12: Vitamin B-12: 170 pg/mL — ABNORMAL LOW (ref 211–911)

## 2019-03-03 LAB — TSH: TSH: 1.29 u[IU]/mL (ref 0.35–4.50)

## 2019-03-03 LAB — LIPID PANEL
Cholesterol: 153 mg/dL (ref 0–200)
HDL: 59.9 mg/dL (ref >39.00)
LDL Cholesterol: 67 mg/dL (ref 0–99)
NonHDL: 93.58
Total CHOL/HDL Ratio: 3
Triglycerides: 135 mg/dL (ref 0.0–149.0)
VLDL: 27 mg/dL (ref 0.0–40.0)

## 2019-03-03 LAB — HEMOGLOBIN A1C: Hgb A1c MFr Bld: 5.3 % (ref 4.6–6.5)

## 2019-03-03 LAB — FOLATE: Folate: 8.2 ng/mL (ref >5.9)

## 2019-03-03 MED ORDER — SIMVASTATIN 10 MG PO TABS: 10.0000 mg | ORAL_TABLET | Freq: Every day | ORAL | 3 refills | Status: DC

## 2019-03-03 MED ORDER — AMLODIPINE BESYLATE 5 MG PO TABS: 5.0000 mg | ORAL_TABLET | Freq: Every day | ORAL | 1 refills | Status: DC

## 2019-03-03 NOTE — Assessment & Plan Note (Signed)
On warfarin Check folic acid level, 123456 level

## 2019-03-03 NOTE — Assessment & Plan Note (Signed)
GERD controlled Continue prn medication  

## 2019-03-03 NOTE — Assessment & Plan Note (Signed)
Lifelong warfarin Following with Cindy Cbc, cmp

## 2019-03-03 NOTE — Assessment & Plan Note (Signed)
BP well controlled Current regimen effective and well tolerated Continue current medications at current doses cmp  

## 2019-03-03 NOTE — Assessment & Plan Note (Signed)
a1c

## 2019-03-03 NOTE — Assessment & Plan Note (Signed)
Check lipid panel,cmp ,tsh Continue daily statin Regular exercise and healthy diet encouraged  

## 2019-03-03 NOTE — Assessment & Plan Note (Signed)
H/o DVT x 2 Lifelong warfarin

## 2019-03-04 ENCOUNTER — Other Ambulatory Visit: Payer: Self-pay | Admitting: Internal Medicine

## 2019-03-15 DIAGNOSIS — H35033 Hypertensive retinopathy, bilateral: Secondary | ICD-10-CM | POA: Diagnosis not present

## 2019-03-15 DIAGNOSIS — H04123 Dry eye syndrome of bilateral lacrimal glands: Secondary | ICD-10-CM | POA: Diagnosis not present

## 2019-03-15 DIAGNOSIS — H25813 Combined forms of age-related cataract, bilateral: Secondary | ICD-10-CM | POA: Diagnosis not present

## 2019-03-18 ENCOUNTER — Emergency Department (HOSPITAL_BASED_OUTPATIENT_CLINIC_OR_DEPARTMENT_OTHER): Payer: Medicare Other

## 2019-03-18 ENCOUNTER — Ambulatory Visit: Payer: Self-pay

## 2019-03-18 ENCOUNTER — Emergency Department (HOSPITAL_BASED_OUTPATIENT_CLINIC_OR_DEPARTMENT_OTHER)
Admission: EM | Admit: 2019-03-18 | Discharge: 2019-03-18 | Disposition: A | Discharge status: Home / Self Care | Payer: Medicare Other | Attending: Emergency Medicine | Admitting: Emergency Medicine

## 2019-03-18 ENCOUNTER — Other Ambulatory Visit: Payer: Self-pay

## 2019-03-18 ENCOUNTER — Encounter (HOSPITAL_BASED_OUTPATIENT_CLINIC_OR_DEPARTMENT_OTHER): Payer: Self-pay | Admitting: *Deleted

## 2019-03-18 DIAGNOSIS — Z8673 Personal history of transient ischemic attack (TIA), and cerebral infarction without residual deficits: Secondary | ICD-10-CM | POA: Insufficient documentation

## 2019-03-18 DIAGNOSIS — Z7901 Long term (current) use of anticoagulants: Secondary | ICD-10-CM | POA: Insufficient documentation

## 2019-03-18 DIAGNOSIS — R42 Dizziness and giddiness: Secondary | ICD-10-CM | POA: Diagnosis not present

## 2019-03-18 DIAGNOSIS — R519 Headache, unspecified: Secondary | ICD-10-CM | POA: Diagnosis not present

## 2019-03-18 DIAGNOSIS — Z79899 Other long term (current) drug therapy: Secondary | ICD-10-CM | POA: Diagnosis not present

## 2019-03-18 DIAGNOSIS — I10 Essential (primary) hypertension: Secondary | ICD-10-CM | POA: Diagnosis not present

## 2019-03-18 LAB — BASIC METABOLIC PANEL
Anion gap: 11 (ref 5–15)
BUN: 14 mg/dL (ref 8–23)
CO2: 26 mmol/L (ref 22–32)
Calcium: 10 mg/dL (ref 8.9–10.3)
Chloride: 103 mmol/L (ref 98–111)
Creatinine, Ser: 0.93 mg/dL (ref 0.44–1.00)
GFR calc Af Amer: >60 mL/min (ref >60)
GFR calc non Af Amer: 59 mL/min — ABNORMAL LOW (ref >60)
Glucose, Bld: 108 mg/dL — ABNORMAL HIGH (ref 70–99)
Potassium: 3.9 mmol/L (ref 3.5–5.1)
Sodium: 140 mmol/L (ref 135–145)

## 2019-03-18 LAB — CBC WITH DIFFERENTIAL/PLATELET
Abs Immature Granulocytes: 0.02 10*3/uL (ref 0.00–0.07)
Basophils Absolute: 0 10*3/uL (ref 0.0–0.1)
Basophils Relative: 0 %
Eosinophils Absolute: 0.1 10*3/uL (ref 0.0–0.5)
Eosinophils Relative: 1 %
HCT: 45.3 % (ref 36.0–46.0)
Hemoglobin: 14.9 g/dL (ref 12.0–15.0)
Immature Granulocytes: 0 %
Lymphocytes Relative: 38 %
Lymphs Abs: 2.5 10*3/uL (ref 0.7–4.0)
MCH: 29.9 pg (ref 26.0–34.0)
MCHC: 32.9 g/dL (ref 30.0–36.0)
MCV: 90.8 fL (ref 80.0–100.0)
Monocytes Absolute: 0.5 10*3/uL (ref 0.1–1.0)
Monocytes Relative: 7 %
Neutro Abs: 3.6 10*3/uL (ref 1.7–7.7)
Neutrophils Relative %: 54 %
Platelets: 188 10*3/uL (ref 150–400)
RBC: 4.99 MIL/uL (ref 3.87–5.11)
RDW: 12.5 % (ref 11.5–15.5)
WBC: 6.7 10*3/uL (ref 4.0–10.5)
nRBC: 0 % (ref 0.0–0.2)

## 2019-03-18 LAB — PROTIME-INR
INR: 2.4 — ABNORMAL HIGH (ref 0.8–1.2)
Prothrombin Time: 25.8 seconds — ABNORMAL HIGH (ref 11.4–15.2)

## 2019-03-18 NOTE — Discharge Instructions (Signed)
Take 10 mg norvasc daily for the next few days and see if that helps your blood pressure.

## 2019-03-18 NOTE — ED Provider Notes (Signed)
[facility] EMERGENCY DEPARTMENT Provider Note   CSN: [id number] Arrival date & time: 03/18/19  1806     History Chief Complaint  Patient presents with   Hypertension    Bonnie Zhang is a 78 y.o. female.  Pt presents to the ED today with high blood pressure and headache.  Pt said she's had flushing episodes over the last week associated with high blood pressure.  Her pcp has told her to take an extra 5 mg of norvasc when this occurs.  Today, she took the extra 5 mg and it did not not help.  She said the headache is very mild and she does not want anything for it.  Pt denies any cp or sob.        Past Medical History:  Diagnosis Date   Anticoagulated on Coumadin 2001   2 blood clots   Bruises easily    pt is on Coumadin;last dose of Coumadin 08/05/11 and then Lovenox started 08/07/11   DVT (deep vein thrombosis) in pregnancy     1965 post C section;2001 with prolonged driving while on  HRT   DVT (deep venous thrombosis) ([place]) 2001   Was on Prempro.     GERD (gastroesophageal reflux disease)    Protonix prn   High cholesterol    takes Simvastating daily   History of colon polyps 2007   Dr Sharlett Iles   Homocysteinemia [facility]) 01/23/2015   Hx of colonic polyps    Dr Sharlett Iles   Hx of migraines    yrs ago    Hypertension    takes Benazepril,Amlodipine,and Metoprolol daily   Osteoporosis    PONV (postoperative nausea and vomiting)    Primary localized osteoarthritis of left knee    PVD (peripheral vascular disease) ([place]) 1965, 2001   abnormal venous doppler findings due to recurrent DVT'S left leg   Right knee DJD    knees   Shortness of breath dyspnea    Vitamin B12 deficiency (non anemic) 01/24/2015   Vitamin D deficiency    takes VIt D daily    Patient Active Problem List   Diagnosis Date Noted   TIA (transient ischemic attack) 01/03/2019   Hyperglycemia 06/29/2017   Vertigo 04/07/2017   Long term (current) use of  anticoagulants 01/07/2017   Carotid artery disease ([place]) 02/01/2016   Bilateral leg edema 06/21/2015   Vitamin B12 deficiency (non anemic) 01/24/2015   Homocysteinemia ([place]) 01/23/2015   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 10/09/2010   Osteoporosis 03/21/2010   History of colonic polyps 03/20/2008   Vitamin D deficiency 08/25/2007   HYPERLIPIDEMIA 123XX123   Dysmetabolic syndrome X 123XX123   Essential hypertension 03/18/2007   DVT, HX OF 07/23/2006    Past Surgical History:  Procedure Laterality Date   [place]  1961/1965/1966   X 3   CHOLECYSTECTOMY  1997   COLONOSCOPY W/ POLYPECTOMY  2007   Dr  Sharlett Iles; due? 2012   [place]  2012   uterine polyp, Dr Kennon Rounds   ESOPHAGOGASTRODUODENOSCOPY  1997   HYSTEROSCOPY WITH D & C N/A 08/05/2012   Procedure: DILATATION AND CURETTAGE /HYSTEROSCOPY;  Surgeon: Donnamae Jude, MD;  Location: [facility];  Service: Gynecology;  Laterality: N/A;   ROBOTIC ASSISTED TOTAL HYSTERECTOMY WITH BILATERAL SALPINGO OOPHERECTOMY Right 09/14/2012   Procedure: ROBOTIC ASSISTED TOTAL HYSTERECTOMY WITH BILATERAL SALPINGO OOPHORECTOMY ,right pelvic LYMPHNODE dissection;  Surgeon: Imagene Gurney A. Alycia Rossetti, MD;  Location: [facility];  Service: Gynecology;  Laterality: Right;   TOTAL KNEE ARTHROPLASTY  08/11/2011   Procedure: TOTAL KNEE ARTHROPLASTY;  Surgeon: Lorn Junes, MD;  Location: [facility];  Service: Orthopedics;  Laterality: Right;  DR Noemi Chapel WANTS 90 MINUTES FOR THIS CASE   TOTAL KNEE ARTHROPLASTY Left 09/11/2014   TOTAL KNEE ARTHROPLASTY Left 09/11/2014   Procedure:  TOTAL KNEE ARTHROPLASTY;  Surgeon: Elsie Saas, MD;  Location: [facility];  Service: Orthopedics;  Laterality: Left;   WISDOM TOOTH EXTRACTION       OB History   No obstetric history on file.     Family History  Problem Relation Age of Onset   Kidney disease Mother        ? hypertensive   Hypertension Mother    Deep vein thrombosis Mother        post ankle fracture   Leukemia Father    Colon cancer Maternal Grandmother    Diabetes Other        cousins   Hypertension Sister    Anesthesia problems Neg Hx    Hypotension Neg Hx    Malignant hyperthermia Neg Hx    Pseudochol deficiency Neg Hx    Stroke Neg Hx    Heart disease Neg Hx     Social History   Tobacco Use   Smoking status: Never Smoker   Smokeless tobacco: Never Used  Substance Use Topics   Alcohol use: No   Drug use: No    Home Medications Prior to Admission medications   Medication Sig Start Date End Date Taking? Authorizing Provider  acetaminophen (TYLENOL) 500 MG tablet Take 500 mg by mouth every 6 (six) hours as needed.    [provider]  amLODipine (NORVASC) 5 MG tablet Take 1 tablet (5 mg total) by mouth daily. Take an extra 5 mg if BP is > 145/90 03/03/19   Burns, Claudina Lick, MD  benazepril (LOTENSIN) 40 MG tablet TAKE 1 TABLET BY MOUTH DAILY. 12/30/18   [name], MD  meclizine (ANTIVERT) 25 MG tablet Take 1 tablet (25 mg total) by mouth 3 (three) times daily as needed for dizziness. 10/05/18   [name], MD  metoprolol succinate (TOPROL-XL) 50 MG 24 hr tablet Take 1 tablet (50 mg total) by mouth daily. Take with or immediately following a meal. 06/26/18   Burns, Claudina Lick, MD  pantoprazole (PROTONIX) 40 MG tablet TAKE 1 TABLET BY MOUTH ONCE DAILY AS NEEDED FOR  ACID  REFLUX 10/25/18   [name], MD  simvastatin (ZOCOR) 10 MG tablet Take 1 tablet (10 mg total) by mouth at bedtime. 03/04/19   [name], MD  spironolactone (ALDACTONE) 25 MG tablet Take 0.5 tablets (12.5 mg  total) by mouth once for 1 dose. 03/04/19 03/04/19  [name], MD  warfarin (COUMADIN) 5 MG tablet Take 1 tablet daily or take as directed by anticoagulation clinic 09/29/18   [name], MD    Allergies    Vicodin [hydrocodone-acetaminophen], Aspirin, Atorvastatin, and Hctz [hydrochlorothiazide]  Review of Systems   Review of Systems  Neurological: Positive for headaches.  All other systems reviewed and are negative.   Physical Exam Updated Vital  Signs BP (!) 167/70    Pulse 74    Temp (!) 97.5 F (36.4 C)    Resp 16    Ht 5\' 1"  (1.549 m)    Wt 81 kg    SpO2 100%    BMI 33.74 kg/m   Physical Exam Vitals and nursing note reviewed.  Constitutional:      Appearance: Normal appearance.  HENT:     Head: Normocephalic and atraumatic.     Right Ear: External ear normal.     Left Ear: External ear normal.     Nose: Nose normal.     Mouth/Throat:     Mouth: Mucous membranes are moist.     Pharynx: Oropharynx is clear.  Eyes:     Extraocular Movements: Extraocular movements intact.     Conjunctiva/sclera: Conjunctivae normal.     Pupils: Pupils are equal, round, and reactive to light.  Cardiovascular:     Rate and Rhythm: Normal rate and regular rhythm.     Pulses: Normal pulses.     Heart sounds: Normal heart sounds.  Pulmonary:     Effort: Pulmonary effort is normal.     Breath sounds: Normal breath sounds.  Abdominal:     General: Abdomen is flat. Bowel sounds are normal.     Palpations: Abdomen is soft.  Musculoskeletal:        General: Normal range of motion.     Cervical back: Normal range of motion and neck supple.  Skin:    General: Skin is warm.     Capillary Refill: Capillary refill takes less than 2 seconds.  Neurological:     General: No focal deficit present.     Mental Status: She is alert and oriented to person, place, and time.  Psychiatric:        Mood and Affect: Mood normal.        Behavior: Behavior normal.        Thought Content: Thought  content normal.        Judgment: Judgment normal.     ED Results / Procedures / Treatments   Labs (all labs ordered are listed, but only abnormal results are displayed) Labs Reviewed  BASIC METABOLIC PANEL - Abnormal; Notable for the following components:      Result Value   Glucose, Bld 108 (*)    GFR calc non Af Amer 59 (*)    All other components within normal limits  PROTIME-INR - Abnormal; Notable for the following components:   Prothrombin Time 25.8 (*)    INR 2.4 (*)    All other components within normal limits  CBC WITH DIFFERENTIAL/PLATELET    EKG None  Radiology CT Head Wo Contrast  Result Date: 03/18/2019 CLINICAL DATA:  Headache, acute, normal neuro exam. Additional history provided: High blood pressure, some dizziness. EXAM: CT HEAD WITHOUT CONTRAST TECHNIQUE: Contiguous axial images were obtained from the base of the skull through the vertex without intravenous contrast. COMPARISON:  Brain MRI 01/23/2019, head CT 05/14/2018 FINDINGS: Brain: No evidence of acute intracranial hemorrhage. No demarcated cortical infarction. No midline shift or extra-axial fluid collection. 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 Mild generalized parenchymal atrophy. Vascular: No hyperdense vessel.  Atherosclerotic calcifications. Skull: Normal. Negative for fracture or focal lesion. Sinuses/Orbits: Visualized orbits demonstrate no  acute abnormality. No significant paranasal sinus disease or mastoid effusion at the imaged levels. 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 Please refer to prior brain MRI for further description. Mild generalized parenchymal atrophy. Electronically Signed   By: Kellie Simmering DO   On: 03/18/2019 18:59    Procedures Procedures (including critical care time)  Medications Ordered in ED Medications - No data to display  ED Course  I have reviewed the triage vital signs and the nursing notes.  Pertinent labs & imaging results that were available during my care of the patient were reviewed by me and considered in my medical decision making (see chart for details).    MDM Rules/Calculators/A&P                      Pt told of the infarct noted.  SBP down to 130 without any additional treatment.  Pt instructed to take 10 mg amlodipine daily for the next few days to see if that helps with her bp.  Return if worse.  F/u with pcp.  Final Clinical Impression(s) / ED Diagnoses Final diagnoses:  Essential hypertension    Rx / DC Orders ED Discharge Orders    None       Isla Pence, MD 03/18/19 [phone]

## 2019-03-18 NOTE — ED Triage Notes (Signed)
Pt c/o increased BP and h/a x 1 day

## 2019-03-18 NOTE — Telephone Encounter (Signed)
Patient called with C/O symptomatic hypertension.  She states that she has spoken with Dr burns about this issue.  She states that he BP goes up 160/74 and 161/80.now and she becomes flushed and keeps a nagging HA.  She states her BP was up this am and she took an extra amlodipine as instructed by Dr Quay Burow. She states at 3 her BP had gone down to 123456 systolic but then went back up.  She is not weak or lightheaded had has no chest pain. Per protocol patient will go to ER for evaluation  Reason for Disposition . [id number] Systolic BP  >= 0000000 OR Diastolic >= [id number] AND [id number] cardiac or neurologic symptoms (e.g., chest pain, difficulty breathing, unsteady gait, blurred vision)  Answer Assessment - Initial Assessment Questions 1. BLOOD PRESSURE: "What is the blood pressure?" "Did you take at least two measurements 5 minutes apart?"    160/74,   2. ONSET: "When did you take your blood pressure?"   today 3. HOW: "How did you obtain the blood pressure?" (e.g., visiting nurse, automatic home BP monitor)     Automatic  4. HISTORY: "Do you have a history of high blood pressure?"     yes 5. MEDICATIONS: "Are you taking any medications for blood pressure?" "Have you missed any doses recently?"     Took all medication and doubled  amlodipine 6. OTHER SYMPTOMS: "Do you have any symptoms?" (e.g., headache, chest pain, blurred vision, difficulty breathing, weakness)    Headache, flushing,  7. PREGNANCY: "Is there any chance you are pregnant?" "When was your last menstrual period?"     N/A  Protocols used: HIGH BLOOD PRESSURE-A-AH

## 2019-03-21 NOTE — Telephone Encounter (Signed)
FYI

## 2019-03-21 NOTE — Telephone Encounter (Signed)
noted 

## 2019-03-29 ENCOUNTER — Ambulatory Visit: Payer: Medicare Other

## 2019-04-05 ENCOUNTER — Ambulatory Visit (INDEPENDENT_AMBULATORY_CARE_PROVIDER_SITE_OTHER): Payer: Medicare Other | Admitting: General Practice

## 2019-04-05 ENCOUNTER — Other Ambulatory Visit: Payer: Self-pay

## 2019-04-05 DIAGNOSIS — Z86718 Personal history of other venous thrombosis and embolism: Secondary | ICD-10-CM

## 2019-04-05 DIAGNOSIS — Z7901 Long term (current) use of anticoagulants: Secondary | ICD-10-CM

## 2019-04-05 LAB — POCT INR: INR: 2.4 (ref 2.0–3.0)

## 2019-04-05 NOTE — Patient Instructions (Signed)
.  lbpcmh  Continue to take 1/2 tablet daily except 1 tablet on Mondays only.  Re-check in 6 weeks.

## 2019-04-07 NOTE — Progress Notes (Signed)
Agree with management.  Naje Rice J Gustavo Dispenza, MD  

## 2019-05-17 ENCOUNTER — Other Ambulatory Visit: Payer: Self-pay

## 2019-05-17 ENCOUNTER — Ambulatory Visit (INDEPENDENT_AMBULATORY_CARE_PROVIDER_SITE_OTHER): Payer: Medicare Other | Admitting: General Practice

## 2019-05-17 DIAGNOSIS — Z7901 Long term (current) use of anticoagulants: Secondary | ICD-10-CM | POA: Diagnosis not present

## 2019-05-17 DIAGNOSIS — Z86718 Personal history of other venous thrombosis and embolism: Secondary | ICD-10-CM

## 2019-05-17 LAB — POCT INR: INR: 1.7 — AB (ref 2.0–3.0)

## 2019-05-17 NOTE — Progress Notes (Signed)
Agree with management.  Shaunae Sieloff J Andelyn Spade, MD  

## 2019-05-17 NOTE — Patient Instructions (Addendum)
Pre visit review using our clinic review tool, if applicable. No additional management support is needed unless otherwise documented below in the visit note.  Take 1 tablet today and then continue to take 1/2 tablet daily except 1 tablet on Mondays only.  Re-check in 3 to 4 weeks.

## 2019-06-01 NOTE — Progress Notes (Signed)
Telephone visit not completed.  Tried calling patient twice with no answer.  She later called and said that she didn't hear the phone.

## 2019-06-02 ENCOUNTER — Telehealth (INDEPENDENT_AMBULATORY_CARE_PROVIDER_SITE_OTHER): Payer: Medicare Other | Admitting: Neurology

## 2019-06-02 ENCOUNTER — Encounter: Payer: Self-pay | Admitting: Neurology

## 2019-06-02 ENCOUNTER — Other Ambulatory Visit: Payer: Self-pay

## 2019-06-02 DIAGNOSIS — G459 Transient cerebral ischemic attack, unspecified: Secondary | ICD-10-CM

## 2019-06-06 ENCOUNTER — Other Ambulatory Visit: Payer: Self-pay | Admitting: Internal Medicine

## 2019-06-07 ENCOUNTER — Other Ambulatory Visit: Payer: Self-pay | Admitting: Internal Medicine

## 2019-06-14 ENCOUNTER — Other Ambulatory Visit: Payer: Self-pay

## 2019-06-14 ENCOUNTER — Ambulatory Visit (INDEPENDENT_AMBULATORY_CARE_PROVIDER_SITE_OTHER): Payer: Medicare Other | Admitting: General Practice

## 2019-06-14 DIAGNOSIS — Z7901 Long term (current) use of anticoagulants: Secondary | ICD-10-CM | POA: Diagnosis not present

## 2019-06-14 DIAGNOSIS — Z86718 Personal history of other venous thrombosis and embolism: Secondary | ICD-10-CM

## 2019-06-14 LAB — POCT INR: INR: 2 (ref 2.0–3.0)

## 2019-06-14 NOTE — Progress Notes (Signed)
Agree with management.  Bonnie Zhang Bonnie Chevella Pearce, MD  

## 2019-06-14 NOTE — Patient Instructions (Addendum)
Pre visit review using our clinic review tool, if applicable. No additional management support is needed unless otherwise documented below in the visit note.  Change dosage and take 1/2 tablet daily except 1 tablet on Monday and Thursdays.  Re-check in weeks.

## 2019-06-23 ENCOUNTER — Other Ambulatory Visit: Payer: Self-pay | Admitting: Internal Medicine

## 2019-07-04 ENCOUNTER — Telehealth: Payer: Self-pay

## 2019-07-04 ENCOUNTER — Other Ambulatory Visit: Payer: Self-pay | Admitting: Internal Medicine

## 2019-07-04 NOTE — Telephone Encounter (Signed)
INR at good level.  Ok to take vaccine

## 2019-07-04 NOTE — Telephone Encounter (Signed)
New message    The patient has a question regarding the COVID vaccine - h/o blood thinner.

## 2019-07-05 NOTE — Telephone Encounter (Signed)
Pt aware of response below.  

## 2019-07-07 ENCOUNTER — Other Ambulatory Visit: Payer: Self-pay | Admitting: Neurological Surgery

## 2019-07-07 DIAGNOSIS — H4940 Progressive external ophthalmoplegia, unspecified eye: Secondary | ICD-10-CM

## 2019-07-07 DIAGNOSIS — H499 Unspecified paralytic strabismus: Secondary | ICD-10-CM

## 2019-07-08 ENCOUNTER — Telehealth: Payer: Self-pay | Admitting: Internal Medicine

## 2019-07-08 DIAGNOSIS — I779 Disorder of arteries and arterioles, unspecified: Secondary | ICD-10-CM

## 2019-07-08 NOTE — Chronic Care Management (AMB) (Signed)
  Chronic Care Management   Note  07/08/2019 Name: Bonnie Zhang MRN: [id number] DOB: 07-13-1940  Bonnie Zhang is a 79 y.o. year old female who is a primary care patient of Burns, Claudina Lick, MD. I reached out to Bonnie Zhang by phone today in response to a referral sent by Ms. Doran Stabler Roulston's PCP, [name], MD.   Bonnie Zhang was given information about Chronic Care Management services today including:  1. CCM service includes personalized support from designated clinical staff supervised by her physician, including individualized plan of care and coordination with other care providers 2. 24/7 contact phone numbers for assistance for urgent and routine care needs. 3. Service will only be billed when office clinical staff spend 20 minutes or more in a month to coordinate care. 4. Only one practitioner may furnish and bill the service in a calendar month. 5. The patient may stop CCM services at any time (effective at the end of the month) by phone call to the office staff.   Patient agreed to services and verbal consent obtained.   Follow up plan:   Raynicia Dukes [facility] Scheduler

## 2019-07-19 ENCOUNTER — Ambulatory Visit (INDEPENDENT_AMBULATORY_CARE_PROVIDER_SITE_OTHER): Payer: Medicare Other | Admitting: General Practice

## 2019-07-19 ENCOUNTER — Other Ambulatory Visit: Payer: Self-pay

## 2019-07-19 DIAGNOSIS — Z7901 Long term (current) use of anticoagulants: Secondary | ICD-10-CM

## 2019-07-19 DIAGNOSIS — Z86718 Personal history of other venous thrombosis and embolism: Secondary | ICD-10-CM

## 2019-07-19 LAB — POCT INR: INR: 2 (ref 2.0–3.0)

## 2019-07-19 NOTE — Patient Instructions (Signed)
Pre visit review using our clinic review tool, if applicable. No additional management support is needed unless otherwise documented below in the visit note.  Continue to take 1/2 tablet daily except 1 tablet on Monday and Thursdays.  Re-check in 6 weeks.

## 2019-07-19 NOTE — Progress Notes (Signed)
Agree with management.  Itsel Opfer J Deyton Ellenbecker, MD  

## 2019-08-01 NOTE — Addendum Note (Signed)
Addended by: Aviva Signs M on: 08/01/2019 01:10 PM   Modules accepted: Orders

## 2019-08-02 ENCOUNTER — Other Ambulatory Visit: Payer: Self-pay

## 2019-08-02 ENCOUNTER — Ambulatory Visit: Payer: Medicare Other | Admitting: Pharmacist

## 2019-08-02 DIAGNOSIS — Z86718 Personal history of other venous thrombosis and embolism: Secondary | ICD-10-CM

## 2019-08-02 DIAGNOSIS — I10 Essential (primary) hypertension: Secondary | ICD-10-CM

## 2019-08-02 DIAGNOSIS — E782 Mixed hyperlipidemia: Secondary | ICD-10-CM

## 2019-08-02 NOTE — Chronic Care Management (AMB) (Signed)
Chronic Care Management Pharmacy  Name: Bonnie Zhang  MRN: [id number] DOB: [birth year]   Chief Complaint/ HPI  Bonnie Zhang,  79 y.o. , female presents for their Initial CCM visit with the clinical pharmacist via telephone due to COVID-19 Pandemic.  PCP : [name], MD  Their chronic conditions include: HTN, TIA, PVD, HLD, GERD, osteoporosis, hx DVT w/ heterozygous Factor V Leiden  Has problems with flushing/hypertension  Office Visits: 03/03/19 Dr Quay Burow OV: conditions stable, no med changes  01/03/19 Dr Quay Burow OV: concern for TIA, increased simvastatin to 20 mg and referred to neuro. Ordered carotid US and MRI brain.  Consult Visit: 03/18/19 ED visit: elevated BP (167/70) and headache. Resolved on its own. CT Head revealed new lacunar infarct. Increased amlodipine to 10 mg for a few days.  01/26/19 Dr Tomi Likens (neurology): referred for suspected TIA. No change in therapy due to already on warfarin, statin with LDL < 70  Medications: Outpatient Encounter Medications as of 08/02/2019  Medication Sig  . acetaminophen (TYLENOL) 500 MG tablet Take 500 mg by mouth every 6 (six) hours as needed.  Marland Kitchen amLODipine (NORVASC) 5 MG tablet Take 1 tablet (5 mg total) by mouth daily. Take an extra 5 mg if BP is > 145/90  . benazepril (LOTENSIN) 40 MG tablet TAKE 1 TABLET BY MOUTH DAILY.  . meclizine (ANTIVERT) 25 MG tablet Take 1 tablet (25 mg total) by mouth 3 (three) times daily as needed for dizziness.  . metoprolol succinate (TOPROL-XL) 50 MG 24 hr tablet TAKE 1 TABLET (50 MG TOTAL) BY MOUTH DAILY. TAKE WITH OR IMMEDIATELY FOLLOWING A MEAL.  . pantoprazole (PROTONIX) 40 MG tablet TAKE 1 TABLET BY MOUTH ONCE DAILY AS NEEDED FOR  ACID  REFLUX  . simvastatin (ZOCOR) 10 MG tablet Take 1 tablet (10 mg total) by mouth at bedtime.  . vitamin B-12 (CYANOCOBALAMIN) 1000 MCG tablet Take 1,000 mcg by mouth daily.  Marland Kitchen warfarin (COUMADIN) 5 MG tablet Take 1 tablet daily or take as directed by  anticoagulation clinic  . spironolactone (ALDACTONE) 25 MG tablet Take 0.5 tablets (12.5 mg total) by mouth once for 1 dose.   No facility-administered encounter medications on file as of 08/02/2019.     Current Diagnosis/Assessment:  SDOH Interventions     Most Recent Value  SDOH Interventions  SDOH Interventions for the Following Domains  Transportation  Transportation Interventions  Other (Comment) [no transportation needs identified]     Goals Addressed            This Visit's Progress   . Pharmacy Care Plan       CARE PLAN ENTRY  Current Barriers:  . Chronic Disease Management support, education, and care coordination needs related to Hypertension, Hyperlipidemia, and Recurrent DVT   Hypertension . Pharmacist Clinical Goal(s): o Over the next 90 days, patient will work with PharmD and providers to maintain BP goal <140/90 . Current regimen:  o Amlodipine 5 mg daily, extra 5 mg if BP > 145/90 o Benazepril 40 mg daily o Metoprolol succinate 50 mg daily o Spironolactone 12.5 mg (1/2 of 25 mg) daily . Interventions: o Discussed BP goal and benefits of medications o Discussed flushing is not a common side effect of her current medications; however amlodipine can contribute . Patient self care activities - Over the next 90 days, patient will: o Check BP 1-2x weekly, document, and provide at future appointments o Ensure daily salt intake < 2300 mg/day  Hyperlipidemia/history of TIA Lipid  Panel     Component Value Date/Time   CHOL 153 03/03/2019 1025   TRIG 135.0 03/03/2019 1025   HDL 59.90 03/03/2019 1025   LDLCALC 67 03/03/2019 1025   . Pharmacist Clinical Goal(s): o Over the next 90 days, patient will work with PharmD and providers to maintain LDL goal < 70 . Current regimen:  o Simvastatin 10 mg daily . Interventions: o Discussed cholesterol goals and how simvastatin works best overnight o Discussed importance of avoiding high-cholesterol foods . Patient self  care activities - Over the next 90 days, patient will: o Take simvastatin as directed at bedtime o Limit high-cholesterol foods in diet  Recurrent DVT . Pharmacist Clinical Goal(s) o Over the next 90 days, patient will work with PharmD and providers to optimize anticoagulation therapy . Current regimen:  o Warfarin 5 mg as directed per Anti-Coag clinic . Interventions: o Discussed bleeding risk with warfarin - patient has not had issues since stopping aspirin years ago . Patient self care activities - Over the next 90 days, patient will: o Continue to follow up with Jenny Reichmann in Anti-Coag clinic o Continue consistent Vitamin-K diet  Medication management . Pharmacist Clinical Goal(s): o Over the next 90 days, patient will work with PharmD and providers to achieve optimal medication adherence . Current pharmacy: [facility] . Interventions o Comprehensive medication review performed. o Continue current medication management strategy . Patient self care activities - Over the next 90 days, patient will: o Focus on medication adherence by pill box o Take medications as prescribed o Report any questions or concerns to PharmD and/or provider(s)  Initial goal documentation        Hypertension   Office blood pressures are  BP Readings from Last 3 Encounters:  03/18/19 (!) 167/70  03/03/19 130/68  01/26/19 128/77   Kidney Function Lab Results  Component Value Date/Time   CREATININE 0.93 03/18/2019 06:58 PM   CREATININE 0.91 03/03/2019 10:25 AM   GFR 59.86 (L) 03/03/2019 10:25 AM   GFRNONAA 59 (L) 03/18/2019 06:58 PM   Patient has failed these meds in the past: HCTZ (hypokalemia, fatigue) Patient is currently controlled on the following medications:   Amlodipine 5 mg daily, extra 5 mg if BP > 145/90  Benazepril 40 mg daily  Metoprolol succinate 50 mg daily   Spironolactone 12.5 mg (1/2 of 25 mg) daily  Patient checks BP at home 1-2x per week  Patient home BP readings  are ranging: 135-140/70s  We discussed diet and exercise extensively; 2 knee replacements hinders walking some. Pt has not taken an extra amlodipine in a few months. Describes flushing "spells" that are independent of elevated BP. Counseled that flushing is not a common side effect, but amlodipine and benazepril can contribute.  Plan  Continue current medications  If flushing persists may consider d/c amlodipine   Hyperlipidemia   Lipid Panel     Component Value Date/Time   CHOL 153 03/03/2019 1025   TRIG 135.0 03/03/2019 1025   HDL 59.90 03/03/2019 1025   CHOLHDL 3 03/03/2019 1025   VLDL 27.0 03/03/2019 1025   LDLCALC 67 03/03/2019 1025    Hx TIA, PVD. LDL goal < 70  Patient has failed these meds in past: atorvastatin Patient is currently controlled on the following medications:   Simvastatin 10 mg daily   We discussed:  diet and exercise extensively; statin mechanism and counseled to take at night.  Plan  Continue current medications and control with diet and exercise   Hx  Recurrent DVT   INR  Date Value Ref Range Status  07/19/2019 2.0 2.0 - 3.0 Final   Patient has failed these meds in past: n/a Patient is currently controlled on the following medications:   Warfarin 5 mg as directed  Managed by [facility] anti-co[facility] - [facility].  We discussed: Denies issues with bleeding, she did have nosebleeds many years ago when she was also on aspirin. She had a friend who was on DOAC and had GI bleed, she is happy to continue warfarin.   Plan  Continue current medications    GERD   Patient has failed these meds in past: n/a Patient is currently controlled on the following medications:   Pantoprazole 40 mg daily prn  We discussed:  Takes very seldom, last dose about a month ago. Pt tries not to eat after 6pm to prevent reflux.  Plan  Continue current medications    Health Maintenance   Patient is currently controlled on the following medications:   Tylenol 500 mg  q6h prn  Meclizine 25 mg TID prn - has not used in months  Vitamin B12 1000 mcg daily  We discussed: Patient is satisfied with current OTC regimen and denies issues  Plan  Continue current medications   Medication Management   Pt uses [facility] for all medications Uses pill box? Yes - takes all meds in AM Pt endorses 100% compliance  We discussed:  Benefits of medication synchronization, packaging and delivery with [facility]. Pt is interested and will think about changing, currently very satisfied with [facility].  Plan  Continue current medication management strategy    Follow up: 3 month phone visit  Charlene Brooke, PharmD Clinical Pharmacist [facility] [phone]

## 2019-08-02 NOTE — Patient Instructions (Signed)
Visit Information  Thank you for meeting with me to discuss your medications! I look forward to working with you to achieve your health care goals. Below is a summary of what we talked about during the visit:  Goals Addressed            This Visit's Progress   . Pharmacy Care Plan       CARE PLAN ENTRY  Current Barriers:  . Chronic Disease Management support, education, and care coordination needs related to Hypertension, Hyperlipidemia, and Recurrent DVT   Hypertension . Pharmacist Clinical Goal(s): o Over the next 90 days, patient will work with PharmD and providers to maintain BP goal <140/90 . Current regimen:  o Amlodipine 5 mg daily, extra 5 mg if BP > 145/90 o Benazepril 40 mg daily o Metoprolol succinate 50 mg daily o Spironolactone 12.5 mg (1/2 of 25 mg) daily . Interventions: o Discussed BP goal and benefits of medications o Discussed flushing is not a common side effect of her current medications; however amlodipine can contribute . Patient self care activities - Over the next 90 days, patient will: o Check BP 1-2x weekly, document, and provide at future appointments o Ensure daily salt intake < 2300 mg/day  Hyperlipidemia/history of TIA Lipid Panel     Component Value Date/Time   CHOL 153 03/03/2019 1025   TRIG 135.0 03/03/2019 1025   HDL 59.90 03/03/2019 1025   LDLCALC 67 03/03/2019 1025   . Pharmacist Clinical Goal(s): o Over the next 90 days, patient will work with PharmD and providers to maintain LDL goal < 70 . Current regimen:  o Simvastatin 10 mg daily . Interventions: o Discussed cholesterol goals and how simvastatin works best overnight o Discussed importance of avoiding high-cholesterol foods . Patient self care activities - Over the next 90 days, patient will: o Take simvastatin as directed at bedtime o Limit high-cholesterol foods in diet  Recurrent DVT . Pharmacist Clinical Goal(s) o Over the next 90 days, patient will work with PharmD and  providers to optimize anticoagulation therapy . Current regimen:  o Warfarin 5 mg as directed per Anti-Coag clinic . Interventions: o Discussed bleeding risk with warfarin - patient has not had issues since stopping aspirin years ago . Patient self care activities - Over the next 90 days, patient will: o Continue to follow up with Jenny Reichmann in Anti-Coag clinic o Continue consistent Vitamin-K diet  Medication management . Pharmacist Clinical Goal(s): o Over the next 90 days, patient will work with PharmD and providers to achieve optimal medication adherence . Current pharmacy: [facility] . Interventions o Comprehensive medication review performed. o Continue current medication management strategy . Patient self care activities - Over the next 90 days, patient will: o Focus on medication adherence by pill box o Take medications as prescribed o Report any questions or concerns to PharmD and/or provider(s)  Initial goal documentation        Ms. Byrnes was given information about Chronic Care Management services today including:  1. CCM service includes personalized support from designated clinical staff supervised by her physician, including individualized plan of care and coordination with other care providers 2. 24/7 contact phone numbers for assistance for urgent and routine care needs. 3. Standard insurance, coinsurance, copays and deductibles apply for chronic care management only during months in which we provide at least 20 minutes of these services. Most insurances cover these services at 100%, however patients may be responsible for any copay, coinsurance and/or deductible if applicable. This  service may help you avoid the need for more expensive face-to-face services. 4. Only one practitioner may furnish and bill the service in a calendar month. 5. The patient may stop CCM services at any time (effective at the end of the month) by phone call to the office staff.  Patient agreed  to services and verbal consent obtained.   The patient verbalized understanding of instructions provided today and agreed to receive a mailed copy of patient instruction and/or educational materials. Telephone follow up appointment with pharmacy team member scheduled for: 3 months  Charlene Brooke, PharmD Clinical Pharmacist [facility]   [employer] Blood Pressure DASH stands for "Dietary Approaches to Stop Hypertension." The DASH eating plan is a healthy eating plan that has been shown to reduce high blood pressure (hypertension). It may also reduce your risk for type 2 diabetes, heart disease, and stroke. The DASH eating plan may also help with weight loss. What are tips for following this plan?  General guidelines  Avoid eating more than 2,300 mg (milligrams) of salt (sodium) a day. If you have hypertension, you may need to reduce your sodium intake to 1,500 mg a day.  Limit alcohol intake to no more than 1 drink a day for nonpregnant women and 2 drinks a day for men. One drink equals 12 oz of beer, 5 oz of wine, or 1 oz of hard liquor.  Work with your health care provider to maintain a healthy body weight or to lose weight. Ask what an ideal weight is for you.  Get at least 30 minutes of exercise that causes your heart to beat faster (aerobic exercise) most days of the week. Activities may include walking, swimming, or biking.  Work with your health care provider or diet and nutrition specialist (dietitian) to adjust your eating plan to your individual calorie needs. Reading food labels   Check food labels for the amount of sodium per serving. Choose foods with less than 5 percent of the Daily Value of sodium. Generally, foods with less than 300 mg of sodium per serving fit into this eating plan.  To find whole grains, look for the word "whole" as the first word in the ingredient list. Shopping  Buy products labeled as  "low-sodium" or "no salt added."  Buy fresh foods. Avoid canned foods and premade or frozen meals. Cooking  Avoid adding salt when cooking. Use salt-free seasonings or herbs instead of table salt or sea salt. Check with your health care provider or pharmacist before using salt substitutes.  Do not fry foods. Cook foods using healthy methods such as baking, boiling, grilling, and broiling instead.  Cook with heart-healthy oils, such as olive, canola, soybean, or sunflower oil. Meal planning  Eat a balanced diet that includes: ? 5 or more servings of fruits and vegetables each day. At each meal, try to fill half of your plate with fruits and vegetables. ? Up to 6-8 servings of whole grains each day. ? Less than 6 oz of lean meat, poultry, or fish each day. A 3-oz serving of meat is about the same size as a deck of cards. One egg equals 1 oz. ? 2 servings of low-fat dairy each day. ? A serving of nuts, seeds, or beans 5 times each week. ? Heart-healthy fats. Healthy fats called Omega-3 fatty acids are found in foods such as flaxseeds and coldwater fish, like sardines, salmon, and mackerel.  Limit how much you eat  of the following: ? Canned or prepackaged foods. ? Food that is high in trans fat, such as fried foods. ? Food that is high in saturated fat, such as fatty meat. ? Sweets, desserts, sugary drinks, and other foods with added sugar. ? Full-fat dairy products.  Do not salt foods before eating.  Try to eat at least 2 vegetarian meals each week.  Eat more home-cooked food and less restaurant, buffet, and fast food.  When eating at a restaurant, ask that your food be prepared with less salt or no salt, if possible. What foods are recommended? The items listed may not be a complete list. Talk with your dietitian about what dietary choices are best for you. Grains Whole-grain or whole-wheat bread. Whole-grain or whole-wheat pasta. Brown rice. Modena Morrow. Bulgur. Whole-grain  and low-sodium cereals. Pita bread. Low-fat, low-sodium crackers. Whole-wheat flour tortillas. Vegetables Fresh or frozen vegetables (raw, steamed, roasted, or grilled). Low-sodium or reduced-sodium tomato and vegetable juice. Low-sodium or reduced-sodium tomato sauce and tomato paste. Low-sodium or reduced-sodium canned vegetables. Fruits All fresh, dried, or frozen fruit. Canned fruit in natural juice (without added sugar). Meat and other protein foods Skinless chicken or [place]. Ground chicken or [place]. Pork with fat trimmed off. Fish and seafood. Egg whites. Dried beans, peas, or lentils. Unsalted nuts, nut butters, and seeds. Unsalted canned beans. Lean cuts of beef with fat trimmed off. Low-sodium, lean deli meat. Dairy Low-fat (1%) or fat-free (skim) milk. Fat-free, low-fat, or reduced-fat cheeses. Nonfat, low-sodium ricotta or cottage cheese. Low-fat or nonfat yogurt. Low-fat, low-sodium cheese. Fats and oils Soft margarine without trans fats. Vegetable oil. Low-fat, reduced-fat, or light mayonnaise and salad dressings (reduced-sodium). Canola, safflower, olive, soybean, and sunflower oils. Avocado. Seasoning and other foods Herbs. Spices. Seasoning mixes without salt. Unsalted popcorn and pretzels. Fat-free sweets. What foods are not recommended? The items listed may not be a complete list. Talk with your dietitian about what dietary choices are best for you. Grains Baked goods made with fat, such as croissants, muffins, or some breads. Dry pasta or rice meal packs. Vegetables Creamed or fried vegetables. Vegetables in a cheese sauce. Regular canned vegetables (not low-sodium or reduced-sodium). Regular canned tomato sauce and paste (not low-sodium or reduced-sodium). Regular tomato and vegetable juice (not low-sodium or reduced-sodium). Angie Fava. Olives. Fruits Canned fruit in a light or heavy syrup. Fried fruit. Fruit in cream or butter sauce. Meat and other protein foods Fatty cuts  of meat. Ribs. Fried meat. Berniece Salines. Sausage. Bologna and other processed lunch meats. Salami. Fatback. Hotdogs. Bratwurst. Salted nuts and seeds. Canned beans with added salt. Canned or smoked fish. Whole eggs or egg yolks. Chicken or [place] with skin. Dairy Whole or 2% milk, cream, and half-and-half. Whole or full-fat cream cheese. Whole-fat or sweetened yogurt. Full-fat cheese. Nondairy creamers. Whipped toppings. Processed cheese and cheese spreads. Fats and oils Butter. Stick margarine. Lard. Shortening. Ghee. Bacon fat. Tropical oils, such as coconut, palm kernel, or palm oil. Seasoning and other foods Salted popcorn and pretzels. Onion salt, garlic salt, seasoned salt, table salt, and sea salt. Worcestershire sauce. Tartar sauce. Barbecue sauce. Teriyaki sauce. Soy sauce, including reduced-sodium. Steak sauce. Canned and packaged gravies. Fish sauce. Oyster sauce. Cocktail sauce. Horseradish that you find on the shelf. Ketchup. Mustard. Meat flavorings and tenderizers. Bouillon cubes. Hot sauce and Tabasco sauce. Premade or packaged marinades. Premade or packaged taco seasonings. Relishes. Regular salad dressings. Where to find more information:  National Heart, Lung, and Blood Institute: https://wilson-eaton.com/  American Heart  Association: www.heart.org Summary  The DASH eating plan is a healthy eating plan that has been shown to reduce high blood pressure (hypertension). It may also reduce your risk for type 2 diabetes, heart disease, and stroke.  With the DASH eating plan, you should limit salt (sodium) intake to 2,300 mg a day. If you have hypertension, you may need to reduce your sodium intake to 1,500 mg a day.  When on the DASH eating plan, aim to eat more fresh fruits and vegetables, whole grains, lean proteins, low-fat dairy, and heart-healthy fats.  Work with your health care provider or diet and nutrition specialist (dietitian) to adjust your eating plan to your individual calorie  needs. This information is not intended to replace advice given to you by your health care provider. Make sure you discuss any questions you have with your health care provider. Document Revised: 02/27/2017 Document Reviewed: 03/10/2016 Elsevier Patient Education  2020 [employer].

## 2019-08-04 ENCOUNTER — Other Ambulatory Visit: Payer: Self-pay

## 2019-08-04 ENCOUNTER — Ambulatory Visit
Admission: RE | Admit: 2019-08-04 | Discharge: 2019-08-04 | Disposition: A | Discharge status: Home / Self Care | Payer: Medicare Other | Source: Ambulatory Visit | Attending: Neurological Surgery | Admitting: Neurological Surgery

## 2019-08-04 DIAGNOSIS — H499 Unspecified paralytic strabismus: Secondary | ICD-10-CM

## 2019-08-04 DIAGNOSIS — H4940 Progressive external ophthalmoplegia, unspecified eye: Secondary | ICD-10-CM

## 2019-08-04 MED ORDER — GADOBENATE DIMEGLUMINE 529 MG/ML IV SOLN
15.0000 mL | Freq: Once | INTRAVENOUS | Status: AC | PRN
  Administered 2019-08-04: 15 mL via INTRAVENOUS

## 2019-08-08 ENCOUNTER — Other Ambulatory Visit: Payer: Self-pay | Admitting: General Practice

## 2019-08-08 DIAGNOSIS — Z7901 Long term (current) use of anticoagulants: Secondary | ICD-10-CM

## 2019-08-08 MED ORDER — WARFARIN SODIUM 5 MG PO TABS: ORAL_TABLET | ORAL | 1 refills | Status: DC

## 2019-08-09 ENCOUNTER — Other Ambulatory Visit: Payer: Self-pay | Admitting: Family Medicine

## 2019-08-09 ENCOUNTER — Ambulatory Visit (INDEPENDENT_AMBULATORY_CARE_PROVIDER_SITE_OTHER): Payer: Medicare Other | Admitting: Family Medicine

## 2019-08-09 ENCOUNTER — Encounter: Payer: Self-pay | Admitting: Family Medicine

## 2019-08-09 ENCOUNTER — Other Ambulatory Visit: Payer: Self-pay

## 2019-08-09 VITALS — BP 151/80 | HR 59 | Wt 174.0 lb

## 2019-08-09 DIAGNOSIS — Z7901 Long term (current) use of anticoagulants: Secondary | ICD-10-CM

## 2019-08-09 DIAGNOSIS — Z9079 Acquired absence of other genital organ(s): Secondary | ICD-10-CM | POA: Diagnosis not present

## 2019-08-09 DIAGNOSIS — N8502 Endometrial intraepithelial neoplasia [EIN]: Secondary | ICD-10-CM

## 2019-08-09 DIAGNOSIS — Z90710 Acquired absence of both cervix and uterus: Secondary | ICD-10-CM

## 2019-08-09 DIAGNOSIS — N950 Postmenopausal bleeding: Secondary | ICD-10-CM | POA: Diagnosis not present

## 2019-08-09 DIAGNOSIS — C52 Malignant neoplasm of vagina: Secondary | ICD-10-CM | POA: Diagnosis not present

## 2019-08-09 DIAGNOSIS — Z90721 Acquired absence of ovaries, unilateral: Secondary | ICD-10-CM

## 2019-08-09 NOTE — Assessment & Plan Note (Signed)
Negative final path, will send specimen off with f/u based on findings.

## 2019-08-09 NOTE — Patient Instructions (Signed)
Vaginal Biopsy, Care After This sheet gives you information about how to care for yourself after your procedure. Your health care provider may also give you more specific instructions. If you have problems or questions, contact your health care provider. What can I expect after the procedure? After the procedure, it is common to have:  Cramping or mild pain for a few days.  Slight bleeding from the vagina for a few days.  Dark-colored vaginal discharge for a few days. Follow these instructions at home: Lifestyle  Do not douche until your health care provider approves.  Do not use tampons until your health care provider approves.  Do not have sex until your health care provider approves.  Return to your normal activities as told by your health care provider. Ask your health care provider what activities are safe for you. General instructions   Take over-the-counter and prescription medicines only as told by your health care provider.  Use a sanitary napkin until bleeding and discharge stop.  Keep all follow-up visits as told by your health care provider. This is important. Contact a health care provider if you have:  A fever or chills.  Bad-smelling vaginal discharge.  Itching or irritation around the vagina.  Lower abdominal pain. Get help right away if you:  Develop heavy vaginal bleeding that soaks more than one sanitary napkin an hour.  Faint.  Have severe pain in your lower abdomen. Summary  After the procedure, it is normal to have cramps, slight bleeding, and dark-colored discharge from the vagina.  After you return home, do not douche, have sex, or use a tampon until your health care provider approves.  Take over-the-counter and prescription medicines only as told by your health care provider.  Get help right away if you develop heavy bleeding, you faint, or you have severe pain in your lower abdomen. This information is not intended to replace advice given  to you by your health care provider. Make sure you discuss any questions you have with your health care provider. Document Revised: 08/20/2017 Document Reviewed: 08/20/2017 Elsevier Patient Education  2020 [employer].

## 2019-08-09 NOTE — Progress Notes (Signed)
    Subjective:    Patient ID: Bonnie Zhang is a 79 y.o. female presenting with Vaginal Bleeding  on 08/09/2019  HPI: Patient is long menopausal and had [facility] with BSO and LN sampling for complex hyperplasia with focus of adenocarcinoma. Final path did not reveal cancer diagnosis in 08/2012. Had been in usual state of health until last month. Developed some light pink discharge and then used a douche. This led to frank bleeding. Still having some brown discharge. Had some minor pain during that time. Otherwise feels well. 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  Review of Systems  Constitutional: Negative for chills and fever.  Respiratory: Negative for shortness of breath.   Cardiovascular: Negative for chest pain.  Gastrointestinal: Negative for abdominal pain, nausea and vomiting.  Genitourinary: Negative for dysuria.  Skin: Negative for rash.      Objective:    BP (!) 151/80   Pulse (!) 59   Wt 174 lb (78.9 kg)   BMI 32.88 kg/m  Physical Exam Constitutional:      General: She is not in acute distress.    Appearance: She is well-developed.  HENT:     Head: Normocephalic and atraumatic.  Eyes:     General: No scleral icterus. Cardiovascular:     Rate and Rhythm: Normal rate.  Pulmonary:     Effort: Pulmonary effort is normal.  Abdominal:     Palpations: Abdomen is soft.  Genitourinary:    Comments: Vagina is pink. There is some friable tissue noted. There is no discrete mass. Musculoskeletal:     Cervical back: Neck supple.  Skin:    General: Skin is warm and dry.  Neurological:     Mental Status: She is alert and oriented to person, place, and time.    Procedure: Patient gives verbal consent. Cervical biopsy forcep used to biopsy wall of vagina. More tissue just came off and in the speculum and all sent with specimen. Moderate bleeding is noted.  Patient tolerated procedure well.       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 - Primary    Negative final path, will send specimen off with f/u based on findings.      Relevant Orders   Surgical pathology( [place]/ [facility])   Long term (current) use of anticoagulants    Other Visit Diagnoses    Postmenopausal bleeding       ? reason--await biopsy results.   Relevant Orders   Surgical pathology( [facility]/ [facility])      Total time: 30 minutes.  Return in about 4 weeks (around 09/06/2019), or if symptoms worsen or fail to improve, for a follow-up.  Bonnie Zhang 08/09/2019 8:49 AM

## 2019-08-09 NOTE — Progress Notes (Signed)
Had an episode of pink tinged discharge 3 weeks ago, pt douched and then had bleeding after that. Now just has a brown discharge.

## 2019-08-10 DIAGNOSIS — D496 Neoplasm of unspecified behavior of brain: Secondary | ICD-10-CM | POA: Diagnosis not present

## 2019-08-10 DIAGNOSIS — I10 Essential (primary) hypertension: Secondary | ICD-10-CM | POA: Diagnosis not present

## 2019-08-11 ENCOUNTER — Telehealth: Payer: Self-pay | Admitting: Internal Medicine

## 2019-08-11 NOTE — Telephone Encounter (Signed)
Received a new pt referral from Dr. Zada Finders from [facility]. Bonnie Zhang has been cld and scheduled to see Dr. Mickeal Skinner on 5/14 at 1030am. Ms. Sokolski is aware to arrive 15 minutes early.

## 2019-08-12 ENCOUNTER — Inpatient Hospital Stay: Payer: Medicare Other | Attending: Internal Medicine | Admitting: Internal Medicine

## 2019-08-12 ENCOUNTER — Other Ambulatory Visit: Payer: Self-pay

## 2019-08-12 DIAGNOSIS — Z7901 Long term (current) use of anticoagulants: Secondary | ICD-10-CM | POA: Insufficient documentation

## 2019-08-12 DIAGNOSIS — D385 Neoplasm of uncertain behavior of other respiratory organs: Secondary | ICD-10-CM | POA: Diagnosis not present

## 2019-08-12 DIAGNOSIS — Z79899 Other long term (current) drug therapy: Secondary | ICD-10-CM | POA: Insufficient documentation

## 2019-08-12 DIAGNOSIS — I10 Essential (primary) hypertension: Secondary | ICD-10-CM | POA: Diagnosis not present

## 2019-08-12 DIAGNOSIS — Z86718 Personal history of other venous thrombosis and embolism: Secondary | ICD-10-CM | POA: Insufficient documentation

## 2019-08-12 DIAGNOSIS — E559 Vitamin D deficiency, unspecified: Secondary | ICD-10-CM | POA: Insufficient documentation

## 2019-08-12 DIAGNOSIS — E538 Deficiency of other specified B group vitamins: Secondary | ICD-10-CM | POA: Diagnosis not present

## 2019-08-12 DIAGNOSIS — M810 Age-related osteoporosis without current pathological fracture: Secondary | ICD-10-CM | POA: Insufficient documentation

## 2019-08-12 DIAGNOSIS — G9389 Other specified disorders of brain: Secondary | ICD-10-CM | POA: Insufficient documentation

## 2019-08-12 DIAGNOSIS — Z8673 Personal history of transient ischemic attack (TIA), and cerebral infarction without residual deficits: Secondary | ICD-10-CM | POA: Diagnosis not present

## 2019-08-12 DIAGNOSIS — E7800 Pure hypercholesterolemia, unspecified: Secondary | ICD-10-CM | POA: Diagnosis not present

## 2019-08-12 DIAGNOSIS — K219 Gastro-esophageal reflux disease without esophagitis: Secondary | ICD-10-CM | POA: Insufficient documentation

## 2019-08-12 NOTE — Progress Notes (Signed)
[facility] at [address], [place] [address] [phone]   New Patient Evaluation  Date of Service: 08/12/19 Patient Name: MAKIRAH CORAL Patient MRN: [id number] Patient DOB: [birth year] Provider: Ventura Sellers, MD  Identifying Statement:  KIMMIE FOUNDS is a 79 y.o. female with left cavernous mass who presents for initial consultation and evaluation.    Referring Provider: Judith Part, [place],  [place] [address]  History of Present Illness: The patient's records from the referring physician were obtained and reviewed and the patient interviewed to confirm this HPI.  Doran Stabler Urbas presents for evaluation and review of abnormal brain MRI findings.  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  Stroke symptoms were sudden onset of speech impairment which resolved on its own.  Through Dr. Zada Finders, repeat MRI was performed this month which showed no growth or change.  He did not recommend surgery or biopsy.  Medications: Current Outpatient Medications on File Prior to Visit  Medication Sig Dispense Refill  . acetaminophen (TYLENOL) 500 MG tablet Take 500 mg by mouth every 6 (six) hours as needed.    Marland Kitchen amLODipine (NORVASC) 5 MG tablet Take 1 tablet (5 mg total) by mouth daily. Take an extra 5 mg if BP is > 145/90 100 tablet 1  . benazepril (LOTENSIN) 40 MG tablet TAKE 1 TABLET BY MOUTH DAILY. 90 tablet 0  . meclizine (ANTIVERT) 25 MG tablet Take 1 tablet (25 mg total) by mouth 3 (three) times daily as needed for dizziness. (Patient not taking: Reported on 08/09/2019) 30 tablet 0  . metoprolol succinate (TOPROL-XL) 50 MG 24 hr tablet TAKE 1 TABLET (50 MG TOTAL) BY MOUTH DAILY. TAKE WITH OR IMMEDIATELY FOLLOWING A MEAL. 90 tablet 0  . pantoprazole (PROTONIX) 40 MG tablet TAKE 1 TABLET BY MOUTH ONCE DAILY AS NEEDED FOR  ACID  REFLUX  90 tablet 0  . simvastatin (ZOCOR) 10 MG tablet Take 1 tablet (10 mg total) by mouth at bedtime. 90 tablet 1  . spironolactone (ALDACTONE) 25 MG tablet Take 0.5 tablets (12.5 mg total) by mouth once for 1 dose. 45 tablet 1  . vitamin B-12 (CYANOCOBALAMIN) 1000 MCG tablet Take 1,000 mcg by mouth daily.    Marland Kitchen warfarin (COUMADIN) 5 MG tablet Take 1/2 tablet daily except take 1 tablet on Mon and Thurs or take as directed by anticoagulation clinic 90 tablet 1   No current facility-administered medications on file prior to visit.    Allergies:  Allergies  Allergen Reactions  . Vicodin [Hydrocodone-Acetaminophen]     Nightmares  . Aspirin Other (See Comments)    upset stomach  . Atorvastatin Other (See Comments)    leg pain  . Hctz [Hydrochlorothiazide] Other (See Comments)    FATIGUE AND EXTREMELY LOW POTASSIUM   Past Medical History:  Past Medical History:  Diagnosis Date  . Anticoagulated on Coumadin 2001   2 blood clots  . Bruises easily    pt is on Coumadin;last dose of Coumadin 08/05/11 and then Lovenox started 08/07/11  . DVT (deep vein thrombosis) in pregnancy     1965 post C section;2001 with prolonged driving while on  HRT  . DVT (deep venous thrombosis) ([facility]) 2001   Was on Prempro.    Marland Kitchen GERD (gastroesophageal reflux disease)    Protonix prn  . High cholesterol    takes Simvastating daily  .  History of colon polyps 2007   Dr Sharlett Iles  . Homocysteinemia ([facility]) 01/23/2015  . Hx of colonic polyps    Dr Sharlett Iles  . Hx of migraines    yrs ago   . Hypertension    takes Benazepril,Amlodipine,and Metoprolol daily  . Osteoporosis   . PONV (postoperative nausea and vomiting)   . Primary localized osteoarthritis of left knee   . PVD (peripheral vascular disease) ([facility]) 1965, 2001   abnormal venous doppler findings due to recurrent DVT'S left leg  . Right knee DJD    knees  . Shortness of breath dyspnea   . Vitamin B12 deficiency (non anemic) 01/24/2015  . Vitamin D deficiency     takes VIt D daily   Past Surgical History:  Past Surgical History:  Procedure Laterality Date  . ABDOMINAL HYSTERECTOMY    . APPENDECTOMY  1965  . BREAST LUMPECTOMY  1987  . CESAREAN SECTION  1961/1965/1966   X 3  . CHOLECYSTECTOMY  1997  . COLONOSCOPY W/ POLYPECTOMY  2007   Dr  Sharlett Iles; due? 2012  . DILATION AND CURETTAGE OF UTERUS  2012   uterine polyp, Dr Kennon Rounds  . ESOPHAGOGASTRODUODENOSCOPY  1997  . HYSTEROSCOPY WITH D & C N/A 08/05/2012   Procedure: DILATATION AND CURETTAGE /HYSTEROSCOPY;  Surgeon: Donnamae Jude, MD;  Location: [facility];  Service: Gynecology;  Laterality: N/A;  . ROBOTIC ASSISTED TOTAL HYSTERECTOMY WITH BILATERAL SALPINGO OOPHERECTOMY Right 09/14/2012   Procedure: ROBOTIC ASSISTED TOTAL HYSTERECTOMY WITH BILATERAL SALPINGO OOPHORECTOMY ,right pelvic LYMPHNODE dissection;  Surgeon: Imagene Gurney A. Alycia Rossetti, MD;  Location: [facility];  Service: Gynecology;  Laterality: Right;  . TOTAL KNEE ARTHROPLASTY  08/11/2011   Procedure: TOTAL KNEE ARTHROPLASTY;  Surgeon: Lorn Junes, MD;  Location: [facility];  Service: Orthopedics;  Laterality: Right;  DR [place] THIS CASE  . TOTAL KNEE ARTHROPLASTY Left 09/11/2014  . TOTAL KNEE ARTHROPLASTY Left 09/11/2014   Procedure: TOTAL KNEE ARTHROPLASTY;  Surgeon: Elsie Saas, MD;  Location: [facility];  Service: Orthopedics;  Laterality: Left;  . WISDOM TOOTH EXTRACTION     Social History:  Social History   Socioeconomic History  . Marital status: Divorced    Spouse name: Not on file  . Number of children: 2  . Years of education: Not on file  . Highest education level: High school graduate  Occupational History  . Occupation: Retired  Tobacco Use  . Smoking status: Never Smoker  . Smokeless tobacco: Never Used  Substance and Sexual Activity  . Alcohol use: No  . Drug use: No  . Sexual activity: Never    Birth control/protection: Post-menopausal    Comment: retired. has 2 daughters  Other Topics Concern  . Not on  file  Social History Narrative   Pt lives alone she has 2 daughter - right handed- drinks tea, soda sometimes - No regular exercise   Social Determinants of Health   Financial Resource Strain:   . Difficulty of Paying Living Expenses:   Food Insecurity:   . Worried About Charity fundraiser in the Last Year:   . Arboriculturist in the Last Year:   Transportation Needs: No Transportation Needs  . Lack of Transportation (Medical): No  . Lack of Transportation (Non-Medical): No  Physical Activity:   . Days of Exercise per Week:   . Minutes of Exercise per Session:   Stress:   . Feeling of Stress :   Social Connections: Unknown  . Frequency  of Communication with Friends and Family: Not on file  . Frequency of Social Gatherings with Friends and Family: Not on file  . Attends Religious Services: More than 4 times per year  . Active Member of Clubs or Organizations: Not on file  . Attends Archivist Meetings: Not on file  . Marital Status: Not on file  Intimate Partner Violence:   . Fear of Current or Ex-Partner:   . Emotionally Abused:   Marland Kitchen Physically Abused:   . Sexually Abused:    Family History:  Family History  Problem Relation Age of Onset  . Kidney disease Mother        ? hypertensive  . Hypertension Mother   . Deep vein thrombosis Mother        post ankle fracture  . Leukemia Father   . Colon cancer Maternal Grandmother   . Diabetes Other        cousins  . Hypertension Sister   . Anesthesia problems Neg Hx   . Hypotension Neg Hx   . Malignant hyperthermia Neg Hx   . Pseudochol deficiency Neg Hx   . Stroke Neg Hx   . Heart disease Neg Hx     Review of Systems: Constitutional: Doesn't report fevers, chills or abnormal weight loss Eyes: Doesn't report blurriness of vision Ears, nose, mouth, throat, and face: Doesn't report sore throat Respiratory: Doesn't report cough, dyspnea or wheezes Cardiovascular: Doesn't report palpitation, chest discomfort   Gastrointestinal:  Doesn't report nausea, constipation, diarrhea GU: Doesn't report incontinence Skin: Doesn't report skin rashes Neurological: Per HPI Musculoskeletal: Doesn't report joint pain Behavioral/Psych: Doesn't report anxiety  Physical Exam: Vitals:   08/12/19 1045  BP: (!) 147/78  Pulse: 64  Resp: 18  Temp: 98 F (36.7 C)  SpO2: 99%   KPS: 80. General: Alert, cooperative, pleasant, in no acute distress Head: Normal EENT: No conjunctival injection or scleral icterus.  Lungs: Resp effort normal Cardiac: Regular rate Abdomen: Non-distended abdomen Skin: No rashes cyanosis or petechiae. Extremities: No clubbing or edema  Neurologic Exam: Mental Status: Awake, alert, attentive to examiner. Oriented to self and environment. Language is fluent with intact comprehension.  Cranial Nerves: Visual acuity is grossly normal. Visual fields are full. Extra-ocular movements intact. No ptosis. Face is symmetric Motor: Tone and bulk are normal. Power is full in both arms and legs. Reflexes are symmetric, no pathologic reflexes present.  Sensory: Intact to light touch Gait: Normal.   Labs: I have reviewed the data as listed    Component Value Date/Time   NA 140 03/18/2019 1858   K 3.9 03/18/2019 1858   CL 103 03/18/2019 1858   CO2 26 03/18/2019 1858   GLUCOSE 108 (H) 03/18/2019 1858   BUN 14 03/18/2019 1858   CREATININE 0.93 03/18/2019 1858   CALCIUM 10.0 03/18/2019 1858   PROT 7.5 03/03/2019 1025   ALBUMIN 4.3 03/03/2019 1025   AST 14 03/03/2019 1025   ALT 9 03/03/2019 1025   ALKPHOS 90 03/03/2019 1025   BILITOT 0.7 03/03/2019 1025   GFRNONAA 59 (L) 03/18/2019 1858   GFRAA >60 03/18/2019 1858   Lab Results  Component Value Date   WBC 6.7 03/18/2019   NEUTROABS 3.6 03/18/2019   HGB 14.9 03/18/2019   HCT 45.3 03/18/2019   MCV 90.8 03/18/2019   PLT 188 03/18/2019    Imaging:  MR BRAIN W WO CONTRAST  Result Date: 08/04/2019 CLINICAL DATA:  Tolosa Hunt  syndrome. EXAM: MRI HEAD WITHOUT AND WITH CONTRAST  TECHNIQUE: Multiplanar, multiecho pulse sequences of the brain and surrounding structures were obtained without and with intravenous contrast. CONTRAST:  33mL MULTIHANCE GADOBENATE DIMEGLUMINE 529 MG/ML IV SOLN COMPARISON:  03/18/2019 head CT. 01/23/2019 MRI head. FINDINGS: Brain: Mild diffuse parenchymal volume loss with ex vacuo dilatation. Minimal scattered T2/FLAIR hyperintense periventricular and subcortical white matter hyperintensities are nonspecific however commonly associated with chronic microvascular ischemic changes. No acute infarct or intracranial hemorrhage. No midline shift or extra-axial fluid collection. Asymmetric enhancing T2 hyperintense soft tissue involving the right cavernous sinus measuring 1.5 x 0.9 cm is unchanged (13:48). Normal appearance of the left cavernous sinus. No new sites of abnormal enhancement. Vascular: Normal flow voids. Skull and upper cervical spine: Normal marrow signal. Sinuses/Orbits: Normal orbits. Clear paranasal sinuses. No mastoid effusion. Other: None. IMPRESSION: Asymmetric enhancing right cavernous sinus lesion is unchanged. Differential includes pseudotumor, lymphoma, metastases and less likely meningioma. Mild cerebral atrophy. Minimal chronic microvascular ischemic changes. Electronically Signed   By: Primitivo Gauze M.D.   On: 08/04/2019 18:17     Assessment/Plan Cavernous Sinus Mass  We appreciate the opportunity to participate in the care of Yevette T Swalley.  Her cavernous sinus lesion is of unclear etiology- it could represent meningioma or non-neoplastic process such as granuloma.    Because of lack of symptoms and growth, we agreed with deferring biopsy or further workup.    We recommended she return in 1 year with MRI brain for evaluation, or sooner if symptoms develop.  Will continue to follow with PCP and Dr. Tomi Likens for management of Factor V, HTN and TIA.  We spent twenty  additional minutes teaching regarding the natural history, biology, and historical experience in the treatment of brain tumors. We then discussed in detail the current recommendations for therapy focusing on the mode of administration, mechanism of action, anticipated toxicities, and quality of life issues associated with this plan. We also provided teaching sheets for the patient to take home as an additional resource.  All questions were answered. The patient knows to call the clinic with any problems, questions or concerns. No barriers to learning were detected.  The total time spent in the encounter was 45 minutes and more than 50% was on counseling and review of test results   Ventura Sellers, MD Medical Director of Neuro-Oncology [facility] at [facility] 08/12/19 12:27 PM

## 2019-08-15 ENCOUNTER — Telehealth: Payer: Self-pay | Admitting: Family Medicine

## 2019-08-15 ENCOUNTER — Telehealth: Payer: Self-pay | Admitting: Internal Medicine

## 2019-08-15 NOTE — Telephone Encounter (Signed)
Scheduled appt per 5/14 los.  Spoke with pt and she is aware of the appt date and time.

## 2019-08-15 NOTE — Telephone Encounter (Signed)
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

## 2019-08-16 ENCOUNTER — Telehealth: Payer: Self-pay | Admitting: Oncology

## 2019-08-16 ENCOUNTER — Other Ambulatory Visit: Payer: Self-pay | Admitting: Gynecologic Oncology

## 2019-08-16 DIAGNOSIS — C541 Malignant neoplasm of endometrium: Secondary | ICD-10-CM

## 2019-08-16 NOTE — Telephone Encounter (Signed)
Called Matisen and advised her that Dr. Berline Lopes would like her to have a CT scan on Friday before her appointment.  Notified her of CT apt at 7:30 on 08/19/19. Also gave her an appointment for labs on 08/18/19 at 10 am and discussed that I will meet her with the contrast and instructions for the CT.  She verbalized understanding and agreement.

## 2019-08-16 NOTE — Progress Notes (Signed)
GYNECOLOGIC ONCOLOGY NEW PATIENT CONSULTATION   Patient Name: Bonnie Zhang  Patient Age: 79 y.o. Date of Service: 08/19/19 Referring Provider: Dr. 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  Discussed biopsy results and findings on exam today. Much of the anterior vagina has been replaced by adenocarcinoma. I suspect, given her extensive CAH on final hysterectomy specimen a number of years ago, that there may have been at least of focus of endometrioid adenocarcinoma (and thus this is recurrent cancer). She had a CT A/P earlier this morning that shows no evidence of metastatic disease.  Given local disease, I discussed treatment options with her including excision and radiation therapy. I will ask pathology to clarify the grade as well as to add stains (ER, PR and MMR). Due to her significant comorbidities as well as the tumor location, I think that surgical resection would be difficult. I favor treatment with radiation, but I plan to present her case at our tumor conference and will call her with the consensus of this review and discussion.   In terms of her candidiasis, I sent a prescription in for Nystatin powder. We discussed the use of melatonin as a sleep aid to see if her nocturia is related to difficulty sleeping and not a need to void.   A copy of this note was sent to the patient's referring provider.   50 minutes of total time was spent for this patient encounter, including preparation, face-to-face counseling with the patient and coordination of care, and documentation of the encounter.   Jeral Pinch, MD  Division of Gynecologic Oncology  Department of Obstetrics and Gynecology  [facility]   ___________________________________________  Chief Complaint: Chief Complaint  Patient presents with  . Endometrial cancer [facility])    New Patient    History of Present Illness:  Bonnie Zhang is a 79 y.o. y.o. female who is seen in consultation at the request of Dr. Kennon Rounds for an evaluation of newly diagnosed endometrioid adenocarcinoma.  The patient underwent surgery in the setting of endometrial hyperplasia back in 2014.  Final pathology revealed extensive complex atypical hyperplasia, no malignancy.  She has done well since that time until 2 weeks ago when she began having pink spotting.  This was minimal but she used a douche after a week of spotting and had some bright red bleeding subsequently.  She had no bleeding after that but scheduled an appointment and was seen by Dr. Kennon Rounds shortly after.  She had some bleeding with the exam and after but now has had cessation of bleeding.  Biopsy at the time of her visit with Dr. Kennon Rounds returned showing endometrioid adenocarcinoma.  She denies any pelvic pain.  She notes a decrease in appetite over the last week and has lost approximately 10 pounds.  She notes some early satiety and not feeling hungry and describes this as unintentional weight loss.  She denies any nausea or emesis.  She reports normal bowel function.  She endorses some nocturia although no frequency during the day.  She usually stops drinking liquids at 6 PM.  She is unsure whether she is getting up because she needs to urinate or because she is not sleeping well.  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  Given  some acute and brief symptoms in October, the patient was thought to have possibly had a TIA.  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  She was seen by Dr. Mickeal Skinner earlier this month in the setting of her cavernous sinus mass of unclear etiology.  Given the patient's lack of symptoms and no change in size, biopsy or further work-up was deferred and recommendation was made for repeat MRI in 1 year or sooner if she develops symptoms.  TREATMENT HISTORY: 2012: Endometrial sampling showed benign endometrium, polyps 08/05/2012: D&C and hysteroscopy, pathology revealed at least atypical complex endometrial hyperplasia with a focal area worrisome for well differentiated endometrioid carcinoma 09/14/2012: Total robotic hysterectomy, BSO, right pelvic lymph node dissection.  Frozen section with no evidence of cancer, possible hyperplasia.  Final pathology revealed extensive atypical complex hyperplasia.  4 right lymph nodes negative for malignancy. 08/09/2019: Patient presented to her OB/GYN with an episode of pink discharge followed by frank bleeding after using a douche.  Since that time has had continued brown discharge. 08/09/2019: Vaginal biopsy shows endometrioid adenocarcinoma  PAST MEDICAL HISTORY:  Past Medical History:  Diagnosis Date  . Anticoagulated on Coumadin 2001   2 blood clots  . Bruises easily    pt is on Coumadin;last dose of Coumadin 08/05/11 and then Lovenox started 08/07/11  . DVT (deep vein thrombosis) in pregnancy     1965 post C section;2001 with prolonged driving while on  HRT  . DVT (deep venous thrombosis) ([place]) 2001   Was on Prempro.    . Endometrioid adenocarcinoma of uterus ([place])   . GERD (gastroesophageal reflux disease)    Protonix prn  . High cholesterol    takes Simvastating daily  . History of colon polyps 2007   Dr Sharlett Iles  . Homocysteinemia ([place]) 01/23/2015  . Hx of colonic polyps    Dr Sharlett Iles  . Hx of migraines    yrs ago   . Hypertension    takes Benazepril,Amlodipine,and Metoprolol daily  . Osteoporosis   . PONV (postoperative nausea and vomiting)   . Primary localized  osteoarthritis of left knee   . PVD (peripheral vascular disease) ([place]) 1965, 2001   abnormal venous doppler findings due to recurrent DVT'S left leg  . Right knee DJD    knees  . Shortness of breath dyspnea   . Vitamin B12 deficiency (non anemic) 01/24/2015  . Vitamin D deficiency    takes VIt D daily     PAST SURGICAL HISTORY:  Past Surgical History:  Procedure Laterality Date  . ABDOMINAL HYSTERECTOMY    . APPENDECTOMY  1965  . BREAST LUMPECTOMY  1987  . CESAREAN SECTION  1961/1965/1966   X 3  . CHOLECYSTECTOMY  1997  . COLONOSCOPY W/ POLYPECTOMY  2007   Dr  Sharlett Iles; due? 2012  . DILATION AND CURETTAGE OF UTERUS  2012   uterine polyp, Dr Kennon Rounds  . ESOPHAGOGASTRODUODENOSCOPY  1997  . HYSTEROSCOPY WITH D & C N/A 08/05/2012   Procedure: DILATATION AND CURETTAGE /HYSTEROSCOPY;  Surgeon: Donnamae Jude, MD;  Location: [facility];  Service: Gynecology;  Laterality: N/A;  . ROBOTIC ASSISTED TOTAL HYSTERECTOMY WITH BILATERAL SALPINGO OOPHERECTOMY Right 09/14/2012   Procedure: ROBOTIC ASSISTED TOTAL HYSTERECTOMY WITH BILATERAL SALPINGO OOPHORECTOMY ,right pelvic LYMPHNODE dissection;  Surgeon: Imagene Gurney A. Alycia Rossetti, MD;  Location: [facility];  Service: Gynecology;  Laterality: Right;  . TOTAL KNEE ARTHROPLASTY  08/11/2011   Procedure: TOTAL KNEE ARTHROPLASTY;  Surgeon: Lorn Junes, MD;  Location: [place];  Service: Orthopedics;  Laterality: Right;  DR [place] THIS CASE  . TOTAL KNEE ARTHROPLASTY Left 09/11/2014  . TOTAL KNEE ARTHROPLASTY Left 09/11/2014   Procedure: TOTAL KNEE ARTHROPLASTY;  Surgeon: Elsie Saas, MD;  Location: [facility];  Service: Orthopedics;  Laterality: Left;  . WISDOM TOOTH EXTRACTION      OB/GYN HISTORY:  OB History  Gravida Para Term Preterm AB Living  _0 SAB TAB Ectopic Multiple Live Births          3    # Outcome Date GA Lbr Len/2nd Weight Sex Delivery Anes PTL Lv  3 Preterm           2 Term           1 Term             No LMP recorded.  Patient has had a hysterectomy.  Age at menarche: 75  Age at menopause: 4 Hx of HRT: yes Hx of STDs: denies Last pap: 2014 History of abnormal pap smears: no  SCREENING STUDIES:  Last mammogram: 2017  Last colonoscopy: 2009 Last bone mineral density: 2018  MEDICATIONS: Outpatient Encounter Medications as of 08/19/2019  Medication Sig  . acetaminophen (TYLENOL) 500 MG tablet Take 500 mg by mouth every 6 (six) hours as needed.  Marland Kitchen amLODipine (NORVASC) 5 MG tablet Take 1 tablet (5 mg total) by mouth daily. Take an extra 5 mg if BP is > 145/90  . benazepril (LOTENSIN) 40 MG tablet TAKE 1 TABLET BY MOUTH DAILY.  . metoprolol succinate (TOPROL-XL) 50 MG 24 hr tablet TAKE 1 TABLET (50 MG TOTAL) BY MOUTH DAILY. TAKE WITH OR IMMEDIATELY FOLLOWING A MEAL.  . pantoprazole (PROTONIX) 40 MG tablet TAKE 1 TABLET BY MOUTH ONCE DAILY AS NEEDED FOR  ACID  REFLUX  . simvastatin (ZOCOR) 10 MG tablet Take 1 tablet (10 mg total) by mouth at bedtime.  . vitamin B-12 (CYANOCOBALAMIN) 1000 MCG tablet Take 1,000 mcg by mouth daily.  Marland Kitchen warfarin (COUMADIN) 5 MG tablet Take 1/2 tablet daily except take 1 tablet on Mon and Thurs or take as directed by anticoagulation clinic  . nystatin (MYCOSTATIN/NYSTOP) powder Apply 1 application topically 3 (three) times daily.  Marland Kitchen spironolactone (ALDACTONE) 25 MG tablet Take 0.5 tablets (12.5 mg total) by mouth once for 1 dose.  . [DISCONTINUED] meclizine (ANTIVERT) 25 MG tablet Take 1 tablet (25 mg total) by mouth 3 (three) times daily as needed for dizziness. (Patient not taking: Reported on 08/09/2019)   Facility-Administered Encounter Medications as of 08/19/2019  Medication  . sodium chloride (PF) 0.9 % injection    ALLERGIES:  Allergies  Allergen Reactions  . Vicodin [Hydrocodone-Acetaminophen]     Nightmares  . Aspirin Other (See Comments)    upset stomach  . Atorvastatin Other (See Comments)    leg pain  . Hctz [Hydrochlorothiazide] Other (See Comments)     FATIGUE AND EXTREMELY LOW POTASSIUM     FAMILY HISTORY:  Family History  Problem Relation Age of Onset  . Kidney disease Mother        ? hypertensive  . Hypertension Mother   . Deep vein thrombosis Mother        post ankle fracture  . Leukemia Father   . Cancer Father   . Colon cancer  Maternal Grandmother   . Diabetes Other        cousins  . Hypertension Sister   . Anesthesia problems Neg Hx   . Hypotension Neg Hx   . Malignant hyperthermia Neg Hx   . Pseudochol deficiency Neg Hx   . Stroke Neg Hx   . Heart disease Neg Hx   . Breast cancer Neg Hx   . Ovarian cancer Neg Hx   . Uterine cancer Neg Hx      SOCIAL HISTORY:    Social Connections: Unknown  . Frequency of Communication with Friends and Family: Not on file  . Frequency of Social Gatherings with Friends and Family: Not on file  . Attends Religious Services: More than 4 times per year  . Active Member of Clubs or Organizations: Not on file  . Attends Archivist Meetings: Not on file  . Marital Status: Not on file    REVIEW OF SYSTEMS:  Pertinent positives as per HPI. Denies fevers, chills, fatigue. Denies hearing loss, neck lumps or masses, mouth sores, ringing in ears or voice changes. Denies cough or wheezing.  Denies shortness of breath. Denies chest pain or palpitations. Denies leg swelling. Denies abdominal distention, pain, blood in stools, constipation, diarrhea, nausea, vomiting. Denies pain with intercourse, dysuria, hematuria or incontinence. Denies hot flashes, pelvic pain.   Denies joint pain, back pain or muscle pain/cramps. Denies itching, rash, or wounds. Denies dizziness, headaches, numbness or seizures. Denies swollen lymph nodes or glands, denies easy bruising or bleeding. Denies anxiety, depression, confusion, or decreased concentration.  Physical Exam:  Vital Signs for this encounter:  Blood pressure (!) 119/53, pulse 60, temperature 98 F (36.7 C), temperature source Oral,  resp. rate 16, height _0  (1.549 m), weight 174 lb 6 oz (79.1 kg), SpO2 100 %. Body mass index is 32.95 kg/m. General: Alert, oriented, no acute distress.  HEENT: Normocephalic, atraumatic. Sclera anicteric.  Chest: Clear to auscultation bilaterally. No wheezes, rhonchi, or rales. Cardiovascular: Regular rate and rhythm, no murmurs, rubs, or gallops.  Abdomen: Obese. Normoactive bowel sounds. Soft, nondistended, nontender to palpation. No masses or hepatosplenomegaly appreciated. No palpable fluid wave.  Extremities: Grossly normal range of motion. Warm, well perfused. No edema bilaterally.  Skin: Some erythema noted below the patient's pannus along her C-section scar as well as in bilateral groins, left more than right consistent with Candida infection. Lymphatics: No cervical, supraclavicular, or inguinal adenopathy.  GU: Normal external female genitalia. Anterior vagina, from the vaginal cuff to within 2-3 cm of the introitus, is replaced by frondular friable tissue that appears glandular with atypical vasculature.  Bleeds easily with any manipulation.  Extends to bilateral sides of the vagina, posterior vagina appears uninvolved.  Bimanual exam confirms this with some small area of nodularity noted on the right sidewall.  No significant mass appreciated, no nodularity or masses appreciated on rectovaginal exam.  LABORATORY AND RADIOLOGIC DATA:  Outside medical records were reviewed to synthesize the above history, along with the history and physical obtained during the visit.   Lab Results  Component Value Date   WBC 6.7 03/18/2019   HGB 14.9 03/18/2019   HCT 45.3 03/18/2019   PLT 188 03/18/2019   GLUCOSE 110 (H) 08/18/2019   CHOL 153 03/03/2019   TRIG 135.0 03/03/2019   HDL 59.90 03/03/2019   LDLCALC 67 03/03/2019   ALT 9 03/03/2019   AST 14 03/03/2019   NA 141 08/18/2019   K 4.2 08/18/2019   CL 105 08/18/2019  CREATININE 0.98 08/18/2019   BUN 13 08/18/2019   CO2 27  08/18/2019   TSH 1.29 03/03/2019   INR 2.0 07/19/2019   HGBA1C 5.3 03/03/2019    CT A/P on 5/21: IMPRESSION: 1. No mass is identified in the region of the vaginal cuff. No pelvic adenopathy or evidence of metastatic disease in the abdomen. 2. Small benign left adrenal gland myelolipoma. 3. Status post cholecystectomy. No biliary dilatation.

## 2019-08-16 NOTE — Progress Notes (Signed)
CT ordered to evaluate for signs of metastatic disease given newly diagnosed endometrial cancer at the vaginal cuff with history of extensive endometrial hyperplasia on surgical specimen.

## 2019-08-18 ENCOUNTER — Other Ambulatory Visit: Payer: Self-pay

## 2019-08-18 ENCOUNTER — Inpatient Hospital Stay: Payer: Medicare Other

## 2019-08-18 DIAGNOSIS — Z86718 Personal history of other venous thrombosis and embolism: Secondary | ICD-10-CM | POA: Diagnosis not present

## 2019-08-18 DIAGNOSIS — Z8673 Personal history of transient ischemic attack (TIA), and cerebral infarction without residual deficits: Secondary | ICD-10-CM | POA: Diagnosis not present

## 2019-08-18 DIAGNOSIS — E559 Vitamin D deficiency, unspecified: Secondary | ICD-10-CM | POA: Diagnosis not present

## 2019-08-18 DIAGNOSIS — I10 Essential (primary) hypertension: Secondary | ICD-10-CM | POA: Diagnosis not present

## 2019-08-18 DIAGNOSIS — C541 Malignant neoplasm of endometrium: Secondary | ICD-10-CM

## 2019-08-18 DIAGNOSIS — D385 Neoplasm of uncertain behavior of other respiratory organs: Secondary | ICD-10-CM | POA: Diagnosis not present

## 2019-08-18 DIAGNOSIS — K219 Gastro-esophageal reflux disease without esophagitis: Secondary | ICD-10-CM | POA: Diagnosis not present

## 2019-08-18 DIAGNOSIS — E7800 Pure hypercholesterolemia, unspecified: Secondary | ICD-10-CM | POA: Diagnosis not present

## 2019-08-18 DIAGNOSIS — E538 Deficiency of other specified B group vitamins: Secondary | ICD-10-CM | POA: Diagnosis not present

## 2019-08-18 DIAGNOSIS — M810 Age-related osteoporosis without current pathological fracture: Secondary | ICD-10-CM | POA: Diagnosis not present

## 2019-08-18 DIAGNOSIS — Z7901 Long term (current) use of anticoagulants: Secondary | ICD-10-CM | POA: Diagnosis not present

## 2019-08-18 DIAGNOSIS — Z79899 Other long term (current) drug therapy: Secondary | ICD-10-CM | POA: Diagnosis not present

## 2019-08-18 LAB — BASIC METABOLIC PANEL
Anion gap: 9 (ref 5–15)
BUN: 13 mg/dL (ref 8–23)
CO2: 27 mmol/L (ref 22–32)
Calcium: 10.3 mg/dL (ref 8.9–10.3)
Chloride: 105 mmol/L (ref 98–111)
Creatinine, Ser: 0.98 mg/dL (ref 0.44–1.00)
GFR calc Af Amer: >60 mL/min (ref >60)
GFR calc non Af Amer: 56 mL/min — ABNORMAL LOW (ref >60)
Glucose, Bld: 110 mg/dL — ABNORMAL HIGH (ref 70–99)
Potassium: 4.2 mmol/L (ref 3.5–5.1)
Sodium: 141 mmol/L (ref 135–145)

## 2019-08-19 ENCOUNTER — Inpatient Hospital Stay (HOSPITAL_BASED_OUTPATIENT_CLINIC_OR_DEPARTMENT_OTHER): Payer: Medicare Other | Admitting: Gynecologic Oncology

## 2019-08-19 ENCOUNTER — Other Ambulatory Visit: Payer: Self-pay

## 2019-08-19 ENCOUNTER — Ambulatory Visit (HOSPITAL_COMMUNITY)
Admission: RE | Admit: 2019-08-19 | Discharge: 2019-08-19 | Disposition: A | Discharge status: Home / Self Care | Payer: Medicare Other | Source: Ambulatory Visit | Attending: Gynecologic Oncology | Admitting: Gynecologic Oncology

## 2019-08-19 ENCOUNTER — Encounter: Payer: Self-pay | Admitting: Gynecologic Oncology

## 2019-08-19 VITALS — BP 119/53 | HR 60 | Temp 98.0°F | Resp 16 | Ht 61.0 in | Wt 174.4 lb

## 2019-08-19 DIAGNOSIS — Z86718 Personal history of other venous thrombosis and embolism: Secondary | ICD-10-CM | POA: Diagnosis not present

## 2019-08-19 DIAGNOSIS — Z79899 Other long term (current) drug therapy: Secondary | ICD-10-CM | POA: Diagnosis not present

## 2019-08-19 DIAGNOSIS — I10 Essential (primary) hypertension: Secondary | ICD-10-CM | POA: Diagnosis not present

## 2019-08-19 DIAGNOSIS — C541 Malignant neoplasm of endometrium: Secondary | ICD-10-CM

## 2019-08-19 DIAGNOSIS — E538 Deficiency of other specified B group vitamins: Secondary | ICD-10-CM | POA: Diagnosis not present

## 2019-08-19 DIAGNOSIS — B379 Candidiasis, unspecified: Secondary | ICD-10-CM | POA: Insufficient documentation

## 2019-08-19 DIAGNOSIS — D385 Neoplasm of uncertain behavior of other respiratory organs: Secondary | ICD-10-CM | POA: Diagnosis not present

## 2019-08-19 DIAGNOSIS — M810 Age-related osteoporosis without current pathological fracture: Secondary | ICD-10-CM | POA: Diagnosis not present

## 2019-08-19 DIAGNOSIS — E7800 Pure hypercholesterolemia, unspecified: Secondary | ICD-10-CM | POA: Diagnosis not present

## 2019-08-19 DIAGNOSIS — Z8673 Personal history of transient ischemic attack (TIA), and cerebral infarction without residual deficits: Secondary | ICD-10-CM | POA: Diagnosis not present

## 2019-08-19 DIAGNOSIS — E559 Vitamin D deficiency, unspecified: Secondary | ICD-10-CM | POA: Diagnosis not present

## 2019-08-19 DIAGNOSIS — Z7901 Long term (current) use of anticoagulants: Secondary | ICD-10-CM | POA: Diagnosis not present

## 2019-08-19 DIAGNOSIS — K219 Gastro-esophageal reflux disease without esophagitis: Secondary | ICD-10-CM | POA: Diagnosis not present

## 2019-08-19 MED ORDER — SODIUM CHLORIDE (PF) 0.9 % IJ SOLN
INTRAMUSCULAR | Status: AC
  Filled 2019-08-19: qty 50

## 2019-08-19 MED ORDER — NYSTATIN 100000 UNIT/GM EX POWD: 1.0000 "application " | Freq: Three times a day (TID) | CUTANEOUS | 1 refills | Status: DC

## 2019-08-19 MED ORDER — IOHEXOL 300 MG/ML  SOLN
100.0000 mL | Freq: Once | INTRAMUSCULAR | Status: AC | PRN
  Administered 2019-08-19: 100 mL via INTRAVENOUS

## 2019-08-19 NOTE — Patient Instructions (Signed)
It was a pleasure meeting you today.  Based on your exam, I think that radiation treatment is likely the best option.  I will call you Monday after we discussed your case at our tumor conference.  If you have any questions or concerns you can call [phone].

## 2019-08-22 ENCOUNTER — Other Ambulatory Visit: Payer: Self-pay | Admitting: Oncology

## 2019-08-22 ENCOUNTER — Other Ambulatory Visit (HOSPITAL_COMMUNITY): Payer: Medicare Other

## 2019-08-22 ENCOUNTER — Telehealth: Payer: Self-pay | Admitting: Gynecologic Oncology

## 2019-08-22 DIAGNOSIS — C541 Malignant neoplasm of endometrium: Secondary | ICD-10-CM

## 2019-08-22 NOTE — Progress Notes (Signed)
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  Discussed biopsy results and findings on exam today. Much of the anterior vagina has been replaced by adenocarcinoma. I suspect, given her extensive CAH on final hysterectomy specimen a number of years ago, that there may have been at least of focus of endometrioid adenocarcinoma (and thus this is recurrent cancer). She had a CT A/P earlier this morning that shows no evidence of metastatic disease.  Past/Anticipated interventions by Gyn/Onc surgery, if any: none  Past/Anticipated interventions by medical oncology, if any: none  Weight changes, if any: wt. Loss 10 lbs in 3 months  Bowel/Bladder complaints, if any: frequency at night to see PCP  Nausea/Vomiting, if any: no  Pain issues, if any:  none  SAFETY ISSUES:  Prior radiation? none  Pacemaker/ICD? none  Possible current pregnancy? Hysterectomy  Is the patient on methotrexate? none    BP (!) 160/71 (BP Location: Left Arm, Patient Position: Sitting)   Pulse 65   Temp (!) 97.5 F (36.4 C) (Temporal)   Resp 18   Ht 5\' 1"  (1.549 m)   Wt 175 lb (79.4 kg)   SpO2 99%   BMI 33.07 kg/m    Wt Readings from Last 3 Encounters:  08/24/19 175 lb (79.4 kg)  08/19/19 174 lb 6 oz (79.1 kg)  08/12/19 176 lb 12.8 oz (80.2 kg)

## 2019-08-22 NOTE — Telephone Encounter (Signed)
Called the patient and discussed tumor board recommendations, to proceed with radiation to treat recurrent endometrial cancer.  Confirmed pathology is endometrioid grade 2.  Have asked pathology to add on MMR and hormone receptor testing.  We will get patient scheduled to see Dr. Sondra Come.  Jeral Pinch MD Gynecologic Oncology

## 2019-08-22 NOTE — Progress Notes (Signed)
Gynecologic Oncology Multi-Disciplinary Disposition Conference Note  Date of the Conference: 08/22/2019  Patient Name: Bonnie Zhang  Referring Provider: Dr. Kennon Rounds Primary GYN Oncologist: Dr. Berline Lopes  Stage/Disposition:  Grade 2 endometrioid adenocarcinoma.  Disposition is to external beam radiation and vaginal brachytherapy.   This Multidisciplinary conference took place involving physicians from [place], [place], Radiation Oncology, Pathology, Radiology along with the Gynecologic Oncology Nurse Practitioner and RN.  Comprehensive assessment of the patient's malignancy, staging, need for surgery, chemotherapy, radiation therapy, and need for further testing were reviewed. Supportive measures, both inpatient and following discharge were also discussed. The recommended plan of care is documented. Greater than 35 minutes were spent correlating and coordinating this patient's care.

## 2019-08-24 ENCOUNTER — Ambulatory Visit
Admission: RE | Admit: 2019-08-24 | Discharge: 2019-08-24 | Disposition: A | Discharge status: Home / Self Care | Payer: Medicare Other | Source: Ambulatory Visit | Attending: Radiation Oncology | Admitting: Radiation Oncology

## 2019-08-24 ENCOUNTER — Encounter: Payer: Self-pay | Admitting: Radiation Oncology

## 2019-08-24 ENCOUNTER — Other Ambulatory Visit: Payer: Self-pay

## 2019-08-24 VITALS — BP 160/71 | HR 65 | Temp 97.5°F | Resp 18 | Ht 61.0 in | Wt 175.0 lb

## 2019-08-24 DIAGNOSIS — Z86010 Personal history of colonic polyps: Secondary | ICD-10-CM | POA: Diagnosis not present

## 2019-08-24 DIAGNOSIS — E559 Vitamin D deficiency, unspecified: Secondary | ICD-10-CM | POA: Insufficient documentation

## 2019-08-24 DIAGNOSIS — I739 Peripheral vascular disease, unspecified: Secondary | ICD-10-CM | POA: Insufficient documentation

## 2019-08-24 DIAGNOSIS — M810 Age-related osteoporosis without current pathological fracture: Secondary | ICD-10-CM | POA: Insufficient documentation

## 2019-08-24 DIAGNOSIS — C541 Malignant neoplasm of endometrium: Secondary | ICD-10-CM

## 2019-08-24 DIAGNOSIS — Z7901 Long term (current) use of anticoagulants: Secondary | ICD-10-CM | POA: Diagnosis not present

## 2019-08-24 DIAGNOSIS — E7800 Pure hypercholesterolemia, unspecified: Secondary | ICD-10-CM | POA: Insufficient documentation

## 2019-08-24 DIAGNOSIS — I10 Essential (primary) hypertension: Secondary | ICD-10-CM | POA: Insufficient documentation

## 2019-08-24 DIAGNOSIS — H4940 Progressive external ophthalmoplegia, unspecified eye: Secondary | ICD-10-CM | POA: Diagnosis not present

## 2019-08-24 DIAGNOSIS — K219 Gastro-esophageal reflux disease without esophagitis: Secondary | ICD-10-CM | POA: Diagnosis not present

## 2019-08-24 DIAGNOSIS — E538 Deficiency of other specified B group vitamins: Secondary | ICD-10-CM | POA: Diagnosis not present

## 2019-08-24 DIAGNOSIS — Z90710 Acquired absence of both cervix and uterus: Secondary | ICD-10-CM | POA: Diagnosis not present

## 2019-08-24 DIAGNOSIS — Z86718 Personal history of other venous thrombosis and embolism: Secondary | ICD-10-CM | POA: Insufficient documentation

## 2019-08-24 DIAGNOSIS — Z806 Family history of leukemia: Secondary | ICD-10-CM | POA: Insufficient documentation

## 2019-08-24 DIAGNOSIS — Z79899 Other long term (current) drug therapy: Secondary | ICD-10-CM | POA: Diagnosis not present

## 2019-08-24 DIAGNOSIS — Z800 Family history of malignant neoplasm of digestive organs: Secondary | ICD-10-CM | POA: Insufficient documentation

## 2019-08-24 NOTE — Progress Notes (Signed)
Radiation Oncology         [phone] ________________________________  Initial Outpatient Consultation  Name: Bonnie Zhang MRN: [id number]  Date: 08/24/2019  DOB: 05-31-1940  [id number], Claudina Lick, MD  Dorothyann Gibbs, NP   REFERRING PHYSICIAN: Dorothyann Gibbs, NP  DIAGNOSIS: The encounter diagnosis was Endometrial cancer ([place]).  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 female who is accompanied by her daughter. The patient presented to Dr. Kennon Rounds on 08/09/2019 with complaint of three-week history of abnormal vaginal discharge/bleeding. A vaginal biopsy was performed and revealed endometrioid adenocarcinoma.  Of note, the patient has a history of complex endometrial hyperplasia with atypia in June of 2014. The patient underwent a hysterectomy. Final pathology report at that time was negative.   CT of abdomen/pelvis on 08/19/2019 showed a small benign left adrenal gland myelolipoma. There was no mass was identified in the region of the vaginal cuff. There was no pelvic adenopathy or evidence of metastatic disease in the abdomen.  The patient was seen by Dr. Berline Lopes on 08/19/2019. Dr. Berline Lopes believed that this is a recurrent cancer given her extensive CAH on final hysterectomy specimen back in June of 2014 and that there may have been at least a focus of endometrioid adenocarcinoma. Given local disease, they discussed excision and radiation therapy. Due to her significant co-morbidities and the tumor location, surgical resection would be difficult. Radiation therapy was favored in this case.  The patient's case was discussed at the [facility] on 08/22/2019. It was recommended that the patient proceed with external beam radiation and vaginal brachytherapy.  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 Results showed an asymmetric enhancing right cavernous sinus lesion that was unchanged. Following this, she was seen by Dr. Mickeal Skinner on 08/12/2019. Biopsy and further workup was deferred at that time secondary to lack of symptoms and growth.   PREVIOUS RADIATION THERAPY: No  PAST MEDICAL HISTORY:  Past Medical History:  Diagnosis Date  . Anticoagulated on Coumadin 2001   2 blood clots  . Bruises easily    pt is on Coumadin;last dose of Coumadin 08/05/11 and then Lovenox started 08/07/11  . DVT (deep vein thrombosis) in pregnancy     1965 post C section;2001 with prolonged driving while on  HRT  . DVT (deep venous thrombosis) ([place]) 2001   Was on Prempro.    . 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 GERD (gastroesophageal reflux disease)    Protonix prn  . High cholesterol    takes Simvastating daily  . History of colon polyps 2007   Dr Sharlett Iles  . 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 Hx of colonic polyps    Dr Sharlett Iles  . Hx of migraines    yrs ago   . Hypertension    takes Benazepril,Amlodipine,and Metoprolol daily  . Osteoporosis   . PONV (postoperative nausea and vomiting)   . Primary localized osteoarthritis of left knee   . PVD (peripheral vascular disease) ([place]) 1965, 2001   abnormal venous doppler findings due to recurrent DVT'S left leg  . Right knee DJD    knees  . Shortness of breath dyspnea   . Vitamin B12 deficiency (non anemic) 01/24/2015  . Vitamin D deficiency    takes VIt D daily    PAST SURGICAL HISTORY: Past Surgical History:  Procedure Laterality Date  . ABDOMINAL HYSTERECTOMY    . APPENDECTOMY  1965  . BREAST LUMPECTOMY  1987  . CESAREAN SECTION  1961/1965/1966   X 3  . CHOLECYSTECTOMY  1997  . COLONOSCOPY W/ POLYPECTOMY  2007   Dr  Sharlett Iles; due? 2012  . DILATION AND CURETTAGE OF UTERUS  2012   uterine polyp, Dr Kennon Rounds  . ESOPHAGOGASTRODUODENOSCOPY  1997  . HYSTEROSCOPY WITH D & C N/A 08/05/2012    Procedure: DILATATION AND CURETTAGE /HYSTEROSCOPY;  Surgeon: Donnamae Jude, MD;  Location: [facility];  Service: Gynecology;  Laterality: N/A;  . ROBOTIC ASSISTED TOTAL HYSTERECTOMY WITH BILATERAL SALPINGO OOPHERECTOMY Right 09/14/2012   Procedure: ROBOTIC ASSISTED TOTAL HYSTERECTOMY WITH BILATERAL SALPINGO OOPHORECTOMY ,right pelvic LYMPHNODE dissection;  Surgeon: Imagene Gurney A. Alycia Rossetti, MD;  Location: [facility];  Service: Gynecology;  Laterality: Right;  . TOTAL KNEE ARTHROPLASTY  08/11/2011   Procedure: TOTAL KNEE ARTHROPLASTY;  Surgeon: Lorn Junes, MD;  Location: [facility];  Service: Orthopedics;  Laterality: Right;  DR [place] THIS CASE  . TOTAL KNEE ARTHROPLASTY Left 09/11/2014  . TOTAL KNEE ARTHROPLASTY Left 09/11/2014   Procedure: TOTAL KNEE ARTHROPLASTY;  Surgeon: Elsie Saas, MD;  Location: [facility];  Service: Orthopedics;  Laterality: Left;  . WISDOM TOOTH EXTRACTION      FAMILY HISTORY:  Family History  Problem Relation Age of Onset  . Kidney disease Mother        ? hypertensive  . Hypertension Mother   . Deep vein thrombosis Mother        post ankle fracture  . Leukemia Father   . Cancer Father   . Colon cancer Maternal Grandmother   . Diabetes Other        cousins  . Hypertension Sister   . Anesthesia problems Neg Hx   . Hypotension Neg Hx   . Malignant hyperthermia Neg Hx   . Pseudochol deficiency Neg Hx   . Stroke Neg Hx   . Heart disease Neg Hx   . Breast cancer Neg Hx   . Ovarian cancer Neg Hx   . Uterine cancer Neg Hx     SOCIAL HISTORY:  Social History   Tobacco Use  . Smoking status: Never Smoker  . Smokeless tobacco: Never Used  Substance Use Topics  . Alcohol use: No  . Drug use: No    ALLERGIES:  Allergies  Allergen Reactions  . Vicodin [Hydrocodone-Acetaminophen]     Nightmares  . Aspirin Other (See Comments)    upset stomach  . Atorvastatin Other (See Comments)    leg pain  . Hctz [Hydrochlorothiazide] Other (See Comments)     FATIGUE AND EXTREMELY LOW POTASSIUM    MEDICATIONS:  Current Outpatient Medications  Medication Sig Dispense Refill  . acetaminophen (TYLENOL) 500 MG tablet Take 500 mg by mouth every 6 (six) hours as needed.    Marland Kitchen amLODipine (NORVASC) 5 MG tablet Take 1 tablet (5 mg total) by mouth daily. Take an extra 5 mg if BP is > 145/90 100 tablet 1  . benazepril (LOTENSIN) 40 MG tablet TAKE 1 TABLET BY MOUTH DAILY. 90 tablet 0  . metoprolol succinate (TOPROL-XL) 50 MG 24 hr tablet TAKE 1 TABLET (50 MG TOTAL) BY MOUTH DAILY. TAKE WITH OR IMMEDIATELY FOLLOWING A MEAL. 90 tablet 0  . nystatin (MYCOSTATIN/NYSTOP) powder Apply 1 application topically 3 (three) times daily. 15 g 1  . pantoprazole (PROTONIX) 40 MG tablet TAKE 1 TABLET BY MOUTH ONCE DAILY  AS NEEDED FOR  ACID  REFLUX 90 tablet 0  . simvastatin (ZOCOR) 10 MG tablet Take 1 tablet (10 mg total) by mouth at bedtime. 90 tablet 1  . spironolactone (ALDACTONE) 25 MG tablet Take 0.5 tablets (12.5 mg total) by mouth once for 1 dose. 45 tablet 1  . vitamin B-12 (CYANOCOBALAMIN) 1000 MCG tablet Take 1,000 mcg by mouth daily.    Marland Kitchen warfarin (COUMADIN) 5 MG tablet Take 1/2 tablet daily except take 1 tablet on Mon and Thurs or take as directed by anticoagulation clinic 90 tablet 1   No current facility-administered medications for this encounter.    REVIEW OF SYSTEMS:  A 10+ POINT REVIEW OF SYSTEMS WAS OBTAINED including neurology, dermatology, psychiatry, cardiac, respiratory, lymph, extremities, GI, GU, musculoskeletal, constitutional, reproductive, HEENT.  sHe denies any vaginal bleeding on a regular basis,  only associated with pelvic exams.  She denies any pain within the pelvis area flank pain.  She has had some chronic swelling in her left lower extremity since her prior surgery.  She was placed on anticoagulation for this issue.  She denies any hematuria or rectal bleeding.  She denies any difficulties with bowel movements or urination   PHYSICAL EXAM:   height is 5\' 1"  (1.549 m) and weight is 175 lb (79.4 kg). Her temporal temperature is 97.5 F (36.4 C) (abnormal). Her blood pressure is 160/71 (abnormal) and her pulse is 65. Her respiration is 18 and oxygen saturation is 99%.   General: Alert and oriented, in no acute distress HEENT: Head is normocephalic. Extraocular movements are intact.   Neck: Neck is supple, no palpable cervical or supraclavicular lymphadenopathy. Heart: Regular in rate and rhythm with no murmurs, rubs, or gallops. Chest: Clear to auscultation bilaterally, with no rhonchi, wheezes, or rales. Abdomen: Soft, nontender, nondistended, with no rigidity or guarding.  Apparent yeast infection in the skin folds of the lower abdomen.  Patient was given antifungal powder for this issue.  No palpable inguinal adenopathy Extremities: No cyanosis or edema. Lymphatics: see Neck Exam Skin: No concerning lesions. Musculoskeletal: symmetric strength and muscle tone throughout. Neurologic: Cranial nerves II through XII are grossly intact. No obvious focalities. Speech is fluent. Coordination is intact. Psychiatric: Judgment and insight are intact. Affect is appropriate. On pelvic examination the external genitalia were unremarkable. A speculum exam was performed. A friable lesion is noted at the vaginal cuff that bleeds easily and has glandular appearance.  There is thickening noted along the anterior vaginal wall extending to within 2 to 3 cm of the introitus.  The lesion extends along the left and right side of the vaginal walls but no obvious extension to the posterior vaginal area.  Rectovaginal exam confirms.   ECOG = 1  0 - Asymptomatic (Fully active, able to carry on all predisease activities without restriction)  1 - Symptomatic but completely ambulatory (Restricted in physically strenuous activity but ambulatory and able to carry out work of a light or sedentary nature. For example, light housework, office work)  2 -  Symptomatic, <50% in bed during the day (Ambulatory and capable of all self care but unable to carry out any work activities. Up and about more than 50% of waking hours)  3 - Symptomatic, >50% in bed, but not bedbound (Capable of only limited self-care, confined to bed or chair 50% or more of waking hours)  4 - Bedbound (Completely disabled. Cannot carry on any self-care. Totally confined to bed or chair)  5 - Death  Oken MM, Creech RH, Tormey DC, et al. [phone]). "Toxicity and response criteria of the [facility]". [place] Oncol. 5 (6): 649-55  LABORATORY DATA:  Lab Results  Component Value Date   WBC 6.7 03/18/2019   HGB 14.9 03/18/2019   HCT 45.3 03/18/2019   MCV 90.8 03/18/2019   PLT 188 03/18/2019   NEUTROABS 3.6 03/18/2019   Lab Results  Component Value Date   NA 141 08/18/2019   K 4.2 08/18/2019   CL 105 08/18/2019   CO2 27 08/18/2019   GLUCOSE 110 (H) 08/18/2019   CREATININE 0.98 08/18/2019   CALCIUM 10.3 08/18/2019      RADIOGRAPHY: MR BRAIN W WO CONTRAST  Result Date: 08/04/2019 CLINICAL DATA:  Tolosa Hunt syndrome. EXAM: MRI HEAD WITHOUT AND WITH CONTRAST TECHNIQUE: Multiplanar, multiecho pulse sequences of the brain and surrounding structures were obtained without and with intravenous contrast. CONTRAST:  69mL MULTIHANCE GADOBENATE DIMEGLUMINE 529 MG/ML IV SOLN COMPARISON:  03/18/2019 head CT. 01/23/2019 MRI head. FINDINGS: Brain: Mild diffuse parenchymal volume loss with ex vacuo dilatation. Minimal scattered T2/FLAIR hyperintense periventricular and subcortical white matter hyperintensities are nonspecific however commonly associated with chronic microvascular ischemic changes. No acute infarct or intracranial hemorrhage. No midline shift or extra-axial fluid collection. Asymmetric enhancing T2 hyperintense soft tissue involving the right cavernous sinus measuring 1.5 x 0.9 cm is unchanged (13:48). Normal appearance of the left cavernous  sinus. No new sites of abnormal enhancement. Vascular: Normal flow voids. Skull and upper cervical spine: Normal marrow signal. Sinuses/Orbits: Normal orbits. Clear paranasal sinuses. No mastoid effusion. Other: None. IMPRESSION: Asymmetric enhancing right cavernous sinus lesion is unchanged. Differential includes pseudotumor, lymphoma, metastases and less likely meningioma. Mild cerebral atrophy. Minimal chronic microvascular ischemic changes. Electronically Signed   By: Primitivo Gauze M.D.   On: 08/04/2019 18:17   CT Abdomen Pelvis W Contrast  Result Date: 08/19/2019 CLINICAL DATA:  Newly diagnosed endometrial cancer at the vaginal cuff. EXAM: CT ABDOMEN AND PELVIS WITH CONTRAST TECHNIQUE: Multidetector CT imaging of the abdomen and pelvis was performed using the standard protocol following bolus administration of intravenous contrast. CONTRAST:  171mL OMNIPAQUE IOHEXOL 300 MG/ML  SOLN COMPARISON:  None. FINDINGS: Lower chest: The lung bases are clear of an acute process. No worrisome pulmonary nodules. No pleural effusion or pleural nodules. The heart is normal in size. No pericardial effusion. Small hiatal hernia noted. Hepatobiliary: No hepatic lesions or intrahepatic biliary dilatation. Gallbladder is surgically absent. No common bile duct dilatation. Pancreas: No mass, inflammation or ductal dilatation. Spleen: Normal size. No focal lesions. Adrenals/Urinary Tract: Small fatty lesion associated with the left adrenal gland consistent with a benign adrenal gland myelolipoma. The right adrenal gland is normal. No worrisome renal lesions or hydroureteronephrosis. No bladder lesions or calculi. Stomach/Bowel: The stomach, duodenum, small bowel and colon are unremarkable. No acute inflammatory changes, mass lesions or obstructive findings. The terminal ileum is normal. The appendix is surgically absent. Sigmoid colon diverticulosis but no findings for acute diverticulitis. Vascular/Lymphatic: The aorta and  branch vessels are patent. Scattered atherosclerotic calcifications but no dissection. No mesenteric or retroperitoneal mass or lymphadenopathy. No pelvic adenopathy. Reproductive: The uterus and ovaries are surgically absent. No mass is identified at the vaginal cuff. Other: No free pelvic fluid collections or pelvic mass. No inguinal mass or adenopathy. Musculoskeletal: No significant bony findings. IMPRESSION: 1. No mass is identified in the region of the vaginal cuff. No pelvic adenopathy or evidence of metastatic disease in the abdomen. 2. Small  benign left adrenal gland myelolipoma. 3. Status post cholecystectomy. No biliary dilatation. Electronically Signed   By: Marijo Sanes M.D.   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  This would consist of external beam radiation therapy directed to the pelvis area followed by intracavitary brachytherapy treatments.  Today, I talked to the patient and daughter about the findings and work-up thus far.  We discussed the natural history of endometrial cancer and general treatment, highlighting the role of radiotherapy (intensity modulated radiation therapy and high-dose-rate vaginal brachytherapy) in the management.  We discussed the available radiation techniques, and focused on the details of logistics and delivery.  We reviewed the anticipated acute and late sequelae associated with radiation in this setting.  The patient was encouraged to ask questions that I answered to the best of my ability.  A patient consent form was discussed and signed.  We retained a copy for our records.  The patient would like to proceed with radiation and will be scheduled for CT simulation.  PLAN:  Patient will return early next week for CT simulation with treatments to begin in approximately 10 days.  If bleeding becomes more of an issue than her treatment will be expedited.  Anticipate 5 weeks of radiation therapy directed at the pelvis region followed by 4 intracavitary high dose rate brachytherapy treatments.    ------------------------------------------------  Blair Promise, PhD, MD  This document serves as a record of services personally performed by Gery Pray, MD. It was created on his behalf by Clerance Lav, a trained medical scribe. The creation of this record is based on the scribe's personal observations and the provider's statements to them. This document has been checked and approved by the attending provider.

## 2019-08-25 ENCOUNTER — Encounter: Payer: Medicare Other | Admitting: Family Medicine

## 2019-08-26 ENCOUNTER — Ambulatory Visit: Payer: Medicare Other | Admitting: Radiation Oncology

## 2019-08-30 ENCOUNTER — Other Ambulatory Visit: Payer: Self-pay

## 2019-08-30 ENCOUNTER — Ambulatory Visit
Admission: RE | Admit: 2019-08-30 | Discharge: 2019-08-30 | Disposition: A | Discharge status: Home / Self Care | Payer: Medicare Other | Source: Ambulatory Visit | Attending: Radiation Oncology | Admitting: Radiation Oncology

## 2019-08-30 DIAGNOSIS — C541 Malignant neoplasm of endometrium: Secondary | ICD-10-CM | POA: Diagnosis present

## 2019-08-30 DIAGNOSIS — Z510 Encounter for antineoplastic radiation therapy: Secondary | ICD-10-CM | POA: Diagnosis not present

## 2019-08-30 MED ORDER — SODIUM CHLORIDE 0.9% FLUSH
10.0000 mL | Freq: Once | INTRAVENOUS | Status: AC
  Administered 2019-08-30: 10 mL

## 2019-08-30 NOTE — Progress Notes (Signed)
Assistance requested from [place], [place] with IV needed for simulation. Started a left AC 22 gauge IV on the first attempt. Patient tolerated this well. IV secured and saline locked. Patient escorted to sim in no distress.

## 2019-08-30 NOTE — Progress Notes (Addendum)
Patient here for IV start prior to her Simulation today. IV started at left antecubital with # 22 gauge needle without problems by London Pepper, RN at 245 pm. Pt. Alert and oriented and walked over to the Simulation department at 3 pm for  Her 330 pm appointment. Dressing dry and intact at her left forearm.  1620 Patient back in clinic and saline lock/ IV  removed with tip intact. Pt. D/c'd home without problems.

## 2019-08-31 NOTE — Progress Notes (Signed)
Subjective:    Patient ID: Bonnie Zhang, female    DOB: December 09, 1940, 79 y.o.   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     She is taking all of her medications as prescribed.   She had vaginal bleeding and saw her gyn.  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  She will be starting radiation starting next Tuesday for 25 sessions.       She gets up 2-3 times a night to go to the bathroom.  She is able to get back to sleep easily.     Medications and allergies reviewed with patient and updated if appropriate.  Patient Active Problem List   Diagnosis Date Noted  . Endometrial cancer ([place]) 08/19/2019  . Candidiasis 08/19/2019  . Brain mass 08/12/2019  . TIA (transient ischemic attack) 01/03/2019  . Hyperglycemia 06/29/2017  . Vertigo 04/07/2017  . Long term (current) use of anticoagulants 01/07/2017  . Carotid artery disease ([place]) 02/01/2016  . Bilateral leg edema 06/21/2015  . Vitamin B12 deficiency (non anemic) 01/24/2015  . 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 Primary localized osteoarthritis of left knee   . Encounter for therapeutic drug monitoring 06/07/2013  . GERD (gastroesophageal reflux disease)   . PVD (peripheral vascular disease) ([place])   . 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 10/09/2010  . Osteoporosis 03/21/2010  . History of colonic polyps 03/20/2008  . Vitamin D deficiency 08/25/2007  . HYPERLIPIDEMIA 03/18/2007  . Dysmetabolic syndrome X [id number]  . Essential hypertension 03/18/2007  . DVT, HX OF 07/23/2006    Current Outpatient Medications on File Prior to Visit  Medication Sig Dispense Refill  . acetaminophen (TYLENOL) 500 MG tablet Take 500 mg by mouth every 6 (six) hours as  needed.    Marland Kitchen amLODipine (NORVASC) 5 MG tablet Take 1 tablet (5 mg total) by mouth daily. Take an extra 5 mg if BP is > 145/90 100 tablet 1  . benazepril (LOTENSIN) 40 MG tablet TAKE 1 TABLET BY MOUTH DAILY. 90 tablet 0  . metoprolol succinate (TOPROL-XL) 50 MG 24 hr tablet TAKE 1 TABLET (50 MG TOTAL) BY MOUTH DAILY. TAKE WITH OR IMMEDIATELY FOLLOWING A MEAL. 90 tablet 0  . nystatin (MYCOSTATIN/NYSTOP) powder Apply 1 application topically 3 (three) times daily. 15 g 1  . pantoprazole (PROTONIX) 40 MG tablet TAKE 1 TABLET BY MOUTH ONCE DAILY AS NEEDED FOR  ACID  REFLUX 90 tablet 0  . simvastatin (ZOCOR) 10 MG tablet Take 1 tablet (10 mg total) by mouth at bedtime. 90 tablet 1  . vitamin B-12 (CYANOCOBALAMIN) 1000 MCG tablet Take 1,000 mcg by mouth daily.    Marland Kitchen warfarin (COUMADIN) 5 MG tablet Take 1/2 tablet daily except take 1 tablet on Mon and Thurs or take as directed by anticoagulation clinic 90 tablet 1  . spironolactone (ALDACTONE) 25 MG tablet Take 0.5 tablets (12.5 mg total) by mouth once for 1 dose. 45 tablet 1   No current facility-administered medications on file prior to visit.    Past Medical History:  Diagnosis Date  . Anticoagulated on Coumadin 2001   2 blood clots  . Bruises easily    pt is on Coumadin;last dose of Coumadin 08/05/11 and then Lovenox started 08/07/11  . DVT (deep  vein thrombosis) in pregnancy     1965 post C section;2001 with prolonged driving while on  HRT  . DVT (deep venous thrombosis) ([place]) 2001   Was on Prempro.    . 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 GERD (gastroesophageal reflux disease)    Protonix prn  . High cholesterol    takes Simvastating daily  . History of colon polyps 2007   Dr Sharlett Iles  . 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 Hx of colonic polyps    Dr Sharlett Iles  . Hx of migraines    yrs ago   . Hypertension    takes Benazepril,Amlodipine,and Metoprolol daily  . Osteoporosis   . PONV (postoperative nausea and vomiting)   .  Primary localized osteoarthritis of left knee   . PVD (peripheral vascular disease) ([place]) 1965, 2001   abnormal venous doppler findings due to recurrent DVT'S left leg  . Right knee DJD    knees  . Shortness of breath dyspnea   . Vitamin B12 deficiency (non anemic) 01/24/2015  . Vitamin D deficiency    takes VIt D daily    Past Surgical History:  Procedure Laterality Date  . ABDOMINAL HYSTERECTOMY    . APPENDECTOMY  1965  . BREAST LUMPECTOMY  1987  . CESAREAN SECTION  1961/1965/1966   X 3  . CHOLECYSTECTOMY  1997  . COLONOSCOPY W/ POLYPECTOMY  2007   Dr  Sharlett Iles; due? 2012  . DILATION AND CURETTAGE OF UTERUS  2012   uterine polyp, Dr Kennon Rounds  . ESOPHAGOGASTRODUODENOSCOPY  1997  . HYSTEROSCOPY WITH D & C N/A 08/05/2012   Procedure: DILATATION AND CURETTAGE /HYSTEROSCOPY;  Surgeon: Donnamae Jude, MD;  Location: [facility];  Service: Gynecology;  Laterality: N/A;  . ROBOTIC ASSISTED TOTAL HYSTERECTOMY WITH BILATERAL SALPINGO OOPHERECTOMY Right 09/14/2012   Procedure: ROBOTIC ASSISTED TOTAL HYSTERECTOMY WITH BILATERAL SALPINGO OOPHORECTOMY ,right pelvic LYMPHNODE dissection;  Surgeon: Imagene Gurney A. Alycia Rossetti, MD;  Location: [facility];  Service: Gynecology;  Laterality: Right;  . TOTAL KNEE ARTHROPLASTY  08/11/2011   Procedure: TOTAL KNEE ARTHROPLASTY;  Surgeon: Lorn Junes, MD;  Location: [facility];  Service: Orthopedics;  Laterality: Right;  DR [place] THIS CASE  . TOTAL KNEE ARTHROPLASTY Left 09/11/2014  . TOTAL KNEE ARTHROPLASTY Left 09/11/2014   Procedure: TOTAL KNEE ARTHROPLASTY;  Surgeon: Elsie Saas, MD;  Location: [facility];  Service: Orthopedics;  Laterality: Left;  . WISDOM TOOTH EXTRACTION      Social History   Socioeconomic History  . Marital status: Divorced    Spouse name: Not on file  . Number of children: 2  . Years of education: Not on file  . Highest education level: High school graduate  Occupational History  . Occupation: Retired  Tobacco Use  . Smoking  status: Never Smoker  . Smokeless tobacco: Never Used  Substance and Sexual Activity  . Alcohol use: No  . Drug use: No  . Sexual activity: Never    Birth control/protection: Post-menopausal    Comment: retired. has 2 daughters  Other Topics Concern  . Not on file  Social History Narrative   Pt lives alone she has 2 daughter - right handed- drinks tea, soda sometimes - No regular exercise   Social Determinants of Health   Financial Resource Strain:   . Difficulty of Paying Living Expenses:   Food Insecurity:   . Worried About Charity fundraiser in the Last Year:   . [place] in the  Last Year:   Transportation Needs: No Transportation Needs  . Lack of Transportation (Medical): No  . Lack of Transportation (Non-Medical): No  Physical Activity:   . Days of Exercise per Week:   . Minutes of Exercise per Session:   Stress:   . Feeling of Stress :   Social Connections: Unknown  . Frequency of Communication with Friends and Family: Not on file  . Frequency of Social Gatherings with Friends and Family: Not on file  . Attends Religious Services: More than 4 times per year  . Active Member of Clubs or Organizations: Not on file  . Attends Archivist Meetings: Not on file  . Marital Status: Not on file    Family History  Problem Relation Age of Onset  . Kidney disease Mother        ? hypertensive  . Hypertension Mother   . Deep vein thrombosis Mother        post ankle fracture  . Leukemia Father   . Cancer Father   . Colon cancer Maternal Grandmother   . Diabetes Other        cousins  . Hypertension Sister   . Anesthesia problems Neg Hx   . Hypotension Neg Hx   . Malignant hyperthermia Neg Hx   . Pseudochol deficiency Neg Hx   . Stroke Neg Hx   . Heart disease Neg Hx   . Breast cancer Neg Hx   . Ovarian cancer Neg Hx   . Uterine cancer Neg Hx     Review of Systems  Constitutional: Negative for fever.  Respiratory: Negative for cough, shortness of  breath and wheezing.   Cardiovascular: Positive for leg swelling. Negative for chest pain and palpitations.  Genitourinary: Positive for frequency (2-3 night). Negative for dysuria and hematuria.  Neurological: Positive for headaches (occ).  Psychiatric/Behavioral: Negative for dysphoric mood. The patient is not nervous/anxious.        Objective:   Vitals:   09/01/19 0943  BP: 136/62  Pulse: (!) 54  Resp: 16  Temp: 98.2 F (36.8 C)  SpO2: 96%   BP Readings from Last 3 Encounters:  09/01/19 136/62  08/24/19 (!) 160/71  08/19/19 (!) 119/53   Wt Readings from Last 3 Encounters:  09/01/19 174 lb (78.9 kg)  08/24/19 175 lb (79.4 kg)  08/19/19 174 lb 6 oz (79.1 kg)   Body mass index is 32.88 kg/m.   Physical Exam    Constitutional: Appears well-developed and well-nourished. No distress.  HENT:  Head: Normocephalic and atraumatic.  Neck: Neck supple. No tracheal deviation present. No thyromegaly present.  No cervical lymphadenopathy Cardiovascular: Normal rate, regular rhythm and normal heart sounds.   No murmur heard. No carotid bruit .  No edema Pulmonary/Chest: Effort normal and breath sounds normal. No respiratory distress. No has no wheezes. No rales.  Skin: Skin is warm and dry. Not diaphoretic.  Psychiatric: Normal mood and affect. Behavior is normal.      Assessment & Plan:    See Problem List for Assessment and Plan of chronic medical problems.    This visit occurred during the SARS-CoV-2 public health emergency.  Safety protocols were in place, including screening questions prior to the visit, additional usage of staff PPE, and extensive cleaning of exam room while observing appropriate contact time as indicated for disinfecting solutions.

## 2019-08-31 NOTE — Addendum Note (Signed)
Encounter addended by: De Burrs, RN on: 08/31/2019 4:17 PM  Actions taken: Clinical Note Signed

## 2019-09-01 ENCOUNTER — Ambulatory Visit (INDEPENDENT_AMBULATORY_CARE_PROVIDER_SITE_OTHER): Payer: Medicare Other | Admitting: General Practice

## 2019-09-01 ENCOUNTER — Other Ambulatory Visit: Payer: Self-pay

## 2019-09-01 ENCOUNTER — Ambulatory Visit (INDEPENDENT_AMBULATORY_CARE_PROVIDER_SITE_OTHER): Payer: Medicare Other | Admitting: Internal Medicine

## 2019-09-01 ENCOUNTER — Encounter: Payer: Self-pay | Admitting: Internal Medicine

## 2019-09-01 VITALS — BP 136/62 | HR 54 | Temp 98.2°F | Resp 16 | Ht 61.0 in | Wt 174.0 lb

## 2019-09-01 DIAGNOSIS — R739 Hyperglycemia, unspecified: Secondary | ICD-10-CM

## 2019-09-01 DIAGNOSIS — C541 Malignant neoplasm of endometrium: Secondary | ICD-10-CM

## 2019-09-01 DIAGNOSIS — D6851 Activated protein C resistance: Secondary | ICD-10-CM | POA: Diagnosis not present

## 2019-09-01 DIAGNOSIS — Z7901 Long term (current) use of anticoagulants: Secondary | ICD-10-CM | POA: Diagnosis not present

## 2019-09-01 DIAGNOSIS — E782 Mixed hyperlipidemia: Secondary | ICD-10-CM | POA: Diagnosis not present

## 2019-09-01 DIAGNOSIS — I10 Essential (primary) hypertension: Secondary | ICD-10-CM

## 2019-09-01 DIAGNOSIS — Z86718 Personal history of other venous thrombosis and embolism: Secondary | ICD-10-CM

## 2019-09-01 DIAGNOSIS — K219 Gastro-esophageal reflux disease without esophagitis: Secondary | ICD-10-CM | POA: Diagnosis not present

## 2019-09-01 LAB — POCT INR: INR: 1.9 — AB (ref 2.0–3.0)

## 2019-09-01 MED ORDER — SPIRONOLACTONE 25 MG PO TABS: 12.5000 mg | ORAL_TABLET | Freq: Every day | ORAL | 1 refills | Status: DC

## 2019-09-01 MED ORDER — METOPROLOL SUCCINATE ER 50 MG PO TB24: 50.0000 mg | ORAL_TABLET | Freq: Every day | ORAL | 1 refills | Status: DC

## 2019-09-01 MED ORDER — BENAZEPRIL HCL 40 MG PO TABS: 40.0000 mg | ORAL_TABLET | Freq: Every day | ORAL | 1 refills | Status: DC

## 2019-09-01 MED ORDER — AMLODIPINE BESYLATE 5 MG PO TABS: 5.0000 mg | ORAL_TABLET | Freq: Every day | ORAL | 1 refills | Status: DC

## 2019-09-01 NOTE — Assessment & Plan Note (Signed)
Acute Recurrence To start radiation next week

## 2019-09-01 NOTE — Assessment & Plan Note (Signed)
Chronic Lifelong a/c Following with Jenny Reichmann - saw her today

## 2019-09-01 NOTE — Progress Notes (Signed)
Agree with management.  Bryttani Blew J Khamari Yousuf, MD  

## 2019-09-01 NOTE — Assessment & Plan Note (Signed)
Chronic Check lipid panel  Continue daily statin Regular exercise and healthy diet encouraged  

## 2019-09-01 NOTE — Assessment & Plan Note (Signed)
Chronic BP well controlled Current regimen effective and well tolerated Continue current medications at current doses cmp  

## 2019-09-01 NOTE — Patient Instructions (Signed)
Pre visit review using our clinic review tool, if applicable. No additional management support is needed unless otherwise documented below in the visit note.  Change dosage and take 1/2 tablet daily except 1 tablet on Monday Wednesday and Fridays.  Re-check in 3 weeks.

## 2019-09-01 NOTE — Patient Instructions (Signed)
   Medications reviewed and updated.  Changes include :   none  Your prescription(s) have been submitted to your pharmacy. Please take as directed and contact our office if you believe you are having problem(s) with the medication(s).    Please followup in 6 months   

## 2019-09-01 NOTE — Assessment & Plan Note (Signed)
Chronic Check a1c Low sugar / carb diet Stressed regular exercise  

## 2019-09-01 NOTE — Assessment & Plan Note (Signed)
Chronic GERD controlled Continue as needed medication  

## 2019-09-05 NOTE — Progress Notes (Signed)
  Radiation Oncology         [phone] ________________________________  Name: LENNY BOUCHILLON MRN: [id number]  Date: 09/06/2019  DOB: 10-12-1940  Simulation Verification Note    ICD-10-CM   1. Endometrial cancer ([place])  C54.1     Status: outpatient   NARRATIVE: The patient was brought to the treatment unit and placed in the planned treatment position. The clinical setup was verified. Then port films were obtained and uploaded to the radiation oncology medical record software. The treatment beams were carefully compared against the planned radiation fields. The position location and shape of the radiation fields was reviewed. They targeted volume of tissue appears to be appropriately covered by the radiation beams. Organs at risk appear to be excluded as planned.  Based on my personal review, I approved the simulation verification. The patient's treatment will proceed as planned.   -----------------------------------  Blair Promise, PhD, MD  This document serves as a record of services personally performed by Gery Pray, MD. It was created on his behalf by Clerance Lav, a trained medical scribe. The creation of this record is based on the scribe's personal observations and the provider's statements to them. This document has been checked and approved by the attending provider.

## 2019-09-06 ENCOUNTER — Ambulatory Visit
Admission: RE | Admit: 2019-09-06 | Discharge: 2019-09-06 | Disposition: A | Discharge status: Home / Self Care | Payer: Medicare Other | Source: Ambulatory Visit | Attending: Radiation Oncology | Admitting: Radiation Oncology

## 2019-09-06 ENCOUNTER — Other Ambulatory Visit: Payer: Self-pay

## 2019-09-06 DIAGNOSIS — Z510 Encounter for antineoplastic radiation therapy: Secondary | ICD-10-CM | POA: Diagnosis not present

## 2019-09-06 DIAGNOSIS — C541 Malignant neoplasm of endometrium: Secondary | ICD-10-CM

## 2019-09-07 ENCOUNTER — Ambulatory Visit
Admission: RE | Admit: 2019-09-07 | Discharge: 2019-09-07 | Disposition: A | Discharge status: Home / Self Care | Payer: Medicare Other | Source: Ambulatory Visit | Attending: Radiation Oncology | Admitting: Radiation Oncology

## 2019-09-07 ENCOUNTER — Other Ambulatory Visit: Payer: Self-pay

## 2019-09-07 DIAGNOSIS — Z510 Encounter for antineoplastic radiation therapy: Secondary | ICD-10-CM | POA: Diagnosis not present

## 2019-09-08 ENCOUNTER — Other Ambulatory Visit: Payer: Self-pay

## 2019-09-08 ENCOUNTER — Ambulatory Visit
Admission: RE | Admit: 2019-09-08 | Discharge: 2019-09-08 | Disposition: A | Discharge status: Home / Self Care | Payer: Medicare Other | Source: Ambulatory Visit | Attending: Radiation Oncology | Admitting: Radiation Oncology

## 2019-09-08 DIAGNOSIS — Z510 Encounter for antineoplastic radiation therapy: Secondary | ICD-10-CM | POA: Diagnosis not present

## 2019-09-09 ENCOUNTER — Other Ambulatory Visit: Payer: Self-pay

## 2019-09-09 ENCOUNTER — Ambulatory Visit
Admission: RE | Admit: 2019-09-09 | Discharge: 2019-09-09 | Disposition: A | Discharge status: Home / Self Care | Payer: Medicare Other | Source: Ambulatory Visit | Attending: Radiation Oncology | Admitting: Radiation Oncology

## 2019-09-09 DIAGNOSIS — Z510 Encounter for antineoplastic radiation therapy: Secondary | ICD-10-CM | POA: Diagnosis not present

## 2019-09-12 ENCOUNTER — Other Ambulatory Visit: Payer: Self-pay

## 2019-09-12 ENCOUNTER — Ambulatory Visit
Admission: RE | Admit: 2019-09-12 | Discharge: 2019-09-12 | Disposition: A | Discharge status: Home / Self Care | Payer: Medicare Other | Source: Ambulatory Visit | Attending: Radiation Oncology | Admitting: Radiation Oncology

## 2019-09-12 DIAGNOSIS — Z510 Encounter for antineoplastic radiation therapy: Secondary | ICD-10-CM | POA: Diagnosis not present

## 2019-09-13 ENCOUNTER — Ambulatory Visit
Admission: RE | Admit: 2019-09-13 | Discharge: 2019-09-13 | Disposition: A | Discharge status: Home / Self Care | Payer: Medicare Other | Source: Ambulatory Visit | Attending: Radiation Oncology | Admitting: Radiation Oncology

## 2019-09-13 ENCOUNTER — Other Ambulatory Visit: Payer: Self-pay

## 2019-09-13 DIAGNOSIS — Z510 Encounter for antineoplastic radiation therapy: Secondary | ICD-10-CM | POA: Diagnosis not present

## 2019-09-14 ENCOUNTER — Ambulatory Visit
Admission: RE | Admit: 2019-09-14 | Discharge: 2019-09-14 | Disposition: A | Discharge status: Home / Self Care | Payer: Medicare Other | Source: Ambulatory Visit | Attending: Radiation Oncology | Admitting: Radiation Oncology

## 2019-09-14 ENCOUNTER — Other Ambulatory Visit: Payer: Self-pay

## 2019-09-14 DIAGNOSIS — Z510 Encounter for antineoplastic radiation therapy: Secondary | ICD-10-CM | POA: Diagnosis not present

## 2019-09-15 ENCOUNTER — Ambulatory Visit
Admission: RE | Admit: 2019-09-15 | Discharge: 2019-09-15 | Disposition: A | Discharge status: Home / Self Care | Payer: Medicare Other | Source: Ambulatory Visit | Attending: Radiation Oncology | Admitting: Radiation Oncology

## 2019-09-15 ENCOUNTER — Other Ambulatory Visit: Payer: Self-pay

## 2019-09-15 DIAGNOSIS — Z510 Encounter for antineoplastic radiation therapy: Secondary | ICD-10-CM | POA: Diagnosis not present

## 2019-09-16 ENCOUNTER — Ambulatory Visit
Admission: RE | Admit: 2019-09-16 | Discharge: 2019-09-16 | Disposition: A | Discharge status: Home / Self Care | Payer: Medicare Other | Source: Ambulatory Visit | Attending: Radiation Oncology | Admitting: Radiation Oncology

## 2019-09-16 DIAGNOSIS — Z510 Encounter for antineoplastic radiation therapy: Secondary | ICD-10-CM | POA: Diagnosis not present

## 2019-09-19 ENCOUNTER — Other Ambulatory Visit: Payer: Self-pay | Admitting: Internal Medicine

## 2019-09-19 ENCOUNTER — Ambulatory Visit
Admission: RE | Admit: 2019-09-19 | Discharge: 2019-09-19 | Disposition: A | Discharge status: Home / Self Care | Payer: Medicare Other | Source: Ambulatory Visit | Attending: Radiation Oncology | Admitting: Radiation Oncology

## 2019-09-19 ENCOUNTER — Other Ambulatory Visit: Payer: Self-pay

## 2019-09-19 DIAGNOSIS — Z510 Encounter for antineoplastic radiation therapy: Secondary | ICD-10-CM | POA: Diagnosis not present

## 2019-09-20 ENCOUNTER — Ambulatory Visit
Admission: RE | Admit: 2019-09-20 | Discharge: 2019-09-20 | Disposition: A | Discharge status: Home / Self Care | Payer: Medicare Other | Source: Ambulatory Visit | Attending: Radiation Oncology | Admitting: Radiation Oncology

## 2019-09-20 ENCOUNTER — Other Ambulatory Visit: Payer: Self-pay

## 2019-09-20 DIAGNOSIS — Z510 Encounter for antineoplastic radiation therapy: Secondary | ICD-10-CM | POA: Diagnosis not present

## 2019-09-21 ENCOUNTER — Ambulatory Visit
Admission: RE | Admit: 2019-09-21 | Discharge: 2019-09-21 | Disposition: A | Discharge status: Home / Self Care | Payer: Medicare Other | Source: Ambulatory Visit | Attending: Radiation Oncology | Admitting: Radiation Oncology

## 2019-09-21 ENCOUNTER — Other Ambulatory Visit: Payer: Self-pay

## 2019-09-21 DIAGNOSIS — Z510 Encounter for antineoplastic radiation therapy: Secondary | ICD-10-CM | POA: Diagnosis not present

## 2019-09-22 ENCOUNTER — Other Ambulatory Visit: Payer: Self-pay

## 2019-09-22 ENCOUNTER — Ambulatory Visit (INDEPENDENT_AMBULATORY_CARE_PROVIDER_SITE_OTHER): Payer: Medicare Other | Admitting: General Practice

## 2019-09-22 ENCOUNTER — Ambulatory Visit
Admission: RE | Admit: 2019-09-22 | Discharge: 2019-09-22 | Disposition: A | Discharge status: Home / Self Care | Payer: Medicare Other | Source: Ambulatory Visit | Attending: Radiation Oncology | Admitting: Radiation Oncology

## 2019-09-22 DIAGNOSIS — Z86718 Personal history of other venous thrombosis and embolism: Secondary | ICD-10-CM | POA: Diagnosis not present

## 2019-09-22 DIAGNOSIS — Z7901 Long term (current) use of anticoagulants: Secondary | ICD-10-CM

## 2019-09-22 DIAGNOSIS — Z510 Encounter for antineoplastic radiation therapy: Secondary | ICD-10-CM | POA: Diagnosis not present

## 2019-09-22 LAB — POCT INR: INR: 6.1 — AB (ref 2.0–3.0)

## 2019-09-22 NOTE — Progress Notes (Signed)
Agree with management.  Gila Lauf J Jahrell Hamor, MD  

## 2019-09-22 NOTE — Patient Instructions (Addendum)
Pre visit review using our clinic review tool, if applicable. No additional management support is needed unless otherwise documented below in the visit note.  Stop coumadin!  Re-check next Tuesday.  Go to ER if any unusual bleeding occurs!    

## 2019-09-23 ENCOUNTER — Ambulatory Visit
Admission: RE | Admit: 2019-09-23 | Discharge: 2019-09-23 | Disposition: A | Discharge status: Home / Self Care | Payer: Medicare Other | Source: Ambulatory Visit | Attending: Radiation Oncology | Admitting: Radiation Oncology

## 2019-09-23 DIAGNOSIS — Z510 Encounter for antineoplastic radiation therapy: Secondary | ICD-10-CM | POA: Diagnosis not present

## 2019-09-26 ENCOUNTER — Other Ambulatory Visit: Payer: Self-pay

## 2019-09-26 ENCOUNTER — Ambulatory Visit
Admission: RE | Admit: 2019-09-26 | Discharge: 2019-09-26 | Disposition: A | Discharge status: Home / Self Care | Payer: Medicare Other | Source: Ambulatory Visit | Attending: Radiation Oncology | Admitting: Radiation Oncology

## 2019-09-26 DIAGNOSIS — Z510 Encounter for antineoplastic radiation therapy: Secondary | ICD-10-CM | POA: Diagnosis not present

## 2019-09-27 ENCOUNTER — Ambulatory Visit (INDEPENDENT_AMBULATORY_CARE_PROVIDER_SITE_OTHER): Payer: Medicare Other | Admitting: General Practice

## 2019-09-27 ENCOUNTER — Ambulatory Visit
Admission: RE | Admit: 2019-09-27 | Discharge: 2019-09-27 | Disposition: A | Discharge status: Home / Self Care | Payer: Medicare Other | Source: Ambulatory Visit | Attending: Radiation Oncology | Admitting: Radiation Oncology

## 2019-09-27 ENCOUNTER — Other Ambulatory Visit: Payer: Self-pay

## 2019-09-27 ENCOUNTER — Ambulatory Visit: Payer: Self-pay | Admitting: General Practice

## 2019-09-27 DIAGNOSIS — Z7901 Long term (current) use of anticoagulants: Secondary | ICD-10-CM

## 2019-09-27 DIAGNOSIS — Z510 Encounter for antineoplastic radiation therapy: Secondary | ICD-10-CM | POA: Diagnosis not present

## 2019-09-27 DIAGNOSIS — Z86718 Personal history of other venous thrombosis and embolism: Secondary | ICD-10-CM

## 2019-09-27 LAB — POCT INR: INR: 1.5 — AB (ref 2.0–3.0)

## 2019-09-27 NOTE — Patient Instructions (Addendum)
Pre visit review using our clinic review tool, if applicable. No additional management support is needed unless otherwise documented below in the visit note.  Take 5 mg (1 tablet) today and tomorrow.  On Thursday start taking 1/2 daily except 1 tablet on Monday and Fridays.  Re-check in 2 weeks.

## 2019-09-28 ENCOUNTER — Ambulatory Visit
Admission: RE | Admit: 2019-09-28 | Discharge: 2019-09-28 | Disposition: A | Discharge status: Home / Self Care | Payer: Medicare Other | Source: Ambulatory Visit | Attending: Radiation Oncology | Admitting: Radiation Oncology

## 2019-09-28 ENCOUNTER — Other Ambulatory Visit: Payer: Self-pay

## 2019-09-28 DIAGNOSIS — Z510 Encounter for antineoplastic radiation therapy: Secondary | ICD-10-CM | POA: Diagnosis not present

## 2019-09-28 NOTE — Progress Notes (Signed)
Agree with management.  Ragnar Waas J Karina Lenderman, MD  

## 2019-09-29 ENCOUNTER — Other Ambulatory Visit: Payer: Self-pay

## 2019-09-29 ENCOUNTER — Ambulatory Visit
Admission: RE | Admit: 2019-09-29 | Discharge: 2019-09-29 | Disposition: A | Discharge status: Home / Self Care | Payer: Medicare Other | Source: Ambulatory Visit | Attending: Radiation Oncology | Admitting: Radiation Oncology

## 2019-09-29 DIAGNOSIS — C541 Malignant neoplasm of endometrium: Secondary | ICD-10-CM | POA: Diagnosis present

## 2019-09-29 DIAGNOSIS — R300 Dysuria: Secondary | ICD-10-CM | POA: Diagnosis not present

## 2019-09-30 ENCOUNTER — Ambulatory Visit
Admission: RE | Admit: 2019-09-30 | Discharge: 2019-09-30 | Disposition: A | Discharge status: Home / Self Care | Payer: Medicare Other | Source: Ambulatory Visit | Attending: Radiation Oncology | Admitting: Radiation Oncology

## 2019-09-30 ENCOUNTER — Other Ambulatory Visit: Payer: Self-pay

## 2019-09-30 DIAGNOSIS — R300 Dysuria: Secondary | ICD-10-CM | POA: Diagnosis not present

## 2019-09-30 DIAGNOSIS — C541 Malignant neoplasm of endometrium: Secondary | ICD-10-CM | POA: Diagnosis not present

## 2019-10-04 ENCOUNTER — Other Ambulatory Visit: Payer: Self-pay

## 2019-10-04 ENCOUNTER — Ambulatory Visit
Admission: RE | Admit: 2019-10-04 | Discharge: 2019-10-04 | Disposition: A | Discharge status: Home / Self Care | Payer: Medicare Other | Source: Ambulatory Visit | Attending: Radiation Oncology | Admitting: Radiation Oncology

## 2019-10-04 ENCOUNTER — Other Ambulatory Visit: Payer: Self-pay | Admitting: Radiation Oncology

## 2019-10-04 DIAGNOSIS — C541 Malignant neoplasm of endometrium: Secondary | ICD-10-CM | POA: Diagnosis not present

## 2019-10-04 DIAGNOSIS — R300 Dysuria: Secondary | ICD-10-CM | POA: Diagnosis not present

## 2019-10-05 ENCOUNTER — Other Ambulatory Visit: Payer: Self-pay

## 2019-10-05 ENCOUNTER — Ambulatory Visit
Admission: RE | Admit: 2019-10-05 | Discharge: 2019-10-05 | Disposition: A | Discharge status: Home / Self Care | Payer: Medicare Other | Source: Ambulatory Visit | Attending: Radiation Oncology | Admitting: Radiation Oncology

## 2019-10-05 DIAGNOSIS — R300 Dysuria: Secondary | ICD-10-CM | POA: Diagnosis not present

## 2019-10-05 DIAGNOSIS — C541 Malignant neoplasm of endometrium: Secondary | ICD-10-CM | POA: Diagnosis not present

## 2019-10-06 ENCOUNTER — Ambulatory Visit
Admission: RE | Admit: 2019-10-06 | Discharge: 2019-10-06 | Disposition: A | Discharge status: Home / Self Care | Payer: Medicare Other | Source: Ambulatory Visit | Attending: Radiation Oncology | Admitting: Radiation Oncology

## 2019-10-06 ENCOUNTER — Telehealth: Payer: Self-pay

## 2019-10-06 ENCOUNTER — Other Ambulatory Visit: Payer: Self-pay

## 2019-10-06 DIAGNOSIS — C541 Malignant neoplasm of endometrium: Secondary | ICD-10-CM | POA: Diagnosis not present

## 2019-10-06 DIAGNOSIS — R300 Dysuria: Secondary | ICD-10-CM | POA: Diagnosis not present

## 2019-10-06 NOTE — Telephone Encounter (Signed)
Verified with Melissa Cross,NP that Luvena OTC vaginal moisturizer is what was recommended for the itching that she was experiencing. She can apply the moisturizer on the outside of the vagina. Pt verbalized understanding.

## 2019-10-07 ENCOUNTER — Other Ambulatory Visit: Payer: Self-pay

## 2019-10-07 ENCOUNTER — Ambulatory Visit
Admission: RE | Admit: 2019-10-07 | Discharge: 2019-10-07 | Disposition: A | Discharge status: Home / Self Care | Payer: Medicare Other | Source: Ambulatory Visit | Attending: Radiation Oncology | Admitting: Radiation Oncology

## 2019-10-07 DIAGNOSIS — R300 Dysuria: Secondary | ICD-10-CM | POA: Diagnosis not present

## 2019-10-07 DIAGNOSIS — C541 Malignant neoplasm of endometrium: Secondary | ICD-10-CM | POA: Diagnosis not present

## 2019-10-10 ENCOUNTER — Telehealth: Payer: Self-pay | Admitting: *Deleted

## 2019-10-10 ENCOUNTER — Other Ambulatory Visit: Payer: Self-pay | Admitting: Radiation Oncology

## 2019-10-10 ENCOUNTER — Other Ambulatory Visit: Payer: Self-pay

## 2019-10-10 ENCOUNTER — Ambulatory Visit
Admission: RE | Admit: 2019-10-10 | Discharge: 2019-10-10 | Disposition: A | Discharge status: Home / Self Care | Payer: Medicare Other | Source: Ambulatory Visit | Attending: Radiation Oncology | Admitting: Radiation Oncology

## 2019-10-10 DIAGNOSIS — C541 Malignant neoplasm of endometrium: Secondary | ICD-10-CM

## 2019-10-10 DIAGNOSIS — R300 Dysuria: Secondary | ICD-10-CM | POA: Diagnosis not present

## 2019-10-10 LAB — URINALYSIS, COMPLETE (UACMP) WITH MICROSCOPIC
Bilirubin Urine: NEGATIVE
Glucose, UA: NEGATIVE mg/dL
Ketones, ur: 5 mg/dL — AB
Nitrite: NEGATIVE
Protein, ur: NEGATIVE mg/dL
Specific Gravity, Urine: 1.017 (ref 1.005–1.030)
pH: 5 (ref 5.0–8.0)

## 2019-10-10 NOTE — Telephone Encounter (Signed)
Spoke with Elmo Putt Nurse Navigator.  She will review symptoms with Dr. Sondra Come and his nurse. Told Ms Brann that she needs to let the therapist know that she needs to be seen by Dr. Sondra Come prior to treatment today due to symptoms. Pt verbalized understanding.

## 2019-10-10 NOTE — Telephone Encounter (Signed)
Patient called and left a message stating "I called last week and was told to start using Luvena. I have used it all weekend with no relief. I still have itching and burning; the lips of the vagina ae swollen. I can't because of the pain and I'm having trouble walking because of this." Patient stated that she will be unable from 12-2 due to coming for radiation.

## 2019-10-11 ENCOUNTER — Ambulatory Visit: Payer: Medicare Other | Admitting: Internal Medicine

## 2019-10-11 ENCOUNTER — Telehealth: Payer: Self-pay | Admitting: *Deleted

## 2019-10-11 ENCOUNTER — Ambulatory Visit (INDEPENDENT_AMBULATORY_CARE_PROVIDER_SITE_OTHER): Payer: Medicare Other | Admitting: General Practice

## 2019-10-11 ENCOUNTER — Other Ambulatory Visit: Payer: Self-pay

## 2019-10-11 ENCOUNTER — Encounter: Payer: Self-pay | Admitting: Radiation Oncology

## 2019-10-11 ENCOUNTER — Ambulatory Visit
Admission: RE | Admit: 2019-10-11 | Discharge: 2019-10-11 | Disposition: A | Discharge status: Home / Self Care | Payer: Medicare Other | Source: Ambulatory Visit | Attending: Radiation Oncology | Admitting: Radiation Oncology

## 2019-10-11 DIAGNOSIS — R300 Dysuria: Secondary | ICD-10-CM | POA: Diagnosis not present

## 2019-10-11 DIAGNOSIS — Z7901 Long term (current) use of anticoagulants: Secondary | ICD-10-CM

## 2019-10-11 DIAGNOSIS — Z86718 Personal history of other venous thrombosis and embolism: Secondary | ICD-10-CM | POA: Diagnosis not present

## 2019-10-11 DIAGNOSIS — C541 Malignant neoplasm of endometrium: Secondary | ICD-10-CM | POA: Diagnosis not present

## 2019-10-11 LAB — POCT INR: INR: 6.5 — AB (ref 2.0–3.0)

## 2019-10-11 NOTE — Telephone Encounter (Signed)
CALLED PATIENT TO INFORM OF NEW HDR [facility], LVM FOR A RETURN CALL 

## 2019-10-11 NOTE — Progress Notes (Signed)
Agree with management.  Doreen Garretson J Caliann Leckrone, MD  

## 2019-10-11 NOTE — Patient Instructions (Signed)
Pre visit review using our clinic review tool, if applicable. No additional management support is needed unless otherwise documented below in the visit note.  Hold coumadin through Saturday.  Take 1/2 tablet on Sunday and Monday and re-check on Tuesday (1 week).  Patient finished with radiation therapy today!

## 2019-10-12 ENCOUNTER — Telehealth: Payer: Self-pay

## 2019-10-12 LAB — URINE CULTURE

## 2019-10-12 NOTE — Telephone Encounter (Signed)
Patient called back and advised no infection seen in her urine sample from 10/10/2019. Advised pt. If has sx that continue then Dr. Sondra Come wants to repeat the urine test.

## 2019-10-12 NOTE — Telephone Encounter (Signed)
Patient calling to obtain the results of her urine specimen from 10/10/2019. Call back [phone].

## 2019-10-12 NOTE — Progress Notes (Signed)
  Patient Name: Bonnie Zhang MRN: [id number] DOB: 1940/10/09 Referring Physician: Joylene John (Profile Not Attached) Date of Service: 10/11/2019 [facility], [place]                                                        End Of Treatment Note  Diagnoses: C54.1-Malignant neoplasm of endometrium  Cancer Staging: Endometrioid adenocarcinoma at the vaginal cuff and anterior vagina after prior hysterectomy for complex atypical hyperplasia, grade 2  Intent: Curative  Radiation Treatment Dates: 09/06/2019 through 10/11/2019 Site Technique Total Dose (Gy) Dose per Fx (Gy) Completed Fx Beam Energies  Vagina: Pelvis IMRT 45/45 1.8 25/25 6X   Narrative: The patient tolerated radiation therapy relatively well. She reported mild fatigue, occasional nausea, some vaginal itching/burning/dryness, some urinary frequency, mild dysuria, and intermittent diarrhea that was managed with Imodium. UA culture and sensitivity were sent to the lab for urinary symptoms and are suspicious for infection. Cultures are still pending. Samul Dada was recommended by Gyn/onc for vaginal itching and dryness, however it did not improve her symptoms. She was also given the equipment to proceed with sitz baths twice a day. Towards the end of treatment, she also noted some blood when she wiped. Limited pelvic exam on 10/10/2019 showed significant radiation reaction along the labia without any bleeding. There were no signs of secondary infection. She denied vaginal bleeding/discharge, rectal bleeding, vomiting, and hematuria.  Plan: The patient will begin vaginal brachytherapy treatments on 10/25/2019.  ________________________________________________   Blair Promise, PhD, MD  This document serves as a record of services personally performed by Gery Pray, MD.  It was created on his behalf by Clerance Lav, a trained medical scribe. The creation of this record is based on the scribe's personal observations and the provider's statements to them. This document has been checked and approved by the attending provider.

## 2019-10-14 ENCOUNTER — Other Ambulatory Visit: Payer: Self-pay

## 2019-10-14 ENCOUNTER — Telehealth: Payer: Self-pay

## 2019-10-14 NOTE — Telephone Encounter (Signed)
Patient called and advised Dr. Isidore Moos is having a prescription called to her pharmacy for 4 % Lidocaine gel to use on the exterior of her vagina as needed. Patient to continue with sitz baths and to use a fan to help dry the area and decrease the irritation. Patient verbalized understanding.  Pharmacy called and reports they are out of this but it can be obtained over the counter at most pharmacies. Patient called and advised of this new information. She will have her daughter pick some up. Patient advised to call on 10/17/2019 in the morning to let Dr. Sondra Come know how she is doing and this RN will f/u if she does not call.

## 2019-10-17 ENCOUNTER — Telehealth: Payer: Self-pay

## 2019-10-17 NOTE — Telephone Encounter (Signed)
Patient called to update Dr. Sondra Come on her sx. She states her vaginal itching is better. The Lidocaine burns at first and then after 3-4 min she feels better. She is still  using  Epsom salts and Dove soap. She reports that she is still very uncomfortable and wants to know if she should still have her HDR/[facility] appointment on Thursday.

## 2019-10-18 ENCOUNTER — Ambulatory Visit (INDEPENDENT_AMBULATORY_CARE_PROVIDER_SITE_OTHER): Payer: Medicare Other | Admitting: General Practice

## 2019-10-18 ENCOUNTER — Other Ambulatory Visit: Payer: Self-pay

## 2019-10-18 DIAGNOSIS — Z86718 Personal history of other venous thrombosis and embolism: Secondary | ICD-10-CM | POA: Diagnosis not present

## 2019-10-18 DIAGNOSIS — Z7901 Long term (current) use of anticoagulants: Secondary | ICD-10-CM

## 2019-10-18 LAB — POCT INR: INR: 1.4 — AB (ref 2.0–3.0)

## 2019-10-18 NOTE — Progress Notes (Signed)
Medical screening examination/treatment/procedure(s) were performed by non-physician practitioner and as supervising physician I was immediately available for consultation/collaboration. I agree with above. Tonye Tancredi, MD   

## 2019-10-18 NOTE — Patient Instructions (Addendum)
Pre visit review using our clinic review tool, if applicable. No additional management support is needed unless otherwise documented below in the visit note.  Take 1 tablet today and tomorrow and then start taking 1/2 tablet daily.  Re-check in 10 days.

## 2019-10-20 ENCOUNTER — Ambulatory Visit: Payer: Medicare Other | Admitting: Radiation Oncology

## 2019-10-20 ENCOUNTER — Ambulatory Visit: Payer: Medicare Other

## 2019-10-20 ENCOUNTER — Telehealth: Payer: Self-pay | Admitting: *Deleted

## 2019-10-20 ENCOUNTER — Ambulatory Visit: Admission: RE | Admit: 2019-10-20 | Payer: Medicare Other | Source: Ambulatory Visit | Admitting: Radiation Oncology

## 2019-10-20 NOTE — Telephone Encounter (Signed)
CALLED PATIENT TO INFORM OF NEW HDR [facility] FOR 10-25-19, LVM FOR A RETURN CALL

## 2019-10-23 NOTE — Progress Notes (Signed)
  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 She will be treated with a 2.0 cm diameter segmented cylinder. This conforms to her anatomy without undue discomfort.  Vaginal brachytherapy procedure node: The patient was brought to the [place] suite. Identity was confirmed. All relevant records and images related to the planned course of therapy were reviewed. The patient freely provided informed written consent to proceed with treatment after reviewing the details related to the planned course of therapy. The consent form was witnessed and verified by the simulation staff. Then, the patient was set-up in a stable reproducible supine position for radiation therapy. Pelvic exam revealed the vaginal cuff to be intact . The patient's custom vaginal cylinder was placed in the proximal vagina. This was affixed to the CT/MR stabilization plate to prevent slippage. Patient tolerated the placement well.  Verification simulation note:  A fiducial marker was placed within the vaginal cylinder. An AP and lateral film was then obtained through the pelvis area. This documented accurate position of the vaginal cylinder for treatment.  HDR BRACHYTHERAPY TREATMENT  The remote afterloading device was affixed to the vaginal cylinder by catheter. Patient then proceeded to undergo her first high-dose-rate treatment directed at the  vagina. The patient was prescribed a dose of 6.0 gray to be delivered to the mucosal surface. Treatment length was 4.0 cm. Patient was treated with 1 channel using 9 dwell positions.  Treatment time was 138.9 seconds. Iridium 192 was the high-dose-rate source for treatment. The patient tolerated the treatment well. After completion of her therapy, a radiation survey was performed documenting return of the iridium source into the [employer] safe.   PLAN: The patient will return on 11/07/2019 for her second high-dose-rate treatment. ________________________________    Blair Promise, PhD, MD  This document serves as a record of services personally performed by Gery Pray, MD. It was created on his behalf by Clerance Lav, a trained medical scribe. The creation of this record is based on the scribe's personal observations and the provider's statements to them. This document has been checked and approved by the attending provider.

## 2019-10-23 NOTE — Progress Notes (Signed)
Radiation Oncology         (336) [phone] ________________________________  Name: Bonnie Zhang MRN: [id number]  Date: 10/25/2019  DOB: 1940-11-28  Vaginal Brachytherapy Procedure Note  CC: [name], MD Dorothyann Gibbs, NP    ICD-10-CM   1. 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 She underwent radiation therapy directed at the pelvis from 09/06/2019 - 10/11/2019 (45 Gy in 25 fractions; 6X) During treatment, she did experience some significant radiation reaction along the labia without any bleeding. There were no signs of secondary infection. Since the end of treatment, she was prescribed 4% Lidocaine by Dr. Isidore Moos for external vagina as needed. She was also advised to continue sitz baths and use a fan to help dry the area and decrease irritation. She called on 10/17/2019 and reported improvement in symptoms.   On review of systems, she reports no complaints. She denies vaginal bleeding.  ALLERGIES: is allergic to vicodin [hydrocodone-acetaminophen], aspirin, atorvastatin, and hctz [hydrochlorothiazide].  Meds: Current Outpatient Medications  Medication Sig Dispense Refill  . acetaminophen (TYLENOL) 500 MG tablet Take 500 mg by mouth every 6 (six) hours as needed.    Marland Kitchen amLODipine (NORVASC) 5 MG tablet Take 1 tablet (5 mg total) by mouth daily. Take an extra 5 mg if BP is > 145/90 100 tablet 1  . benazepril (LOTENSIN) 40 MG tablet Take 1 tablet (40 mg total) by mouth daily. 90 tablet 1  . metoprolol succinate (TOPROL-XL) 50 MG 24 hr tablet Take 1 tablet (50 mg total) by mouth daily. Take with or immediately following a meal. 90 tablet 1  . nystatin (MYCOSTATIN/NYSTOP) powder Apply 1 application  topically 3 (three) times daily. 15 g 1  . pantoprazole (PROTONIX) 40 MG tablet TAKE 1 TABLET BY MOUTH ONCE DAILY AS NEEDED FOR  ACID  REFLUX 90 tablet 0  . simvastatin (ZOCOR) 10 MG tablet TAKE 1 TABLET BY MOUTH AT BEDTIME. 90 tablet 1  . spironolactone (ALDACTONE) 25 MG tablet Take 0.5 tablets (12.5 mg total) by mouth daily. 45 tablet 1  . vitamin B-12 (CYANOCOBALAMIN) 1000 MCG tablet Take 1,000 mcg by mouth daily.    Marland Kitchen warfarin (COUMADIN) 5 MG tablet Take 1/2 tablet daily except take 1 tablet on Mon and Thurs or take as directed by anticoagulation clinic 90 tablet 1   No current facility-administered medications for this encounter.    Physical Findings: The patient is in no acute distress. Patient is alert and oriented.  height is 5\' 1"  (1.549 m) and weight is 167 lb (75.8 kg). Her temperature is 98.3 F (36.8 C). Her blood pressure is 148/56 (abnormal) and her pulse is 71. Her respiration is 20 and oxygen saturation is 97%.   No palpable cervical, supraclavicular or axillary lymphoadenopathy. The heart has a regular rate and rhythm. The lungs are clear to auscultation. Abdomen soft and non-tender.  On pelvic examination the external genitalia were unremarkable. A speculum exam was performed.  Vaginal mass significantly improved. Hard to see any residual cuff lesion.  Lab Findings: Lab Results  Component Value Date   WBC 6.7 03/18/2019   HGB 14.9 03/18/2019   HCT 45.3 03/18/2019   MCV 90.8 03/18/2019   PLT 188 03/18/2019    Radiographic Findings: No results found.  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 The patient will be treated with a 2.0 cm diameter cylinder with a treatment length of 4.0 cm. This distended the vaginal vault without undue discomfort. The patient tolerated the procedure well.  The patient was successfully  fitted for a vaginal cylinder. The patient is appropriate to begin vaginal brachytherapy.   Plan: The patient will proceed with CT simulation and vaginal brachytherapy today.    _______________________________   Blair Promise, PhD, MD  This document serves as a record of services personally performed by Gery Pray, MD. It was created on his behalf by Clerance Lav, a trained medical scribe. The creation of this record is based on the scribe's personal observations and the provider's statements to them. This document has been checked and approved by the attending provider.

## 2019-10-23 NOTE — Progress Notes (Signed)
  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  Identity was confirmed.  All relevant records and images related to the planned course of therapy were reviewed.  The patient freely provided informed written consent to proceed with treatment after reviewing the details related to the planned course of therapy. The consent form was witnessed and verified by the simulation staff.  Then, the patient was set-up in a stable reproducible  supine position for radiation therapy.  CT images were obtained.  Surface markings were placed.  The CT images were loaded into the planning software.  Then the target and avoidance structures were contoured.  Treatment planning then occurred.  The radiation prescription was entered and confirmed.   I have requested : Brachytherapy Isodose Plan and Dosimetry Calculations to plan the radiation distribution.    PLAN:  The patient will receive 24 Gy in 4 fractions.   ________________________________   Blair Promise, PhD, MD  This document serves as a record of services personally performed by Gery Pray, MD. It was created on his behalf by Clerance Lav, a trained medical scribe. The creation of this record is based on the scribe's personal observations and the provider's statements to them. This document has been checked and approved by the attending provider.

## 2019-10-24 ENCOUNTER — Telehealth: Payer: Self-pay | Admitting: *Deleted

## 2019-10-24 NOTE — Telephone Encounter (Signed)
CALLED PATIENT TO REMIND OF NEW HDR [facility], SPOKE WITH PATIENT AND SHE IS AWARE OF THESE APPTS 

## 2019-10-25 ENCOUNTER — Ambulatory Visit
Admission: RE | Admit: 2019-10-25 | Discharge: 2019-10-25 | Disposition: A | Discharge status: Home / Self Care | Payer: Medicare Other | Source: Ambulatory Visit | Attending: Radiation Oncology | Admitting: Radiation Oncology

## 2019-10-25 ENCOUNTER — Encounter: Payer: Self-pay | Admitting: Radiation Oncology

## 2019-10-25 ENCOUNTER — Other Ambulatory Visit: Payer: Self-pay

## 2019-10-25 VITALS — BP 148/56 | HR 71 | Temp 98.3°F | Resp 20 | Ht 61.0 in | Wt 167.0 lb

## 2019-10-25 DIAGNOSIS — C541 Malignant neoplasm of endometrium: Secondary | ICD-10-CM

## 2019-10-25 DIAGNOSIS — R300 Dysuria: Secondary | ICD-10-CM | POA: Diagnosis not present

## 2019-10-25 NOTE — Progress Notes (Signed)
Patient here for HDR/VCC with Dr. Sondra Come. She denies any questions at present.  BP (!) 148/56 (BP Location: Right Arm, Patient Position: Sitting, Cuff Size: Normal)   Pulse 71   Temp 98.3 F (36.8 C)   Resp 20   Ht 5\' 1"  (1.549 m)   Wt 167 lb (75.8 kg)   SpO2 97%   BMI 31.55 kg/m    Wt Readings from Last 3 Encounters:  10/25/19 167 lb (75.8 kg)  09/01/19 174 lb (78.9 kg)  08/24/19 175 lb (79.4 kg)

## 2019-10-27 ENCOUNTER — Ambulatory Visit (INDEPENDENT_AMBULATORY_CARE_PROVIDER_SITE_OTHER): Payer: Medicare Other | Admitting: General Practice

## 2019-10-27 ENCOUNTER — Ambulatory Visit: Payer: Medicare Other | Admitting: Radiation Oncology

## 2019-10-27 ENCOUNTER — Ambulatory Visit: Payer: Medicare Other

## 2019-10-27 ENCOUNTER — Other Ambulatory Visit: Payer: Self-pay

## 2019-10-27 DIAGNOSIS — Z7901 Long term (current) use of anticoagulants: Secondary | ICD-10-CM | POA: Diagnosis not present

## 2019-10-27 DIAGNOSIS — Z86718 Personal history of other venous thrombosis and embolism: Secondary | ICD-10-CM

## 2019-10-27 LAB — POCT INR: INR: 1.9 — AB (ref 2.0–3.0)

## 2019-10-27 NOTE — Patient Instructions (Signed)
Pre visit review using our clinic review tool, if applicable. No additional management support is needed unless otherwise documented below in the visit note.  Take an additional 1/2 tablet today for a total of 1 tablet and then change dosage and take 1/2 tablet daily except 1 tablet on Thursdays only.  Re-check in 2 to 3 weeks.

## 2019-10-27 NOTE — Progress Notes (Signed)
Agree with management.  Anddy Wingert J Jock Mahon, MD  

## 2019-10-31 NOTE — Progress Notes (Signed)
  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 She will be treated with a 2.0 cm diameter segmented cylinder. This conforms to her anatomy without undue discomfort.  Vaginal brachytherapy procedure node: The patient was brought to the [facility] suite. Identity was confirmed. All relevant records and images related to the planned course of therapy were reviewed. The patient freely provided informed written consent to proceed with treatment after reviewing the details related to the planned course of therapy. The consent form was witnessed and verified by the simulation staff. Then, the patient was set-up in a stable reproducible supine position for radiation therapy. Pelvic exam revealed the vaginal cuff to be intact . The patient's custom vaginal cylinder was placed in the proximal vagina. This was affixed to the CT/MR stabilization plate to prevent slippage. Patient tolerated the placement well.  Verification simulation note:  A fiducial marker was placed within the vaginal cylinder. An AP and lateral film was then obtained through the pelvis area. This documented accurate position of the vaginal cylinder for treatment.  HDR BRACHYTHERAPY TREATMENT  The remote afterloading device was affixed to the vaginal cylinder by catheter. Patient then proceeded to undergo her second high-dose-rate treatment directed at the  vagina. The patient was prescribed a dose of 6.0 gray to be delivered to the mucosal surface. Treatment length was 4.0 cm. Patient was treated with 1 channel using 9 dwell positions.  Treatment time was 156.8 seconds. Iridium 192 was the high-dose-rate source for treatment. The patient tolerated the treatment well. After completion of her therapy, a radiation survey was performed documenting return of the iridium source into the GammaMed safe.   PLAN: The patient will return on 11/16/2019 for her third high-dose-rate treatment. ________________________________    Blair Promise, PhD, MD  This document serves as a record of services personally performed by Gery Pray, MD. It was created on his behalf by Clerance Lav, a trained medical scribe. The creation of this record is based on the scribe's personal observations and the provider's statements to them. This document has been checked and approved by the attending provider.

## 2019-11-01 ENCOUNTER — Telehealth: Payer: Medicare Other

## 2019-11-03 ENCOUNTER — Telehealth: Payer: Self-pay | Admitting: *Deleted

## 2019-11-03 NOTE — Telephone Encounter (Signed)
CALLED PATIENT TO REMIND OF HDR [place] 11-07-19 @ 8 AM, SPOKE WITH PATIENT AND SHE IS AWARE OF THIS [employer].

## 2019-11-07 ENCOUNTER — Ambulatory Visit
Admission: RE | Admit: 2019-11-07 | Discharge: 2019-11-07 | Disposition: A | Discharge status: Home / Self Care | Payer: Medicare Other | Source: Ambulatory Visit | Attending: Radiation Oncology | Admitting: Radiation Oncology

## 2019-11-07 ENCOUNTER — Other Ambulatory Visit: Payer: Self-pay

## 2019-11-07 DIAGNOSIS — C541 Malignant neoplasm of endometrium: Secondary | ICD-10-CM | POA: Insufficient documentation

## 2019-11-07 DIAGNOSIS — R300 Dysuria: Secondary | ICD-10-CM | POA: Diagnosis not present

## 2019-11-14 NOTE — Progress Notes (Signed)
  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 She will be treated with a 2.0 cm diameter segmented cylinder. This conforms to her anatomy without undue discomfort.  Vaginal brachytherapy procedure node: The patient was brought to the [place] suite. Identity was confirmed. All relevant records and images related to the planned course of therapy were reviewed. The patient freely provided informed written consent to proceed with treatment after reviewing the details related to the planned course of therapy. The consent form was witnessed and verified by the simulation staff. Then, the patient was set-up in a stable reproducible supine position for radiation therapy. Pelvic exam revealed the vaginal cuff to be intact . The patient's custom vaginal cylinder was placed in the proximal vagina. This was affixed to the CT/MR stabilization plate to prevent slippage. Patient tolerated the placement well.  Verification simulation note:  A fiducial marker was placed within the vaginal cylinder. An AP and lateral film was then obtained through the pelvis area. This documented accurate position of the vaginal cylinder for treatment.  HDR BRACHYTHERAPY TREATMENT  The remote afterloading device was affixed to the vaginal cylinder by catheter. Patient then proceeded to undergo her third high-dose-rate treatment directed at the  vagina. The patient was prescribed a dose of 6.0 gray to be delivered to the mucosal surface. Treatment length was 4.0 cm. Patient was treated with 1 channel using 9 dwell positions.  Treatment time was 170.8 seconds. Iridium 192 was the high-dose-rate source for treatment. The patient tolerated the treatment well. After completion of her therapy, a radiation survey was performed documenting return of the iridium source into the GammaMed safe.   PLAN: The patient will return in one week for her fourth and final high-dose-rate treatment. ________________________________    Blair Promise, PhD, MD  This document serves as a record of services personally performed by Gery Pray, MD. It was created on his behalf by Clerance Lav, a trained medical scribe. The creation of this record is based on the scribe's personal observations and the provider's statements to them. This document has been checked and approved by the attending provider.

## 2019-11-15 ENCOUNTER — Telehealth: Payer: Self-pay | Admitting: *Deleted

## 2019-11-15 NOTE — Telephone Encounter (Signed)
CALLED PATIENT TO REMIND OF HDR [place] 11-16-19 @ 11 AM, SPOKE WITH PATIENT AND SHE IS AWARE OF THIS APPT.

## 2019-11-16 ENCOUNTER — Other Ambulatory Visit: Payer: Self-pay

## 2019-11-16 ENCOUNTER — Ambulatory Visit
Admission: RE | Admit: 2019-11-16 | Discharge: 2019-11-16 | Disposition: A | Discharge status: Home / Self Care | Payer: Medicare Other | Source: Ambulatory Visit | Attending: Radiation Oncology | Admitting: Radiation Oncology

## 2019-11-16 DIAGNOSIS — R300 Dysuria: Secondary | ICD-10-CM | POA: Diagnosis not present

## 2019-11-16 DIAGNOSIS — C541 Malignant neoplasm of endometrium: Secondary | ICD-10-CM | POA: Diagnosis not present

## 2019-11-17 ENCOUNTER — Ambulatory Visit: Payer: Medicare Other | Admitting: General Practice

## 2019-11-17 DIAGNOSIS — Z7901 Long term (current) use of anticoagulants: Secondary | ICD-10-CM

## 2019-11-17 DIAGNOSIS — Z86718 Personal history of other venous thrombosis and embolism: Secondary | ICD-10-CM

## 2019-11-17 LAB — POCT INR: INR: 2 (ref 2.0–3.0)

## 2019-11-17 NOTE — Patient Instructions (Addendum)
Pre visit review using our clinic review tool, if applicable. No additional management support is needed unless otherwise documented below in the visit note.  Continue to take 1/2 tablet daily except 1 tablet on Thursdays only.  Re-check in 3 weeks.

## 2019-11-21 NOTE — Progress Notes (Signed)
  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 She will be treated with a 2.0 cm diameter segmented cylinder. This conforms to her anatomy without undue discomfort.  Vaginal brachytherapy procedure node: The patient was brought to the [facility] suite. Identity was confirmed. All relevant records and images related to the planned course of therapy were reviewed. The patient freely provided informed written consent to proceed with treatment after reviewing the details related to the planned course of therapy. The consent form was witnessed and verified by the simulation staff. Then, the patient was set-up in a stable reproducible supine position for radiation therapy. Pelvic exam revealed the vaginal cuff to be intact . The patient's custom vaginal cylinder was placed in the proximal vagina. This was affixed to the CT/MR stabilization plate to prevent slippage. Patient tolerated the placement well.  Verification simulation note:  A fiducial marker was placed within the vaginal cylinder. An AP and lateral film was then obtained through the pelvis area. This documented accurate position of the vaginal cylinder for treatment. 182.3 HDR BRACHYTHERAPY TREATMENT  The remote afterloading device was affixed to the vaginal cylinder by catheter. Patient then proceeded to undergo her fourth high-dose-rate treatment directed at the  vagina. The patient was prescribed a dose of 6.0 gray to be delivered to the mucosal surface. Treatment length was 4.0 cm. Patient was treated with 1 channel using 9 dwell positions.  Treatment time was 182.3 seconds. Iridium 192 was the high-dose-rate source for treatment. The patient tolerated the treatment well. After completion of her therapy, a radiation survey was performed documenting return of the iridium source into the GammaMed safe.   PLAN: The patient will return for routine follow-up in one month. ________________________________    Blair Promise, PhD, MD  This document serves as a record of services personally performed by Gery Pray, MD. It was created on his behalf by Clerance Lav, a trained medical scribe. The creation of this record is based on the scribe's personal observations and the provider's statements to them. This document has been checked and approved by the attending provider.

## 2019-11-22 ENCOUNTER — Telehealth: Payer: Self-pay | Admitting: *Deleted

## 2019-11-22 NOTE — Telephone Encounter (Signed)
Called patient to remind of HDR Tx. for 11-23-19 @ 11 am, spoke with patient and she is aware of this tx.

## 2019-11-23 ENCOUNTER — Other Ambulatory Visit: Payer: Self-pay

## 2019-11-23 ENCOUNTER — Encounter: Payer: Self-pay | Admitting: Radiation Oncology

## 2019-11-23 ENCOUNTER — Ambulatory Visit
Admission: RE | Admit: 2019-11-23 | Discharge: 2019-11-23 | Disposition: A | Discharge status: Home / Self Care | Payer: Medicare Other | Source: Ambulatory Visit | Attending: Radiation Oncology | Admitting: Radiation Oncology

## 2019-11-23 DIAGNOSIS — R300 Dysuria: Secondary | ICD-10-CM | POA: Diagnosis not present

## 2019-11-23 DIAGNOSIS — C541 Malignant neoplasm of endometrium: Secondary | ICD-10-CM

## 2019-12-08 ENCOUNTER — Ambulatory Visit: Payer: Medicare Other | Admitting: General Practice

## 2019-12-13 ENCOUNTER — Other Ambulatory Visit: Payer: Self-pay

## 2019-12-13 ENCOUNTER — Ambulatory Visit (INDEPENDENT_AMBULATORY_CARE_PROVIDER_SITE_OTHER): Payer: Medicare Other | Admitting: General Practice

## 2019-12-13 DIAGNOSIS — Z86718 Personal history of other venous thrombosis and embolism: Secondary | ICD-10-CM | POA: Diagnosis not present

## 2019-12-13 DIAGNOSIS — Z7901 Long term (current) use of anticoagulants: Secondary | ICD-10-CM | POA: Diagnosis not present

## 2019-12-13 LAB — POCT INR: INR: 2 (ref 2.0–3.0)

## 2019-12-13 NOTE — Progress Notes (Signed)
Agree with management.  Bonnie Fedor [name], MD  

## 2019-12-13 NOTE — Patient Instructions (Addendum)
Pre visit review using our clinic review tool, if applicable. No additional management support is needed unless otherwise documented below in the visit note.  Change dosage and take 1/2 tablet daily except 1 tablet on Mondays and Thursdays.  Re-check in 3 to 4 weeks.

## 2019-12-14 NOTE — Progress Notes (Incomplete)
  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 She completed IMRT from 09/16/2019 - 10/11/2019. During that time, she reported mild fatigue, occasional nausea, some vaginal itching/burning/dryness, some urinary frequency, mild dysuria, and intermittent diarrhea that was managed with Imodium. Samul Dada was recommended by Gyn/onc for vaginal itching and dryness, however it did not improve her symptoms. She was also given the equipment to proceed with sitz baths twice a day. Towards the end of treatment, she also noted some blood when she wiped. Limited pelvic exam on 10/10/2019 showed significant radiation reaction along the labia without any bleeding. There were no signs of secondary infection. She denied vaginal bleeding/discharge, rectal bleeding, vomiting, and hematuria.  She completed vaginal brachytherapy from 10/25/2019 - 11/23/2019. She reported no complaints during that time.  Plan: The patient will follow-up with radiation oncology in one month.  ________________________________________________   Blair Promise, PhD,  MD  This document serves as a record of services personally performed by Gery Pray, MD. It was created on his behalf by Clerance Lav, a trained medical scribe. The creation of this record is based on the scribe's personal observations and the provider's statements to them. This document has been checked and approved by the attending provider.

## 2019-12-22 ENCOUNTER — Other Ambulatory Visit: Payer: Self-pay

## 2019-12-22 ENCOUNTER — Ambulatory Visit: Payer: Medicare Other | Admitting: Podiatry

## 2019-12-22 DIAGNOSIS — M79675 Pain in left toe(s): Secondary | ICD-10-CM | POA: Diagnosis not present

## 2019-12-22 DIAGNOSIS — L603 Nail dystrophy: Secondary | ICD-10-CM | POA: Diagnosis not present

## 2019-12-22 DIAGNOSIS — Z7901 Long term (current) use of anticoagulants: Secondary | ICD-10-CM | POA: Diagnosis not present

## 2019-12-22 DIAGNOSIS — L600 Ingrowing nail: Secondary | ICD-10-CM | POA: Diagnosis not present

## 2019-12-22 NOTE — Patient Instructions (Signed)

## 2019-12-26 ENCOUNTER — Encounter: Payer: Self-pay | Admitting: Radiation Oncology

## 2019-12-26 ENCOUNTER — Other Ambulatory Visit: Payer: Self-pay

## 2019-12-26 ENCOUNTER — Ambulatory Visit
Admission: RE | Admit: 2019-12-26 | Discharge: 2019-12-26 | Disposition: A | Discharge status: Home / Self Care | Payer: Medicare Other | Source: Ambulatory Visit | Attending: Radiation Oncology | Admitting: Radiation Oncology

## 2019-12-26 DIAGNOSIS — Z90710 Acquired absence of both cervix and uterus: Secondary | ICD-10-CM | POA: Diagnosis not present

## 2019-12-26 DIAGNOSIS — Z79899 Other long term (current) drug therapy: Secondary | ICD-10-CM | POA: Diagnosis not present

## 2019-12-26 DIAGNOSIS — Z7901 Long term (current) use of anticoagulants: Secondary | ICD-10-CM | POA: Insufficient documentation

## 2019-12-26 DIAGNOSIS — C541 Malignant neoplasm of endometrium: Secondary | ICD-10-CM | POA: Diagnosis present

## 2019-12-26 DIAGNOSIS — Z923 Personal history of irradiation: Secondary | ICD-10-CM | POA: Diagnosis not present

## 2019-12-26 NOTE — Progress Notes (Addendum)
Patient here for a 1 month f/u visit with Dr. Sondra Come. She denies pain and reports no more GI problems.  BP 134/69 (BP Location: Left Arm, Patient Position: Sitting, Cuff Size: Normal)   Pulse 61   Temp 97.9 F (36.6 C)   Resp 18   Ht 5\' 1"  (1.549 m)   Wt 163 lb 9.6 oz (74.2 kg)   SpO2 100%   BMI 30.91 kg/m   Wt Readings from Last 3 Encounters:  12/26/19 163 lb 9.6 oz (74.2 kg)  10/25/19 167 lb (75.8 kg)  09/01/19 174 lb (78.9 kg)   Patient given a extra small and an extra small plus dilator.   Home Care Instructions for the Insertion and Care of Your Vaginal Dilator  Why Do I Need a Vaginal Dilator?  Internal radiation therapy may cause scar tissue to form at the top of your vagina (vaginal cuff).  This may make vaginal examinations difficult in the future. You can prevent scar tissue from forming by using a vaginal dilator (a smooth plastic rod), and/or by having regular sexual intercourse.  If not using the dilator you should be having intercourse two or three times a week.  If you are unable to have intercourse, you should use your vaginal dilator.  You may have some spotting or bleeding from your dilator or intercourse the first few times. You may also have some discomfort. If discomfort occurs with intercourse, you and your partner may need to stop for a while and try again later.  How to Use Your Vaginal Dilator  - Wash the dilator with soap and water before and after each use. - Check the dilator to be sure it is smooth. Do not use the dilator if you find any roughspots. - Coat the dilator with K-Y Jelly, Astroglide, or Replens. Do not use Vaseline, baby oil, or other oil based lubricants. They are not water-soluble and can be irritating to the tissues in the vagina. - Lie on your back with your knees bent and legs apart. - Insert the rounded end of the dilator into your vagina as far as it will go without causing pain or discomfort. - Close your knees and slowly  straighten your legs. - Keep the dilator in your vagina for about 10 to 15 minutes.  Please use 3 times a week, for example: Monday, Wednesday and Friday evenings. [facility] your knees, open your legs, and gently remove the dilator. - Gently cleanse the skin around the vaginal opening. - Wash the dilator after each use. -  It is important that you use the dilator routinely until instructed otherwise by your doctor.  Patient given above instructions and verbalized understanding of them.

## 2019-12-26 NOTE — Progress Notes (Signed)
Subjective:   Patient ID: Bonnie Zhang, female   DOB: 79 y.o.   MRN: [id number]   HPI 79 year old female presents the office today for concerns of a possible ingrown toenail to left big toe.  She states the nails become thickened discolored and not able to trim it herself.  Denies any redness or drainage or any signs of infection.  She has no other concerns today.   Review of Systems  All other systems reviewed and are negative.  Past Medical History:  Diagnosis Date  . Anticoagulated on Coumadin 2001   2 blood clots  . Bruises easily    pt is on Coumadin;last dose of Coumadin 08/05/11 and then Lovenox started 08/07/11  . DVT (deep vein thrombosis) in pregnancy     1965 post C section;2001 with prolonged driving while on  HRT  . DVT (deep venous thrombosis) ([place]) 2001   Was on Prempro.    . 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 GERD (gastroesophageal reflux disease)    Protonix prn  . High cholesterol    takes Simvastating daily  . History of colon polyps 2007   Dr Sharlett Iles  . 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 Hx of colonic polyps    Dr Sharlett Iles  . Hx of migraines    yrs ago   . Hypertension    takes Benazepril,Amlodipine,and Metoprolol daily  . Osteoporosis   . PONV (postoperative nausea and vomiting)   . Primary localized osteoarthritis of left knee   . PVD (peripheral vascular disease) ([place]) 1965, 2001   abnormal venous doppler findings due to recurrent DVT'S left leg  . Right knee DJD    knees  . Shortness of breath dyspnea   . Vitamin B12 deficiency (non anemic) 01/24/2015  . Vitamin D deficiency    takes VIt D daily    Past Surgical History:  Procedure Laterality Date  . ABDOMINAL HYSTERECTOMY    . APPENDECTOMY  1965  . BREAST LUMPECTOMY  1987  . CESAREAN SECTION  1961/1965/1966   X 3  . CHOLECYSTECTOMY  1997  . COLONOSCOPY W/ POLYPECTOMY  2007   Dr  Sharlett Iles; due? 2012  . DILATION AND CURETTAGE OF UTERUS  2012   uterine polyp, Dr Kennon Rounds   . ESOPHAGOGASTRODUODENOSCOPY  1997  . HYSTEROSCOPY WITH D & C N/A 08/05/2012   Procedure: DILATATION AND CURETTAGE /HYSTEROSCOPY;  Surgeon: Donnamae Jude, MD;  Location: [facility];  Service: Gynecology;  Laterality: N/A;  . ROBOTIC ASSISTED TOTAL HYSTERECTOMY WITH BILATERAL SALPINGO OOPHERECTOMY Right 09/14/2012   Procedure: ROBOTIC ASSISTED TOTAL HYSTERECTOMY WITH BILATERAL SALPINGO OOPHORECTOMY ,right pelvic LYMPHNODE dissection;  Surgeon: Imagene Gurney A. Alycia Rossetti, MD;  Location: [facility];  Service: Gynecology;  Laterality: Right;  . TOTAL KNEE ARTHROPLASTY  08/11/2011   Procedure: TOTAL KNEE ARTHROPLASTY;  Surgeon: Lorn Junes, MD;  Location: [facility];  Service: Orthopedics;  Laterality: Right;  DR Nelson THIS CASE  . TOTAL KNEE ARTHROPLASTY Left 09/11/2014  . TOTAL KNEE ARTHROPLASTY Left 09/11/2014   Procedure: TOTAL KNEE ARTHROPLASTY;  Surgeon: Elsie Saas, MD;  Location: [facility];  Service: Orthopedics;  Laterality: Left;  . WISDOM TOOTH EXTRACTION       Current Outpatient Medications:  .  acetaminophen (TYLENOL) 500 MG tablet, Take 500 mg by mouth every 6 (six) hours as needed., Disp: , Rfl:  .  amLODipine (NORVASC) 5 MG tablet, Take 1 tablet (5 mg total) by mouth daily. Take an extra 5 mg if BP  is > 145/90, Disp: 100 tablet, Rfl: 1 .  benazepril (LOTENSIN) 40 MG tablet, Take 1 tablet (40 mg total) by mouth daily., Disp: 90 tablet, Rfl: 1 .  metoprolol succinate (TOPROL-XL) 50 MG 24 hr tablet, Take 1 tablet (50 mg total) by mouth daily. Take with or immediately following a meal., Disp: 90 tablet, Rfl: 1 .  nystatin (MYCOSTATIN/NYSTOP) powder, Apply 1 application topically 3 (three) times daily., Disp: 15 g, Rfl: 1 .  pantoprazole (PROTONIX) 40 MG tablet, TAKE 1 TABLET BY MOUTH ONCE DAILY AS NEEDED FOR  ACID  REFLUX, Disp: 90 tablet, Rfl: 0 .  simvastatin (ZOCOR) 10 MG tablet, TAKE 1 TABLET BY MOUTH AT BEDTIME., Disp: 90 tablet, Rfl: 1 .  spironolactone (ALDACTONE) 25 MG tablet, Take  0.5 tablets (12.5 mg total) by mouth daily., Disp: 45 tablet, Rfl: 1 .  vitamin B-12 (CYANOCOBALAMIN) 1000 MCG tablet, Take 1,000 mcg by mouth daily., Disp: , Rfl:  .  warfarin (COUMADIN) 5 MG tablet, Take 1/2 tablet daily except take 1 tablet on Mon and Thurs or take as directed by anticoagulation clinic, Disp: 90 tablet, Rfl: 1  Allergies  Allergen Reactions  . Vicodin [Hydrocodone-Acetaminophen]     Nightmares  . Aspirin Other (See Comments)    upset stomach  . Atorvastatin Other (See Comments)    leg pain  . Hctz [Hydrochlorothiazide] Other (See Comments)    FATIGUE AND EXTREMELY LOW POTASSIUM         Objective:  Physical Exam  General: AAO x3, NAD  Dermatological: Left hallux toenails hypertrophic, dystrophic with yellow-brown discoloration and mild incurvation present.  No edema, erythema.  Loose to the underlying nail bed distally but from adhered proximally.  No open lesions.  Vascular: Dorsalis Pedis artery and Posterior Tibial artery pedal pulses are 2/4 bilateral with immedate capillary fill time.  There is no pain with calf compression, swelling, warmth, erythema.   Neruologic: Grossly intact via light touch bilateral.   Musculoskeletal: No other areas of discomfort identified today.  Gait: Unassisted, Nonantalgic.       Assessment:   79 year old female with left hallux onychodystrophy, ingrown toenail     Plan:  -Treatment options discussed including all alternatives, risks, and complications -Etiology of symptoms were discussed -Discussed no removal however given that she has minimal pain and no signs of infection she is also on Coumadin I sharply debrided the nails with any complications or bleeding.  However if symptoms continue need to have the nail removed.  Trula Slade DPM

## 2019-12-26 NOTE — Progress Notes (Signed)
Radiation Oncology         [phone]) [phone] ________________________________  Name: Bonnie Zhang MRN: [id number]  Date: 12/26/2019  DOB: [birth year]  Follow-Up Visit Note  CC: [name], MD  [name], MD    ICD-10-CM   1. 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 No significant interval history since the end of treatment.  On review of systems, she reports no complaints. She denies pelvic pain.  She denies any vaginal bleeding or discharge.  She denies any urination difficulties or bowel complaints.                 ALLERGIES:  is allergic to vicodin [hydrocodone-acetaminophen], aspirin, atorvastatin, and hctz [hydrochlorothiazide].  Meds: Current Outpatient Medications  Medication Sig Dispense Refill  . acetaminophen (TYLENOL) 500 MG tablet Take 500 mg by mouth every 6 (six) hours as needed.    Marland Kitchen amLODipine (NORVASC) 5 MG tablet Take 1 tablet (5 mg total) by mouth daily. Take an extra 5 mg if BP is > 145/90 100 tablet 1  . benazepril (LOTENSIN) 40 MG tablet Take 1 tablet (40 mg total) by mouth daily. 90 tablet 1  . metoprolol succinate (TOPROL-XL) 50 MG 24 hr tablet Take 1 tablet (50 mg total) by mouth daily. Take with or immediately following a meal. 90 tablet 1  . nystatin (MYCOSTATIN/NYSTOP) powder Apply 1 application topically 3 (three) times daily. 15 g 1  . pantoprazole (PROTONIX) 40 MG tablet TAKE 1 TABLET BY MOUTH ONCE DAILY AS NEEDED FOR  ACID  REFLUX 90 tablet 0  . simvastatin (ZOCOR)  10 MG tablet TAKE 1 TABLET BY MOUTH AT BEDTIME. 90 tablet 1  . spironolactone (ALDACTONE) 25 MG tablet Take 0.5 tablets (12.5 mg total) by mouth daily. 45 tablet 1  . vitamin B-12 (CYANOCOBALAMIN) 1000 MCG tablet Take 1,000 mcg by mouth daily.    Marland Kitchen warfarin (COUMADIN) 5 MG tablet Take 1/2 tablet daily except take 1 tablet on Mon and Thurs or take as directed by anticoagulation clinic 90 tablet 1   No current facility-administered medications for this encounter.    Physical Findings: The patient is in no acute distress. Patient is alert and oriented.  height is 5\' 1"  (1.549 m) and weight is 163 lb 9.6 oz (74.2 kg). Her temperature is 97.9 F (36.6 C). Her blood pressure is 134/69 and her pulse is 61. Her respiration is 18 and oxygen saturation is 100%.  No significant changes. Lungs are clear to auscultation bilaterally. Heart has regular rate and rhythm. No palpable cervical, supraclavicular, or axillary adenopathy. Abdomen soft, non-tender, normal bowel sounds. Pelvic exam deferred in light of recent treatment completion.   Lab Findings: Lab Results  Component Value Date   WBC 6.7 03/18/2019   HGB 14.9 03/18/2019   HCT 45.3 03/18/2019   MCV 90.8 03/18/2019   PLT 188 03/18/2019    Radiographic Findings: No results found.  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  At the time of her vaginal brachytherapy she was noted to have good response to her external beam radiation therapy.  Plan: The patient is scheduled to follow-up with Dr. Mickeal Skinner on 08/14/2020. She will follow-up with radiation oncology in 5 months.  She will also follow-up with Dr. Gaylord Shih in 2 months.  Today the patient was given a vaginal dilator and instructions on its use in light of her pelvic and vaginal brachytherapy  treatments.    ____________________________________   Blair Promise, PhD, MD  This document serves as a record of services personally performed by Gery Pray, MD. It was created on his behalf by Clerance Lav, a trained medical scribe. The creation of this record is based on the scribe's personal observations and the provider's statements to them. This document has been checked and approved by the attending provider.

## 2019-12-27 ENCOUNTER — Other Ambulatory Visit: Payer: Self-pay | Admitting: Internal Medicine

## 2020-01-10 ENCOUNTER — Other Ambulatory Visit: Payer: Self-pay

## 2020-01-10 ENCOUNTER — Ambulatory Visit (INDEPENDENT_AMBULATORY_CARE_PROVIDER_SITE_OTHER): Payer: Medicare Other | Admitting: General Practice

## 2020-01-10 DIAGNOSIS — Z86718 Personal history of other venous thrombosis and embolism: Secondary | ICD-10-CM | POA: Diagnosis not present

## 2020-01-10 DIAGNOSIS — Z7901 Long term (current) use of anticoagulants: Secondary | ICD-10-CM | POA: Diagnosis not present

## 2020-01-10 LAB — POCT INR: INR: 2.5 (ref 2.0–3.0)

## 2020-01-10 NOTE — Patient Instructions (Addendum)
Pre visit review using our clinic review tool, if applicable. No additional management support is needed unless otherwise documented below in the visit note.  Continue to take 1/2 tablet daily except 1 tablet on Mondays and Thursdays.  Re-check in to 6 weeks.    

## 2020-01-10 NOTE — Progress Notes (Signed)
Agree with management.  Hideko Esselman J Tilak Oakley, MD  

## 2020-01-11 DIAGNOSIS — L570 Actinic keratosis: Secondary | ICD-10-CM | POA: Diagnosis not present

## 2020-01-11 DIAGNOSIS — X32XXXD Exposure to sunlight, subsequent encounter: Secondary | ICD-10-CM | POA: Diagnosis not present

## 2020-01-13 ENCOUNTER — Ambulatory Visit (INDEPENDENT_AMBULATORY_CARE_PROVIDER_SITE_OTHER): Payer: Medicare Other

## 2020-01-13 DIAGNOSIS — Z0000 Encounter for general adult medical examination without abnormal findings: Secondary | ICD-10-CM

## 2020-01-13 NOTE — Patient Instructions (Signed)
Bonnie Zhang , Thank you for taking time to come for your Medicare Wellness Visit. I appreciate your ongoing commitment to your health goals. Please review the following plan we discussed and let me know if I can assist you in the future.   Screening recommendations/referrals: Colonoscopy: no repeat due to age Mammogram: 02/25/2016 Bone Density: 07/11/2016 Recommended yearly ophthalmology/optometry visit for glaucoma screening and checkup Recommended yearly dental visit for hygiene and checkup  Vaccinations: Influenza vaccine: declined Pneumococcal vaccine: declined Tdap vaccine: 03/21/2010 Shingles vaccine: declined   Covid-19: up to date  Advanced directives: Please bring a copy of your health care power of attorney and living will to the office at your convenience.   Conditions/risks identified: Yes; Reviewed health maintenance screenings with patient today and relevant education, vaccines, and/or referrals were provided. Please continue to do your personal lifestyle choices by: daily care of teeth and gums, regular physical activity (goal should be 5 days a week for 30 minutes), eat a healthy diet, avoid tobacco and drug use, limiting any alcohol intake, taking a low-dose aspirin (if not allergic or have been advised by your provider otherwise) and taking vitamins and minerals as recommended by your provider. Continue doing brain stimulating activities (puzzles, reading, adult coloring books, staying active) to keep memory sharp. Continue to eat heart healthy diet (full of fruits, vegetables, whole grains, lean protein, water--limit salt, fat, and sugar intake) and increase physical activity as tolerated.  Next appointment: Please schedule your next Medicare Wellness Visit with your Nurse Health Advisor in 1 year by calling [phone].  Preventive Care 78 Years and Older, Female Preventive care refers to lifestyle choices and visits with your health care provider that can promote health  and wellness. What does preventive care include?  A yearly physical exam. This is also called an annual well check.  Dental exams once or twice a year.  Routine eye exams. Ask your health care provider how often you should have your eyes checked.  Personal lifestyle choices, including:  Daily care of your teeth and gums.  Regular physical activity.  Eating a healthy diet.  Avoiding tobacco and drug use.  Limiting alcohol use.  Practicing safe sex.  Taking low-dose aspirin every day.  Taking vitamin and mineral supplements as recommended by your health care provider. What happens during an annual well check? The services and screenings done by your health care provider during your annual well check will depend on your age, overall health, lifestyle risk factors, and family history of disease. Counseling  Your health care provider may ask you questions about your:  Alcohol use.  Tobacco use.  Drug use.  Emotional well-being.  Home and relationship well-being.  Sexual activity.  Eating habits.  History of falls.  Memory and ability to understand (cognition).  Work and work Statistician.  Reproductive health. Screening  You may have the following tests or measurements:  Height, weight, and BMI.  Blood pressure.  Lipid and cholesterol levels. These may be checked every 5 years, or more frequently if you are over 65 years old.  Skin check.  Lung cancer screening. You may have this screening every year starting at age 94 if you have a 30-pack-year history of smoking and currently smoke or have quit within the past 15 years.  Fecal occult blood test (FOBT) of the stool. You may have this test every year starting at age 37.  Flexible sigmoidoscopy or colonoscopy. You may have a sigmoidoscopy every 5 years or a colonoscopy every 10 years  starting at age 58.  Hepatitis C blood test.  Hepatitis B blood test.  Sexually transmitted disease (STD)  testing.  Diabetes screening. This is done by checking your blood sugar (glucose) after you have not eaten for a while (fasting). You may have this done every 1-3 years.  Bone density scan. This is done to screen for osteoporosis. You may have this done starting at age 86.  Mammogram. This may be done every 1-2 years. Talk to your health care provider about how often you should have regular mammograms. Talk with your health care provider about your test results, treatment options, and if necessary, the need for more tests. Vaccines  Your health care provider may recommend certain vaccines, such as:  Influenza vaccine. This is recommended every year.  Tetanus, diphtheria, and acellular pertussis (Tdap, Td) vaccine. You may need a Td booster every 10 years.  Zoster vaccine. You may need this after age 68.  Pneumococcal 13-valent conjugate (PCV13) vaccine. One dose is recommended after age 4.  Pneumococcal polysaccharide (PPSV23) vaccine. One dose is recommended after age 40. Talk to your health care provider about which screenings and vaccines you need and how often you need them. This information is not intended to replace advice given to you by your health care provider. Make sure you discuss any questions you have with your health care provider. Document Released: 04/13/2015 Document Revised: 12/05/2015 Document Reviewed: 01/16/2015 Elsevier Interactive Patient Education  2017 [place] Prevention in the Home Falls can cause injuries. They can happen to people of all ages. There are many things you can do to make your home safe and to help prevent falls. What can I do on the outside of my home?  Regularly fix the edges of walkways and driveways and fix any cracks.  Remove anything that might make you trip as you walk through a door, such as a raised step or threshold.  Trim any bushes or trees on the path to your home.  Use bright outdoor lighting.  Clear any walking  paths of anything that might make someone trip, such as rocks or tools.  Regularly check to see if handrails are loose or broken. Make sure that both sides of any steps have handrails.  Any raised decks and porches should have guardrails on the edges.  Have any leaves, snow, or ice cleared regularly.  Use sand or salt on walking paths during winter.  Clean up any spills in your garage right away. This includes oil or grease spills. What can I do in the bathroom?  Use night lights.  Install grab bars by the toilet and in the tub and shower. Do not use towel bars as grab bars.  Use non-skid mats or decals in the tub or shower.  If you need to sit down in the shower, use a plastic, non-slip stool.  Keep the floor dry. Clean up any water that spills on the floor as soon as it happens.  Remove soap buildup in the tub or shower regularly.  Attach bath mats securely with double-sided non-slip rug tape.  Do not have throw rugs and other things on the floor that can make you trip. What can I do in the bedroom?  Use night lights.  Make sure that you have a light by your bed that is easy to reach.  Do not use any sheets or blankets that are too big for your bed. They should not hang down onto the floor.  Have a  firm chair that has side arms. You can use this for support while you get dressed.  Do not have throw rugs and other things on the floor that can make you trip. What can I do in the kitchen?  Clean up any spills right away.  Avoid walking on wet floors.  Keep items that you use a lot in easy-to-reach places.  If you need to reach something above you, use a strong step stool that has a grab bar.  Keep electrical cords out of the way.  Do not use floor polish or wax that makes floors slippery. If you must use wax, use non-skid floor wax.  Do not have throw rugs and other things on the floor that can make you trip. What can I do with my stairs?  Do not leave any items  on the stairs.  Make sure that there are handrails on both sides of the stairs and use them. Fix handrails that are broken or loose. Make sure that handrails are as long as the stairways.  Check any carpeting to make sure that it is firmly attached to the stairs. Fix any carpet that is loose or worn.  Avoid having throw rugs at the top or bottom of the stairs. If you do have throw rugs, attach them to the floor with carpet tape.  Make sure that you have a light switch at the top of the stairs and the bottom of the stairs. If you do not have them, ask someone to add them for you. What else can I do to help prevent falls?  Wear shoes that:  Do not have high heels.  Have rubber bottoms.  Are comfortable and fit you well.  Are closed at the toe. Do not wear sandals.  If you use a stepladder:  Make sure that it is fully opened. Do not climb a closed stepladder.  Make sure that both sides of the stepladder are locked into place.  Ask someone to hold it for you, if possible.  Clearly mark and make sure that you can see:  Any grab bars or handrails.  First and last steps.  Where the edge of each step is.  Use tools that help you move around (mobility aids) if they are needed. These include:  Canes.  Walkers.  Scooters.  Crutches.  Turn on the lights when you go into a dark area. Replace any light bulbs as soon as they burn out.  Set up your furniture so you have a clear path. Avoid moving your furniture around.  If any of your floors are uneven, fix them.  If there are any pets around you, be aware of where they are.  Review your medicines with your doctor. Some medicines can make you feel dizzy. This can increase your chance of falling. Ask your doctor what other things that you can do to help prevent falls. This information is not intended to replace advice given to you by your health care provider. Make sure you discuss any questions you have with your health care  provider. Document Released: 01/11/2009 Document Revised: 08/23/2015 Document Reviewed: 04/21/2014 Elsevier Interactive Patient Education  2017 [employer].

## 2020-01-13 NOTE — Progress Notes (Addendum)
I connected with Bonnie Zhang today by telephone and verified that I am speaking with the correct person using two identifiers. Location patient: home Location provider: work Persons participating in the virtual visit: Ilianna Bown and Lisette Abu, LPN.   I discussed the limitations, risks, security and privacy concerns of performing an evaluation and management service by telephone and the availability of in person appointments. I also discussed with the patient that there may be a patient responsible charge related to this service. The patient expressed understanding and verbally consented to this telephonic visit.    Interactive audio and video telecommunications were attempted between this provider and patient, however failed, due to patient having technical difficulties OR patient did not have access to video capability.  We continued and completed visit with audio only.  Some vital signs may be absent or patient reported.   Time Spent with patient on telephone encounter: 20 minutes  Subjective:   Bonnie Zhang is a 79 y.o. female who presents for Medicare Annual (Subsequent) preventive examination.  Review of Systems    No ROS. Medicare Wellness Visit. Cardiac Risk Factors include: advanced age (>33men, >54 women);dyslipidemia;family history of premature cardiovascular disease;hypertension     Objective:    Today's Vitals   01/13/20 0958  PainSc: 0-No pain   There is no height or weight on file to calculate BMI.  Advanced Directives 01/13/2020 12/26/2019 10/25/2019 08/24/2019 08/19/2019 03/18/2019 01/26/2019  Does Patient Have a Medical Advance Directive? Yes Yes Yes Yes Yes No Yes  Type of Advance Directive Living will Living will [place] will [place];Living will - Living will  Does patient want to make changes to medical advance directive? No - Patient declined No - Patient declined No - Patient declined No -  Patient declined - - -  Copy of [place] in Chart? - - - - No - copy requested - -  Pre-existing out of facility DNR order (yellow form or pink MOST form) - - - - - - -    Current Medications (verified) Outpatient Encounter Medications as of 01/13/2020  Medication Sig  . acetaminophen (TYLENOL) 500 MG tablet Take 500 mg by mouth every 6 (six) hours as needed.  Marland Kitchen amLODipine (NORVASC) 5 MG tablet Take 1 tablet (5 mg total) by mouth daily. Take an extra 5 mg if BP is > 145/90  . benazepril (LOTENSIN) 40 MG tablet Take 1 tablet (40 mg total) by mouth daily.  . metoprolol succinate (TOPROL-XL) 50 MG 24 hr tablet Take 1 tablet (50 mg total) by mouth daily. Take with or immediately following a meal.  . pantoprazole (PROTONIX) 40 MG tablet TAKE 1 TABLET BY MOUTH ONCE DAILY AS NEEDED FOR ACID REFLUX  . simvastatin (ZOCOR) 10 MG tablet TAKE 1 TABLET BY MOUTH AT BEDTIME.  Marland Kitchen spironolactone (ALDACTONE) 25 MG tablet Take 0.5 tablets (12.5 mg total) by mouth daily.  . vitamin B-12 (CYANOCOBALAMIN) 1000 MCG tablet Take 1,000 mcg by mouth daily.  Marland Kitchen warfarin (COUMADIN) 5 MG tablet Take 1/2 tablet daily except take 1 tablet on Mon and Thurs or take as directed by anticoagulation clinic  . nystatin (MYCOSTATIN/NYSTOP) powder Apply 1 application topically 3 (three) times daily. (Patient not taking: Reported on 01/13/2020)   No facility-administered encounter medications on file as of 01/13/2020.    Allergies (verified) Vicodin [hydrocodone-acetaminophen], Aspirin, Atorvastatin, and Hctz [hydrochlorothiazide]   History: Past Medical History:  Diagnosis Date  . Anticoagulated on Coumadin 2001  2 blood clots  . Bruises easily    pt is on Coumadin;last dose of Coumadin 08/05/11 and then Lovenox started 08/07/11  . DVT (deep vein thrombosis) in pregnancy     1965 post C section;2001 with prolonged driving while on  HRT  . DVT (deep venous thrombosis) ([place]) 2001   Was on Prempro.    .  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 GERD (gastroesophageal reflux disease)    Protonix prn  . High cholesterol    takes Simvastating daily  . History of colon polyps 2007   Dr Sharlett Iles  . 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 Hx of colonic polyps    Dr Sharlett Iles  . Hx of migraines    yrs ago   . Hypertension    takes Benazepril,Amlodipine,and Metoprolol daily  . Osteoporosis   . PONV (postoperative nausea and vomiting)   . Primary localized osteoarthritis of left knee   . PVD (peripheral vascular disease) ([place]) 1965, 2001   abnormal venous doppler findings due to recurrent DVT'S left leg  . Right knee DJD    knees  . Shortness of breath dyspnea   . Vitamin B12 deficiency (non anemic) 01/24/2015  . Vitamin D deficiency    takes VIt D daily   Past Surgical History:  Procedure Laterality Date  . ABDOMINAL HYSTERECTOMY    . APPENDECTOMY  1965  . BREAST LUMPECTOMY  1987  . CESAREAN SECTION  1961/1965/1966   X 3  . CHOLECYSTECTOMY  1997  . COLONOSCOPY W/ POLYPECTOMY  2007   Dr  Sharlett Iles; due? 2012  . DILATION AND CURETTAGE OF UTERUS  2012   uterine polyp, Dr Kennon Rounds  . ESOPHAGOGASTRODUODENOSCOPY  1997  . HYSTEROSCOPY WITH D & C N/A 08/05/2012   Procedure: DILATATION AND CURETTAGE /HYSTEROSCOPY;  Surgeon: Donnamae Jude, MD;  Location: [facility];  Service: Gynecology;  Laterality: N/A;  . ROBOTIC ASSISTED TOTAL HYSTERECTOMY WITH BILATERAL SALPINGO OOPHERECTOMY Right 09/14/2012   Procedure: ROBOTIC ASSISTED TOTAL HYSTERECTOMY WITH BILATERAL SALPINGO OOPHORECTOMY ,right pelvic LYMPHNODE dissection;  Surgeon: Imagene Gurney A. Alycia Rossetti, MD;  Location: [facility];  Service: Gynecology;  Laterality: Right;  . TOTAL KNEE ARTHROPLASTY  08/11/2011   Procedure: TOTAL KNEE ARTHROPLASTY;  Surgeon: Lorn Junes, MD;  Location: [facility];  Service: Orthopedics;  Laterality: Right;  DR Gunter THIS CASE  . TOTAL KNEE ARTHROPLASTY Left 09/11/2014  . TOTAL KNEE ARTHROPLASTY Left 09/11/2014    Procedure: TOTAL KNEE ARTHROPLASTY;  Surgeon: Elsie Saas, MD;  Location: [facility];  Service: Orthopedics;  Laterality: Left;  . WISDOM TOOTH EXTRACTION     Family History  Problem Relation Age of Onset  . Kidney disease Mother        ? hypertensive  . Hypertension Mother   . Deep vein thrombosis Mother        post ankle fracture  . Leukemia Father   . Cancer Father   . Colon cancer Maternal Grandmother   . Diabetes Other        cousins  . Hypertension Sister   . Anesthesia problems Neg Hx   . Hypotension Neg Hx   . Malignant hyperthermia Neg Hx   . Pseudochol deficiency Neg Hx   . Stroke Neg Hx   . Heart disease Neg Hx   . Breast cancer Neg Hx   . Ovarian cancer Neg Hx   . Uterine cancer Neg Hx    Social History   Socioeconomic History  . Marital status: Divorced  Spouse name: Not on file  . Number of children: 2  . Years of education: Not on file  . Highest education level: High school graduate  Occupational History  . Occupation: Retired  Tobacco Use  . Smoking status: Never Smoker  . Smokeless tobacco: Never Used  Vaping Use  . Vaping Use: Never used  Substance and Sexual Activity  . Alcohol use: No  . Drug use: No  . Sexual activity: Never    Birth control/protection: Post-menopausal    Comment: retired. has 2 daughters  Other Topics Concern  . Not on file  Social History Narrative   Pt lives alone she has 2 daughter - right handed- drinks tea, soda sometimes - No regular exercise   Social Determinants of Health   Financial Resource Strain: Low Risk   . Difficulty of Paying Living Expenses: Not hard at all  Food Insecurity: No Food Insecurity  . Worried About Charity fundraiser in the Last Year: Never true  . Ran Out of Food in the Last Year: Never true  Transportation Needs: No Transportation Needs  . Lack of Transportation (Medical): No  . Lack of Transportation (Non-Medical): No  Physical Activity: Sufficiently Active  . Days of Exercise per  Week: 5 days  . Minutes of Exercise per Session: 30 min  Stress: No Stress Concern Present  . Feeling of Stress : Not at all  Social Connections: Moderately Integrated  . Frequency of Communication with Friends and Family: More than three times a week  . Frequency of Social Gatherings with Friends and Family: More than three times a week  . Attends Religious Services: More than 4 times per year  . Active Member of Clubs or Organizations: Yes  . Attends Archivist Meetings: More than 4 times per year  . Marital Status: Widowed    Tobacco Counseling Counseling given: Not Answered   Clinical Intake:  Pre-visit preparation completed: Yes  Pain : No/denies pain Pain Score: 0-No pain     Nutritional Risks: None Diabetes: No  How often do you need to have someone help you when you read instructions, pamphlets, or other written materials from your doctor or pharmacy?: 1 - Never What is the last grade level you completed in school?: HSG  Diabetic? no  Interpreter Needed?: No  Information entered by :: Lisette Abu, LPN   Activities of Daily Living In your present state of health, do you have any difficulty performing the following activities: 01/13/2020  Hearing? N  Vision? N  Difficulty concentrating or making decisions? N  Walking or climbing stairs? N  Dressing or bathing? N  Doing errands, shopping? N  Preparing Food and eating ? N  Using the Toilet? N  In the past six months, have you accidently leaked urine? N  Do you have problems with loss of bowel control? N  Managing your Medications? N  Managing your Finances? N  Housekeeping or managing your Housekeeping? N  Some recent data might be hidden    Patient Care Team: [name], MD as PCP - General (Internal Medicine) Josue Hector, MD as Consulting Physician (Cardiology) Heath Lark, MD as Consulting Physician (Hematology and Oncology) Elsie Saas, MD as Consulting Physician  (Orthopedic Surgery) Pieter Partridge, DO as Consulting Physician (Neurology) Charlton Haws, [facility] as Pharmacist (Pharmacist)  Indicate any recent Medical Services you may have received from other than [facility] providers in the past year (date may be approximate).     Assessment:  This is a routine wellness examination for [facility].  Hearing/Vision screen No exam data present  Dietary issues and exercise activities discussed: Current Exercise Habits: Home exercise routine, Type of exercise: walking, Time (Minutes): 30, Frequency (Times/Week): 5, Weekly Exercise (Minutes/Week): 150, Intensity: Mild, Exercise limited by: orthopedic condition(s)  Goals    . Be healthy, as active and as independent as possible    . Patient Stated     Stay physically and socially active, enjoy life and family.    Marland Kitchen Pharmacy Care Plan     CARE PLAN ENTRY  Current Barriers:  . Chronic Disease Management support, education, and care coordination needs related to Hypertension, Hyperlipidemia, and Recurrent DVT   Hypertension . Pharmacist Clinical Goal(s): o Over the next 90 days, patient will work with PharmD and providers to maintain BP goal <140/90 . Current regimen:  o Amlodipine 5 mg daily, extra 5 mg if BP > 145/90 o Benazepril 40 mg daily o Metoprolol succinate 50 mg daily o Spironolactone 12.5 mg (1/2 of 25 mg) daily . Interventions: o Discussed BP goal and benefits of medications o Discussed flushing is not a common side effect of her current medications; however amlodipine can contribute . Patient self care activities - Over the next 90 days, patient will: o Check BP 1-2x weekly, document, and provide at future appointments o Ensure daily salt intake < 2300 mg/day  Hyperlipidemia/history of TIA Lipid Panel     Component Value Date/Time   CHOL 153 03/03/2019 1025   TRIG 135.0 03/03/2019 1025   HDL 59.90 03/03/2019 1025   LDLCALC 67 03/03/2019 1025   . Pharmacist Clinical  Goal(s): o Over the next 90 days, patient will work with PharmD and providers to maintain LDL goal < 70 . Current regimen:  o Simvastatin 10 mg daily . Interventions: o Discussed cholesterol goals and how simvastatin works best overnight o Discussed importance of avoiding high-cholesterol foods . Patient self care activities - Over the next 90 days, patient will: o Take simvastatin as directed at bedtime o Limit high-cholesterol foods in diet  Recurrent DVT . Pharmacist Clinical Goal(s) o Over the next 90 days, patient will work with PharmD and providers to optimize anticoagulation therapy . Current regimen:  o Warfarin 5 mg as directed per Anti-Coag clinic . Interventions: o Discussed bleeding risk with warfarin - patient has not had issues since stopping aspirin years ago . Patient self care activities - Over the next 90 days, patient will: o Continue to follow up with Jenny Reichmann in Anti-Coag clinic o Continue consistent Vitamin-K diet  Medication management . Pharmacist Clinical Goal(s): o Over the next 90 days, patient will work with PharmD and providers to achieve optimal medication adherence . Current pharmacy: [facility] . Interventions o Comprehensive medication review performed. o Continue current medication management strategy . Patient self care activities - Over the next 90 days, patient will: o Focus on medication adherence by pill box o Take medications as prescribed o Report any questions or concerns to PharmD and/or provider(s)  Initial goal documentation       Depression Screen PHQ 2/9 Scores 01/13/2020 01/11/2019 08/20/2018 01/07/2018 12/29/2017 12/29/2016 12/22/2016  PHQ - 2 Score 0 0 0 0 0 0 0  PHQ- 9 Score - - - 0 3 - 1    Fall Risk Fall Risk  01/13/2020 01/26/2019 01/11/2019 08/20/2018 01/07/2018  Falls in the past year? 0 0 0 0 Yes  Number falls in past yr: 0 0 0 0 1  Injury  with Fall? 0 0 0 - No  Risk for fall due to : No Fall Risks - - - -  Follow up  Falls evaluation completed - - - Falls prevention discussed    Any stairs in or around the home? No  If so, are there any without handrails? No  Home free of loose throw rugs in walkways, pet beds, electrical cords, etc? Yes  Adequate lighting in your home to reduce risk of falls? Yes   ASSISTIVE DEVICES UTILIZED TO PREVENT FALLS:  Life alert? No  Use of a cane, walker or w/c? No  Grab bars in the bathroom? Yes  Shower chair or bench in shower? Yes  Elevated toilet seat or a handicapped toilet? Yes   TIMED UP AND GO:  Was the test performed? No .  Length of time to ambulate 10 feet: 0 sec.   Gait steady and fast without use of assistive device  Cognitive Function: MMSE - Mini Mental State Exam 01/07/2018 12/22/2016  Orientation to time 5 5  Orientation to Place 5 5  Registration 3 3  Attention/ Calculation 5 5  Recall 3 2  Language- name 2 objects 2 2  Language- repeat 1 1  Language- follow 3 step command 3 3  Language- read & follow direction 1 1  Write a sentence 1 1  Copy design 1 1  Total score 30 29        Immunizations Immunization History  Administered Date(s) Administered  . Janssen (J&J) SARS-COV-2 Vaccination 07/08/2019  . Td 03/21/2010    TDAP status: Up to date Flu Vaccine status: Declined, Education has been provided regarding the importance of this vaccine but patient still declined. Advised may receive this vaccine at local pharmacy or Health Dept. Aware to provide a copy of the vaccination record if obtained from local pharmacy or Health Dept. Verbalized acceptance and understanding. Pneumococcal vaccine status: Declined,  Education has been provided regarding the importance of this vaccine but patient still declined. Advised may receive this vaccine at local pharmacy or Health Dept. Aware to provide a copy of the vaccination record if obtained from local pharmacy or Health Dept. Verbalized acceptance and understanding.  Covid-19 vaccine status:  Completed vaccines  Qualifies for Shingles Vaccine? Yes   Zostavax completed No   Shingrix Completed?: No.    Education has been provided regarding the importance of this vaccine. Patient has been advised to call insurance company to determine out of pocket expense if they have not yet received this vaccine. Advised may also receive vaccine at local pharmacy or Health Dept. Verbalized acceptance and understanding.  Screening Tests Health Maintenance  Topic Date Due  . Hepatitis C Screening  Never done  . PNA vac Low Risk Adult (1 of 2 - PCV13) Never done  . INFLUENZA VACCINE  Never done  . DEXA SCAN  02/20/2020 (Originally 07/12/2018)  . TETANUS/TDAP  03/21/2020  . COVID-19 Vaccine  Completed    Health Maintenance  Health Maintenance Due  Topic Date Due  . Hepatitis C Screening  Never done  . PNA vac Low Risk Adult (1 of 2 - PCV13) Never done  . INFLUENZA VACCINE  Never done    Colorectal cancer screening: No longer required.  Mammogram status: Completed 02/25/2016. Repeat every year Bone Density status: Completed 07/11/2016. Results reflect: Bone density results: OSTEOPOROSIS. Repeat every 2 years.  Lung Cancer Screening: (Low Dose CT Chest recommended if Age 29-80 years, 30 pack-year currently smoking OR have quit w/in 15years.)  does not qualify.   Lung Cancer Screening Referral: no  Additional Screening:  Hepatitis C Screening: does qualify; Completed no  Vision Screening: Recommended annual ophthalmology exams for early detection of glaucoma and other disorders of the eye. Is the patient up to date with their annual eye exam?  Yes  Who is the provider or what is the name of the office in which the patient attends annual eye exams? [facility] If pt is not established with a provider, would they like to be referred to a provider to establish care? No .   Dental Screening: Recommended annual dental exams for proper oral hygiene  Community Resource Referral /  Chronic Care Management: CRR required this visit?  No   CCM required this visit?  No      Plan:     I have personally reviewed and noted the following in the patient's chart:   . Medical and social history . Use of alcohol, tobacco or illicit drugs  . Current medications and supplements . Functional ability and status . Nutritional status . Physical activity . Advanced directives . List of other physicians . Hospitalizations, surgeries, and ER visits in previous 12 months . Vitals . Screenings to include cognitive, depression, and falls . Referrals and appointments  In addition, I have reviewed and discussed with patient certain preventive protocols, quality metrics, and best practice recommendations. A written personalized care plan for preventive services as well as general preventive health recommendations were provided to patient.     Sheral Flow, LPN   [id number]   Nurse Notes:  Patient is cogitatively intact. There were no vitals filed for this visit. There is no height or weight on file to calculate BMI. Patient stated that she has no issues with gait or balance; does not use any assistive devices.

## 2020-02-06 DIAGNOSIS — Z1231 Encounter for screening mammogram for malignant neoplasm of breast: Secondary | ICD-10-CM | POA: Diagnosis not present

## 2020-02-21 ENCOUNTER — Other Ambulatory Visit: Payer: Self-pay

## 2020-02-21 ENCOUNTER — Ambulatory Visit (INDEPENDENT_AMBULATORY_CARE_PROVIDER_SITE_OTHER): Payer: Medicare Other | Admitting: General Practice

## 2020-02-21 DIAGNOSIS — Z7901 Long term (current) use of anticoagulants: Secondary | ICD-10-CM | POA: Diagnosis not present

## 2020-02-21 DIAGNOSIS — Z86718 Personal history of other venous thrombosis and embolism: Secondary | ICD-10-CM

## 2020-02-21 LAB — POCT INR: INR: 3.2 — AB (ref 2.0–3.0)

## 2020-02-21 NOTE — Progress Notes (Signed)
Agree with management.  Kelsey Durflinger J Dalen Hennessee, MD  

## 2020-02-21 NOTE — Patient Instructions (Addendum)
Pre visit review using our clinic review tool, if applicable. No additional management support is needed unless otherwise documented below in the visit note.  Skip dosage tomorrow and then continue to take 1/2 tablet daily except 1 tablet on Mondays and Thursdays.  Re-check in to 6 weeks.      

## 2020-02-24 ENCOUNTER — Telehealth: Payer: Self-pay

## 2020-02-24 NOTE — Progress Notes (Signed)
Gynecologic Oncology Return Clinic Visit  02/27/20  Reason for Visit: Follow-up after completion of radiation therapy  Treatment History: 2012: Endometrial sampling showed benign endometrium, polyps 08/05/2012: D&C and hysteroscopy, pathology revealed at least atypical complex endometrial hyperplasia with a focal area worrisome for well differentiated endometrioid carcinoma 09/14/2012: Total robotic hysterectomy, BSO, right pelvic lymph node dissection.  Frozen section with no evidence of cancer, possible hyperplasia.  Final pathology revealed extensive atypical complex hyperplasia.  4 right lymph nodes negative for malignancy. 08/09/2019: Patient presented to her OB/GYN with an episode of pink discharge followed by frank bleeding after using a douche.  Since that time has had continued brown discharge. 08/09/2019: Vaginal biopsy shows endometrioid adenocarcinoma Oncology History Overview Note  MMR IHC intact ER/PR strongly positive   Endometrial cancer ([place])  08/09/2019 Initial Biopsy   Vaginal biopsy: Endometrioid adenoca, FIGO gr 2   08/19/2019 Initial Diagnosis   Endometrial cancer ([place])   09/06/2019 - 11/23/2019 Radiation Therapy   Site Technique Total Dose (Gy) Dose per Fx (Gy) Completed Fx Beam Energies  Vagina: Pelvis_Bst HDR-brachy 24/24 6 4/4 Ir-192  Vagina: Pelvis IMRT 45/45 1.8 25/25 6X        Interval History: Completed pelvic radiation in late August.  Last saw Dr. Sondra Come at the end of September for follow-up.  At that time she was doing well.  She presents today for follow-up. She is doing very well since finishing treatment. She developed some diarrhea while undergoing radiation that resolved within a couple of weeks of completing treatment. She denies any vaginal bleeding, discharge, pelvic or abdominal pain. She endorses regular urinary function (baseline for her has been some frequency at night). She endorses a good appetite - lost about 13lbs since May, most of which  occurred during treatment.  Denies any nausea, emesis, early satiety.  Her appetite had decreased since treatment but she feels has improved recently.  She is using dilator since her last visit with radiation oncology.  Past Medical/Surgical History: Past Medical History:  Diagnosis Date  . Anticoagulated on Coumadin 2001   2 blood clots  . Bruises easily    pt is on Coumadin;last dose of Coumadin 08/05/11 and then Lovenox started 08/07/11  . DVT (deep vein thrombosis) in pregnancy     1965 post C section;2001 with prolonged driving while on  HRT  . DVT (deep venous thrombosis) ([place]) 2001   Was on Prempro.    . 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 GERD (gastroesophageal reflux disease)    Protonix prn  . High cholesterol    takes Simvastating daily  . History of colon polyps 2007   Dr Sharlett Iles  . 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 Hx of colonic polyps    Dr Sharlett Iles  . Hx of migraines    yrs ago   . Hypertension    takes Benazepril,Amlodipine,and Metoprolol daily  . Osteoporosis   . PONV (postoperative nausea and vomiting)   . Primary localized osteoarthritis of left knee   . PVD (peripheral vascular disease) ([place]) 1965, 2001   abnormal venous doppler findings due to recurrent DVT'S left leg  . Right knee DJD    knees  . Shortness of breath dyspnea   . Vitamin B12 deficiency (non anemic) 01/24/2015  . Vitamin D deficiency    takes VIt D daily    Past Surgical History:  Procedure Laterality Date  . ABDOMINAL HYSTERECTOMY    . APPENDECTOMY  1965  . BREAST LUMPECTOMY  1987  . CESAREAN  SECTION  1961/1965/1966   X 3  . CHOLECYSTECTOMY  1997  . COLONOSCOPY W/ POLYPECTOMY  2007   Dr  Sharlett Iles; due? 2012  . DILATION AND CURETTAGE OF UTERUS  2012   uterine polyp, Dr Kennon Rounds  . ESOPHAGOGASTRODUODENOSCOPY  1997  . HYSTEROSCOPY WITH D & C N/A 08/05/2012   Procedure: DILATATION AND CURETTAGE /HYSTEROSCOPY;  Surgeon: Donnamae Jude, MD;  Location: [facility];  Service:  Gynecology;  Laterality: N/A;  . ROBOTIC ASSISTED TOTAL HYSTERECTOMY WITH BILATERAL SALPINGO OOPHERECTOMY Right 09/14/2012   Procedure: ROBOTIC ASSISTED TOTAL HYSTERECTOMY WITH BILATERAL SALPINGO OOPHORECTOMY ,right pelvic LYMPHNODE dissection;  Surgeon: Imagene Gurney A. Alycia Rossetti, MD;  Location: [facility];  Service: Gynecology;  Laterality: Right;  . TOTAL KNEE ARTHROPLASTY  08/11/2011   Procedure: TOTAL KNEE ARTHROPLASTY;  Surgeon: Lorn Junes, MD;  Location: [facility];  Service: Orthopedics;  Laterality: Right;  DR [place] THIS CASE  . TOTAL KNEE ARTHROPLASTY Left 09/11/2014  . TOTAL KNEE ARTHROPLASTY Left 09/11/2014   Procedure: TOTAL KNEE ARTHROPLASTY;  Surgeon: Elsie Saas, MD;  Location: [facility];  Service: Orthopedics;  Laterality: Left;  . WISDOM TOOTH EXTRACTION      Family History  Problem Relation Age of Onset  . Kidney disease Mother        ? hypertensive  . Hypertension Mother   . Deep vein thrombosis Mother        post ankle fracture  . Leukemia Father   . Cancer Father   . Colon cancer Maternal Grandmother   . Diabetes Other        cousins  . Hypertension Sister   . Anesthesia problems Neg Hx   . Hypotension Neg Hx   . Malignant hyperthermia Neg Hx   . Pseudochol deficiency Neg Hx   . Stroke Neg Hx   . Heart disease Neg Hx   . Breast cancer Neg Hx   . Ovarian cancer Neg Hx   . Uterine cancer Neg Hx     Social History   Socioeconomic History  . Marital status: Divorced    Spouse name: Not on file  . Number of children: 2  . Years of education: Not on file  . Highest education level: High school graduate  Occupational History  . Occupation: Retired  Tobacco Use  . Smoking status: Never Smoker  . Smokeless tobacco: Never Used  Vaping Use  . Vaping Use: Never used  Substance and Sexual Activity  . Alcohol use: No  . Drug use: No  . Sexual activity: Not Currently    Birth control/protection: Post-menopausal    Comment: retired. has 2 daughters   Other Topics Concern  . Not on file  Social History Narrative   Pt lives alone she has 2 daughter - right handed- drinks tea, soda sometimes - No regular exercise   Social Determinants of Health   Financial Resource Strain: Low Risk   . Difficulty of Paying Living Expenses: Not hard at all  Food Insecurity: No Food Insecurity  . Worried About Charity fundraiser in the Last Year: Never true  . Ran Out of Food in the Last Year: Never true  Transportation Needs: No Transportation Needs  . Lack of Transportation (Medical): No  . Lack of Transportation (Non-Medical): No  Physical Activity: Sufficiently Active  . Days of Exercise per Week: 5 days  . Minutes of Exercise per Session: 30 min  Stress: No Stress Concern Present  . Feeling of Stress : Not  at all  Social Connections: Moderately Integrated  . Frequency of Communication with Friends and Family: More than three times a week  . Frequency of Social Gatherings with Friends and Family: More than three times a week  . Attends Religious Services: More than 4 times per year  . Active Member of Clubs or Organizations: Yes  . Attends Archivist Meetings: More than 4 times per year  . Marital Status: Widowed    Current Medications:  Current Outpatient Medications:  .  acetaminophen (TYLENOL) 500 MG tablet, Take 500 mg by mouth every 6 (six) hours as needed., Disp: , Rfl:  .  amLODipine (NORVASC) 5 MG tablet, Take 1 tablet (5 mg total) by mouth daily. Take an extra 5 mg if BP is > 145/90, Disp: 100 tablet, Rfl: 1 .  benazepril (LOTENSIN) 40 MG tablet, Take 1 tablet (40 mg total) by mouth daily., Disp: 90 tablet, Rfl: 1 .  metoprolol succinate (TOPROL-XL) 50 MG 24 hr tablet, Take 1 tablet (50 mg total) by mouth daily. Take with or immediately following a meal., Disp: 90 tablet, Rfl: 1 .  pantoprazole (PROTONIX) 40 MG tablet, TAKE 1 TABLET BY MOUTH ONCE DAILY AS NEEDED FOR ACID REFLUX, Disp: 90 tablet, Rfl: 0 .  simvastatin  (ZOCOR) 10 MG tablet, TAKE 1 TABLET BY MOUTH AT BEDTIME., Disp: 90 tablet, Rfl: 1 .  vitamin B-12 (CYANOCOBALAMIN) 1000 MCG tablet, Take 1,000 mcg by mouth daily., Disp: , Rfl:  .  warfarin (COUMADIN) 5 MG tablet, Take 1/2 tablet daily except take 1 tablet on Mon and Thurs or take as directed by anticoagulation clinic, Disp: 90 tablet, Rfl: 1 .  spironolactone (ALDACTONE) 25 MG tablet, TAKE 1/2 TABLET BY MOUTH DAILY., Disp: 45 tablet, Rfl: 1  Review of Systems: Reports urinary frequency at night, unchanged over some time Denies appetite changes, fevers, chills, fatigue, unexplained weight changes. Denies hearing loss, neck lumps or masses, mouth sores, ringing in ears or voice changes. Denies cough or wheezing.  Denies shortness of breath. Denies chest pain or palpitations. Denies leg swelling. Denies abdominal distention, pain, blood in stools, constipation, diarrhea, nausea, vomiting, or early satiety. Denies pain with intercourse, dysuria, hematuria or incontinence. Denies hot flashes, pelvic pain, vaginal bleeding or vaginal discharge.   Denies joint pain, back pain or muscle pain/cramps. Denies itching, rash, or wounds. Denies dizziness, headaches, numbness or seizures. Denies swollen lymph nodes or glands, denies easy bruising or bleeding. Denies anxiety, depression, confusion, or decreased concentration.  Physical Exam: BP (!) 123/50 (BP Location: Left Arm, Patient Position: Sitting)   Pulse 66   Temp 97.6 F (36.4 C) (Tympanic)   Resp 18   Wt 170 lb 6.4 oz (77.3 kg)   SpO2 99%   BMI 32.20 kg/m  General: Alert, oriented, no acute distress. HEENT: Normocephalic, atraumatic. Chest: Unlabored breathing on room air. Abdomen: Soft, nontender, nondistended, no masses.  No hepatomegaly. Extremities: Grossly normal range of motion.  Warm, well perfused.  No edema bilaterally. Skin: No rashes or lesions noted. Lymphatics: No cervical, supraclavicular, or inguinal adenopathy. GU:  Normal appearing external genitalia without erythema, excoriation, or lesions.  Speculum exam reveals moderately atrophic vaginal mucosa, changes consistent with radiation therapy.  Some narrowing of the vaginal vault, especially at the apex since radiation.  No bleeding.  No visible lesions.  Bimanual exam reveals cuff smooth, no nodularity or lesions.  Rectovaginal exam confirms these findings.  Laboratory & Radiologic Studies: None new  Assessment & Plan: Bonnie Zhang  is a 79 y.o. woman with grade 2 endometrioid adenocarcinoma presenting at the vaginal cuff after prior hysterectomy for complex atypical hyperplasia.    Patient tolerated treatment very well and is NED on exam today.  Given some narrowing at the top of the vagina, discussed again the importance of continued vaginal dilator use.  We reviewed signs and symptoms that would be concerning for disease recurrence.  Per NCCN surveillance recommendations, I recommended surveillance visits every 3 months for the first 2-3 years and then every 6 months until 5 years.  We will alternate visits between my clinic and radiation oncology.  Patient knows to call the clinic if she develops any concerning symptoms before her next scheduled visit.  22 minutes of total time was spent for this patient encounter, including preparation, face-to-face counseling with the patient and coordination of care, and documentation of the encounter.  Jeral Pinch, MD  Division of Gynecologic Oncology  Department of Obstetrics and Gynecology  [facility]

## 2020-02-24 NOTE — Telephone Encounter (Signed)
TC to patient to review meaningful use for Monday's appointment.  No answer, left message to return call.

## 2020-02-25 ENCOUNTER — Other Ambulatory Visit: Payer: Self-pay | Admitting: Internal Medicine

## 2020-02-27 ENCOUNTER — Other Ambulatory Visit: Payer: Self-pay

## 2020-02-27 ENCOUNTER — Encounter: Payer: Self-pay | Admitting: Gynecologic Oncology

## 2020-02-27 ENCOUNTER — Inpatient Hospital Stay: Payer: Medicare Other | Attending: Gynecologic Oncology | Admitting: Gynecologic Oncology

## 2020-02-27 VITALS — BP 123/50 | HR 66 | Temp 97.6°F | Resp 18 | Wt 170.4 lb

## 2020-02-27 DIAGNOSIS — Z90722 Acquired absence of ovaries, bilateral: Secondary | ICD-10-CM | POA: Insufficient documentation

## 2020-02-27 DIAGNOSIS — Z923 Personal history of irradiation: Secondary | ICD-10-CM | POA: Diagnosis not present

## 2020-02-27 DIAGNOSIS — Z90710 Acquired absence of both cervix and uterus: Secondary | ICD-10-CM | POA: Insufficient documentation

## 2020-02-27 DIAGNOSIS — C541 Malignant neoplasm of endometrium: Secondary | ICD-10-CM | POA: Diagnosis not present

## 2020-02-27 NOTE — Patient Instructions (Signed)
It was good to see you today!  I do not see or feel anything on your exam today that looks like cancer.  You have healed well after radiation.  Remember, especially these first couple of months, please use the vaginal dilator times a week to help keep the vagina open.  This makes our exams easier to do in the future.  I will see you back in 6 months. You will see Dr. Sondra Come in 3 months.  If you develop any concerning symptoms, such as vaginal bleeding, discharge, change to your bowel movements, abdominal pain, unintentional weight loss, please call the clinic sooner to be seen at [phone]

## 2020-03-04 NOTE — Progress Notes (Signed)
Subjective:    Patient ID: Bonnie Zhang, female    DOB: 08/10/1940, 79 y.o.   MRN: [id number]   This visit occurred during the SARS-CoV-2 public health emergency.  Safety protocols were in place, including screening questions prior to the visit, additional usage of staff PPE, and extensive cleaning of exam room while observing appropriate contact time as indicated for disinfecting solutions.    HPI She is here for a physical exam.   She completed radiation for her endometrial cancer ( in the vaginal area).  Overall she did well with the treatment.  She sees the radiation oncologist early next year.  She denies any concerns.  Medications and allergies reviewed with patient and updated if appropriate.  Patient Active Problem List   Diagnosis Date Noted  . Endometrial cancer ([place]) 08/19/2019  . Candidiasis 08/19/2019  . Brain mass 08/12/2019  . TIA (transient ischemic attack) 01/03/2019  . Hyperglycemia 06/29/2017  . Vertigo 04/07/2017  . Long term (current) use of anticoagulants 01/07/2017  . Carotid artery disease ([place]) 02/01/2016  . Bilateral leg edema 06/21/2015  . Vitamin B12 deficiency (non anemic) 01/24/2015  . 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 Primary localized osteoarthritis of left knee   . Encounter for therapeutic drug monitoring 06/07/2013  . GERD (gastroesophageal reflux disease)   . PVD (peripheral vascular disease) ([place])   . 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 10/09/2010  . Osteoporosis 03/21/2010  . History of colonic polyps 03/20/2008  . Vitamin D deficiency 08/25/2007  . HYPERLIPIDEMIA 03/18/2007  . Dysmetabolic syndrome X 63/03/6008  . Essential hypertension 03/18/2007  . DVT, HX OF 07/23/2006    Current Outpatient Medications on File Prior to Visit  Medication Sig Dispense Refill  . acetaminophen (TYLENOL) 500 MG tablet Take 500 mg by mouth every 6 (six) hours  as needed.    Marland Kitchen amLODipine (NORVASC) 5 MG tablet Take 1 tablet (5 mg total) by mouth daily. Take an extra 5 mg if BP is > 145/90 100 tablet 1  . benazepril (LOTENSIN) 40 MG tablet Take 1 tablet (40 mg total) by mouth daily. 90 tablet 1  . metoprolol succinate (TOPROL-XL) 50 MG 24 hr tablet Take 1 tablet (50 mg total) by mouth daily. Take with or immediately following a meal. 90 tablet 1  . pantoprazole (PROTONIX) 40 MG tablet TAKE 1 TABLET BY MOUTH ONCE DAILY AS NEEDED FOR ACID REFLUX 90 tablet 0  . simvastatin (ZOCOR) 10 MG tablet TAKE 1 TABLET BY MOUTH AT BEDTIME. 90 tablet 1  . spironolactone (ALDACTONE) 25 MG tablet TAKE 1/2 TABLET BY MOUTH DAILY. 45 tablet 1  . vitamin B-12 (CYANOCOBALAMIN) 1000 MCG tablet Take 1,000 mcg by mouth daily.    Marland Kitchen warfarin (COUMADIN) 5 MG tablet Take 1/2 tablet daily except take 1 tablet on Mon and Thurs or take as directed by anticoagulation clinic 90 tablet 1   No current facility-administered medications on file prior to visit.    Past Medical History:  Diagnosis Date  . Anticoagulated on Coumadin 2001   2 blood clots  . Bruises easily    pt is on Coumadin;last dose of Coumadin 08/05/11 and then Lovenox started 08/07/11  . DVT (deep vein thrombosis) in pregnancy     1965 post C section;2001 with prolonged driving while on  HRT  . DVT (deep venous thrombosis) ([place]) 2001   Was on Prempro.    . 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  GERD (gastroesophageal reflux disease)    Protonix prn  . High cholesterol    takes Simvastating daily  . History of colon polyps 2007   Dr Sharlett Iles  . 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 Hx of colonic polyps    Dr Sharlett Iles  . Hx of migraines    yrs ago   . Hypertension    takes Benazepril,Amlodipine,and Metoprolol daily  . Osteoporosis   . PONV (postoperative nausea and vomiting)   . Primary localized osteoarthritis of left knee   . PVD (peripheral vascular disease) ([place]) 1965, 2001   abnormal venous doppler  findings due to recurrent DVT'S left leg  . Right knee DJD    knees  . Shortness of breath dyspnea   . Vitamin B12 deficiency (non anemic) 01/24/2015  . Vitamin D deficiency    takes VIt D daily    Past Surgical History:  Procedure Laterality Date  . ABDOMINAL HYSTERECTOMY    . APPENDECTOMY  1965  . BREAST LUMPECTOMY  1987  . CESAREAN SECTION  1961/1965/1966   X 3  . CHOLECYSTECTOMY  1997  . COLONOSCOPY W/ POLYPECTOMY  2007   Dr  Sharlett Iles; due? 2012  . DILATION AND CURETTAGE OF UTERUS  2012   uterine polyp, Dr Kennon Rounds  . ESOPHAGOGASTRODUODENOSCOPY  1997  . HYSTEROSCOPY WITH D & C N/A 08/05/2012   Procedure: DILATATION AND CURETTAGE /HYSTEROSCOPY;  Surgeon: Donnamae Jude, MD;  Location: [facility];  Service: Gynecology;  Laterality: N/A;  . ROBOTIC ASSISTED TOTAL HYSTERECTOMY WITH BILATERAL SALPINGO OOPHERECTOMY Right 09/14/2012   Procedure: ROBOTIC ASSISTED TOTAL HYSTERECTOMY WITH BILATERAL SALPINGO OOPHORECTOMY ,right pelvic LYMPHNODE dissection;  Surgeon: Imagene Gurney A. Alycia Rossetti, MD;  Location: [facility];  Service: Gynecology;  Laterality: Right;  . TOTAL KNEE ARTHROPLASTY  08/11/2011   Procedure: TOTAL KNEE ARTHROPLASTY;  Surgeon: Lorn Junes, MD;  Location: [facility];  Service: Orthopedics;  Laterality: Right;  DR [place] THIS CASE  . TOTAL KNEE ARTHROPLASTY Left 09/11/2014  . TOTAL KNEE ARTHROPLASTY Left 09/11/2014   Procedure: TOTAL KNEE ARTHROPLASTY;  Surgeon: Elsie Saas, MD;  Location: [facility];  Service: Orthopedics;  Laterality: Left;  . WISDOM TOOTH EXTRACTION      Social History   Socioeconomic History  . Marital status: Divorced    Spouse name: Not on file  . Number of children: 2  . Years of education: Not on file  . Highest education level: High school graduate  Occupational History  . Occupation: Retired  Tobacco Use  . Smoking status: Never Smoker  . Smokeless tobacco: Never Used  Vaping Use  . Vaping Use: Never used  Substance and Sexual Activity  .  Alcohol use: No  . Drug use: No  . Sexual activity: Not Currently    Birth control/protection: Post-menopausal    Comment: retired. has 2 daughters  Other Topics Concern  . Not on file  Social History Narrative   Pt lives alone she has 2 daughter - right handed- drinks tea, soda sometimes - No regular exercise   Social Determinants of Health   Financial Resource Strain: Low Risk   . Difficulty of Paying Living Expenses: Not hard at all  Food Insecurity: No Food Insecurity  . Worried About Charity fundraiser in the Last Year: Never true  . Ran Out of Food in the Last Year: Never true  Transportation Needs: No Transportation Needs  . Lack of Transportation (Medical): No  . Lack of Transportation (Non-Medical): No  Physical Activity:  Sufficiently Active  . Days of Exercise per Week: 5 days  . Minutes of Exercise per Session: 30 min  Stress: No Stress Concern Present  . Feeling of Stress : Not at all  Social Connections: Moderately Integrated  . Frequency of Communication with Friends and Family: More than three times a week  . Frequency of Social Gatherings with Friends and Family: More than three times a week  . Attends Religious Services: More than 4 times per year  . Active Member of Clubs or Organizations: Yes  . Attends Archivist Meetings: More than 4 times per year  . Marital Status: Widowed    Family History  Problem Relation Age of Onset  . Kidney disease Mother        ? hypertensive  . Hypertension Mother   . Deep vein thrombosis Mother        post ankle fracture  . Leukemia Father   . Cancer Father   . Colon cancer Maternal Grandmother   . Diabetes Other        cousins  . Hypertension Sister   . Anesthesia problems Neg Hx   . Hypotension Neg Hx   . Malignant hyperthermia Neg Hx   . Pseudochol deficiency Neg Hx   . Stroke Neg Hx   . Heart disease Neg Hx   . Breast cancer Neg Hx   . Ovarian cancer Neg Hx   . Uterine cancer Neg Hx     Review  of Systems  Constitutional: Negative for chills and fever.  Eyes: Negative for visual disturbance.  Respiratory: Negative for cough, shortness of breath and wheezing.   Cardiovascular: Positive for leg swelling (mild). Negative for chest pain and palpitations.  Gastrointestinal: Negative for abdominal pain, constipation, diarrhea and nausea.       No gerd  Genitourinary: Positive for frequency (nocturia - varies). Negative for dysuria and hematuria.  Musculoskeletal: Negative for arthralgias and back pain.  Skin: Negative for rash.  Neurological: Negative for dizziness, light-headedness and headaches.  Psychiatric/Behavioral: Negative for dysphoric mood. The patient is not nervous/anxious.        Objective:   Vitals:   03/05/20 1025  BP: 122/78  Pulse: 72  Temp: 98 F (36.7 C)  SpO2: 98%   Filed Weights   03/05/20 1025  Weight: 171 lb (77.6 kg)   Body mass index is 32.31 kg/m.  BP Readings from Last 3 Encounters:  03/05/20 122/78  02/27/20 (!) 123/50  12/26/19 134/69    Wt Readings from Last 3 Encounters:  03/05/20 171 lb (77.6 kg)  02/27/20 170 lb 6.4 oz (77.3 kg)  12/26/19 163 lb 9.6 oz (74.2 kg)     Physical Exam Constitutional: She appears well-developed and well-nourished. No distress.  HENT:  Head: Normocephalic and atraumatic.  Right Ear: External ear normal. Normal ear canal and TM Left Ear: External ear normal.  Normal ear canal and TM Mouth/Throat: Oropharynx is clear and moist.  Eyes: Conjunctivae and EOM are normal.  Neck: Neck supple. No tracheal deviation present. No thyromegaly present.  No carotid bruit  Cardiovascular: Normal rate, regular rhythm and normal heart sounds.   No murmur heard.  No edema. Pulmonary/Chest: Effort normal and breath sounds normal. No respiratory distress. She has no wheezes. She has no rales.  Breast: deferred   Abdominal: Soft. She exhibits no distension. There is no tenderness.  Lymphadenopathy: She has no  cervical adenopathy.  Skin: Skin is warm and dry. She is not diaphoretic.  Psychiatric:  She has a normal mood and affect. Her behavior is normal.        Assessment & Plan:   Physical exam: Screening blood work    ordered Immunizations  Deferred flu, pneumonia vaccines.  Discussed shingrix, covid booster Colonoscopy  n/a Mammogram  Up to date  Gyn - follow with [place] - has OP -- deferred-she would not consider treatment.  Will discuss at her next visit. Eye exams  Up to date  Exercise  Encouraged regular exercise Weight  Encouraged weight loss Substance abuse none      See Problem List for Assessment and Plan of chronic medical problems.

## 2020-03-04 NOTE — Patient Instructions (Addendum)
Blood work was ordered.     No immunization administered today.   Medications changes include :   none    Please followup in 6 months    Health Maintenance, Female Adopting a healthy lifestyle and getting preventive care are important in promoting health and wellness. Ask your health care provider about:  The right schedule for you to have regular tests and exams.  Things you can do on your own to prevent diseases and keep yourself healthy. What should I know about diet, weight, and exercise? Eat a healthy diet   Eat a diet that includes plenty of vegetables, fruits, low-fat dairy products, and lean protein.  Do not eat a lot of foods that are high in solid fats, added sugars, or sodium. Maintain a healthy weight Body mass index (BMI) is used to identify weight problems. It estimates body fat based on height and weight. Your health care provider can help determine your BMI and help you achieve or maintain a healthy weight. Get regular exercise Get regular exercise. This is one of the most important things you can do for your health. Most adults should:  Exercise for at least 150 minutes each week. The exercise should increase your heart rate and make you sweat (moderate-intensity exercise).  Do strengthening exercises at least twice a week. This is in addition to the moderate-intensity exercise.  Spend less time sitting. Even light physical activity can be beneficial. Watch cholesterol and blood lipids Have your blood tested for lipids and cholesterol at 78 years of age, then have this test every 5 years. Have your cholesterol levels checked more often if:  Your lipid or cholesterol levels are high.  You are older than 78 years of age.  You are at high risk for heart disease. What should I know about cancer screening? Depending on your health history and family history, you may need to have cancer screening at various ages. This may include screening for:  Breast  cancer.  Cervical cancer.  Colorectal cancer.  Skin cancer.  Lung cancer. What should I know about heart disease, diabetes, and high blood pressure? Blood pressure and heart disease  High blood pressure causes heart disease and increases the risk of stroke. This is more likely to develop in people who have high blood pressure readings, are of African descent, or are overweight.  Have your blood pressure checked: ? Every 3-5 years if you are 39-78 years of age. ? Every year if you are 98 years old or older. Diabetes Have regular diabetes screenings. This checks your fasting blood sugar level. Have the screening done:  Once every three years after age 86 if you are at a normal weight and have a low risk for diabetes.  More often and at a younger age if you are overweight or have a high risk for diabetes. What should I know about preventing infection? Hepatitis B If you have a higher risk for hepatitis B, you should be screened for this virus. Talk with your health care provider to find out if you are at risk for hepatitis B infection. Hepatitis C Testing is recommended for:  Everyone born from [birth year] through 1964.  Anyone with known risk factors for hepatitis C. Sexually transmitted infections (STIs)  Get screened for STIs, including gonorrhea and chlamydia, if: ? You are sexually active and are younger than 78 years of age. ? You are older than 78 years of age and your health care provider tells you that you are at risk  for this type of infection. ? Your sexual activity has changed since you were last screened, and you are at increased risk for chlamydia or gonorrhea. Ask your health care provider if you are at risk.  Ask your health care provider about whether you are at high risk for HIV. Your health care provider may recommend a prescription medicine to help prevent HIV infection. If you choose to take medicine to prevent HIV, you should first get tested for HIV. You should  then be tested every 3 months for as long as you are taking the medicine. Pregnancy  If you are about to stop having your period (premenopausal) and you may become pregnant, seek counseling before you get pregnant.  Take 400 to 800 micrograms (mcg) of folic acid every day if you become pregnant.  Ask for birth control (contraception) if you want to prevent pregnancy. Osteoporosis and menopause Osteoporosis is a disease in which the bones lose minerals and strength with aging. This can result in bone fractures. If you are 56 years old or older, or if you are at risk for osteoporosis and fractures, ask your health care provider if you should:  Be screened for bone loss.  Take a calcium or vitamin D supplement to lower your risk of fractures.  Be given hormone replacement therapy (HRT) to treat symptoms of menopause. Follow these instructions at home: Lifestyle  Do not use any products that contain nicotine or tobacco, such as cigarettes, e-cigarettes, and chewing tobacco. If you need help quitting, ask your health care provider.  Do not use street drugs.  Do not share needles.  Ask your health care provider for help if you need support or information about quitting drugs. Alcohol use  Do not drink alcohol if: ? Your health care provider tells you not to drink. ? You are pregnant, may be pregnant, or are planning to become pregnant.  If you drink alcohol: ? Limit how much you use to 0-1 drink a day. ? Limit intake if you are breastfeeding.  Be aware of how much alcohol is in your drink. In the [place] one drink equals one 12 oz bottle of beer (355 mL), one 5 oz glass of wine (148 mL), or one 1 oz glass of hard liquor (44 mL). General instructions  Schedule regular health, dental, and eye exams.  Stay current with your vaccines.  Tell your health care provider if: ? You often feel depressed. ? You have ever been abused or do not feel safe at home. Summary  Adopting a  healthy lifestyle and getting preventive care are important in promoting health and wellness.  Follow your health care provider's instructions about healthy diet, exercising, and getting tested or screened for diseases.  Follow your health care provider's instructions on monitoring your cholesterol and blood pressure. This information is not intended to replace advice given to you by your health care provider. Make sure you discuss any questions you have with your health care provider. Document Revised: 03/10/2018 Document Reviewed: 03/10/2018 Elsevier Patient Education  2020 [employer].

## 2020-03-05 ENCOUNTER — Encounter: Payer: Self-pay | Admitting: Internal Medicine

## 2020-03-05 ENCOUNTER — Other Ambulatory Visit: Payer: Self-pay

## 2020-03-05 ENCOUNTER — Ambulatory Visit (INDEPENDENT_AMBULATORY_CARE_PROVIDER_SITE_OTHER): Payer: Medicare Other | Admitting: Internal Medicine

## 2020-03-05 VITALS — BP 122/78 | HR 72 | Temp 98.0°F | Ht 61.0 in | Wt 171.0 lb

## 2020-03-05 DIAGNOSIS — M810 Age-related osteoporosis without current pathological fracture: Secondary | ICD-10-CM | POA: Diagnosis not present

## 2020-03-05 DIAGNOSIS — Z86718 Personal history of other venous thrombosis and embolism: Secondary | ICD-10-CM

## 2020-03-05 DIAGNOSIS — R739 Hyperglycemia, unspecified: Secondary | ICD-10-CM

## 2020-03-05 DIAGNOSIS — E559 Vitamin D deficiency, unspecified: Secondary | ICD-10-CM

## 2020-03-05 DIAGNOSIS — I10 Essential (primary) hypertension: Secondary | ICD-10-CM

## 2020-03-05 DIAGNOSIS — E782 Mixed hyperlipidemia: Secondary | ICD-10-CM

## 2020-03-05 DIAGNOSIS — Z0000 Encounter for general adult medical examination without abnormal findings: Secondary | ICD-10-CM

## 2020-03-05 DIAGNOSIS — K219 Gastro-esophageal reflux disease without esophagitis: Secondary | ICD-10-CM

## 2020-03-05 NOTE — Assessment & Plan Note (Signed)
Chronic Check lipid panel  Continue simvastatin 10 mg daily Regular exercise and healthy diet encouraged

## 2020-03-05 NOTE — Assessment & Plan Note (Signed)
Chronic GERD is controlled Only takes pantoprazole 40 mg as needed, which is not often Continue above

## 2020-03-05 NOTE — Assessment & Plan Note (Signed)
Chronic BP well controlled Continue amlodipine 5 mg daily, benazepril 40 mg daily, metoprolol XL 50 mg daily, spironolactone 12.5 mg daily cmp

## 2020-03-05 NOTE — Assessment & Plan Note (Signed)
Chronic Taking vitamin D daily Check vitamin D level  

## 2020-03-05 NOTE — Assessment & Plan Note (Signed)
Chronic Check a1c Low sugar / carb diet Stressed regular exercise  

## 2020-03-05 NOTE — Assessment & Plan Note (Signed)
Chronic Has known osteoporosis-does not want to consider treatment Due for DEXA-deferred today since she know she does not want to consider treatment Encourage more regular exercise We will discuss getting DEXA at her next visit Check vitamin D level

## 2020-03-05 NOTE — Assessment & Plan Note (Signed)
Chronic Needs lifelong anticoagulation-currently on warfarin INR monitored by her Coumadin clinic

## 2020-03-08 ENCOUNTER — Other Ambulatory Visit (INDEPENDENT_AMBULATORY_CARE_PROVIDER_SITE_OTHER): Payer: Medicare Other

## 2020-03-08 ENCOUNTER — Other Ambulatory Visit: Payer: Self-pay

## 2020-03-08 DIAGNOSIS — I10 Essential (primary) hypertension: Secondary | ICD-10-CM | POA: Diagnosis not present

## 2020-03-08 DIAGNOSIS — Z0000 Encounter for general adult medical examination without abnormal findings: Secondary | ICD-10-CM

## 2020-03-08 DIAGNOSIS — E782 Mixed hyperlipidemia: Secondary | ICD-10-CM | POA: Diagnosis not present

## 2020-03-08 DIAGNOSIS — R739 Hyperglycemia, unspecified: Secondary | ICD-10-CM

## 2020-03-08 DIAGNOSIS — M810 Age-related osteoporosis without current pathological fracture: Secondary | ICD-10-CM | POA: Diagnosis not present

## 2020-03-08 DIAGNOSIS — E559 Vitamin D deficiency, unspecified: Secondary | ICD-10-CM

## 2020-03-08 LAB — LIPID PANEL
Cholesterol: 143 mg/dL (ref 0–200)
HDL: 62.7 mg/dL (ref >39.00)
LDL Cholesterol: 62 mg/dL (ref 0–99)
NonHDL: 79.9
Total CHOL/HDL Ratio: 2
Triglycerides: 90 mg/dL (ref 0.0–149.0)
VLDL: 18 mg/dL (ref 0.0–40.0)

## 2020-03-08 LAB — CBC WITH DIFFERENTIAL/PLATELET
Basophils Absolute: 0 10*3/uL (ref 0.0–0.1)
Basophils Relative: 0.4 % (ref 0.0–3.0)
Eosinophils Absolute: 0.1 10*3/uL (ref 0.0–0.7)
Eosinophils Relative: 2.6 % (ref 0.0–5.0)
HCT: 38.4 % (ref 36.0–46.0)
Hemoglobin: 12.8 g/dL (ref 12.0–15.0)
Lymphocytes Relative: 26.6 % (ref 12.0–46.0)
Lymphs Abs: 0.9 10*3/uL (ref 0.7–4.0)
MCHC: 33.2 g/dL (ref 30.0–36.0)
MCV: 89.8 fl (ref 78.0–100.0)
Monocytes Absolute: 0.3 10*3/uL (ref 0.1–1.0)
Monocytes Relative: 8.8 % (ref 3.0–12.0)
Neutro Abs: 2 10*3/uL (ref 1.4–7.7)
Neutrophils Relative %: 61.6 % (ref 43.0–77.0)
Platelets: 169 10*3/uL (ref 150.0–400.0)
RBC: 4.28 Mil/uL (ref 3.87–5.11)
RDW: 13.1 % (ref 11.5–15.5)
WBC: 3.2 10*3/uL — ABNORMAL LOW (ref 4.0–10.5)

## 2020-03-08 LAB — COMPREHENSIVE METABOLIC PANEL
ALT: 7 U/L (ref 0–35)
AST: 13 U/L (ref 0–37)
Albumin: 4.1 g/dL (ref 3.5–5.2)
Alkaline Phosphatase: 79 U/L (ref 39–117)
BUN: 15 mg/dL (ref 6–23)
CO2: 31 mEq/L (ref 19–32)
Calcium: 9.7 mg/dL (ref 8.4–10.5)
Chloride: 104 mEq/L (ref 96–112)
Creatinine, Ser: 0.92 mg/dL (ref 0.40–1.20)
GFR: 59.63 mL/min — ABNORMAL LOW (ref >60.00)
Glucose, Bld: 95 mg/dL (ref 70–99)
Potassium: 3.9 mEq/L (ref 3.5–5.1)
Sodium: 141 mEq/L (ref 135–145)
Total Bilirubin: 0.7 mg/dL (ref 0.2–1.2)
Total Protein: 7.5 g/dL (ref 6.0–8.3)

## 2020-03-08 LAB — HEMOGLOBIN A1C: Hgb A1c MFr Bld: 5.3 % (ref 4.6–6.5)

## 2020-03-08 LAB — VITAMIN D 25 HYDROXY (VIT D DEFICIENCY, FRACTURES): VITD: 17.79 ng/mL — ABNORMAL LOW (ref 30.00–100.00)

## 2020-03-08 LAB — TSH: TSH: 1.48 u[IU]/mL (ref 0.35–4.50)

## 2020-03-08 LAB — VITAMIN B12: Vitamin B-12: 191 pg/mL — ABNORMAL LOW (ref 211–911)

## 2020-03-10 ENCOUNTER — Other Ambulatory Visit: Payer: Medicare Other

## 2020-03-10 ENCOUNTER — Other Ambulatory Visit: Payer: Self-pay

## 2020-03-10 DIAGNOSIS — Z20822 Contact with and (suspected) exposure to covid-19: Secondary | ICD-10-CM

## 2020-03-12 ENCOUNTER — Telehealth: Payer: Self-pay | Admitting: Internal Medicine

## 2020-03-12 LAB — SARS-COV-2, NAA 2 DAY TAT

## 2020-03-12 LAB — NOVEL CORONAVIRUS, NAA: SARS-CoV-2, NAA: NOT DETECTED

## 2020-03-12 NOTE — Telephone Encounter (Signed)
Please return  call to discuss lab results 

## 2020-03-12 NOTE — Telephone Encounter (Signed)
Results given to patient today. 

## 2020-03-15 ENCOUNTER — Other Ambulatory Visit: Payer: Self-pay | Admitting: Internal Medicine

## 2020-03-16 ENCOUNTER — Ambulatory Visit: Payer: Medicare Other

## 2020-03-27 ENCOUNTER — Other Ambulatory Visit: Payer: Self-pay | Admitting: Internal Medicine

## 2020-04-10 ENCOUNTER — Ambulatory Visit (INDEPENDENT_AMBULATORY_CARE_PROVIDER_SITE_OTHER): Payer: Medicare Other | Admitting: General Practice

## 2020-04-10 ENCOUNTER — Other Ambulatory Visit: Payer: Self-pay

## 2020-04-10 DIAGNOSIS — Z7901 Long term (current) use of anticoagulants: Secondary | ICD-10-CM | POA: Diagnosis not present

## 2020-04-10 DIAGNOSIS — Z86718 Personal history of other venous thrombosis and embolism: Secondary | ICD-10-CM

## 2020-04-10 LAB — POCT INR: INR: 2.2 (ref 2.0–3.0)

## 2020-04-10 NOTE — Progress Notes (Signed)
Agree with management.  Davaun Quintela J Ludene Stokke, MD  

## 2020-04-10 NOTE — Patient Instructions (Signed)
Pre visit review using our clinic review tool, if applicable. No additional management support is needed unless otherwise documented below in the visit note.  Continue to take 1/2 tablet daily except 1 tablet on Mondays and Thursdays.  Re-check in to 6 weeks.    

## 2020-05-22 ENCOUNTER — Ambulatory Visit (INDEPENDENT_AMBULATORY_CARE_PROVIDER_SITE_OTHER): Payer: Medicare Other | Admitting: General Practice

## 2020-05-22 ENCOUNTER — Other Ambulatory Visit: Payer: Self-pay

## 2020-05-22 ENCOUNTER — Other Ambulatory Visit: Payer: Self-pay | Admitting: General Practice

## 2020-05-22 DIAGNOSIS — Z7901 Long term (current) use of anticoagulants: Secondary | ICD-10-CM

## 2020-05-22 DIAGNOSIS — Z86718 Personal history of other venous thrombosis and embolism: Secondary | ICD-10-CM

## 2020-05-22 LAB — POCT INR: INR: 3 (ref 2.0–3.0)

## 2020-05-22 MED ORDER — WARFARIN SODIUM 5 MG PO TABS: ORAL_TABLET | ORAL | 1 refills | Status: DC

## 2020-05-22 NOTE — Progress Notes (Signed)
Agree with management.  Bellanie Matthew J Otha Rickles, MD  

## 2020-05-22 NOTE — Patient Instructions (Addendum)
Pre visit review using our clinic review tool, if applicable. No additional management support is needed unless otherwise documented below in the visit note.  Skip dosage tomorrow and then continue to take 1/2 tablet daily except 1 tablet on Mondays and Thursdays.  Re-check in to 6 weeks.

## 2020-05-23 NOTE — Progress Notes (Signed)
Radiation Oncology         [phone] ________________________________  Name: Bonnie Zhang MRN: [id number]  Date: 05/24/2020  DOB: 1940/12/15  Follow-Up Visit Note  CC: Bonnie Rail, MD  Bonnie Rail, MD    ICD-10-CM   1. 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 She was last seen by Dr. Berline Zhang on 02/27/2020, at which time there was no evidence of disease.  On review of systems, she reports no new medical issues.. She denies vaginal bleeding pelvic pain or abdominal bloating.  She denies any hematuria or rectal bleeding or problems with diarrhea.  She continues to use her vaginal dilator 3 times a week.    ALLERGIES:  is allergic to vicodin [hydrocodone-acetaminophen], aspirin, atorvastatin, and hctz [hydrochlorothiazide].  Meds: Current Outpatient Medications  Medication Sig Dispense Refill   acetaminophen (TYLENOL) 500 MG tablet Take 500 mg by mouth every 6 (six) hours as needed.     amLODipine (NORVASC) 5 MG tablet TAKE 1 TABLET BY MOUTH DAILY. TAKE AN EXTRA TABLET IF BLOOD PRESSURE IS GREATER THAN 145/90 100 tablet 1   benazepril (LOTENSIN) 40 MG tablet TAKE 1 TABLET BY MOUTH DAILY. 90 tablet 1   metoprolol succinate (TOPROL-XL) 50 MG 24 hr tablet TAKE 1 TABLET BY MOUTH DAILY. TAKE WITH OR IMMEDIATELY FOLLOWING A MEAL. 90 tablet 1   pantoprazole (PROTONIX) 40 MG tablet TAKE 1 TABLET BY MOUTH ONCE DAILY AS NEEDED FOR ACID REFLUX 90 tablet 0   simvastatin (ZOCOR) 10 MG tablet TAKE 1 TABLET BY MOUTH  AT BEDTIME. 90 tablet 1   spironolactone (ALDACTONE) 25 MG tablet TAKE 1/2 TABLET BY MOUTH DAILY. 45 tablet 1   vitamin B-12 (CYANOCOBALAMIN) 1000 MCG tablet Take 1,000 mcg by mouth daily.     warfarin (COUMADIN) 5 MG tablet Take 1/2 tablet daily except take 1 tablet on Mon and Thurs or take as directed by anticoagulation clinic 90 tablet 1   No current facility-administered medications for this encounter.    Physical Findings: The patient is in no acute distress. Patient is alert and oriented.  height is 5\' 1"  (1.549 m) and weight is 168 lb 2 oz (76.3 kg). Her temporal temperature is 96.9 F (36.1 C) (abnormal). Her blood pressure is 135/45 (abnormal) and her pulse is 64. Her respiration is 18 and oxygen saturation is 100%.  No significant changes. Lungs are clear to auscultation bilaterally. Heart has regular rate and rhythm. No palpable cervical, supraclavicular, or axillary adenopathy. Abdomen soft, non-tender, normal bowel sounds.  On pelvic examination the external genitalia were unremarkable. A speculum exam was performed. There are no mucosal lesions noted in the vaginal vault. On bimanual examination there were no pelvic masses appreciated.  Some radiation changes noted along the right proximal vagina.  A pediatric speculum was used to obtain a better view of the proximal vagina.  Vaginal cuff intact.  Lab Findings: Lab Results  Component Value Date   WBC 3.2 (L) 03/08/2020   HGB 12.8 03/08/2020   HCT 38.4 03/08/2020   MCV 89.8 03/08/2020  PLT 169.0 03/08/2020    Radiographic Findings: No results found.  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  Plan: The patient is scheduled to follow-up with Dr. Mickeal Zhang on 08/14/2020 and with Dr. Berline Zhang on 08/28/20. She will follow-up with radiation oncology in six months.  Total time  spent in this encounter was 20  minutes which included reviewing the patient's most recent follow-up with Dr. Berline Zhang, physical examination, and documentation.  ____________________________________   Blair Promise, PhD, MD  This document serves as a record of services personally performed by Gery Pray, MD. It was created on his behalf by Clerance Lav, a trained medical scribe. The creation of this record is based on the scribe's personal observations and the provider's statements to them. This document has been checked and approved by the attending provider.

## 2020-05-24 ENCOUNTER — Ambulatory Visit
Admission: RE | Admit: 2020-05-24 | Discharge: 2020-05-24 | Disposition: A | Discharge status: Home / Self Care | Payer: Medicare Other | Source: Ambulatory Visit | Attending: Radiation Oncology | Admitting: Radiation Oncology

## 2020-05-24 ENCOUNTER — Encounter: Payer: Self-pay | Admitting: *Deleted

## 2020-05-24 ENCOUNTER — Other Ambulatory Visit: Payer: Self-pay

## 2020-05-24 DIAGNOSIS — Z7901 Long term (current) use of anticoagulants: Secondary | ICD-10-CM | POA: Insufficient documentation

## 2020-05-24 DIAGNOSIS — C541 Malignant neoplasm of endometrium: Secondary | ICD-10-CM | POA: Diagnosis present

## 2020-05-24 DIAGNOSIS — Z79899 Other long term (current) drug therapy: Secondary | ICD-10-CM | POA: Diagnosis not present

## 2020-05-24 DIAGNOSIS — Z08 Encounter for follow-up examination after completed treatment for malignant neoplasm: Secondary | ICD-10-CM | POA: Diagnosis not present

## 2020-05-24 HISTORY — DX: Personal history of irradiation: Z92.3

## 2020-05-24 NOTE — Progress Notes (Signed)
Patient is here today for follow up to radiation to pelvis completed 09/06/2019-11/23/2019.  Patient denies having any pain.  Patient denies having any diarrhea/constipation/nausea/vomiting.  Denies bladder concerns.  Denies vaginal bleeding or discharge.  Denies any concerns for appetite or energy level.  Reports she uses the vaginal dilator 3 times a week.     Vitals:   05/24/20 0939  BP: (!) 135/45  Pulse: 64  Resp: 18  Temp: (!) 96.9 F (36.1 C)  TempSrc: Temporal  SpO2: 100%  Weight: 168 lb 2 oz (76.3 kg)  Height: 5\' 1"  (1.549 m)

## 2020-06-26 ENCOUNTER — Other Ambulatory Visit: Payer: Self-pay

## 2020-06-26 ENCOUNTER — Ambulatory Visit (INDEPENDENT_AMBULATORY_CARE_PROVIDER_SITE_OTHER): Payer: Medicare Other | Admitting: General Practice

## 2020-06-26 DIAGNOSIS — Z7901 Long term (current) use of anticoagulants: Secondary | ICD-10-CM | POA: Diagnosis not present

## 2020-06-26 DIAGNOSIS — Z86718 Personal history of other venous thrombosis and embolism: Secondary | ICD-10-CM | POA: Diagnosis not present

## 2020-06-26 LAB — POCT INR: INR: 2 (ref 2.0–3.0)

## 2020-06-26 NOTE — Patient Instructions (Addendum)
Pre visit review using our clinic review tool, if applicable. No additional management support is needed unless otherwise documented below in the visit note.  Continue to take 1/2 tablet daily except 1 tablet on Mondays and Thursdays.  Re-check in to 6 weeks.

## 2020-06-26 NOTE — Progress Notes (Signed)
Agree with management.  Eris Hannan J Jedediah Noda, MD  

## 2020-07-03 ENCOUNTER — Ambulatory Visit: Payer: Medicare Other

## 2020-07-27 ENCOUNTER — Other Ambulatory Visit: Payer: Self-pay | Admitting: Radiation Therapy

## 2020-08-07 ENCOUNTER — Ambulatory Visit (INDEPENDENT_AMBULATORY_CARE_PROVIDER_SITE_OTHER): Payer: Medicare Other | Admitting: General Practice

## 2020-08-07 ENCOUNTER — Other Ambulatory Visit: Payer: Self-pay

## 2020-08-07 DIAGNOSIS — Z7901 Long term (current) use of anticoagulants: Secondary | ICD-10-CM

## 2020-08-07 DIAGNOSIS — Z86718 Personal history of other venous thrombosis and embolism: Secondary | ICD-10-CM

## 2020-08-07 LAB — POCT INR: INR: 3.1 — AB (ref 2.0–3.0)

## 2020-08-07 NOTE — Patient Instructions (Signed)
Pre visit review using our clinic review tool, if applicable. No additional management support is needed unless otherwise documented below in the visit note.  Skip dosage tomorrow (5/11) and then continue to take 1/2 tablet daily except 1 tablet on Mondays and Thursdays.  Re-check in to 4 weeks.

## 2020-08-07 NOTE — Progress Notes (Signed)
Agree with management.  Bonnie Saxer J Payson Crumby, MD  

## 2020-08-10 ENCOUNTER — Ambulatory Visit
Admission: RE | Admit: 2020-08-10 | Discharge: 2020-08-10 | Disposition: A | Discharge status: Home / Self Care | Payer: Medicare Other | Source: Ambulatory Visit | Attending: Internal Medicine | Admitting: Internal Medicine

## 2020-08-10 DIAGNOSIS — G9389 Other specified disorders of brain: Secondary | ICD-10-CM

## 2020-08-10 DIAGNOSIS — J3489 Other specified disorders of nose and nasal sinuses: Secondary | ICD-10-CM | POA: Diagnosis not present

## 2020-08-10 DIAGNOSIS — R220 Localized swelling, mass and lump, head: Secondary | ICD-10-CM | POA: Diagnosis not present

## 2020-08-10 MED ORDER — GADOBENATE DIMEGLUMINE 529 MG/ML IV SOLN
15.0000 mL | Freq: Once | INTRAVENOUS | Status: AC | PRN
  Administered 2020-08-10: 15 mL via INTRAVENOUS

## 2020-08-13 ENCOUNTER — Inpatient Hospital Stay: Payer: Medicare Other | Attending: Internal Medicine

## 2020-08-13 DIAGNOSIS — I10 Essential (primary) hypertension: Secondary | ICD-10-CM | POA: Insufficient documentation

## 2020-08-13 DIAGNOSIS — C519 Malignant neoplasm of vulva, unspecified: Secondary | ICD-10-CM | POA: Insufficient documentation

## 2020-08-13 DIAGNOSIS — I739 Peripheral vascular disease, unspecified: Secondary | ICD-10-CM | POA: Insufficient documentation

## 2020-08-13 DIAGNOSIS — Z7901 Long term (current) use of anticoagulants: Secondary | ICD-10-CM | POA: Insufficient documentation

## 2020-08-13 DIAGNOSIS — D432 Neoplasm of uncertain behavior of brain, unspecified: Secondary | ICD-10-CM | POA: Insufficient documentation

## 2020-08-13 DIAGNOSIS — Z8673 Personal history of transient ischemic attack (TIA), and cerebral infarction without residual deficits: Secondary | ICD-10-CM | POA: Insufficient documentation

## 2020-08-13 DIAGNOSIS — Z79899 Other long term (current) drug therapy: Secondary | ICD-10-CM | POA: Insufficient documentation

## 2020-08-13 DIAGNOSIS — Z86718 Personal history of other venous thrombosis and embolism: Secondary | ICD-10-CM | POA: Insufficient documentation

## 2020-08-13 DIAGNOSIS — Z923 Personal history of irradiation: Secondary | ICD-10-CM | POA: Insufficient documentation

## 2020-08-13 DIAGNOSIS — K219 Gastro-esophageal reflux disease without esophagitis: Secondary | ICD-10-CM | POA: Insufficient documentation

## 2020-08-13 DIAGNOSIS — E559 Vitamin D deficiency, unspecified: Secondary | ICD-10-CM | POA: Insufficient documentation

## 2020-08-14 ENCOUNTER — Other Ambulatory Visit: Payer: Self-pay

## 2020-08-14 ENCOUNTER — Inpatient Hospital Stay: Payer: Medicare Other | Admitting: Internal Medicine

## 2020-08-14 VITALS — BP 141/67 | HR 56 | Temp 97.3°F | Resp 16 | Ht 61.0 in | Wt 170.8 lb

## 2020-08-14 DIAGNOSIS — Z79899 Other long term (current) drug therapy: Secondary | ICD-10-CM | POA: Diagnosis not present

## 2020-08-14 DIAGNOSIS — Z7901 Long term (current) use of anticoagulants: Secondary | ICD-10-CM | POA: Diagnosis not present

## 2020-08-14 DIAGNOSIS — G9389 Other specified disorders of brain: Secondary | ICD-10-CM

## 2020-08-14 DIAGNOSIS — C519 Malignant neoplasm of vulva, unspecified: Secondary | ICD-10-CM | POA: Diagnosis not present

## 2020-08-14 DIAGNOSIS — I739 Peripheral vascular disease, unspecified: Secondary | ICD-10-CM | POA: Diagnosis not present

## 2020-08-14 DIAGNOSIS — Z8673 Personal history of transient ischemic attack (TIA), and cerebral infarction without residual deficits: Secondary | ICD-10-CM | POA: Diagnosis not present

## 2020-08-14 DIAGNOSIS — K219 Gastro-esophageal reflux disease without esophagitis: Secondary | ICD-10-CM | POA: Diagnosis not present

## 2020-08-14 DIAGNOSIS — I10 Essential (primary) hypertension: Secondary | ICD-10-CM | POA: Diagnosis not present

## 2020-08-14 DIAGNOSIS — D432 Neoplasm of uncertain behavior of brain, unspecified: Secondary | ICD-10-CM | POA: Diagnosis not present

## 2020-08-14 DIAGNOSIS — Z86718 Personal history of other venous thrombosis and embolism: Secondary | ICD-10-CM | POA: Diagnosis not present

## 2020-08-14 DIAGNOSIS — E559 Vitamin D deficiency, unspecified: Secondary | ICD-10-CM | POA: Diagnosis not present

## 2020-08-14 DIAGNOSIS — Z923 Personal history of irradiation: Secondary | ICD-10-CM | POA: Diagnosis not present

## 2020-08-14 NOTE — Progress Notes (Signed)
[facility] [place], [place] [address] [phone]   Interval Evaluation  Date of Service: 08/14/20 Patient Name: Bonnie Zhang Patient MRN: [id number] Patient DOB: [birth year] Provider: Ventura Sellers, MD  Identifying Statement:  JONETTE WASSEL is a 80 y.o. female with left cavernous mass   Interval History: KELYSE PASK presents today for follow up after recent MRI brain.  She describes no new or progressive neurologic deficits.  No seizures or headaches.  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 Sondra Come.  H+P (08/12/19) Patient presents for evaluation and review of abnormal brain MRI findings.  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  Stroke symptoms were sudden onset of speech impairment which resolved on its own.  Through Dr. Zada Finders, repeat MRI was performed this month which showed no growth or change.  He did not recommend surgery or biopsy.  Medications: Current Outpatient Medications on File Prior to Visit  Medication Sig Dispense Refill  . acetaminophen (TYLENOL) 500 MG tablet Take 500 mg by mouth every 6 (six) hours as needed.    Marland Kitchen amLODipine (NORVASC) 5 MG tablet TAKE 1 TABLET BY MOUTH DAILY. TAKE AN EXTRA TABLET IF BLOOD PRESSURE IS GREATER THAN 145/90 100 tablet 1  . benazepril (LOTENSIN) 40 MG tablet TAKE 1 TABLET BY MOUTH DAILY. 90 tablet 1  . metoprolol succinate (TOPROL-XL) 50 MG 24 hr tablet TAKE 1 TABLET BY MOUTH DAILY. TAKE WITH OR IMMEDIATELY FOLLOWING A MEAL. 90 tablet 1  . pantoprazole (PROTONIX) 40 MG tablet TAKE 1 TABLET BY MOUTH ONCE DAILY AS NEEDED FOR ACID REFLUX 90 tablet 0  . simvastatin (ZOCOR) 10 MG tablet TAKE 1 TABLET BY MOUTH AT BEDTIME. 90 tablet 1  . spironolactone (ALDACTONE) 25 MG tablet TAKE 1/2 TABLET BY MOUTH DAILY. 45 tablet 1  . vitamin B-12 (CYANOCOBALAMIN) 1000 MCG tablet  Take 1,000 mcg by mouth daily.    Marland Kitchen warfarin (COUMADIN) 5 MG tablet Take 1/2 tablet daily except take 1 tablet on Mon and Thurs or take as directed by anticoagulation clinic 90 tablet 1   No current facility-administered medications on file prior to visit.    Allergies:  Allergies  Allergen Reactions  . Vicodin [Hydrocodone-Acetaminophen]     Nightmares  . Aspirin Other (See Comments)    upset stomach  . Atorvastatin Other (See Comments)    leg pain  . Hctz [Hydrochlorothiazide] Other (See Comments)    FATIGUE AND EXTREMELY LOW POTASSIUM   Past Medical History:  Past Medical History:  Diagnosis Date  . Anticoagulated on Coumadin 2001   2 blood clots  . Bruises easily    pt is on Coumadin;last dose of Coumadin 08/05/11 and then Lovenox started 08/07/11  . DVT (deep vein thrombosis) in pregnancy     1965 post C section;2001 with prolonged driving while on  HRT  . DVT (deep venous thrombosis) ([place]) 2001   Was on Prempro.    . 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 GERD (gastroesophageal reflux disease)    Protonix prn  . High cholesterol    takes Simvastating daily  . History of colon polyps 2007   Dr Sharlett Iles  . History of radiation therapy 09/06/2019-11/23/2019   IMRT Vagina/Pelvis and Vagina HDR 4 fx; Dr. Gery Pray  . 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 Hx of colonic polyps    Dr Sharlett Iles  . Hx of  migraines    yrs ago   . Hypertension    takes Benazepril,Amlodipine,and Metoprolol daily  . Osteoporosis   . PONV (postoperative nausea and vomiting)   . Primary localized osteoarthritis of left knee   . PVD (peripheral vascular disease) ([place]) 1965, 2001   abnormal venous doppler findings due to recurrent DVT'S left leg  . Right knee DJD    knees  . Shortness of breath dyspnea   . Vitamin B12 deficiency (non anemic) 01/24/2015  . Vitamin D deficiency    takes VIt D daily   Past Surgical History:  Past Surgical History:  Procedure Laterality Date  . ABDOMINAL  HYSTERECTOMY    . APPENDECTOMY  1965  . BREAST LUMPECTOMY  1987  . CESAREAN SECTION  1961/1965/1966   X 3  . CHOLECYSTECTOMY  1997  . COLONOSCOPY W/ POLYPECTOMY  2007   Dr  Sharlett Iles; due? 2012  . DILATION AND CURETTAGE OF UTERUS  2012   uterine polyp, Dr Kennon Rounds  . ESOPHAGOGASTRODUODENOSCOPY  1997  . HYSTEROSCOPY WITH D & C N/A 08/05/2012   Procedure: DILATATION AND CURETTAGE /HYSTEROSCOPY;  Surgeon: Donnamae Jude, MD;  Location: [facility];  Service: Gynecology;  Laterality: N/A;  . ROBOTIC ASSISTED TOTAL HYSTERECTOMY WITH BILATERAL SALPINGO OOPHERECTOMY Right 09/14/2012   Procedure: ROBOTIC ASSISTED TOTAL HYSTERECTOMY WITH BILATERAL SALPINGO OOPHORECTOMY ,right pelvic LYMPHNODE dissection;  Surgeon: Imagene Gurney A. Alycia Rossetti, MD;  Location: [facility];  Service: Gynecology;  Laterality: Right;  . TOTAL KNEE ARTHROPLASTY  08/11/2011   Procedure: TOTAL KNEE ARTHROPLASTY;  Surgeon: Lorn Junes, MD;  Location: [facility];  Service: Orthopedics;  Laterality: Right;  DR Roseto THIS CASE  . TOTAL KNEE ARTHROPLASTY Left 09/11/2014  . TOTAL KNEE ARTHROPLASTY Left 09/11/2014   Procedure: TOTAL KNEE ARTHROPLASTY;  Surgeon: Elsie Saas, MD;  Location: [facility];  Service: Orthopedics;  Laterality: Left;  . WISDOM TOOTH EXTRACTION     Social History:  Social History   Socioeconomic History  . Marital status: Divorced    Spouse name: Not on file  . Number of children: 2  . Years of education: Not on file  . Highest education level: High school graduate  Occupational History  . Occupation: Retired  Tobacco Use  . Smoking status: Never Smoker  . Smokeless tobacco: Never Used  Vaping Use  . Vaping Use: Never used  Substance and Sexual Activity  . Alcohol use: No  . Drug use: No  . Sexual activity: Not Currently    Birth control/protection: Post-menopausal    Comment: retired. has 2 daughters  Other Topics Concern  . Not on file  Social History Narrative   Pt lives alone she has 2 daughter -  right handed- drinks tea, soda sometimes - No regular exercise   Social Determinants of Health   Financial Resource Strain: Low Risk   . Difficulty of Paying Living Expenses: Not hard at all  Food Insecurity: No Food Insecurity  . Worried About Charity fundraiser in the Last Year: Never true  . Ran Out of Food in the Last Year: Never true  Transportation Needs: Not on file  Physical Activity: Sufficiently Active  . Days of Exercise per Week: 5 days  . Minutes of Exercise per Session: 30 min  Stress: No Stress Concern Present  . Feeling of Stress : Not at all  Social Connections: Moderately Integrated  . Frequency of Communication with Friends and Family: More than three times a week  . Frequency  of Social Gatherings with Friends and Family: More than three times a week  . Attends Religious Services: More than 4 times per year  . Active Member of Clubs or Organizations: Yes  . Attends Archivist Meetings: More than 4 times per year  . Marital Status: Widowed  Intimate Partner Violence: Not on file   Family History:  Family History  Problem Relation Age of Onset  . Kidney disease Mother        ? hypertensive  . Hypertension Mother   . Deep vein thrombosis Mother        post ankle fracture  . Leukemia Father   . Cancer Father   . Colon cancer Maternal Grandmother   . Diabetes Other        cousins  . Hypertension Sister   . Anesthesia problems Neg Hx   . Hypotension Neg Hx   . Malignant hyperthermia Neg Hx   . Pseudochol deficiency Neg Hx   . Stroke Neg Hx   . Heart disease Neg Hx   . Breast cancer Neg Hx   . Ovarian cancer Neg Hx   . Uterine cancer Neg Hx     Review of Systems: Constitutional: Doesn't report fevers, chills or abnormal weight loss Eyes: Doesn't report blurriness of vision Ears, nose, mouth, throat, and face: Doesn't report sore throat Respiratory: Doesn't report cough, dyspnea or wheezes Cardiovascular: Doesn't report palpitation, chest  discomfort  Gastrointestinal:  Doesn't report nausea, constipation, diarrhea GU: Doesn't report incontinence Skin: Doesn't report skin rashes Neurological: Per HPI Musculoskeletal: Doesn't report joint pain Behavioral/Psych: Doesn't report anxiety  Physical Exam: Vitals:   08/14/20 1010  BP: (!) 141/67  Pulse: (!) 56  Resp: 16  Temp: (!) 97.3 F (36.3 C)  SpO2: 99%   KPS: 80. General: Alert, cooperative, pleasant, in no acute distress Head: Normal EENT: No conjunctival injection or scleral icterus.  Lungs: Resp effort normal Cardiac: Regular rate Abdomen: Non-distended abdomen Skin: No rashes cyanosis or petechiae. Extremities: No clubbing or edema  Neurologic Exam: Mental Status: Awake, alert, attentive to examiner. Oriented to self and environment. Language is fluent with intact comprehension.  Cranial Nerves: Visual acuity is grossly normal. Visual fields are full. Extra-ocular movements intact. No ptosis. Face is symmetric Motor: Tone and bulk are normal. Power is full in both arms and legs. Reflexes are symmetric, no pathologic reflexes present.  Sensory: Intact to light touch Gait: Normal.   Labs: I have reviewed the data as listed    Component Value Date/Time   NA 141 03/08/2020 0903   K 3.9 03/08/2020 0903   CL 104 03/08/2020 0903   CO2 31 03/08/2020 0903   GLUCOSE 95 03/08/2020 0903   BUN 15 03/08/2020 0903   CREATININE 0.92 03/08/2020 0903   CALCIUM 9.7 03/08/2020 0903   PROT 7.5 03/08/2020 0903   ALBUMIN 4.1 03/08/2020 0903   AST 13 03/08/2020 0903   ALT 7 03/08/2020 0903   ALKPHOS 79 03/08/2020 0903   BILITOT 0.7 03/08/2020 0903   GFRNONAA 56 (L) 08/18/2019 1001   GFRAA >60 08/18/2019 1001   Lab Results  Component Value Date   WBC 3.2 (L) 03/08/2020   NEUTROABS 2.0 03/08/2020   HGB 12.8 03/08/2020   HCT 38.4 03/08/2020   MCV 89.8 03/08/2020   PLT 169.0 03/08/2020   Imaging:  [place] Clinician Interpretation: I have personally reviewed the  CNS images as listed.  My interpretation, in the context of the patient's clinical presentation, is stable disease  MR BRAIN W WO CONTRAST  Result Date: 08/10/2020 CLINICAL DATA:  Follow-up right cavernous sinus mass. History of endometrial cancer. EXAM: MRI HEAD WITHOUT AND WITH CONTRAST TECHNIQUE: Multiplanar, multiecho pulse sequences of the brain and surrounding structures were obtained without and with intravenous contrast. CONTRAST:  58mL MULTIHANCE GADOBENATE DIMEGLUMINE 529 MG/ML IV SOLN COMPARISON:  08/04/2019 and 01/23/2019 FINDINGS: Brain: There is no evidence of an acute infarct, intracranial hemorrhage, midline shift, or extra-axial fluid collection. No significant white matter disease is seen for age. Mild cerebral atrophy is within normal limits for age. A homogeneously T2 hyperintense, avidly enhancing mass in the right cavernous sinus has not significantly changed in size from 2020 measuring 1.7 x 0.9 x 1.1 cm (AP x transverse x craniocaudal). This partially encases the right cavernous ICA with which has a preserved flow void and is not grossly narrowed. The mass abuts but does not appear to arise from the pituitary gland. No second enhancing intracranial lesion is present. Vascular: Major intracranial vascular flow voids are preserved. Skull and upper cervical spine: Unremarkable bone marrow signal. Sinuses/Orbits: Paranasal sinuses and mastoid air cells are clear. Unremarkable orbits. Other: None. IMPRESSION: 1. Unchanged right cavernous sinus mass. This remains indeterminate, however the signal characteristics would be compatible with a hemangioma. Other considerations include neoplasm such as meningioma or schwannoma and non-neoplastic etiologies such as pseudotumor and granulomatous processes. Malignancy (including lymphoma and metastatic disease) are unlikely given stability from 2020. 2. Otherwise unremarkable appearance of the brain for age. Electronically Signed   By: Logan Bores M.D.    On: 08/10/2020 14:40    Assessment/Plan Cavernous Sinus Mass  Jonia Turowski Wiedemann is clinically and radiographically stable.  Etiology of mass is unclear, but it is not definitively neoplastic.  We recommended she return or repeat CNS imaging only with neurologic symptoms.  Will continue to follow with PCP and Dr. Tomi Likens for management of Factor V, HTN and TIA.  All questions were answered. The patient knows to call the clinic with any problems, questions or concerns. No barriers to learning were detected.  The total time spent in the encounter was 30 minutes and more than 50% was on counseling and review of test results   Ventura Sellers, MD Medical Director of Neuro-Oncology [facility] [place] 08/14/20 1:24 PM

## 2020-08-24 ENCOUNTER — Encounter: Payer: Self-pay | Admitting: Gynecologic Oncology

## 2020-08-28 ENCOUNTER — Other Ambulatory Visit: Payer: Self-pay | Admitting: Internal Medicine

## 2020-08-28 ENCOUNTER — Ambulatory Visit: Payer: Medicare Other | Admitting: Gynecologic Oncology

## 2020-08-30 ENCOUNTER — Inpatient Hospital Stay: Payer: Medicare Other | Attending: Internal Medicine | Admitting: Gynecologic Oncology

## 2020-08-30 ENCOUNTER — Other Ambulatory Visit: Payer: Self-pay

## 2020-08-30 ENCOUNTER — Encounter: Payer: Self-pay | Admitting: Gynecologic Oncology

## 2020-08-30 VITALS — BP 143/63 | HR 60 | Temp 98.2°F | Resp 12 | Wt 171.7 lb

## 2020-08-30 DIAGNOSIS — Z79899 Other long term (current) drug therapy: Secondary | ICD-10-CM | POA: Insufficient documentation

## 2020-08-30 DIAGNOSIS — Z7901 Long term (current) use of anticoagulants: Secondary | ICD-10-CM | POA: Insufficient documentation

## 2020-08-30 DIAGNOSIS — C541 Malignant neoplasm of endometrium: Secondary | ICD-10-CM

## 2020-08-30 DIAGNOSIS — D432 Neoplasm of uncertain behavior of brain, unspecified: Secondary | ICD-10-CM | POA: Diagnosis not present

## 2020-08-30 DIAGNOSIS — Z923 Personal history of irradiation: Secondary | ICD-10-CM | POA: Diagnosis not present

## 2020-08-30 DIAGNOSIS — Z86718 Personal history of other venous thrombosis and embolism: Secondary | ICD-10-CM | POA: Diagnosis not present

## 2020-08-30 DIAGNOSIS — Z90722 Acquired absence of ovaries, bilateral: Secondary | ICD-10-CM | POA: Insufficient documentation

## 2020-08-30 DIAGNOSIS — I10 Essential (primary) hypertension: Secondary | ICD-10-CM | POA: Insufficient documentation

## 2020-08-30 DIAGNOSIS — Z90710 Acquired absence of both cervix and uterus: Secondary | ICD-10-CM | POA: Diagnosis not present

## 2020-08-30 DIAGNOSIS — C519 Malignant neoplasm of vulva, unspecified: Secondary | ICD-10-CM | POA: Insufficient documentation

## 2020-08-30 DIAGNOSIS — E7800 Pure hypercholesterolemia, unspecified: Secondary | ICD-10-CM | POA: Diagnosis not present

## 2020-08-30 NOTE — Progress Notes (Signed)
Gynecologic Oncology Return Clinic Visit  08/30/20  Reason for Visit: surveillance visit in the setting of endometrial cancer  Treatment History: Oncology History Overview Note  MMR IHC intact ER/PR strongly positive   Endometrial cancer ([place])  08/09/2019 Initial Biopsy   Vaginal biopsy: Endometrioid adenoca, FIGO gr 2   08/19/2019 Initial Diagnosis   Endometrial cancer ([place])   09/06/2019 - 11/23/2019 Radiation Therapy   Site Technique Total Dose (Gy) Dose per Fx (Gy) Completed Fx Beam Energies  Vagina: Pelvis_Bst HDR-brachy 24/24 6 4/4 Ir-192  Vagina: Pelvis IMRT 45/45 1.8 25/25 6X       2012: Endometrial sampling showed benign endometrium, polyps 08/05/2012: D&C and hysteroscopy, pathology revealed at least atypical complex endometrial hyperplasia with a focal area worrisome for well differentiated endometrioid carcinoma 09/14/2012: Total robotic hysterectomy, BSO, right pelvic lymph node dissection. Frozen section with no evidence of cancer, possible hyperplasia. Final pathology revealed extensive atypical complex hyperplasia. 4 right lymph nodes negative for malignancy. 08/09/2019: Patient presented to her OB/GYN with an episode of pink discharge followed by frank bleeding after using a douche. Since that time has had continued brown discharge. 08/09/2019: Vaginal biopsy shows endometrioid adenocarcinoma  Interval History: Patient presents today for surveillance visit.  She reports overall doing well.  She has any vaginal bleeding or discharge.  She has been using her dilator about every other night.  She continues to have increased urination at night, which is unchanged for her.  She notes that her bowel function is more or less back to normal although different than her baseline.  She denies any significant change in weight.  She reports a good appetite without nausea or emesis.  Last saw Dr. Sondra Come in February. Was doing well at that time and was NED.  Saw Dr. Mickeal Skinner on 5/17 for  follow-up of brain mass noted on MRI in 2020 in the cavernous sinus. At that time, neither surgery nor biopsy was recommended. As she is asymptomatic and mass is stable in size, continued surveillance has been recommended.   Past Medical/Surgical History: Past Medical History:  Diagnosis Date  . Anticoagulated on Coumadin 2001   2 blood clots  . Bruises easily    pt is on Coumadin;last dose of Coumadin 08/05/11 and then Lovenox started 08/07/11  . DVT (deep vein thrombosis) in pregnancy     1965 post C section;2001 with prolonged driving while on  HRT  . DVT (deep venous thrombosis) ([place]) 2001   Was on Prempro.    . 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 GERD (gastroesophageal reflux disease)    Protonix prn  . High cholesterol    takes Simvastating daily  . History of colon polyps 2007   Dr Sharlett Iles  . History of radiation therapy 09/06/2019-11/23/2019   IMRT Vagina/Pelvis and Vagina HDR 4 fx; Dr. Gery Pray  . 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 Hx of colonic polyps    Dr Sharlett Iles  . Hx of migraines    yrs ago   . Hypertension    takes Benazepril,Amlodipine,and Metoprolol daily  . Osteoporosis   . PONV (postoperative nausea and vomiting)   . Primary localized osteoarthritis of left knee   . PVD (peripheral vascular disease) ([place]) 1965, 2001   abnormal venous doppler findings due to recurrent DVT'S left leg  . Right knee DJD    knees  . Shortness of breath dyspnea   . Vitamin B12 deficiency (non anemic) 01/24/2015  . Vitamin D deficiency    takes VIt D daily  Past Surgical History:  Procedure Laterality Date  . ABDOMINAL HYSTERECTOMY    . APPENDECTOMY  1965  . BREAST LUMPECTOMY  1987  . CESAREAN SECTION  1961/1965/1966   X 3  . CHOLECYSTECTOMY  1997  . COLONOSCOPY W/ POLYPECTOMY  2007   Dr  Sharlett Iles; due? 2012  . DILATION AND CURETTAGE OF UTERUS  2012   uterine polyp, Dr Kennon Rounds  . ESOPHAGOGASTRODUODENOSCOPY  1997  . HYSTEROSCOPY WITH D & C N/A 08/05/2012    Procedure: DILATATION AND CURETTAGE /HYSTEROSCOPY;  Surgeon: Donnamae Jude, MD;  Location: [facility];  Service: Gynecology;  Laterality: N/A;  . ROBOTIC ASSISTED TOTAL HYSTERECTOMY WITH BILATERAL SALPINGO OOPHERECTOMY Right 09/14/2012   Procedure: ROBOTIC ASSISTED TOTAL HYSTERECTOMY WITH BILATERAL SALPINGO OOPHORECTOMY ,right pelvic LYMPHNODE dissection;  Surgeon: Imagene Gurney A. Alycia Rossetti, MD;  Location: [facility];  Service: Gynecology;  Laterality: Right;  . TOTAL KNEE ARTHROPLASTY  08/11/2011   Procedure: TOTAL KNEE ARTHROPLASTY;  Surgeon: Lorn Junes, MD;  Location: [facility];  Service: Orthopedics;  Laterality: Right;  DR [place] THIS CASE  . TOTAL KNEE ARTHROPLASTY Left 09/11/2014  . TOTAL KNEE ARTHROPLASTY Left 09/11/2014   Procedure: TOTAL KNEE ARTHROPLASTY;  Surgeon: Elsie Saas, MD;  Location: [facility];  Service: Orthopedics;  Laterality: Left;  . WISDOM TOOTH EXTRACTION      Family History  Problem Relation Age of Onset  . Kidney disease Mother        ? hypertensive  . Hypertension Mother   . Deep vein thrombosis Mother        post ankle fracture  . Leukemia Father   . Cancer Father   . Colon cancer Maternal Grandmother   . Diabetes Other        cousins  . Hypertension Sister   . Anesthesia problems Neg Hx   . Hypotension Neg Hx   . Malignant hyperthermia Neg Hx   . Pseudochol deficiency Neg Hx   . Stroke Neg Hx   . Heart disease Neg Hx   . Breast cancer Neg Hx   . Ovarian cancer Neg Hx   . Uterine cancer Neg Hx     Social History   Socioeconomic History  . Marital status: Divorced    Spouse name: Not on file  . Number of children: 2  . Years of education: Not on file  . Highest education level: High school graduate  Occupational History  . Occupation: Retired  Tobacco Use  . Smoking status: Never Smoker  . Smokeless tobacco: Never Used  Vaping Use  . Vaping Use: Never used  Substance and Sexual Activity  . Alcohol use: No  . Drug use: No  . Sexual  activity: Not Currently    Birth control/protection: Post-menopausal    Comment: retired. has 2 daughters  Other Topics Concern  . Not on file  Social History Narrative   Pt lives alone she has 2 daughter - right handed- drinks tea, soda sometimes - No regular exercise   Social Determinants of Health   Financial Resource Strain: Low Risk   . Difficulty of Paying Living Expenses: Not hard at all  Food Insecurity: No Food Insecurity  . Worried About Charity fundraiser in the Last Year: Never true  . Ran Out of Food in the Last Year: Never true  Transportation Needs: Not on file  Physical Activity: Sufficiently Active  . Days of Exercise per Week: 5 days  . Minutes of Exercise per Session: 30 min  Stress: No Stress Concern Present  . Feeling of Stress : Not at all  Social Connections: Moderately Integrated  . Frequency of Communication with Friends and Family: More than three times a week  . Frequency of Social Gatherings with Friends and Family: More than three times a week  . Attends Religious Services: More than 4 times per year  . Active Member of Clubs or Organizations: Yes  . Attends Archivist Meetings: More than 4 times per year  . Marital Status: Widowed    Current Medications:  Current Outpatient Medications:  .  acetaminophen (TYLENOL) 500 MG tablet, Take 500 mg by mouth every 6 (six) hours as needed., Disp: , Rfl:  .  amLODipine (NORVASC) 5 MG tablet, TAKE 1 TABLET BY MOUTH DAILY. TAKE AN EXTRA TABLET IF BLOOD PRESSURE IS GREATER THAN 145/90, Disp: 100 tablet, Rfl: 1 .  benazepril (LOTENSIN) 40 MG tablet, TAKE 1 TABLET BY MOUTH DAILY., Disp: 90 tablet, Rfl: 1 .  metoprolol succinate (TOPROL-XL) 50 MG 24 hr tablet, TAKE 1 TABLET BY MOUTH DAILY. TAKE WITH OR IMMEDIATELY FOLLOWING A MEAL., Disp: 90 tablet, Rfl: 1 .  pantoprazole (PROTONIX) 40 MG tablet, TAKE 1 TABLET BY MOUTH ONCE DAILY AS NEEDED FOR ACID REFLUX, Disp: 90 tablet, Rfl: 0 .  simvastatin (ZOCOR) 10  MG tablet, TAKE 1 TABLET BY MOUTH AT BEDTIME., Disp: 90 tablet, Rfl: 1 .  vitamin B-12 (CYANOCOBALAMIN) 1000 MCG tablet, Take 1,000 mcg by mouth daily., Disp: , Rfl:  .  warfarin (COUMADIN) 5 MG tablet, Take 1/2 tablet daily except take 1 tablet on Mon and Thurs or take as directed by anticoagulation clinic, Disp: 90 tablet, Rfl: 1 .  spironolactone (ALDACTONE) 25 MG tablet, TAKE 1/2 TABLET BY MOUTH DAILY. DUE FOR OFFICE VISIT, Disp: 45 tablet, Rfl: 1  Review of Systems: Denies appetite changes, fevers, chills, fatigue, unexplained weight changes. Denies hearing loss, neck lumps or masses, mouth sores, ringing in ears or voice changes. Denies cough or wheezing.  Denies shortness of breath. Denies chest pain or palpitations. Denies leg swelling. Denies abdominal distention, pain, blood in stools, constipation, diarrhea, nausea, vomiting, or early satiety. Denies pain with intercourse, dysuria, frequency, hematuria or incontinence. Denies hot flashes, pelvic pain, vaginal bleeding or vaginal discharge.   Denies joint pain, back pain or muscle pain/cramps. Denies itching, rash, or wounds. Denies dizziness, headaches, numbness or seizures. Denies swollen lymph nodes or glands, denies easy bruising or bleeding. Denies anxiety, depression, confusion, or decreased concentration.  Physical Exam: BP (!) 143/63 (BP Location: Right Arm, Patient Position: Sitting)   Pulse 60   Temp 98.2 F (36.8 C) (Oral)   Resp 12   Wt 171 lb 11.2 oz (77.9 kg)   SpO2 97%   BMI 32.44 kg/m  General: Alert, oriented, no acute distress. HEENT: Normocephalic, atraumatic, sclera anicteric. Chest: Clear to auscultation bilaterally.  Unlabored breathing on room air. Abdomen: Obese, soft, nontender.  Normoactive bowel sounds.  No masses or hepatosplenomegaly appreciated.  Well-healed scar. Extremities: Grossly normal range of motion.  Warm, well perfused.  No edema bilaterally. Skin: No rashes or lesions  noted. Lymphatics: No cervical, supraclavicular, or inguinal adenopathy. GU: Normal appearing external genitalia without erythema, excoriation, or lesions.  Several small, hyperpigmented lesions on her vulva that appear to be hemangiomas.  Speculum exam reveals atrophic vaginal mucosa, especially at the apex, consistent with radiation changes.  No masses or lesions.  No bleeding or discharge.  Pediatric speculum necessary to staining apex of the vagina.  Some vascular congestion noted along the posterior aspect of her vagina at the introitus.  Bimanual exam reveals some narrowing of the apex of the vagina secondary to radiation, no masses or nodularity.  Rectovaginal exam confirms findings.  Laboratory & Radiologic Studies: None new  Assessment & Plan: Bonnie Zhang is a 80 y.o. woman with grade 2 endometrioid adenocarcinoma presenting at the vaginal cuff after prior hysterectomy for complex atypical hyperplasia who is just under a year out from definitive treatment with radiation.    The patient continues to do very well.  She is NED on exam today.  We reviewed again that given the narrowing at the top of the vagina, continued dilator use is very important.  She has been doing this every other day. We reviewed signs and symptoms that would be concerning for disease recurrence.  Per NCCN surveillance recommendations, I recommended surveillance visits every 3 months for the first 2-3 years and then every 6 months until 5 years.  We will alternate visits between my clinic and radiation oncology.  She will see Dr. Sondra Come in the fall and present back for a visit with me at the end of the year. She knows to call the clinic if she develops any concerning symptoms before her next scheduled visit.  32 minutes of total time was spent for this patient encounter, including preparation, face-to-face counseling with the patient and coordination of care, and documentation of the encounter.  Jeral Pinch, MD  Division of Gynecologic Oncology  Department of Obstetrics and Gynecology  [facility]

## 2020-08-30 NOTE — Patient Instructions (Signed)
Great to see you today!  I do not see or feel any evidence of cancer recurrence on your exam.  I will see you back in 6 months.  If you develop any of the symptoms that we talked about today, such as vaginal bleeding or pelvic pain, please call to see me sooner.

## 2020-09-02 ENCOUNTER — Encounter: Payer: Self-pay | Admitting: Internal Medicine

## 2020-09-02 NOTE — Patient Instructions (Addendum)
    Blood work was ordered.      Medications changes include :   none     Please followup in 6 months  

## 2020-09-02 NOTE — Progress Notes (Signed)
Subjective:    Patient ID: Bonnie Zhang, female    DOB: 25-Nov-1940, 80 y.o.   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    Neuro- onc advised brain MRI only for symptoms given stability of brain mass.   Overall doing well.  Medications and allergies reviewed with patient and updated if appropriate.  Patient Active Problem List   Diagnosis Date Noted  . Endometrial cancer ([place]) 08/19/2019  . Candidiasis 08/19/2019  . Brain mass 08/12/2019  . TIA (transient ischemic attack) 01/03/2019  . Hyperglycemia 06/29/2017  . Vertigo 04/07/2017  . Long term (current) use of anticoagulants 01/07/2017  . Carotid artery disease ([place]) 02/01/2016  . Bilateral leg edema 06/21/2015  . Vitamin B12 deficiency (non anemic) 01/24/2015  . 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 Primary localized osteoarthritis of left knee   . Encounter for therapeutic drug monitoring 06/07/2013  . GERD (gastroesophageal reflux disease)   . PVD (peripheral vascular disease) ([place])   . 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 10/09/2010  . Osteoporosis 03/21/2010  . History of colonic polyps 03/20/2008  . Vitamin D deficiency 08/25/2007  . HYPERLIPIDEMIA 03/18/2007  . Dysmetabolic syndrome X 40/98/1191  . Essential hypertension 03/18/2007  . DVT, HX OF 07/23/2006    Current Outpatient Medications on File Prior to Visit  Medication Sig Dispense Refill  . acetaminophen (TYLENOL) 500 MG tablet Take 500 mg by mouth every 6 (six) hours as needed.    Marland Kitchen amLODipine (NORVASC) 5 MG tablet TAKE 1 TABLET BY MOUTH DAILY. TAKE AN EXTRA TABLET IF BLOOD PRESSURE IS GREATER THAN 145/90 100 tablet 1  . benazepril (LOTENSIN) 40 MG tablet TAKE  1 TABLET BY MOUTH DAILY. 90 tablet 1  . cholecalciferol (VITAMIN D3) 25 MCG (1000 UNIT) tablet Take 1,000 Units by mouth daily.    . metoprolol succinate (TOPROL-XL) 50 MG 24 hr tablet TAKE 1 TABLET BY MOUTH DAILY. TAKE WITH OR IMMEDIATELY FOLLOWING A MEAL. 90 tablet 1  . pantoprazole (PROTONIX) 40 MG tablet TAKE 1 TABLET BY MOUTH ONCE DAILY AS NEEDED FOR ACID REFLUX 90 tablet 0  . simvastatin (ZOCOR) 10 MG tablet TAKE 1 TABLET BY MOUTH AT BEDTIME. 90 tablet 1  . spironolactone (ALDACTONE) 25 MG tablet TAKE 1/2 TABLET BY MOUTH DAILY. DUE FOR OFFICE VISIT 45 tablet 1  . vitamin B-12 (CYANOCOBALAMIN) 1000 MCG tablet Take 1,000 mcg by mouth daily.    Marland Kitchen warfarin (COUMADIN) 5 MG tablet Take 1/2 tablet daily except take 1 tablet on Mon and Thurs or take as directed by anticoagulation clinic 90 tablet 1   No current facility-administered medications on file prior to visit.    Past Medical History:  Diagnosis Date  . Anticoagulated on Coumadin 2001   2 blood clots  . Bruises easily    pt is on Coumadin;last dose of Coumadin 08/05/11 and then Lovenox started 08/07/11  . DVT (deep vein thrombosis) in pregnancy     1965 post C section;2001 with prolonged driving while on  HRT  . DVT (deep venous thrombosis) ([place]) 2001   Was on Prempro.    . 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 GERD (gastroesophageal reflux disease)    Protonix prn  . High  cholesterol    takes Simvastating daily  . History of colon polyps 2007   Dr Sharlett Iles  . History of radiation therapy 09/06/2019-11/23/2019   IMRT Vagina/Pelvis and Vagina HDR 4 fx; Dr. Gery Pray  . 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 Hx of colonic polyps    Dr Sharlett Iles  . Hx of migraines    yrs ago   . Hypertension    takes Benazepril,Amlodipine,and Metoprolol daily  . Osteoporosis   . PONV (postoperative nausea and vomiting)   . Primary localized osteoarthritis of left knee   . PVD (peripheral vascular disease) ([place]) 1965, 2001   abnormal  venous doppler findings due to recurrent DVT'S left leg  . Right knee DJD    knees  . Shortness of breath dyspnea   . Vitamin B12 deficiency (non anemic) 01/24/2015  . Vitamin D deficiency    takes VIt D daily    Past Surgical History:  Procedure Laterality Date  . ABDOMINAL HYSTERECTOMY    . APPENDECTOMY  1965  . BREAST LUMPECTOMY  1987  . CESAREAN SECTION  1961/1965/1966   X 3  . CHOLECYSTECTOMY  1997  . COLONOSCOPY W/ POLYPECTOMY  2007   Dr  Sharlett Iles; due? 2012  . DILATION AND CURETTAGE OF UTERUS  2012   uterine polyp, Dr Kennon Rounds  . ESOPHAGOGASTRODUODENOSCOPY  1997  . HYSTEROSCOPY WITH D & C N/A 08/05/2012   Procedure: DILATATION AND CURETTAGE /HYSTEROSCOPY;  Surgeon: Donnamae Jude, MD;  Location: [facility];  Service: Gynecology;  Laterality: N/A;  . ROBOTIC ASSISTED TOTAL HYSTERECTOMY WITH BILATERAL SALPINGO OOPHERECTOMY Right 09/14/2012   Procedure: ROBOTIC ASSISTED TOTAL HYSTERECTOMY WITH BILATERAL SALPINGO OOPHORECTOMY ,right pelvic LYMPHNODE dissection;  Surgeon: Imagene Gurney A. Alycia Rossetti, MD;  Location: [facility];  Service: Gynecology;  Laterality: Right;  . TOTAL KNEE ARTHROPLASTY  08/11/2011   Procedure: TOTAL KNEE ARTHROPLASTY;  Surgeon: Lorn Junes, MD;  Location: [facility];  Service: Orthopedics;  Laterality: Right;  DR [place] THIS CASE  . TOTAL KNEE ARTHROPLASTY Left 09/11/2014  . TOTAL KNEE ARTHROPLASTY Left 09/11/2014   Procedure: TOTAL KNEE ARTHROPLASTY;  Surgeon: Elsie Saas, MD;  Location: [facility];  Service: Orthopedics;  Laterality: Left;  . WISDOM TOOTH EXTRACTION      Social History   Socioeconomic History  . Marital status: Divorced    Spouse name: Not on file  . Number of children: 2  . Years of education: Not on file  . Highest education level: High school graduate  Occupational History  . Occupation: Retired  Tobacco Use  . Smoking status: Never Smoker  . Smokeless tobacco: Never Used  Vaping Use  . Vaping Use: Never used  Substance and  Sexual Activity  . Alcohol use: No  . Drug use: No  . Sexual activity: Not Currently    Birth control/protection: Post-menopausal    Comment: retired. has 2 daughters  Other Topics Concern  . Not on file  Social History Narrative   Pt lives alone she has 2 daughter - right handed- drinks tea, soda sometimes - No regular exercise   Social Determinants of Health   Financial Resource Strain: Low Risk   . Difficulty of Paying Living Expenses: Not hard at all  Food Insecurity: No Food Insecurity  . Worried About Charity fundraiser in the Last Year: Never true  . Ran Out of Food in the Last Year: Never true  Transportation Needs: Not on file  Physical Activity: Sufficiently Active  . Days of Exercise  per Week: 5 days  . Minutes of Exercise per Session: 30 min  Stress: No Stress Concern Present  . Feeling of Stress : Not at all  Social Connections: Moderately Integrated  . Frequency of Communication with Friends and Family: More than three times a week  . Frequency of Social Gatherings with Friends and Family: More than three times a week  . Attends Religious Services: More than 4 times per year  . Active Member of Clubs or Organizations: Yes  . Attends Archivist Meetings: More than 4 times per year  . Marital Status: Widowed    Family History  Problem Relation Age of Onset  . Kidney disease Mother        ? hypertensive  . Hypertension Mother   . Deep vein thrombosis Mother        post ankle fracture  . Leukemia Father   . Cancer Father   . Colon cancer Maternal Grandmother   . Diabetes Other        cousins  . Hypertension Sister   . Anesthesia problems Neg Hx   . Hypotension Neg Hx   . Malignant hyperthermia Neg Hx   . Pseudochol deficiency Neg Hx   . Stroke Neg Hx   . Heart disease Neg Hx   . Breast cancer Neg Hx   . Ovarian cancer Neg Hx   . Uterine cancer Neg Hx     Review of Systems  Constitutional: Negative for chills and fever.  Respiratory:  Negative for cough, shortness of breath and wheezing.   Cardiovascular: Positive for leg swelling (occ, mild). Negative for chest pain and palpitations.  Genitourinary: Positive for dysuria and frequency.  Neurological: Negative for light-headedness and headaches.       Objective:   Vitals:   09/03/20 1011  BP: 130/68  Pulse: 66  Temp: 98.3 F (36.8 C)  SpO2: 98%   BP Readings from Last 3 Encounters:  09/03/20 130/68  08/30/20 (!) 143/63  08/14/20 (!) 141/67   Wt Readings from Last 3 Encounters:  09/03/20 172 lb (78 kg)  08/30/20 171 lb 11.2 oz (77.9 kg)  08/14/20 170 lb 12.8 oz (77.5 kg)   Body mass index is 32.5 kg/m.   Physical Exam    Constitutional: Appears well-developed and well-nourished. No distress.  HENT:  Head: Normocephalic and atraumatic.  Neck: Neck supple. No tracheal deviation present. No thyromegaly present.  No cervical lymphadenopathy Cardiovascular: Normal rate, regular rhythm and normal heart sounds.   No murmur heard. No carotid bruit .  No edema Pulmonary/Chest: Effort normal and breath sounds normal. No respiratory distress. No has no wheezes. No rales.  Skin: Skin is warm and dry. Not diaphoretic.  Psychiatric: Normal mood and affect. Behavior is normal.      Assessment & Plan:    See Problem List for Assessment and Plan of chronic medical problems.    This visit occurred during the SARS-CoV-2 public health emergency.  Safety protocols were in place, including screening questions prior to the visit, additional usage of staff PPE, and extensive cleaning of exam room while observing appropriate contact time as indicated for disinfecting solutions.

## 2020-09-03 ENCOUNTER — Ambulatory Visit (INDEPENDENT_AMBULATORY_CARE_PROVIDER_SITE_OTHER): Payer: Medicare Other | Admitting: Internal Medicine

## 2020-09-03 ENCOUNTER — Other Ambulatory Visit: Payer: Self-pay

## 2020-09-03 VITALS — BP 130/68 | HR 66 | Temp 98.3°F | Ht 61.0 in | Wt 172.0 lb

## 2020-09-03 DIAGNOSIS — R350 Frequency of micturition: Secondary | ICD-10-CM | POA: Insufficient documentation

## 2020-09-03 DIAGNOSIS — D6851 Activated protein C resistance: Secondary | ICD-10-CM

## 2020-09-03 DIAGNOSIS — E538 Deficiency of other specified B group vitamins: Secondary | ICD-10-CM

## 2020-09-03 DIAGNOSIS — R739 Hyperglycemia, unspecified: Secondary | ICD-10-CM

## 2020-09-03 DIAGNOSIS — I10 Essential (primary) hypertension: Secondary | ICD-10-CM

## 2020-09-03 DIAGNOSIS — Z7901 Long term (current) use of anticoagulants: Secondary | ICD-10-CM | POA: Diagnosis not present

## 2020-09-03 DIAGNOSIS — E782 Mixed hyperlipidemia: Secondary | ICD-10-CM

## 2020-09-03 DIAGNOSIS — K219 Gastro-esophageal reflux disease without esophagitis: Secondary | ICD-10-CM | POA: Diagnosis not present

## 2020-09-03 DIAGNOSIS — E559 Vitamin D deficiency, unspecified: Secondary | ICD-10-CM | POA: Diagnosis not present

## 2020-09-03 LAB — VITAMIN B12: Vitamin B-12: 422 pg/mL (ref 211–911)

## 2020-09-03 LAB — CBC WITH DIFFERENTIAL/PLATELET
Basophils Absolute: 0 10*3/uL (ref 0.0–0.1)
Basophils Relative: 0.3 % (ref 0.0–3.0)
Eosinophils Absolute: 0.1 10*3/uL (ref 0.0–0.7)
Eosinophils Relative: 1.3 % (ref 0.0–5.0)
HCT: 38.2 % (ref 36.0–46.0)
Hemoglobin: 13 g/dL (ref 12.0–15.0)
Lymphocytes Relative: 22.6 % (ref 12.0–46.0)
Lymphs Abs: 1.1 10*3/uL (ref 0.7–4.0)
MCHC: 34.1 g/dL (ref 30.0–36.0)
MCV: 87.2 fl (ref 78.0–100.0)
Monocytes Absolute: 0.4 10*3/uL (ref 0.1–1.0)
Monocytes Relative: 9.2 % (ref 3.0–12.0)
Neutro Abs: 3.1 10*3/uL (ref 1.4–7.7)
Neutrophils Relative %: 66.6 % (ref 43.0–77.0)
Platelets: 179 10*3/uL (ref 150.0–400.0)
RBC: 4.38 Mil/uL (ref 3.87–5.11)
RDW: 13.5 % (ref 11.5–15.5)
WBC: 4.7 10*3/uL (ref 4.0–10.5)

## 2020-09-03 LAB — URINALYSIS, ROUTINE W REFLEX MICROSCOPIC
Bilirubin Urine: NEGATIVE
Ketones, ur: NEGATIVE
Nitrite: NEGATIVE
Specific Gravity, Urine: <=1.005 — AB (ref 1.000–1.030)
Total Protein, Urine: NEGATIVE
Urine Glucose: NEGATIVE
Urobilinogen, UA: 0.2 (ref 0.0–1.0)
pH: 6.5 (ref 5.0–8.0)

## 2020-09-03 LAB — PROTIME-INR
INR: 1.9 ratio — ABNORMAL HIGH (ref 0.8–1.0)
Prothrombin Time: 20.7 s — ABNORMAL HIGH (ref 9.6–13.1)

## 2020-09-03 LAB — HEMOGLOBIN A1C: Hgb A1c MFr Bld: 5.4 % (ref 4.6–6.5)

## 2020-09-03 LAB — VITAMIN D 25 HYDROXY (VIT D DEFICIENCY, FRACTURES): VITD: 22.99 ng/mL — ABNORMAL LOW (ref 30.00–100.00)

## 2020-09-03 NOTE — Assessment & Plan Note (Signed)
Chronic Currently taking vitamin B 12 daily Check level

## 2020-09-03 NOTE — Assessment & Plan Note (Signed)
Acute For the past 1-2 days she has been experiencing dysuria and increased urinary frequency Need to rule out UTI Urinalysis, urine culture

## 2020-09-03 NOTE — Assessment & Plan Note (Signed)
Chronic On warfarin Check INR

## 2020-09-03 NOTE — Assessment & Plan Note (Signed)
Chronic GERD controlled with diet Continue protonix 40 mg daily prn - rarely takes them

## 2020-09-03 NOTE — Assessment & Plan Note (Signed)
Chronic BP well controlled Continue amlodipine 5 mg daily, metoprolol XL 50 mg daily, spironolactone 12.5 mg daily cmp

## 2020-09-03 NOTE — Assessment & Plan Note (Signed)
Chronic Check a1c Low sugar / carb diet Stressed regular exercise  

## 2020-09-03 NOTE — Assessment & Plan Note (Signed)
Chronic Taking vitamin D daily Check vitamin D level  

## 2020-09-03 NOTE — Assessment & Plan Note (Signed)
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

## 2020-09-03 NOTE — Assessment & Plan Note (Signed)
Chronic Check lipid panel  Continue simvastatin 10 mg daily Regular exercise and healthy diet encouraged

## 2020-09-04 ENCOUNTER — Ambulatory Visit (INDEPENDENT_AMBULATORY_CARE_PROVIDER_SITE_OTHER): Payer: Medicare Other | Admitting: General Practice

## 2020-09-04 DIAGNOSIS — Z7901 Long term (current) use of anticoagulants: Secondary | ICD-10-CM

## 2020-09-04 DIAGNOSIS — Z86718 Personal history of other venous thrombosis and embolism: Secondary | ICD-10-CM

## 2020-09-04 LAB — COMPREHENSIVE METABOLIC PANEL
ALT: 7 U/L (ref 0–35)
AST: 14 U/L (ref 0–37)
Albumin: 4.3 g/dL (ref 3.5–5.2)
Alkaline Phosphatase: 102 U/L (ref 39–117)
BUN: 10 mg/dL (ref 6–23)
CO2: 23 mEq/L (ref 19–32)
Calcium: 9.6 mg/dL (ref 8.4–10.5)
Chloride: 105 mEq/L (ref 96–112)
Creatinine, Ser: 0.94 mg/dL (ref 0.40–1.20)
GFR: 57.91 mL/min — ABNORMAL LOW (ref >60.00)
Glucose, Bld: 98 mg/dL (ref 70–99)
Potassium: 3.9 mEq/L (ref 3.5–5.1)
Sodium: 140 mEq/L (ref 135–145)
Total Bilirubin: 0.6 mg/dL (ref 0.2–1.2)
Total Protein: 7.7 g/dL (ref 6.0–8.3)

## 2020-09-04 LAB — LIPID PANEL
Cholesterol: 153 mg/dL (ref 0–200)
HDL: 60.1 mg/dL (ref >39.00)
LDL Cholesterol: 64 mg/dL (ref 0–99)
NonHDL: 92.56
Total CHOL/HDL Ratio: 3
Triglycerides: 142 mg/dL (ref 0.0–149.0)
VLDL: 28.4 mg/dL (ref 0.0–40.0)

## 2020-09-04 NOTE — Patient Instructions (Signed)
Pre visit review using our clinic review tool, if applicable. No additional management support is needed unless otherwise documented below in the visit note.  Take extra 1/2 tablet today and then continue to take 1/2 tablet daily except 1 tablet on Mondays and Thursdays.  Re-check in to 4 weeks.

## 2020-09-05 ENCOUNTER — Telehealth: Payer: Self-pay | Admitting: Internal Medicine

## 2020-09-05 LAB — URINE CULTURE

## 2020-09-05 MED ORDER — AMOXICILLIN-POT CLAVULANATE 875-125 MG PO TABS: 1.0000 | ORAL_TABLET | Freq: Two times a day (BID) | ORAL | 0 refills | Status: AC

## 2020-09-05 NOTE — Addendum Note (Signed)
Addended by: [name] on: 09/05/2020 08:43 PM   Modules accepted: Orders

## 2020-09-05 NOTE — Telephone Encounter (Signed)
Patient called in regards to recent lab results. Please advise

## 2020-09-05 NOTE — Progress Notes (Signed)
Agree with management.  Deagen Krass J Zadia Uhde, MD  

## 2020-09-05 NOTE — Telephone Encounter (Signed)
Lab results have not resulted yet

## 2020-09-06 NOTE — Telephone Encounter (Signed)
Results given today 

## 2020-09-14 ENCOUNTER — Other Ambulatory Visit: Payer: Self-pay | Admitting: Internal Medicine

## 2020-09-24 ENCOUNTER — Other Ambulatory Visit: Payer: Self-pay | Admitting: Internal Medicine

## 2020-09-25 DIAGNOSIS — R5383 Other fatigue: Secondary | ICD-10-CM | POA: Insufficient documentation

## 2020-09-25 NOTE — Progress Notes (Signed)
Subjective:    Patient ID: Bonnie Zhang, female    DOB: 1941/03/20, 80 y.o.   MRN: [id number]  HPI The patient is here for an acute visit.   Last Wednesday she finished the antibiotic for her UTI.  Last Thursday, the next day, she started feeling fatigue, flushes and weak.   She was making her bed 1 day and had to sit down half way through.  She gets lightheaded and has a headache.  This all comes and goes.  Some days she feels ok.  Occ has nausea.  Worse in morning, but can occur anytime.  Some SOB with activity at times.   She feels better in the afternoon.  She denies any pain with urination.  She still has some increase in urinary frequency.  She denies any fevers.  She denies chest pain or palpitations.    Medications and allergies reviewed with patient and updated if appropriate.  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 10/09/2010   Osteoporosis 03/21/2010   History of colonic polyps 03/20/2008   Vitamin D deficiency 08/25/2007   HYPERLIPIDEMIA 38/25/0539   Dysmetabolic syndrome X 76/73/4193   Essential hypertension 03/18/2007   DVT, HX OF 07/23/2006    Current Outpatient Medications on File Prior to Visit   Medication Sig Dispense Refill   acetaminophen (TYLENOL) 500 MG tablet Take 500 mg by mouth every 6 (six) hours as needed.     amLODipine (NORVASC) 5 MG tablet TAKE 1 TABLET BY MOUTH DAILY. TAKE AN EXTRA TABLET IF BLOOD PRESSURE IS GREATER THAN 145/90 90 tablet 1   benazepril (LOTENSIN) 40 MG tablet TAKE 1 TABLET BY MOUTH DAILY. 90 tablet 1   cholecalciferol (VITAMIN D3) 25 MCG (1000 UNIT) tablet Take 1,000 Units by mouth daily.     metoprolol succinate (TOPROL-XL) 50 MG 24 hr tablet TAKE 1 TABLET BY MOUTH DAILY. TAKE WITH OR IMMEDIATELY FOLLOWING A MEAL. 90 tablet 1   pantoprazole (PROTONIX) 40 MG tablet TAKE 1 TABLET BY MOUTH ONCE DAILY AS NEEDED FOR ACID REFLUX 90 tablet 0   simvastatin (ZOCOR) 10 MG tablet TAKE 1 TABLET BY MOUTH AT BEDTIME. 90 tablet 1   spironolactone (ALDACTONE) 25 MG tablet TAKE 1/2 TABLET BY MOUTH DAILY. DUE FOR OFFICE VISIT 45 tablet 1   vitamin B-12 (CYANOCOBALAMIN) 1000 MCG tablet Take 1,000 mcg by mouth daily.     warfarin (COUMADIN) 5 MG tablet Take 1/2 tablet daily except take 1 tablet on Mon and Thurs or take as directed by anticoagulation clinic 90 tablet 1   No current facility-administered medications on file prior to visit.    Past Medical History:  Diagnosis Date   Anticoagulated on Coumadin 2001   2 blood clots   Bruises easily  pt is on Coumadin;last dose of Coumadin 08/05/11 and then Lovenox started 08/07/11   DVT (deep vein thrombosis) in pregnancy     1965 post C section;2001 with prolonged driving while on  HRT   DVT (deep venous thrombosis) ([place]) 2001   Was on Prempro.     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 Gery Pray   Homocysteinemia 01/23/2015   Hx of colonic polyps    Dr  Sharlett Iles   Hx of migraines    yrs ago    Hypertension    takes Benazepril,Amlodipine,and Metoprolol daily   Osteoporosis    PONV (postoperative nausea and vomiting)    Primary localized osteoarthritis of left knee    PVD (peripheral vascular disease) ([place]) 1965, 2001   abnormal venous doppler findings due to recurrent DVT'S left leg   Right knee DJD    knees   Shortness of breath dyspnea    Vitamin B12 deficiency (non anemic) 01/24/2015   Vitamin D deficiency    takes VIt D daily    Past Surgical History:  Procedure Laterality Date   [place]  1961/1965/1966   X 3   CHOLECYSTECTOMY  1997   COLONOSCOPY W/ POLYPECTOMY  2007   Dr  Sharlett Iles; due? 2012   [place]  2012   uterine polyp, Dr Kennon Rounds   ESOPHAGOGASTRODUODENOSCOPY  1997   HYSTEROSCOPY WITH D & C N/A 08/05/2012   Procedure: DILATATION AND CURETTAGE /HYSTEROSCOPY;  Surgeon: Donnamae Jude, MD;  Location: [facility];  Service: Gynecology;  Laterality: N/A;   ROBOTIC ASSISTED TOTAL HYSTERECTOMY WITH BILATERAL SALPINGO OOPHERECTOMY Right 09/14/2012   Procedure: ROBOTIC ASSISTED TOTAL HYSTERECTOMY WITH BILATERAL SALPINGO OOPHORECTOMY ,right pelvic LYMPHNODE dissection;  Surgeon: Imagene Gurney A. Alycia Rossetti, MD;  Location: [facility];  Service: Gynecology;  Laterality: Right;   TOTAL KNEE ARTHROPLASTY  08/11/2011   Procedure: TOTAL KNEE ARTHROPLASTY;  Surgeon: Lorn Junes, MD;  Location: [facility];  Service: Orthopedics;  Laterality: Right;  DR Noemi Chapel WANTS 90 MINUTES FOR THIS CASE   TOTAL KNEE ARTHROPLASTY Left 09/11/2014   TOTAL KNEE ARTHROPLASTY Left 09/11/2014   Procedure: TOTAL KNEE ARTHROPLASTY;  Surgeon: Elsie Saas, MD;  Location: [facility];  Service: Orthopedics;  Laterality: Left;   WISDOM TOOTH EXTRACTION      Social History   Socioeconomic History   Marital status: Divorced    Spouse name: Not on file   Number of children: 2    Years of education: Not on file   Highest education level: High school graduate  Occupational History   Occupation: Retired  Tobacco Use   Smoking status: Never   Smokeless tobacco: Never  Vaping Use   Vaping Use: Never used  Substance and Sexual Activity   Alcohol use: No   Drug use: No   Sexual activity: Not Currently    Birth control/protection: Post-menopausal    Comment: retired. has 2 daughters  Other Topics Concern   Not on file  Social History Narrative   Pt lives alone she has 2 daughter - right handed- drinks tea, soda sometimes - No regular exercise  Social Determinants of Health   Financial Resource Strain: Low Risk    Difficulty of Paying Living Expenses: Not hard at all  Food Insecurity: No Food Insecurity   Worried About Charity fundraiser in the Last Year: Never true   Arboriculturist in the Last Year: Never true  Transportation Needs: Not on file  Physical Activity: Sufficiently Active   Days of Exercise per Week: 5 days   Minutes of Exercise per Session: 30 min  Stress: No Stress Concern Present   Feeling of Stress : Not at all  Social Connections: Moderately Integrated   Frequency of Communication with Friends and Family: More than three times a week   Frequency of Social Gatherings with Friends and Family: More than three times a week   Attends Religious Services: More than 4 times per year   Active Member of [employer] or Organizations: Yes   Attends Archivist Meetings: More than 4 times per year   Marital Status: Widowed    Family History  Problem Relation Age of Onset   Kidney disease Mother        ? hypertensive   Hypertension Mother    Deep vein thrombosis Mother        post ankle fracture   Leukemia Father    Cancer Father    Colon cancer Maternal Grandmother    Diabetes Other        cousins   Hypertension Sister    Anesthesia problems Neg Hx    Hypotension Neg Hx    Malignant hyperthermia Neg Hx    Pseudochol deficiency Neg  Hx    Stroke Neg Hx    Heart disease Neg Hx    Breast cancer Neg Hx    Ovarian cancer Neg Hx    Uterine cancer Neg Hx     Review of Systems  Constitutional:  Positive for fatigue (and weakness). Negative for appetite change and fever.  Respiratory:  Positive for shortness of breath. Negative for cough and wheezing.   Cardiovascular:  Positive for leg swelling (mild - chronic). Negative for chest pain and palpitations.  Gastrointestinal:  Positive for nausea. Negative for abdominal pain, constipation and diarrhea.  Genitourinary:  Positive for frequency (some). Negative for dysuria and hematuria.  Neurological:  Positive for light-headedness and headaches. Negative for weakness and numbness.      Objective:   Vitals:   09/26/20 1056  BP: 138/74  Pulse: 65  Temp: 98.6 F (37 C)  SpO2: 98%   BP Readings from Last 3 Encounters:  09/26/20 138/74  09/03/20 130/68  08/30/20 (!) 143/63   Wt Readings from Last 3 Encounters:  09/26/20 168 lb (76.2 kg)  09/03/20 172 lb (78 kg)  08/30/20 171 lb 11.2 oz (77.9 kg)   Body mass index is 31.74 kg/m.   Physical Exam    Constitutional: Appears well-developed and well-nourished. No distress.  Head: Normocephalic and atraumatic.  Neck: Neck supple. No tracheal deviation present. No thyromegaly present.  No cervical lymphadenopathy Cardiovascular: Normal rate, regular rhythm and normal heart sounds.  No murmur heard. No carotid bruit .  No edema Pulmonary/Chest: Effort normal and breath sounds normal. No respiratory distress. No has no wheezes. No rales.  Skin: Skin is warm and dry. Not diaphoretic.  Psychiatric: Normal mood and affect. Behavior is normal.       Assessment & Plan:    See Problem List for Assessment and Plan of chronic medical problems.    This  visit occurred during the SARS-CoV-2 public health emergency.  Safety protocols were in place, including screening questions prior to the visit, additional usage of staff  PPE, and extensive cleaning of exam room while observing appropriate contact time as indicated for disinfecting solutions.

## 2020-09-26 ENCOUNTER — Other Ambulatory Visit: Payer: Self-pay

## 2020-09-26 ENCOUNTER — Encounter: Payer: Self-pay | Admitting: Internal Medicine

## 2020-09-26 ENCOUNTER — Ambulatory Visit (INDEPENDENT_AMBULATORY_CARE_PROVIDER_SITE_OTHER): Payer: Medicare Other | Admitting: Internal Medicine

## 2020-09-26 VITALS — BP 138/74 | HR 65 | Temp 98.6°F | Ht 61.0 in | Wt 168.0 lb

## 2020-09-26 DIAGNOSIS — K219 Gastro-esophageal reflux disease without esophagitis: Secondary | ICD-10-CM | POA: Diagnosis not present

## 2020-09-26 DIAGNOSIS — I10 Essential (primary) hypertension: Secondary | ICD-10-CM | POA: Diagnosis not present

## 2020-09-26 DIAGNOSIS — R5383 Other fatigue: Secondary | ICD-10-CM

## 2020-09-26 DIAGNOSIS — N3000 Acute cystitis without hematuria: Secondary | ICD-10-CM | POA: Diagnosis not present

## 2020-09-26 DIAGNOSIS — R42 Dizziness and giddiness: Secondary | ICD-10-CM | POA: Diagnosis not present

## 2020-09-26 DIAGNOSIS — R350 Frequency of micturition: Secondary | ICD-10-CM | POA: Diagnosis not present

## 2020-09-26 DIAGNOSIS — R0602 Shortness of breath: Secondary | ICD-10-CM | POA: Insufficient documentation

## 2020-09-26 LAB — CBC WITH DIFFERENTIAL/PLATELET
Basophils Absolute: 0 10*3/uL (ref 0.0–0.1)
Basophils Relative: 0.2 % (ref 0.0–3.0)
Eosinophils Absolute: 0 10*3/uL (ref 0.0–0.7)
Eosinophils Relative: 1 % (ref 0.0–5.0)
HCT: 40.2 % (ref 36.0–46.0)
Hemoglobin: 13.5 g/dL (ref 12.0–15.0)
Lymphocytes Relative: 23.4 % (ref 12.0–46.0)
Lymphs Abs: 1 10*3/uL (ref 0.7–4.0)
MCHC: 33.7 g/dL (ref 30.0–36.0)
MCV: 88 fl (ref 78.0–100.0)
Monocytes Absolute: 0.4 10*3/uL (ref 0.1–1.0)
Monocytes Relative: 8.1 % (ref 3.0–12.0)
Neutro Abs: 2.9 10*3/uL (ref 1.4–7.7)
Neutrophils Relative %: 67.3 % (ref 43.0–77.0)
Platelets: 178 10*3/uL (ref 150.0–400.0)
RBC: 4.57 Mil/uL (ref 3.87–5.11)
RDW: 13.1 % (ref 11.5–15.5)
WBC: 4.3 10*3/uL (ref 4.0–10.5)

## 2020-09-26 LAB — COMPREHENSIVE METABOLIC PANEL
ALT: 8 U/L (ref 0–35)
AST: 13 U/L (ref 0–37)
Albumin: 4.4 g/dL (ref 3.5–5.2)
Alkaline Phosphatase: 98 U/L (ref 39–117)
BUN: 15 mg/dL (ref 6–23)
CO2: 28 mEq/L (ref 19–32)
Calcium: 10.6 mg/dL — ABNORMAL HIGH (ref 8.4–10.5)
Chloride: 102 mEq/L (ref 96–112)
Creatinine, Ser: 0.99 mg/dL (ref 0.40–1.20)
GFR: 54.4 mL/min — ABNORMAL LOW (ref >60.00)
Glucose, Bld: 96 mg/dL (ref 70–99)
Potassium: 4.4 mEq/L (ref 3.5–5.1)
Sodium: 139 mEq/L (ref 135–145)
Total Bilirubin: 0.7 mg/dL (ref 0.2–1.2)
Total Protein: 8 g/dL (ref 6.0–8.3)

## 2020-09-26 LAB — URINALYSIS, ROUTINE W REFLEX MICROSCOPIC
Bilirubin Urine: NEGATIVE
Ketones, ur: NEGATIVE
Leukocytes,Ua: NEGATIVE
Nitrite: NEGATIVE
Specific Gravity, Urine: 1.015 (ref 1.000–1.030)
Total Protein, Urine: NEGATIVE
Urine Glucose: NEGATIVE
Urobilinogen, UA: 0.2 (ref 0.0–1.0)
pH: 6 (ref 5.0–8.0)

## 2020-09-26 NOTE — Assessment & Plan Note (Signed)
Chronic BP well controlled Continue benazepril 40 mg daily, amlodipine 5 mg daily, metoprolol XL 50 mg daily, spironolactone 12.5 mg daily cmp

## 2020-09-26 NOTE — Assessment & Plan Note (Signed)
Acute Episodic fatigue/weakness, lightheadedness, flashes, headaches and occasional nausea or shortness of breath with exertion The symptoms started the day after she completed antibiotics for UTI-?  Still with infection-check UA, urine culture Some of her symptoms are concerning for possible cardiac cause EKG today: Sinus bradycardia at 48 bpm, RBBB, otherwise normal EKG.  Compared to EKG from 03/2019 right bundle branch block is new now late.  The bradycardia is also new.  ?  Bradycardia associated with symptoms, versus possible ischemia-referred to cardiology today Will have her monitor her BP and heart rate when symptomatic possible

## 2020-09-26 NOTE — Patient Instructions (Addendum)
   EKG was done today   Have blood work and urine tests done downstairs.    A referral was ordered for cardiology.

## 2020-09-26 NOTE — Assessment & Plan Note (Signed)
Still experiencing urinary frequency Just completed antibiotic for UTI 1 week ago Check urinalysis, urine culture

## 2020-09-26 NOTE — Assessment & Plan Note (Signed)
As above.

## 2020-09-26 NOTE — Assessment & Plan Note (Signed)
Chronic GERD controlled Continue pantoprazole 40 mg daily Occasional nausea unlikely related to GERD

## 2020-09-27 LAB — URINE CULTURE

## 2020-10-02 ENCOUNTER — Ambulatory Visit: Payer: Medicare Other

## 2020-10-02 ENCOUNTER — Telehealth: Payer: Self-pay | Admitting: Internal Medicine

## 2020-10-02 NOTE — Telephone Encounter (Signed)
Attempted to reach patient but no voicemail or answering machine for home number. Will contact patient again later.

## 2020-10-02 NOTE — Telephone Encounter (Signed)
Follow up message   Patient would like to know the results of EKG

## 2020-10-02 NOTE — Telephone Encounter (Signed)
   Patient called and said that she cannot be seen until 8/17 with cardiology in [place].

## 2020-10-02 NOTE — Telephone Encounter (Signed)
Spoke with patient today and results given. 

## 2020-10-02 NOTE — Telephone Encounter (Signed)
EKG looked good.  I just want her to monitor her BP and heart rate at home and update [place] with her readings.

## 2020-10-04 ENCOUNTER — Ambulatory Visit (INDEPENDENT_AMBULATORY_CARE_PROVIDER_SITE_OTHER): Payer: Medicare Other | Admitting: General Practice

## 2020-10-04 ENCOUNTER — Other Ambulatory Visit: Payer: Self-pay

## 2020-10-04 DIAGNOSIS — Z86718 Personal history of other venous thrombosis and embolism: Secondary | ICD-10-CM | POA: Diagnosis not present

## 2020-10-04 DIAGNOSIS — Z7901 Long term (current) use of anticoagulants: Secondary | ICD-10-CM | POA: Diagnosis not present

## 2020-10-04 LAB — POCT INR: INR: 3.4 — AB (ref 2.0–3.0)

## 2020-10-04 NOTE — Progress Notes (Signed)
Agree with management.  Javaris Wigington J Derrious Bologna, MD  

## 2020-10-04 NOTE — Patient Instructions (Signed)
Pre visit review using our clinic review tool, if applicable. No additional management support is needed unless otherwise documented below in the visit note.  Hold dosage tomorrow and then start taking 1/2 tablet daily except 1 tablet on Mondays .    Re-check in to 4 weeks.

## 2020-10-08 ENCOUNTER — Other Ambulatory Visit: Payer: Self-pay

## 2020-10-08 ENCOUNTER — Ambulatory Visit: Payer: Medicare Other | Admitting: Cardiology

## 2020-10-08 ENCOUNTER — Encounter: Payer: Self-pay | Admitting: Cardiology

## 2020-10-08 VITALS — BP 157/66 | HR 66 | Ht 61.0 in | Wt 167.0 lb

## 2020-10-08 DIAGNOSIS — E7800 Pure hypercholesterolemia, unspecified: Secondary | ICD-10-CM | POA: Diagnosis not present

## 2020-10-08 DIAGNOSIS — I10 Essential (primary) hypertension: Secondary | ICD-10-CM | POA: Diagnosis not present

## 2020-10-08 DIAGNOSIS — R0600 Dyspnea, unspecified: Secondary | ICD-10-CM | POA: Diagnosis not present

## 2020-10-08 DIAGNOSIS — R0609 Other forms of dyspnea: Secondary | ICD-10-CM

## 2020-10-08 NOTE — Patient Instructions (Signed)
Medication Instructions:  Your physician recommends that you continue on your current medications as directed. Please refer to the Current Medication list given to you today.  *If you need a refill on your cardiac medications before your next appointment, please call your pharmacy*   Lab Work: None ordered If you have labs (blood work) drawn today and your tests are completely normal, you will receive your results only by: [place] (if you have MyChart) OR A paper copy in the mail If you have any lab test that is abnormal or we need to change your treatment, we will call you to review the results.   Testing/Procedures:   Your physician has requested that you have an echocardiogram. Echocardiography is a painless test that uses sound waves to create images of your heart. It provides your doctor with information about the size and shape of your heart and how well your heart's chambers and valves are working. This procedure takes approximately one hour. There are no restrictions for this procedure.    2.  [place]   Your caregiver has ordered a Stress Test with nuclear imaging. The purpose of this test is to evaluate the blood supply to your heart muscle. This procedure is referred to as a "Non-Invasive Stress Test." This is because other than having an IV started in your vein, nothing is inserted or "invades" your body. Cardiac stress tests are done to find areas of poor blood flow to the heart by determining the extent of coronary artery disease (CAD). Some patients exercise on a treadmill, which naturally increases the blood flow to your heart, while others who are  unable to walk on a treadmill due to physical limitations have a pharmacologic/chemical stress agent called Lexiscan . This medicine will mimic walking on a treadmill by temporarily increasing your coronary blood flow.      PLEASE REPORT TO [facility] MEDICAL MALL ENTRANCE   THE VOLUNTEERS AT THE FIRST DESK WILL DIRECT YOU  WHERE TO GO    *Please note: these test may take anywhere between 2-4 hours to complete       Date of Procedure:_____________________________________   Arrival Time for Procedure:______________________________    PLEASE NOTIFY THE OFFICE AT LEAST 24 HOURS IN ADVANCE IF YOU ARE UNABLE TO KEEP YOUR APPOINTMENT.  [place] 24 HOURS IN ADVANCE IF YOU ARE UNABLE TO KEEP YOUR APPOINTMENT. [phone]    How to prepare for your Myoview test:     _XX___:  Hold betablocker(s) night before procedure and morning of procedure:   metoprolol succinate (TOPROL-XL)   1. Do not eat or drink after midnight  2. No caffeine for 24 hours prior to test  3. No smoking 24 hours prior to test.  4. Unless instructed otherwise, Take your medication with a small sips of water.    5.         Ladies, please do not wear dresses. Skirts or pants are appropriate. Please wear a short sleeve shirt.  6. No perfume, cologne or lotion.  7. Wear comfortable walking shoes. No heels!    Follow-Up: At [facility], you and your health needs are our priority.  As part of our continuing mission to provide you with exceptional heart care, we have created designated Provider Care Teams.  These Care Teams include your primary Cardiologist (physician) and Advanced Practice Providers (APPs -  Physician Assistants and Nurse Practitioners) who all work together to provide you with the care  you need, when you need it.  We recommend signing up for the patient portal called "MyChart".  Sign up information is provided on this After Visit Summary.  MyChart is used to connect with patients for Virtual Visits (Telemedicine).  Patients are able to view lab/test results, encounter notes, upcoming appointments, etc.  Non-urgent messages can be sent to your provider as well.   To learn more about what you can do with MyChart, go to [email or web address].    Your next appointment:    Follow up after testing   The format for your next appointment:   In Person  Provider:   You may see Kate Sable, MD or one of the following Advanced Practice Providers on your designated Care Team:   Murray Hodgkins, NP Christell Faith, PA-C Marrianne Mood, PA-C Cadence Kathlen Mody, [place]   Other Instructions

## 2020-10-08 NOTE — Progress Notes (Signed)
Cardiology Office Note:    Date:  10/08/2020   ID:  Bonnie Zhang, DOB 1940-04-13, MRN [id number]  PCP:  [name], MD   [place] Providers Cardiologist:  Kate Sable, MD     Referring MD: [name], MD   Chief Complaint  Patient presents with   Other    Referred by PCP for Fatigue, SOB, and lightheadedness. Meds reviewed verbally with patient.     History of Present Illness:    Bonnie Zhang is a 80 y.o. 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  Patient states having fatigue and shortness of breath over the past 2 weeks.  Symptoms are sometimes associated with exertion, although may or COVID.  She denies chest pain.  Denies smoking history.  Has occasional flushing not associated with exertion.  The last time she had flushing symptoms was years ago when she was diagnosed with a mini stroke.  He is to follow-up with neurology.  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  Recent MRI 08/10/2020 shows stability in mass.  Blood pressure at home usually controlled with systolics in the 347Q.  Blood pressure gets elevated when she feels flushed.  Prior notes Echo 2595 normal systolic function, impaired relaxation, EF 55 to 60%  Past Medical History:  Diagnosis Date   Anticoagulated on Coumadin 2001   2 blood clots   Bruises easily    pt is on Coumadin;last dose of Coumadin 08/05/11 and then Lovenox started 08/07/11   DVT (deep vein thrombosis) in pregnancy     1965 post C section;2001 with prolonged driving while on  HRT   DVT (deep venous thrombosis) ([place]) 2001   Was on Prempro.     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 Gery Pray   Homocysteinemia 01/23/2015   Hx of colonic polyps    Dr Sharlett Iles   Hx of migraines    yrs ago    Hypertension    takes Benazepril,Amlodipine,and Metoprolol daily   Osteoporosis    PONV (postoperative nausea and vomiting)    Primary localized osteoarthritis of left knee    PVD (peripheral vascular disease) ([place]) 1965, 2001   abnormal venous doppler findings due to recurrent DVT'S left leg   Right knee DJD    knees   Shortness of breath dyspnea    Vitamin B12 deficiency (non anemic) 01/24/2015   Vitamin D deficiency    takes VIt D daily    Past Surgical History:  Procedure Laterality Date   [place]  1961/1965/1966   X 3   CHOLECYSTECTOMY  1997   COLONOSCOPY W/ POLYPECTOMY  2007   Dr  Sharlett Iles; due? 2012   [place]  2012   uterine polyp, Dr Kennon Rounds   ESOPHAGOGASTRODUODENOSCOPY  1997   HYSTEROSCOPY WITH D & C N/A 08/05/2012   Procedure: DILATATION AND CURETTAGE /HYSTEROSCOPY;  Surgeon: Donnamae Jude, MD;  Location: [facility];  Service: Gynecology;  Laterality: N/A;   ROBOTIC  ASSISTED TOTAL HYSTERECTOMY WITH BILATERAL SALPINGO OOPHERECTOMY Right 09/14/2012   Procedure: ROBOTIC ASSISTED TOTAL HYSTERECTOMY WITH BILATERAL SALPINGO OOPHORECTOMY ,right pelvic LYMPHNODE dissection;  Surgeon: Imagene Gurney A. Alycia Rossetti, MD;  Location: [facility];  Service: Gynecology;  Laterality: Right;   TOTAL KNEE ARTHROPLASTY  08/11/2011   Procedure: TOTAL KNEE ARTHROPLASTY;  Surgeon: Lorn Junes, MD;  Location: [place];  Service: Orthopedics;  Laterality: Right;  DR Noemi Chapel WANTS 90 MINUTES FOR THIS CASE   TOTAL KNEE ARTHROPLASTY Left 09/11/2014   TOTAL KNEE ARTHROPLASTY Left 09/11/2014   Procedure: TOTAL KNEE ARTHROPLASTY;  Surgeon: Elsie Saas, MD;  Location: [facility];  Service: Orthopedics;  Laterality: Left;   WISDOM TOOTH EXTRACTION      Current  Medications: Current Meds  Medication Sig   acetaminophen (TYLENOL) 500 MG tablet Take 500 mg by mouth every 6 (six) hours as needed.   amLODipine (NORVASC) 5 MG tablet TAKE 1 TABLET BY MOUTH DAILY. TAKE AN EXTRA TABLET IF BLOOD PRESSURE IS GREATER THAN 145/90   benazepril (LOTENSIN) 40 MG tablet TAKE 1 TABLET BY MOUTH DAILY.   cholecalciferol (VITAMIN D3) 25 MCG (1000 UNIT) tablet Take 1,000 Units by mouth daily.   metoprolol succinate (TOPROL-XL) 50 MG 24 hr tablet TAKE 1 TABLET BY MOUTH DAILY. TAKE WITH OR IMMEDIATELY FOLLOWING A MEAL.   pantoprazole (PROTONIX) 40 MG tablet TAKE 1 TABLET BY MOUTH ONCE DAILY AS NEEDED FOR ACID REFLUX   simvastatin (ZOCOR) 10 MG tablet TAKE 1 TABLET BY MOUTH AT BEDTIME.   spironolactone (ALDACTONE) 25 MG tablet TAKE 1/2 TABLET BY MOUTH DAILY. DUE FOR OFFICE VISIT   vitamin B-12 (CYANOCOBALAMIN) 1000 MCG tablet Take 1,000 mcg by mouth daily.   warfarin (COUMADIN) 5 MG tablet Take 1/2 tablet daily except take 1 tablet on Mon and Thurs or take as directed by anticoagulation clinic     Allergies:   Vicodin [hydrocodone-acetaminophen], Aspirin, Atorvastatin, and Hctz [hydrochlorothiazide]   Social History   Socioeconomic History   Marital status: Divorced    Spouse name: Not on file   Number of children: 2   Years of education: Not on file   Highest education level: High school graduate  Occupational History   Occupation: Retired  Tobacco Use   Smoking status: Never   Smokeless tobacco: Never  Vaping Use   Vaping Use: Never used  Substance and Sexual Activity   Alcohol use: No   Drug use: No   Sexual activity: Not Currently    Birth control/protection: Post-menopausal    Comment: retired. has 2 daughters  Other Topics Concern   Not on file  Social History Narrative   Pt lives alone she has 2 daughter - right handed- drinks tea, soda sometimes - No regular exercise   Social Determinants of Health   Financial Resource Strain: Low Risk     Difficulty of Paying Living Expenses: Not hard at all  Food Insecurity: No Food Insecurity   Worried About Charity fundraiser in the Last Year: Never true   Arboriculturist in the Last Year: Never true  Transportation Needs: Not on file  Physical Activity: Sufficiently Active   Days of Exercise per Week: 5 days   Minutes of Exercise per Session: 30 min  Stress: No Stress Concern Present   Feeling of Stress : Not at all  Social Connections: Moderately Integrated   Frequency of Communication with Friends and Family: More than three times a week   Frequency of Social Gatherings with Friends and  Family: More than three times a week   Attends Religious Services: More than 4 times per year   Active Member of Clubs or Organizations: Yes   Attends Archivist Meetings: More than 4 times per year   Marital Status: Widowed     Family History: The patient's family history includes Cancer in her father; Colon cancer in her maternal grandmother; Deep vein thrombosis in her mother; Diabetes in an other family member; Hypertension in her mother and sister; Kidney disease in her mother; Leukemia in her father. There is no history of Anesthesia problems, Hypotension, Malignant hyperthermia, Pseudochol deficiency, Stroke, Heart disease, Breast cancer, Ovarian cancer, or Uterine cancer.  ROS:   Please see the history of present illness.     All other systems reviewed and are negative.  EKGs/Labs/Other Studies Reviewed:    The following studies were reviewed today:   EKG:  EKG is not ordered today.    ECG from PCP office obtained 09/26/2020 showed sinus bradycardia, right bundle branch block.  Recent Labs: 03/08/2020: TSH 1.48 09/26/2020: ALT 8; BUN 15; Creatinine, Ser 0.99; Hemoglobin 13.5; Platelets 178.0; Potassium 4.4; Sodium 139  Recent Lipid Panel    Component Value Date/Time   CHOL 153 09/03/2020 1041   CHOL 178 05/25/2014 1121   TRIG 142.0 09/03/2020 1041   TRIG 138 05/25/2014  1121   HDL 60.10 09/03/2020 1041   HDL 76 05/25/2014 1121   CHOLHDL 3 09/03/2020 1041   VLDL 28.4 09/03/2020 1041   LDLCALC 64 09/03/2020 1041   LDLCALC 74 05/25/2014 1121     Risk Assessment/Calculations:          Physical Exam:    VS:  BP (!) 157/66 (BP Location: Left Arm, Patient Position: Sitting, Cuff Size: Normal)   Pulse 66   Ht 5\' 1"  (1.549 m)   Wt 167 lb (75.8 kg)   SpO2 98%   BMI 31.55 kg/m     Wt Readings from Last 3 Encounters:  10/08/20 167 lb (75.8 kg)  09/26/20 168 lb (76.2 kg)  09/03/20 172 lb (78 kg)     GEN:  Well nourished, well developed in no acute distress HEENT: Normal NECK: No JVD; No carotid bruits LYMPHATICS: No lymphadenopathy CARDIAC: RRR, no murmurs, rubs, gallops RESPIRATORY:  Clear to auscultation without rales, wheezing or rhonchi  ABDOMEN: Soft, non-tender, non-distended MUSCULOSKELETAL:  No edema; No deformity  SKIN: Warm and dry NEUROLOGIC:  Alert and oriented x 3 PSYCHIATRIC:  Normal affect   ASSESSMENT:    1. Dyspnea on exertion   2. Primary hypertension   3. Pure hypercholesterolemia    PLAN:    In order of problems listed above:  Dyspnea on exertion, risk factors hypertension, hyperlipidemia.  Obtain echo, Lexiscan Myoview to evaluate presence of ischemia.  Symptoms of flushing appear neurologic.  Follow-up with PCP regarding management/neurology referral.  Unsure of any brain masses contributing. Hypertension, BP elevated, usually controlled.  Controlled at home.  Continue medications as prescribed for now. Hyperlipidemia, cholesterol controlled.  Continue statin.  Follow-up after echo and Myoview.  Shared Decision Making/Informed Consent The risks [chest pain, shortness of breath, cardiac arrhythmias, dizziness, blood pressure fluctuations, myocardial infarction, stroke/transient ischemic attack, nausea, vomiting, allergic reaction, radiation exposure, metallic taste sensation and life-threatening complications  (estimated to be 1 in 10,000)], benefits (risk stratification, diagnosing coronary artery disease, treatment guidance) and alternatives of a nuclear stress test were discussed in detail with Ms. Habibi and she agrees to proceed.  Medication Adjustments/Labs and Tests Ordered: Current medicines are reviewed at length with the patient today.  Concerns regarding medicines are outlined above.  No orders of the defined types were placed in this encounter.  No orders of the defined types were placed in this encounter.   Patient Instructions  Medication Instructions:  Your physician recommends that you continue on your current medications as directed. Please refer to the Current Medication list given to you today.  *If you need a refill on your cardiac medications before your next appointment, please call your pharmacy*   Lab Work: None ordered If you have labs (blood work) drawn today and your tests are completely normal, you will receive your results only by: [place] (if you have MyChart) OR A paper copy in the mail If you have any lab test that is abnormal or we need to change your treatment, we will call you to review the results.   Testing/Procedures:   Your physician has requested that you have an echocardiogram. Echocardiography is a painless test that uses sound waves to create images of your heart. It provides your doctor with information about the size and shape of your heart and how well your heart's chambers and valves are working. This procedure takes approximately one hour. There are no restrictions for this procedure.    2.  [place]   Your caregiver has ordered a Stress Test with nuclear imaging. The purpose of this test is to evaluate the blood supply to your heart muscle. This procedure is referred to as a "Non-Invasive Stress Test." This is because other than having an IV started in your vein, nothing is inserted or "invades" your body. Cardiac stress  tests are done to find areas of poor blood flow to the heart by determining the extent of coronary artery disease (CAD). Some patients exercise on a treadmill, which naturally increases the blood flow to your heart, while others who are  unable to walk on a treadmill due to physical limitations have a pharmacologic/chemical stress agent called Lexiscan . This medicine will mimic walking on a treadmill by temporarily increasing your coronary blood flow.      PLEASE REPORT TO [facility] MEDICAL MALL ENTRANCE   THE VOLUNTEERS AT THE FIRST DESK WILL DIRECT YOU WHERE TO GO    *Please note: these test may take anywhere between 2-4 hours to complete       Date of Procedure:_____________________________________   Arrival Time for Procedure:______________________________    PLEASE NOTIFY THE OFFICE AT LEAST 24 HOURS IN ADVANCE IF YOU ARE UNABLE TO KEEP YOUR APPOINTMENT.  [place] 24 HOURS IN ADVANCE IF YOU ARE UNABLE TO KEEP YOUR APPOINTMENT. [phone]    How to prepare for your Myoview test:     _XX___:  Hold betablocker(s) night before procedure and morning of procedure:   metoprolol succinate (TOPROL-XL)   1. Do not eat or drink after midnight  2. No caffeine for 24 hours prior to test  3. No smoking 24 hours prior to test.  4. Unless instructed otherwise, Take your medication with a small sips of water.    5.         Ladies, please do not wear dresses. Skirts or pants are appropriate. Please wear a short sleeve shirt.  6. No perfume, cologne or lotion.  7. Wear comfortable walking shoes. No heels!    Follow-Up: At [facility], you and your health needs are our priority.  As part of our continuing mission to provide you with exceptional heart care, we have created designated Provider Care Teams.  These Care Teams include your primary Cardiologist (physician) and Advanced Practice Providers (APPs -  Physician Assistants and Nurse  Practitioners) who all work together to provide you with the care you need, when you need it.  We recommend signing up for the patient portal called "MyChart".  Sign up information is provided on this After Visit Summary.  MyChart is used to connect with patients for Virtual Visits (Telemedicine).  Patients are able to view lab/test results, encounter notes, upcoming appointments, etc.  Non-urgent messages can be sent to your provider as well.   To learn more about what you can do with MyChart, go to [email or web address].    Your next appointment:   Follow up after testing   The format for your next appointment:   In Person  Provider:   You may see Kate Sable, MD or one of the following Advanced Practice Providers on your designated Care Team:   Murray Hodgkins, NP Christell Faith, PA-C Marrianne Mood, PA-C Cadence [place], [place]   Other Instructions     Signed, Kate Sable, MD  10/08/2020 11:45 AM    [place]

## 2020-10-08 NOTE — Addendum Note (Signed)
Addended by: Kavin Leech on: 10/08/2020 12:56 PM   Modules accepted: Orders

## 2020-10-09 ENCOUNTER — Ambulatory Visit (INDEPENDENT_AMBULATORY_CARE_PROVIDER_SITE_OTHER): Payer: Medicare Other

## 2020-10-09 DIAGNOSIS — R0600 Dyspnea, unspecified: Secondary | ICD-10-CM

## 2020-10-09 DIAGNOSIS — R0609 Other forms of dyspnea: Secondary | ICD-10-CM

## 2020-10-09 LAB — ECHOCARDIOGRAM COMPLETE
AR max vel: 2.79 cm2
AV Area VTI: 2.78 cm2
AV Area mean vel: 3 cm2
AV Mean grad: 3 mmHg
AV Peak grad: 5.7 mmHg
Ao pk vel: 1.19 m/s
Area-P 1/2: 8.92 cm2
Calc EF: 62.8 %
S' Lateral: 3 cm
Single Plane A2C EF: 63 %
Single Plane A4C EF: 60.4 %

## 2020-10-15 ENCOUNTER — Other Ambulatory Visit: Payer: Self-pay | Admitting: Internal Medicine

## 2020-10-15 ENCOUNTER — Other Ambulatory Visit: Payer: Self-pay | Admitting: General Practice

## 2020-10-15 DIAGNOSIS — Z7901 Long term (current) use of anticoagulants: Secondary | ICD-10-CM

## 2020-10-15 MED ORDER — WARFARIN SODIUM 5 MG PO TABS
ORAL_TABLET | ORAL | 1 refills | Status: DC
Start: 2020-10-15 — End: 2021-08-12

## 2020-10-15 NOTE — Addendum Note (Signed)
Addended by: Kate Sable on: 10/15/2020 05:08 PM   Modules accepted: Orders

## 2020-10-16 ENCOUNTER — Other Ambulatory Visit: Payer: Self-pay

## 2020-10-16 ENCOUNTER — Encounter
Admission: RE | Admit: 2020-10-16 | Discharge: 2020-10-16 | Disposition: A | Discharge status: Home / Self Care | Payer: Medicare Other | Source: Ambulatory Visit | Attending: Cardiology | Admitting: Cardiology

## 2020-10-16 DIAGNOSIS — R0600 Dyspnea, unspecified: Secondary | ICD-10-CM | POA: Diagnosis not present

## 2020-10-16 DIAGNOSIS — R0609 Other forms of dyspnea: Secondary | ICD-10-CM

## 2020-10-16 LAB — NM MYOCAR MULTI W/SPECT W/WALL MOTION / EF
Estimated workload: 1 METS
Exercise duration (min): 0 min
Exercise duration (sec): 0 s
LV dias vol: 62 mL (ref 46–106)
LV sys vol: 18 mL
MPHR: 141 {beats}/min
Peak HR: 72 {beats}/min
Percent HR: 51 %
Rest HR: 54 {beats}/min
SDS: 4
SRS: 19
SSS: 17
TID: 0.97

## 2020-10-16 MED ORDER — TECHNETIUM TC 99M TETROFOSMIN IV KIT
32.9800 | PACK | Freq: Once | INTRAVENOUS | Status: AC
  Administered 2020-10-16: 32.98 via INTRAVENOUS

## 2020-10-16 MED ORDER — REGADENOSON 0.4 MG/5ML IV SOLN
0.4000 mg | Freq: Once | INTRAVENOUS | Status: AC
  Administered 2020-10-16: 0.4 mg via INTRAVENOUS

## 2020-10-16 MED ORDER — TECHNETIUM TC 99M TETROFOSMIN IV KIT
9.9800 | PACK | Freq: Once | INTRAVENOUS | Status: AC
  Administered 2020-10-16: 9.98 via INTRAVENOUS

## 2020-10-18 ENCOUNTER — Telehealth: Payer: Self-pay | Admitting: Internal Medicine

## 2020-10-18 DIAGNOSIS — R0602 Shortness of breath: Secondary | ICD-10-CM

## 2020-10-18 NOTE — Telephone Encounter (Signed)
Pt would like a call back. She has seen cardiology and had an Echo everything has checked out ok but she is still fatigued

## 2020-10-19 NOTE — Progress Notes (Signed)
LMOV to schedule  

## 2020-10-19 NOTE — Telephone Encounter (Signed)
If she is still SOB we should consider having her see a lung doctor for further evaluation since it is not her heart - does she agree?

## 2020-10-30 NOTE — Telephone Encounter (Signed)
   Please call patient to discuss Pulmonary referral

## 2020-10-30 NOTE — Telephone Encounter (Signed)
ordered

## 2020-10-30 NOTE — Addendum Note (Signed)
Addended by: [name] on: 10/30/2020 12:07 PM   Modules accepted: Orders

## 2020-10-31 NOTE — Progress Notes (Addendum)
Synopsis: Referred for DOE by [name], MD  Subjective:   PATIENT ID: Bonnie Zhang GENDER: female DOB: [birth year], MRN: [id number]  Chief Complaint  Patient presents with   Consult    Pt being referred by PCP due to SOB. Pt states that she has had complaints of fatigue and SOB x2-3 weeks. States if the fatigue is bad enough, that is when she will have the SOB and she says it can come out of nowhere.   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 Also endorses intermittent flushing.  Seen by cardiology 7/11 with TTE showing G2DD, normal nuke stress. Weight is stable to decreasing slightly over the last year.   Notes episodic dyspnea following course of augmentin for UTI 09/05/20 along with mainly profound fatigue, bilateral facial flushing, weakness. She noticed it when she was picking up sticks in the yard. Episodes/triggers are hard to predict. Episodes may last only for 5 minutes or so, sits down and passes.   Never had asthma as a kid. Never has tried inhalers and hasn't had any steroids recently.   Otherwise pertinent review of systems is negative.  No family history of lung issues.   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 Never smoker.   Past Medical History:  Diagnosis Date   Anticoagulated on Coumadin 2001   2 blood clots   Bruises easily    pt is on Coumadin;last dose of Coumadin 08/05/11 and then Lovenox started 08/07/11   DVT (deep vein thrombosis) in pregnancy     1965 post C section;2001 with prolonged driving while on  HRT   DVT (deep venous thrombosis) ([place]) 2001   Was on Prempro.     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 Gery Pray   Homocysteinemia 01/23/2015   Hx of colonic polyps    Dr Sharlett Iles   Hx of migraines    yrs ago    Hypertension    takes Benazepril,Amlodipine,and Metoprolol daily   Osteoporosis    PONV (postoperative nausea and vomiting)    Primary localized osteoarthritis of left knee    PVD (peripheral vascular disease) ([place]) 1965, 2001   abnormal venous doppler findings due to recurrent DVT'S left leg   Right knee DJD    knees   Shortness of breath dyspnea    Vitamin B12 deficiency (non anemic) 01/24/2015   Vitamin D deficiency    takes VIt D daily     Family History  Problem Relation Age of Onset   Kidney disease Mother        ? hypertensive   Hypertension Mother    Deep vein thrombosis Mother        post ankle fracture   Leukemia Father    Cancer Father    Colon cancer Maternal Grandmother    Diabetes Other        cousins   Hypertension Sister    Anesthesia problems Neg Hx    Hypotension Neg Hx    Malignant hyperthermia Neg Hx    Pseudochol deficiency Neg Hx  Stroke Neg Hx    Heart disease Neg Hx    Breast cancer Neg Hx    Ovarian cancer Neg Hx    Uterine cancer Neg Hx      Past Surgical History:  Procedure Laterality Date   [place]  1961/1965/1966   X 3   CHOLECYSTECTOMY  1997   COLONOSCOPY W/ POLYPECTOMY  2007   Dr  Sharlett Iles; due? 2012   [place]  2012   uterine polyp, Dr Kennon Rounds   ESOPHAGOGASTRODUODENOSCOPY  1997   HYSTEROSCOPY WITH D & C N/A 08/05/2012   Procedure: DILATATION AND CURETTAGE /HYSTEROSCOPY;  Surgeon: Donnamae Jude, MD;  Location: [facility];  Service: Gynecology;  Laterality: N/A;   ROBOTIC ASSISTED TOTAL HYSTERECTOMY WITH BILATERAL SALPINGO OOPHERECTOMY Right 09/14/2012   Procedure: ROBOTIC ASSISTED TOTAL  HYSTERECTOMY WITH BILATERAL SALPINGO OOPHORECTOMY ,right pelvic LYMPHNODE dissection;  Surgeon: Imagene Gurney A. Alycia Rossetti, MD;  Location: [facility];  Service: Gynecology;  Laterality: Right;   TOTAL KNEE ARTHROPLASTY  08/11/2011   Procedure: TOTAL KNEE ARTHROPLASTY;  Surgeon: Lorn Junes, MD;  Location: [facility];  Service: Orthopedics;  Laterality: Right;  DR Noemi Chapel WANTS 90 MINUTES FOR THIS CASE   TOTAL KNEE ARTHROPLASTY Left 09/11/2014   TOTAL KNEE ARTHROPLASTY Left 09/11/2014   Procedure: TOTAL KNEE ARTHROPLASTY;  Surgeon: Elsie Saas, MD;  Location: [facility];  Service: Orthopedics;  Laterality: Left;   WISDOM TOOTH EXTRACTION      Social History   Socioeconomic History   Marital status: Divorced    Spouse name: Not on file   Number of children: 2   Years of education: Not on file   Highest education level: High school graduate  Occupational History   Occupation: Retired  Tobacco Use   Smoking status: Never   Smokeless tobacco: Never  Vaping Use   Vaping Use: Never used  Substance and Sexual Activity   Alcohol use: No   Drug use: No   Sexual activity: Not Currently    Birth control/protection: Post-menopausal    Comment: retired. has 2 daughters  Other Topics Concern   Not on file  Social History Narrative   Pt lives alone she has 2 daughter - right handed- drinks tea, soda sometimes - No regular exercise   Social Determinants of Health   Financial Resource Strain: Low Risk    Difficulty of Paying Living Expenses: Not hard at all  Food Insecurity: No Food Insecurity   Worried About Charity fundraiser in the Last Year: Never true   Arboriculturist in the Last Year: Never true  Transportation Needs: Not on file  Physical Activity: Sufficiently Active   Days of Exercise per Week: 5 days   Minutes of Exercise per Session: 30 min  Stress: No Stress Concern Present   Feeling of Stress : Not at all  Social Connections: Moderately Integrated   Frequency of Communication with Friends and  Family: More than three times a week   Frequency of Social Gatherings with Friends and Family: More than three times a week   Attends Religious Services: More than 4 times per year   Active Member of [employer] or Organizations: Yes   Attends Archivist Meetings: More than 4 times per year   Marital Status: Widowed  Intimate Partner Violence: Not on file     Allergies  Allergen Reactions   Vicodin [  Hydrocodone-Acetaminophen]     Nightmares   Aspirin Other (See Comments)    upset stomach   Atorvastatin Other (See Comments)    leg pain   Hctz [Hydrochlorothiazide] Other (See Comments)    FATIGUE AND EXTREMELY LOW POTASSIUM     Outpatient Medications Prior to Visit  Medication Sig Dispense Refill   acetaminophen (TYLENOL) 500 MG tablet Take 500 mg by mouth every 6 (six) hours as needed.     amLODipine (NORVASC) 5 MG tablet TAKE 1 TABLET BY MOUTH DAILY. TAKE AN EXTRA TABLET IF BLOOD PRESSURE IS GREATER THAN 145/90 90 tablet 1   benazepril (LOTENSIN) 40 MG tablet TAKE 1 TABLET BY MOUTH DAILY. 90 tablet 1   cholecalciferol (VITAMIN D3) 25 MCG (1000 UNIT) tablet Take 1,000 Units by mouth daily.     metoprolol succinate (TOPROL-XL) 50 MG 24 hr tablet TAKE 1 TABLET BY MOUTH DAILY. TAKE WITH OR IMMEDIATELY FOLLOWING A MEAL. 90 tablet 1   pantoprazole (PROTONIX) 40 MG tablet TAKE 1 TABLET BY MOUTH ONCE DAILY AS NEEDED FOR ACID REFLUX 90 tablet 0   simvastatin (ZOCOR) 10 MG tablet TAKE 1 TABLET BY MOUTH AT BEDTIME. 90 tablet 1   spironolactone (ALDACTONE) 25 MG tablet TAKE 1/2 TABLET BY MOUTH DAILY. DUE FOR OFFICE VISIT 45 tablet 1   vitamin B-12 (CYANOCOBALAMIN) 1000 MCG tablet Take 1,000 mcg by mouth daily.     warfarin (COUMADIN) 5 MG tablet Take 1/2 tablet daily except take 1 tablet on Mon and Thurs or take as directed by anticoagulation clinic 90 tablet 1   No facility-administered medications prior to visit.       Objective:   Physical Exam:  General appearance: 80 y.o.,  female, NAD, conversant  Eyes: anicteric sclerae, moist conjunctivae; no lid-lag; PERRL, tracking appropriately HENT: NCAT; oropharynx, MMM, no mucosal ulcerations; normal hard and soft palate Neck: Trachea midline; no lymphadenopathy, no JVD Lungs: CTAB, no crackles, no wheeze, with normal respiratory effort CV: RRR, no MRGs  Abdomen: Soft, non-tender; non-distended, BS present  Extremities: 2+ edema LLE, 1+ edema RLE, radial and DP pulses present bilaterally  Skin: Normal temperature, turgor and texture; no rash Psych: Appropriate affect Neuro: Alert and oriented to person and place, no focal deficit    Vitals:   11/01/20 1336  BP: 120/62  Pulse: (!) 53  Temp: 98 F (36.7 C)  TempSrc: Oral  SpO2: 96%  Weight: 171 lb 3.2 oz (77.7 kg)  Height: '5\' 1"'$  (1.549 m)   96% on RA BMI Readings from Last 3 Encounters:  11/01/20 32.35 kg/m  10/08/20 31.55 kg/m  09/26/20 31.74 kg/m   Wt Readings from Last 3 Encounters:  11/01/20 171 lb 3.2 oz (77.7 kg)  10/08/20 167 lb (75.8 kg)  09/26/20 168 lb (76.2 kg)     CBC    Component Value Date/Time   WBC 4.3 09/26/2020 1138   RBC 4.57 09/26/2020 1138   HGB 13.5 09/26/2020 1138   HCT 40.2 09/26/2020 1138   PLT 178.0 09/26/2020 1138   MCV 88.0 09/26/2020 1138   MCH 29.9 03/18/2019 1858   MCHC 33.7 09/26/2020 1138   RDW 13.1 09/26/2020 1138   LYMPHSABS 1.0 09/26/2020 1138   MONOABS 0.4 09/26/2020 1138   EOSABS 0.0 09/26/2020 1138   BASOSABS 0.0 09/26/2020 1138    Borderline elevated Ca  No remarkable eosinophilia  Chest Imaging: CT A/P 08/19/19 reviewed by me and remarkable only for for small hiatal hernia.  Pulmonary Functions Testing Results: No flowsheet data  found.  FeNO: None  Echocardiogram:    10/09/20 1. Left ventricular ejection fraction, by estimation, is 60 to 65%. The  left ventricle has normal function. The left ventricle has no regional  wall motion abnormalities. Left ventricular diastolic parameters  are  consistent with Grade II diastolic  dysfunction (pseudonormalization).   2. Right ventricular systolic function is normal. The right ventricular  size is mildly enlarged. There is normal pulmonary artery systolic  pressure. The estimated right ventricular systolic pressure is [id number] mmHg.   3. Left atrial size was mildly dilated.   4. The mitral valve is normal in structure. Mild mitral valve  regurgitation.   Heart Catheterization: None    Assessment & Plan:   # DOE: Very episodic, short (<86mn or so), self-limited makes asthma or parenchymal issue seem unlikely. To me this history sounds more concerning for arrhythmia. Deconditioning, decompensated diastolic dysfunction ([id number] TTE) together with PH not detected on echo possibly contributory although episodic nature would be odd and she has only trace edema on exam, no orthopnea.   # Facial flushing: wonder if it could be manifestation of horner syndrome referable to cavernous sinus mass although historically her flushing has been bilateral. No flushing today on exam.   # Cavernous sinus mass: wonder if there could be some kind of endocrinologic issue contributing to her symptoms. TSH in past WNL. Otherwise no prior pituitary axis testing.   Plan: - SArlyce Harmanpre/post - CT Chest given new dyspnea in setting of what could be systemic manifestation of possible CNS granulomatous process - Will discuss holter with PCP, cardiology     NMaryjane Hurter MD [facility] 11/01/2020 1:42 PM

## 2020-11-01 ENCOUNTER — Other Ambulatory Visit: Payer: Self-pay

## 2020-11-01 ENCOUNTER — Ambulatory Visit: Payer: Medicare Other

## 2020-11-01 ENCOUNTER — Encounter: Payer: Self-pay | Admitting: Student

## 2020-11-01 ENCOUNTER — Institutional Professional Consult (permissible substitution): Payer: Medicare Other | Admitting: Student

## 2020-11-01 ENCOUNTER — Ambulatory Visit (INDEPENDENT_AMBULATORY_CARE_PROVIDER_SITE_OTHER): Payer: Medicare Other | Admitting: General Practice

## 2020-11-01 ENCOUNTER — Ambulatory Visit: Payer: Medicare Other | Admitting: Student

## 2020-11-01 VITALS — BP 120/62 | HR 53 | Temp 98.0°F | Ht 61.0 in | Wt 171.2 lb

## 2020-11-01 DIAGNOSIS — Z86718 Personal history of other venous thrombosis and embolism: Secondary | ICD-10-CM

## 2020-11-01 DIAGNOSIS — Z7901 Long term (current) use of anticoagulants: Secondary | ICD-10-CM | POA: Diagnosis not present

## 2020-11-01 DIAGNOSIS — R0600 Dyspnea, unspecified: Secondary | ICD-10-CM

## 2020-11-01 LAB — POCT INR: INR: 2.6 (ref 2.0–3.0)

## 2020-11-01 NOTE — Addendum Note (Signed)
Addended by: Lorretta Harp on: 11/01/2020 03:04 PM   Modules accepted: Orders

## 2020-11-01 NOTE — Patient Instructions (Signed)
Pre visit review using our clinic review tool, if applicable. No additional management support is needed unless otherwise documented below in the visit note.  Continue to take 1/2 tablet daily except 1 tablet on Mondays .    Re-check in to 6 weeks.

## 2020-11-01 NOTE — Patient Instructions (Signed)
-   I'll reach out to your primary care doctor and cardiologist to discuss role of holter monitor to check heart rhythm - While unlikely to reveal underlying issue, I have ordered breathing tests and CT Chest  - Let's meet in 3 months to discuss how you're doing and the results of above testing

## 2020-11-01 NOTE — Progress Notes (Signed)
Agree with management.  Lossie Kalp J Frimet Durfee, MD  

## 2020-11-02 ENCOUNTER — Telehealth: Payer: Self-pay | Admitting: Internal Medicine

## 2020-11-02 NOTE — Telephone Encounter (Signed)
SibleyCan you call her.  She just saw pulmonary recently and he thinks that she have a heart arrhythmia that could be causing her symptoms.  I know she has seen cardiology and has a follow-up with with them, but I could order a Holter monitor that she would wear for 2 weeks to see if she does have any arrhythmia.  Let me know her thoughts

## 2020-11-02 NOTE — Telephone Encounter (Signed)
Noted  

## 2020-11-05 ENCOUNTER — Encounter: Payer: Self-pay | Admitting: Cardiology

## 2020-11-05 ENCOUNTER — Telehealth: Payer: Self-pay | Admitting: Student

## 2020-11-05 ENCOUNTER — Ambulatory Visit: Payer: Medicare Other | Admitting: Cardiology

## 2020-11-05 ENCOUNTER — Other Ambulatory Visit: Payer: Self-pay

## 2020-11-05 VITALS — BP 140/64 | HR 43 | Ht 61.0 in | Wt 167.0 lb

## 2020-11-05 DIAGNOSIS — R0600 Dyspnea, unspecified: Secondary | ICD-10-CM | POA: Diagnosis not present

## 2020-11-05 DIAGNOSIS — I10 Essential (primary) hypertension: Secondary | ICD-10-CM

## 2020-11-05 DIAGNOSIS — I5189 Other ill-defined heart diseases: Secondary | ICD-10-CM

## 2020-11-05 DIAGNOSIS — E7800 Pure hypercholesterolemia, unspecified: Secondary | ICD-10-CM

## 2020-11-05 DIAGNOSIS — R0609 Other forms of dyspnea: Secondary | ICD-10-CM

## 2020-11-05 MED ORDER — SPIRONOLACTONE 25 MG PO TABS: 25.0000 mg | ORAL_TABLET | Freq: Every day | ORAL | 1 refills | Status: DC

## 2020-11-05 NOTE — Telephone Encounter (Signed)
Left detailed message for the patient she is scheduled at [place] location arrive at 12:00pm

## 2020-11-05 NOTE — Patient Instructions (Signed)
Medication Instructions:   Your physician has recommended you make the following change in your medication:    INCREASE your Aldactone to 25 MG once a day.  2.    DECREASE your Metoprolol to 25 MG (0.5 tab) once a day for one week, then stop.  *If you need a refill on your cardiac medications before your next appointment, please call your pharmacy*   Lab Work: None ordered If you have labs (blood work) drawn today and your tests are completely normal, you will receive your results only by: [place] (if you have MyChart) OR A paper copy in the mail If you have any lab test that is abnormal or we need to change your treatment, we will call you to review the results.   Testing/Procedures: None ordered   Follow-Up: At [facility], you and your health needs are our priority.  As part of our continuing mission to provide you with exceptional heart care, we have created designated Provider Care Teams.  These Care Teams include your primary Cardiologist (physician) and Advanced Practice Providers (APPs -  Physician Assistants and Nurse Practitioners) who all work together to provide you with the care you need, when you need it.  We recommend signing up for the patient portal called "MyChart".  Sign up information is provided on this After Visit Summary.  MyChart is used to connect with patients for Virtual Visits (Telemedicine).  Patients are able to view lab/test results, encounter notes, upcoming appointments, etc.  Non-urgent messages can be sent to your provider as well.   To learn more about what you can do with MyChart, go to [email or web address].    Your next appointment:   6 week(s)  The format for your next appointment:   In Person  Provider:   Kate Sable, MD  ONLY   Other Instructions

## 2020-11-05 NOTE — Progress Notes (Signed)
Cardiology Office Note:    Date:  11/05/2020   ID:  JANYA SURTI, DOB Aug 22, 1940, MRN [id number]  PCP:  [name], MD   [place] Providers Cardiologist:  Kate Sable, MD     Referring MD: [name], MD   Chief Complaint  Patient presents with   Other    1 month f/u c/o feeling exhausted. Meds reviewed verbally with pt.    History of Present Illness:    VIRGIA TACK is a 80 y.o. 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  Previously seen due to shortness of breath sometimes associated with exertion.  Echocardiogram and Lexiscan Myoview were ordered to evaluate any significant cardiac abnormalities.  She still has occasional fatigue and shortness of breath.  Recently saw pulmonary medicine, EKG obtained showing sinus bradycardia heart rate 49.  Pulse today in the office was 43 bpm.  Prior notes Echo [id number] normal systolic function, impaired relaxation, EF 55 to 60%  Past Medical History:  Diagnosis Date   Anticoagulated on Coumadin 2001   2 blood clots   Bruises easily    pt is on Coumadin;last dose of Coumadin 08/05/11 and then Lovenox started 08/07/11   DVT (deep vein thrombosis) in pregnancy     1965 post C section;2001 with prolonged driving while on  HRT   DVT (deep venous thrombosis) ([place]) 2001   Was on Prempro.     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 Gery Pray   Homocysteinemia 01/23/2015   Hx of colonic polyps    Dr Sharlett Iles   Hx of migraines    yrs ago    Hypertension    takes Benazepril,Amlodipine,and Metoprolol daily   Osteoporosis    PONV (postoperative nausea and vomiting)    Primary localized  osteoarthritis of left knee    PVD (peripheral vascular disease) ([place]) 1965, 2001   abnormal venous doppler findings due to recurrent DVT'S left leg   Right knee DJD    knees   Shortness of breath dyspnea    Vitamin B12 deficiency (non anemic) 01/24/2015   Vitamin D deficiency    takes VIt D daily    Past Surgical History:  Procedure Laterality Date   [place]  1961/1965/1966   X 3   CHOLECYSTECTOMY  1997   COLONOSCOPY W/ POLYPECTOMY  2007   Dr  Sharlett Iles; due? 2012   [place]  2012   uterine polyp, Dr Kennon Rounds   ESOPHAGOGASTRODUODENOSCOPY  1997   HYSTEROSCOPY WITH D & C N/A 08/05/2012   Procedure: DILATATION AND CURETTAGE /HYSTEROSCOPY;  Surgeon: Donnamae Jude, MD;  Location: [facility];  Service: Gynecology;  Laterality: N/A;   ROBOTIC ASSISTED TOTAL HYSTERECTOMY WITH BILATERAL SALPINGO OOPHERECTOMY Right 09/14/2012   Procedure: ROBOTIC ASSISTED TOTAL HYSTERECTOMY WITH BILATERAL SALPINGO OOPHORECTOMY ,right pelvic LYMPHNODE dissection;  Surgeon: Imagene Gurney A. Alycia Rossetti, MD;  Location: [facility];  Service: Gynecology;  Laterality: Right;   TOTAL KNEE ARTHROPLASTY  08/11/2011   Procedure: TOTAL KNEE ARTHROPLASTY;  Surgeon: Lorn Junes, MD;  Location: [facility]  OR;  Service: Orthopedics;  Laterality: Right;  DR Noemi Chapel WANTS 90 MINUTES FOR THIS CASE   TOTAL KNEE ARTHROPLASTY Left 09/11/2014   TOTAL KNEE ARTHROPLASTY Left 09/11/2014   Procedure: TOTAL KNEE ARTHROPLASTY;  Surgeon: Elsie Saas, MD;  Location: [facility];  Service: Orthopedics;  Laterality: Left;   WISDOM TOOTH EXTRACTION      Current Medications: Current Meds  Medication Sig   acetaminophen (TYLENOL) 500 MG tablet Take 500 mg by mouth every 6 (six) hours as needed.   amLODipine (NORVASC) 5 MG tablet TAKE 1 TABLET BY MOUTH DAILY. TAKE AN EXTRA TABLET IF BLOOD PRESSURE IS GREATER THAN 145/90   benazepril (LOTENSIN) 40 MG tablet TAKE 1  TABLET BY MOUTH DAILY.   cholecalciferol (VITAMIN D3) 25 MCG (1000 UNIT) tablet Take 1,000 Units by mouth daily.   pantoprazole (PROTONIX) 40 MG tablet TAKE 1 TABLET BY MOUTH ONCE DAILY AS NEEDED FOR ACID REFLUX   simvastatin (ZOCOR) 10 MG tablet TAKE 1 TABLET BY MOUTH AT BEDTIME.   vitamin B-12 (CYANOCOBALAMIN) 1000 MCG tablet Take 1,000 mcg by mouth daily.   warfarin (COUMADIN) 5 MG tablet Take 1/2 tablet daily except take 1 tablet on Mon and Thurs or take as directed by anticoagulation clinic   [DISCONTINUED] metoprolol succinate (TOPROL-XL) 50 MG 24 hr tablet TAKE 1 TABLET BY MOUTH DAILY. TAKE WITH OR IMMEDIATELY FOLLOWING A MEAL.   [DISCONTINUED] spironolactone (ALDACTONE) 25 MG tablet TAKE 1/2 TABLET BY MOUTH DAILY. DUE FOR OFFICE VISIT     Allergies:   Vicodin [hydrocodone-acetaminophen], Aspirin, Atorvastatin, and Hctz [hydrochlorothiazide]   Social History   Socioeconomic History   Marital status: Divorced    Spouse name: Not on file   Number of children: 2   Years of education: Not on file   Highest education level: High school graduate  Occupational History   Occupation: Retired  Tobacco Use   Smoking status: Never   Smokeless tobacco: Never  Vaping Use   Vaping Use: Never used  Substance and Sexual Activity   Alcohol use: No   Drug use: No   Sexual activity: Not Currently    Birth control/protection: Post-menopausal    Comment: retired. has 2 daughters  Other Topics Concern   Not on file  Social History Narrative   Pt lives alone she has 2 daughter - right handed- drinks tea, soda sometimes - No regular exercise   Social Determinants of Health   Financial Resource Strain: Low Risk    Difficulty of Paying Living Expenses: Not hard at all  Food Insecurity: No Food Insecurity   Worried About Charity fundraiser in the Last Year: Never true   Arboriculturist in the Last Year: Never true  Transportation Needs: Not on file  Physical Activity: Sufficiently Active    Days of Exercise per Week: 5 days   Minutes of Exercise per Session: 30 min  Stress: No Stress Concern Present   Feeling of Stress : Not at all  Social Connections: Moderately Integrated   Frequency of Communication with Friends and Family: More than three times a week   Frequency of Social Gatherings with Friends and Family: More than three times a week   Attends Religious Services: More than 4 times per year   Active Member of [employer] or Organizations: Yes   Attends Archivist Meetings: More than 4 times per year   Marital Status: Widowed     Family History: The patient's family history includes Cancer in her father;  Colon cancer in her maternal grandmother; Deep vein thrombosis in her mother; Diabetes in an other family member; Hypertension in her mother and sister; Kidney disease in her mother; Leukemia in her father. There is no history of Anesthesia problems, Hypotension, Malignant hyperthermia, Pseudochol deficiency, Stroke, Heart disease, Breast cancer, Ovarian cancer, or Uterine cancer.  ROS:   Please see the history of present illness.     All other systems reviewed and are negative.  EKGs/Labs/Other Studies Reviewed:    The following studies were reviewed today:   EKG:  EKG is not ordered today.    ECG from PCP office obtained 09/26/2020 showed sinus bradycardia, right bundle branch block.  Recent Labs: 03/08/2020: TSH 1.48 09/26/2020: ALT 8; BUN 15; Creatinine, Ser 0.99; Hemoglobin 13.5; Platelets 178.0; Potassium 4.4; Sodium 139  Recent Lipid Panel    Component Value Date/Time   CHOL 153 09/03/2020 1041   CHOL 178 05/25/2014 1121   TRIG 142.0 09/03/2020 1041   TRIG 138 05/25/2014 1121   HDL 60.10 09/03/2020 1041   HDL 76 05/25/2014 1121   CHOLHDL 3 09/03/2020 1041   VLDL 28.4 09/03/2020 1041   LDLCALC 64 09/03/2020 1041   LDLCALC 74 05/25/2014 1121     Risk Assessment/Calculations:          Physical Exam:    VS:  BP 140/64 (BP Location: Left  Arm, Patient Position: Sitting, Cuff Size: Normal)   Pulse (!) 43   Ht '5\' 1"'$  (1.549 m)   Wt 167 lb (75.8 kg)   SpO2 98%   BMI 31.55 kg/m     Wt Readings from Last 3 Encounters:  11/05/20 167 lb (75.8 kg)  11/01/20 171 lb 3.2 oz (77.7 kg)  10/08/20 167 lb (75.8 kg)     GEN:  Well nourished, well developed in no acute distress HEENT: Normal NECK: No JVD; No carotid bruits LYMPHATICS: No lymphadenopathy CARDIAC: RRR, no murmurs, rubs, gallops RESPIRATORY:  Clear to auscultation without rales, wheezing or rhonchi  ABDOMEN: Soft, non-tender, non-distended MUSCULOSKELETAL:  No edema; No deformity  SKIN: Warm and dry NEUROLOGIC:  Alert and oriented x 3 PSYCHIATRIC:  Normal affect   ASSESSMENT:    1. Dyspnea on exertion   2. Diastolic dysfunction   3. Primary hypertension   4. Pure hypercholesterolemia     PLAN:    In order of problems listed above:  Dyspnea on exertion, risk factors hypertension, hyperlipidemia.  Echo shows normal systolic function, grade 2 diastolic dysfunction, EF 60 to 65%.  Lexiscan Myoview with no evidence for ischemia.  Patient appears euvolemic.  Bradycardia noted.  Unsure if beta-blocker/metoprolol is contributing.  We will titrate off metoprolol over the next week. Diastolic dysfunction, euvolemic.  Spironolactone 25 mg daily. Hypertension, BP elevated.  Titrate off beta-blocker as above.  Increase Aldactone to 25 mg daily.  Continue Norvasc, benazepril as prescribed. Hyperlipidemia, cholesterol controlled.  Continue statin.  Follow-up in 6 weeks      Medication Adjustments/Labs and Tests Ordered: Current medicines are reviewed at length with the patient today.  Concerns regarding medicines are outlined above.  No orders of the defined types were placed in this encounter.  Meds ordered this encounter  Medications   spironolactone (ALDACTONE) 25 MG tablet    Sig: Take 1 tablet (25 mg total) by mouth daily.    Dispense:  90 tablet    Refill:   1    Dose increase     Patient Instructions  Medication Instructions:   Your physician has  recommended you make the following change in your medication:    INCREASE your Aldactone to 25 MG once a day.  2.    DECREASE your Metoprolol to 25 MG (0.5 tab) once a day for one week, then stop.  *If you need a refill on your cardiac medications before your next appointment, please call your pharmacy*   Lab Work: None ordered If you have labs (blood work) drawn today and your tests are completely normal, you will receive your results only by: [place] (if you have MyChart) OR A paper copy in the mail If you have any lab test that is abnormal or we need to change your treatment, we will call you to review the results.   Testing/Procedures: None ordered   Follow-Up: At [facility], you and your health needs are our priority.  As part of our continuing mission to provide you with exceptional heart care, we have created designated Provider Care Teams.  These Care Teams include your primary Cardiologist (physician) and Advanced Practice Providers (APPs -  Physician Assistants and Nurse Practitioners) who all work together to provide you with the care you need, when you need it.  We recommend signing up for the patient portal called "MyChart".  Sign up information is provided on this After Visit Summary.  MyChart is used to connect with patients for Virtual Visits (Telemedicine).  Patients are able to view lab/test results, encounter notes, upcoming appointments, etc.  Non-urgent messages can be sent to your provider as well.   To learn more about what you can do with MyChart, go to [email or web address].    Your next appointment:   6 week(s)  The format for your next appointment:   In Person  Provider:   Kate Sable, MD  ONLY   Other Instructions    Signed, Kate Sable, MD  11/05/2020 4:40 PM    [place]

## 2020-11-07 ENCOUNTER — Other Ambulatory Visit: Payer: Self-pay

## 2020-11-07 ENCOUNTER — Telehealth: Payer: Self-pay | Admitting: Student

## 2020-11-07 ENCOUNTER — Ambulatory Visit: Payer: Medicare Other | Admitting: Student

## 2020-11-07 DIAGNOSIS — R0600 Dyspnea, unspecified: Secondary | ICD-10-CM

## 2020-11-07 LAB — PULMONARY FUNCTION TEST
FEF 25-75 Post: 2.77 L/sec
FEF 25-75 Pre: 2.85 L/sec
FEF2575-%Change-Post: -2 %
FEF2575-%Pred-Post: 223 %
FEF2575-%Pred-Pre: 230 %
FEV1-%Change-Post: 1 %
FEV1-%Pred-Post: 127 %
FEV1-%Pred-Pre: 125 %
FEV1-Post: 2.05 L
FEV1-Pre: 2.01 L
FEV1FVC-%Change-Post: 0 %
FEV1FVC-%Pred-Pre: 118 %
FEV6-%Change-Post: 1 %
FEV6-%Pred-Post: 113 %
FEV6-%Pred-Pre: 112 %
FEV6-Post: 2.33 L
FEV6-Pre: 2.3 L
FEV6FVC-%Pred-Post: 106 %
FEV6FVC-%Pred-Pre: 106 %
FVC-%Change-Post: 1 %
FVC-%Pred-Post: 107 %
FVC-%Pred-Pre: 105 %
FVC-Post: 2.33 L
FVC-Pre: 2.3 L
Post FEV1/FVC ratio: 88 %
Post FEV6/FVC ratio: 100 %
Pre FEV1/FVC ratio: 87 %
Pre FEV6/FVC Ratio: 100 %

## 2020-11-07 NOTE — Progress Notes (Signed)
Spirometry pre and post done today. 

## 2020-11-07 NOTE — Telephone Encounter (Signed)
Pt scheduled'@GI'$  11/21/20 for her chest ct Bonnie Zhang

## 2020-11-07 NOTE — Telephone Encounter (Signed)
Reviewed results of PFT today with the patient revealing normal spirometry without bronchodilator response. All questions addressed.

## 2020-11-14 ENCOUNTER — Ambulatory Visit: Payer: Medicare Other | Admitting: Internal Medicine

## 2020-11-17 ENCOUNTER — Telehealth: Payer: Self-pay | Admitting: Nurse Practitioner

## 2020-11-17 NOTE — Telephone Encounter (Signed)
   Recently weaned off of metoprolol 2/2 fatigue and bradycardia.  Since, BP has been trending in the 150's and she has occasionally noted elevated HRs in the 90's.  Resting HRs are sometimes still in the 40's.  She still notes fatigue.  Re: BP, I rec that she increase her amlodipine to '5mg'$  x 2 tabs daily w/ the expectation that BP should improve over the next 2 wks.  Re: HR elevations, we discussed that abrupt rises in HR w/o provocation/activity, may require further monitoring.  I will forward this message to our office nsg team to contact pt early next week to re-eval elevated HRs and place 14 day zio monitor if appropriate.  Caller verbalized understanding and was grateful for the call back.  Murray Hodgkins, NP 11/17/2020, 8:14 AM

## 2020-11-20 MED ORDER — AMLODIPINE BESYLATE 10 MG PO TABS: 10.0000 mg | ORAL_TABLET | Freq: Every day | ORAL | 3 refills | Status: DC

## 2020-11-20 NOTE — Addendum Note (Signed)
Addended by: Kavin Leech on: 11/20/2020 02:54 PM   Modules accepted: Orders

## 2020-11-20 NOTE — Telephone Encounter (Signed)
Called patient back as requested by Ignacia Bayley in the following:  Theora Gianotti, NP  You 3 days ago   Fyi - pt of [facility].  Would you pls touch base on Mon/Tues to see if HRs still elevating? She likely needs a zio x 14 days.    Patient stated that she has been feeling much better and her BP this morning was 116/68. Her heart rate was 48, patient states that this is normal for her.  Patient confirmed that she will be here for her follow up appointment on 12/21/20. She was very grateful for the follow up. Prescription sent in for increased dose of Amlodipine.

## 2020-11-20 NOTE — Progress Notes (Signed)
Radiation Oncology         [phone] ________________________________  Name: Bonnie Zhang MRN: [id number]  Date: 11/22/2020  DOB: August 31, 1940  Follow-Up Visit Note  CC: [name], MD  [name], MD    ICD-10-CM   1. 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 Since her last visit, the patient followed up with Dr. Berline Lopes on 08/30/20. During which time, the patient was noted to be doing well overall and using her dilator about every other night.  The patient continued to report increased urination at night, which is unchanged for her.  She noted that her bowel function is more or less back to normal although different than her baseline.  She denied any significant change in weight and reported a good appetite without nausea or emesis. The patient exhibited no evidence of disease from examination performed.  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 Other considerations noted include neoplasm such as meningioma or schwannoma, and non-neoplastic etiologies such as pseudotumor and granulomatous processes. Malignancy (including lymphoma and metastatic disease) were noted as unlikely given mass stability since 2020.   The patient met with Dr. Mickeal Skinner on 08/14/20 for  follow-up in regards to recent MRI. During this visit, the patient was noted to exhibit no new or progressive neurologic deficits, seizures, or headaches. Dr. Mickeal Skinner recommended she return, or repeat CNS imaging only if neurologic symptoms present themselves.             The patient presented with significant fatigue in June and has since undergone cardiac and lung work-up.  She did undergo a chest CT scan earlier this week which showed no significant abnormalities but did notice some asymmetry in the breast region and recommended she proceed with getting a mammogram scheduled.  Patient will call the [facility] breast center where she gets her mammograms to get this set up.        She denies any pelvic pain abdominal bloating vaginal bleeding or discharge.  She reports that her diarrhea has resolved at this time.  She is using her vaginal dilator every other day.      Allergies:  is allergic to vicodin [hydrocodone-acetaminophen], aspirin, atorvastatin, and hctz [hydrochlorothiazide].  Meds: Current Outpatient Medications  Medication Sig Dispense Refill   acetaminophen (TYLENOL) 500 MG tablet Take 500 mg by mouth every 6 (six) hours as needed.     amLODipine (NORVASC) 10 MG tablet Take 1 tablet (10 mg total) by mouth daily. 30 tablet 3   benazepril (LOTENSIN) 40 MG tablet TAKE 1 TABLET BY MOUTH DAILY. 90 tablet 1   cholecalciferol (VITAMIN D3) 25 MCG (1000 UNIT) tablet Take 1,000 Units by mouth daily.     pantoprazole (PROTONIX) 40 MG tablet TAKE 1 TABLET BY MOUTH ONCE DAILY AS NEEDED FOR ACID REFLUX 90 tablet 0  simvastatin (ZOCOR) 10 MG tablet TAKE 1 TABLET BY MOUTH AT BEDTIME. 90 tablet 1   spironolactone (ALDACTONE) 25 MG tablet Take 1 tablet (25 mg total) by mouth daily. 90 tablet 1   vitamin B-12 (CYANOCOBALAMIN) 1000 MCG tablet Take 1,000 mcg by mouth daily.     warfarin (COUMADIN) 5 MG tablet Take 1/2 tablet daily except take 1 tablet on Mon and Thurs or take as directed by anticoagulation  clinic 90 tablet 1   No current facility-administered medications for this encounter.    Physical Findings: The patient is in no acute distress. Patient is alert and oriented.  height is 5' 1"  (1.549 m) and weight is 163 lb 8 oz (74.2 kg). Her temperature is 97.7 F (36.5 C). Her blood pressure is 148/43 (abnormal) and her pulse is 43 (abnormal). Her respiration is 18 and oxygen saturation is 99%. . Lungs are clear to auscultation bilaterally. Heart has regular rate and rhythm. No palpable cervical, supraclavicular, or axillary adenopathy. Abdomen soft, non-tender, normal bowel sounds.  On pelvic examination the external genitalia were unremarkable. A speculum exam was performed. There are no mucosal lesions noted in the vaginal vault.  The vaginal vault is narrowed but does permit index finger examination.  on bimanual examination there are no pelvic masses appreciated.  Lab Findings: Lab Results  Component Value Date   WBC 4.3 09/26/2020   HGB 13.5 09/26/2020   HCT 40.2 09/26/2020   MCV 88.0 09/26/2020   PLT 178.0 09/26/2020    Radiographic Findings: CT Chest Wo Contrast  Result Date: 11/21/2020 CLINICAL DATA:  Brain/CNS neoplasm, staging, possible granulomatous CNS lesion, new episodic dyspnea, shortness of breath since June 2022, fatigue, history of vaginal cancer post radiation therapy, endometrioid adenocarcinoma of the uterus, EXAM: CT CHEST WITHOUT CONTRAST TECHNIQUE: Multidetector CT imaging of the chest was performed following the standard protocol without IV contrast. Sagittal and coronal MPR images reconstructed from axial data set. COMPARISON:  None FINDINGS: Cardiovascular: Atherosclerotic calcifications aorta, proximal great vessels, minimally in coronary arteries. Aorta normal caliber. Heart unremarkable. No pericardial effusion. Mediastinum/Nodes: Tiny hiatal hernia. Esophagus unremarkable. No thoracic adenopathy. Base of cervical region normal appearance. Asymmetric density  outer LEFT breast 15 x 5 x 4 mm image 58. Lungs/Pleura: Lungs clear. No pulmonary infiltrate, pleural effusion, or pneumothorax. Upper Abdomen: Gallbladder surgically absent. 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 Few calcified granulomata within spleen. Musculoskeletal: Osseous demineralization. IMPRESSION: No acute intrathoracic abnormalities. Tiny hiatal hernia. 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 Asymmetric density outer LEFT breast 15 x 5 x 4 mm, nonspecific; correlation with diagnostic mammography recommended with ultrasound as mammographically directed. Aortic Atherosclerosis (ICD10-I70.0). These results will be called to the ordering clinician or representative by the Radiologist Assistant, and communication documented in the PACS or [employer]. Electronically Signed   By: Lavonia Dana M.D.   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   Plan: Patient will follow up with Dr. Berline Lopes in 3 months.  She is scheduled for follow-up in radiation oncology in 6 months.   23 minutes of total time was spent for this patient encounter, including preparation, face-to-face counseling with the patient and coordination of care, physical exam, and documentation of the encounter. ____________________________________  Blair Promise, PhD, MD   This document serves as a record  of services personally performed by Gery Pray, MD. It was created on his behalf by Roney Mans, a trained medical scribe. The creation of this record is based on the scribe's personal observations and the provider's statements to them. This document has been checked and approved by the attending provider.

## 2020-11-21 ENCOUNTER — Other Ambulatory Visit: Payer: Self-pay

## 2020-11-21 ENCOUNTER — Telehealth: Payer: Self-pay | Admitting: *Deleted

## 2020-11-21 ENCOUNTER — Ambulatory Visit
Admission: RE | Admit: 2020-11-21 | Discharge: 2020-11-21 | Disposition: A | Discharge status: Home / Self Care | Payer: Medicare Other | Source: Ambulatory Visit | Attending: Student | Admitting: Student

## 2020-11-21 DIAGNOSIS — I700 Atherosclerosis of aorta: Secondary | ICD-10-CM | POA: Diagnosis not present

## 2020-11-21 DIAGNOSIS — R0600 Dyspnea, unspecified: Secondary | ICD-10-CM

## 2020-11-21 NOTE — Telephone Encounter (Signed)
CALLED PATIENT TO INFORM OF FU APPT. FOR 11-22-20 BEING MOVED TO 11:30 AM , SPOKE WITH PATIENT AND SHE VERIFIED THAT WAS OK

## 2020-11-22 ENCOUNTER — Encounter: Payer: Self-pay | Admitting: Radiation Oncology

## 2020-11-22 ENCOUNTER — Telehealth: Payer: Self-pay | Admitting: Student

## 2020-11-22 ENCOUNTER — Ambulatory Visit
Admission: RE | Admit: 2020-11-22 | Discharge: 2020-11-22 | Disposition: A | Discharge status: Home / Self Care | Payer: Medicare Other | Source: Ambulatory Visit | Attending: Radiation Oncology | Admitting: Radiation Oncology

## 2020-11-22 VITALS — BP 148/43 | HR 43 | Temp 97.7°F | Resp 18 | Ht 61.0 in | Wt 163.5 lb

## 2020-11-22 DIAGNOSIS — R220 Localized swelling, mass and lump, head: Secondary | ICD-10-CM | POA: Diagnosis not present

## 2020-11-22 DIAGNOSIS — K449 Diaphragmatic hernia without obstruction or gangrene: Secondary | ICD-10-CM | POA: Insufficient documentation

## 2020-11-22 DIAGNOSIS — R5383 Other fatigue: Secondary | ICD-10-CM | POA: Diagnosis not present

## 2020-11-22 DIAGNOSIS — Z923 Personal history of irradiation: Secondary | ICD-10-CM | POA: Insufficient documentation

## 2020-11-22 DIAGNOSIS — Z8542 Personal history of malignant neoplasm of other parts of uterus: Secondary | ICD-10-CM | POA: Insufficient documentation

## 2020-11-22 DIAGNOSIS — Z79899 Other long term (current) drug therapy: Secondary | ICD-10-CM | POA: Insufficient documentation

## 2020-11-22 DIAGNOSIS — I700 Atherosclerosis of aorta: Secondary | ICD-10-CM | POA: Insufficient documentation

## 2020-11-22 DIAGNOSIS — Z08 Encounter for follow-up examination after completed treatment for malignant neoplasm: Secondary | ICD-10-CM | POA: Diagnosis not present

## 2020-11-22 DIAGNOSIS — Z7901 Long term (current) use of anticoagulants: Secondary | ICD-10-CM | POA: Diagnosis not present

## 2020-11-22 DIAGNOSIS — C541 Malignant neoplasm of endometrium: Secondary | ICD-10-CM

## 2020-11-22 DIAGNOSIS — R9389 Abnormal findings on diagnostic imaging of other specified body structures: Secondary | ICD-10-CM

## 2020-11-22 NOTE — Telephone Encounter (Signed)
Called and spoke with patient to let her know of message from Dr. Verlee Monte about CT scan. Advised patient that he is going to order a mammogram and also forward this to her PCP to make her aware. Patient expressed understanding. Advised her that she would get a call from [facility] to get it scheduled. Nothing further needed at this time.

## 2020-11-22 NOTE — Progress Notes (Signed)
Bonnie Zhang is here today for follow up post radiation to the pelvic.  They completed their radiation on: 11/23/2019   Does the patient complain of any of the following:  Pain:denies Abdominal bloating: denies Diarrhea/Constipation: denies Nausea/Vomiting: denies Vaginal Discharge: denies Blood in Urine or Stool: denies Urinary Issues (dysuria/incomplete emptying/ incontinence/ increased frequency/urgency): frequency, nocturia x2-3, occasional urgency Does patient report using vaginal dilator 2-3 times a week and/or sexually active 2-3 weeks: yes Post radiation skin changes: denies   Additional comments if applicable: none  Vitals:   11/22/20 1110  BP: (!) 148/43  Pulse: (!) 43  Resp: 18  Temp: 97.7 F (36.5 C)  SpO2: 99%  Weight: 163 lb 8 oz (74.2 kg)  Height: '5\' 1"'$  (1.549 m)

## 2020-11-22 NOTE — Telephone Encounter (Signed)
I called and spoke with Valarie Merino at GI regarding CT chest. I have copied over impression and will route to Dr. Verlee Monte.  Dr. Verlee Monte, please advise on call report. Thanks!   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 Sagittal and coronal MPR images reconstructed from axial data set.   COMPARISON:  None   FINDINGS: Cardiovascular: Atherosclerotic calcifications aorta, proximal great vessels, minimally in coronary arteries. Aorta normal caliber. Heart unremarkable. No pericardial effusion.   Mediastinum/Nodes: Tiny hiatal hernia. Esophagus unremarkable. No thoracic adenopathy. Base of cervical region normal appearance. Asymmetric density outer LEFT breast 15 x 5 x 4 mm image 58.   Lungs/Pleura: Lungs clear. No pulmonary infiltrate, pleural effusion, or pneumothorax.   Upper Abdomen: Gallbladder surgically absent. 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 Few calcified granulomata within spleen.   Musculoskeletal: Osseous demineralization.   IMPRESSION: No acute intrathoracic abnormalities.   Tiny hiatal hernia.   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   Asymmetric density outer LEFT breast 15 x 5 x 4 mm, nonspecific; correlation with diagnostic mammography recommended with ultrasound as mammographically directed.   Aortic Atherosclerosis (ICD10-I70.0).   These results will be called to the ordering clinician or representative by the Radiologist Assistant, and communication documented in the PACS or [employer].     Electronically Signed   By: Lavonia Dana M.D.   On: 11/21/2020 16:53

## 2020-11-22 NOTE — Telephone Encounter (Signed)
I have attempted to call the pt back but keep getting a fast busy signal.

## 2020-11-22 NOTE — Telephone Encounter (Signed)
Dr Verlee Monte has placed order but looks like pt has not been made aware of results.  I am holding order until I see that she has been spoken to.

## 2020-11-22 NOTE — Telephone Encounter (Signed)
Attempted to call with results of CT Chest. Had to leave voicemail. Lungs look fine, no enlarged lymph nodes. 1.5cm lesion seen in left breast. I will order diagnostic mammography and notify PCP.  [place]

## 2020-11-27 ENCOUNTER — Ambulatory Visit (HOSPITAL_BASED_OUTPATIENT_CLINIC_OR_DEPARTMENT_OTHER): Payer: Medicare Other | Admitting: Cardiology

## 2020-11-30 NOTE — Progress Notes (Signed)
NEUROLOGY FOLLOW UP OFFICE NOTE  Bonnie Zhang [id number]  Assessment/Plan:   Episodes of flushing and lightheadedness - TIA or other CNS etiology suspected. Right cavernous carotid lesion.    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  She has some lightheadedness but now without headache.  At first it would last 3-4 hours but now may occur off and on throughout the day.  It occurs 2 to 4 days a week.  She also started feeling weak and short of breath.  No stroke-like events.  She saw cardiology.  Echocardiogram and Lexiscan Myoview did not show any significant abnormalities.  EKG showed sinus bradycardia in the 40s.  Cardiology tapered her off of metoprolol which helped with fatigue but not the flushing.  She saw pulmonology who did not find a cause.    Since last visit, she followed up with neuro-oncology regarding the cavernous sinus mass.  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  It has been monitored.  Last MRI of brain with and without contrast on 08/10/2020 personally reviewed continues to demonstrate stability, which makes malignancy unlikely.   HISTORY: She had episode of facial flushing, lightheadedness and dull non-throbbing headache in February 2020.  She was diagnosed with vertigo.  She had another episode of facial flushing and lightheadedness and dull headache in October 2020.  However this time, she had trouble getting words out for about 3 minutes.  She checked her blood pressure, which was 161/84.  She took meclizine which helps a little bit.  No visual disturbance or unilateral numbness or weakness.  She had an MRI of  the brain with and without contrast performed on 01/24/2019, which was personally reviewed, and showed a 9 x 15 mm enhancing mass lesion in the right cavernous carotid.     She reports remote history of vertigo described as spinning.  She also reports history of headaches consistent with migraines.    PAST MEDICAL HISTORY: Past Medical History:  Diagnosis Date   Anticoagulated on Coumadin 2001   2 blood clots   Bruises easily    pt is on Coumadin;last dose of Coumadin 08/05/11 and then Lovenox started 08/07/11   DVT (deep vein thrombosis) in pregnancy     1965 post C section;2001 with prolonged driving while on  HRT   DVT (deep venous thrombosis) ([place]) 2001   Was on Prempro.     Endometrioid adenocarcinoma of uterus ([place])    GERD (gastroesophageal reflux disease)    Protonix prn   High cholesterol    takes Simvastating daily   History of colon polyps 2007   Dr Sharlett Iles   History of radiation therapy 09/06/2019-11/23/2019   IMRT Vagina/Pelvis and Vagina HDR 4 fx; Dr. Gery Pray   Homocysteinemia 01/23/2015   Hx of colonic polyps    Dr Sharlett Iles   Hx of migraines    yrs ago    Hypertension    takes Benazepril,Amlodipine,and Metoprolol daily   Osteoporosis    PONV (postoperative nausea and vomiting)    Primary localized osteoarthritis of left knee    PVD (peripheral vascular disease) ([place]) 1965, 2001   abnormal venous doppler findings due to recurrent DVT'S left  leg   Right knee DJD    knees   Shortness of breath dyspnea    Vitamin B12 deficiency (non anemic) 01/24/2015   Vitamin D deficiency    takes VIt D daily    MEDICATIONS: Current Outpatient Medications on File Prior to Visit  Medication Sig Dispense Refill   acetaminophen (TYLENOL) 500 MG tablet Take 500 mg by mouth every 6 (six) hours as needed.     amLODipine (NORVASC) 10 MG tablet Take 1 tablet (10 mg total) by mouth daily. 30 tablet 3   benazepril (LOTENSIN) 40 MG tablet TAKE 1 TABLET BY MOUTH DAILY. 90 tablet  1   cholecalciferol (VITAMIN D3) 25 MCG (1000 UNIT) tablet Take 1,000 Units by mouth daily.     pantoprazole (PROTONIX) 40 MG tablet TAKE 1 TABLET BY MOUTH ONCE DAILY AS NEEDED FOR ACID REFLUX 90 tablet 0   simvastatin (ZOCOR) 10 MG tablet TAKE 1 TABLET BY MOUTH AT BEDTIME. 90 tablet 1   spironolactone (ALDACTONE) 25 MG tablet Take 1 tablet (25 mg total) by mouth daily. 90 tablet 1   vitamin B-12 (CYANOCOBALAMIN) 1000 MCG tablet Take 1,000 mcg by mouth daily.     warfarin (COUMADIN) 5 MG tablet Take 1/2 tablet daily except take 1 tablet on Mon and Thurs or take as directed by anticoagulation clinic 90 tablet 1   No current facility-administered medications on file prior to visit.    ALLERGIES: Allergies  Allergen Reactions   Vicodin [Hydrocodone-Acetaminophen]     Nightmares   Aspirin Other (See Comments)    upset stomach   Atorvastatin Other (See Comments)    leg pain   Hctz [Hydrochlorothiazide] Other (See Comments)    FATIGUE AND EXTREMELY LOW POTASSIUM    FAMILY HISTORY: Family History  Problem Relation Age of Onset   Kidney disease Mother        ? hypertensive   Hypertension Mother    Deep vein thrombosis Mother        post ankle fracture   Leukemia Father    Cancer Father    Colon cancer Maternal Grandmother    Diabetes Other        cousins   Hypertension Sister    Anesthesia problems Neg Hx    Hypotension Neg Hx    Malignant hyperthermia Neg Hx    Pseudochol deficiency Neg Hx    Stroke Neg Hx    Heart disease Neg Hx    Breast cancer Neg Hx    Ovarian cancer Neg Hx    Uterine cancer Neg Hx       Objective:  Blood pressure 134/70, pulse (!) 45, height 5' (1.524 m), weight 161 lb (73 kg), SpO2 98 %. General: No acute distress.  Patient appears well-groomed.   Head:  Normocephalic/atraumatic Eyes:  Fundi examined but not visualized Neck: supple, no paraspinal tenderness, full range of motion Heart:  Regular rate and rhythm Lungs:  Clear to auscultation  bilaterally Back: No paraspinal tenderness Neurological Exam: alert and oriented to person, place, and time.  Speech fluent and not dysarthric, language intact.  CN II-XII intact. Bulk and tone normal, muscle strength 5/5 throughout.  Sensation to light touch intact.  Deep tendon reflexes 2+ throughout, toes downgoing.  Finger to nose testing intact.  Gait normal, Romberg negative.   Metta Clines, DO  CC: Billey Gosling, MD

## 2020-12-04 ENCOUNTER — Encounter: Payer: Self-pay | Admitting: Neurology

## 2020-12-04 ENCOUNTER — Other Ambulatory Visit: Payer: Self-pay

## 2020-12-04 ENCOUNTER — Ambulatory Visit: Payer: Medicare Other | Admitting: Neurology

## 2020-12-04 VITALS — BP 134/70 | HR 45 | Ht 60.0 in | Wt 161.0 lb

## 2020-12-04 DIAGNOSIS — R42 Dizziness and giddiness: Secondary | ICD-10-CM | POA: Diagnosis not present

## 2020-12-04 DIAGNOSIS — R232 Flushing: Secondary | ICD-10-CM | POA: Diagnosis not present

## 2020-12-04 NOTE — Progress Notes (Signed)
Subjective:    Patient ID: Bonnie Zhang, female    DOB: Jun 23, 1940, 80 y.o.   MRN: [id number]  HPI The patient is here for an acute visit.   Upper back pain - she has pain between her shoulder blades.  It started within the past month. The pain is intermittent - it may occur with walking or making her bed, but it is not consistent.  Movement does not make it worse.  No radiation of pain.  Tylenol helps a little.      Medications and allergies reviewed with patient and updated if appropriate.  Patient Active Problem List   Diagnosis Date Noted   SOB (shortness of breath) on exertion 09/26/2020   Episodic lightheadedness 09/26/2020   Fatigue 09/25/2020   Frequent urination 09/03/2020   Endometrial cancer ([place]) 08/19/2019   Brain mass 08/12/2019   TIA (transient ischemic attack) 01/03/2019   Hyperglycemia 06/29/2017   Vertigo 04/07/2017   Long term (current) use of anticoagulants 01/07/2017   Carotid artery disease ([place]) 02/01/2016   Bilateral leg edema 06/21/2015   Vitamin B12 deficiency (non anemic) 01/24/2015   Homocysteinemia 01/23/2015   Factor V Leiden carrier ([place]) 01/23/2015   Primary localized osteoarthritis of left knee    Encounter for therapeutic drug monitoring 06/07/2013   GERD (gastroesophageal reflux disease)    PVD (peripheral vascular disease) ([place])    H/O Complex endometrial hyperplasia with atypia-possible endometrial cancer. 10/09/2010   Osteoporosis 03/21/2010   History of colonic polyps 03/20/2008   Vitamin D deficiency 08/25/2007   HYPERLIPIDEMIA 123XX123   Dysmetabolic syndrome X 123XX123   Essential (primary) hypertension 03/18/2007   DVT, HX OF 07/23/2006    Current Outpatient Medications on File Prior to Visit  Medication Sig Dispense Refill   acetaminophen (TYLENOL) 500 MG tablet Take 500 mg by mouth every 6 (six) hours as needed.     amLODipine (NORVASC) 10 MG tablet Take 1 tablet (10 mg total) by mouth daily. 30 tablet 3    benazepril (LOTENSIN) 40 MG tablet TAKE 1 TABLET BY MOUTH DAILY. 90 tablet 1   cholecalciferol (VITAMIN D3) 25 MCG (1000 UNIT) tablet Take 1,000 Units by mouth daily.     pantoprazole (PROTONIX) 40 MG tablet TAKE 1 TABLET BY MOUTH ONCE DAILY AS NEEDED FOR ACID REFLUX 90 tablet 0   simvastatin (ZOCOR) 10 MG tablet TAKE 1 TABLET BY MOUTH AT BEDTIME. 90 tablet 1   spironolactone (ALDACTONE) 25 MG tablet Take 1 tablet (25 mg total) by mouth daily. 90 tablet 1   vitamin B-12 (CYANOCOBALAMIN) 1000 MCG tablet Take 1,000 mcg by mouth daily.     warfarin (COUMADIN) 5 MG tablet Take 1/2 tablet daily except take 1 tablet on Mon and Thurs or take as directed by anticoagulation clinic 90 tablet 1   No current facility-administered medications on file prior to visit.    Past Medical History:  Diagnosis Date   Anticoagulated on Coumadin 2001   2 blood clots   Bruises easily    pt is on Coumadin;last dose of Coumadin 08/05/11 and then Lovenox started 08/07/11   DVT (deep vein thrombosis) in pregnancy     1965 post C section;2001 with prolonged driving while on  HRT   DVT (deep venous thrombosis) ([place]) 2001   Was on Prempro.     Endometrioid adenocarcinoma of uterus ([place])    GERD (gastroesophageal reflux disease)    Protonix prn   High cholesterol    takes Simvastating daily  History of colon polyps 2007   Dr Sharlett Iles   History of radiation therapy 09/06/2019-11/23/2019   IMRT Vagina/Pelvis and Vagina HDR 4 fx; Dr. Gery Pray   Homocysteinemia 01/23/2015   Hx of colonic polyps    Dr Sharlett Iles   Hx of migraines    yrs ago    Hypertension    takes Benazepril,Amlodipine,and Metoprolol daily   Osteoporosis    PONV (postoperative nausea and vomiting)    Primary localized osteoarthritis of left knee    PVD (peripheral vascular disease) ([place]) 1965, 2001   abnormal venous doppler findings due to recurrent DVT'S left leg   Right knee DJD    knees   Shortness of breath dyspnea    Vitamin B12  deficiency (non anemic) 01/24/2015   Vitamin D deficiency    takes VIt D daily    Past Surgical History:  Procedure Laterality Date   [place]  1961/1965/1966   X 3   CHOLECYSTECTOMY  1997   COLONOSCOPY W/ POLYPECTOMY  2007   Dr  Sharlett Iles; due? 2012   [place]  2012   uterine polyp, Dr Kennon Rounds   ESOPHAGOGASTRODUODENOSCOPY  1997   HYSTEROSCOPY WITH D & C N/A 08/05/2012   Procedure: DILATATION AND CURETTAGE /HYSTEROSCOPY;  Surgeon: Donnamae Jude, MD;  Location: [facility];  Service: Gynecology;  Laterality: N/A;   ROBOTIC ASSISTED TOTAL HYSTERECTOMY WITH BILATERAL SALPINGO OOPHERECTOMY Right 09/14/2012   Procedure: ROBOTIC ASSISTED TOTAL HYSTERECTOMY WITH BILATERAL SALPINGO OOPHORECTOMY ,right pelvic LYMPHNODE dissection;  Surgeon: Imagene Gurney A. Alycia Rossetti, MD;  Location: [facility];  Service: Gynecology;  Laterality: Right;   TOTAL KNEE ARTHROPLASTY  08/11/2011   Procedure: TOTAL KNEE ARTHROPLASTY;  Surgeon: Lorn Junes, MD;  Location: [facility];  Service: Orthopedics;  Laterality: Right;  DR Noemi Chapel WANTS 90 MINUTES FOR THIS CASE   TOTAL KNEE ARTHROPLASTY Left 09/11/2014   TOTAL KNEE ARTHROPLASTY Left 09/11/2014   Procedure: TOTAL KNEE ARTHROPLASTY;  Surgeon: Elsie Saas, MD;  Location: [facility];  Service: Orthopedics;  Laterality: Left;   WISDOM TOOTH EXTRACTION      Social History   Socioeconomic History   Marital status: Divorced    Spouse name: Not on file   Number of children: 2   Years of education: Not on file   Highest education level: High school graduate  Occupational History   Occupation: Retired  Tobacco Use   Smoking status: Never   Smokeless tobacco: Never  Vaping Use   Vaping Use: Never used  Substance and Sexual Activity   Alcohol use: No   Drug use: No   Sexual activity: Not Currently    Birth control/protection: Post-menopausal    Comment: retired. has 2 daughters   Other Topics Concern   Not on file  Social History Narrative   Pt lives alone she has 2 daughter - right handed- drinks tea, soda sometimes - No regular exercise   Social Determinants of Health   Financial Resource Strain: Low Risk    Difficulty of Paying Living Expenses: Not hard at all  Food Insecurity: No Food Insecurity   Worried About Charity fundraiser in the Last Year: Never true   Arboriculturist in the Last Year: Never true  Transportation Needs: Not on file  Physical Activity: Sufficiently Active   Days of Exercise per Week: 5 days   Minutes of Exercise per Session:  30 min  Stress: No Stress Concern Present   Feeling of Stress : Not at all  Social Connections: Moderately Integrated   Frequency of Communication with Friends and Family: More than three times a week   Frequency of Social Gatherings with Friends and Family: More than three times a week   Attends Religious Services: More than 4 times per year   Active Member of [employer] or Organizations: Yes   Attends Archivist Meetings: More than 4 times per year   Marital Status: Widowed    Family History  Problem Relation Age of Onset   Kidney disease Mother        ? hypertensive   Hypertension Mother    Deep vein thrombosis Mother        post ankle fracture   Leukemia Father    Cancer Father    Colon cancer Maternal Grandmother    Diabetes Other        cousins   Hypertension Sister    Anesthesia problems Neg Hx    Hypotension Neg Hx    Malignant hyperthermia Neg Hx    Pseudochol deficiency Neg Hx    Stroke Neg Hx    Heart disease Neg Hx    Breast cancer Neg Hx    Ovarian cancer Neg Hx    Uterine cancer Neg Hx     Review of Systems  Constitutional:  Negative for fever.  Respiratory:  Positive for shortness of breath. Negative for cough and wheezing.   Cardiovascular:  Negative for chest pain and leg swelling.  Musculoskeletal:  Positive for back pain.  Neurological:  Negative for numbness.       Objective:   Vitals:   12/05/20 0807  BP: 130/60  Pulse: (!) 50  Temp: 98.1 F (36.7 C)  SpO2: 99%   BP Readings from Last 3 Encounters:  12/05/20 130/60  12/04/20 134/70  11/22/20 (!) 148/43   Wt Readings from Last 3 Encounters:  12/05/20 162 lb (73.5 kg)  12/04/20 161 lb (73 kg)  11/22/20 163 lb 8 oz (74.2 kg)   Body mass index is 31.64 kg/m.   Physical Exam Constitutional:      General: She is not in acute distress.    Appearance: Normal appearance. She is not ill-appearing.  HENT:     Head: Normocephalic and atraumatic.  Musculoskeletal:        General: Tenderness (minimal paravertebral muscle tenderness between scapulas) present. No deformity.     Comments: T-spine tenderness  Skin:    General: Skin is warm and dry.  Neurological:     Mental Status: She is alert.      CT Chest Wo Contrast CLINICAL DATA:  Brain/CNS neoplasm, staging, possible granulomatous CNS lesion, new episodic dyspnea, shortness of breath since June 2022, fatigue, history of vaginal cancer post radiation therapy, endometrioid adenocarcinoma of the uterus,  EXAM: CT CHEST WITHOUT CONTRAST  TECHNIQUE: Multidetector CT imaging of the chest was performed following the standard protocol without IV contrast. Sagittal and coronal MPR images reconstructed from axial data set.  COMPARISON:  None  FINDINGS: Cardiovascular: Atherosclerotic calcifications aorta, proximal great vessels, minimally in coronary arteries. Aorta normal caliber. Heart unremarkable. No pericardial effusion.  Mediastinum/Nodes: Tiny hiatal hernia. Esophagus unremarkable. No thoracic adenopathy. Base of cervical region normal appearance. Asymmetric density outer LEFT breast 15 x 5 x 4 mm image 58.  Lungs/Pleura: Lungs clear. No pulmonary infiltrate, pleural effusion, or pneumothorax.  Upper Abdomen: Gallbladder surgically absent. Small myelolipoma of the  LEFT adrenal gland 12 x 11 mm. Few calcified  granulomata within spleen.  Musculoskeletal: Osseous demineralization.  IMPRESSION: No acute intrathoracic abnormalities.  Tiny hiatal hernia.  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  Asymmetric density outer LEFT breast 15 x 5 x 4 mm, nonspecific; correlation with diagnostic mammography recommended with ultrasound as mammographically directed.  Aortic Atherosclerosis (ICD10-I70.0).  These results will be called to the ordering clinician or representative by the Radiologist Assistant, and communication documented in the PACS or [employer].  Electronically Signed   By: Lavonia Dana M.D.   On: 11/21/2020 16:53  Thoracic spine xray - IMPRESSION: Osteoarthritic change at multiple levels. Scoliosis. No fracture or spondylolisthesis.      Assessment & Plan:    See Problem List for Assessment and Plan of chronic medical problems.    This visit occurred during the SARS-CoV-2 public health emergency.  Safety protocols were in place, including screening questions prior to the visit, additional usage of staff PPE, and extensive cleaning of exam room while observing appropriate contact time as indicated for disinfecting solutions.

## 2020-12-05 ENCOUNTER — Ambulatory Visit (INDEPENDENT_AMBULATORY_CARE_PROVIDER_SITE_OTHER): Payer: Medicare Other | Admitting: Internal Medicine

## 2020-12-05 ENCOUNTER — Ambulatory Visit (INDEPENDENT_AMBULATORY_CARE_PROVIDER_SITE_OTHER): Payer: Medicare Other

## 2020-12-05 ENCOUNTER — Encounter: Payer: Self-pay | Admitting: Internal Medicine

## 2020-12-05 VITALS — BP 130/60 | HR 50 | Temp 98.1°F | Ht 60.0 in | Wt 162.0 lb

## 2020-12-05 DIAGNOSIS — M546 Pain in thoracic spine: Secondary | ICD-10-CM | POA: Diagnosis not present

## 2020-12-05 IMAGING — CT CT HEAD W/O CM
3 series · 16 of 47 positions shown, 19 images · non-contrast
Comparison: Brain MRI 01/23/2019, head CT 05/14/2018

CLINICAL DATA: Headache, acute, normal neuro exam. Additional
history provided: High blood pressure, some dizziness.

EXAM:
CT HEAD WITHOUT CONTRAST
TECHNIQUE: Contiguous axial images were obtained from the base of the skull
through the vertex without intravenous contrast.

[Series 2: head wo · axial · 0.43mm/px · z∈[-174,-44]mm · 10 of 32 slices shown, 13 images]
[im 3/32  brain]
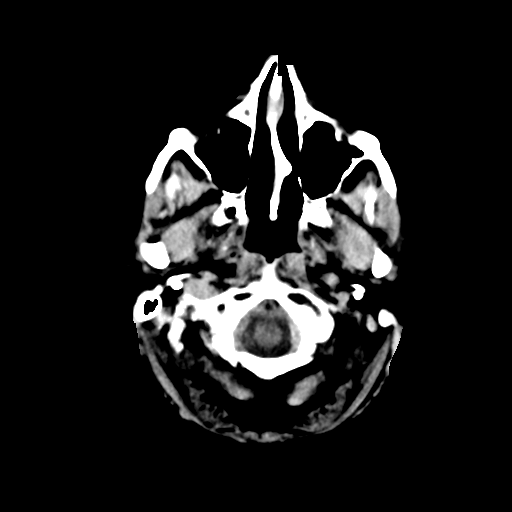
[im 3/32  bone]
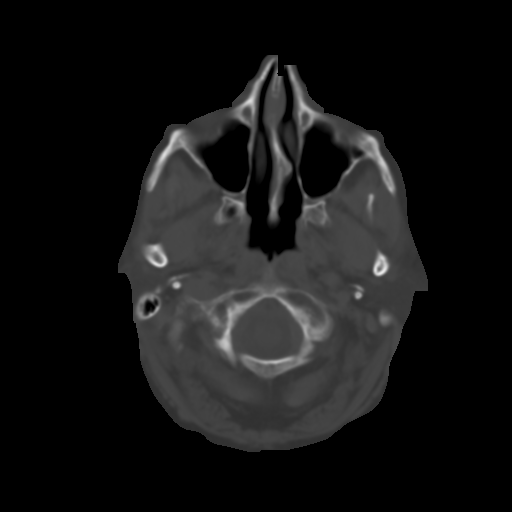
[im 6/32  brain]
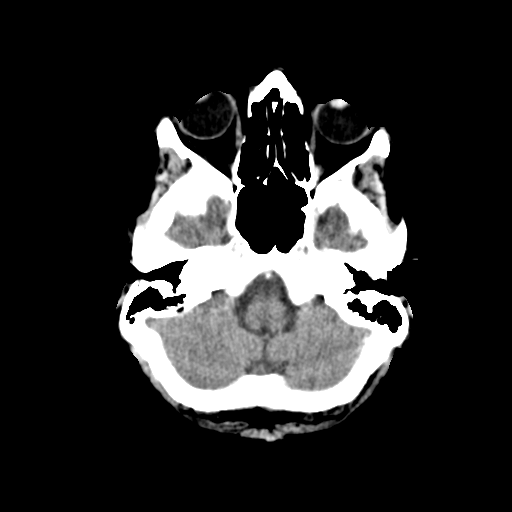
[im 9/32  brain]
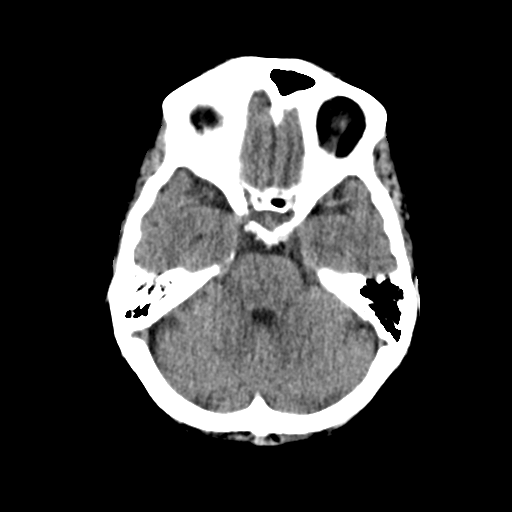
[im 11/32  brain]
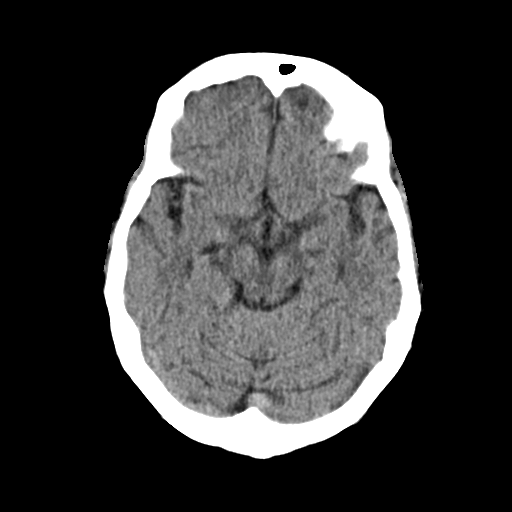
[im 14/32  brain]
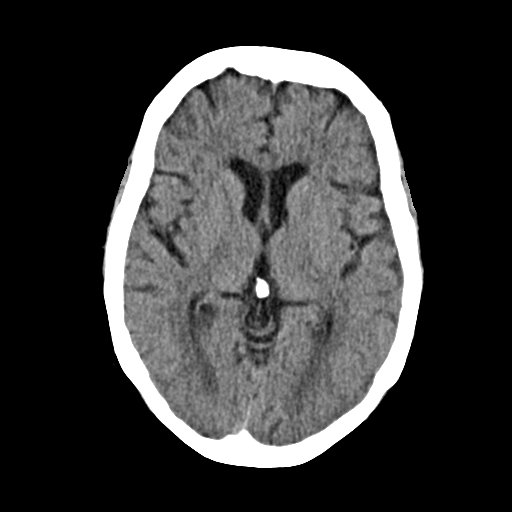
[im 14/32  bone]
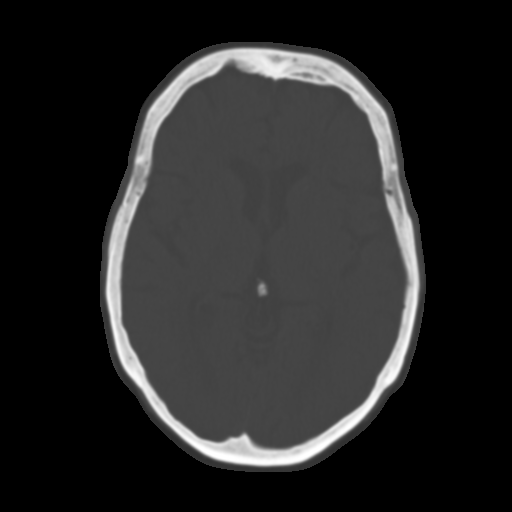
[im 18/32  brain]
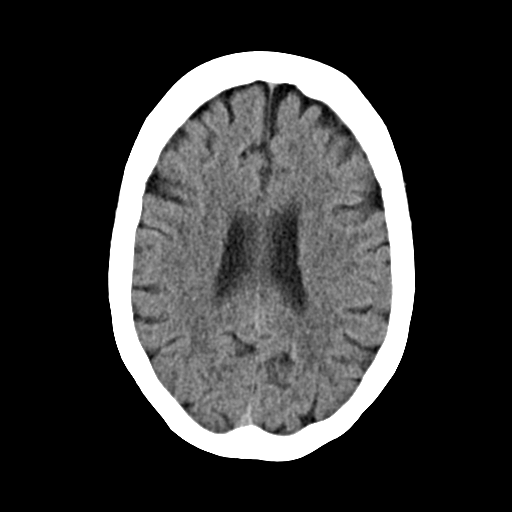
[im 21/32  brain]
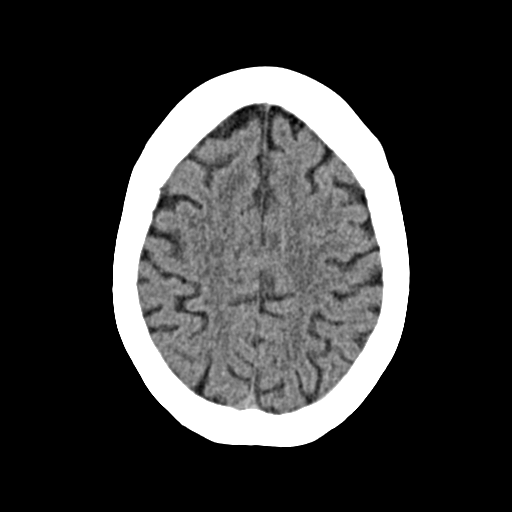
[im 24/32  brain]
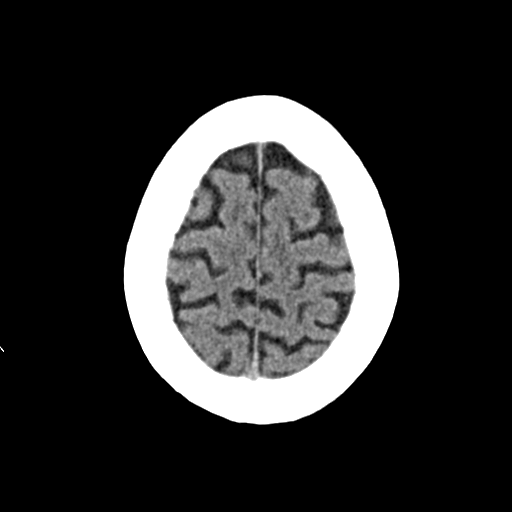
[im 26/32  brain]
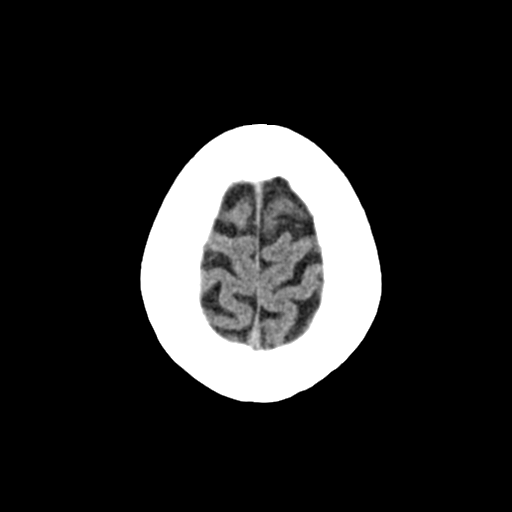
[im 26/32  bone]
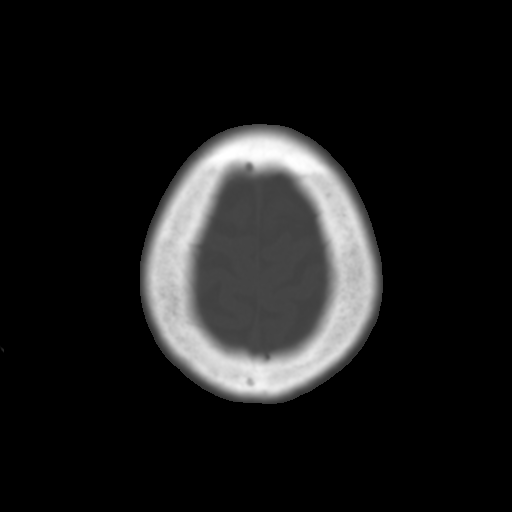
[im 29/32  brain]
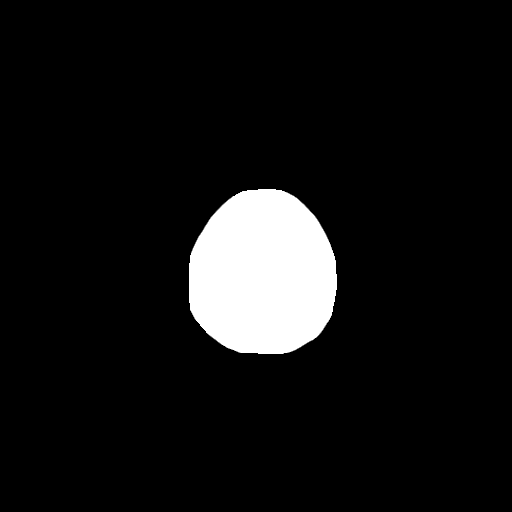

[Series 4: coronal soft · coronal · 0.32mm/px · 3 of 62 slices shown]
[im 21/62  brain]
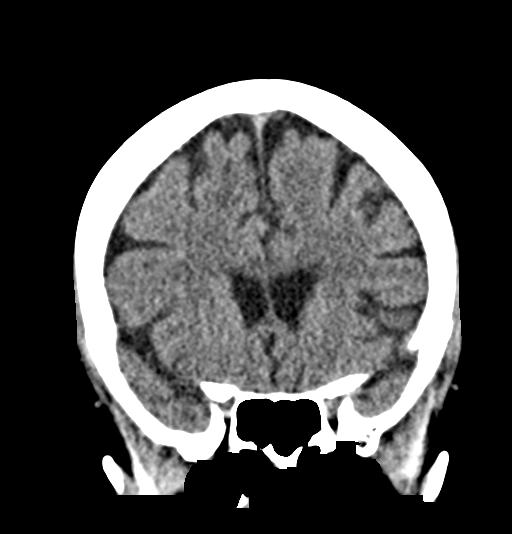
[im 28/62  brain]
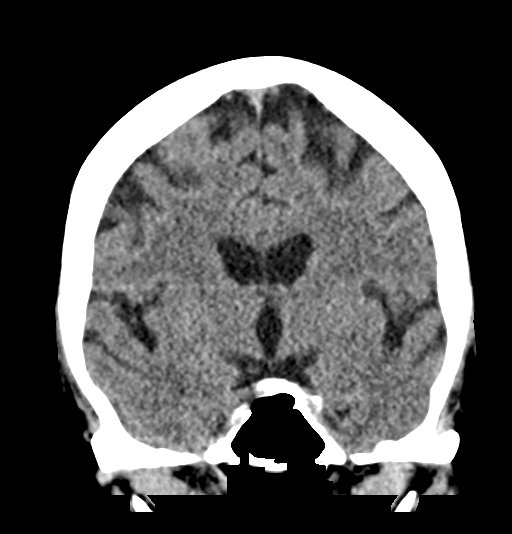
[im 34/62  brain]
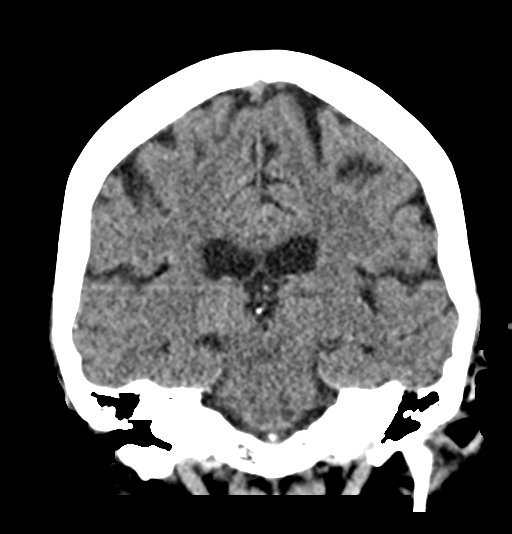

[Series 5: sag soft · sagittal · 0.33mm/px · 3 of 50 slices shown]
[im 17/50  brain]
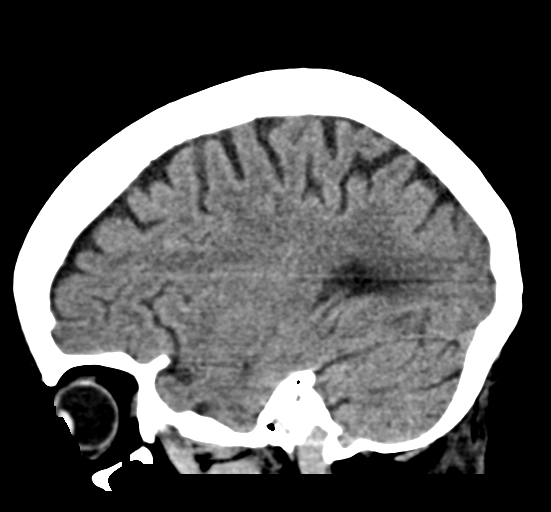
[im 25/50  brain]
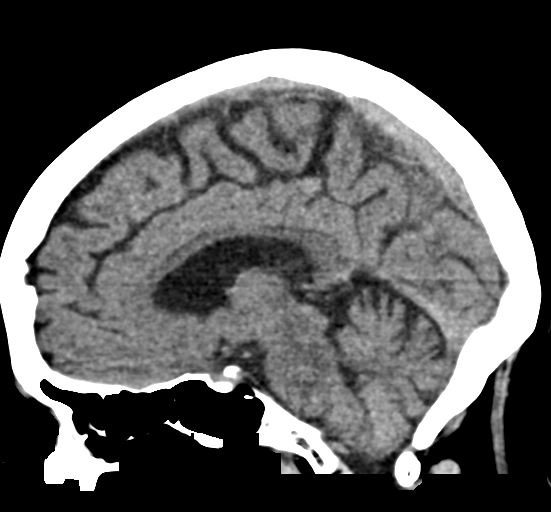
[im 33/50  brain]
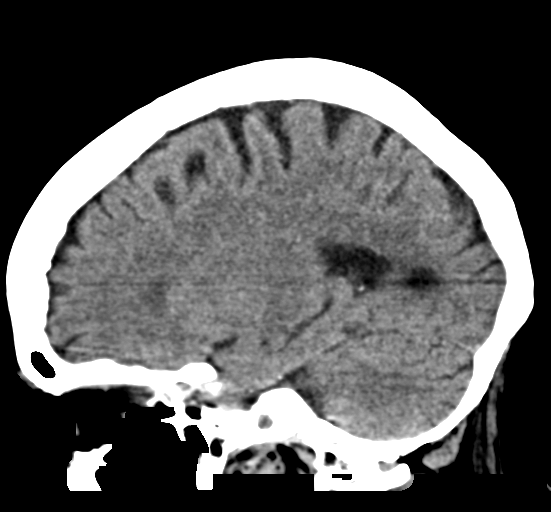

[16 of 47 positions shown; findings below may reference images not displayed]

FINDINGS: Brain:

No evidence of acute intracranial hemorrhage.

No demarcated cortical infarction.

No midline shift or extra-axial fluid collection.

Lacunar infarct questioned within the medial left thalamus, new from
prior MRI 01/23/2019 but otherwise age indeterminate (series 2,
image 15) (series 5, image 30).

A previously demonstrated enhancing right cavernous sinus mass is
not well reassessed on this noncontrast head CT.

Mild generalized parenchymal atrophy.

Vascular: No hyperdense vessel.  Atherosclerotic calcifications.

Skull: Normal. Negative for fracture or focal lesion.

Sinuses/Orbits: Visualized orbits demonstrate no acute abnormality.
No significant paranasal sinus disease or mastoid effusion at the
imaged levels.
IMPRESSION: Lacunar infarct questioned within the medial left thalamus, new from
prior MRI 01/23/2019, but otherwise age indeterminate.

A previously demonstrated enhancing right cavernous sinus mass is
not well reassessed on this noncontrast head CT. Please refer to
prior brain MRI for further description.

Mild generalized parenchymal atrophy.

## 2020-12-05 NOTE — Patient Instructions (Addendum)
  A back xray was ordered.  Have this downstairs.    Medications changes include :  none.  Continue tylenol as needed.    If your pain continues lets have you see sports medicine.

## 2020-12-07 ENCOUNTER — Institutional Professional Consult (permissible substitution): Payer: Medicare Other | Admitting: Emergency Medicine

## 2020-12-11 ENCOUNTER — Ambulatory Visit: Payer: Medicare Other

## 2020-12-11 ENCOUNTER — Other Ambulatory Visit: Payer: Self-pay

## 2020-12-11 ENCOUNTER — Ambulatory Visit (INDEPENDENT_AMBULATORY_CARE_PROVIDER_SITE_OTHER): Payer: Medicare Other

## 2020-12-11 DIAGNOSIS — Z7901 Long term (current) use of anticoagulants: Secondary | ICD-10-CM | POA: Diagnosis not present

## 2020-12-11 DIAGNOSIS — R928 Other abnormal and inconclusive findings on diagnostic imaging of breast: Secondary | ICD-10-CM | POA: Diagnosis not present

## 2020-12-11 LAB — POCT INR: INR: 2.7 (ref 2.0–3.0)

## 2020-12-11 NOTE — Patient Instructions (Addendum)
Pre visit review using our clinic review tool, if applicable. No additional management support is needed unless otherwise documented below in the visit note.  Continue to take 1/2 tablet daily except 1 tablet on Mondays .    Re-check in to 6 weeks.

## 2020-12-12 NOTE — Progress Notes (Signed)
Agree with management.  Shivaan Tierno J Flavia Bruss, MD  

## 2020-12-13 ENCOUNTER — Ambulatory Visit: Payer: Medicare Other | Admitting: Cardiology

## 2020-12-17 ENCOUNTER — Telehealth: Payer: Self-pay | Admitting: Pharmacist

## 2020-12-17 NOTE — Progress Notes (Signed)
    Chronic Care Management Pharmacy Assistant   Name: MILEAH HEMMER MRN: [id number] DOB: 25-Jan-1941  Reason for Encounter: Disease State - General Adherence   Recent office visits:  12/05/20 Burns (PCP) - Acute midline thoracic back pain. No med changes.   09/26/20 Burns (PCP) - SOB. No med changes. Referral to Cardiology.  09/03/20 Burns (PCP) - Essential hypertension. Start Amoxicillin-Pot. 875-125 mg.  03/05/20 Burns (PCP) - Annual exam. No med changes.  09/01/19 Burns (PCP) - Essential hypertension. Start Spironolactone.   Recent consult visits:  12/04/20 [facility] (Neurology) - Flushing, lightheadedness. No med changes.  11/05/20 10/08/20 [facility] (Cardiology) - Dyspnea on exertion.  Increase Spironolactone 25 mg. D/c metoprolol.  11/01/20 [facility] (Pulmonary) - Dyspnea. No med changes.  10/08/20 [facility] (Cardiology) - Dyspnea. No med changes.  08/30/20 [facility] (Oncology) - Brain mass. No med changes.  08/14/20 [facility] (Oncology) - Brain mass. No med changes.  02/27/20 [facility] Oncology) - Endometrial cancer. D/c Nystatin.  01/13/20 [facility] (Internal Med) - Encounter for general adult medical examination without abnormal findings.  01/11/20 [facility] (Dermatology) - Actinic keratosis.  12/22/19 [facility] (Podiatry) - Onychodystrophy . No med changes.  10/18/19 [facility] (Internal Med) - Personal history of other venous thrombosis and embolism.  08/19/19 [facility] Lopes [facility] Oncology) - Endometrial cancer. Start Nystatin. D/c Meclizine.  08/12/19 [facility] (Oncology) - Brain mass. No med changes.  08/10/19 [facility] (Neurosurgery) - Neoplasm of unspecified behavior of brain.   08/09/19 [facility]on Rounds (OB/GYN) - Vaginal bleeding. No med changes.  Hospital visits:  None in previous 6 months  Medications: Outpatient Encounter Medications as of 12/17/2020  Medication Sig   acetaminophen (TYLENOL) 500 MG tablet Take 500 mg by mouth every 6 (six) hours as needed.   amLODipine (NORVASC) 10 MG tablet Take 1 tablet  (10 mg total) by mouth daily.   benazepril (LOTENSIN) 40 MG tablet TAKE 1 TABLET BY MOUTH DAILY.   cholecalciferol (VITAMIN D3) 25 MCG (1000 UNIT) tablet Take 1,000 Units by mouth daily.   pantoprazole (PROTONIX) 40 MG tablet TAKE 1 TABLET BY MOUTH ONCE DAILY AS NEEDED FOR ACID REFLUX   simvastatin (ZOCOR) 10 MG tablet TAKE 1 TABLET BY MOUTH AT BEDTIME.   spironolactone (ALDACTONE) 25 MG tablet Take 1 tablet (25 mg total) by mouth daily.   vitamin B-12 (CYANOCOBALAMIN) 1000 MCG tablet Take 1,000 mcg by mouth daily.   warfarin (COUMADIN) 5 MG tablet Take 1/2 tablet daily except take 1 tablet on Mon and Thurs or take as directed by anticoagulation clinic   No facility-administered encounter medications on file as of 12/17/2020.    Have you had any problems recently with your health? Patient states she seen cardiologists in July about her BP and had some medication changes.  Have you had any problems with your pharmacy? Patient states its no problems at this time.   What issues or side effects are you having with your medications? Patient states no side effects or issues.   What would you like me to pass along to [facility] for them to help you with?  Patient states its nothing at this time.   What can we do to take care of you better? Patient states no concerns at this time. Patients is due for a follow-up with Mendel Ryder, [place].    Star Rating Drugs: Benazepril - filled 08/2720 90D Simvastatin - filled 10/15/20 [place], [place] Clinical Pharmacists Assistant [phone]

## 2020-12-21 ENCOUNTER — Telehealth: Payer: Self-pay

## 2020-12-21 ENCOUNTER — Ambulatory Visit: Payer: Medicare Other | Admitting: Cardiology

## 2020-12-21 NOTE — Telephone Encounter (Signed)
Returning call to Bonnie Zhang. Bonnie Zhang states she has blood in her stool. She reports seeing it a few times over the last few weeks, it comes and goes. The first time she noticed the blood it was darker in appearance and is now bright red. It is only on the tissue and not in her stool. She reports passing gas this morning and feeling something wet. When she went to the restroom she had a small amount of blood in her underwear (quarter size) and more on her bottom when she wiped. Bonnie Zhang reports a history of hemorrhoids. She reports her BM's are soft and regular, no constipation or straining. Bonnie Zhang denies pain.  Per Joylene John, NP Bonnie Zhang needs to follow up with GI as soon as possible.  Bonnie Zhang has seen Dr. Sharlett Iles with [facility] but is has been several years. She would like a new referral, preferably with a female but will take first available. Referral has been sent. Instructed Bonnie Zhang to call with any questions or concerns.

## 2020-12-26 ENCOUNTER — Encounter: Payer: Self-pay | Admitting: Gastroenterology

## 2020-12-26 ENCOUNTER — Ambulatory Visit: Payer: Medicare Other | Admitting: Gastroenterology

## 2020-12-26 ENCOUNTER — Telehealth: Payer: Self-pay

## 2020-12-26 VITALS — BP 130/54 | HR 60 | Ht 59.0 in | Wt 163.5 lb

## 2020-12-26 DIAGNOSIS — R232 Flushing: Secondary | ICD-10-CM | POA: Diagnosis not present

## 2020-12-26 DIAGNOSIS — Z7901 Long term (current) use of anticoagulants: Secondary | ICD-10-CM

## 2020-12-26 DIAGNOSIS — K921 Melena: Secondary | ICD-10-CM | POA: Diagnosis not present

## 2020-12-26 MED ORDER — PLENVU 140 G PO SOLR: ORAL | 0 refills | Status: DC

## 2020-12-26 MED ORDER — PLENVU 140 G PO SOLR
ORAL | 0 refills | Status: DC
Start: 2020-12-26 — End: 2020-12-26

## 2020-12-26 NOTE — Patient Instructions (Signed)
If you are age 79 or older, your body mass index should be between 23-30. Your Body mass index is 33.02 kg/m. If this is out of the aforementioned range listed, please consider follow up with your Primary Care Provider.  If you are age 53 or younger, your body mass index should be between 19-25. Your Body mass index is 33.02 kg/m. If this is out of the aformentioned range listed, please consider follow up with your Primary Care Provider.   You have been scheduled for a colonoscopy. Please follow written instructions given to you at your visit today.  Please pick up your prep supplies at the pharmacy within the next 1-3 days. If you use inhalers (even only as needed), please bring them with you on the day of your procedure.  The [place] [facility] providers would like to encourage you to use [facility] to communicate with providers for non-urgent requests or questions.  Due to long hold times on the telephone, sending your provider a message by [facility] may be a faster and more efficient way to get a response.  Please allow 48 business hours for a response.  Please remember that this is for non-urgent requests.   It was a pleasure to see you today!  Thank you for trusting me with your gastrointestinal care!    Scott E. Candis Schatz, MD

## 2020-12-26 NOTE — Telephone Encounter (Signed)
    Patient Name: Bonnie Zhang  DOB: Jan 20, 1941 MRN: [id number]  Primary Cardiologist: Kate Sable, MD  Chart reviewed as part of pre-operative protocol coverage.   Patient is on coumadin for history of DVT/Factor V leiden. Coumadin is managed by PCP therefore will defer recommendations for holding coumadin to Dr. Quay Burow.   I will route this recommendation to the requesting party via Epic fax function and remove from pre-op pool.  Please call with questions.  Abigail Butts, PA-C 12/26/2020, 1:08 PM
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
## 2020-12-26 NOTE — Telephone Encounter (Signed)
[facility] Pre-operative Risk Assessment     Request for surgical clearance:     Endoscopy Procedure  What type of surgery is being performed?     Colonoscopy  When is this surgery scheduled?     01/11/21  What type of clearance is required ?   Pharmacy  Are there any medications that need to be held prior to surgery and how long? Provider would like to know if patient could do Lovenox Bridge versus holding Coumadin.  Practice name and name of physician performing surgery?      [facility]  What is your office phone and fax number?      Phone- [phone]  [phone]  Anesthesia type (None, local, MAC, general) ?       MAC

## 2020-12-27 NOTE — Telephone Encounter (Signed)
She should have lovenox bridging for the colonoscopy.   Larene [place] can you arrange this?

## 2020-12-27 NOTE — Telephone Encounter (Signed)
   Bonnie Zhang Jan 15, 1941 [id number]  Dear Dr.Burns:  We have scheduled the above named patient for a(n) Colonoscopy  procedure. Our records show that (s)he is on anticoagulation therapy.  Please advise as to whether the patient may do a Lovenox Bridge Versus holding Coumadin  prior to their procedure which is scheduled for 01/11/21.  Please route your response to [employer] or fax response to [phone].  Sincerely,    [facility]

## 2020-12-28 NOTE — Telephone Encounter (Signed)
Will work on lovenox bridge and send to PCP for approval next week.

## 2020-12-31 ENCOUNTER — Telehealth: Payer: Self-pay

## 2020-12-31 ENCOUNTER — Telehealth: Payer: Self-pay | Admitting: *Deleted

## 2020-12-31 NOTE — Telephone Encounter (Signed)
Open in eror
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
## 2021-01-01 MED ORDER — ENOXAPARIN SODIUM 120 MG/0.8ML IJ SOSY: 120.0000 mg | PREFILLED_SYRINGE | INTRAMUSCULAR | 0 refills | Status: DC

## 2021-01-01 NOTE — Telephone Encounter (Signed)
Actual weight 74.2kg Adjusted weight 56.07 CrCl 40.79 Ok to use lovenox QD, 120mg  , total of 9 syringes  Normal warfarin dosing currently is 2.5mg  daily except 5mg  on Mondays.  Coumadin and lovenox bridge dosing for pre and post colonoscopy on 10/14.  10/9: Take last dose of coumadin 10/10: No coumadin, no lovenox 10/11: No coumadin, lovenox AM 10/12: No coumadin, lovenox AM 10/13: No coumadin, lovenox AM, Before 7AM  10/14: SURGERY: NO COUMADIN, NO LOVENOX  10/15: Take 1 tablet (5mg ) coumadin, lovenox AM 10/16: Take 1 tablet (5mg ) coumadin, lovenox AM 10/17: Take 1 1/2 tablets (7.5mg ) coumadin, lovenox AM 10/18: Take 1/2 tablet (2.5mg ) coumadin, lovenox AM 10/19: Take 1/2 tablet (2.5mg ) coumadin, lovenox AM 10/20: Recheck INR

## 2021-01-01 NOTE — Telephone Encounter (Signed)
Looks good, thank you.

## 2021-01-01 NOTE — Addendum Note (Signed)
Addended by: Randall An on: 01/01/2021 04:54 PM   Modules accepted: Orders

## 2021-01-01 NOTE — Telephone Encounter (Signed)
Advised pt of coumadin dosing and lovenox bridge. Pt wrote directions down and readback for verification. Pt will call if any questions. Advised script will be sent in today and if any problems to contact this nurse. Pt verbalized understanding.  Sent in script to pt's preferred pharmacy, [facility].

## 2021-01-02 DIAGNOSIS — M810 Age-related osteoporosis without current pathological fracture: Secondary | ICD-10-CM | POA: Diagnosis not present

## 2021-01-02 DIAGNOSIS — Z780 Asymptomatic menopausal state: Secondary | ICD-10-CM | POA: Diagnosis not present

## 2021-01-02 DIAGNOSIS — M8589 Other specified disorders of bone density and structure, multiple sites: Secondary | ICD-10-CM | POA: Diagnosis not present

## 2021-01-02 LAB — HM DEXA SCAN

## 2021-01-03 NOTE — Telephone Encounter (Signed)
Pharmacy called reporting confusion with the lovenox script. Advised to dispense 9 syringes. The pharmacist readback the instructions and verbalized understanding.

## 2021-01-07 ENCOUNTER — Telehealth: Payer: Self-pay

## 2021-01-07 NOTE — Telephone Encounter (Signed)
Received call from Ms. Pettinato. She states she is still having spotting after dilator use and seeing pink blood in the lubricant. She denies pain or discomfort. Reports spotting only after dilator use. Patient requesting an appointment to be evaluated. Appointment scheduled with Joylene John, NP 01/16/21 ay 2pm. Patient wanted to wait until after her colonoscopy on 01/11/21 and states she wanted time to recover. Instructed to call with any questions or concerns. Patient verbalized understanding.

## 2021-01-10 ENCOUNTER — Ambulatory Visit: Payer: Medicare Other | Admitting: Cardiology

## 2021-01-11 ENCOUNTER — Ambulatory Visit (AMBULATORY_SURGERY_CENTER): Payer: Medicare Other | Admitting: Gastroenterology

## 2021-01-11 ENCOUNTER — Encounter: Payer: Self-pay | Admitting: Gastroenterology

## 2021-01-11 VITALS — BP 90/59 | HR 60 | Temp 97.8°F | Resp 13 | Ht 59.0 in | Wt 163.0 lb

## 2021-01-11 DIAGNOSIS — K6289 Other specified diseases of anus and rectum: Secondary | ICD-10-CM | POA: Diagnosis not present

## 2021-01-11 DIAGNOSIS — K921 Melena: Secondary | ICD-10-CM

## 2021-01-11 DIAGNOSIS — D123 Benign neoplasm of transverse colon: Secondary | ICD-10-CM | POA: Diagnosis not present

## 2021-01-11 DIAGNOSIS — K639 Disease of intestine, unspecified: Secondary | ICD-10-CM

## 2021-01-11 DIAGNOSIS — D122 Benign neoplasm of ascending colon: Secondary | ICD-10-CM

## 2021-01-11 MED ORDER — SODIUM CHLORIDE 0.9 % IV SOLN: 500.0000 mL | Freq: Once | INTRAVENOUS | Status: DC

## 2021-01-11 NOTE — Progress Notes (Signed)
Pt's states no medical or surgical changes since previsit or office visit. 

## 2021-01-11 NOTE — Patient Instructions (Signed)
Resume previous diet. Resume Coumadin at previous dose tomorrow. Refer to Coumadin clinic for further adjustment. Repeat colonoscopy not recommended due to age and comorbidities.   YOU HAD AN ENDOSCOPIC PROCEDURE TODAY AT [facility]:   Refer to the procedure report that was given to you for any specific questions about what was found during the examination.  If the procedure report does not answer your questions, please call your gastroenterologist to clarify.  If you requested that your care partner not be given the details of your procedure findings, then the procedure report has been included in a sealed envelope for you to review at your convenience later.  YOU SHOULD EXPECT: Some feelings of bloating in the abdomen. Passage of more gas than usual.  Walking can help get rid of the air that was put into your GI tract during the procedure and reduce the bloating. If you had a lower endoscopy (such as a colonoscopy or flexible sigmoidoscopy) you may notice spotting of blood in your stool or on the toilet paper. If you underwent a bowel prep for your procedure, you may not have a normal bowel movement for a few days.  Please Note:  You might notice some irritation and congestion in your nose or some drainage.  This is from the oxygen used during your procedure.  There is no need for concern and it should clear up in a day or so.  SYMPTOMS TO REPORT IMMEDIATELY:  Following lower endoscopy (colonoscopy or flexible sigmoidoscopy):  Excessive amounts of blood in the stool  Significant tenderness or worsening of abdominal pains  Swelling of the abdomen that is new, acute  Fever of 100F or higher  For urgent or emergent issues, a gastroenterologist can be reached at any hour by calling [phone]. Do not use MyChart messaging for urgent concerns.    DIET:  We do recommend a small meal at first, but then you may proceed to your regular diet.  Drink plenty of fluids but you should  avoid alcoholic beverages for 24 hours.  ACTIVITY:  You should plan to take it easy for the rest of today and you should NOT DRIVE or use heavy machinery until tomorrow (because of the sedation medicines used during the test).    FOLLOW UP: Our staff will call the number listed on your records 48-72 hours following your procedure to check on you and address any questions or concerns that you may have regarding the information given to you following your procedure. If we do not reach you, we will leave a message.  We will attempt to reach you two times.  During this call, we will ask if you have developed any symptoms of COVID 19. If you develop any symptoms (ie: fever, flu-like symptoms, shortness of breath, cough etc.) before then, please call [phone].  If you test positive for Covid 19 in the 2 weeks post procedure, please call and report this information to [place].    If any biopsies were taken you will be contacted by phone or by letter within the next 1-3 weeks.  Please call us at [phone] if you have not heard about the biopsies in 3 weeks.    SIGNATURES/CONFIDENTIALITY: You and/or your care partner have signed paperwork which will be entered into your electronic medical record.  These signatures attest to the fact that that the information above on your After Visit Summary has been reviewed and is understood.  Full responsibility of the confidentiality of this discharge  information lies with you and/or your care-partner.

## 2021-01-11 NOTE — Progress Notes (Signed)
History and Physical Interval Note:  No changes in patient's symptoms or medical history since her clinic visit.  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  No bleeding during bowel prep.  01/11/2021 1:42 PM  Bonnie Zhang  has presented today for endoscopic procedure(s), with the diagnosis of  Encounter Diagnosis  Name Primary?   Hematochezia Yes  .  The various methods of evaluation and treatment have been discussed with the patient and/or family. After consideration of risks, benefits and other options for treatment, the patient has consented to  the endoscopic procedure(s).   The patient's history has been reviewed, patient examined, no change in status, stable for endoscopic procedure(s).  I have reviewed the patient's chart and labs.  Questions were answered to the patient's satisfaction.     Len Kluver E. Candis Schatz, MD [facility]

## 2021-01-11 NOTE — Progress Notes (Signed)
Vss nad pt transferred

## 2021-01-11 NOTE — Op Note (Signed)
[place] Patient Name: Bonnie Zhang Procedure Date: 01/11/2021 1:42 PM MRN: [id number] Endoscopist: Nicki Reaper E. Candis Schatz , MD Age: 80 Referring MD:  Date of Birth: November 24, 1940 Gender: Female Account #: [id number] Procedure:                Colonoscopy Indications:              Hematochezia Medicines:                Monitored Anesthesia Care Procedure:                Pre-Anesthesia Assessment:                           - Prior to the procedure, a History and Physical                            was performed, and patient medications and                            allergies were reviewed. The patient's tolerance of                            previous anesthesia was also reviewed. The risks                            and benefits of the procedure and the sedation                            options and risks were discussed with the patient.                            All questions were answered, and informed consent                            was obtained. Prior Anticoagulants: The patient has                            taken Coumadin (warfarin), last dose was 5 days                            prior to procedure. She is on a lovenox bridge                            which is managed by her coumadin clinic. ASA Grade                            Assessment: III - A patient with severe systemic                            disease. After reviewing the risks and benefits,                            the patient was deemed in satisfactory condition to  undergo the procedure.                           After obtaining informed consent, the colonoscope                            was passed under direct vision. Throughout the                            procedure, the patient's blood pressure, pulse, and                            oxygen saturations were monitored continuously. The                            Olympus PCF-H190DL (#[id number]) Colonoscope was                             introduced through the anus and advanced to the the                            terminal ileum, with identification of the                            appendiceal orifice and IC valve. The colonoscopy                            was somewhat difficult due to restricted mobility                            of the colon and a tortuous colon. Successful                            completion of the procedure was aided by using                            manual pressure and withdrawing the scope and                            replacing with the pediatric endoscope. The patient                            tolerated the procedure well. The quality of the                            bowel preparation was good. The terminal ileum,                            ileocecal valve, appendiceal orifice, and rectum                            were photographed. Scope In: 2:03:58 PM Scope Out: 2:31:28 PM Scope Withdrawal Time: 0 hours 23 minutes 30 seconds  Total Procedure Duration: 0 hours 27 minutes  30 seconds  Findings:                 The perianal and digital rectal examinations were                            normal. Pertinent negatives include normal                            sphincter tone and no palpable rectal lesions.                           Two sessile polyps were found in the ascending                            colon. The polyps were 5 to 10 mm in size. These                            polyps were removed with a cold snare. Resection                            and retrieval were complete. Estimated blood loss                            was minimal.                           A 8 mm polyp was found in the hepatic flexure. The                            polyp was sessile. The polyp was removed with a                            cold snare. Resection and retrieval were complete.                            Estimated blood loss was minimal.                           Two sessile polyps  were found in the transverse                            colon. The polyps were 4 to 5 mm in size. These                            polyps were removed with a cold snare. Resection                            and retrieval were complete. Estimated blood loss                            was minimal.                           Multiple  small and large-mouthed diverticula were                            found in the sigmoid colon, descending colon and                            transverse colon.                           An area of moderately congested mucosa was found in                            the sigmoid colon. Biopsies were taken with a cold                            forceps for histology. Estimated blood loss was                            minimal.                           A localized area of mildly altered vascular mucosa                            was found in the rectum, characterized by decreased                            vascularity and neovascularization and friability.                           The exam was otherwise normal throughout the                            examined colon.                           The terminal ileum appeared normal.                           No additional abnormalities were found on                            retroflexion. Complications:            No immediate complications. Estimated Blood Loss:     Estimated blood loss was minimal. Impression:               - Two 5 to 10 mm polyps in the ascending colon,                            removed with a cold snare. Resected and retrieved.                           - One 8 mm polyp at the hepatic flexure, removed  with a cold snare. Resected and retrieved.                           - Two 4 to 5 mm polyps in the transverse colon,                            removed with a cold snare. Resected and retrieved.                           - Diverticulosis in the sigmoid colon, in the                             descending colon and in the transverse colon.                           - Congested mucosa in the sigmoid colon. Biopsied.                            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 This is the source of                            the patient's hematochezia.                           - The examined portion of the ileum was normal. Recommendation:           - Patient has a contact number available for                            emergencies. The signs and symptoms of potential                            delayed complications were discussed with the                            patient. Return to normal activities tomorrow.                            Written discharge instructions were provided to the                            patient.                           - Resume previous diet.                           -  Resume Coumadin (warfarin) at recommended                            tomorrow. Refer to Coumadin Clinic for further                            adjustment of therapy.                           - Await pathology results.                           - Recommend against further surveillance                            colonoscopies given patient's age and comorbidities                           - Will discuss with patient treatment options for                            radiation proctopathy. Jarita Raval E. Candis Schatz, MD 01/11/2021 2:44:54 PM This report has been signed electronically.

## 2021-01-11 NOTE — Progress Notes (Signed)
Called to room to assist during endoscopic procedure.  Patient ID and intended procedure confirmed with present staff. Received instructions for my participation in the procedure from the performing physician.  

## 2021-01-14 ENCOUNTER — Telehealth: Payer: Self-pay | Admitting: Internal Medicine

## 2021-01-14 DIAGNOSIS — M810 Age-related osteoporosis without current pathological fracture: Secondary | ICD-10-CM

## 2021-01-14 NOTE — Telephone Encounter (Signed)
Patient says she had bone density test 10/05 & has not been contacted regarding her results  Please call patient [phone]

## 2021-01-14 NOTE — Telephone Encounter (Signed)
I believe she typically gets her bone density done at [facility].  There is no report in her chart I am not sure if I was sent a copy or not. Was I the one that ordered the test?

## 2021-01-14 NOTE — Progress Notes (Signed)
Gynecologic Oncology Symptom Management Appointment  01/16/2021  Reason for Visit: Evaluation of pink spotting with dilator use  Treatment History: Oncology History Overview Note  MMR IHC intact ER/PR strongly positive   Endometrial cancer ([place])  08/09/2019 Initial Biopsy   Vaginal biopsy: Endometrioid adenoca, FIGO gr 2   08/19/2019 Initial Diagnosis   Endometrial cancer ([place])   09/06/2019 - 11/23/2019 Radiation Therapy   Site Technique Total Dose (Gy) Dose per Fx (Gy) Completed Fx Beam Energies  Vagina: Pelvis_Bst HDR-brachy 24/24 6 4/4 Ir-192  Vagina: Pelvis IMRT 45/45 1.8 25/25 6X       2012: Endometrial sampling showed benign endometrium, polyps 08/05/2012: D&C and hysteroscopy, pathology revealed at least atypical complex endometrial hyperplasia with a focal area worrisome for well differentiated endometrioid carcinoma 09/14/2012: Total robotic hysterectomy, BSO, right pelvic lymph node dissection.  Frozen section with no evidence of cancer, possible hyperplasia.  Final pathology revealed extensive atypical complex hyperplasia.  4 right lymph nodes negative for malignancy. 08/09/2019: Patient presented to her OB/GYN with an episode of pink discharge followed by frank bleeding after using a douche.  Since that time has had continued brown discharge. 08/09/2019: Vaginal biopsy shows endometrioid adenocarcinoma  Interval History: Patient presents today for evaluation of pink discharge when using her vaginal dilator. She states she only sees pink discharge on the dilator after use, which started at the beginning of October. She denies pain with dilator use. For the last three episodes of dilator use, she has noticed the pink discharge more near the end of the dilator.  She reports overall doing well. She continues to use her dilator about every other night.  She does have increased urination at night, which is unchanged for her.  She notes that her bowel function is more or less back to  normal although different than her baseline.  She denies any significant change in weight.  She reports a good appetite without nausea or emesis. She denies abdominal/pelvic pain.  Last saw Dr. Sondra Come in August 2022 with NED on examination. She had a colonoscopy last Friday and states 5 polyps were moved. She was placed on a lovenox bridge for the colonoscopy.   Saw Dr. 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 At that time, neither surgery nor biopsy was recommended. As she is asymptomatic and mass is stable in size, continued surveillance has been recommended.   Past Medical/Surgical History: Past Medical History:  Diagnosis Date   Anticoagulated on Coumadin 2001   2 blood clots   Bruises easily    pt is on Coumadin;last dose of Coumadin 08/05/11 and then Lovenox started 08/07/11   DVT (deep vein thrombosis) in pregnancy     1965 post C section;2001 with prolonged driving while on  HRT   DVT (deep venous thrombosis) ([place]) 2001   Was on Prempro.     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 Gery Pray   Homocysteinemia 01/23/2015   Hx of migraines    yrs ago    Hypertension    takes Benazepril,Amlodipine,and Metoprolol daily   Osteoporosis    PONV (postoperative nausea and vomiting)    Primary localized  osteoarthritis of left knee    PVD (peripheral vascular disease) ([place]) 1965, 2001   abnormal venous doppler findings due to recurrent DVT'S left leg   Right knee DJD    knees   Shortness of breath dyspnea    TIA (transient ischemic attack) 2020   Vitamin B12 deficiency (non anemic) 01/24/2015   Vitamin D deficiency    takes VIt D daily    Past Surgical History:   Procedure Laterality Date   ABDOMINAL HYSTERECTOMY     APPENDECTOMY  04/01/1963   BREAST LUMPECTOMY  03/31/1985   CESAREAN SECTION  1961/1965/1966   X 3   CHOLECYSTECTOMY  04/01/1995   COLONOSCOPY W/ POLYPECTOMY  03/31/2005   Dr  Sharlett Iles; due? 2012   [place]  03/31/2010   uterine polyp, Dr Kennon Rounds   ESOPHAGOGASTRODUODENOSCOPY  04/01/1995   HYSTEROSCOPY WITH D & C N/A 08/05/2012   Procedure: DILATATION AND CURETTAGE /HYSTEROSCOPY;  Surgeon: Donnamae Jude, MD;  Location: [facility];  Service: Gynecology;  Laterality: N/A;   ROBOTIC ASSISTED TOTAL HYSTERECTOMY WITH BILATERAL SALPINGO OOPHERECTOMY Right 09/14/2012   Procedure: ROBOTIC ASSISTED TOTAL HYSTERECTOMY WITH BILATERAL SALPINGO OOPHORECTOMY ,right pelvic LYMPHNODE dissection;  Surgeon: Imagene Gurney A. Alycia Rossetti, MD;  Location: [facility];  Service: Gynecology;  Laterality: Right;   TOTAL KNEE ARTHROPLASTY  08/11/2011   Procedure: TOTAL KNEE ARTHROPLASTY;  Surgeon: Lorn Junes, MD;  Location: [facility];  Service: Orthopedics;  Laterality: Right;  DR Ephriam Knuckles 90 MINUTES FOR THIS CASE   TOTAL KNEE ARTHROPLASTY Left 09/11/2014   Procedure: TOTAL KNEE ARTHROPLASTY;  Surgeon: Elsie Saas, MD;  Location: [facility];  Service: Orthopedics;  Laterality: Left;   WISDOM TOOTH EXTRACTION      Family History  Problem Relation Age of Onset   Kidney disease Mother        ? hypertensive   Hypertension Mother    Deep vein thrombosis Mother        post ankle fracture   Leukemia Father    Hypertension Sister    Liver disease Brother    Colon cancer Maternal Grandmother    Heart failure Maternal Grandfather    Diabetes Other        cousins   Anesthesia problems Neg Hx    Hypotension Neg Hx    Malignant hyperthermia Neg Hx    Pseudochol deficiency Neg Hx    Stroke Neg Hx    Heart disease Neg Hx    Breast cancer Neg Hx    Ovarian cancer Neg Hx    Uterine cancer Neg Hx    Pancreatic cancer Neg Hx    Cancer - Prostate Neg Hx      Social History   Socioeconomic History   Marital status: Divorced    Spouse name: Not on file   Number of children: 2   Years of education: Not on file   Highest education level: High school graduate  Occupational History   Occupation: Retired  Tobacco Use   Smoking status: Never   Smokeless tobacco: Never  Vaping Use   Vaping Use: Never used  Substance and Sexual Activity   Alcohol use: No   Drug use: No   Sexual activity: Not Currently    Birth control/protection: Post-menopausal    Comment: retired. has 2 daughters  Other Topics Concern   Not on file  Social History Narrative   Pt lives alone she has 2 daughter - right handed- drinks tea, soda sometimes - No regular exercise  Social Determinants of Health   Financial Resource Strain: Not on file  Food Insecurity: Not on file  Transportation Needs: Not on file  Physical Activity: Not on file  Stress: Not on file  Social Connections: Not on file    Current Medications:  Current Outpatient Medications:    acetaminophen (TYLENOL) 500 MG tablet, Take 500 mg by mouth every 6 (six) hours as needed., Disp: , Rfl:    amLODipine (NORVASC) 10 MG tablet, Take 1 tablet (10 mg total) by mouth daily., Disp: 30 tablet, Rfl: 3   benazepril (LOTENSIN) 40 MG tablet, TAKE 1 TABLET BY MOUTH DAILY., Disp: 90 tablet, Rfl: 1   cholecalciferol (VITAMIN D3) 25 MCG (1000 UNIT) tablet, Take 1,000 Units by mouth daily., Disp: , Rfl:    pantoprazole (PROTONIX) 40 MG tablet, TAKE 1 TABLET BY MOUTH ONCE DAILY AS NEEDED FOR ACID REFLUX, Disp: 90 tablet, Rfl: 0   simvastatin (ZOCOR) 10 MG tablet, TAKE 1 TABLET BY MOUTH AT BEDTIME., Disp: 90 tablet, Rfl: 1   spironolactone (ALDACTONE) 25 MG tablet, Take 1 tablet (25 mg total) by mouth daily., Disp: 90 tablet, Rfl: 1   vitamin B-12 (CYANOCOBALAMIN) 1000 MCG tablet, Take 1,000 mcg by mouth daily., Disp: , Rfl:    warfarin (COUMADIN) 5 MG tablet, Take 1/2 tablet daily except take 1 tablet on Mon  and Thurs or take as directed by anticoagulation clinic, Disp: 90 tablet, Rfl: 1   enoxaparin (LOVENOX) 120 MG/0.8ML injection, Inject 0.8 mLs (120 mg total) into the skin daily. Administer in morning as directed by anticoagulation clinic. (Patient not taking: Reported on 01/16/2021), Disp: 72 mL, Rfl: 0  Review of Systems: Denies appetite changes, fevers, chills, fatigue, unexplained weight changes. Denies hearing loss, neck lumps or masses, mouth sores, ringing in ears or voice changes. Denies cough or wheezing.  Denies shortness of breath. Denies chest pain or palpitations. Denies leg swelling. Denies abdominal distention, pain, blood in stools, constipation, diarrhea, nausea, vomiting, or early satiety. Denies pain with intercourse, dysuria, frequency, hematuria or incontinence. Positive for pink vaginal discharge. Denies hot flashes, pelvic pain, bright red vaginal bleeding.   Denies joint pain, back pain or muscle pain/cramps. Denies itching, rash, or wounds. Denies dizziness, headaches, numbness or seizures. Denies swollen lymph nodes or glands, denies easy bruising or bleeding. Denies anxiety, depression, confusion, or decreased concentration.  Physical Exam: BP (!) 145/54 (BP Location: Left Arm, Patient Position: Sitting)   Pulse 77   Temp 98 F (36.7 C) (Oral)   Resp 16   Ht 4' 11" (1.499 m)   Wt 162 lb (73.5 kg)   SpO2 100%   BMI 32.72 kg/m  General: Alert, oriented, no acute distress. HEENT: Normocephalic, atraumatic, sclera anicteric. Chest: Clear to auscultation bilaterally.  Unlabored breathing on room air. Abdomen: Obese, soft, nontender.  Normoactive bowel sounds.  No masses or hepatosplenomegaly appreciated.  Well-healed scar. Scattered ecchymosis (from recent lovenox injections per pt). Extremities: Grossly normal range of motion.  Warm, well perfused.  No edema bilaterally. Skin: No rashes or lesions noted. Lymphatics: No cervical, supraclavicular, or inguinal  adenopathy. GU: Normal appearing external genitalia without erythema, excoriation, or lesions.  Several small, hyperpigmented lesions on her vulva that appear to be hemangiomas.  Speculum exam reveals atrophic vaginal mucosa, especially at the apex, consistent with radiation changes.  No masses or lesions.  No bleeding or discharge.  Pediatric speculum necessary to view apex of the vagina.  Some vascular congestion noted along the posterior aspect of her  vagina at the introitus.  Bimanual exam reveals some narrowing of the apex of the vagina secondary to radiation, no masses or nodularity.    Laboratory & Radiologic Studies: None new  Assessment & Plan: Bonnie Zhang is a 80 y.o. woman with grade 2 endometrioid adenocarcinoma presenting at the vaginal cuff after prior hysterectomy for complex atypical hyperplasia who is just over a year out from definitive treatment with radiation.     The patient continues to do very well. No lesions noted in the vagina. She is advised to continue dilator use with lubrication. We discussed the radiation changes noted in the vagina. We reviewed signs and symptoms that would be concerning for disease recurrence.  She is advised to follow up as scheduled in December 2022 with Dr. Berline Lopes or sooner if needed. She is advised to call for any needs, concerns, or new/worsening symptoms.

## 2021-01-15 ENCOUNTER — Telehealth: Payer: Self-pay | Admitting: *Deleted

## 2021-01-15 NOTE — Telephone Encounter (Signed)
Report located and placed in your folder for review.

## 2021-01-15 NOTE — Telephone Encounter (Signed)
  Follow up Call-  Call back number 01/11/2021  Post procedure Call Back phone  # [phone]  Permission to leave phone message Yes  Some recent data might be hidden     Patient questions:  Do you have a fever, pain , or abdominal swelling? No. Pain Score  0 *  Have you tolerated food without any problems? Yes.    Have you been able to return to your normal activities? Yes.    Do you have any questions about your discharge instructions: Diet   No. Medications  No. Follow up visit  No.  Do you have questions or concerns about your Care? No.  Actions: * If pain score is 4 or above: No action needed, pain <4.   Have you developed a fever since your procedure? no  2.   Have you had an respiratory symptoms (SOB or cough) since your procedure? no  3.   Have you tested positive for COVID 19 since your procedure no  4.   Have you had any family members/close contacts diagnosed with the COVID 19 since your procedure?  no   If yes to any of these questions please route to Joylene John, RN and Joella Prince, RN

## 2021-01-15 NOTE — Telephone Encounter (Signed)
She has osteoporosis in both hips and osteopenia in her spine-she should consider going on medication to prevent any worsening of her bones and hopefully help improve her bone density.  She also should be exercising regularly, taking calcium and vitamin D daily.  She wants to discuss medication she did not make an appointment.

## 2021-01-16 ENCOUNTER — Inpatient Hospital Stay: Payer: Medicare Other | Attending: Gynecologic Oncology | Admitting: Gynecologic Oncology

## 2021-01-16 ENCOUNTER — Other Ambulatory Visit: Payer: Self-pay

## 2021-01-16 ENCOUNTER — Encounter: Payer: Self-pay | Admitting: Gynecologic Oncology

## 2021-01-16 VITALS — BP 145/54 | HR 77 | Temp 98.0°F | Resp 16 | Ht 59.0 in | Wt 162.0 lb

## 2021-01-16 DIAGNOSIS — N898 Other specified noninflammatory disorders of vagina: Secondary | ICD-10-CM | POA: Insufficient documentation

## 2021-01-16 DIAGNOSIS — Z90722 Acquired absence of ovaries, bilateral: Secondary | ICD-10-CM | POA: Insufficient documentation

## 2021-01-16 DIAGNOSIS — C541 Malignant neoplasm of endometrium: Secondary | ICD-10-CM

## 2021-01-16 DIAGNOSIS — Z923 Personal history of irradiation: Secondary | ICD-10-CM | POA: Insufficient documentation

## 2021-01-16 DIAGNOSIS — Z90710 Acquired absence of both cervix and uterus: Secondary | ICD-10-CM | POA: Insufficient documentation

## 2021-01-16 NOTE — Patient Instructions (Signed)
No lesions or concerning areas seen in the vagina or the vulva on today's examination. Radiation changes to the vagina were noted on the exam, which is expected given your recent radiation one year ago. Continue use of the dilator with lubrication. If the pink discharge increases, or you develop new symptoms such as vaginal bleeding, abdominal/pelvic pain, change in your bowel or bladder habits, please call the office so you can be seen at [phone].  Plan to follow up in December 2022 or sooner if needed. Please call for any needs.

## 2021-01-16 NOTE — Telephone Encounter (Signed)
Spoke with patient today. 

## 2021-01-17 ENCOUNTER — Ambulatory Visit (INDEPENDENT_AMBULATORY_CARE_PROVIDER_SITE_OTHER): Payer: Medicare Other

## 2021-01-17 ENCOUNTER — Encounter: Payer: Self-pay | Admitting: Internal Medicine

## 2021-01-17 DIAGNOSIS — Z7901 Long term (current) use of anticoagulants: Secondary | ICD-10-CM

## 2021-01-17 DIAGNOSIS — Z86718 Personal history of other venous thrombosis and embolism: Secondary | ICD-10-CM

## 2021-01-17 LAB — POCT INR: INR: 1.9 — AB (ref 2.0–3.0)

## 2021-01-17 NOTE — Patient Instructions (Addendum)
Pre visit review using our clinic review tool, if applicable. No additional management support is needed unless otherwise documented below in the visit note.  Take 1 tablet today and then continue to take 1/2 tablet daily except 1 tablet on Mondays.   Re-check in to 6 weeks.

## 2021-01-17 NOTE — Progress Notes (Signed)
Outside notes received. Information abstracted. Notes sent to scan.  

## 2021-01-18 ENCOUNTER — Encounter: Payer: Self-pay | Admitting: Gastroenterology

## 2021-01-18 NOTE — Progress Notes (Signed)
Agree with management.  Advith Martine J Larz Mark, MD  

## 2021-01-21 ENCOUNTER — Ambulatory Visit: Payer: Medicare Other | Admitting: Cardiology

## 2021-01-21 ENCOUNTER — Encounter: Payer: Self-pay | Admitting: Cardiology

## 2021-01-21 ENCOUNTER — Other Ambulatory Visit: Payer: Self-pay

## 2021-01-21 VITALS — BP 138/60 | HR 64 | Ht 59.0 in | Wt 165.0 lb

## 2021-01-21 DIAGNOSIS — R0602 Shortness of breath: Secondary | ICD-10-CM

## 2021-01-21 DIAGNOSIS — I5189 Other ill-defined heart diseases: Secondary | ICD-10-CM | POA: Diagnosis not present

## 2021-01-21 DIAGNOSIS — E7800 Pure hypercholesterolemia, unspecified: Secondary | ICD-10-CM | POA: Diagnosis not present

## 2021-01-21 DIAGNOSIS — I10 Essential (primary) hypertension: Secondary | ICD-10-CM

## 2021-01-21 NOTE — Progress Notes (Signed)
Cardiology Office Note:    Date:  01/21/2021   ID:  NOA GALVAO, DOB [birth year], MRN [id number]  PCP:  [name], MD   [facility] Cardiologist:  Kate Sable, MD     Referring MD: [name], MD   Chief Complaint  Patient presents with   Other    6 week follow up -- Meds reviewed verbally with patient.     History of Present Illness:    Bonnie Zhang is a 80 y.o. 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  Previously seen due to shortness of breath sometimes associated with exertion.  Echocardiogram showed grade 2 diastolic dysfunction, and Lexiscan Myoview showed no significant ischemia.  Heart symptoms of fatigue, heart rates were in the 40s.  Toprol-XL was discontinued to help with symptoms of fatigue, Aldactone increased for BP control and also diastolic dysfunction.  Her blood pressures have been well controlled, denies edema.  She feels much better since stopping metoprolol.  Prior notes Echo 09/2020 EF 60 to 64%, grade 2 diastolic dysfunction. Lexiscan 09/2020, no evidence for ischemia. Echo 3329 normal systolic function, impaired relaxation, EF 55 to 60%   Past Medical History:  Diagnosis Date   Anticoagulated on Coumadin 2001   2 blood clots   Bruises easily    pt is on Coumadin;last dose of Coumadin 08/05/11 and then Lovenox started 08/07/11   DVT (deep vein thrombosis) in pregnancy     1965 post C section;2001 with prolonged driving while on  HRT   DVT (deep venous thrombosis) ([place]) 2001   Was on Prempro.     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 Gery Pray   Homocysteinemia  01/23/2015   Hx of migraines    yrs ago    Hypertension    takes Benazepril,Amlodipine,and Metoprolol daily   Osteoporosis    PONV (postoperative nausea and vomiting)    Primary localized osteoarthritis of left knee    PVD (peripheral vascular disease) ([place]) 1965, 2001   abnormal venous doppler findings due to recurrent DVT'S left leg   Right knee DJD    knees   Shortness of breath dyspnea    TIA (transient ischemic attack) 2020   Vitamin B12 deficiency (non anemic) 01/24/2015   Vitamin D deficiency    takes VIt D daily    Past Surgical History:  Procedure Laterality Date   ABDOMINAL HYSTERECTOMY     APPENDECTOMY  04/01/1963   BREAST LUMPECTOMY  03/31/1985   CESAREAN SECTION  1961/1965/1966   X 3   CHOLECYSTECTOMY  04/01/1995   COLONOSCOPY W/ POLYPECTOMY  03/31/2005   Dr  Sharlett Iles; due? 2012   [place]  03/31/2010   uterine polyp, Dr Kennon Rounds   ESOPHAGOGASTRODUODENOSCOPY  04/01/1995   HYSTEROSCOPY WITH D & C N/A 08/05/2012   Procedure: DILATATION AND CURETTAGE /HYSTEROSCOPY;  Surgeon: Donnamae Jude, MD;  Location: [facility];  Service: Gynecology;  Laterality: N/A;   ROBOTIC ASSISTED TOTAL HYSTERECTOMY WITH BILATERAL SALPINGO OOPHERECTOMY Right 09/14/2012   Procedure: ROBOTIC ASSISTED TOTAL HYSTERECTOMY WITH BILATERAL SALPINGO OOPHORECTOMY ,right pelvic LYMPHNODE dissection;  Surgeon: Imagene Gurney A.  Alycia Rossetti, MD;  Location: [facility];  Service: Gynecology;  Laterality: Right;   TOTAL KNEE ARTHROPLASTY  08/11/2011   Procedure: TOTAL KNEE ARTHROPLASTY;  Surgeon: Lorn Junes, MD;  Location: [facility];  Service: Orthopedics;  Laterality: Right;  DR Ephriam Knuckles 90 MINUTES FOR THIS CASE   TOTAL KNEE ARTHROPLASTY Left 09/11/2014   Procedure: TOTAL KNEE ARTHROPLASTY;  Surgeon: Elsie Saas, MD;  Location: [facility];  Service: Orthopedics;  Laterality: Left;   WISDOM TOOTH EXTRACTION      Current Medications: Current Meds  Medication Sig   acetaminophen (TYLENOL) 500 MG  tablet Take 500 mg by mouth every 6 (six) hours as needed.   amLODipine (NORVASC) 10 MG tablet Take 1 tablet (10 mg total) by mouth daily.   benazepril (LOTENSIN) 40 MG tablet TAKE 1 TABLET BY MOUTH DAILY.   cholecalciferol (VITAMIN D3) 25 MCG (1000 UNIT) tablet Take 1,000 Units by mouth daily.   pantoprazole (PROTONIX) 40 MG tablet TAKE 1 TABLET BY MOUTH ONCE DAILY AS NEEDED FOR ACID REFLUX   simvastatin (ZOCOR) 10 MG tablet TAKE 1 TABLET BY MOUTH AT BEDTIME.   spironolactone (ALDACTONE) 25 MG tablet Take 1 tablet (25 mg total) by mouth daily.   vitamin B-12 (CYANOCOBALAMIN) 1000 MCG tablet Take 1,000 mcg by mouth daily.   warfarin (COUMADIN) 5 MG tablet Take 1/2 tablet daily except take 1 tablet on Mon and Thurs or take as directed by anticoagulation clinic     Allergies:   Vicodin [hydrocodone-acetaminophen], Aspirin, Hctz [hydrochlorothiazide], and Lipitor [atorvastatin]   Social History   Socioeconomic History   Marital status: Divorced    Spouse name: Not on file   Number of children: 2   Years of education: Not on file   Highest education level: High school graduate  Occupational History   Occupation: Retired  Tobacco Use   Smoking status: Never   Smokeless tobacco: Never  Vaping Use   Vaping Use: Never used  Substance and Sexual Activity   Alcohol use: No   Drug use: No   Sexual activity: Not Currently    Birth control/protection: Post-menopausal    Comment: retired. has 2 daughters  Other Topics Concern   Not on file  Social History Narrative   Pt lives alone she has 2 daughter - right handed- drinks tea, soda sometimes - No regular exercise   Social Determinants of Health   Financial Resource Strain: Not on file  Food Insecurity: Not on file  Transportation Needs: Not on file  Physical Activity: Not on file  Stress: Not on file  Social Connections: Not on file     Family History: The patient's family history includes Colon cancer in her maternal  grandmother; Deep vein thrombosis in her mother; Diabetes in an other family member; Heart failure in her maternal grandfather; Hypertension in her mother and sister; Kidney disease in her mother; Leukemia in her father; Liver disease in her brother. There is no history of Anesthesia problems, Hypotension, Malignant hyperthermia, Pseudochol deficiency, Stroke, Heart disease, Breast cancer, Ovarian cancer, Uterine cancer, Pancreatic cancer, or Cancer - Prostate.  ROS:   Please see the history of present illness.     All other systems reviewed and are negative.  EKGs/Labs/Other Studies Reviewed:    The following studies were reviewed today:   EKG:  EKG is ordered today.  EKG shows normal sinus rhythm, right bundle branch block.  Unchanged from prior.   Recent Labs: 03/08/2020: TSH 1.48 09/26/2020: ALT 8; BUN 15; Creatinine, Ser  0.99; Hemoglobin 13.5; Platelets 178.0; Potassium 4.4; Sodium 139  Recent Lipid Panel    Component Value Date/Time   CHOL 153 09/03/2020 1041   CHOL 178 05/25/2014 1121   TRIG 142.0 09/03/2020 1041   TRIG 138 05/25/2014 1121   HDL 60.10 09/03/2020 1041   HDL 76 05/25/2014 1121   CHOLHDL 3 09/03/2020 1041   VLDL 28.4 09/03/2020 1041   LDLCALC 64 09/03/2020 1041   LDLCALC 74 05/25/2014 1121     Risk Assessment/Calculations:          Physical Exam:    VS:  BP 138/60 (BP Location: Left Arm, Patient Position: Sitting, Cuff Size: Normal)   Pulse 64   Ht 4\' 11"  (1.499 m)   Wt 165 lb (74.8 kg)   SpO2 98%   BMI 33.33 kg/m     Wt Readings from Last 3 Encounters:  01/21/21 165 lb (74.8 kg)  01/16/21 162 lb (73.5 kg)  01/11/21 163 lb (73.9 kg)     GEN:  Well nourished, well developed in no acute distress HEENT: Normal NECK: No JVD; No carotid bruits LYMPHATICS: No lymphadenopathy CARDIAC: RRR, no murmurs, rubs, gallops RESPIRATORY:  Clear to auscultation without rales, wheezing or rhonchi  ABDOMEN: Soft, non-tender, non-distended MUSCULOSKELETAL:   No edema; No deformity  SKIN: Warm and dry NEUROLOGIC:  Alert and oriented x 3 PSYCHIATRIC:  Normal affect   ASSESSMENT:    1. Shortness of breath   2. Diastolic dysfunction   3. Primary hypertension   4. Pure hypercholesterolemia      PLAN:    In order of problems listed above:  Shortness of breath, fatigue, improved since stopping beta-blocker.  Bradycardia/heart rates now normalized.  [place] with no significant ischemia.  EF preserved. Grade 2 diastolic dysfunction, euvolemic.  Continue spironolactone 25 mg daily. Hypertension, BP controlled.  Continue Aldactone to 25 mg daily, Norvasc, benazepril as prescribed. Hyperlipidemia, cholesterol controlled.  Continue simvastatin.  Follow-up yearly.  Medication Adjustments/Labs and Tests Ordered: Current medicines are reviewed at length with the patient today.  Concerns regarding medicines are outlined above.  Orders Placed This Encounter  Procedures   EKG 12-Lead    No orders of the defined types were placed in this encounter.    Patient Instructions  Medication Instructions:  Your physician recommends that you continue on your current medications as directed. Please refer to the Current Medication list given to you today.  *If you need a refill on your cardiac medications before your next appointment, please call your pharmacy*   Lab Work: None ordered If you have labs (blood work) drawn today and your tests are completely normal, you will receive your results only by: [place] (if you have MyChart) OR A paper copy in the mail If you have any lab test that is abnormal or we need to change your treatment, we will call you to review the results.   Testing/Procedures: None ordered   Follow-Up: At [facility], you and your health needs are our priority.  As part of our continuing mission to provide you with exceptional heart care, we have created designated Provider Care Teams.  These Care Teams  include your primary Cardiologist (physician) and Advanced Practice Providers (APPs -  Physician Assistants and Nurse Practitioners) who all work together to provide you with the care you need, when you need it.  We recommend signing up for the patient portal called "MyChart".  Sign up information is provided on this After Visit Summary.  MyChart is  used to connect with patients for Virtual Visits (Telemedicine).  Patients are able to view lab/test results, encounter notes, upcoming appointments, etc.  Non-urgent messages can be sent to your provider as well.   To learn more about what you can do with MyChart, go to [email or web address].    Your next appointment:   1 year(s)  The format for your next appointment:   In Person  Provider:   You may see Kate Sable, MD or one of the following Advanced Practice Providers on your designated Care Team:   Murray Hodgkins, NP Christell Faith, PA-C Marrianne Mood, PA-C Cadence [place], [place]   Other Instructions     Signed, Kate Sable, MD  01/21/2021 1:24 PM    [place]

## 2021-01-21 NOTE — Patient Instructions (Signed)
Medication Instructions:  Your physician recommends that you continue on your current medications as directed. Please refer to the Current Medication list given to you today.  *If you need a refill on your cardiac medications before your next appointment, please call your pharmacy*   Lab Work: None ordered If you have labs (blood work) drawn today and your tests are completely normal, you will receive your results only by: [place] (if you have MyChart) OR A paper copy in the mail If you have any lab test that is abnormal or we need to change your treatment, we will call you to review the results.   Testing/Procedures: None ordered   Follow-Up: At [facility], you and your health needs are our priority.  As part of our continuing mission to provide you with exceptional heart care, we have created designated Provider Care Teams.  These Care Teams include your primary Cardiologist (physician) and Advanced Practice Providers (APPs -  Physician Assistants and Nurse Practitioners) who all work together to provide you with the care you need, when you need it.  We recommend signing up for the patient portal called "MyChart".  Sign up information is provided on this After Visit Summary.  MyChart is used to connect with patients for Virtual Visits (Telemedicine).  Patients are able to view lab/test results, encounter notes, upcoming appointments, etc.  Non-urgent messages can be sent to your provider as well.   To learn more about what you can do with MyChart, go to [email or web address].    Your next appointment:   1 year(s)  The format for your next appointment:   In Person  Provider:   You may see Kate Sable, MD or one of the following Advanced Practice Providers on your designated Care Team:   Murray Hodgkins, NP Christell Faith, PA-C Marrianne Mood, PA-C Cadence Kathlen [place], [place]   Other Instructions

## 2021-01-22 ENCOUNTER — Ambulatory Visit: Payer: Medicare Other

## 2021-02-27 ENCOUNTER — Encounter: Payer: Self-pay | Admitting: Gynecologic Oncology

## 2021-02-28 ENCOUNTER — Ambulatory Visit (INDEPENDENT_AMBULATORY_CARE_PROVIDER_SITE_OTHER): Payer: Medicare Other

## 2021-02-28 ENCOUNTER — Other Ambulatory Visit: Payer: Self-pay

## 2021-02-28 DIAGNOSIS — Z7901 Long term (current) use of anticoagulants: Secondary | ICD-10-CM

## 2021-02-28 LAB — POCT INR: INR: 1.6 — AB (ref 2.0–3.0)

## 2021-02-28 NOTE — Progress Notes (Signed)
Increase dose today and tomorrow to 1 tablet each day and then change weekly dose to take 1/2 tablet daily except 1 tablet on Mondays and Thursdays.   Re-check in to 2 weeks.

## 2021-02-28 NOTE — Patient Instructions (Addendum)
Pre visit review using our clinic review tool, if applicable. No additional management support is needed unless otherwise documented below in the visit note.  Increase dose today and tomorrow to 1 tablet each day and then change weekly dose to take 1/2 tablet daily except 1 tablet on Mondays and Thursdays.   Re-check in to 2 weeks.

## 2021-03-01 ENCOUNTER — Encounter: Payer: Self-pay | Admitting: Gynecologic Oncology

## 2021-03-01 ENCOUNTER — Inpatient Hospital Stay: Payer: Medicare Other | Attending: Gynecologic Oncology

## 2021-03-01 ENCOUNTER — Telehealth: Payer: Self-pay

## 2021-03-01 ENCOUNTER — Inpatient Hospital Stay (HOSPITAL_BASED_OUTPATIENT_CLINIC_OR_DEPARTMENT_OTHER): Payer: Medicare Other | Admitting: Gynecologic Oncology

## 2021-03-01 VITALS — BP 139/62 | HR 65 | Temp 97.5°F | Resp 16 | Ht 59.0 in | Wt 159.4 lb

## 2021-03-01 DIAGNOSIS — Z86718 Personal history of other venous thrombosis and embolism: Secondary | ICD-10-CM | POA: Diagnosis not present

## 2021-03-01 DIAGNOSIS — C541 Malignant neoplasm of endometrium: Secondary | ICD-10-CM

## 2021-03-01 DIAGNOSIS — I10 Essential (primary) hypertension: Secondary | ICD-10-CM | POA: Diagnosis not present

## 2021-03-01 DIAGNOSIS — M810 Age-related osteoporosis without current pathological fracture: Secondary | ICD-10-CM | POA: Diagnosis not present

## 2021-03-01 DIAGNOSIS — Z923 Personal history of irradiation: Secondary | ICD-10-CM | POA: Diagnosis not present

## 2021-03-01 DIAGNOSIS — R634 Abnormal weight loss: Secondary | ICD-10-CM

## 2021-03-01 DIAGNOSIS — E7800 Pure hypercholesterolemia, unspecified: Secondary | ICD-10-CM | POA: Insufficient documentation

## 2021-03-01 DIAGNOSIS — I739 Peripheral vascular disease, unspecified: Secondary | ICD-10-CM | POA: Diagnosis not present

## 2021-03-01 DIAGNOSIS — Z79899 Other long term (current) drug therapy: Secondary | ICD-10-CM | POA: Insufficient documentation

## 2021-03-01 DIAGNOSIS — Z8673 Personal history of transient ischemic attack (TIA), and cerebral infarction without residual deficits: Secondary | ICD-10-CM | POA: Insufficient documentation

## 2021-03-01 DIAGNOSIS — Z90722 Acquired absence of ovaries, bilateral: Secondary | ICD-10-CM | POA: Diagnosis not present

## 2021-03-01 DIAGNOSIS — Z7901 Long term (current) use of anticoagulants: Secondary | ICD-10-CM | POA: Diagnosis not present

## 2021-03-01 DIAGNOSIS — Z8542 Personal history of malignant neoplasm of other parts of uterus: Secondary | ICD-10-CM | POA: Insufficient documentation

## 2021-03-01 DIAGNOSIS — R350 Frequency of micturition: Secondary | ICD-10-CM | POA: Diagnosis not present

## 2021-03-01 DIAGNOSIS — K219 Gastro-esophageal reflux disease without esophagitis: Secondary | ICD-10-CM | POA: Insufficient documentation

## 2021-03-01 DIAGNOSIS — Z90710 Acquired absence of both cervix and uterus: Secondary | ICD-10-CM | POA: Diagnosis not present

## 2021-03-01 LAB — BASIC METABOLIC PANEL - CANCER CENTER ONLY
Anion gap: 12 (ref 5–15)
BUN: 22 mg/dL (ref 8–23)
CO2: 19 mmol/L — ABNORMAL LOW (ref 22–32)
Calcium: 9.6 mg/dL (ref 8.9–10.3)
Chloride: 110 mmol/L (ref 98–111)
Creatinine: 1.23 mg/dL — ABNORMAL HIGH (ref 0.44–1.00)
GFR, Estimated: 45 mL/min — ABNORMAL LOW (ref >60)
Glucose, Bld: 101 mg/dL — ABNORMAL HIGH (ref 70–99)
Potassium: 3.8 mmol/L (ref 3.5–5.1)
Sodium: 141 mmol/L (ref 135–145)

## 2021-03-01 NOTE — Patient Instructions (Signed)
It was good to see you today.  I do not see or feel any evidence of cancer recurrence on your exam.  Given your unintentional weight loss, we will plan to get a CT of your abdomen and pelvis to rule out cancer recurrence.  I will call you when I get these results.  If CT ends up being negative for any evidence of cancer, then we will plan to continue with follow-up visits every 3 months.  You will see radiation oncology at your next visit and come back to see me in 6 months.  If you need anything between now and then or you develop any symptoms concerning for cancer recurrence that we have discussed, please call me at [phone].

## 2021-03-01 NOTE — Telephone Encounter (Signed)
Told Bonnie Zhang that her Creatine was a little elevated today at 1.23. Dr.Tucker wants her to increase her fluid intake to 64 oz daily of caffeine free fluid as she could be dehydrated. She should probably follow-up with her primary care doctor for this especially if there is nothing seen on her upcoming CT scan. Pt verbalized understanding.

## 2021-03-01 NOTE — Progress Notes (Signed)
Gynecologic Oncology Return Clinic Visit  03/01/21  Reason for Visit: surveillance visit in the setting of endometrial cancer  Treatment History: Oncology History Overview Note  MMR IHC intact ER/PR strongly positive   Endometrial cancer ([place])  08/09/2019 Initial Biopsy   Vaginal biopsy: Endometrioid adenoca, FIGO gr 2   08/19/2019 Initial Diagnosis   Endometrial cancer ([place])   09/06/2019 - 11/23/2019 Radiation Therapy   Site Technique Total Dose (Gy) Dose per Fx (Gy) Completed Fx Beam Energies  Vagina: Pelvis_Bst HDR-brachy 24/24 6 4/4 Ir-192  Vagina: Pelvis IMRT 45/45 1.8 25/25 6X       2012: Endometrial sampling showed benign endometrium, polyps 08/05/2012: D&C and hysteroscopy, pathology revealed at least atypical complex endometrial hyperplasia with a focal area worrisome for well differentiated endometrioid carcinoma 09/14/2012: Total robotic hysterectomy, BSO, right pelvic lymph node dissection.  Frozen section with no evidence of cancer, possible hyperplasia.  Final pathology revealed extensive atypical complex hyperplasia.  4 right lymph nodes negative for malignancy. 08/09/2019: Patient presented to her OB/GYN with an episode of pink discharge followed by frank bleeding after using a douche.  Since that time has had continued brown discharge. 08/09/2019: Vaginal biopsy shows endometrioid adenocarcinoma  Interval History: Patient presents today for surveillance follow-up.  She last saw Dr. Sondra Come and in August.  She called this fall because she was having some bleeding with vaginal dilator use.  On exam here in our clinic, no lesions were noted.  She reports that this has improved and she only now has occasional pink spots intermittently when she uses the dilator.  She is still using the dilator 3 times a week.  She denies any other vaginal bleeding or discharge.  She notes overall some decrease in appetite and about 20 pounds overall of unintentional weight loss.  She just does not  feel as hungry.  She notes normal bowel function.  She has some increased urinary frequency at night, which has worsened a little since prior to treatment.  She recently saw her cardiologist secondary to multiple symptoms including fatigue.  Her cardiac medications have been changed and she is feeling better.  She also describes that since June or July she is starting having some flushing episodes again which she describes as feeling like she has burning up and turns red.  She is spoken to her gastroenterologist about doing urine hormone testing.  Updated DEXA scan done in October.  Past Medical/Surgical History: Past Medical History:  Diagnosis Date   Anticoagulated on Coumadin 2001   2 blood clots   Bruises easily    pt is on Coumadin;last dose of Coumadin 08/05/11 and then Lovenox started 08/07/11   DVT (deep vein thrombosis) in pregnancy     1965 post C section;2001 with prolonged driving while on  HRT   DVT (deep venous thrombosis) ([place]) 2001   Was on Prempro.     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 Gery Pray   Homocysteinemia 01/23/2015   Hx of migraines    yrs ago    Hypertension    takes Benazepril,Amlodipine,and Metoprolol daily   Osteoporosis    PONV (postoperative nausea and vomiting)    Primary localized osteoarthritis of left knee  PVD (peripheral vascular disease) ([place]) 1965, 2001   abnormal venous doppler findings due to recurrent DVT'S left leg   Right knee DJD    knees   Shortness of breath dyspnea    TIA (transient ischemic attack) 2020   Vitamin B12 deficiency (non anemic) 01/24/2015   Vitamin D deficiency    takes VIt D daily    Past Surgical History:  Procedure Laterality Date    ABDOMINAL HYSTERECTOMY     APPENDECTOMY  04/01/1963   BREAST LUMPECTOMY  03/31/1985   CESAREAN SECTION  1961/1965/1966   X 3   CHOLECYSTECTOMY  04/01/1995   COLONOSCOPY W/ POLYPECTOMY  03/31/2005   Dr  Sharlett Iles; due? 2012   [place]  03/31/2010   uterine polyp, Dr Kennon Rounds   ESOPHAGOGASTRODUODENOSCOPY  04/01/1995   HYSTEROSCOPY WITH D & C N/A 08/05/2012   Procedure: DILATATION AND CURETTAGE /HYSTEROSCOPY;  Surgeon: Donnamae Jude, MD;  Location: [facility];  Service: Gynecology;  Laterality: N/A;   ROBOTIC ASSISTED TOTAL HYSTERECTOMY WITH BILATERAL SALPINGO OOPHERECTOMY Right 09/14/2012   Procedure: ROBOTIC ASSISTED TOTAL HYSTERECTOMY WITH BILATERAL SALPINGO OOPHORECTOMY ,right pelvic LYMPHNODE dissection;  Surgeon: Imagene Gurney A. Alycia Rossetti, MD;  Location: [facility];  Service: Gynecology;  Laterality: Right;   TOTAL KNEE ARTHROPLASTY  08/11/2011   Procedure: TOTAL KNEE ARTHROPLASTY;  Surgeon: Lorn Junes, MD;  Location: [facility];  Service: Orthopedics;  Laterality: Right;  DR Ephriam Knuckles 90 MINUTES FOR THIS CASE   TOTAL KNEE ARTHROPLASTY Left 09/11/2014   Procedure: TOTAL KNEE ARTHROPLASTY;  Surgeon: Elsie Saas, MD;  Location: [facility];  Service: Orthopedics;  Laterality: Left;   WISDOM TOOTH EXTRACTION      Family History  Problem Relation Age of Onset   Kidney disease Mother        ? hypertensive   Hypertension Mother    Deep vein thrombosis Mother        post ankle fracture   Leukemia Father    Hypertension Sister    Liver disease Brother    Colon cancer Maternal Grandmother    Heart failure Maternal Grandfather    Diabetes Other        cousins   Anesthesia problems Neg Hx    Hypotension Neg Hx    Malignant hyperthermia Neg Hx    Pseudochol deficiency Neg Hx    Stroke Neg Hx    Heart disease Neg Hx    Breast cancer Neg Hx    Ovarian cancer Neg Hx    Uterine cancer Neg Hx    Pancreatic cancer Neg Hx    Cancer - Prostate Neg Hx     Social History    Socioeconomic History   Marital status: Divorced    Spouse name: Not on file   Number of children: 2   Years of education: Not on file   Highest education level: High school graduate  Occupational History   Occupation: Retired  Tobacco Use   Smoking status: Never   Smokeless tobacco: Never  Vaping Use   Vaping Use: Never used  Substance and Sexual Activity   Alcohol use: No   Drug use: No   Sexual activity: Not Currently    Birth control/protection: Post-menopausal    Comment: retired. has 2 daughters  Other Topics Concern   Not on file  Social History Narrative   Pt lives alone she has 2 daughter - right handed- drinks tea, soda sometimes - No regular exercise   Social Determinants of Health  Financial Resource Strain: Not on file  Food Insecurity: Not on file  Transportation Needs: Not on file  Physical Activity: Not on file  Stress: Not on file  Social Connections: Not on file    Current Medications:  Current Outpatient Medications:    acetaminophen (TYLENOL) 500 MG tablet, Take 500 mg by mouth every 6 (six) hours as needed., Disp: , Rfl:    amLODipine (NORVASC) 10 MG tablet, Take 1 tablet (10 mg total) by mouth daily., Disp: 30 tablet, Rfl: 3   benazepril (LOTENSIN) 40 MG tablet, TAKE 1 TABLET BY MOUTH DAILY., Disp: 90 tablet, Rfl: 1   cholecalciferol (VITAMIN D3) 25 MCG (1000 UNIT) tablet, Take 1,000 Units by mouth daily., Disp: , Rfl:    pantoprazole (PROTONIX) 40 MG tablet, TAKE 1 TABLET BY MOUTH ONCE DAILY AS NEEDED FOR ACID REFLUX, Disp: 90 tablet, Rfl: 0   simvastatin (ZOCOR) 10 MG tablet, TAKE 1 TABLET BY MOUTH AT BEDTIME., Disp: 90 tablet, Rfl: 1   spironolactone (ALDACTONE) 25 MG tablet, Take 1 tablet (25 mg total) by mouth daily., Disp: 90 tablet, Rfl: 1   vitamin B-12 (CYANOCOBALAMIN) 1000 MCG tablet, Take 1,000 mcg by mouth daily., Disp: , Rfl:    warfarin (COUMADIN) 5 MG tablet, Take 1/2 tablet daily except take 1 tablet on Mon and Thurs or take as  directed by anticoagulation clinic, Disp: 90 tablet, Rfl: 1  Review of Systems: Pertinent positives include flushing, weight loss, urinary frequency. Denies appetite changes, fevers, chills, fatigue. Denies hearing loss, neck lumps or masses, mouth sores, ringing in ears or voice changes. Denies cough or wheezing.  Denies shortness of breath. Denies chest pain or palpitations. Denies leg swelling. Denies abdominal distention, pain, blood in stools, constipation, diarrhea, nausea, vomiting, or early satiety. Denies pain with intercourse, dysuria, hematuria or incontinence. Denies pelvic pain, vaginal bleeding or vaginal discharge.   Denies joint pain, back pain or muscle pain/cramps. Denies itching, rash, or wounds. Denies dizziness, headaches, numbness or seizures. Denies swollen lymph nodes or glands, denies easy bruising or bleeding. Denies anxiety, depression, confusion, or decreased concentration.  Physical Exam: BP 139/62 (BP Location: Right Arm, Patient Position: Sitting)   Pulse 65   Temp (!) 97.5 F (36.4 C) (Tympanic)   Resp 16   Ht 4' 11"  (1.499 m)   Wt 159 lb 6.4 oz (72.3 kg)   SpO2 98%   BMI 32.19 kg/m  General: Alert, oriented, no acute distress. HEENT: Normocephalic, atraumatic, sclera anicteric. Chest: Clear to auscultation bilaterally.  No wheezes or rhonchi. Cardiovascular: Regular rate and rhythm, no murmurs. Abdomen: Obese, soft, nontender.  Normoactive bowel sounds.  No masses or hepatosplenomegaly appreciated.  Well-healed scar.  Extremities: Grossly normal range of motion.  Warm, well perfused.  No edema bilaterally. Skin: No rashes or lesions noted. Lymphatics: No cervical, supraclavicular, or inguinal adenopathy. GU: Normal appearing external genitalia without erythema, excoriation, or lesions.  Several small, hyperpigmented lesions on her vulva that appear to be hemangiomas.  Speculum exam reveals atrophic vaginal mucosa, especially at the apex, consistent  with radiation changes.  No masses or lesions.  No bleeding or discharge.  Pediatric speculum necessary to staining apex of the vagina.  Some vascular congestion noted along the posterior aspect of her vagina at the introitus, unchanged from her last exam.  Bimanual exam reveals some narrowing of the apex of the vagina secondary to radiation, no masses or nodularity, tissue smooth.  Rectovaginal exam confirms findings.  Laboratory & Radiologic Studies: None new  Assessment & Plan: Bonnie Zhang is a 80 y.o. woman with grade 2 endometrioid adenocarcinoma presenting at the vaginal cuff after prior hysterectomy for complex atypical hyperplasia who who completed definitive radiation treatment in August 2021.   The patient continues to do very well.  She is NED on exam today.  Given unintentional weight loss, with 11 pound weight loss documented in our system since her last visit with me in June, I recommended that we proceed with a CT scan of her abdomen and pelvis to rule out recurrent disease.  This was scheduled today.  I will call her with the results.  In terms of her episodes of redness and flushing, I recommended that she speak again with her gastroenterologist about work-up for this.  From a hormonal standpoint, she is well past menopause and seems unlikely that symptoms are related to hormonal changes.  We reviewed again that given the narrowing at the top of the vagina, continued dilator use is very important.  She has been diligent about doing this.  She is having small amount of spotting intermittently when she uses the dilator, overall decreased in the last several months.  She will let me know if she starts having more bleeding.  I suspect that this is all related to radiation changes and atrophy.   Per NCCN surveillance recommendations, I recommended surveillance visits every 3 months for the first 2-3 years and then every 6 months until 5 years.  We will alternate visits between my  clinic and radiation oncology.  She will see Dr. Sondra Come in 3 months and present back for a visit with me in 6 months. She knows to call the clinic if she develops any concerning symptoms before her next scheduled visit.  32 minutes of total time was spent for this patient encounter, including preparation, face-to-face counseling with the patient and coordination of care, and documentation of the encounter.  Jeral Pinch, MD  Division of Gynecologic Oncology  Department of Obstetrics and Gynecology  [facility]

## 2021-03-04 ENCOUNTER — Encounter: Payer: Self-pay | Admitting: Internal Medicine

## 2021-03-04 NOTE — Progress Notes (Signed)
Subjective:    Patient ID: Bonnie Zhang, female    DOB: Jul 08, 1940, 80 y.o.   MRN: [id number]  This visit occurred during the SARS-CoV-2 public health emergency.  Safety protocols were in place, including screening questions prior to the visit, additional usage of staff PPE, and extensive cleaning of exam room while observing appropriate contact time as indicated for disinfecting solutions.     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  She is scheduled for a CT scan by oncology in 2 days.  She did not review the bone density scan.  Medications and allergies reviewed with patient and updated if appropriate.  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 10/09/2010   Osteoporosis 03/21/2010   History of colonic polyps 03/20/2008   Vitamin D deficiency 08/25/2007   HYPERLIPIDEMIA 96/28/3662   Dysmetabolic syndrome X 94/76/5465   Essential (primary) hypertension 03/18/2007   DVT, HX OF 07/23/2006     Current Outpatient Medications on File Prior to Visit  Medication Sig Dispense Refill   acetaminophen (TYLENOL) 500 MG tablet Take 500 mg by mouth every 6 (six) hours as needed.     amLODipine (NORVASC) 10 MG tablet Take 1 tablet (10 mg total) by mouth daily. 30 tablet 3   benazepril (LOTENSIN) 40 MG tablet TAKE 1 TABLET BY MOUTH DAILY. 90 tablet 1   cholecalciferol (VITAMIN D3) 25 MCG (1000 UNIT) tablet Take 2,000 Units by mouth daily.     pantoprazole (PROTONIX) 40 MG tablet TAKE 1 TABLET BY MOUTH ONCE DAILY AS NEEDED FOR ACID REFLUX 90 tablet 0   simvastatin (ZOCOR) 10 MG tablet TAKE 1 TABLET BY MOUTH AT BEDTIME. 90 tablet 1   spironolactone (ALDACTONE) 25 MG tablet Take 1 tablet (25 mg total) by mouth daily. 90 tablet 1   vitamin B-12 (CYANOCOBALAMIN) 1000 MCG tablet Take 1,000 mcg by mouth daily.     warfarin (COUMADIN) 5 MG tablet Take 1/2 tablet daily except take 1 tablet on Mon and Thurs or take as directed by anticoagulation clinic 90 tablet 1   No current facility-administered medications on file prior to visit.    Past Medical History:  Diagnosis Date   Anticoagulated on Coumadin 2001   2 blood clots   Bruises easily    pt is on Coumadin;last dose of Coumadin 08/05/11 and then Lovenox started 08/07/11   DVT (deep vein thrombosis) in pregnancy     1965 post C section;2001 with prolonged driving while on  HRT   DVT (deep venous thrombosis) ([place]) 2001  Was on Prempro.     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 Gery Pray   Homocysteinemia 01/23/2015   Hx of migraines    yrs ago    Hypertension    takes Benazepril,Amlodipine,and Metoprolol daily   Osteoporosis    PONV (postoperative nausea and  vomiting)    Primary localized osteoarthritis of left knee    PVD (peripheral vascular disease) ([place]) 1965, 2001   abnormal venous doppler findings due to recurrent DVT'S left leg   Right knee DJD    knees   Shortness of breath dyspnea    TIA (transient ischemic attack) 2020   Vitamin B12 deficiency (non anemic) 01/24/2015   Vitamin D deficiency    takes VIt D daily    Past Surgical History:  Procedure Laterality Date   ABDOMINAL HYSTERECTOMY     APPENDECTOMY  04/01/1963   BREAST LUMPECTOMY  03/31/1985   CESAREAN SECTION  1961/1965/1966   X 3   CHOLECYSTECTOMY  04/01/1995   COLONOSCOPY W/ POLYPECTOMY  03/31/2005   Dr  Sharlett Iles; due? 2012   [place]  03/31/2010   uterine polyp, Dr Kennon Rounds   ESOPHAGOGASTRODUODENOSCOPY  04/01/1995   HYSTEROSCOPY WITH D & C N/A 08/05/2012   Procedure: DILATATION AND CURETTAGE /HYSTEROSCOPY;  Surgeon: Donnamae Jude, MD;  Location: [facility];  Service: Gynecology;  Laterality: N/A;   ROBOTIC ASSISTED TOTAL HYSTERECTOMY WITH BILATERAL SALPINGO OOPHERECTOMY Right 09/14/2012   Procedure: ROBOTIC ASSISTED TOTAL HYSTERECTOMY WITH BILATERAL SALPINGO OOPHORECTOMY ,right pelvic LYMPHNODE dissection;  Surgeon: Imagene Gurney A. Alycia Rossetti, MD;  Location: [facility];  Service: Gynecology;  Laterality: Right;   TOTAL KNEE ARTHROPLASTY  08/11/2011   Procedure: TOTAL KNEE ARTHROPLASTY;  Surgeon: Lorn Junes, MD;  Location: [facility];  Service: Orthopedics;  Laterality: Right;  DR Ephriam Knuckles 90 MINUTES FOR THIS CASE   TOTAL KNEE ARTHROPLASTY Left 09/11/2014   Procedure: TOTAL KNEE ARTHROPLASTY;  Surgeon: Elsie Saas, MD;  Location: [place];  Service: Orthopedics;  Laterality: Left;   WISDOM TOOTH EXTRACTION      Social History   Socioeconomic History   Marital status: Divorced    Spouse name: Not on file   Number of children: 2   Years of education: Not on file   Highest education level: High school graduate  Occupational History   Occupation: Retired   Tobacco Use   Smoking status: Never   Smokeless tobacco: Never  Vaping Use   Vaping Use: Never used  Substance and Sexual Activity   Alcohol use: No   Drug use: No   Sexual activity: Not Currently    Birth control/protection: Post-menopausal    Comment: retired. has 2 daughters  Other Topics Concern   Not on file  Social History Narrative   Pt lives alone she has 2 daughter - right handed- drinks tea, soda sometimes - No regular exercise   Social Determinants of Health   Financial Resource Strain: Not on file  Food Insecurity: Not on file  Transportation Needs: Not on file  Physical Activity: Not on file  Stress: Not on file  Social Connections: Not on file    Family History  Problem Relation Age of Onset  Kidney disease Mother        ? hypertensive   Hypertension Mother    Deep vein thrombosis Mother        post ankle fracture   Leukemia Father    Hypertension Sister    Liver disease Brother    Colon cancer Maternal Grandmother    Heart failure Maternal Grandfather    Diabetes Other        cousins   Anesthesia problems Neg Hx    Hypotension Neg Hx    Malignant hyperthermia Neg Hx    Pseudochol deficiency Neg Hx    Stroke Neg Hx    Heart disease Neg Hx    Breast cancer Neg Hx    Ovarian cancer Neg Hx    Uterine cancer Neg Hx    Pancreatic cancer Neg Hx    Cancer - Prostate Neg Hx     Review of Systems  Constitutional:  Negative for chills and fever.  Respiratory:  Positive for shortness of breath (mild - with exertion or singing). Negative for cough and wheezing.   Cardiovascular:  Negative for chest pain, palpitations and leg swelling.  Neurological:  Positive for headaches (occ). Negative for light-headedness.      Objective:   Vitals:   03/05/21 1004  BP: 120/68  Pulse: (!) 52  Temp: 98 F (36.7 C)  SpO2: 97%   BP Readings from Last 3 Encounters:  03/05/21 120/68  03/01/21 139/62  01/21/21 138/60   Wt Readings from Last 3 Encounters:   03/05/21 160 lb (72.6 kg)  03/01/21 159 lb 6.4 oz (72.3 kg)  01/21/21 165 lb (74.8 kg)   Body mass index is 32.32 kg/m.   Physical Exam    Constitutional: Appears well-developed and well-nourished. No distress.  HENT:  Head: Normocephalic and atraumatic.  Neck: Neck supple. No tracheal deviation present. No thyromegaly present.  No cervical lymphadenopathy Cardiovascular: Normal rate, regular rhythm and normal heart sounds.   No murmur heard. No carotid bruit .  No edema Pulmonary/Chest: Effort normal and breath sounds normal. No respiratory distress. No has no wheezes. No rales.  Skin: Skin is warm and dry. Not diaphoretic.  Psychiatric: Normal mood and affect. Behavior is normal.      Assessment & Plan:    See Problem List for Assessment and Plan of chronic medical problems.

## 2021-03-04 NOTE — Patient Instructions (Addendum)
  Blood work was ordered.     Medications changes include :   start taking calcium 600 mg twice daily.  Your prescription(s) have been submitted to your pharmacy. Please take as directed and contact our office if you believe you are having problem(s) with the medication(s).   A referral was ordered for sports medicine.       Someone from their office will call you to schedule an appointment.    Please followup in 6 months    Think about medication for your bones -- prolia, fosamax and reclast.

## 2021-03-05 ENCOUNTER — Other Ambulatory Visit: Payer: Self-pay

## 2021-03-05 ENCOUNTER — Ambulatory Visit (INDEPENDENT_AMBULATORY_CARE_PROVIDER_SITE_OTHER): Payer: Medicare Other | Admitting: Internal Medicine

## 2021-03-05 VITALS — BP 120/68 | HR 52 | Temp 98.0°F | Ht 59.0 in | Wt 160.0 lb

## 2021-03-05 DIAGNOSIS — Z86718 Personal history of other venous thrombosis and embolism: Secondary | ICD-10-CM

## 2021-03-05 DIAGNOSIS — D6851 Activated protein C resistance: Secondary | ICD-10-CM | POA: Diagnosis not present

## 2021-03-05 DIAGNOSIS — M549 Dorsalgia, unspecified: Secondary | ICD-10-CM | POA: Diagnosis not present

## 2021-03-05 DIAGNOSIS — K219 Gastro-esophageal reflux disease without esophagitis: Secondary | ICD-10-CM | POA: Diagnosis not present

## 2021-03-05 DIAGNOSIS — M810 Age-related osteoporosis without current pathological fracture: Secondary | ICD-10-CM | POA: Diagnosis not present

## 2021-03-05 DIAGNOSIS — E559 Vitamin D deficiency, unspecified: Secondary | ICD-10-CM

## 2021-03-05 DIAGNOSIS — E782 Mixed hyperlipidemia: Secondary | ICD-10-CM

## 2021-03-05 DIAGNOSIS — I10 Essential (primary) hypertension: Secondary | ICD-10-CM | POA: Diagnosis not present

## 2021-03-05 DIAGNOSIS — R739 Hyperglycemia, unspecified: Secondary | ICD-10-CM

## 2021-03-05 LAB — BASIC METABOLIC PANEL
BUN: 16 mg/dL (ref 6–23)
CO2: 25 mEq/L (ref 19–32)
Calcium: 10.4 mg/dL (ref 8.4–10.5)
Chloride: 107 mEq/L (ref 96–112)
Creatinine, Ser: 1.1 mg/dL (ref 0.40–1.20)
GFR: 47.79 mL/min — ABNORMAL LOW (ref >60.00)
Glucose, Bld: 104 mg/dL — ABNORMAL HIGH (ref 70–99)
Potassium: 3.9 mEq/L (ref 3.5–5.1)
Sodium: 141 mEq/L (ref 135–145)

## 2021-03-05 LAB — CBC WITH DIFFERENTIAL/PLATELET
Basophils Absolute: 0 10*3/uL (ref 0.0–0.1)
Basophils Relative: 0.1 % (ref 0.0–3.0)
Eosinophils Absolute: 0 10*3/uL (ref 0.0–0.7)
Eosinophils Relative: 0.8 % (ref 0.0–5.0)
HCT: 41.2 % (ref 36.0–46.0)
Hemoglobin: 13.4 g/dL (ref 12.0–15.0)
Lymphocytes Relative: 25.9 % (ref 12.0–46.0)
Lymphs Abs: 1.3 10*3/uL (ref 0.7–4.0)
MCHC: 32.5 g/dL (ref 30.0–36.0)
MCV: 91.1 fl (ref 78.0–100.0)
Monocytes Absolute: 0.4 10*3/uL (ref 0.1–1.0)
Monocytes Relative: 8 % (ref 3.0–12.0)
Neutro Abs: 3.3 10*3/uL (ref 1.4–7.7)
Neutrophils Relative %: 65.2 % (ref 43.0–77.0)
Platelets: 216 10*3/uL (ref 150.0–400.0)
RBC: 4.52 Mil/uL (ref 3.87–5.11)
RDW: 12.9 % (ref 11.5–15.5)
WBC: 5 10*3/uL (ref 4.0–10.5)

## 2021-03-05 LAB — LIPID PANEL
Cholesterol: 151 mg/dL (ref 0–200)
HDL: 71.2 mg/dL (ref >39.00)
LDL Cholesterol: 55 mg/dL (ref 0–99)
NonHDL: 79.51
Total CHOL/HDL Ratio: 2
Triglycerides: 122 mg/dL (ref 0.0–149.0)
VLDL: 24.4 mg/dL (ref 0.0–40.0)

## 2021-03-05 LAB — HEMOGLOBIN A1C: Hgb A1c MFr Bld: 5.3 % (ref 4.6–6.5)

## 2021-03-05 LAB — VITAMIN D 25 HYDROXY (VIT D DEFICIENCY, FRACTURES): VITD: 29.5 ng/mL — ABNORMAL LOW (ref 30.00–100.00)

## 2021-03-05 MED ORDER — AMLODIPINE BESYLATE 10 MG PO TABS: 10.0000 mg | ORAL_TABLET | Freq: Every day | ORAL | 3 refills | Status: DC

## 2021-03-05 NOTE — Assessment & Plan Note (Addendum)
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

## 2021-03-05 NOTE — Assessment & Plan Note (Addendum)
Chronic Lipid panel Regular exercise and healthy diet encouraged Continue simvastatin 10 mg daily

## 2021-03-05 NOTE — Assessment & Plan Note (Signed)
Chronic GERD controlled Continue pantoprazole 40 mg daily as needed 

## 2021-03-05 NOTE — Assessment & Plan Note (Addendum)
Chronic Discussed increased risk of fracture Taking vitamin d 2000 units daily Start calcium 600 mg twice daily Discussed medications including prolia, fosamax and reclast-depending on kidney function she may need to consider Prolia versus the other 2 Advised regular exercise Vitamin d level to be checked today

## 2021-03-05 NOTE — Assessment & Plan Note (Addendum)
Subacute X-ray few months ago was normal Still has pain with activity Will refer to sports medicine

## 2021-03-05 NOTE — Assessment & Plan Note (Addendum)
Chronic a1c Low sugar / carb diet Stressed regular exercise

## 2021-03-05 NOTE — Assessment & Plan Note (Addendum)
Chronic Blood pressure well controlled BMP Continue amlodipine 10 mg daily, benazepril 40 mg daily, spironolactone 25 mg daily

## 2021-03-05 NOTE — Assessment & Plan Note (Addendum)
Chronic History of DVT x2 in left lower extremity Lifelong anticoagulation with warfarin Following at our Coumadin clinic for management cbc

## 2021-03-05 NOTE — Assessment & Plan Note (Signed)
Chronic Taking vitamin D daily Check vitamin D level  

## 2021-03-07 ENCOUNTER — Telehealth: Payer: Self-pay | Admitting: Gynecologic Oncology

## 2021-03-07 ENCOUNTER — Encounter (HOSPITAL_COMMUNITY): Payer: Self-pay

## 2021-03-07 ENCOUNTER — Ambulatory Visit (HOSPITAL_COMMUNITY)
Admission: RE | Admit: 2021-03-07 | Discharge: 2021-03-07 | Disposition: A | Discharge status: Home / Self Care | Payer: Medicare Other | Source: Ambulatory Visit | Attending: Gynecologic Oncology | Admitting: Gynecologic Oncology

## 2021-03-07 ENCOUNTER — Other Ambulatory Visit: Payer: Self-pay

## 2021-03-07 DIAGNOSIS — R634 Abnormal weight loss: Secondary | ICD-10-CM | POA: Diagnosis not present

## 2021-03-07 DIAGNOSIS — C541 Malignant neoplasm of endometrium: Secondary | ICD-10-CM | POA: Insufficient documentation

## 2021-03-07 DIAGNOSIS — K449 Diaphragmatic hernia without obstruction or gangrene: Secondary | ICD-10-CM | POA: Diagnosis not present

## 2021-03-07 DIAGNOSIS — D179 Benign lipomatous neoplasm, unspecified: Secondary | ICD-10-CM | POA: Diagnosis not present

## 2021-03-07 MED ORDER — SODIUM CHLORIDE (PF) 0.9 % IJ SOLN
INTRAMUSCULAR | Status: AC
  Filled 2021-03-07: qty 50

## 2021-03-07 MED ORDER — IOHEXOL 350 MG/ML SOLN
75.0000 mL | Freq: Once | INTRAVENOUS | Status: AC | PRN
  Administered 2021-03-07: 75 mL via INTRAVENOUS

## 2021-03-07 NOTE — Telephone Encounter (Signed)
Called the patient to discuss CT results - no evidence of metastatic/recurrent cancer. Some findings in stomach, may benefit from GI evaluation/endoscopy.  Jeral Pinch MD Gynecologic Oncology

## 2021-03-07 NOTE — Telephone Encounter (Signed)
Spoke with patient about CT. No recurrent cancer. Discussed stomach findings. Other than decreased appetite, she denies pain, burning, indigestion, or other symptoms.  Jeral Pinch MD Gynecologic Oncology

## 2021-03-13 ENCOUNTER — Ambulatory Visit (INDEPENDENT_AMBULATORY_CARE_PROVIDER_SITE_OTHER): Payer: Medicare Other

## 2021-03-13 ENCOUNTER — Other Ambulatory Visit: Payer: Self-pay

## 2021-03-13 DIAGNOSIS — Z0000 Encounter for general adult medical examination without abnormal findings: Secondary | ICD-10-CM

## 2021-03-13 NOTE — Progress Notes (Signed)
I connected with Bonnie Zhang today by telephone and verified that I am speaking with the correct person using two identifiers. Location patient: home Location provider: work Persons participating in the virtual visit: patient, provider.   I discussed the limitations, risks, security and privacy concerns of performing an evaluation and management service by telephone and the availability of in person appointments. I also discussed with the patient that there may be a patient responsible charge related to this service. The patient expressed understanding and verbally consented to this telephonic visit.    Interactive audio and video telecommunications were attempted between this provider and patient, however failed, due to patient having technical difficulties OR patient did not have access to video capability.  We continued and completed visit with audio only.  Some vital signs may be absent or patient reported.   Time Spent with patient on telephone encounter: 40 minutes  Subjective:   [name] is a 80 y.o. female who presents for Medicare Annual (Subsequent) preventive examination.  Review of Systems     Cardiac Risk Factors include: advanced age (>25men, >64 women);hypertension;dyslipidemia;family history of premature cardiovascular disease     Objective:    There were no vitals filed for this visit. There is no height or weight on file to calculate BMI.  Advanced Directives 03/13/2021 02/27/2021 01/16/2021 12/04/2020 11/22/2020 08/24/2020 05/24/2020  Does Patient Have a Medical Advance Directive? Yes Yes Yes Yes Yes Yes Yes  Type of Advance Directive Living will Living will - Living will Living will [place];Living will [place];Living will  Does patient want to make changes to medical advance directive? - - No - Patient declined - No - Patient declined - -  Copy of [place] in Chart? - - - - - Yes - validated  most recent copy scanned in chart (See row information) No - copy requested  Pre-existing out of facility DNR order (yellow form or pink MOST form) - - - - - - -    Current Medications (verified) Outpatient Encounter Medications as of 03/13/2021  Medication Sig   acetaminophen (TYLENOL) 500 MG tablet Take 500 mg by mouth every 6 (six) hours as needed.   amLODipine (NORVASC) 10 MG tablet Take 1 tablet (10 mg total) by mouth daily.   benazepril (LOTENSIN) 40 MG tablet TAKE 1 TABLET BY MOUTH DAILY.   cholecalciferol (VITAMIN D3) 25 MCG (1000 UNIT) tablet Take 2,000 Units by mouth daily.   pantoprazole (PROTONIX) 40 MG tablet TAKE 1 TABLET BY MOUTH ONCE DAILY AS NEEDED FOR ACID REFLUX   simvastatin (ZOCOR) 10 MG tablet TAKE 1 TABLET BY MOUTH AT BEDTIME.   spironolactone (ALDACTONE) 25 MG tablet Take 1 tablet (25 mg total) by mouth daily.   vitamin B-12 (CYANOCOBALAMIN) 1000 MCG tablet Take 1,000 mcg by mouth daily.   warfarin (COUMADIN) 5 MG tablet Take 1/2 tablet daily except take 1 tablet on Mon and Thurs or take as directed by anticoagulation clinic   No facility-administered encounter medications on file as of 03/13/2021.    Allergies (verified) Vicodin [hydrocodone-acetaminophen], Aspirin, Hctz [hydrochlorothiazide], and Lipitor [atorvastatin]   History: Past Medical History:  Diagnosis Date   Anticoagulated on Coumadin 2001   2 blood clots   Bruises easily    pt is on Coumadin;last dose of Coumadin 08/05/11 and then Lovenox started 08/07/11   DVT (deep vein thrombosis) in pregnancy     1965 post C section;2001 with prolonged driving while on  HRT  DVT (deep venous thrombosis) ([place]) 2001   Was on Prempro.     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 Gery Pray   Homocysteinemia 01/23/2015   Hx of migraines    yrs ago    Hypertension    takes Benazepril,Amlodipine,and Metoprolol daily   Osteoporosis    PONV (postoperative nausea and vomiting)    Primary localized osteoarthritis of left knee    PVD (peripheral vascular disease) ([place]) 1965, 2001   abnormal venous doppler findings due to recurrent DVT'S left leg   Right knee DJD    knees   Shortness of breath dyspnea    TIA (transient ischemic attack) 2020   Vitamin B12 deficiency (non anemic) 01/24/2015   Vitamin D deficiency    takes VIt D daily   Past Surgical History:  Procedure Laterality Date   ABDOMINAL HYSTERECTOMY     APPENDECTOMY  04/01/1963   BREAST LUMPECTOMY  03/31/1985   CESAREAN SECTION  1961/1965/1966   X 3   CHOLECYSTECTOMY  04/01/1995   COLONOSCOPY W/ POLYPECTOMY  03/31/2005   Dr  Sharlett Iles; due? 2012   [place]  03/31/2010   uterine polyp, Dr Kennon Rounds   ESOPHAGOGASTRODUODENOSCOPY  04/01/1995   HYSTEROSCOPY WITH D & C N/A 08/05/2012   Procedure: DILATATION AND CURETTAGE /HYSTEROSCOPY;  Surgeon: Donnamae Jude, MD;  Location: [facility];  Service: Gynecology;  Laterality: N/A;   ROBOTIC ASSISTED TOTAL HYSTERECTOMY WITH BILATERAL SALPINGO OOPHERECTOMY Right 09/14/2012   Procedure: ROBOTIC ASSISTED TOTAL HYSTERECTOMY WITH BILATERAL SALPINGO OOPHORECTOMY ,right pelvic LYMPHNODE dissection;  Surgeon: Imagene Gurney A. Alycia Rossetti, MD;  Location: [facility];  Service: Gynecology;  Laterality: Right;   TOTAL KNEE ARTHROPLASTY  08/11/2011   Procedure: TOTAL KNEE ARTHROPLASTY;  Surgeon: Lorn Junes, MD;  Location: [facility];  Service: Orthopedics;  Laterality: Right;  DR Ephriam Knuckles 90 MINUTES FOR THIS CASE   TOTAL KNEE ARTHROPLASTY Left 09/11/2014   Procedure: TOTAL KNEE ARTHROPLASTY;  Surgeon: Elsie Saas, MD;  Location: [facility];  Service: Orthopedics;  Laterality: Left;   WISDOM TOOTH EXTRACTION     Family History   Problem Relation Age of Onset   Kidney disease Mother        ? hypertensive   Hypertension Mother    Deep vein thrombosis Mother        post ankle fracture   Leukemia Father    Hypertension Sister    Liver disease Brother    Colon cancer Maternal Grandmother    Heart failure Maternal Grandfather    Diabetes Other        cousins   Anesthesia problems Neg Hx    Hypotension Neg Hx    Malignant hyperthermia Neg Hx    Pseudochol deficiency Neg Hx    Stroke Neg Hx    Heart disease Neg Hx    Breast cancer Neg Hx    Ovarian cancer Neg Hx    Uterine cancer Neg Hx    Pancreatic cancer Neg Hx    Cancer - Prostate Neg Hx    Social History   Socioeconomic History   Marital status: Divorced    Spouse name: Not on file   Number of children:  2   Years of education: Not on file   Highest education level: High school graduate  Occupational History   Occupation: Retired  Tobacco Use   Smoking status: Never   Smokeless tobacco: Never  Vaping Use   Vaping Use: Never used  Substance and Sexual Activity   Alcohol use: No   Drug use: No   Sexual activity: Not Currently    Birth control/protection: Post-menopausal    Comment: retired. has 2 daughters  Other Topics Concern   Not on file  Social History Narrative   Pt lives alone she has 2 daughter - right handed- drinks tea, soda sometimes - No regular exercise   Social Determinants of Health   Financial Resource Strain: Low Risk    Difficulty of Paying Living Expenses: Not hard at all  Food Insecurity: No Food Insecurity   Worried About Charity fundraiser in the Last Year: Never true   Ran Out of Food in the Last Year: Never true  Transportation Needs: No Transportation Needs   Lack of Transportation (Medical): No   Lack of Transportation (Non-Medical): No  Physical Activity: Sufficiently Active   Days of Exercise per Week: 5 days   Minutes of Exercise per Session: 30 min  Stress: No Stress Concern Present   Feeling of  Stress : Not at all  Social Connections: Moderately Integrated   Frequency of Communication with Friends and Family: More than three times a week   Frequency of Social Gatherings with Friends and Family: More than three times a week   Attends Religious Services: More than 4 times per year   Active Member of [employer] or Organizations: Yes   Attends Archivist Meetings: More than 4 times per year   Marital Status: Widowed    Tobacco Counseling Counseling given: Not Answered   Clinical Intake:  Pre-visit preparation completed: Yes  Pain : No/denies pain     Nutritional Risks: None Diabetes: No  How often do you need to have someone help you when you read instructions, pamphlets, or other written materials from your doctor or pharmacy?: 1 - Never What is the last grade level you completed in school?: HSG  Diabetic? no  Interpreter Needed?: No  Information entered by :: Lisette Abu, LPN   Activities of Daily Living In your present state of health, do you have any difficulty performing the following activities: 03/13/2021 03/05/2021  Hearing? N N  Vision? N N  Difficulty concentrating or making decisions? N N  Walking or climbing stairs? N N  Dressing or bathing? N N  Doing errands, shopping? N N  Preparing Food and eating ? N -  Using the Toilet? N -  In the past six months, have you accidently leaked urine? N -  Do you have problems with loss of bowel control? N -  Managing your Medications? N -  Managing your Finances? N -  Housekeeping or managing your Housekeeping? N -  Some recent data might be hidden    Patient Care Team: [name], MD as PCP - General (Internal Medicine) Kate Sable, MD as PCP - Cardiology (Cardiology) Josue Hector, MD as Consulting Physician (Cardiology) Heath Lark, MD as Consulting Physician (Hematology and Oncology) Elsie Saas, MD as Consulting Physician (Orthopedic Surgery) Pieter Partridge, DO as Consulting  Physician (Neurology) Charlton Haws, [facility] as Pharmacist (Pharmacist)  Indicate any recent Medical Services you may have received from other than [facility] providers in the past year (date may  be approximate).     Assessment:   This is a routine wellness examination for [facility].  Hearing/Vision screen Hearing Screening - Comments:: Patient denied any hearing difficulty. No hearing aids needed. Vision Screening - Comments:: Patient wears bifocals for seeing.  Annual eye done at: [facility].  Dietary issues and exercise activities discussed: Current Exercise Habits: Home exercise routine, Type of exercise: walking, Time (Minutes): 30, Frequency (Times/Week): 5, Weekly Exercise (Minutes/Week): 150, Intensity: Mild, Exercise limited by: orthopedic condition(s)   Goals Addressed   None   Depression Screen PHQ 2/9 Scores 03/13/2021 09/26/2020 01/13/2020 01/11/2019 08/20/2018 01/07/2018 12/29/2017  PHQ - 2 Score 0 0 0 0 0 0 0  PHQ- 9 Score - - - - - 0 3    Fall Risk Fall Risk  03/13/2021 12/04/2020 09/26/2020 01/13/2020 01/26/2019  Falls in the past year? 0 0 0 0 0  Number falls in past yr: 0 0 0 0 0  Injury with Fall? 0 0 0 0 0  Risk for fall due to : No Fall Risks - - No Fall Risks -  Follow up Falls prevention discussed - - Falls evaluation completed -    FALL RISK PREVENTION PERTAINING TO THE HOME:  Any stairs in or around the home? No  If so, are there any without handrails? No  Home free of loose throw rugs in walkways, pet beds, electrical cords, etc? Yes  Adequate lighting in your home to reduce risk of falls? Yes   ASSISTIVE DEVICES UTILIZED TO PREVENT FALLS:  Life alert? No  Use of a cane, walker or w/c? No  Grab bars in the bathroom? Yes  Shower chair or bench in shower? Yes  Elevated toilet seat or a handicapped toilet? Yes   TIMED UP AND GO:  Was the test performed? No .  Length of time to ambulate 10 feet: n/a sec.   Gait steady and fast without use  of assistive device  Cognitive Function: Normal cognitive status assessed by direct observation by this Nurse Health Advisor. No abnormalities found.   MMSE - Mini Mental State Exam 01/07/2018 12/22/2016  Orientation to time 5 5  Orientation to Place 5 5  Registration 3 3  Attention/ Calculation 5 5  Recall 3 2  Language- name 2 objects 2 2  Language- repeat 1 1  Language- follow 3 step command 3 3  Language- read & follow direction 1 1  Write a sentence 1 1  Copy design 1 1  Total score 30 29        Immunizations Immunization History  Administered Date(s) Administered   Janssen (J&J) SARS-COV-2 Vaccination 07/08/2019   PFIZER Comirnaty(Gray Top)Covid-19 Tri-Sucrose Vaccine 04/04/2020   Td 03/21/2010    TDAP status: Due, Education has been provided regarding the importance of this vaccine. Advised may receive this vaccine at local pharmacy or Health Dept. Aware to provide a copy of the vaccination record if obtained from local pharmacy or Health Dept. Verbalized acceptance and understanding.  Flu Vaccine status: Declined, Education has been provided regarding the importance of this vaccine but patient still declined. Advised may receive this vaccine at local pharmacy or Health Dept. Aware to provide a copy of the vaccination record if obtained from local pharmacy or Health Dept. Verbalized acceptance and understanding.  Pneumococcal vaccine status: Declined,  Education has been provided regarding the importance of this vaccine but patient still declined. Advised may receive this vaccine at local pharmacy or Health Dept. Aware to provide a copy  of the vaccination record if obtained from local pharmacy or Health Dept. Verbalized acceptance and understanding.   Covid-19 vaccine status: Completed vaccines  Qualifies for Shingles Vaccine? Yes   Zostavax completed No   Shingrix Completed?: No.    Education has been provided regarding the importance of this vaccine. Patient has been  advised to call insurance company to determine out of pocket expense if they have not yet received this vaccine. Advised may also receive vaccine at local pharmacy or Health Dept. Verbalized acceptance and understanding.  Screening Tests Health Maintenance  Topic Date Due   COVID-19 Vaccine (3 - Booster for Janssen series) 03/21/2021 (Originally 05/30/2020)   Zoster Vaccines- Shingrix (1 of 2) 06/03/2021 (Originally 08/28/1959)   INFLUENZA VACCINE  06/28/2021 (Originally 10/29/2020)   TETANUS/TDAP  09/26/2021 (Originally 03/21/2020)   Pneumonia Vaccine 14+ Years old (1 - PCV) 03/05/2022 (Originally 08/29/1946)   Hepatitis C Screening  03/05/2065 (Originally 08/29/1958)   DEXA SCAN  01/03/2023   HPV VACCINES  Aged Out    Health Maintenance  There are no preventive care reminders to display for this patient.  Colorectal cancer screening: Type of screening: Colonoscopy. Completed 01/11/2021. Repeat every 0 years  Mammogram status: Completed 12/11/2020. Repeat every year  Bone Density status: Completed 01/02/2021. Results reflect: Bone density results: OSTEOPOROSIS. Repeat every 2 years.  Lung Cancer Screening: (Low Dose CT Chest recommended if Age 45-80 years, 30 pack-year currently smoking OR have quit w/in 15years.) does not qualify.   Lung Cancer Screening Referral: no  Additional Screening:  Hepatitis C Screening: does qualify; Completed no  Vision Screening: Recommended annual ophthalmology exams for early detection of glaucoma and other disorders of the eye. Is the patient up to date with their annual eye exam?  Yes  Who is the provider or what is the name of the office in which the patient attends annual eye exams? [facility] If pt is not established with a provider, would they like to be referred to a provider to establish care? No .   Dental Screening: Recommended annual dental exams for proper oral hygiene  Community Resource Referral / Chronic Care Management: CRR  required this visit?  No   CCM required this visit?  No      Plan:     I have personally reviewed and noted the following in the patients chart:   Medical and social history Use of alcohol, tobacco or illicit drugs  Current medications and supplements including opioid prescriptions.  Functional ability and status Nutritional status Physical activity Advanced directives List of other physicians Hospitalizations, surgeries, and ER visits in previous 12 months Vitals Screenings to include cognitive, depression, and falls Referrals and appointments  In addition, I have reviewed and discussed with patient certain preventive protocols, quality metrics, and best practice recommendations. A written personalized care plan for preventive services as well as general preventive health recommendations were provided to patient.     Sheral Flow, LPN   [id number]   Nurse Notes:  Patient is cogitatively intact. There were no vitals filed for this visit. There is no height or weight on file to calculate BMI. Hearing Screening - Comments:: Patient denied any hearing difficulty. No hearing aids needed. Vision Screening - Comments:: Patient wears bifocals for seeing.  Annual eye done at: [place].

## 2021-03-14 ENCOUNTER — Ambulatory Visit: Payer: Medicare Other | Admitting: Family Medicine

## 2021-03-14 ENCOUNTER — Ambulatory Visit (INDEPENDENT_AMBULATORY_CARE_PROVIDER_SITE_OTHER): Payer: Medicare Other

## 2021-03-14 ENCOUNTER — Encounter: Payer: Self-pay | Admitting: Family Medicine

## 2021-03-14 VITALS — BP 128/50 | HR 45 | Ht 59.0 in | Wt 161.8 lb

## 2021-03-14 DIAGNOSIS — G8929 Other chronic pain: Secondary | ICD-10-CM | POA: Diagnosis not present

## 2021-03-14 DIAGNOSIS — Z7901 Long term (current) use of anticoagulants: Secondary | ICD-10-CM

## 2021-03-14 DIAGNOSIS — M546 Pain in thoracic spine: Secondary | ICD-10-CM | POA: Diagnosis not present

## 2021-03-14 DIAGNOSIS — M810 Age-related osteoporosis without current pathological fracture: Secondary | ICD-10-CM | POA: Diagnosis not present

## 2021-03-14 LAB — POCT INR: INR: 3.2 — AB (ref 2.0–3.0)

## 2021-03-14 NOTE — Patient Instructions (Addendum)
Pre visit review using our clinic review tool, if applicable. No additional management support is needed unless otherwise documented below in the visit note.  Hold dose tomorrow and then continue 1/2 tablet daily except 1 tablet on Mondays and Thursdays.   Re-check in to 4 weeks.

## 2021-03-14 NOTE — Progress Notes (Signed)
° °  I, [name], LAT, ATC, am serving as scribe for Dr. Lynne Leader.  Subjective:    CC: Thoracic back pain  HPI: Pt is a 80 y/o female c/o intermittent thoracic back pain x few months w/ no known MOI. Pt locates pain to her mid-back near her bra strap line. Pain has been ongoing since at least September.  Pain is intermittent and worse with activity better with rest.  No radiating pain weakness or numbness.  She notes pertinent history for endometrial carcinoma and significant osteoporosis.  Osteoporosis managed along with calcium and vitamin D.   Radiating pain: No Aggravates: prolonged walking; making her bed in the morning;  Treatments tried: heat; Tylenol  Dx imaging: 12/05/20 T-spine XR  Pertinent review of Systems: No fevers or chills  Relevant historical information: Endometrial cancer.  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   Objective:    Vitals:   03/14/21 1427  BP: (!) 128/50  Pulse: (!) 45  SpO2: 97%   General: Well Developed, well nourished, and in no acute distress.   MSK: T-spine nontender midline.  Normal thoracic motion.  Upper and lower extremity strength is intact.  Lab and Radiology Results   EXAM: THORACIC SPINE - 3 VIEWS   COMPARISON:  None.   FINDINGS: Normal alignment. The vertebral body heights and disc spaces are maintained. No acute fracture. Normal mineralization. Surgical clips in the right upper quadrant. Osteopenia.   IMPRESSION: No malalignment or acute fracture.     Electronically Signed   By: Albin Felling M.D.   On: 12/06/2020 09:16 I, Lynne Leader, personally (independently) visualized and performed the interpretation of the images attached in this note.   DEXA scan obtained October 2022 showing T score of -3.2.  Last vitamin D Lab Results  Component Value Date   VD25OH 29.50 (L) 03/05/2021     Chemistry      Component Value Date/Time   NA 141 03/05/2021 1048   K 3.9 03/05/2021 1048   CL 107 03/05/2021 1048   CO2 25  03/05/2021 1048   BUN 16 03/05/2021 1048   CREATININE 1.10 03/05/2021 1048   CREATININE 1.23 (H) 03/01/2021 1340      Component Value Date/Time   CALCIUM 10.4 03/05/2021 1048   ALKPHOS 98 09/26/2020 1138   AST 13 09/26/2020 1138   ALT 8 09/26/2020 1138   BILITOT 0.7 09/26/2020 1138        Impression and Recommendations:    Assessment and Plan: 80 y.o. female with  Midthoracic back pain thought to be due to spinal muscle dysfunction and some degenerative changes.  Patient is a great candidate for physical therapy.  Plan to refer to PT and reassess in about 6 weeks.  Additionally she has significant osteoporosis.  She already is on vitamin D and calcium.  Plan for Prolia.Marland Kitchen  PDMP not reviewed this encounter. Orders Placed This Encounter  Procedures   Ambulatory referral to Physical Therapy    Referral Priority:   Routine    Referral Type:   Physical Medicine    Referral Reason:   Specialty Services Required    Requested Specialty:   Physical Therapy    Number of Visits Requested:   1   No orders of the defined types were placed in this encounter.   Discussed warning signs or symptoms. Please see discharge instructions. Patient expresses understanding.   The above documentation has been reviewed and is accurate and complete Lynne Leader, M.D.

## 2021-03-14 NOTE — Progress Notes (Signed)
Hold dose tomorrow and then continue 1/2 tablet daily except 1 tablet on Mondays and Thursdays.   Re-check in to 4 weeks.

## 2021-03-14 NOTE — Patient Instructions (Addendum)
Nice to meet you today.  I've referred you to PT to [facility].  Please let us know if you haven't heard from them regarding scheduling within one week.  We'll contact you about the Prolia once we hear back from your insurance.  Follow-up: 6 weeks

## 2021-03-15 ENCOUNTER — Telehealth: Payer: Self-pay | Admitting: Family Medicine

## 2021-03-15 NOTE — Telephone Encounter (Signed)
Patient has been submitted for verification of benefits for Prolia.

## 2021-03-25 ENCOUNTER — Other Ambulatory Visit: Payer: Self-pay | Admitting: Internal Medicine

## 2021-04-05 NOTE — Telephone Encounter (Signed)
Resubmitted for benefits for the first of the year

## 2021-04-09 ENCOUNTER — Ambulatory Visit: Payer: Medicare Other

## 2021-04-09 DIAGNOSIS — M546 Pain in thoracic spine: Secondary | ICD-10-CM | POA: Diagnosis not present

## 2021-04-09 DIAGNOSIS — M6281 Muscle weakness (generalized): Secondary | ICD-10-CM | POA: Diagnosis not present

## 2021-04-11 ENCOUNTER — Other Ambulatory Visit: Payer: Self-pay

## 2021-04-11 ENCOUNTER — Ambulatory Visit (INDEPENDENT_AMBULATORY_CARE_PROVIDER_SITE_OTHER): Payer: Medicare Other

## 2021-04-11 DIAGNOSIS — Z7901 Long term (current) use of anticoagulants: Secondary | ICD-10-CM

## 2021-04-11 LAB — POCT INR: INR: 2.6 (ref 2.0–3.0)

## 2021-04-11 NOTE — Patient Instructions (Addendum)
Pre visit review using our clinic review tool, if applicable. No additional management support is needed unless otherwise documented below in the visit note.  Continue 1/2 tablet daily except 1 tablet on Mondays and Thursdays.   Re-check in to  5 weeks.

## 2021-04-11 NOTE — Progress Notes (Signed)
Continue 1/2 tablet daily except 1 tablet on Mondays and Thursdays.   Re-check in to  5 weeks.

## 2021-04-17 DIAGNOSIS — M6281 Muscle weakness (generalized): Secondary | ICD-10-CM | POA: Diagnosis not present

## 2021-04-17 DIAGNOSIS — M546 Pain in thoracic spine: Secondary | ICD-10-CM | POA: Diagnosis not present

## 2021-04-24 NOTE — Progress Notes (Deleted)
° °  I, [name], LAT, ATC, am serving as scribe for Dr. Lynne Leader.  Bonnie Zhang is a 81 y.o. female who presents to [place] [facility] today for f/u of chronic mid-back pain thought to be due to spinal muscle dysfunction and some degenerative changes.  She was last seen by Dr. Georgina Snell on 03/14/21 and was referred to PT at [facility].  She was also to be authorized for Prolia due to significant osteoporosis.  Today, pt reports  Diagnostic testing: CT abdomen/pelvis- 03/07/21; T-spine XR- 12/05/20  Pertinent review of systems: ***  Relevant historical information: ***   Exam:  There were no vitals taken for this visit. General: Well Developed, well nourished, and in no acute distress.   MSK: ***    Lab and Radiology Results No results found for this or any previous visit (from the past 72 hour(s)). No results found.     Assessment and Plan: 81 y.o. female with ***   PDMP not reviewed this encounter. No orders of the defined types were placed in this encounter.  No orders of the defined types were placed in this encounter.    Discussed warning signs or symptoms. Please see discharge instructions. Patient expresses understanding.   ***

## 2021-04-25 ENCOUNTER — Ambulatory Visit: Payer: Medicare Other | Admitting: Family Medicine

## 2021-04-25 NOTE — Telephone Encounter (Signed)
PA was needed. Per the patient's insurance, Bonnie Zhang has been denied. They state that she needs to have used and failed a bisphosphonate medication as well as another injection option. (They are sending over the denial with those suggestions and appeal option).

## 2021-04-26 NOTE — Telephone Encounter (Signed)
Your insurance is not going to approve Prolia without trying oral bisphosphonates and injectable bisphosphonates.  This is going to be incredibly challenging to get justified.  We will talk about this further when I see you in clinic in follow-up.

## 2021-04-30 ENCOUNTER — Ambulatory Visit: Payer: Medicare Other | Admitting: Family Medicine

## 2021-04-30 NOTE — Progress Notes (Signed)
° °  I, Bonnie Zhang, LAT, ATC, am serving as scribe for Dr. Lynne Leader.  Bonnie Zhang is a 81 y.o. female who presents to [facility] today for f/u of intermittent thoracic back pain thought to be due to spinal muscle dysfunction and some degenerative changes.  She has a hx of significant osteoporosis.  She was last seen by Dr. Georgina Snell on 03/14/21 and was referred to [facility] PT.  Prolia was suggested as an option to help manage her osteoporosis but was not authorized by her insurance.  Today, pt reports that her mid-back pain is slightly improved, rating her improvement at 40-50%.  She has completed 3 PT sessions.    Dx imaging: 12/05/20 T-spine XR Pertinent review of systems: No fevers or chills  Relevant historical information: Peripheral vascular disease.  Osteoporosis.  Endometrial cancer history.   Exam:  BP (!) 128/56 (BP Location: Left Arm, Patient Position: Sitting, Cuff Size: Normal)    Pulse (!) 48    Ht 4\' 11"  (1.499 m)    Wt 159 lb (72.1 kg)    SpO2 98%    BMI 32.11 kg/m  General: Well Developed, well nourished, and in no acute distress.   MSK: T-spine: Nontender midline.  Minimal tender paraspinal musculature. Normal thoracic motion.    Lab and Radiology Results EXAM: THORACIC SPINE - 3 VIEWS   COMPARISON:  None.   FINDINGS: Normal alignment. The vertebral body heights and disc spaces are maintained. No acute fracture. Normal mineralization. Surgical clips in the right upper quadrant. Osteopenia.   IMPRESSION: No malalignment or acute fracture.     Electronically Signed   By: Albin Felling M.D.   On: 12/06/2020 09:16   I, Lynne Leader, personally (independently) visualized and performed the interpretation of the images attached in this note.      Assessment and Plan: 81 y.o. female with thoracic back pain.  Improving but not fully improved with a few physical therapy sessions.  She certainly has not had enough therapy at  this time to say that its not going to work sufficiently well enough.  After discussion with Belenda Cruise plan to continue physical therapy for good 6 or 7 sessions along with home exercise program.  If not sufficiently improved at that point next step should probably be repeat x-ray and MRI thoracic spine for potential injection planning.  She will let me know how she is feeling and check back as needed.  Additionally her osteoporosis is not ideally managed.  I have been unable to get Prolia approved as she has not had a sufficient trial of step therapy including alendronate or injectable bisphosphonate.  She is very reluctant to consider these medications due to the concern for osteonecrosis of the jaw.  She is also reluctant to consider Prolia for the same reason. I suggested that we table the matter for now and can check back with this issue as needed.  I will send the letter of rejection to scan and will discuss the matter with her primary care provider.   Discussed warning signs or symptoms. Please see discharge instructions. Patient expresses understanding.   The above documentation has been reviewed and is accurate and complete Lynne Leader, M.D.

## 2021-05-01 ENCOUNTER — Other Ambulatory Visit: Payer: Self-pay

## 2021-05-01 ENCOUNTER — Ambulatory Visit: Payer: Medicare Other | Admitting: Family Medicine

## 2021-05-01 ENCOUNTER — Encounter: Payer: Self-pay | Admitting: Family Medicine

## 2021-05-01 VITALS — BP 128/56 | HR 48 | Ht 59.0 in | Wt 159.0 lb

## 2021-05-01 DIAGNOSIS — G8929 Other chronic pain: Secondary | ICD-10-CM

## 2021-05-01 DIAGNOSIS — M810 Age-related osteoporosis without current pathological fracture: Secondary | ICD-10-CM | POA: Diagnosis not present

## 2021-05-01 DIAGNOSIS — M546 Pain in thoracic spine: Secondary | ICD-10-CM

## 2021-05-01 NOTE — Patient Instructions (Addendum)
Good to see you today.  Con't w/ PT and your home exercises.  Follow-up: as needed

## 2021-05-02 ENCOUNTER — Telehealth: Payer: Self-pay | Admitting: Cardiology

## 2021-05-02 DIAGNOSIS — M546 Pain in thoracic spine: Secondary | ICD-10-CM | POA: Diagnosis not present

## 2021-05-02 DIAGNOSIS — M6281 Muscle weakness (generalized): Secondary | ICD-10-CM | POA: Diagnosis not present

## 2021-05-02 NOTE — Telephone Encounter (Signed)
STAT if HR is under 50 or over 120 (normal HR is 60-100 beats per minute)  What is your heart rate? 45  Do you have a log of your heart rate readings (document readings)?  Ranges from 42-45.   Do you have any other symptoms? Tired, "overdoing nothing". She thinks her medication may need to be adjusted.

## 2021-05-02 NOTE — Telephone Encounter (Signed)
Called patient and she stated that sh has been very SOB lately. She stated that just getting up to answer the phone just now, "felt like a lot of work".  Patient stated that her HR has been running between 42-45 as she has been checking it over the last few days.  I offered patient an appointment for tomorrow morning and advised that she should be seen. Patient was agreeable and very grateful for the call back.

## 2021-05-02 NOTE — Telephone Encounter (Signed)
Patient requests a call back after 1 pm

## 2021-05-03 ENCOUNTER — Ambulatory Visit: Payer: Medicare Other | Admitting: Cardiology

## 2021-05-03 ENCOUNTER — Emergency Department (HOSPITAL_COMMUNITY): Payer: Medicare Other

## 2021-05-03 ENCOUNTER — Encounter: Payer: Self-pay | Admitting: Cardiology

## 2021-05-03 ENCOUNTER — Ambulatory Visit (HOSPITAL_COMMUNITY)
Admission: EM | Admit: 2021-05-03 | Discharge: 2021-05-04 | Disposition: A | Discharge status: Home / Self Care | Payer: Medicare Other | Attending: Internal Medicine | Admitting: Internal Medicine

## 2021-05-03 ENCOUNTER — Other Ambulatory Visit: Payer: Self-pay

## 2021-05-03 ENCOUNTER — Encounter (HOSPITAL_COMMUNITY): Payer: Self-pay | Admitting: Internal Medicine

## 2021-05-03 ENCOUNTER — Ambulatory Visit (HOSPITAL_COMMUNITY)
Admission: EM | Disposition: A | Discharge status: Home / Self Care | Payer: Self-pay | Source: Home / Self Care | Attending: Emergency Medicine

## 2021-05-03 VITALS — BP 140/60 | HR 42 | Ht 59.0 in | Wt 157.0 lb

## 2021-05-03 DIAGNOSIS — I779 Disorder of arteries and arterioles, unspecified: Secondary | ICD-10-CM | POA: Diagnosis present

## 2021-05-03 DIAGNOSIS — Z7901 Long term (current) use of anticoagulants: Secondary | ICD-10-CM | POA: Diagnosis not present

## 2021-05-03 DIAGNOSIS — R5383 Other fatigue: Secondary | ICD-10-CM | POA: Diagnosis not present

## 2021-05-03 DIAGNOSIS — E782 Mixed hyperlipidemia: Secondary | ICD-10-CM | POA: Diagnosis present

## 2021-05-03 DIAGNOSIS — Z20822 Contact with and (suspected) exposure to covid-19: Secondary | ICD-10-CM | POA: Diagnosis not present

## 2021-05-03 DIAGNOSIS — I441 Atrioventricular block, second degree: Secondary | ICD-10-CM | POA: Diagnosis present

## 2021-05-03 DIAGNOSIS — I4510 Unspecified right bundle-branch block: Secondary | ICD-10-CM | POA: Insufficient documentation

## 2021-05-03 DIAGNOSIS — Z8673 Personal history of transient ischemic attack (TIA), and cerebral infarction without residual deficits: Secondary | ICD-10-CM | POA: Insufficient documentation

## 2021-05-03 DIAGNOSIS — I10 Essential (primary) hypertension: Secondary | ICD-10-CM | POA: Diagnosis present

## 2021-05-03 DIAGNOSIS — E7800 Pure hypercholesterolemia, unspecified: Secondary | ICD-10-CM

## 2021-05-03 DIAGNOSIS — Z950 Presence of cardiac pacemaker: Secondary | ICD-10-CM

## 2021-05-03 DIAGNOSIS — Z86718 Personal history of other venous thrombosis and embolism: Secondary | ICD-10-CM | POA: Diagnosis not present

## 2021-05-03 DIAGNOSIS — R001 Bradycardia, unspecified: Secondary | ICD-10-CM | POA: Diagnosis not present

## 2021-05-03 DIAGNOSIS — I442 Atrioventricular block, complete: Secondary | ICD-10-CM | POA: Diagnosis not present

## 2021-05-03 DIAGNOSIS — R0602 Shortness of breath: Secondary | ICD-10-CM | POA: Diagnosis not present

## 2021-05-03 DIAGNOSIS — D6851 Activated protein C resistance: Secondary | ICD-10-CM | POA: Insufficient documentation

## 2021-05-03 DIAGNOSIS — I517 Cardiomegaly: Secondary | ICD-10-CM | POA: Diagnosis not present

## 2021-05-03 HISTORY — PX: PACEMAKER IMPLANT: EP1218

## 2021-05-03 LAB — CBC WITH DIFFERENTIAL/PLATELET
Abs Immature Granulocytes: 0.03 10*3/uL (ref 0.00–0.07)
Basophils Absolute: 0 10*3/uL (ref 0.0–0.1)
Basophils Relative: 0 %
Eosinophils Absolute: 0 10*3/uL (ref 0.0–0.5)
Eosinophils Relative: 0 %
HCT: 40.1 % (ref 36.0–46.0)
Hemoglobin: 13.5 g/dL (ref 12.0–15.0)
Immature Granulocytes: 1 %
Lymphocytes Relative: 22 %
Lymphs Abs: 1.4 10*3/uL (ref 0.7–4.0)
MCH: 31 pg (ref 26.0–34.0)
MCHC: 33.7 g/dL (ref 30.0–36.0)
MCV: 92 fL (ref 80.0–100.0)
Monocytes Absolute: 0.5 10*3/uL (ref 0.1–1.0)
Monocytes Relative: 8 %
Neutro Abs: 4.4 10*3/uL (ref 1.7–7.7)
Neutrophils Relative %: 69 %
Platelets: 189 10*3/uL (ref 150–400)
RBC: 4.36 MIL/uL (ref 3.87–5.11)
RDW: 12.9 % (ref 11.5–15.5)
WBC: 6.4 10*3/uL (ref 4.0–10.5)
nRBC: 0 % (ref 0.0–0.2)

## 2021-05-03 LAB — COMPREHENSIVE METABOLIC PANEL
ALT: 13 U/L (ref 0–44)
AST: 20 U/L (ref 15–41)
Albumin: 4.4 g/dL (ref 3.5–5.0)
Alkaline Phosphatase: 86 U/L (ref 38–126)
Anion gap: 11 (ref 5–15)
BUN: 14 mg/dL (ref 8–23)
CO2: 24 mmol/L (ref 22–32)
Calcium: 10.2 mg/dL (ref 8.9–10.3)
Chloride: 104 mmol/L (ref 98–111)
Creatinine, Ser: 1.6 mg/dL — ABNORMAL HIGH (ref 0.44–1.00)
GFR, Estimated: 33 mL/min — ABNORMAL LOW (ref >60)
Glucose, Bld: 108 mg/dL — ABNORMAL HIGH (ref 70–99)
Potassium: 4.1 mmol/L (ref 3.5–5.1)
Sodium: 139 mmol/L (ref 135–145)
Total Bilirubin: 0.9 mg/dL (ref 0.3–1.2)
Total Protein: 7.6 g/dL (ref 6.5–8.1)

## 2021-05-03 LAB — RESP PANEL BY RT-PCR (FLU A&B, COVID) ARPGX2
Influenza A by PCR: NEGATIVE
Influenza B by PCR: NEGATIVE
SARS Coronavirus 2 by RT PCR: NEGATIVE

## 2021-05-03 LAB — PROTIME-INR
INR: 2.5 — ABNORMAL HIGH (ref 0.8–1.2)
INR: 2.6 — ABNORMAL HIGH (ref 0.8–1.2)
Prothrombin Time: 26.9 seconds — ABNORMAL HIGH (ref 11.4–15.2)
Prothrombin Time: 28 seconds — ABNORMAL HIGH (ref 11.4–15.2)

## 2021-05-03 LAB — TROPONIN I (HIGH SENSITIVITY)
Troponin I (High Sensitivity): 11 ng/L (ref <18)
Troponin I (High Sensitivity): 947 ng/L (ref <18)

## 2021-05-03 LAB — MAGNESIUM: Magnesium: 1.8 mg/dL (ref 1.7–2.4)

## 2021-05-03 SURGERY — PACEMAKER IMPLANT
Anesthesia: LOCAL

## 2021-05-03 MED ORDER — FENTANYL CITRATE (PF) 100 MCG/2ML IJ SOLN
INTRAMUSCULAR | Status: DC | PRN
  Administered 2021-05-03: 25 ug via INTRAVENOUS

## 2021-05-03 MED ORDER — VITAMIN B-12 1000 MCG PO TABS
1000.0000 ug | ORAL_TABLET | Freq: Every day | ORAL | Status: DC
  Administered 2021-05-03 – 2021-05-04 (×2): 1000 ug via ORAL
  Filled 2021-05-03 (×2): qty 1

## 2021-05-03 MED ORDER — CEFAZOLIN SODIUM-DEXTROSE 2-4 GM/100ML-% IV SOLN
INTRAVENOUS | Status: AC
  Filled 2021-05-03: qty 100

## 2021-05-03 MED ORDER — MIDAZOLAM HCL 5 MG/5ML IJ SOLN
INTRAMUSCULAR | Status: AC
  Filled 2021-05-03: qty 5

## 2021-05-03 MED ORDER — ONDANSETRON HCL 4 MG/2ML IJ SOLN: 4.0000 mg | Freq: Four times a day (QID) | INTRAMUSCULAR | Status: DC | PRN

## 2021-05-03 MED ORDER — FENTANYL CITRATE (PF) 100 MCG/2ML IJ SOLN
INTRAMUSCULAR | Status: AC
  Filled 2021-05-03: qty 2

## 2021-05-03 MED ORDER — SODIUM CHLORIDE 0.9 % IV SOLN
INTRAVENOUS | Status: AC
  Filled 2021-05-03: qty 2

## 2021-05-03 MED ORDER — LIDOCAINE HCL (PF) 1 % IJ SOLN
INTRAMUSCULAR | Status: DC | PRN
  Administered 2021-05-03: 60 mL

## 2021-05-03 MED ORDER — MIDAZOLAM HCL 5 MG/5ML IJ SOLN
INTRAMUSCULAR | Status: DC | PRN
  Administered 2021-05-03: 2 mg via INTRAVENOUS

## 2021-05-03 MED ORDER — ACETAMINOPHEN 325 MG PO TABS
650.0000 mg | ORAL_TABLET | Freq: Once | ORAL | Status: AC
  Administered 2021-05-03: 650 mg via ORAL
  Filled 2021-05-03: qty 2

## 2021-05-03 MED ORDER — AMLODIPINE BESYLATE 10 MG PO TABS
10.0000 mg | ORAL_TABLET | Freq: Every day | ORAL | Status: DC
  Administered 2021-05-04: 10 mg via ORAL
  Filled 2021-05-03: qty 1

## 2021-05-03 MED ORDER — ACETAMINOPHEN 325 MG PO TABS
325.0000 mg | ORAL_TABLET | ORAL | Status: DC | PRN
  Administered 2021-05-03 – 2021-05-04 (×2): 650 mg via ORAL
  Filled 2021-05-03 (×2): qty 2

## 2021-05-03 MED ORDER — SODIUM CHLORIDE 0.9 % IV SOLN: INTRAVENOUS | Status: DC

## 2021-05-03 MED ORDER — CHLORHEXIDINE GLUCONATE 4 % EX LIQD: 60.0000 mL | Freq: Once | CUTANEOUS | Status: AC

## 2021-05-03 MED ORDER — LIDOCAINE HCL (PF) 1 % IJ SOLN
INTRAMUSCULAR | Status: AC
  Filled 2021-05-03: qty 60

## 2021-05-03 MED ORDER — BENAZEPRIL HCL 40 MG PO TABS
40.0000 mg | ORAL_TABLET | Freq: Every day | ORAL | Status: DC
  Administered 2021-05-04: 40 mg via ORAL
  Filled 2021-05-03: qty 1

## 2021-05-03 MED ORDER — CEFAZOLIN SODIUM-DEXTROSE 2-4 GM/100ML-% IV SOLN
2.0000 g | INTRAVENOUS | Status: AC
  Administered 2021-05-03: 2 g via INTRAVENOUS

## 2021-05-03 MED ORDER — HEPARIN (PORCINE) IN NACL 1000-0.9 UT/500ML-% IV SOLN
INTRAVENOUS | Status: AC
  Filled 2021-05-03: qty 500

## 2021-05-03 MED ORDER — ACETAMINOPHEN 500 MG PO TABS
1000.0000 mg | ORAL_TABLET | Freq: Once | ORAL | Status: AC
  Administered 2021-05-03: 1000 mg via ORAL
  Filled 2021-05-03: qty 2

## 2021-05-03 MED ORDER — CEFAZOLIN SODIUM-DEXTROSE 1-4 GM/50ML-% IV SOLN
1.0000 g | Freq: Four times a day (QID) | INTRAVENOUS | Status: AC
  Administered 2021-05-03 – 2021-05-04 (×3): 1 g via INTRAVENOUS
  Filled 2021-05-03 (×3): qty 50

## 2021-05-03 MED ORDER — SODIUM CHLORIDE 0.9% FLUSH
3.0000 mL | Freq: Two times a day (BID) | INTRAVENOUS | Status: DC
  Administered 2021-05-03 (×2): 3 mL via INTRAVENOUS

## 2021-05-03 MED ORDER — VITAMIN D 25 MCG (1000 UNIT) PO TABS
2000.0000 [IU] | ORAL_TABLET | Freq: Every day | ORAL | Status: DC
  Administered 2021-05-03 – 2021-05-04 (×2): 2000 [IU] via ORAL
  Filled 2021-05-03 (×2): qty 2

## 2021-05-03 MED ORDER — SODIUM CHLORIDE 0.9% FLUSH: 3.0000 mL | INTRAVENOUS | Status: DC | PRN

## 2021-05-03 MED ORDER — SODIUM CHLORIDE 0.9 % IV SOLN: 250.0000 mL | INTRAVENOUS | Status: DC

## 2021-05-03 MED ORDER — SODIUM CHLORIDE 0.9 % IV SOLN
80.0000 mg | INTRAVENOUS | Status: AC
  Administered 2021-05-03: 80 mg

## 2021-05-03 MED ORDER — SPIRONOLACTONE 25 MG PO TABS
25.0000 mg | ORAL_TABLET | Freq: Every day | ORAL | Status: DC
  Administered 2021-05-04: 25 mg via ORAL
  Filled 2021-05-03: qty 1

## 2021-05-03 SURGICAL SUPPLY — 12 items
CABLE SURGICAL S-101-97-12 (CABLE) ×2 IMPLANT
KIT ACCESSORY SELECTRA FIX CVD (MISCELLANEOUS) ×1 IMPLANT
KIT MICROPUNCTURE NIT STIFF (SHEATH) ×1 IMPLANT
LEAD SELECTRA 3D-55-42 (CATHETERS) ×1 IMPLANT
LEAD SOLIA S PRO MRI 53 (Lead) ×1 IMPLANT
LEAD SOLIA S PRO MRI 60 (Lead) ×1 IMPLANT
PACEMAKER EDORA 8DR-T MRI (Pacemaker) ×1 IMPLANT
PAD DEFIB RADIO PHYSIO CONN (PAD) ×2 IMPLANT
SHEATH 7FR PRELUDE SNAP 13 (SHEATH) ×1 IMPLANT
SHEATH 9FR PRELUDE SNAP 13 (SHEATH) ×1 IMPLANT
TRAY PACEMAKER INSERTION (PACKS) ×2 IMPLANT
WIRE HI TORQ VERSACORE-J 145CM (WIRE) ×1 IMPLANT

## 2021-05-03 NOTE — Patient Instructions (Signed)
Medication Instructions:   Your physician recommends that you continue on your current medications as directed. Please refer to the Current Medication list given to you today.   *If you need a refill on your cardiac medications before your next appointment, please call your pharmacy*   Lab Work: None ordered If you have labs (blood work) drawn today and your tests are completely normal, you will receive your results only by: [facility] (if you have MyChart) OR A paper copy in the mail If you have any lab test that is abnormal or we need to change your treatment, we will call you to review the results.   Testing/Procedures: None ordered   Follow-Up: At [facility], you and your health needs are our priority.  As part of our continuing mission to provide you with exceptional heart care, we have created designated Provider Care Teams.  These Care Teams include your primary Cardiologist (physician) and Advanced Practice Providers (APPs -  Physician Assistants and Nurse Practitioners) who all work together to provide you with the care you need, when you need it.  We recommend signing up for the patient portal called "MyChart".  Sign up information is provided on this After Visit Summary.  MyChart is used to connect with patients for Virtual Visits (Telemedicine).  Patients are able to view lab/test results, encounter notes, upcoming appointments, etc.  Non-urgent messages can be sent to your provider as well.   To learn more about what you can do with MyChart, go to [email or web address].    Your next appointment:   3 month(s)  The format for your next appointment:   In Person  Provider:   You may see Kate Sable, MD or one of the following Advanced Practice Providers on your designated Care Team:   Murray Hodgkins, NP Christell Faith, PA-C Cadence Kathlen Mody, [place]    Other Instructions  Pl

## 2021-05-03 NOTE — ED Provider Notes (Signed)
[facility] EMERGENCY DEPARTMENT Provider Note   CSN: [id number] Arrival date & time: 05/03/21  1228     History  Chief Complaint  Patient presents with   Bradycardia    Bonnie Zhang is a 81 y.o. female.  81 yo F sent from cardiology clinic with likely Mobitz type II AV block.  Patient with a couple weeks of feeling a bit fatigued had similar symptoms about a year ago and was seen by the cardiologist in the office.  Thought to be due to her medications and was taken off the medications for her blood pressure and had improvement.  Today her EKG was abnormal this was discussed with the cardiologist here and she was sent here for evaluation.       Home Medications Prior to Admission medications   Medication Sig Start Date End Date Taking? Authorizing Provider  acetaminophen (TYLENOL) 500 MG tablet Take 500-1,000 mg by mouth every 6 (six) hours as needed for mild pain.   Yes [provider]  amLODipine (NORVASC) 10 MG tablet Take 1 tablet (10 mg total) by mouth daily. 03/05/21  Yes Burns, Claudina Lick, MD  benazepril (LOTENSIN) 40 MG tablet TAKE 1 TABLET BY MOUTH DAILY. Patient taking differently: Take 40 mg by mouth daily. 03/26/21  Yes Burns, Claudina Lick, MD  cholecalciferol (VITAMIN D3) 25 MCG (1000 UNIT) tablet Take 2,000 Units by mouth daily.   Yes [provider]  pantoprazole (PROTONIX) 40 MG tablet TAKE 1 TABLET BY MOUTH ONCE DAILY AS NEEDED FOR ACID REFLUX Patient taking differently: 40 mg daily as needed (acid reflux). 10/15/20  Yes Burns, Claudina Lick, MD  simvastatin (ZOCOR) 10 MG tablet TAKE 1 TABLET BY MOUTH AT BEDTIME. Patient taking differently: Take 10 mg by mouth at bedtime. 10/15/20  Yes Burns, Claudina Lick, MD  spironolactone (ALDACTONE) 25 MG tablet Take 1 tablet (25 mg total) by mouth daily. 11/05/20  Yes Agbor-Etang, Aaron Edelman, MD  vitamin B-12 (CYANOCOBALAMIN) 1000 MCG tablet Take 1,000 mcg by mouth daily.   Yes [provider]  warfarin  (COUMADIN) 5 MG tablet Take 1/2 tablet daily except take 1 tablet on Mon and Thurs or take as directed by anticoagulation clinic Patient taking differently: Take 2.5-5 mg by mouth See admin instructions. Take 1/2 tablet = 2.5 mg daily except take 1 tablet  = 5 mg on Mon and Thurs or take as directed by anticoagulation clinic 10/15/20  Yes Burns, Claudina Lick, MD      Allergies    Vicodin [hydrocodone-acetaminophen], Aspirin, Hctz [hydrochlorothiazide], and Lipitor [atorvastatin]    Review of Systems   Review of Systems  Physical Exam Updated Vital Signs BP (!) 138/40    Pulse (!) 58    Temp 98.3 F (36.8 C) (Oral)    Resp 19    SpO2 98%  Physical Exam Vitals and nursing note reviewed.  Constitutional:      General: She is not in acute distress.    Appearance: She is well-developed. She is not diaphoretic.  HENT:     Head: Normocephalic and atraumatic.  Eyes:     Pupils: Pupils are equal, round, and reactive to light.  Cardiovascular:     Rate and Rhythm: Regular rhythm. Bradycardia present.     Heart sounds: No murmur heard.   No friction rub. No gallop.  Pulmonary:     Effort: Pulmonary effort is normal.     Breath sounds: No wheezing or rales.  Abdominal:     General:  There is no distension.     Palpations: Abdomen is soft.     Tenderness: There is no abdominal tenderness.  Musculoskeletal:        General: No tenderness.     Cervical back: Normal range of motion and neck supple.  Skin:    General: Skin is warm and dry.  Neurological:     Mental Status: She is alert and oriented to person, place, and time.  Psychiatric:        Behavior: Behavior normal.    ED Results / Procedures / Treatments   Labs (all labs ordered are listed, but only abnormal results are displayed) Labs Reviewed  COMPREHENSIVE METABOLIC PANEL - Abnormal; Notable for the following components:      Result Value   Glucose, Bld 108 (*)    Creatinine, Ser 1.60 (*)    GFR, Estimated 33 (*)    All other  components within normal limits  PROTIME-INR - Abnormal; Notable for the following components:   Prothrombin Time 26.9 (*)    INR 2.5 (*)    All other components within normal limits  RESP PANEL BY RT-PCR (FLU A&B, COVID) ARPGX2  CBC WITH DIFFERENTIAL/PLATELET  MAGNESIUM  PROTIME-INR  TROPONIN I (HIGH SENSITIVITY)    EKG EKG Interpretation  Date/Time:  Friday May 03 2021 12:33:54 EST Ventricular Rate:  42 PR Interval:  152 QRS Duration: 122 QT Interval:  492 QTC Calculation: 410 R Axis:   -28 Text Interpretation: high grade av block Right bundle branch block Abnormal ECG When compared with ECG of 18-Mar-2019 18:49, PREVIOUS ECG IS PRESENT Otherwise no significant change Confirmed by Deno Etienne [phone]) on 05/03/2021 1:02:07 PM  Radiology DG Chest Portable 1 View  Result Date: 05/03/2021 CLINICAL DATA:  Shortness of breath, fatigue EXAM: PORTABLE CHEST 1 VIEW COMPARISON:  08/31/2014 FINDINGS: Cardiomegaly. Both lungs are clear. The visualized skeletal structures are unremarkable. IMPRESSION: Cardiomegaly without acute abnormality of the lungs in AP portable projection. Electronically Signed   By: Delanna Ahmadi M.D.   On: 05/03/2021 13:31    Procedures Procedures    Medications Ordered in ED Medications  0.9 %  sodium chloride infusion (has no administration in time range)  sodium chloride flush (NS) 0.9 % injection 3 mL (has no administration in time range)  acetaminophen (TYLENOL) tablet 1,000 mg (1,000 mg Oral Given 05/03/21 1342)    ED Course/ Medical Decision Making/ A&P                           Medical Decision Making Amount and/or Complexity of Data Reviewed Labs: ordered.  Risk OTC drugs. Decision regarding hospitalization.   Patient is a 81 y.o. female with a cc of fatigue going on for couple weeks now.  Was seen by her cardiologist today and found to be in Mobitz type II.  Was sent here for EP evaluation.  We will obtain a laboratory evaluation to assess  for acute anemia or electrolyte abnormality.  I discussed the case with cardiology will come and see at bedside.  I reviewed the note from the cardiology office in [place] earlier.  I reviewed the EKG that came with her which was consistent with high degree AV block.  I independently interpreted the patients labs and imaging EKG here with likely Mobitz type II versus complete AV block.  CBC without anemia.  CRITICAL CARE Performed by: Cecilio Asper   Total critical care time: 35 minutes  Critical care  time was exclusive of separately billable procedures and treating other patients.  Critical care was necessary to treat or prevent imminent or life-threatening deterioration.  Critical care was time spent personally by me on the following activities: development of treatment plan with patient and/or surrogate as well as nursing, discussions with consultants, evaluation of patient's response to treatment, examination of patient, obtaining history from patient or surrogate, ordering and performing treatments and interventions, ordering and review of laboratory studies, ordering and review of radiographic studies, pulse oximetry and re-evaluation of patient's condition.  The patients results and plan were reviewed and discussed.   Any x-rays performed were independently reviewed by myself.   Differential diagnosis were considered with the presenting HPI.  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          Final Clinical Impression(s) / ED Diagnoses Final diagnoses:  Mobitz type II atrioventricular block    Rx / DC Orders ED Discharge Orders     None         Deno Etienne, DO 05/03/21 1416

## 2021-05-03 NOTE — Progress Notes (Signed)
Date and time results received: 05/03/21 2015 (use smartphrase ".now" to insert current time)  Test: Troponin Critical Value: 947  Name of Provider Notified: Rudi Rummage  Orders Received? Or Actions Taken?:  MD informed, awaiting orders

## 2021-05-03 NOTE — Progress Notes (Signed)
Cardiology Office Note:    Date:  05/03/2021   ID:  Bonnie Zhang, DOB Jul 07, 1940, MRN [id number]  PCP:  [name], MD   [facility] Cardiologist:  Kate Sable, MD     Referring MD: [name], MD   Chief Complaint  Patient presents with   OTher    Patient c.o SOB and low HR. Patient states this AM his HR was in the 30s. Meds reviewed verbally with patient.     History of Present Illness:    Bonnie Zhang is a 81 y.o. 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  Patient states having worsening shortness of breath, fatigue over the past several weeks.  She has noticed a reduction in her heart rate as low as 39 occasionally.  Averaging 42 at home.  She had low heart rates 6 months ago, was on metoprolol which was stopped in June/2022.  Follow-up in October with improvement in heart rates to the 60s.  She is currently on no beta-blocker, or AV nodal agents.  Last use of beta-blocker was over 6 months ago.  Prior notes Echo 09/2020 EF 60 to 02%, grade 2 diastolic dysfunction. Lexiscan 09/2020, no evidence for ischemia. Echo 4097 normal systolic function, impaired relaxation, EF 55 to 60%   Past Medical History:  Diagnosis Date   Anticoagulated on Coumadin 2001   2 blood clots   Bruises easily    pt is on Coumadin;last dose of Coumadin 08/05/11 and then Lovenox started 08/07/11   DVT (deep vein thrombosis) in pregnancy     1965 post C section;2001 with prolonged driving while on  HRT   DVT (deep venous thrombosis) ([place]) 2001   Was on Prempro.     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 Gery Pray   Homocysteinemia 01/23/2015   Hx of migraines    yrs ago    Hypertension    takes Benazepril,Amlodipine,and Metoprolol daily   Osteoporosis    PONV (postoperative nausea and vomiting)    Primary localized osteoarthritis of left knee    PVD (peripheral vascular disease) ([place]) 1965, 2001   abnormal venous doppler findings due to recurrent DVT'S left leg   Right knee DJD    knees   Shortness of breath dyspnea    TIA (transient ischemic attack) 2020   Vitamin B12 deficiency (non anemic) 01/24/2015   Vitamin D deficiency    takes VIt D daily    Past Surgical History:  Procedure Laterality Date   ABDOMINAL HYSTERECTOMY     APPENDECTOMY  04/01/1963   BREAST LUMPECTOMY  03/31/1985   CESAREAN SECTION  1961/1965/1966   X 3   CHOLECYSTECTOMY  04/01/1995   COLONOSCOPY W/ POLYPECTOMY  03/31/2005   Dr  Sharlett Iles; due? 2012   [place]  03/31/2010   uterine polyp, Dr Kennon Rounds   ESOPHAGOGASTRODUODENOSCOPY  04/01/1995   HYSTEROSCOPY WITH D & C N/A 08/05/2012   Procedure: DILATATION AND CURETTAGE /HYSTEROSCOPY;  Surgeon: Donnamae Jude, MD;  Location: [facility];  Service: Gynecology;  Laterality: N/A;   ROBOTIC ASSISTED  TOTAL HYSTERECTOMY WITH BILATERAL SALPINGO OOPHERECTOMY Right 09/14/2012   Procedure: ROBOTIC ASSISTED TOTAL HYSTERECTOMY WITH BILATERAL SALPINGO OOPHORECTOMY ,right pelvic LYMPHNODE dissection;  Surgeon: Imagene Gurney A. Alycia Rossetti, MD;  Location: [facility];  Service: Gynecology;  Laterality: Right;   TOTAL KNEE ARTHROPLASTY  08/11/2011   Procedure: TOTAL KNEE ARTHROPLASTY;  Surgeon: Lorn Junes, MD;  Location: [facility];  Service: Orthopedics;  Laterality: Right;  DR Ephriam Knuckles 90 MINUTES FOR THIS CASE   TOTAL KNEE ARTHROPLASTY Left 09/11/2014   Procedure: TOTAL KNEE ARTHROPLASTY;  Surgeon: Elsie Saas, MD;  Location: [facility];  Service: Orthopedics;  Laterality: Left;   WISDOM TOOTH EXTRACTION      Current Medications: Current Meds   Medication Sig   acetaminophen (TYLENOL) 500 MG tablet Take 500 mg by mouth every 6 (six) hours as needed.   amLODipine (NORVASC) 10 MG tablet Take 1 tablet (10 mg total) by mouth daily.   benazepril (LOTENSIN) 40 MG tablet TAKE 1 TABLET BY MOUTH DAILY.   cholecalciferol (VITAMIN D3) 25 MCG (1000 UNIT) tablet Take 2,000 Units by mouth daily.   pantoprazole (PROTONIX) 40 MG tablet TAKE 1 TABLET BY MOUTH ONCE DAILY AS NEEDED FOR ACID REFLUX   simvastatin (ZOCOR) 10 MG tablet TAKE 1 TABLET BY MOUTH AT BEDTIME.   spironolactone (ALDACTONE) 25 MG tablet Take 1 tablet (25 mg total) by mouth daily.   vitamin B-12 (CYANOCOBALAMIN) 1000 MCG tablet Take 1,000 mcg by mouth daily.   warfarin (COUMADIN) 5 MG tablet Take 1/2 tablet daily except take 1 tablet on Mon and Thurs or take as directed by anticoagulation clinic     Allergies:   Vicodin [hydrocodone-acetaminophen], Aspirin, Hctz [hydrochlorothiazide], and Lipitor [atorvastatin]   Social History   Socioeconomic History   Marital status: Divorced    Spouse name: Not on file   Number of children: 2   Years of education: Not on file   Highest education level: High school graduate  Occupational History   Occupation: Retired  Tobacco Use   Smoking status: Never   Smokeless tobacco: Never  Vaping Use   Vaping Use: Never used  Substance and Sexual Activity   Alcohol use: No   Drug use: No   Sexual activity: Not Currently    Birth control/protection: Post-menopausal    Comment: retired. has 2 daughters  Other Topics Concern   Not on file  Social History Narrative   Pt lives alone she has 2 daughter - right handed- drinks tea, soda sometimes - No regular exercise   Social Determinants of Health   Financial Resource Strain: Low Risk    Difficulty of Paying Living Expenses: Not hard at all  Food Insecurity: No Food Insecurity   Worried About Charity fundraiser in the Last Year: Never true   Ran Out of Food in the Last Year: Never true   Transportation Needs: No Transportation Needs   Lack of Transportation (Medical): No   Lack of Transportation (Non-Medical): No  Physical Activity: Sufficiently Active   Days of Exercise per Week: 5 days   Minutes of Exercise per Session: 30 min  Stress: No Stress Concern Present   Feeling of Stress : Not at all  Social Connections: Moderately Integrated   Frequency of Communication with Friends and Family: More than three times a week   Frequency of Social Gatherings with Friends and Family: More than three times a week   Attends Religious Services: More than 4 times per year   Active Member of Clubs or  Organizations: Yes   Attends Music therapist: More than 4 times per year   Marital Status: Widowed     Family History: The patient's family history includes Colon cancer in her maternal grandmother; Deep vein thrombosis in her mother; Diabetes in an other family member; Heart failure in her maternal grandfather; Hypertension in her mother and sister; Kidney disease in her mother; Leukemia in her father; Liver disease in her brother. There is no history of Anesthesia problems, Hypotension, Malignant hyperthermia, Pseudochol deficiency, Stroke, Heart disease, Breast cancer, Ovarian cancer, Uterine cancer, Pancreatic cancer, or Cancer - Prostate.  ROS:   Please see the history of present illness.     All other systems reviewed and are negative.  EKGs/Labs/Other Studies Reviewed:    The following studies were reviewed today:   EKG:  EKG is ordered today.  EKG shows sinus rhythm, Mobitz 2 AV block   Recent Labs: 09/26/2020: ALT 8 03/05/2021: BUN 16; Creatinine, Ser 1.10; Hemoglobin 13.4; Platelets 216.0; Potassium 3.9; Sodium 141  Recent Lipid Panel    Component Value Date/Time   CHOL 151 03/05/2021 1048   CHOL 178 05/25/2014 1121   TRIG 122.0 03/05/2021 1048   TRIG 138 05/25/2014 1121   HDL 71.20 03/05/2021 1048   HDL 76 05/25/2014 1121   CHOLHDL 2 03/05/2021  1048   VLDL 24.4 03/05/2021 1048   LDLCALC 55 03/05/2021 1048   LDLCALC 74 05/25/2014 1121     Risk Assessment/Calculations:          Physical Exam:    VS:  BP 140/60 (BP Location: Left Arm, Patient Position: Sitting, Cuff Size: Normal)    Pulse (!) 42    Ht 4\' 11"  (1.499 m)    Wt 157 lb (71.2 kg)    SpO2 96%    BMI 31.71 kg/m     Wt Readings from Last 3 Encounters:  05/03/21 157 lb (71.2 kg)  05/01/21 159 lb (72.1 kg)  03/14/21 161 lb 12.8 oz (73.4 kg)     GEN:  Well nourished, well developed in no acute distress HEENT: Normal NECK: No JVD; No carotid bruits LYMPHATICS: No lymphadenopathy CARDIAC: RRR, no murmurs, rubs, gallops RESPIRATORY:  Clear to auscultation without rales, wheezing or rhonchi  ABDOMEN: Soft, non-tender, non-distended MUSCULOSKELETAL:  No edema; No deformity  SKIN: Warm and dry NEUROLOGIC:  Alert and oriented x 3 PSYCHIATRIC:  Normal affect   ASSESSMENT:    1. Mobitz II   2. Primary hypertension   3. Pure hypercholesterolemia     PLAN:    In order of problems listed above:  Mobitz 2 AV block, symptoms include fatigue, shortness of breath.  Recommend transferring patient to ED, follow permanent pacemaker consideration.  EP team made aware of patient, will evaluate.  Likely need to hold Coumadin.  Discussed with Dr. Quentin Ore, avoid bridging with Lovenox for now. Hypertension, BP controlled.  Continue Aldactone to 25 mg daily, Norvasc, benazepril Hyperlipidemia, cholesterol controlled.  Continue simvastatin.  Follow-up in 3 months.  Over 1 hour spent discussing symptoms with patient, clinical findings, coordination of care involved with transferring, calling family, possible direct admission.  Medication Adjustments/Labs and Tests Ordered: Current medicines are reviewed at length with the patient today.  Concerns regarding medicines are outlined above.  Orders Placed This Encounter  Procedures   EKG 12-Lead    No orders of the defined  types were placed in this encounter.     Patient Instructions  Medication Instructions:   Your physician recommends that  you continue on your current medications as directed. Please refer to the Current Medication list given to you today.   *If you need a refill on your cardiac medications before your next appointment, please call your pharmacy*   Lab Work: None ordered If you have labs (blood work) drawn today and your tests are completely normal, you will receive your results only by: [place] (if you have MyChart) OR A paper copy in the mail If you have any lab test that is abnormal or we need to change your treatment, we will call you to review the results.   Testing/Procedures: None ordered   Follow-Up: At [facility], you and your health needs are our priority.  As part of our continuing mission to provide you with exceptional heart care, we have created designated Provider Care Teams.  These Care Teams include your primary Cardiologist (physician) and Advanced Practice Providers (APPs -  Physician Assistants and Nurse Practitioners) who all work together to provide you with the care you need, when you need it.  We recommend signing up for the patient portal called "MyChart".  Sign up information is provided on this After Visit Summary.  MyChart is used to connect with patients for Virtual Visits (Telemedicine).  Patients are able to view lab/test results, encounter notes, upcoming appointments, etc.  Non-urgent messages can be sent to your provider as well.   To learn more about what you can do with MyChart, go to [email or web address].    Your next appointment:   3 month(s)  The format for your next appointment:   In Person  Provider:   You may see Kate Sable, MD or one of the following Advanced Practice Providers on your designated Care Team:   Murray Hodgkins, NP Christell Faith, PA-C Cadence Kathlen Mody, [place]    Other Instructions  Pl    Signed, Kate Sable, MD  05/03/2021 12:45 PM    [place]

## 2021-05-03 NOTE — Discharge Instructions (Signed)
° ° °  Supplemental Discharge Instructions for  Pacemaker/Defibrillator Patients   Activity No heavy lifting or vigorous activity with your left/right arm for 6 to 8 weeks.  Do not raise your left/right arm above your head for one week.  Gradually raise your affected arm as drawn below.              05/08/21                     05/09/21                     05/10/21                  05/11/21 __  NO DRIVING for  1 week   ; you may begin driving on  3/56/86   .  WOUND CARE Keep the wound area clean and dry.  Do not get this area wet , no showers for one week; you may shower on  05/11/21   . The tape/steri-strips on your wound will fall off; do not pull them off.  No bandage is needed on the site.  DO  NOT apply any creams, oils, or ointments to the wound area. If you notice any drainage or discharge from the wound, any swelling or bruising at the site, or you develop a fever > 101? F after you are discharged home, call the office at once.  Special Instructions You are still able to use cellular telephones; use the ear opposite the side where you have your pacemaker/defibrillator.  Avoid carrying your cellular phone near your device. When traveling through airports, show security personnel your identification card to avoid being screened in the metal detectors.  Ask the security personnel to use the hand wand. Avoid arc welding equipment, MRI testing (magnetic resonance imaging), TENS units (transcutaneous nerve stimulators).  Call the office for questions about other devices. Avoid electrical appliances that are in poor condition or are not properly grounded. Microwave ovens are safe to be near or to operate.

## 2021-05-03 NOTE — ED Provider Triage Note (Signed)
Emergency Medicine Provider Triage Evaluation Note  Bonnie Zhang , Zhang 81 y.o. female  was evaluated in triage.  Pt complains of bradycardia for several weeks.  She was evaluated by her cardiologist today and told to come into the ED for further evaluation.  She also notes that her cardiologist informed her that she will likely be admitted to the hospital and has Zhang pacemaker placed on Monday.  She was taken off her beta-blocker and June 2022.  Has associated shortness of breath with exertion.  Has not tried medications for her symptoms.  Denies chest pain, abdominal pain, nausea, vomiting, back pain.  Review of Systems  Positive: As per HPI above. Negative: Chest pain, abdominal pain  Physical Exam  BP (!) 158/50 (BP Location: Right Arm)    Pulse (!) 41    Temp 98.3 F (36.8 C) (Oral)    Resp 16    SpO2 100%  Gen:   Awake, no distress   Resp:  Normal effort  MSK:   Moves extremities without difficulty  Other:  No chest wall tenderness to palpation.  Medical Decision Making  Medically screening exam initiated at 12:44 PM.  Appropriate orders placed.  Bonnie Zhang was informed that the remainder of the evaluation will be completed by another provider, this initial triage assessment does not replace that evaluation, and the importance of remaining in the ED until their evaluation is complete.  12:47 PM - Discussed with RN that patient is in need of Zhang room immediately. RN aware and working on room placement.    Bonnie Trieu A, PA-C 05/03/21 1247

## 2021-05-03 NOTE — H&P (Addendum)
Cardiology Admission History and Physical:   Patient ID: Bonnie Zhang MRN: [id number]; DOB: April 05, 1940   Admission date: 05/03/2021  PCP:  [name], MD   [facility] Providers Cardiologist:  Kate Sable, MD    Chief Complaint:  heart block  Patient Profile:   Bonnie Zhang is a 81 y.o. 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  History of Present Illness:   Bonnie Zhang has been seeing Dr. Garen Lah noting in Oct 2022, her her Toprol had been stopped previously 2/2 fatigue and HRs in the 40's and at that visit feeling better.  He saw her again today and again worsening SOB for a few weeks, and noted her HR again slow, EKG done noted 2:1 AVblock and referred to the ER at [facility] for EP evaluation and PPM.  LABS so far:  K+ 4.1 Mag 1.8 BUN/Creat 14/1.60 HS Trop 11 WBC 6.4 H/H 13/40 Plts 189  HOME meds reviewed: no nodal blocking agents  She has been feeling unusually weak, SOB for a couple weeks, no syncope, no CP At rest minimally symptomatic with stable BP   Past Medical History:  Diagnosis Date   Anticoagulated on Coumadin 2001   2 blood clots   Bruises easily    pt is on Coumadin;last dose of Coumadin 08/05/11 and then Lovenox started 08/07/11   DVT (deep vein thrombosis) in pregnancy     1965 post C section;2001 with prolonged driving while on  HRT   DVT (deep venous thrombosis) ([place]) 2001   Was on Prempro.     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 Gery Pray   Homocysteinemia 01/23/2015   Hx of migraines    yrs ago    Hypertension    takes  Benazepril,Amlodipine,and Metoprolol daily   Osteoporosis    PONV (postoperative nausea and vomiting)    Primary localized osteoarthritis of left knee    PVD (peripheral vascular disease) ([place]) 1965, 2001   abnormal venous doppler findings due to recurrent DVT'S left leg   Right knee DJD    knees   Shortness of breath dyspnea    TIA (transient ischemic attack) 2020   Vitamin B12 deficiency (non anemic) 01/24/2015   Vitamin D deficiency    takes VIt D daily    Past Surgical History:  Procedure Laterality Date   ABDOMINAL HYSTERECTOMY     APPENDECTOMY  04/01/1963   BREAST LUMPECTOMY  03/31/1985   CESAREAN SECTION  1961/1965/1966   X 3   CHOLECYSTECTOMY  04/01/1995   COLONOSCOPY W/ POLYPECTOMY  03/31/2005   Dr  Sharlett Iles; due? 2012   [place]  03/31/2010   uterine polyp, Dr Kennon Rounds   ESOPHAGOGASTRODUODENOSCOPY  04/01/1995   HYSTEROSCOPY WITH D & C N/A 08/05/2012   Procedure: DILATATION AND CURETTAGE /HYSTEROSCOPY;  Surgeon: Donnamae Jude, MD;  Location: [facility];  Service: Gynecology;  Laterality: N/A;   ROBOTIC ASSISTED TOTAL HYSTERECTOMY WITH BILATERAL SALPINGO OOPHERECTOMY Right 09/14/2012   Procedure: ROBOTIC ASSISTED TOTAL HYSTERECTOMY WITH BILATERAL SALPINGO OOPHORECTOMY ,right pelvic LYMPHNODE dissection;  Surgeon: Imagene Gurney A. Alycia Rossetti, MD;  Location: [facility];  Service: Gynecology;  Laterality: Right;   TOTAL KNEE ARTHROPLASTY  08/11/2011   Procedure: TOTAL KNEE ARTHROPLASTY;  Surgeon: Lorn Junes, MD;  Location: [place];  Service: Orthopedics;  Laterality: Right;  DR Ephriam Knuckles 90 MINUTES FOR THIS CASE   TOTAL KNEE ARTHROPLASTY Left 09/11/2014   Procedure: TOTAL KNEE ARTHROPLASTY;  Surgeon: Elsie Saas, MD;  Location: [place];  Service: Orthopedics;  Laterality: Left;   WISDOM TOOTH EXTRACTION       Medications Prior to Admission: Prior to Admission medications   Medication Sig Start Date End Date Taking? Authorizing Provider  acetaminophen (TYLENOL)  500 MG tablet Take 500 mg by mouth every 6 (six) hours as needed.    [provider]  amLODipine (NORVASC) 10 MG tablet Take 1 tablet (10 mg total) by mouth daily. 03/05/21   [name], MD  benazepril (LOTENSIN) 40 MG tablet TAKE 1 TABLET BY MOUTH DAILY. 03/26/21   [name], MD  cholecalciferol (VITAMIN D3) 25 MCG (1000 UNIT) tablet Take 2,000 Units by mouth daily.    [provider]  pantoprazole (PROTONIX) 40 MG tablet TAKE 1 TABLET BY MOUTH ONCE DAILY AS NEEDED FOR ACID REFLUX 10/15/20   [name], MD  simvastatin (ZOCOR) 10 MG tablet TAKE 1 TABLET BY MOUTH AT BEDTIME. 10/15/20   Burns, Claudina Lick, MD  spironolactone (ALDACTONE) 25 MG tablet Take 1 tablet (25 mg total) by mouth daily. 11/05/20   Kate Sable, MD  vitamin B-12 (CYANOCOBALAMIN) 1000 MCG tablet Take 1,000 mcg by mouth daily.    [provider]  warfarin (COUMADIN) 5 MG tablet Take 1/2 tablet daily except take 1 tablet on Mon and Thurs or take as directed by anticoagulation clinic 10/15/20   [name], MD     Allergies:    Allergies  Allergen Reactions   Vicodin [Hydrocodone-Acetaminophen]     Nightmares   Aspirin Other (See Comments)    upset stomach   Hctz [Hydrochlorothiazide] Other (See Comments)    FATIGUE AND EXTREMELY LOW POTASSIUM   Lipitor [Atorvastatin] Other (See Comments)    leg pain    Social History:   Social History   Socioeconomic History   Marital status: Divorced    Spouse name: Not on file   Number of children: 2   Years of education: Not on file   Highest education level: High school graduate  Occupational History   Occupation: Retired  Tobacco Use   Smoking status: Never   Smokeless tobacco: Never  Vaping Use   Vaping Use: Never used  Substance and Sexual Activity   Alcohol use: No   Drug use: No   Sexual activity: Not Currently    Birth control/protection: Post-menopausal    Comment: retired. has 2 daughters  Other Topics Concern   Not on  file  Social History Narrative   Pt lives alone she has 2 daughter - right handed- drinks tea, soda sometimes - No regular exercise   Social Determinants of Health   Financial Resource Strain: Low Risk    Difficulty of Paying Living Expenses: Not hard at all  Food Insecurity: No Food Insecurity   Worried About Charity fundraiser in the Last Year: Never true   Ran Out of Food in the Last Year: Never true  Transportation Needs: No Transportation Needs   Lack of Transportation (Medical): No   Lack of Transportation (Non-Medical): No  Physical Activity: Sufficiently Active   Days of Exercise  per Week: 5 days   Minutes of Exercise per Session: 30 min  Stress: No Stress Concern Present   Feeling of Stress : Not at all  Social Connections: Moderately Integrated   Frequency of Communication with Friends and Family: More than three times a week   Frequency of Social Gatherings with Friends and Family: More than three times a week   Attends Religious Services: More than 4 times per year   Active Member of [employer] or Organizations: Yes   Attends Archivist Meetings: More than 4 times per year   Marital Status: Widowed  Human resources officer Violence: Not At Risk   Fear of Current or Ex-Partner: No   Emotionally Abused: No   Physically Abused: No   Sexually Abused: No    Family History:   The patient's family history includes Colon cancer in her maternal grandmother; Deep vein thrombosis in her mother; Diabetes in an other family member; Heart failure in her maternal grandfather; Hypertension in her mother and sister; Kidney disease in her mother; Leukemia in her father; Liver disease in her brother. There is no history of Anesthesia problems, Hypotension, Malignant hyperthermia, Pseudochol deficiency, Stroke, Heart disease, Breast cancer, Ovarian cancer, Uterine cancer, Pancreatic cancer, or Cancer - Prostate.    ROS:  Please see the history of present illness.  All other ROS reviewed  and negative.     Physical Exam/Data:   Vitals:   05/03/21 1237 05/03/21 1300  BP: (!) 158/50 (!) 147/49  Pulse: (!) 41 (!) 38  Resp: 16 11  Temp: 98.3 F (36.8 C)   TempSrc: Oral   SpO2: 100% 100%   No intake or output data in the 24 hours ending 05/03/21 1317 Last 3 Weights 05/03/2021 05/01/2021 03/14/2021  Weight (lbs) 157 lb 159 lb 161 lb 12.8 oz  Weight (kg) 71.215 kg 72.122 kg 73.392 kg     There is no height or weight on file to calculate BMI.  General:  Well nourished, well developed, in no acute distress HEENT: normal Neck: no JVD Vascular: No carotid bruits; Distal pulses 2+ bilaterally   Cardiac:  RRR; bradycardic, no murmurs, gallops or rubs Lungs: CTA b/l, no wheezing, rhonchi or rales  Abd: soft, nontender, no hepatomegaly  Ext: no edema Musculoskeletal:  No deformities Skin: warm and dry  Neuro:  no focal abnormalities noted Psych:  Normal affect    EKG:  The ECG that was done today was personally reviewed and demonstrates  2:1 AVblock 42bpm, RBBB  Relevant CV Studies:  Prior notes Echo 09/2020 EF 60 to 78%, grade 2 diastolic dysfunction. Lexiscan 09/2020, no evidence for ischemia. Echo 6767 normal systolic function, impaired relaxation, EF 55 to 60%  10/09/20: TTE IMPRESSIONS   1. Left ventricular ejection fraction, by estimation, is 60 to 65%. The  left ventricle has normal function. The left ventricle has no regional  wall motion abnormalities. Left ventricular diastolic parameters are  consistent with Grade II diastolic  dysfunction (pseudonormalization).   2. Right ventricular systolic function is normal. The right ventricular  size is mildly enlarged. There is normal pulmonary artery systolic  pressure. The estimated right ventricular systolic pressure is 20.9 mmHg.   3. Left atrial size was mildly dilated.   4. The mitral valve is normal in structure. Mild mitral valve  regurgitation.   Laboratory Data:  High Sensitivity Troponin:  No results  for input(s): TROPONINIHS in the last 720 hours.    ChemistryNo results for input(s): NA, K, CL, CO2,  GLUCOSE, BUN, CREATININE, CALCIUM, MG, GFRNONAA, GFRAA, ANIONGAP in the last 168 hours.  No results for input(s): PROT, ALBUMIN, AST, ALT, ALKPHOS, BILITOT in the last 168 hours. Lipids No results for input(s): CHOL, TRIG, HDL, LABVLDL, LDLCALC, CHOLHDL in the last 168 hours. Hematology Recent Labs  Lab 05/03/21 1253  WBC 6.4  RBC 4.36  HGB 13.5  HCT 40.1  MCV 92.0  MCH 31.0  MCHC 33.7  RDW 12.9  PLT 189   Thyroid No results for input(s): TSH, FREET4 in the last 168 hours. BNPNo results for input(s): BNP, PROBNP in the last 168 hours.  DDimer No results for input(s): DDIMER in the last 168 hours.   Radiology/Studies:  No results found.   Assessment and Plan:   Symptomatic bradycardia Advanced heart block with 2:1 conduction No reversible causes She will ned PPM  Dr. Lovena Le has seen and examined the patient Discussed rational for PPM, the procedure, potential risks/benefits The patient is agreeable to proceed  INR is 2.5, acceptable to proceed   3. 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 Agree with the findings as noted above. 81 yo woman with a h/o RBBB and 2;1 AV blockseveral months ago which resolved with cessation of her beta blocker now with 2 weeks of recurrent fatigue and weakness and sob. She was found to be in 2:1 AV block. Her exam is unremarkable except for a regular bradycardia. Labs are reviewed. INR is 2.5. Tele with 2:1 AV block  We will proceed with DDD PM insertion and I have discussed the  indications/risks/benefits/goals/expectations of the procedure and she wishes to proceed.  Carleene Overlie Haze [place]illon,MD

## 2021-05-03 NOTE — ED Triage Notes (Signed)
Patient referred to [facility] for bradycardia, HR 42 in triage, plan for pacemaker on Monday. Patient reports fatigue, denies shortness of breath and acute pain, is alert, oriented, and in no apparent distress at this time.

## 2021-05-04 ENCOUNTER — Ambulatory Visit (HOSPITAL_COMMUNITY): Payer: Medicare Other

## 2021-05-04 DIAGNOSIS — Z7901 Long term (current) use of anticoagulants: Secondary | ICD-10-CM | POA: Diagnosis not present

## 2021-05-04 DIAGNOSIS — I441 Atrioventricular block, second degree: Secondary | ICD-10-CM | POA: Diagnosis not present

## 2021-05-04 DIAGNOSIS — Z8673 Personal history of transient ischemic attack (TIA), and cerebral infarction without residual deficits: Secondary | ICD-10-CM | POA: Diagnosis not present

## 2021-05-04 DIAGNOSIS — Z950 Presence of cardiac pacemaker: Secondary | ICD-10-CM | POA: Diagnosis not present

## 2021-05-04 DIAGNOSIS — Z20822 Contact with and (suspected) exposure to covid-19: Secondary | ICD-10-CM | POA: Diagnosis not present

## 2021-05-04 DIAGNOSIS — Z86718 Personal history of other venous thrombosis and embolism: Secondary | ICD-10-CM | POA: Diagnosis not present

## 2021-05-04 DIAGNOSIS — I10 Essential (primary) hypertension: Secondary | ICD-10-CM | POA: Diagnosis not present

## 2021-05-04 DIAGNOSIS — E7800 Pure hypercholesterolemia, unspecified: Secondary | ICD-10-CM | POA: Diagnosis not present

## 2021-05-04 DIAGNOSIS — D6851 Activated protein C resistance: Secondary | ICD-10-CM | POA: Diagnosis not present

## 2021-05-04 DIAGNOSIS — I4510 Unspecified right bundle-branch block: Secondary | ICD-10-CM | POA: Diagnosis not present

## 2021-05-04 DIAGNOSIS — R001 Bradycardia, unspecified: Secondary | ICD-10-CM | POA: Diagnosis not present

## 2021-05-04 LAB — TROPONIN I (HIGH SENSITIVITY): Troponin I (High Sensitivity): 176 ng/L (ref <18)

## 2021-05-04 NOTE — Progress Notes (Signed)
Patient Name: Bonnie Zhang     Patient Profile:    GAEL DELUDE is a 81 y.o. 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   SUBJECTIVE:adimtted with progressive SOB and found to be in 2:1 AVB Without chest pain or shortness of breath Some ear pain last night Tn 11>> 947   7/22 echo and myoview normal  Past Medical History:  Diagnosis Date   Anticoagulated on Coumadin 2001   2 blood clots   Bruises easily    pt is on Coumadin;last dose of Coumadin 08/05/11 and then Lovenox started 08/07/11   DVT (deep vein thrombosis) in pregnancy     1965 post C section;2001 with prolonged driving while on  HRT   DVT (deep venous thrombosis) ([place]) 2001   Was on Prempro.     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 Gery Pray   Homocysteinemia 01/23/2015   Hx of migraines    yrs ago    Hypertension    takes Benazepril,Amlodipine,and Metoprolol daily   Osteoporosis    PONV (postoperative nausea and vomiting)    Primary localized osteoarthritis of left knee    PVD (peripheral vascular disease) ([place]) 1965, 2001   abnormal venous doppler findings due to recurrent DVT'S left leg   Right knee DJD    knees   Shortness of breath dyspnea    TIA (transient ischemic attack) 2020   Vitamin B12 deficiency (non anemic) 01/24/2015   Vitamin D deficiency    takes VIt D daily    Scheduled Meds:  Scheduled Meds:  amLODipine  10 mg Oral Daily   benazepril  40 mg Oral Daily   cholecalciferol  2,000 Units Oral Daily   sodium chloride flush  3 mL Intravenous Q12H   spironolactone  25 mg Oral Daily   vitamin B-12   1,000 mcg Oral Daily   Continuous Infusions:  sodium chloride      ceFAZolin (ANCEF) IV Stopped (05/04/21 0410)   acetaminophen, ondansetron (ZOFRAN) IV, sodium chloride flush    PHYSICAL EXAM Vitals:   05/04/21 0020 05/04/21 0446 05/04/21 0447 05/04/21 0722  BP: (!) 100/41  (!) 117/58 (!) 131/52  Pulse: 60  62 70  Resp: 15 16 17 18   Temp: 97.8 F (36.6 C)  98.1 F (36.7 C) 97.8 F (36.6 C)  TempSrc: Oral  Oral Oral  SpO2: 95%   99%  Weight: 71.3 kg     Height:       Well developed and well nourished in no acute distress HENT normal  no ear pain Neck supple   Clear [place] withou hematoma, swelling or tenderness  Regular rate and rhythm, no  gallop No murmur Abd-soft   No Clubbing cyanosis  edema Skin-warm and dry A & Oriented  Grossly normal sensory and motor function     TELEMETRY: Reviewed personnally pt in P-synchronous/ AV  pacing :  ECG personally reviewed AV pacing with QRSd 117 msec   Intake/Output Summary (Last 24 hours) at 05/04/2021 0929 Last data filed at 05/04/2021 0858 Gross per 24 hour  Intake 647.67 ml  Output 400 ml  Net 247.67 ml    LABS: Basic Metabolic Panel: Recent Labs  Lab 05/03/21 1253  NA 139  K 4.1  CL 104  CO2 24  GLUCOSE 108*  BUN 14  CREATININE 1.60*  CALCIUM 10.2  MG 1.8   Cardiac Enzymes: No results for input(s): CKTOTAL, CKMB, CKMBINDEX, TROPONINI in the last 72 hours. CBC: Recent Labs  Lab 05/03/21 1253  WBC 6.4  NEUTROABS 4.4  HGB 13.5  HCT 40.1  MCV 92.0  PLT 189   PROTIME: Recent Labs    05/03/21 1253 05/03/21 1758  LABPROT 26.9* 28.0*  INR 2.5* 2.6*   Liver Function Tests: Recent Labs    05/03/21 1253  AST 20  ALT 13  ALKPHOS 86  BILITOT 0.9  PROT 7.6  ALBUMIN 4.4     Device Interrogation:normal  CXR normal LBBA lead position no ptx   ASSESSMENT AND PLAN:  Principal Problem:   AV block, Mobitz 2 Pacemaker LBBA Troponin elevation Ear pain    Pt underwent successful pM  implantation with LBBA pacing   the concern today is the Tn elevation.  Literature review describes freq elevation of Tn often 3-6 fold (we are 300)   2% have " extremely high" but cant find the definition and no data on LBBA where the lead is "bored" into the septum about 1 cm.  I would suspect this is within the range of what is expected.  Will recheck   I dont think her ear pain, which she has had before, is an anginal equivalent  Instructions given and anticipate discharge later today   Followup device -wound check and then 90 day followupCL in [place]  ( currently [facility] in [place]  -can we change? Thx )    Signed, Virl Axe MD  05/04/2021

## 2021-05-04 NOTE — Discharge Summary (Signed)
Discharge Summary    Patient ID: Bonnie Zhang MRN: [id number]; DOB: 1940-08-29  Admit date: 05/03/2021 Discharge date: 05/04/2021  PCP:  [name], MD   [facility] Providers Cardiologist:  Kate Sable, MD 1}     Discharge Diagnoses    Principal Problem:   AV block, Mobitz 2 Active Problems:   HYPERLIPIDEMIA   Essential (primary) hypertension   DVT, HX OF   Factor V Leiden carrier [facility])   Carotid artery disease ([place])   Long term (current) use of anticoagulants    Diagnostic Studies/Procedures    PPM implant 05/03/21: CONCLUSIONS:   1. Successful implantation of a Biotronik dual-chamber pacemaker for symptomatic bradycardia due to 2:1 AV block  2. No early apparent complications.  _____________   10/09/20: TTE IMPRESSIONS   1. Left ventricular ejection fraction, by estimation, is 60 to 65%. The  left ventricle has normal function. The left ventricle has no regional  wall motion abnormalities. Left ventricular diastolic parameters are  consistent with Grade II diastolic  dysfunction (pseudonormalization).   2. Right ventricular systolic function is normal. The right ventricular  size is mildly enlarged. There is normal pulmonary artery systolic  pressure. The estimated right ventricular systolic pressure is 72.6 mmHg.   3. Left atrial size was mildly dilated.   4. The mitral valve is normal in structure. Mild mitral valve  regurgitation.   History of Present Illness     Bonnie Zhang is a 81 y.o. 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   [name] has been seeing Dr. Garen Lah noting in Oct 2022, her her Toprol had been stopped previously 2/2 fatigue and HRs in the 40's and at that visit feeling better.   He saw her again today and again worsening SOB for a few weeks, and noted her HR again slow, EKG done noted 2:1 AVblock and referred  to the ER at [facility] for EP evaluation and PPM.   LABS so far:  K+ 4.1 Mag 1.8 BUN/Creat 14/1.60 HS Trop 11 WBC 6.4 H/H 13/40 Plts 189   HOME meds reviewed: no nodal blocking agents   She has been feeling unusually weak, SOB for a couple weeks, no syncope, no CP. At rest minimally symptomatic with stable BP.  Hospital Course     Consultants: none  Heart block Pt underwent successful PM implantation with LBBA pacing. Her HS troponin increased   11 --> 947   Per Dr. Caryl Comes: Literature review describes freq elevation of Tn often 3-6 fold (we are 300)   2% have " extremely high" but cant find the definition and no data on LBBA where the lead is "bored" into the septum about 1 cm.  I would suspect this is within the range of what is expected.    HS troponin on recheck was 176.   She would like to follow with Dr. Quentin Ore in [place]. I have messaged the office to help with changing appts.   Pt seen and examined by Dr. Caryl Comes and felt stable for discharge.    Did the patient have an acute coronary syndrome (MI, NSTEMI, STEMI, etc) this admission?:  No                               Did the patient have a percutaneous coronary intervention (stent / angioplasty)?:  No.  _____________  Discharge Vitals Blood pressure (!) 119/51, pulse 64, temperature 97.6 F (36.4 C), temperature source Oral, resp. rate 18, height 4\' 11"  (1.499 m), weight 71.3 kg, SpO2 97 %.  Filed Weights   05/03/21 1800 05/04/21 0020  Weight: 70.3 kg 71.3 kg    Labs & Radiologic Studies    CBC Recent Labs    05/03/21 1253  WBC 6.4  NEUTROABS 4.4  HGB 13.5  HCT 40.1  MCV 92.0  PLT 762   Basic Metabolic Panel Recent Labs    05/03/21 1253  NA 139  K 4.1  CL 104  CO2 24  GLUCOSE 108*  BUN 14  CREATININE 1.60*  CALCIUM 10.2  MG 1.8   Liver Function Tests Recent Labs    05/03/21 1253  AST 20  ALT 13  ALKPHOS 86  BILITOT 0.9  PROT 7.6  ALBUMIN 4.4   No results for input(s):  LIPASE, AMYLASE in the last 72 hours. High Sensitivity Troponin:   Recent Labs  Lab 05/03/21 1253 05/03/21 1758 05/04/21 1057  TROPONINIHS 11 947* 176*    BNP Invalid input(s): POCBNP D-Dimer No results for input(s): DDIMER in the last 72 hours. Hemoglobin A1C No results for input(s): HGBA1C in the last 72 hours. Fasting Lipid Panel No results for input(s): CHOL, HDL, LDLCALC, TRIG, CHOLHDL, LDLDIRECT in the last 72 hours. Thyroid Function Tests No results for input(s): TSH, T4TOTAL, T3FREE, THYROIDAB in the last 72 hours.  Invalid input(s): FREET3 _____________  DG Chest 2 View  Result Date: 05/04/2021 CLINICAL DATA:  Pacemaker placement. EXAM: CHEST - 2 VIEW COMPARISON:  05/03/2021 FINDINGS: Left-sided dual lead permanent pacemaker is new in the interval. No pneumothorax or pleural effusion. The cardiopericardial silhouette is within normal limits for size. The visualized bony structures of the thorax show no acute abnormality. IMPRESSION: New left-sided permanent pacemaker. No pneumothorax. Electronically Signed   By: Misty Stanley M.D.   On: 05/04/2021 08:06   EP PPM/ICD IMPLANT  Result Date: 05/03/2021 CONCLUSIONS:  1. Successful implantation of a Biotronik dual-chamber pacemaker for symptomatic bradycardia due to 2:1 AV block  2. No early apparent complications.       Cristopher Peru, MD 05/03/2021 4:40 PM    DG Chest Portable 1 View  Result Date: 05/03/2021 CLINICAL DATA:  Shortness of breath, fatigue EXAM: PORTABLE CHEST 1 VIEW COMPARISON:  08/31/2014 FINDINGS: Cardiomegaly. Both lungs are clear. The visualized skeletal structures are unremarkable. IMPRESSION: Cardiomegaly without acute abnormality of the lungs in AP portable projection. Electronically Signed   By: Delanna Ahmadi M.D.   On: 05/03/2021 13:31   Disposition   Pt is being discharged home today in good condition.  Follow-up Plans & Appointments   Office will call with follow up appt.  Discharge Instructions      Diet - low sodium heart healthy   Complete by: As directed    Increase activity slowly   Complete by: As directed        Discharge Medications   Allergies as of 05/04/2021       Reactions   Vicodin [hydrocodone-acetaminophen]    Nightmares   Aspirin Other (See Comments)   upset stomach   Hctz [hydrochlorothiazide] Other (See Comments)   FATIGUE AND EXTREMELY LOW POTASSIUM   Lipitor [atorvastatin] Other (See Comments)   leg pain        Medication List     TAKE these medications    acetaminophen 500 MG tablet Commonly known as: TYLENOL Take 500-1,000 mg by  mouth every 6 (six) hours as needed for mild pain.   amLODipine 10 MG tablet Commonly known as: NORVASC Take 1 tablet (10 mg total) by mouth daily.   benazepril 40 MG tablet Commonly known as: LOTENSIN TAKE 1 TABLET BY MOUTH DAILY.   cholecalciferol 25 MCG (1000 UNIT) tablet Commonly known as: VITAMIN D3 Take 2,000 Units by mouth daily.   pantoprazole 40 MG tablet Commonly known as: PROTONIX TAKE 1 TABLET BY MOUTH ONCE DAILY AS NEEDED FOR ACID REFLUX What changed: See the new instructions.   simvastatin 10 MG tablet Commonly known as: ZOCOR TAKE 1 TABLET BY MOUTH AT BEDTIME.   spironolactone 25 MG tablet Commonly known as: ALDACTONE Take 1 tablet (25 mg total) by mouth daily.   vitamin B-12 1000 MCG tablet Commonly known as: CYANOCOBALAMIN Take 1,000 mcg by mouth daily.   warfarin 5 MG tablet Commonly known as: COUMADIN Take as directed. If you are unsure how to take this medication, talk to your nurse or doctor. Original instructions: Take 1/2 tablet daily except take 1 tablet on Mon and Thurs or take as directed by anticoagulation clinic What changed:  how much to take how to take this when to take this additional instructions           Outstanding Labs/Studies   none  Duration of Discharge Encounter   Greater than 30 minutes including physician time.  Signed, [place],  PA 05/04/2021, 1:03 PM

## 2021-05-06 ENCOUNTER — Encounter (HOSPITAL_COMMUNITY): Payer: Self-pay | Admitting: Internal Medicine

## 2021-05-07 MED FILL — Gentamicin Sulfate Inj 40 MG/ML: INTRAMUSCULAR | Qty: 80 | Status: AC

## 2021-05-07 MED FILL — Heparin Sod (Porcine)-NaCl IV Soln 1000 Unit/500ML-0.9%: INTRAVENOUS | Qty: 500 | Status: AC

## 2021-05-08 ENCOUNTER — Other Ambulatory Visit: Payer: Self-pay | Admitting: Internal Medicine

## 2021-05-09 ENCOUNTER — Other Ambulatory Visit: Payer: Self-pay

## 2021-05-09 MED ORDER — SPIRONOLACTONE 25 MG PO TABS: 25.0000 mg | ORAL_TABLET | Freq: Every day | ORAL | 3 refills | Status: DC

## 2021-05-14 ENCOUNTER — Other Ambulatory Visit: Payer: Self-pay

## 2021-05-14 ENCOUNTER — Ambulatory Visit (INDEPENDENT_AMBULATORY_CARE_PROVIDER_SITE_OTHER): Payer: Medicare Other

## 2021-05-14 DIAGNOSIS — I441 Atrioventricular block, second degree: Secondary | ICD-10-CM

## 2021-05-14 NOTE — Patient Instructions (Signed)

## 2021-05-14 NOTE — Progress Notes (Signed)
Subjective:    Patient ID: Bonnie Zhang, female    DOB: 1940/12/19, 81 y.o.   MRN: [id number]  This visit occurred during the SARS-CoV-2 public health emergency.  Safety protocols were in place, including screening questions prior to the visit, additional usage of staff PPE, and extensive cleaning of exam room while observing appropriate contact time as indicated for disinfecting solutions.    HPI The patient is here for an acute visit.   Left ear concerns:  it started hurting 2-3 weeks ago.  The pain is intermittent.  It hurts when she lays on that side.  The pain is inside the ear.  No cold symptoms.  When it first started she thought it was TMJ.  She could barely open her mouth.  She had TMJ years ago.  Sometimes it is worse with open mouth wide or eat.     Nocturia every 1.5 hr-2 hrs.  Stops drinking most fluids around 3 PM.  Usually she is able to get back to sleep after she gets up and goes to the bathroom.  She is not really sure if she wants to take any new medication or not.     Medications and allergies reviewed with patient and updated if appropriate.  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 05/03/2021   Mid back pain 03/05/2021   SOB (shortness of breath) on exertion 09/26/2020   Episodic lightheadedness 09/26/2020   Fatigue 09/25/2020   Endometrial cancer ([place]) 08/19/2019   Brain mass 08/12/2019   TIA (transient ischemic attack) 01/03/2019   Hyperglycemia 06/29/2017   Vertigo 04/07/2017   Long term (current) use of anticoagulants 01/07/2017   Carotid artery disease ([place]) 02/01/2016   Bilateral leg edema 06/21/2015   Vitamin B12 deficiency (non anemic) 01/24/2015   Homocysteinemia 01/23/2015   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 10/09/2010   Osteoporosis 03/21/2010   History of colonic polyps 03/20/2008   Vitamin D deficiency 08/25/2007   HYPERLIPIDEMIA 41/66/0630   Dysmetabolic syndrome X 16/03/930   Essential (primary) hypertension 03/18/2007   DVT, HX OF 07/23/2006    Current Outpatient Medications on File Prior to Visit  Medication Sig Dispense Refill   acetaminophen (TYLENOL) 500 MG tablet Take 500-1,000 mg by mouth every 6 (six) hours as needed for mild pain.     amLODipine (NORVASC) 10 MG tablet Take 1 tablet (10 mg total) by mouth daily. 90 tablet 3   benazepril (LOTENSIN) 40 MG tablet TAKE 1 TABLET BY MOUTH DAILY. (Patient taking differently: Take 40 mg by mouth daily.) 90 tablet 1   cholecalciferol (VITAMIN D3) 25 MCG (1000 UNIT) tablet Take 2,000 Units by mouth daily.     pantoprazole (PROTONIX) 40 MG tablet TAKE 1 TABLET BY MOUTH ONCE DAILY AS NEEDED FOR ACID REFLUX (Patient taking differently: 40 mg daily as needed (acid reflux).) 90 tablet 0   simvastatin (ZOCOR) 10 MG tablet TAKE 1 TABLET BY MOUTH AT BEDTIME. 90 tablet 1   spironolactone (ALDACTONE) 25 MG tablet Take 1 tablet (25 mg total) by mouth daily. 90 tablet 3   vitamin B-12 (CYANOCOBALAMIN) 1000 MCG tablet Take 1,000 mcg by mouth daily.     warfarin (COUMADIN) 5 MG tablet  Take 1/2 tablet daily except take 1 tablet on Mon and Thurs or take as directed by anticoagulation clinic (Patient taking differently: Take 2.5-5 mg by mouth See admin instructions. Take 1/2 tablet = 2.5 mg daily except take 1 tablet  = 5 mg on Mon and Thurs or take as directed by anticoagulation clinic) 90 tablet 1   No current facility-administered medications on file prior to visit.    Past Medical History:  Diagnosis Date   Anticoagulated on Coumadin 2001   2 blood clots   Bruises easily    pt is on Coumadin;last dose of Coumadin 08/05/11 and then Lovenox started 08/07/11   DVT (deep vein  thrombosis) in pregnancy     1965 post C section;2001 with prolonged driving while on  HRT   DVT (deep venous thrombosis) ([place]) 2001   Was on Prempro.     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 Gery Pray   Homocysteinemia 01/23/2015   Hx of migraines    yrs ago    Hypertension    takes Benazepril,Amlodipine,and Metoprolol daily   Osteoporosis    PONV (postoperative nausea and vomiting)    Primary localized osteoarthritis of left knee    PVD (peripheral vascular disease) ([place]) 1965, 2001   abnormal venous doppler findings due to recurrent DVT'S left leg   Right knee DJD    knees   Shortness of breath dyspnea    TIA (transient ischemic attack) 2020   Vitamin B12 deficiency (non anemic) 01/24/2015   Vitamin D deficiency    takes VIt D daily    Past Surgical History:  Procedure Laterality Date   ABDOMINAL HYSTERECTOMY     APPENDECTOMY  04/01/1963   BREAST LUMPECTOMY  03/31/1985   CESAREAN SECTION  1961/1965/1966   X 3   CHOLECYSTECTOMY  04/01/1995   COLONOSCOPY W/ POLYPECTOMY  03/31/2005   Dr  Sharlett Iles; due? 2012   [place]  03/31/2010   uterine polyp, Dr Kennon Rounds   ESOPHAGOGASTRODUODENOSCOPY  04/01/1995   HYSTEROSCOPY WITH D & C N/A 08/05/2012   Procedure: DILATATION AND CURETTAGE /HYSTEROSCOPY;  Surgeon: Donnamae Jude, MD;  Location: [facility];  Service: Gynecology;  Laterality: N/A;   PACEMAKER IMPLANT N/A 05/03/2021   Procedure: PACEMAKER IMPLANT;  Surgeon: Evans Lance, MD;  Location: [facility];  Service: Cardiovascular;  Laterality: N/A;   ROBOTIC ASSISTED TOTAL HYSTERECTOMY WITH BILATERAL SALPINGO OOPHERECTOMY Right 09/14/2012   Procedure: ROBOTIC ASSISTED TOTAL  HYSTERECTOMY WITH BILATERAL SALPINGO OOPHORECTOMY ,right pelvic LYMPHNODE dissection;  Surgeon: Imagene Gurney A. Alycia Rossetti, MD;  Location: [facility];  Service: Gynecology;  Laterality: Right;   TOTAL KNEE ARTHROPLASTY  08/11/2011   Procedure: TOTAL KNEE ARTHROPLASTY;  Surgeon: Lorn Junes, MD;  Location: [facility];  Service: Orthopedics;  Laterality: Right;  DR Ephriam Knuckles 90 MINUTES FOR THIS CASE   TOTAL KNEE ARTHROPLASTY Left 09/11/2014   Procedure: TOTAL KNEE ARTHROPLASTY;  Surgeon: Elsie Saas, MD;  Location: [facility];  Service: Orthopedics;  Laterality: Left;   WISDOM TOOTH EXTRACTION      Social History   Socioeconomic History   Marital status: Divorced    Spouse name: Not on file  Number of children: 2   Years of education: Not on file   Highest education level: High school graduate  Occupational History   Occupation: Retired  Tobacco Use   Smoking status: Never   Smokeless tobacco: Never  Vaping Use   Vaping Use: Never used  Substance and Sexual Activity   Alcohol use: No   Drug use: No   Sexual activity: Not Currently    Birth control/protection: Post-menopausal    Comment: retired. has 2 daughters  Other Topics Concern   Not on file  Social History Narrative   Pt lives alone she has 2 daughter - right handed- drinks tea, soda sometimes - No regular exercise   Social Determinants of Health   Financial Resource Strain: Low Risk    Difficulty of Paying Living Expenses: Not hard at all  Food Insecurity: No Food Insecurity   Worried About Charity fundraiser in the Last Year: Never true   Ran Out of Food in the Last Year: Never true  Transportation Needs: No Transportation Needs   Lack of Transportation (Medical): No   Lack of Transportation (Non-Medical): No  Physical Activity: Sufficiently Active   Days of Exercise per Week: 5 days   Minutes of Exercise per Session: 30 min  Stress: No Stress Concern Present   Feeling of Stress : Not at all  Social Connections: Moderately  Integrated   Frequency of Communication with Friends and Family: More than three times a week   Frequency of Social Gatherings with Friends and Family: More than three times a week   Attends Religious Services: More than 4 times per year   Active Member of [employer] or Organizations: Yes   Attends Archivist Meetings: More than 4 times per year   Marital Status: Widowed    Family History  Problem Relation Age of Onset   Kidney disease Mother        ? hypertensive   Hypertension Mother    Deep vein thrombosis Mother        post ankle fracture   Leukemia Father    Hypertension Sister    Liver disease Brother    Colon cancer Maternal Grandmother    Heart failure Maternal Grandfather    Diabetes Other        cousins   Anesthesia problems Neg Hx    Hypotension Neg Hx    Malignant hyperthermia Neg Hx    Pseudochol deficiency Neg Hx    Stroke Neg Hx    Heart disease Neg Hx    Breast cancer Neg Hx    Ovarian cancer Neg Hx    Uterine cancer Neg Hx    Pancreatic cancer Neg Hx    Cancer - Prostate Neg Hx     Review of Systems  Constitutional:  Negative for fever.  HENT:  Positive for ear pain (left). Negative for congestion, ear discharge, hearing loss, postnasal drip, sinus pressure and sore throat.   Neurological:  Positive for headaches (mild near left ear).      Objective:   Vitals:   05/15/21 0852  BP: 122/60  Pulse: 78  Temp: 97.9 F (36.6 C)  SpO2: 98%   BP Readings from Last 3 Encounters:  05/15/21 122/60  05/04/21 (!) 119/51  05/03/21 140/60   Wt Readings from Last 3 Encounters:  05/15/21 156 lb 6.4 oz (70.9 kg)  05/04/21 157 lb 3.2 oz (71.3 kg)  05/03/21 157 lb (71.2 kg)   Body mass index is 31.59 kg/m.  Physical Exam Constitutional:      General: She is not in acute distress.    Appearance: Normal appearance. She is not ill-appearing.  HENT:     Head: Normocephalic and atraumatic.     Right Ear: Tympanic membrane, ear canal and external  ear normal.     Left Ear: Tympanic membrane, ear canal and external ear normal.     Ears:     Comments: Slight tenderness with palpation anterior to left ear    Mouth/Throat:     Mouth: Mucous membranes are moist.     Pharynx: No posterior oropharyngeal erythema.  Eyes:     Conjunctiva/sclera: Conjunctivae normal.  Musculoskeletal:     Cervical back: Neck supple. No tenderness.  Lymphadenopathy:     Cervical: No cervical adenopathy.  Skin:    General: Skin is warm and dry.  Neurological:     Mental Status: She is alert.           Assessment & Plan:    See Problem List for Assessment and Plan of chronic medical problems.

## 2021-05-14 NOTE — Patient Instructions (Addendum)
° ° ° °  Your ear looks normal.  You likely have TMJ.  Try applying voltaren gel in front of the ear a few times a day for several days.

## 2021-05-15 ENCOUNTER — Ambulatory Visit (INDEPENDENT_AMBULATORY_CARE_PROVIDER_SITE_OTHER): Payer: Medicare Other | Admitting: Internal Medicine

## 2021-05-15 ENCOUNTER — Encounter: Payer: Self-pay | Admitting: Internal Medicine

## 2021-05-15 DIAGNOSIS — M26622 Arthralgia of left temporomandibular joint: Secondary | ICD-10-CM

## 2021-05-15 DIAGNOSIS — M26629 Arthralgia of temporomandibular joint, unspecified side: Secondary | ICD-10-CM | POA: Insufficient documentation

## 2021-05-15 NOTE — Assessment & Plan Note (Signed)
Acute Symptoms consistent with TMJ Ear exam is completely normal, except mild tenderness anterior to left ear She does have some pain with eating and opening her mouth at times and does have a history of TMJ Should not take oral NSAIDs Trial of topical Voltaren gel 3-4 times a day Avoid opening mouth excessively or excessive chewing If no improvement would recommend seeing oral surgery

## 2021-05-16 ENCOUNTER — Other Ambulatory Visit: Payer: Self-pay

## 2021-05-16 ENCOUNTER — Ambulatory Visit (INDEPENDENT_AMBULATORY_CARE_PROVIDER_SITE_OTHER): Payer: Medicare Other

## 2021-05-16 DIAGNOSIS — Z7901 Long term (current) use of anticoagulants: Secondary | ICD-10-CM | POA: Diagnosis not present

## 2021-05-16 LAB — POCT INR: INR: 2.5 (ref 2.0–3.0)

## 2021-05-16 NOTE — Patient Instructions (Addendum)
Pre visit review using our clinic review tool, if applicable. No additional management support is needed unless otherwise documented below in the visit note.  Continue 1/2 tablet daily except 1 tablet on Mondays and Thursdays.  Re-check in to 6 weeks.   

## 2021-05-16 NOTE — Progress Notes (Signed)
Continue 1/2 tablet daily except 1 tablet on Mondays and Thursdays.  Re-check in to 6 weeks.   

## 2021-05-20 ENCOUNTER — Telehealth: Payer: Self-pay | Admitting: *Deleted

## 2021-05-20 NOTE — Telephone Encounter (Signed)
RETURNED PATIENT'S PHONE CALL, SPOKE WITH PATIENT. ?

## 2021-05-22 NOTE — Progress Notes (Signed)
Radiation Oncology         [phone] ________________________________  Name: Bonnie Zhang MRN: [id number]  Date: 05/23/2021  DOB: [birth year]  Follow-Up Visit Note  CC: [name], MD  [name], MD    ICD-10-CM   1. 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 Since her last visit, the patient presented to Joylene John NP on 01/16/21 for evaluation of of pink discharge when using her vaginal dilator. The patient endorsed pink discharge on the dilator after use which started in the beginning of October. Otherwise, the patient was noted to be doing well other than ongoing increased urination at night. The patient also indicated that she has a colonoscopy the week prior to this visit and had 5 polyps removed.   The patient again followed up with Dr. Berline Lopes on 03/01/21 and reported improvement of her discharge with only occasional pink spots intermittently when she uses the dilator. She also endorsed: using her vaginal dilator around 3 times per week, a decrease in appetite, and about a 20 pound unintentional weight loss, and increased urinary frequency at night. Given her unintentional weight loss, Dr. Berline Lopes recommended a CT of the abdomen and pelvis to rule out cancer recurrence. Otherwise, the patient was noted as NED on examination.   CT of the abdomen and pelvis with contrast on 03/07/21 demonstrated no evidence of disease recurrence or  metastatic disease in the abdomen or pelvis, (and a small hiatal hernia).       On 05/03/21, the patient presented to the ED for evaluation of an advanced heart block. The patient saw her cardiologist prior to presentation and reported worsening SOB and weakness for a few weeks. EKG performed noted a 2:1 AVblock and she was referred to the ED. Subsequently, the patient was admitted and had a left sided pacemaker placed on 02/03.  Imaging performed while inpatient includes:  -- CXR on 02/02 which showed cardiomegaly without acute abnormality of the lungs.   Other pertinent imaging includes: DEXA for bone density on 01/02/21 which revealed findings consistent with osteoporosis.   She denies any further issues with vaginal spotting.  She continues to use her vaginal dilator as recommended.  Denies any hematuria or rectal bleeding.  She denies any abdominal bloating.    Allergies:  is allergic to vicodin [hydrocodone-acetaminophen], aspirin, hctz [hydrochlorothiazide], and lipitor [atorvastatin].  Meds: Current Outpatient Medications  Medication Sig Dispense Refill   acetaminophen (TYLENOL) 500 MG tablet Take 500-1,000 mg by mouth every 6 (six) hours as needed for mild pain.     amLODipine (NORVASC) 10 MG tablet Take 1 tablet (10 mg total) by mouth daily. 90 tablet 3   benazepril (LOTENSIN) 40 MG tablet TAKE 1 TABLET BY MOUTH DAILY. (Patient taking differently: Take 40 mg by mouth daily.) 90 tablet 1   cholecalciferol (VITAMIN D3) 25 MCG (1000 UNIT) tablet Take 2,000 Units by mouth daily.     pantoprazole (PROTONIX) 40 MG tablet  TAKE 1 TABLET BY MOUTH ONCE DAILY AS NEEDED FOR ACID REFLUX (Patient taking differently: 40 mg daily as needed (acid reflux).) 90 tablet 0   simvastatin (ZOCOR) 10 MG tablet TAKE 1 TABLET BY MOUTH AT BEDTIME. 90 tablet 1   spironolactone (ALDACTONE) 25 MG tablet Take 1 tablet (25 mg total) by mouth daily. 90 tablet 3   vitamin B-12 (CYANOCOBALAMIN) 1000 MCG tablet Take  1,000 mcg by mouth daily.     warfarin (COUMADIN) 5 MG tablet Take 1/2 tablet daily except take 1 tablet on Mon and Thurs or take as directed by anticoagulation clinic (Patient taking differently: Take 2.5-5 mg by mouth See admin instructions. Take 1/2 tablet = 2.5 mg daily except take 1 tablet  = 5 mg on Mon and Thurs or take as directed by anticoagulation clinic) 90 tablet 1   No current facility-administered medications for this encounter.    Physical Findings: The patient is in no acute distress. Patient is alert and oriented.  height is 4\' 11"  (1.499 m) and weight is 159 lb 4 oz (72.2 kg). Her temporal temperature is 97 F (36.1 C) (abnormal). Her blood pressure is 148/52 (abnormal) and her pulse is 67. Her respiration is 18 and oxygen saturation is 100%. .  Lungs are clear to auscultation bilaterally. Heart has regular rate and rhythm. No palpable cervical, supraclavicular, or axillary adenopathy. Abdomen soft, non-tender, normal bowel sounds.  On pelvic examination the external genitalia were unremarkable. A speculum exam was performed. There are no mucosal lesions noted in the vaginal vault.  On bimanual and rectovaginal examination there were no pelvic masses appreciated.  Vaginal cuff intact.  Some radiation changes noted at the vaginal cuff.  The vaginal vault is significantly narrowed but does permit index finger.  Rectal sphincter tone normal.    Lab Findings: Lab Results  Component Value Date   WBC 6.4 05/03/2021   HGB 13.5 05/03/2021   HCT 40.1 05/03/2021   MCV 92.0 05/03/2021   PLT 189 05/03/2021    Radiographic Findings: DG Chest 2 View  Result Date: 05/04/2021 CLINICAL DATA:  Pacemaker placement. EXAM: CHEST - 2 VIEW COMPARISON:  05/03/2021 FINDINGS: Left-sided dual lead permanent pacemaker is new in the interval. No pneumothorax or pleural effusion. The cardiopericardial silhouette is within normal limits for size. The visualized bony structures of the thorax show no  acute abnormality. IMPRESSION: New left-sided permanent pacemaker. No pneumothorax. Electronically Signed   By: Misty Stanley M.D.   On: 05/04/2021 08:06   EP PPM/ICD IMPLANT  Result Date: 05/03/2021 CONCLUSIONS:  1. Successful implantation of a Biotronik dual-chamber pacemaker for symptomatic bradycardia due to 2:1 AV block  2. No early apparent complications.       Cristopher Peru, MD 05/03/2021 4:40 PM    DG Chest Portable 1 View  Result Date: 05/03/2021 CLINICAL DATA:  Shortness of breath, fatigue EXAM: PORTABLE CHEST 1 VIEW COMPARISON:  08/31/2014 FINDINGS: Cardiomegaly. Both lungs are clear. The visualized skeletal structures are unremarkable. IMPRESSION: Cardiomegaly without acute abnormality of the lungs in AP portable projection. Electronically Signed   By: Delanna Ahmadi M.D.   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  Plan: She is scheduled for follow-up with Dr. Berline Lopes in May.  Routine follow-up in radiation oncology in August.   20 minutes of total time was spent for this  patient encounter, including preparation, face-to-face counseling with the patient and coordination of care, physical exam, and documentation of the encounter. ____________________________________  Blair Promise, PhD, MD   This document serves as a record of services personally performed by Gery Pray, MD. It was created on his behalf by Roney Mans, a trained medical scribe. The creation of this record is based on the scribe's personal observations and the provider's statements to them. This document has been checked and approved by the attending provider.

## 2021-05-23 ENCOUNTER — Encounter: Payer: Self-pay | Admitting: Radiation Oncology

## 2021-05-23 ENCOUNTER — Ambulatory Visit
Admission: RE | Admit: 2021-05-23 | Discharge: 2021-05-23 | Disposition: A | Discharge status: Home / Self Care | Payer: Medicare Other | Source: Ambulatory Visit | Attending: Radiation Oncology | Admitting: Radiation Oncology

## 2021-05-23 ENCOUNTER — Other Ambulatory Visit: Payer: Self-pay

## 2021-05-23 VITALS — BP 148/52 | HR 67 | Temp 97.0°F | Resp 18 | Ht 59.0 in | Wt 159.2 lb

## 2021-05-23 DIAGNOSIS — Z923 Personal history of irradiation: Secondary | ICD-10-CM | POA: Insufficient documentation

## 2021-05-23 DIAGNOSIS — Z08 Encounter for follow-up examination after completed treatment for malignant neoplasm: Secondary | ICD-10-CM | POA: Diagnosis not present

## 2021-05-23 DIAGNOSIS — Z79899 Other long term (current) drug therapy: Secondary | ICD-10-CM | POA: Insufficient documentation

## 2021-05-23 DIAGNOSIS — Z8542 Personal history of malignant neoplasm of other parts of uterus: Secondary | ICD-10-CM | POA: Insufficient documentation

## 2021-05-23 DIAGNOSIS — C541 Malignant neoplasm of endometrium: Secondary | ICD-10-CM

## 2021-05-23 DIAGNOSIS — I517 Cardiomegaly: Secondary | ICD-10-CM | POA: Diagnosis not present

## 2021-05-23 DIAGNOSIS — Z7901 Long term (current) use of anticoagulants: Secondary | ICD-10-CM | POA: Diagnosis not present

## 2021-05-23 NOTE — Progress Notes (Signed)
Bonnie Zhang is here today for follow up post radiation to the pelvic.  They completed their radiation on: 11/23/19  Does the patient complain of any of the following:  Pain:None Abdominal bloating: None Diarrhea/Constipation: None Nausea/Vomiting: None Vaginal Discharge: None Blood in Urine or Stool: None Urinary Issues (dysuria/incomplete emptying/ incontinence/ increased frequency/urgency): Nocturia x 3-4 Denies any of issues Does patient report using vaginal dilator 2-3 times a week and/or sexually active 2-3 weeks: States that she is using dilator 2-3 times per week Post radiation skin changes: None Vitals:   05/23/21 0822  BP: (!) 148/52  Pulse: 67  Resp: 18  Temp: (!) 97 F (36.1 C)  TempSrc: Temporal  SpO2: 100%  Weight: 72.2 kg  Height: 4\' 11"  (1.499 m)      Additional comments if applicable:

## 2021-05-23 NOTE — Addendum Note (Signed)
Encounter addended by: Sherrlyn Hock, RN on: 05/23/2021 9:14 AM  Actions taken: Allergies reviewed

## 2021-05-27 LAB — CUP PACEART INCLINIC DEVICE CHECK
Date Time Interrogation Session: 20230214165802
Implantable Lead Implant Date: 20230203
Implantable Lead Implant Date: 20230203
Implantable Lead Location: 753859
Implantable Lead Location: 753860
Implantable Lead Model: 377171
Implantable Lead Model: 377171
Implantable Lead Serial Number: 8000600801
Implantable Lead Serial Number: 8000640919
Implantable Pulse Generator Implant Date: 20230203
Pulse Gen Model: 407145
Pulse Gen Serial Number: 70290684

## 2021-05-27 NOTE — Progress Notes (Signed)
Wound check appointment. Steri-strips removed. Wound without redness or edema. Incision edges approximated, wound well healed. Normal device function. Thresholds, sensing, and impedances consistent with implant measurements. Device programmed at 3.5V/auto capture programmed on for extra safety margin until 3 month visit. Histogram distribution appropriate for patient and level of activity. No mode switches or high ventricular rates noted. Patient educated about wound care, arm mobility, lifting restrictions. ROV in 3 months with implanting physician.  See scanned media. Error in saving.

## 2021-06-17 ENCOUNTER — Other Ambulatory Visit: Payer: Self-pay | Admitting: Internal Medicine

## 2021-06-25 ENCOUNTER — Ambulatory Visit (INDEPENDENT_AMBULATORY_CARE_PROVIDER_SITE_OTHER): Payer: Medicare Other

## 2021-06-25 DIAGNOSIS — Z7901 Long term (current) use of anticoagulants: Secondary | ICD-10-CM | POA: Diagnosis not present

## 2021-06-25 LAB — POCT INR: INR: 2.1 (ref 2.0–3.0)

## 2021-06-25 NOTE — Progress Notes (Signed)
Continue 1/2 tablet daily except 1 tablet on Mondays and Thursdays.  Re-check in to 6 weeks.   

## 2021-06-25 NOTE — Patient Instructions (Addendum)
Pre visit review using our clinic review tool, if applicable. No additional management support is needed unless otherwise documented below in the visit note.  Continue 1/2 tablet daily except 1 tablet on Mondays and Thursdays.  Re-check in to 6 weeks.   

## 2021-08-01 ENCOUNTER — Ambulatory Visit: Payer: Medicare Other | Admitting: Cardiology

## 2021-08-02 ENCOUNTER — Ambulatory Visit: Payer: Medicare Other | Admitting: Cardiology

## 2021-08-02 ENCOUNTER — Encounter: Payer: Self-pay | Admitting: Cardiology

## 2021-08-02 VITALS — BP 136/62 | HR 68 | Ht 60.0 in | Wt 167.0 lb

## 2021-08-02 DIAGNOSIS — R0602 Shortness of breath: Secondary | ICD-10-CM | POA: Diagnosis not present

## 2021-08-02 DIAGNOSIS — E7800 Pure hypercholesterolemia, unspecified: Secondary | ICD-10-CM

## 2021-08-02 DIAGNOSIS — I441 Atrioventricular block, second degree: Secondary | ICD-10-CM | POA: Diagnosis not present

## 2021-08-02 DIAGNOSIS — I10 Essential (primary) hypertension: Secondary | ICD-10-CM

## 2021-08-02 NOTE — Patient Instructions (Signed)
Medication Instructions:  ? ?Your physician recommends that you continue on your current medications as directed. Please refer to the Current Medication list given to you today. ? ?*If you need a refill on your cardiac medications before your next appointment, please call your pharmacy* ? ? ?Lab Work: ? ?None ordered ? ?If you have labs (blood work) drawn today and your tests are completely normal, you will receive your results only by: ?MyChart Message (if you have MyChart) OR ?A paper copy in the mail ?If you have any lab test that is abnormal or we need to change your treatment, we will call you to review the results. ? ? ?Testing/Procedures: ? ?Your physician has requested that you have an echocardiogram in 2 months Echocardiography is a painless test that uses sound waves to create images of your heart. It provides your doctor with information about the size and shape of your heart and how well your heart?s chambers and valves are working. This procedure takes approximately one hour. There are no restrictions for this procedure. ? ? ? ?Follow-Up: ?At [facility], you and your health needs are our priority.  As part of our continuing mission to provide you with exceptional heart care, we have created designated Provider Care Teams.  These Care Teams include your primary Cardiologist (physician) and Advanced Practice Providers (APPs -  Physician Assistants and Nurse Practitioners) who all work together to provide you with the care you need, when you need it. ? ?We recommend signing up for the patient portal called "MyChart".  Sign up information is provided on this After Visit Summary.  MyChart is used to connect with patients for Virtual Visits (Telemedicine).  Patients are able to view lab/test results, encounter notes, upcoming appointments, etc.  Non-urgent messages can be sent to your provider as well.   ?To learn more about what you can do with MyChart, go to [email or web address].   ? ?Your next  appointment:   ?6 month(s) ? ?The format for your next appointment:   ?In Person ? ?Provider:   ?You may see Kate Sable, MD or one of the following Advanced Practice Providers on your designated Care Team:   ?Murray Hodgkins, NP ?Christell Faith, PA-C ?Cadence Kathlen Mody, PA-C  ? ? ?Other Instructions ? ? ?Important Information About Sugar ? ? ? ? ? ? ?

## 2021-08-02 NOTE — Progress Notes (Signed)
?Cardiology Office Note:   ? ?Date:  08/02/2021  ? ?ID:  Bonnie Zhang, DOB [birth year], MRN [id number] ? ?PCP:  [name], MD ?  ?[facility] Providers ?Cardiologist:  Kate Sable, MD    ? ?Referring MD: [name], MD  ? ?Chief Complaint  ?Patient presents with  ? Other  ?  3 month follow up -- Patient c.o SOB. Meds reviewed verbally with patient.   ? ? ?History of Present Illness:   ? ?Bonnie Zhang is a 81 y.o. 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   ? ?Previously seen with shortness of breath and low heart rate, diagnosed with high degree AV block/Mobitz 2.  Transfered to [facility] with pacemaker was placed successfully on 05/03/2021.  She states her shortness of breath is improved although still present.  Denies edema, feels well otherwise. ? ? ?Prior notes ?Echo 09/2020 EF 60 to 15%, grade 2 diastolic dysfunction. ?Lexiscan 09/2020, no evidence for ischemia. ?Echo 4008 normal systolic function, impaired relaxation, EF 55 to 60% ? ? ?Past Medical History:  ?Diagnosis Date  ? Anticoagulated on Coumadin 2001  ? 2 blood clots  ? Bruises easily   ? pt is on Coumadin;last dose of Coumadin 08/05/11 and then Lovenox started 08/07/11  ? DVT (deep vein thrombosis) in pregnancy   ?  1965 post C section;2001 with prolonged driving while on  HRT  ? DVT (deep venous thrombosis) ([place]) 2001  ? Was on Prempro.    ? 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 Gallstones   ? GERD (gastroesophageal reflux disease)   ? Protonix prn  ? High cholesterol   ? takes Simvastating daily  ? History of colon polyps 2007  ? Dr Sharlett Iles  ? History of radiation therapy 09/06/2019-11/23/2019  ? IMRT Vagina/Pelvis and Vagina HDR 4 fx; Dr. Gery Pray  ? Homocysteinemia 01/23/2015  ? Hx of migraines   ? yrs ago   ? Hypertension   ? takes Benazepril,Amlodipine,and Metoprolol daily  ? Osteoporosis   ? PONV  (postoperative nausea and vomiting)   ? Primary localized osteoarthritis of left knee   ? PVD (peripheral vascular disease) ([place]) 1965, 2001  ? abnormal venous doppler findings due to recurrent DVT'S left leg  ? Right knee DJD   ? knees  ? Shortness of breath dyspnea   ? TIA (transient ischemic attack) 2020  ? Vitamin B12 deficiency (non anemic) 01/24/2015  ? Vitamin D deficiency   ? takes VIt D daily  ? ? ?Past Surgical History:  ?Procedure Laterality Date  ? ABDOMINAL HYSTERECTOMY    ? APPENDECTOMY  04/01/1963  ? BREAST LUMPECTOMY  03/31/1985  ? CESAREAN SECTION  1961/1965/1966  ? X 3  ? CHOLECYSTECTOMY  04/01/1995  ? COLONOSCOPY W/ POLYPECTOMY  03/31/2005  ? Dr  Sharlett Iles; due? 2012  ? DILATION AND CURETTAGE OF UTERUS  03/31/2010  ? uterine polyp, Dr Kennon Rounds  ? ESOPHAGOGASTRODUODENOSCOPY  04/01/1995  ? HYSTEROSCOPY WITH D & C N/A 08/05/2012  ? Procedure: DILATATION AND CURETTAGE /HYSTEROSCOPY;  Surgeon: Donnamae Jude, MD;  Location: [facility];  Service: Gynecology;  Laterality: N/A;  ? PACEMAKER IMPLANT N/A 05/03/2021  ? Procedure: PACEMAKER IMPLANT;  Surgeon: Evans Lance, MD;  Location: [facility];  Service: Cardiovascular;  Laterality: N/A;  ? ROBOTIC ASSISTED TOTAL HYSTERECTOMY WITH BILATERAL SALPINGO OOPHERECTOMY Right 09/14/2012  ? Procedure: ROBOTIC ASSISTED  TOTAL HYSTERECTOMY WITH BILATERAL SALPINGO OOPHORECTOMY ,right pelvic LYMPHNODE dissection;  Surgeon: Imagene Gurney A. Alycia Rossetti, MD;  Location: [facility];  Service: Gynecology;  Laterality: Right;  ? TOTAL KNEE ARTHROPLASTY  08/11/2011  ? Procedure: TOTAL KNEE ARTHROPLASTY;  Surgeon: Lorn Junes, MD;  Location: [facility];  Service: Orthopedics;  Laterality: Right;  DR Langford THIS CASE  ? TOTAL KNEE ARTHROPLASTY Left 09/11/2014  ? Procedure: TOTAL KNEE ARTHROPLASTY;  Surgeon: Elsie Saas, MD;  Location: [facility];  Service: Orthopedics;  Laterality: Left;  ? WISDOM TOOTH EXTRACTION    ? ? ?Current Medications: ?Current Meds  ?Medication Sig  ?  acetaminophen (TYLENOL) 500 MG tablet Take 500-1,000 mg by mouth every 6 (six) hours as needed for mild pain.  ? amLODipine (NORVASC) 10 MG tablet Take 1 tablet (10 mg total) by mouth daily.  ? benazepril (LOTENSIN) 40 MG tablet TAKE 1 TABLET BY MOUTH DAILY.  ? cholecalciferol (VITAMIN D3) 25 MCG (1000 UNIT) tablet Take 2,000 Units by mouth daily.  ? pantoprazole (PROTONIX) 40 MG tablet TAKE 1 TABLET BY MOUTH ONCE DAILY AS NEEDED FOR ACID REFLUX  ? simvastatin (ZOCOR) 10 MG tablet TAKE 1 TABLET BY MOUTH AT BEDTIME.  ? spironolactone (ALDACTONE) 25 MG tablet Take 1 tablet (25 mg total) by mouth daily.  ? vitamin B-12 (CYANOCOBALAMIN) 1000 MCG tablet Take 1,000 mcg by mouth daily.  ? warfarin (COUMADIN) 5 MG tablet Take 1/2 tablet daily except take 1 tablet on Mon and Thurs or take as directed by anticoagulation clinic  ?  ? ?Allergies:   Vicodin [hydrocodone-acetaminophen], Aspirin, Hctz [hydrochlorothiazide], and Lipitor [atorvastatin]  ? ?Social History  ? ?Socioeconomic History  ? Marital status: Divorced  ?  Spouse name: Not on file  ? Number of children: 2  ? Years of education: Not on file  ? Highest education level: High school graduate  ?Occupational History  ? Occupation: Retired  ?Tobacco Use  ? Smoking status: Never  ? Smokeless tobacco: Never  ?Vaping Use  ? Vaping Use: Never used  ?Substance and Sexual Activity  ? Alcohol use: No  ? Drug use: No  ? Sexual activity: Not Currently  ?  Birth control/protection: Post-menopausal  ?  Comment: retired. has 2 daughters  ?Other Topics Concern  ? Not on file  ?Social History Narrative  ? Pt lives alone she has 2 daughter - right handed- drinks tea, soda sometimes - No regular exercise  ? ?Social Determinants of Health  ? ?Financial Resource Strain: Low Risk   ? Difficulty of Paying Living Expenses: Not hard at all  ?Food Insecurity: No Food Insecurity  ? Worried About Charity fundraiser in the Last Year: Never true  ? Ran Out of Food in the Last Year: Never true   ?Transportation Needs: No Transportation Needs  ? Lack of Transportation (Medical): No  ? Lack of Transportation (Non-Medical): No  ?Physical Activity: Sufficiently Active  ? Days of Exercise per Week: 5 days  ? Minutes of Exercise per Session: 30 min  ?Stress: No Stress Concern Present  ? Feeling of Stress : Not at all  ?Social Connections: Moderately Integrated  ? Frequency of Communication with Friends and Family: More than three times a week  ? Frequency of Social Gatherings with Friends and Family: More than three times a week  ? Attends Religious Services: More than 4 times per year  ? Active Member of Clubs or Organizations: Yes  ? Attends Archivist Meetings: More  than 4 times per year  ? Marital Status: Widowed  ?  ? ?Family History: ?The patient's family history includes Colon cancer in her maternal grandmother; Deep vein thrombosis in her mother; Diabetes in an other family member; Heart failure in her maternal grandfather; Hypertension in her mother and sister; Kidney disease in her mother; Leukemia in her father; Liver disease in her brother. There is no history of Anesthesia problems, Hypotension, Malignant hyperthermia, Pseudochol deficiency, Stroke, Heart disease, Breast cancer, Ovarian cancer, Uterine cancer, Pancreatic cancer, or Cancer - Prostate. ? ?ROS:   ?Please see the history of present illness.    ? All other systems reviewed and are negative. ? ?EKGs/Labs/Other Studies Reviewed:   ? ?The following studies were reviewed today: ? ? ?EKG:  EKG is ordered today.  EKG shows a sensed V paced rhythm ? ? ?Recent Labs: ?05/03/2021: ALT 13; BUN 14; Creatinine, Ser 1.60; Hemoglobin 13.5; Magnesium 1.8; Platelets 189; Potassium 4.1; Sodium 139  ?Recent Lipid Panel ?   ?Component Value Date/Time  ? CHOL 151 03/05/2021 1048  ? CHOL 178 05/25/2014 1121  ? TRIG 122.0 03/05/2021 1048  ? TRIG 138 05/25/2014 1121  ? HDL 71.20 03/05/2021 1048  ? HDL 76 05/25/2014 1121  ? CHOLHDL 2 03/05/2021 1048  ?  VLDL 24.4 03/05/2021 1048  ? LDLCALC 55 03/05/2021 1048  ? [place] 74 05/25/2014 1121  ? ? ? ?Risk Assessment/Calculations:   ? ? ?    ? ?Physical Exam:   ? ?VS:  BP 136/62 (BP Location: Left Arm, [place]

## 2021-08-05 ENCOUNTER — Ambulatory Visit (INDEPENDENT_AMBULATORY_CARE_PROVIDER_SITE_OTHER): Payer: Medicare Other

## 2021-08-05 DIAGNOSIS — I441 Atrioventricular block, second degree: Secondary | ICD-10-CM | POA: Diagnosis not present

## 2021-08-05 LAB — CUP PACEART REMOTE DEVICE CHECK
Date Time Interrogation Session: 20230508083359
Implantable Lead Implant Date: 20230203
Implantable Lead Implant Date: 20230203
Implantable Lead Location: 753859
Implantable Lead Location: 753860
Implantable Lead Model: 377171
Implantable Lead Model: 377171
Implantable Lead Serial Number: 8000600801
Implantable Lead Serial Number: 8000640919
Implantable Pulse Generator Implant Date: 20230203
Pulse Gen Model: 407145
Pulse Gen Serial Number: 70290684

## 2021-08-06 ENCOUNTER — Ambulatory Visit (INDEPENDENT_AMBULATORY_CARE_PROVIDER_SITE_OTHER): Payer: Medicare Other

## 2021-08-06 DIAGNOSIS — Z7901 Long term (current) use of anticoagulants: Secondary | ICD-10-CM | POA: Diagnosis not present

## 2021-08-06 LAB — POCT INR: INR: 1.8 — AB (ref 2.0–3.0)

## 2021-08-06 NOTE — Progress Notes (Signed)
Increase dose today to take 1 tablet and then continue 1/2 tablet daily except 1 tablet on Mondays and Thursdays.  Re-check in to 3 weeks.   ?

## 2021-08-06 NOTE — Patient Instructions (Addendum)
Pre visit review using our clinic review tool, if applicable. No additional management support is needed unless otherwise documented below in the visit note. ? ?Increase dose today to take 1 tablet and then continue 1/2 tablet daily except 1 tablet on Mondays and Thursdays.  Re-check in to 3 weeks.  ?

## 2021-08-09 ENCOUNTER — Encounter: Payer: Self-pay | Admitting: Gynecologic Oncology

## 2021-08-12 ENCOUNTER — Telehealth: Payer: Self-pay

## 2021-08-12 DIAGNOSIS — Z7901 Long term (current) use of anticoagulants: Secondary | ICD-10-CM

## 2021-08-12 MED ORDER — WARFARIN SODIUM 5 MG PO TABS: ORAL_TABLET | ORAL | 1 refills | Status: DC

## 2021-08-12 NOTE — Telephone Encounter (Signed)
Pt LVM requesting refill of warfarin. ?Pt is compliant with warfarin management and PCP apts.  ?Sent in refill. ?

## 2021-08-16 ENCOUNTER — Encounter: Payer: Medicare Other | Admitting: Internal Medicine

## 2021-08-19 ENCOUNTER — Encounter: Payer: Self-pay | Admitting: Gynecologic Oncology

## 2021-08-19 ENCOUNTER — Inpatient Hospital Stay: Payer: Medicare Other | Attending: Gynecologic Oncology | Admitting: Gynecologic Oncology

## 2021-08-19 ENCOUNTER — Other Ambulatory Visit: Payer: Self-pay

## 2021-08-19 VITALS — BP 136/60 | HR 74 | Temp 98.1°F | Resp 18 | Ht 60.0 in | Wt 166.0 lb

## 2021-08-19 DIAGNOSIS — Z923 Personal history of irradiation: Secondary | ICD-10-CM | POA: Diagnosis not present

## 2021-08-19 DIAGNOSIS — Z90710 Acquired absence of both cervix and uterus: Secondary | ICD-10-CM | POA: Diagnosis not present

## 2021-08-19 DIAGNOSIS — R350 Frequency of micturition: Secondary | ICD-10-CM | POA: Insufficient documentation

## 2021-08-19 DIAGNOSIS — Z90722 Acquired absence of ovaries, bilateral: Secondary | ICD-10-CM | POA: Insufficient documentation

## 2021-08-19 DIAGNOSIS — Z8542 Personal history of malignant neoplasm of other parts of uterus: Secondary | ICD-10-CM | POA: Insufficient documentation

## 2021-08-19 DIAGNOSIS — C541 Malignant neoplasm of endometrium: Secondary | ICD-10-CM

## 2021-08-19 NOTE — Patient Instructions (Signed)
It was good to see you today.  I do not see or feel any evidence of cancer recurrence on your exam.  Please keep using your dilator.  I will see you back for follow-up in 6 months.  Please call sometime in August or September to get a visit scheduled with me in November.  If you develop any concerning symptoms before that, please call to see me sooner.

## 2021-08-19 NOTE — Progress Notes (Signed)
Gynecologic Oncology Return Clinic Visit  08/19/21  Reason for Visit: surveillance visit in the setting of endometrial cancer  Treatment History: Oncology History Overview Note  MMR IHC intact ER/PR strongly positive   Endometrial cancer ([place])  08/09/2019 Initial Biopsy   Vaginal biopsy: Endometrioid adenoca, FIGO gr 2   08/19/2019 Initial Diagnosis   Endometrial cancer ([place])   09/06/2019 - 11/23/2019 Radiation Therapy   Site Technique Total Dose (Gy) Dose per Fx (Gy) Completed Fx Beam Energies  Vagina: Pelvis_Bst HDR-brachy 24/24 6 4/4 Ir-192  Vagina: Pelvis IMRT 45/45 1.8 25/25 6X       2012: Endometrial sampling showed benign endometrium, polyps 08/05/2012: D&C and hysteroscopy, pathology revealed at least atypical complex endometrial hyperplasia with a focal area worrisome for well differentiated endometrioid carcinoma 09/14/2012: Total robotic hysterectomy, BSO, right pelvic lymph node dissection.  Frozen section with no evidence of cancer, possible hyperplasia.  Final pathology revealed extensive atypical complex hyperplasia.  4 right lymph nodes negative for malignancy. 08/09/2019: Patient presented to her OB/GYN with an episode of pink discharge followed by frank bleeding after using a douche.  Since that time has had continued brown discharge. 08/09/2019: Vaginal biopsy shows endometrioid adenocarcinoma  Interval History: Patient reports overall doing well since her last visit with me.  She had a pacemaker put in.  Goes for follow-up later this week.  She continues to have very minimal intermittent pink spotting with her dilator use.  She is using the dilator every other day.  Does not have spotting every time she uses the dilator.  Denies any spotting or vaginal bleeding outside of dilator use.  Denies any pelvic pain.  Reports regular bowel function.  Continues to have urinary frequency.  Facial redness and flushing much improved.  Still has symptoms sometimes but last only for a  few minutes.  Past Medical/Surgical History: Past Medical History:  Diagnosis Date   Anticoagulated on Coumadin 2001   2 blood clots   Bruises easily    pt is on Coumadin;last dose of Coumadin 08/05/11 and then Lovenox started 08/07/11   DVT (deep vein thrombosis) in pregnancy     1965 post C section;2001 with prolonged driving while on  HRT   DVT (deep venous thrombosis) ([place]) 2001   Was on Prempro.     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 Gery Pray   Homocysteinemia 01/23/2015   Hx of migraines    yrs ago    Hypertension    takes Benazepril,Amlodipine,and Metoprolol daily   Osteoporosis    PONV (postoperative nausea and vomiting)    Primary localized osteoarthritis of left knee    PVD (peripheral vascular disease) ([place]) 1965, 2001   abnormal venous doppler findings due to recurrent DVT'S left leg   Right knee DJD    knees   Shortness of breath dyspnea    TIA (transient ischemic attack) 2020   Vitamin B12 deficiency (non anemic) 01/24/2015   Vitamin D deficiency    takes VIt D daily    Past Surgical History:  Procedure Laterality Date   ABDOMINAL HYSTERECTOMY     APPENDECTOMY  04/01/1963   BREAST LUMPECTOMY  03/31/1985   CESAREAN SECTION  1961/1965/1966  X 3   CHOLECYSTECTOMY  04/01/1995   COLONOSCOPY W/ POLYPECTOMY  03/31/2005   Dr  Sharlett Iles; due? 2012   [place]  03/31/2010   uterine polyp, Dr Kennon Rounds   ESOPHAGOGASTRODUODENOSCOPY  04/01/1995   HYSTEROSCOPY WITH D & C N/A 08/05/2012   Procedure: DILATATION AND CURETTAGE /HYSTEROSCOPY;  Surgeon: Donnamae Jude, MD;  Location: [facility];  Service: Gynecology;  Laterality: N/A;   PACEMAKER IMPLANT N/A 05/03/2021   Procedure:  PACEMAKER IMPLANT;  Surgeon: Evans Lance, MD;  Location: [facility];  Service: Cardiovascular;  Laterality: N/A;   ROBOTIC ASSISTED TOTAL HYSTERECTOMY WITH BILATERAL SALPINGO OOPHERECTOMY Right 09/14/2012   Procedure: ROBOTIC ASSISTED TOTAL HYSTERECTOMY WITH BILATERAL SALPINGO OOPHORECTOMY ,right pelvic LYMPHNODE dissection;  Surgeon: Imagene Gurney A. Alycia Rossetti, MD;  Location: [facility];  Service: Gynecology;  Laterality: Right;   TOTAL KNEE ARTHROPLASTY  08/11/2011   Procedure: TOTAL KNEE ARTHROPLASTY;  Surgeon: Lorn Junes, MD;  Location: [facility];  Service: Orthopedics;  Laterality: Right;  DR Ephriam Knuckles 90 MINUTES FOR THIS CASE   TOTAL KNEE ARTHROPLASTY Left 09/11/2014   Procedure: TOTAL KNEE ARTHROPLASTY;  Surgeon: Elsie Saas, MD;  Location: [facility];  Service: Orthopedics;  Laterality: Left;   WISDOM TOOTH EXTRACTION      Family History  Problem Relation Age of Onset   Kidney disease Mother        ? hypertensive   Hypertension Mother    Deep vein thrombosis Mother        post ankle fracture   Leukemia Father    Hypertension Sister    Liver disease Brother    Colon cancer Maternal Grandmother    Heart failure Maternal Grandfather    Diabetes Other        cousins   Anesthesia problems Neg Hx    Hypotension Neg Hx    Malignant hyperthermia Neg Hx    Pseudochol deficiency Neg Hx    Stroke Neg Hx    Heart disease Neg Hx    Breast cancer Neg Hx    Ovarian cancer Neg Hx    Uterine cancer Neg Hx    Pancreatic cancer Neg Hx    Cancer - Prostate Neg Hx     Social History   Socioeconomic History   Marital status: Divorced    Spouse name: Not on file   Number of children: 2   Years of education: Not on file   Highest education level: High school graduate  Occupational History   Occupation: Retired  Tobacco Use   Smoking status: Never   Smokeless tobacco: Never  Vaping Use   Vaping Use: Never used  Substance and Sexual Activity   Alcohol use: No   Drug use: No    Sexual activity: Not Currently    Birth control/protection: Post-menopausal    Comment: retired. has 2 daughters  Other Topics Concern   Not on file  Social History Narrative   Pt lives alone she has 2 daughter - right handed- drinks tea, soda sometimes - No regular exercise   Social Determinants of Health   Financial Resource Strain: Low Risk    Difficulty of Paying Living Expenses: Not hard at all  Food Insecurity: No Food Insecurity   Worried About Charity fundraiser in the Last Year: Never true   Ran Out of Food in the Last Year: Never true  Transportation Needs: No Transportation Needs   Lack of Transportation (Medical): No   Lack of Transportation (  Non-Medical): No  Physical Activity: Sufficiently Active   Days of Exercise per Week: 5 days   Minutes of Exercise per Session: 30 min  Stress: No Stress Concern Present   Feeling of Stress : Not at all  Social Connections: Moderately Integrated   Frequency of Communication with Friends and Family: More than three times a week   Frequency of Social Gatherings with Friends and Family: More than three times a week   Attends Religious Services: More than 4 times per year   Active Member of [employer] or Organizations: Yes   Attends Archivist Meetings: More than 4 times per year   Marital Status: Widowed    Current Medications:  Current Outpatient Medications:    acetaminophen (TYLENOL) 500 MG tablet, Take 500-1,000 mg by mouth every 6 (six) hours as needed for mild pain., Disp: , Rfl:    amLODipine (NORVASC) 10 MG tablet, Take 1 tablet (10 mg total) by mouth daily., Disp: 90 tablet, Rfl: 3   benazepril (LOTENSIN) 40 MG tablet, TAKE 1 TABLET BY MOUTH DAILY., Disp: 90 tablet, Rfl: 1   cholecalciferol (VITAMIN D3) 25 MCG (1000 UNIT) tablet, Take 2,000 Units by mouth daily., Disp: , Rfl:    pantoprazole (PROTONIX) 40 MG tablet, TAKE 1 TABLET BY MOUTH ONCE DAILY AS NEEDED FOR ACID REFLUX, Disp: 90 tablet, Rfl: 0   simvastatin  (ZOCOR) 10 MG tablet, TAKE 1 TABLET BY MOUTH AT BEDTIME., Disp: 90 tablet, Rfl: 1   spironolactone (ALDACTONE) 25 MG tablet, Take 1 tablet (25 mg total) by mouth daily., Disp: 90 tablet, Rfl: 3   vitamin B-12 (CYANOCOBALAMIN) 1000 MCG tablet, Take 1,000 mcg by mouth daily., Disp: , Rfl:    warfarin (COUMADIN) 5 MG tablet, Take 1/2 tablet daily except take 1 tablet on Mon and Thurs or take as directed by anticoagulation clinic, Disp: 90 tablet, Rfl: 1  Review of Systems: Denies appetite changes, fevers, chills, fatigue, unexplained weight changes. Denies hearing loss, neck lumps or masses, mouth sores, ringing in ears or voice changes. Denies cough or wheezing.  Denies shortness of breath. Denies chest pain or palpitations. Denies leg swelling. Denies abdominal distention, pain, blood in stools, constipation, diarrhea, nausea, vomiting, or early satiety. Denies pain with intercourse, dysuria, hematuria or incontinence. Denies hot flashes, pelvic pain, vaginal bleeding or vaginal discharge.   Denies joint pain, back pain or muscle pain/cramps. Denies itching, rash, or wounds. Denies dizziness, headaches, numbness or seizures. Denies swollen lymph nodes or glands, denies easy bruising or bleeding. Denies anxiety, depression, confusion, or decreased concentration.  Physical Exam: BP 136/60 (BP Location: Right Arm, Patient Position: Sitting)   Pulse 74   Temp 98.1 F (36.7 C) (Oral)   Resp 18   Ht 5' (1.524 m)   Wt 166 lb (75.3 kg)   SpO2 99%   BMI 32.42 kg/m  General: Alert, oriented, no acute distress. HEENT: Normocephalic, atraumatic, sclera anicteric. Chest: Unlabored breathing on room air. Abdomen: Obese, soft, nontender.  Normoactive bowel sounds.  No masses or hepatosplenomegaly appreciated.  Well-healed scar. Extremities: Grossly normal range of motion.  Warm, well perfused.  Trace edema bilaterally. Skin: No rashes or lesions noted. Lymphatics: No cervical, supraclavicular, or  inguinal adenopathy. GU: Normal appearing external genitalia without erythema, excoriation, or lesions.  Speculum exam reveals narrowed vagina, difficult to see with the speculum past approximately 3 cm.  No bleeding or blood noted within the vagina.  Bimanual exam reveals vagina is smooth, no nodularity appreciated.  Rectovaginal exam confirms  these findings.  Laboratory & Radiologic Studies: None new  03/07/21 CT A/P: IMPRESSION: 1. No evidence of disease recurrence or metastatic disease in the abdomen or pelvis. 2. Small hiatal hernia. Questionable wall thickening about the gastric cardia. This may be related to peptic ulcer disease, however consider endoscopy for further assessment based on clinical concern. 3. Colonic diverticulosis without diverticulitis. 4. Stable benign left adrenal myelolipoma.  Assessment & Plan: ANNASTASIA HASKINS is a 81 y.o. woman with grade 2 endometrioid adenocarcinoma presenting at the vaginal cuff after prior hysterectomy for complex atypical hyperplasia who who completed definitive radiation treatment in August 2021.   The patient continues to do very well.  She is NED on exam today.    We reviewed again that given the narrowing at the top of the vagina, continued dilator use is very important.  She has been diligent about doing this.  She continues to have intermittent very light spotting, only with dilator use.  I do not see anything on exam today or feel anything concerning for cancer recurrence.  I suspect that this is all related to radiation changes and atrophy.  Of asked her to call if this becomes any more frequent or heavier.   Per NCCN surveillance recommendations, I recommended surveillance visits every 3 months for the first 2-3 years and then every 6 months until 5 years.  We will alternate visits between my clinic and radiation oncology.  She will see Dr. Sondra Come in 3 months and present back for a visit with me in 6 months. She knows to call the  clinic if she develops any concerning symptoms before her next scheduled visit.  22 minutes of total time was spent for this patient encounter, including preparation, face-to-face counseling with the patient and coordination of care, and documentation of the encounter.  Jeral Pinch, MD  Division of Gynecologic Oncology  Department of Obstetrics and Gynecology  [facility]

## 2021-08-20 NOTE — Progress Notes (Signed)
Remote ICD transmission.   

## 2021-08-21 ENCOUNTER — Encounter: Payer: Self-pay | Admitting: Cardiology

## 2021-08-21 ENCOUNTER — Ambulatory Visit (INDEPENDENT_AMBULATORY_CARE_PROVIDER_SITE_OTHER): Payer: Medicare Other | Admitting: Cardiology

## 2021-08-21 VITALS — BP 132/44 | HR 71 | Ht 60.0 in | Wt 169.0 lb

## 2021-08-21 DIAGNOSIS — Z950 Presence of cardiac pacemaker: Secondary | ICD-10-CM

## 2021-08-21 DIAGNOSIS — R001 Bradycardia, unspecified: Secondary | ICD-10-CM

## 2021-08-21 DIAGNOSIS — I441 Atrioventricular block, second degree: Secondary | ICD-10-CM | POA: Diagnosis not present

## 2021-08-21 LAB — PACEMAKER DEVICE OBSERVATION

## 2021-08-21 NOTE — Progress Notes (Signed)
Electrophysiology Office Follow up Visit Note:    Date:  08/21/2021   ID:  Bonnie Zhang, DOB Apr 22, 1940, MRN [id number]  PCP:  [name], MD  [facility] Cardiologist:  Kate Sable, MD  [facility] Electrophysiologist:  Vickie Epley, MD    Interval History:    Bonnie Zhang is a 81 y.o. female who presents for a follow up visit after a dual-chamber permanent pacemaker was implanted May 03, 2021 by Dr. Lovena Le.  She has done well since implant.  Remote interrogation showed stable lead function.       Past Medical History:  Diagnosis Date   Anticoagulated on Coumadin 2001   2 blood clots   Bruises easily    pt is on Coumadin;last dose of Coumadin 08/05/11 and then Lovenox started 08/07/11   DVT (deep vein thrombosis) in pregnancy     1965 post C section;2001 with prolonged driving while on  HRT   DVT (deep venous thrombosis) ([place]) 2001   Was on Prempro.     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 Gery Pray   Homocysteinemia 01/23/2015   Hx of migraines    yrs ago    Hypertension    takes Benazepril,Amlodipine,and Metoprolol daily   Osteoporosis    PONV (postoperative nausea and vomiting)    Primary localized osteoarthritis of left knee    PVD (peripheral vascular disease) ([place]) 1965, 2001   abnormal venous doppler findings due to recurrent DVT'S left leg   Right knee DJD    knees   Shortness of breath dyspnea    TIA (transient ischemic attack) 2020   Vitamin B12 deficiency (non anemic) 01/24/2015   Vitamin D deficiency    takes VIt D daily    Past Surgical History:  Procedure Laterality Date   ABDOMINAL HYSTERECTOMY     APPENDECTOMY  04/01/1963   BREAST  LUMPECTOMY  03/31/1985   CESAREAN SECTION  1961/1965/1966   X 3   CHOLECYSTECTOMY  04/01/1995   COLONOSCOPY W/ POLYPECTOMY  03/31/2005   Dr  Sharlett Iles; due? 2012   [place]  03/31/2010   uterine polyp, Dr Kennon Rounds   ESOPHAGOGASTRODUODENOSCOPY  04/01/1995   HYSTEROSCOPY WITH D & C N/A 08/05/2012   Procedure: DILATATION AND CURETTAGE /HYSTEROSCOPY;  Surgeon: Donnamae Jude, MD;  Location: [facility];  Service: Gynecology;  Laterality: N/A;   PACEMAKER IMPLANT N/A 05/03/2021   Procedure: PACEMAKER IMPLANT;  Surgeon: Evans Lance, MD;  Location: [place] [facility];  Service: Cardiovascular;  Laterality: N/A;   ROBOTIC ASSISTED TOTAL HYSTERECTOMY WITH BILATERAL SALPINGO OOPHERECTOMY Right 09/14/2012   Procedure: ROBOTIC ASSISTED TOTAL HYSTERECTOMY WITH BILATERAL SALPINGO OOPHORECTOMY ,right pelvic LYMPHNODE dissection;  Surgeon: Imagene Gurney A. Alycia Rossetti, MD;  Location: [facility];  Service: Gynecology;  Laterality: Right;   TOTAL KNEE ARTHROPLASTY  08/11/2011   Procedure: TOTAL KNEE ARTHROPLASTY;  Surgeon: Lorn Junes, MD;  Location: [place];  Service: Orthopedics;  Laterality: Right;  DR Ephriam Knuckles 90 MINUTES FOR THIS CASE   TOTAL KNEE ARTHROPLASTY Left 09/11/2014   Procedure: TOTAL KNEE ARTHROPLASTY;  Surgeon: Elsie Saas, MD;  Location: [place];  Service: Orthopedics;  Laterality: Left;  WISDOM TOOTH EXTRACTION      Current Medications: Current Meds  Medication Sig   acetaminophen (TYLENOL) 500 MG tablet Take 500-1,000 mg by mouth every 6 (six) hours as needed for mild pain.   amLODipine (NORVASC) 10 MG tablet Take 1 tablet (10 mg total) by mouth daily.   benazepril (LOTENSIN) 40 MG tablet TAKE 1 TABLET BY MOUTH DAILY.   cholecalciferol (VITAMIN D3) 25 MCG (1000 UNIT) tablet Take 2,000 Units by mouth daily.   pantoprazole (PROTONIX) 40 MG tablet TAKE 1 TABLET BY MOUTH ONCE DAILY AS NEEDED FOR ACID REFLUX   simvastatin (ZOCOR) 10 MG tablet TAKE 1 TABLET BY MOUTH AT BEDTIME.    spironolactone (ALDACTONE) 25 MG tablet Take 1 tablet (25 mg total) by mouth daily.   vitamin B-12 (CYANOCOBALAMIN) 1000 MCG tablet Take 1,000 mcg by mouth daily.   warfarin (COUMADIN) 5 MG tablet Take 1/2 tablet daily except take 1 tablet on Mon and Thurs or take as directed by anticoagulation clinic     Allergies:   Vicodin [hydrocodone-acetaminophen], Aspirin, Hctz [hydrochlorothiazide], and Lipitor [atorvastatin]   Social History   Socioeconomic History   Marital status: Divorced    Spouse name: Not on file   Number of children: 2   Years of education: Not on file   Highest education level: High school graduate  Occupational History   Occupation: Retired  Tobacco Use   Smoking status: Never   Smokeless tobacco: Never  Vaping Use   Vaping Use: Never used  Substance and Sexual Activity   Alcohol use: No   Drug use: No   Sexual activity: Not Currently    Birth control/protection: Post-menopausal    Comment: retired. has 2 daughters  Other Topics Concern   Not on file  Social History Narrative   Pt lives alone she has 2 daughter - right handed- drinks tea, soda sometimes - No regular exercise   Social Determinants of Health   Financial Resource Strain: Low Risk    Difficulty of Paying Living Expenses: Not hard at all  Food Insecurity: No Food Insecurity   Worried About Charity fundraiser in the Last Year: Never true   Ran Out of Food in the Last Year: Never true  Transportation Needs: No Transportation Needs   Lack of Transportation (Medical): No   Lack of Transportation (Non-Medical): No  Physical Activity: Sufficiently Active   Days of Exercise per Week: 5 days   Minutes of Exercise per Session: 30 min  Stress: No Stress Concern Present   Feeling of Stress : Not at all  Social Connections: Moderately Integrated   Frequency of Communication with Friends and Family: More than three times a week   Frequency of Social Gatherings with Friends and Family: More than  three times a week   Attends Religious Services: More than 4 times per year   Active Member of [employer] or Organizations: Yes   Attends Archivist Meetings: More than 4 times per year   Marital Status: Widowed     Family History: The patient's family history includes Colon cancer in her maternal grandmother; Deep vein thrombosis in her mother; Diabetes in an other family member; Heart failure in her maternal grandfather; Hypertension in her mother and sister; Kidney disease in her mother; Leukemia in her father; Liver disease in her brother. There is no history of Anesthesia problems, Hypotension, Malignant hyperthermia, Pseudochol deficiency, Stroke, Heart disease, Breast cancer, Ovarian cancer, Uterine cancer, Pancreatic cancer, or Cancer - Prostate.  ROS:  Please see the history of present illness.    All other systems reviewed and are negative.  EKGs/Labs/Other Studies Reviewed:    The following studies were reviewed today:   Aug 21, 2021 in clinic device interrogation personally reviewed Lead parameters stable. Changed lead outputs to maximize battery longevity 99% ventricular pacing 40% atrial pacing r  EKG:  The ekg ordered today demonstrates atrial sensed, ventricular paced rhythm  Recent Labs: 05/03/2021: ALT 13; BUN 14; Creatinine, Ser 1.60; Hemoglobin 13.5; Magnesium 1.8; Platelets 189; Potassium 4.1; Sodium 139  Recent Lipid Panel    Component Value Date/Time   CHOL 151 03/05/2021 1048   CHOL 178 05/25/2014 1121   TRIG 122.0 03/05/2021 1048   TRIG 138 05/25/2014 1121   HDL 71.20 03/05/2021 1048   HDL 76 05/25/2014 1121   CHOLHDL 2 03/05/2021 1048   VLDL 24.4 03/05/2021 1048   LDLCALC 55 03/05/2021 1048   LDLCALC 74 05/25/2014 1121    Physical Exam:    VS:  BP (!) 132/44   Pulse 71   Ht 5' (1.524 m)   Wt 169 lb (76.7 kg)   SpO2 97%   BMI 33.01 kg/m     Wt Readings from Last 3 Encounters:  08/21/21 169 lb (76.7 kg)  08/19/21 166 lb (75.3 kg)   08/02/21 167 lb (75.8 kg)     GEN:  Well nourished, well developed in no acute distress HEENT: Normal NECK: No JVD; No carotid bruits LYMPHATICS: No lymphadenopathy CARDIAC: RRR, no murmurs, rubs, gallops.  Pacemaker pocket well-healed RESPIRATORY:  Clear to auscultation without rales, wheezing or rhonchi  ABDOMEN: Soft, non-tender, non-distended MUSCULOSKELETAL:  No edema; No deformity  SKIN: Warm and dry NEUROLOGIC:  Alert and oriented x 3 PSYCHIATRIC:  Normal affect        ASSESSMENT:    1. AV block, Mobitz 2   2. Symptomatic bradycardia   3. Cardiac pacemaker in situ    PLAN:    In order of problems listed above:  #Mobitz 2 AV block #Symptomatic bradycardia #Pacemaker in situ Device functioning appropriately.  Continue remote monitoring.  Follow-up 1 year or sooner as needed.   Medication Adjustments/Labs and Tests Ordered: Current medicines are reviewed at length with the patient today.  Concerns regarding medicines are outlined above.  Orders Placed This Encounter  Procedures   EKG 12-Lead   No orders of the defined types were placed in this encounter.    Signed, Lars Mage, MD, [facility], [facility] 08/21/2021 9:59 AM    Electrophysiology [facility]

## 2021-08-21 NOTE — Patient Instructions (Addendum)
Medications: Your physician recommends that you continue on your current medications as directed. Please refer to the Current Medication list given to you today. *If you need a refill on your cardiac medications before your next appointment, please call your pharmacy*  Lab Work: None. If you have labs (blood work) drawn today and your tests are completely normal, you will receive your results only by: MyChart Message (if you have MyChart) OR A paper copy in the mail If you have any lab test that is abnormal or we need to change your treatment, we will call you to review the results.  Testing/Procedures: None.  Follow-Up: At [facility], you and your health needs are our priority.  As part of our continuing mission to provide you with exceptional heart care, we have created designated Provider Care Teams.  These Care Teams include your primary Cardiologist (physician) and Advanced Practice Providers (APPs -  Physician Assistants and Nurse Practitioners) who all work together to provide you with the care you need, when you need it.  Your physician wants you to follow-up in: 12 months with Cameron Lambert, MD     You will receive a reminder letter in the mail two months in advance. If you don't receive a letter, please call our office to schedule the follow-up appointment.  We recommend signing up for the patient portal called "MyChart".  Sign up information is provided on this After Visit Summary.  MyChart is used to connect with patients for Virtual Visits (Telemedicine).  Patients are able to view lab/test results, encounter notes, upcoming appointments, etc.  Non-urgent messages can be sent to your provider as well.   To learn more about what you can do with MyChart, go to [email or web address].com.    Any Other Special Instructions Will Be Listed Below (If Applicable).  

## 2021-08-29 ENCOUNTER — Telehealth: Payer: Self-pay

## 2021-08-29 NOTE — Chronic Care Management (AMB) (Signed)
Chronic Care Management Pharmacy Assistant   Name: Bonnie Zhang  MRN: [id number] DOB: [birth year]    Reason for Encounter: Disease State-General    Recent office visits:  05/15/21 [name], MD-PCP (Ear pain) No orders or Medication changes  Recent consult visits:  08/21/21 Lars Mage T, MD-Cardiology (AV block, Mobitz 2) EKG ordered, Medication changes: none  08/19/21 Lafonda Mosses, MD-Gynecologic Oncology (Endometrial cancer) No orders or medication changes  08/02/21 Kate Sable, MD-Cardiology (AV block, Mobitz 2) Orders: EKG, Echocardiogram complete; Medication changes: none  Hospital visits:  Medication Reconciliation was completed by comparing discharge summary, patient's EMR and Pharmacy list, and upon discussion with patient.  Admitted to the hospital on 05/03/21 due to AV Block. Discharge date was 05/04/21. Discharged from [facility].     Medications that remain the same after Hospital Discharge:??  -All other medications will remain the same.    Medications: Outpatient Encounter Medications as of 08/29/2021  Medication Sig   acetaminophen (TYLENOL) 500 MG tablet Take 500-1,000 mg by mouth every 6 (six) hours as needed for mild pain.   amLODipine (NORVASC) 10 MG tablet Take 1 tablet (10 mg total) by mouth daily.   benazepril (LOTENSIN) 40 MG tablet TAKE 1 TABLET BY MOUTH DAILY.   cholecalciferol (VITAMIN D3) 25 MCG (1000 UNIT) tablet Take 2,000 Units by mouth daily.   pantoprazole (PROTONIX) 40 MG tablet TAKE 1 TABLET BY MOUTH ONCE DAILY AS NEEDED FOR ACID REFLUX   simvastatin (ZOCOR) 10 MG tablet TAKE 1 TABLET BY MOUTH AT BEDTIME.   spironolactone (ALDACTONE) 25 MG tablet Take 1 tablet (25 mg total) by mouth daily.   vitamin B-12 (CYANOCOBALAMIN) 1000 MCG tablet Take 1,000 mcg by mouth daily.   warfarin (COUMADIN) 5 MG tablet Take 1/2 tablet daily except take 1 tablet on Mon and Thurs or take as directed by anticoagulation  clinic   No facility-administered encounter medications on file as of 08/29/2021.   [place] [facility] for General Review Call   Chart Review:  Have there been any documented new, changed, or discontinued medications since last visit? Yes (If yes, include name, dose, frequency, date) Has there been any documented recent hospitalizations or ED visits since last visit with Clinical Pharmacist? Yes Brief Summary (including medication and/or Diagnosis changes):Admitted to the hospital on 05/03/21 due to AV Block. Discharge date was 05/04/21. Discharged from [facility].     Adherence Review:  Does the Clinical Pharmacist Assistant have access to adherence rates? Yes Adherence rates for STAR metric medications (List medication(s)/day supply/ last 2 fill dates). Adherence rates for medications indicated for disease state being reviewed (List medication(s)/day supply/ last 2 fill dates). Does the patient have >5 day gap between last estimated fill dates for any of the above medications or other medication gaps? No Reason for medication gaps.   Disease State Questions:  Able to connect with Patient? Yes  Did patient have any problems with their health recently? No Note problems and Concerns:  Have you had any admissions or emergency room visits or worsening of your condition(s) since last visit? Yes Details of ED visit, hospital visit and/or worsening condition(s):Admitted to the hospital on 05/03/21 due to AV Block. Discharge date was 05/04/21. Discharged from [facility].    Have you had any visits with new specialists or providers since your last visit? Yes Explain:08/21/21 Lars Mage T, MD-Cardiology (AV block, Mobitz 2) 08/02/21 Kate Sable, MD-Cardiology (AV block, Mobitz 2)  Have you had any new health care problem(s) since your last visit? No New problem(s) reported:  Have you run out of any of your medications since you last spoke  with clinical pharmacist? No What caused you to run out of your medications?  Are there any medications you are not taking as prescribed? No What kept you from taking your medications as prescribed?  Are you having any issues or side effects with your medications? No Note of issues or side effects:  Do you have any other health concerns or questions you want to discuss with your Clinical Pharmacist before your next visit? No Note additional concerns and questions from Patient.  Are there any health concerns that you feel we can do a better job addressing? No Note Patient's response.  Are you having any problems with any of the following since the last visit: (select all that apply)  None  Details:  12. Any falls since last visit? No  Details:  13. Any increased or uncontrolled pain since last visit? No  Details:  14. Next visit Type: None scheduled       15. Additional Details? No    Care Gaps: Colonoscopy-01/11/21 Diabetic Foot Exam-NA Mammogram-NA Ophthalmology-NA Dexa Scan - 01/02/21 Annual Well Visit - NA Micro albumin-NA Hemoglobin A1c- 03/05/21  Star Rating Drugs: Simvastatin 10 mg-last fill 08/19/21 90 ds Benazepril 40 mg-last fill 06/27/21 90 ds   Ethelene Hal Clinical Pharmacist Assistant [phone]

## 2021-08-30 ENCOUNTER — Ambulatory Visit (INDEPENDENT_AMBULATORY_CARE_PROVIDER_SITE_OTHER): Payer: Medicare Other

## 2021-08-30 DIAGNOSIS — Z7901 Long term (current) use of anticoagulants: Secondary | ICD-10-CM | POA: Diagnosis not present

## 2021-08-30 LAB — POCT INR: INR: 2.4 (ref 2.0–3.0)

## 2021-08-30 NOTE — Patient Instructions (Addendum)
Pre visit review using our clinic review tool, if applicable. No additional management support is needed unless otherwise documented below in the visit note.  Continue 1/2 tablet daily except 1 tablet on Mondays and Thursdays.  Re-check in to 6 weeks.   

## 2021-08-30 NOTE — Progress Notes (Signed)
Continue 1/2 tablet daily except 1 tablet on Mondays and Thursdays.  Re-check in to 6 weeks.   

## 2021-09-03 ENCOUNTER — Encounter: Payer: Self-pay | Admitting: Internal Medicine

## 2021-09-03 NOTE — Progress Notes (Unsigned)
      Subjective:    Patient ID: Bonnie Zhang, female    DOB: 1941/01/19, 81 y.o.   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 Related to spironolactone  Medications and allergies reviewed with patient and updated if appropriate.  Current Outpatient Medications on File Prior to Visit  Medication Sig Dispense Refill   acetaminophen (TYLENOL) 500 MG tablet Take 500-1,000 mg by mouth every 6 (six) hours as needed for mild pain.     amLODipine (NORVASC) 10 MG tablet Take 1 tablet (10 mg total) by mouth daily. 90 tablet 3   benazepril (LOTENSIN) 40 MG tablet TAKE 1 TABLET BY MOUTH DAILY. 90 tablet 1   cholecalciferol (VITAMIN D3) 25 MCG (1000 UNIT) tablet Take 2,000 Units by mouth daily.     pantoprazole (PROTONIX) 40 MG tablet TAKE 1 TABLET BY MOUTH ONCE DAILY AS NEEDED FOR ACID REFLUX 90 tablet 0   simvastatin (ZOCOR) 10 MG tablet TAKE 1 TABLET BY MOUTH AT BEDTIME. 90 tablet 1   spironolactone (ALDACTONE) 25 MG tablet Take 1 tablet (25 mg total) by mouth daily. 90 tablet 3   vitamin B-12 (CYANOCOBALAMIN) 1000 MCG tablet Take 1,000 mcg by mouth daily.     warfarin (COUMADIN) 5 MG tablet Take 1/2 tablet daily except take 1 tablet on Mon and Thurs or take as directed by anticoagulation clinic 90 tablet 1   No current facility-administered medications on file prior to visit.     Review of Systems     Objective:  There were no vitals filed for this visit. BP Readings from Last 3 Encounters:  08/21/21 (!) 132/44  08/19/21 136/60  08/02/21 136/62   Wt Readings from Last 3 Encounters:  08/21/21 169 lb (76.7 kg)  08/19/21 166 lb (75.3 kg)  08/02/21 167 lb (75.8 kg)   There is no height or weight on file to calculate BMI.    Physical Exam     Lab Results  Component Value Date   WBC 6.4 05/03/2021    HGB 13.5 05/03/2021   HCT 40.1 05/03/2021   PLT 189 05/03/2021   GLUCOSE 108 (H) 05/03/2021   CHOL 151 03/05/2021   TRIG 122.0 03/05/2021   HDL 71.20 03/05/2021   LDLCALC 55 03/05/2021   ALT 13 05/03/2021   AST 20 05/03/2021   NA 139 05/03/2021   K 4.1 05/03/2021   CL 104 05/03/2021   CREATININE 1.60 (H) 05/03/2021   BUN 14 05/03/2021   CO2 24 05/03/2021   TSH 1.48 03/08/2020   INR 2.4 08/30/2021   HGBA1C 5.3 03/05/2021     Assessment & Plan:    See Problem List for Assessment and Plan of chronic medical problems.

## 2021-09-03 NOTE — Patient Instructions (Addendum)
     Blood work was ordered.     Medications changes include :      Your prescription(s) have been sent to your pharmacy.    A referral was ordered for XX.     Someone from that office will call you to schedule an appointment.    Return in about 6 months (around 03/06/2022) for follow up.  

## 2021-09-04 ENCOUNTER — Ambulatory Visit (INDEPENDENT_AMBULATORY_CARE_PROVIDER_SITE_OTHER): Payer: Medicare Other | Admitting: Internal Medicine

## 2021-09-04 VITALS — BP 116/74 | HR 72 | Temp 98.0°F | Ht 60.0 in | Wt 169.0 lb

## 2021-09-04 DIAGNOSIS — R600 Localized edema: Secondary | ICD-10-CM | POA: Diagnosis not present

## 2021-09-04 DIAGNOSIS — D6851 Activated protein C resistance: Secondary | ICD-10-CM | POA: Diagnosis not present

## 2021-09-04 DIAGNOSIS — Z7901 Long term (current) use of anticoagulants: Secondary | ICD-10-CM

## 2021-09-04 DIAGNOSIS — E782 Mixed hyperlipidemia: Secondary | ICD-10-CM

## 2021-09-04 DIAGNOSIS — R739 Hyperglycemia, unspecified: Secondary | ICD-10-CM

## 2021-09-04 DIAGNOSIS — I10 Essential (primary) hypertension: Secondary | ICD-10-CM

## 2021-09-04 DIAGNOSIS — K219 Gastro-esophageal reflux disease without esophagitis: Secondary | ICD-10-CM

## 2021-09-04 LAB — CBC WITH DIFFERENTIAL/PLATELET
Basophils Absolute: 0 10*3/uL (ref 0.0–0.1)
Basophils Relative: 0.4 % (ref 0.0–3.0)
Eosinophils Absolute: 0 10*3/uL (ref 0.0–0.7)
Eosinophils Relative: 0.9 % (ref 0.0–5.0)
HCT: 40.3 % (ref 36.0–46.0)
Hemoglobin: 13.4 g/dL (ref 12.0–15.0)
Lymphocytes Relative: 26.1 % (ref 12.0–46.0)
Lymphs Abs: 1.2 10*3/uL (ref 0.7–4.0)
MCHC: 33.2 g/dL (ref 30.0–36.0)
MCV: 89.3 fl (ref 78.0–100.0)
Monocytes Absolute: 0.4 10*3/uL (ref 0.1–1.0)
Monocytes Relative: 8.6 % (ref 3.0–12.0)
Neutro Abs: 3 10*3/uL (ref 1.4–7.7)
Neutrophils Relative %: 64 % (ref 43.0–77.0)
Platelets: 192 10*3/uL (ref 150.0–400.0)
RBC: 4.51 Mil/uL (ref 3.87–5.11)
RDW: 13.1 % (ref 11.5–15.5)
WBC: 4.7 10*3/uL (ref 4.0–10.5)

## 2021-09-04 LAB — COMPREHENSIVE METABOLIC PANEL
ALT: 9 U/L (ref 0–35)
AST: 17 U/L (ref 0–37)
Albumin: 4.6 g/dL (ref 3.5–5.2)
Alkaline Phosphatase: 91 U/L (ref 39–117)
BUN: 18 mg/dL (ref 6–23)
CO2: 27 mEq/L (ref 19–32)
Calcium: 10.2 mg/dL (ref 8.4–10.5)
Chloride: 103 mEq/L (ref 96–112)
Creatinine, Ser: 1.19 mg/dL (ref 0.40–1.20)
GFR: 43.33 mL/min — ABNORMAL LOW (ref >60.00)
Glucose, Bld: 103 mg/dL — ABNORMAL HIGH (ref 70–99)
Potassium: 4 mEq/L (ref 3.5–5.1)
Sodium: 139 mEq/L (ref 135–145)
Total Bilirubin: 0.7 mg/dL (ref 0.2–1.2)
Total Protein: 8 g/dL (ref 6.0–8.3)

## 2021-09-04 LAB — HEMOGLOBIN A1C: Hgb A1c MFr Bld: 5.4 % (ref 4.6–6.5)

## 2021-09-04 LAB — LIPID PANEL
Cholesterol: 171 mg/dL (ref 0–200)
HDL: 81.6 mg/dL (ref >39.00)
LDL Cholesterol: 74 mg/dL (ref 0–99)
NonHDL: 88.95
Total CHOL/HDL Ratio: 2
Triglycerides: 76 mg/dL (ref 0.0–149.0)
VLDL: 15.2 mg/dL (ref 0.0–40.0)

## 2021-09-04 NOTE — Assessment & Plan Note (Signed)
Chronic Check a1c Low sugar / carb diet Stressed regular exercise  

## 2021-09-04 NOTE — Assessment & Plan Note (Signed)
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

## 2021-09-04 NOTE — Assessment & Plan Note (Signed)
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

## 2021-09-04 NOTE — Assessment & Plan Note (Addendum)
Chronic GERD controlled Continue pantoprazole 40 mg daily prn  

## 2021-09-04 NOTE — Assessment & Plan Note (Signed)
Chronic Regular exercise and healthy diet encouraged Check lipid panel  Continue simvastatin 10 mg daily 

## 2021-09-04 NOTE — Assessment & Plan Note (Signed)
Chronic Left leg> right leg secondary to previous DVTs Venous insufficiency is contributing She also has some diastolic dysfunction and that may be contributing as well-has chronic shortness of breath, but that may be related to deconditioning-cardiology did order a echocardiogram which will be done next month Continue low-sodium diet Continue elevating legs when sitting Discussed trying compression socks which would likely help Encouraged regular exercise

## 2021-09-04 NOTE — Assessment & Plan Note (Signed)
Chronic Blood pressure well controlled CMP Continue amlodipine 10 mg daily, benazepril 40 mg daily, spironolactone 25 mg daily 

## 2021-09-09 ENCOUNTER — Telehealth: Payer: Self-pay | Admitting: Internal Medicine

## 2021-09-09 ENCOUNTER — Telehealth: Payer: Self-pay | Admitting: Cardiology

## 2021-09-09 NOTE — Telephone Encounter (Signed)
Patient would like her blood test results from last Wednesday - please advise.

## 2021-09-09 NOTE — Telephone Encounter (Signed)
Results given today 

## 2021-09-09 NOTE — Telephone Encounter (Signed)
error 

## 2021-09-19 ENCOUNTER — Other Ambulatory Visit: Payer: Self-pay | Admitting: Internal Medicine

## 2021-10-02 ENCOUNTER — Ambulatory Visit (INDEPENDENT_AMBULATORY_CARE_PROVIDER_SITE_OTHER): Payer: Medicare Other

## 2021-10-02 DIAGNOSIS — R0602 Shortness of breath: Secondary | ICD-10-CM

## 2021-10-02 LAB — ECHOCARDIOGRAM COMPLETE
AR max vel: 2.37 cm2
AV Area VTI: 2.07 cm2
AV Area mean vel: 2.11 cm2
AV Mean grad: 5 mmHg
AV Peak grad: 10.5 mmHg
Ao pk vel: 1.62 m/s
Area-P 1/2: 4.46 cm2
Calc EF: 55.6 %
S' Lateral: 2.8 cm
Single Plane A2C EF: 52.5 %
Single Plane A4C EF: 58 %

## 2021-10-11 ENCOUNTER — Ambulatory Visit (INDEPENDENT_AMBULATORY_CARE_PROVIDER_SITE_OTHER): Payer: Medicare Other

## 2021-10-11 DIAGNOSIS — Z7901 Long term (current) use of anticoagulants: Secondary | ICD-10-CM | POA: Diagnosis not present

## 2021-10-11 LAB — POCT INR: INR: 2 (ref 2.0–3.0)

## 2021-10-11 NOTE — Progress Notes (Signed)
Continue 1/2 tablet daily except 1 tablet on Mondays and Thursdays.  Re-check in to 6 weeks.

## 2021-10-11 NOTE — Patient Instructions (Addendum)
Pre visit review using our clinic review tool, if applicable. No additional management support is needed unless otherwise documented below in the visit note.  Continue 1/2 tablet daily except 1 tablet on Mondays and Thursdays.  Re-check in to 6 weeks.

## 2021-10-31 ENCOUNTER — Emergency Department (HOSPITAL_BASED_OUTPATIENT_CLINIC_OR_DEPARTMENT_OTHER): Payer: Medicare Other

## 2021-10-31 ENCOUNTER — Other Ambulatory Visit: Payer: Self-pay

## 2021-10-31 ENCOUNTER — Emergency Department (HOSPITAL_BASED_OUTPATIENT_CLINIC_OR_DEPARTMENT_OTHER)
Admission: EM | Admit: 2021-10-31 | Discharge: 2021-10-31 | Disposition: A | Discharge status: Home / Self Care | Payer: Medicare Other | Attending: Emergency Medicine | Admitting: Emergency Medicine

## 2021-10-31 ENCOUNTER — Encounter (HOSPITAL_BASED_OUTPATIENT_CLINIC_OR_DEPARTMENT_OTHER): Payer: Self-pay | Admitting: Emergency Medicine

## 2021-10-31 DIAGNOSIS — I10 Essential (primary) hypertension: Secondary | ICD-10-CM | POA: Insufficient documentation

## 2021-10-31 DIAGNOSIS — Z7902 Long term (current) use of antithrombotics/antiplatelets: Secondary | ICD-10-CM | POA: Insufficient documentation

## 2021-10-31 DIAGNOSIS — K5730 Diverticulosis of large intestine without perforation or abscess without bleeding: Secondary | ICD-10-CM | POA: Diagnosis not present

## 2021-10-31 DIAGNOSIS — X58XXXA Exposure to other specified factors, initial encounter: Secondary | ICD-10-CM | POA: Diagnosis not present

## 2021-10-31 DIAGNOSIS — S32110A Nondisplaced Zone I fracture of sacrum, initial encounter for closed fracture: Secondary | ICD-10-CM | POA: Diagnosis not present

## 2021-10-31 DIAGNOSIS — M160 Bilateral primary osteoarthritis of hip: Secondary | ICD-10-CM | POA: Diagnosis not present

## 2021-10-31 DIAGNOSIS — Z79899 Other long term (current) drug therapy: Secondary | ICD-10-CM | POA: Diagnosis not present

## 2021-10-31 DIAGNOSIS — M545 Low back pain, unspecified: Secondary | ICD-10-CM | POA: Diagnosis not present

## 2021-10-31 DIAGNOSIS — S34139A Unspecified injury to sacral spinal cord, initial encounter: Secondary | ICD-10-CM | POA: Diagnosis present

## 2021-10-31 DIAGNOSIS — M533 Sacrococcygeal disorders, not elsewhere classified: Secondary | ICD-10-CM | POA: Diagnosis not present

## 2021-10-31 DIAGNOSIS — M25551 Pain in right hip: Secondary | ICD-10-CM | POA: Insufficient documentation

## 2021-10-31 DIAGNOSIS — M47816 Spondylosis without myelopathy or radiculopathy, lumbar region: Secondary | ICD-10-CM | POA: Diagnosis not present

## 2021-10-31 MED ORDER — LIDOCAINE 5 % EX PTCH: 1.0000 | MEDICATED_PATCH | CUTANEOUS | 0 refills | Status: DC

## 2021-10-31 MED ORDER — LIDOCAINE 5 % EX PTCH
1.0000 | MEDICATED_PATCH | CUTANEOUS | Status: DC
Start: 2021-10-31 — End: 2021-11-01
  Administered 2021-10-31: 1 via TRANSDERMAL
  Filled 2021-10-31: qty 1

## 2021-10-31 NOTE — ED Notes (Signed)
Pt. Has had R side hip pain and reports this pain has been going on for quite some time but has been worse for the past few days.

## 2021-10-31 NOTE — ED Provider Notes (Signed)
[facility] EMERGENCY DEPARTMENT Provider Note   CSN: [id number] Arrival date & time: 10/31/21  1836     History  Chief Complaint  Patient presents with   Hip Pain    Bonnie Zhang is a 81 y.o. female.  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  Denies any injury or falls.  Woke up on Tuesday with pain.  She has been taking Tylenol with little relief.  Denies any numbness or tingling, no loss of bowel bladder control.    Home Medications Prior to Admission medications   Medication Sig Start Date End Date Taking? Authorizing Provider  lidocaine (LIDODERM) 5 % Place 1 patch onto the skin daily. Remove & Discard patch within 12 hours or as directed by MD 10/31/21  Yes BGarald Balding PA-C  acetaminophen (TYLENOL) 500 MG tablet Take 500-1,000 mg by mouth every 6 (six) hours as needed for mild pain.    [provider]  amLODipine (NORVASC) 10 MG tablet Take 1 tablet (10 mg total) by mouth daily. 03/05/21   BBinnie Rail MD  benazepril (LOTENSIN) 40 MG tablet TAKE 1 TABLET BY MOUTH DAILY. 09/19/21   BBinnie Rail MD  cholecalciferol (VITAMIN D3) 25 MCG (1000 UNIT) tablet Take 2,000 Units by mouth daily.    [provider]  pantoprazole (PROTONIX) 40 MG tablet TAKE 1 TABLET BY MOUTH ONCE DAILY AS NEEDED FOR ACID REFLUX 06/17/21   BBinnie Rail MD  simvastatin (ZOCOR) 10 MG tablet TAKE 1 TABLET BY MOUTH AT BEDTIME. 05/08/21   Burns, SClaudina Lick MD  spironolactone (ALDACTONE) 25 MG tablet Take 1 tablet (25 mg total) by mouth daily. 05/09/21   AKate Sable MD  vitamin B-12 (CYANOCOBALAMIN) 1000 MCG tablet Take 1,000 mcg by mouth daily.    [provider]  warfarin (COUMADIN) 5 MG tablet Take 1/2 tablet daily except take 1 tablet on Mon and Thurs or take as directed by  anticoagulation clinic 08/12/21   BBinnie Rail MD      Allergies    Vicodin [hydrocodone-acetaminophen], Aspirin, Hctz [hydrochlorothiazide], and Lipitor [atorvastatin]    Review of Systems   Review of Systems Ten systems reviewed and are negative for acute change, except as noted in the HPI.   Physical Exam Updated Vital Signs BP (!) 143/56   Pulse 73   Temp 98.1 F (36.7 C) (Oral)   Resp 18   Ht 5' (1.524 m)   Wt 73.9 kg   SpO2 94%   BMI 31.83 kg/m  Physical Exam Vitals and nursing note reviewed.  Constitutional:      General: She is not in acute distress.    Appearance: She is well-developed.  HENT:     Head: Normocephalic and atraumatic.  Eyes:     Conjunctiva/sclera: Conjunctivae normal.  Cardiovascular:     Rate and Rhythm: Normal rate and regular rhythm.     Heart sounds: No murmur heard. Pulmonary:     Effort: Pulmonary effort is normal. No respiratory distress.     Breath sounds: Normal breath sounds.  Abdominal:     Palpations: Abdomen is soft.     Tenderness: There is no abdominal tenderness.  Musculoskeletal:        General: No swelling.     Cervical back: Neck supple.       Legs:  Skin:  General: Skin is warm and dry.     Capillary Refill: Capillary refill takes less than 2 seconds.  Neurological:     Mental Status: She is alert.  Psychiatric:        Mood and Affect: Mood normal.     ED Results / Procedures / Treatments   Labs (all labs ordered are listed, but only abnormal results are displayed) Labs Reviewed - No data to display  EKG None  Radiology CT PELVIS WO CONTRAST  Addendum Date: 10/31/2021   ADDENDUM REPORT: 10/31/2021 21:04 ADDENDUM: Buckling of the cortex of the sacral ala and linear lucency extending through the lateral sacrum on the right suspicious for acute nondisplaced fracture. Electronically Signed   By: Brett Fairy M.D.   On: 10/31/2021 21:04   Result Date: 10/31/2021 CLINICAL DATA:  Right hip pain, history of  cancer. EXAM: CT PELVIS WITHOUT CONTRAST TECHNIQUE: Multidetector CT imaging of the pelvis was performed following the standard protocol without intravenous contrast. RADIATION DOSE REDUCTION: This exam was performed according to the departmental dose-optimization program which includes automated exposure control, adjustment of the mA and/or kV according to patient size and/or use of iterative reconstruction technique. COMPARISON:  10/31/2021, 03/07/2021. FINDINGS: Urinary Tract:  No abnormality visualized. Bowel: Scattered diverticula are present along the colon without evidence of diverticulitis. No acute abnormality. Vascular/Lymphatic: Aortic atherosclerosis. No pelvic lymphadenopathy. Reproductive:  The uterus is surgically absent.  No adnexal mass. Other:  No free fluid in the pelvis. Musculoskeletal: A linear lucency and sclerosis is noted in zone 1 in the sacrum on the left suggesting nondisplaced fracture. No intramuscular hematoma. Vascular calcifications are noted in the lower extremities bilaterally. IMPRESSION: 1. No acute fracture or dislocation at the right hip. 2. Nondisplaced fracture with sclerosis involving the sacrum on the left which is new from 2022, however indeterminate in age. Correlation with physical exam for point tenderness is recommended. Electronically Signed: By: Brett Fairy M.D. On: 10/31/2021 20:50   CT Lumbar Spine Wo Contrast  Result Date: 10/31/2021 CLINICAL DATA:  Low back pain, increased fracture risk EXAM: CT LUMBAR SPINE WITHOUT CONTRAST TECHNIQUE: Multidetector CT imaging of the lumbar spine was performed without intravenous contrast administration. Multiplanar CT image reconstructions were also generated. RADIATION DOSE REDUCTION: This exam was performed according to the departmental dose-optimization program which includes automated exposure control, adjustment of the mA and/or kV according to patient size and/or use of iterative reconstruction technique. COMPARISON:   CT 03/07/2021 FINDINGS: Segmentation: 5 lumbar type vertebrae. Alignment: Trace degenerative anterolisthesis at L3-L4, L4-L5, and L5-S1. Vertebrae: There is a left-sided sacral ala fracture in zone 1, nondisplaced, which extends from the level of S1 inferiorly throughout the field of view to S3. There is an additional right-sided sacral ala fracture in zone 1 at the level of S2 anteriorly (series 4, image 107), which extends superiorly as evidence by buckling of the anterior cortex (series 4, image [id number]). There is no acute lumbar spine fracture. Paraspinal and other soft tissues: Prior cholecystectomy. Aortoiliac atherosclerosis. Disc levels: T12-L1: No significant spinal canal or neural foraminal narrowing. L1-L2: No significant spinal canal or neural foraminal narrowing. L2-L3: No significant spinal canal or neural foraminal narrowing. L3-L4: Mild disc bulging, ligament flavum hypertrophy and moderate bilateral facet arthropathy. There is mild to moderate spinal canal stenosis. No significant neural foraminal stenosis. L4-L5: Mild disc bulging, ligament flavum hypertrophy and moderate bilateral facet arthropathy. There is mild to moderate spinal canal stenosis. No significant neural foraminal stenosis. L5-S1: Prior prior posterior decompression.  Patent spinal canal. Severe bilateral facet arthropathy. No significant neural foraminal stenosis. IMPRESSION: Nondisplaced bilateral sacral ala fractures in zone 1, left more extensive than right. No acute lumbar spine fracture. Mild multilevel degenerative disc disease with moderate severe lower lumbar predominant facet arthropathy. Mild to moderate spinal canal stenosis at L3-L4 and L4-L5. Prior posterior decompression at L5-S1 with patent spinal canal. Electronically Signed   By: Maurine Simmering M.D.   On: 10/31/2021 21:01   DG HIPS BILAT WITH PELVIS 3-4 VIEWS  Result Date: 10/31/2021 CLINICAL DATA:  Right hip and lower back pain EXAM: DG HIP (WITH OR WITHOUT PELVIS)  3-4V BILAT COMPARISON:  CT abdomen and pelvis 03/07/2021 FINDINGS: There is no evidence of hip fracture or dislocation. Mild osteoarthritis both hips. Degenerative changes pubic symphysis, SI joints and lower lumbar spine. IMPRESSION: No acute fracture or dislocation.  Mild osteoarthritis both hips. Electronically Signed   By: Placido Sou M.D.   On: 10/31/2021 19:53   DG Lumbar Spine Complete  Result Date: 10/31/2021 CLINICAL DATA:  Atraumatic right hip pain and mid to lower back pain. EXAM: LUMBAR SPINE - COMPLETE 4+ VIEW COMPARISON:  None Available. FINDINGS: There is no evidence of an acute lumbar spine fracture. A chronic deformity is seen involving the superior endplate of the L1 vertebral body. Alignment is normal. Mild to moderate severity endplate sclerosis is seen throughout all levels of the lumbar spine. Bilateral facet joint hypertrophy is seen at the level of L5-S1. Mild multilevel intervertebral disc space narrowing is noted. IMPRESSION: 1. No acute findings. 2. Mild to moderate severity degenerative changes throughout the lumbar spine. Electronically Signed   By: Virgina Norfolk M.D.   On: 10/31/2021 19:51    Procedures Procedures    Medications Ordered in ED Medications  lidocaine (LIDODERM) 5 % 1 patch (has no administration in time range)    ED Course/ Medical Decision Making/ A&P                           Medical Decision Making Amount and/or Complexity of Data Reviewed Radiology: ordered.   81 year old female presenting with right hip/low back pain.  No inciting injury or fall.  On exam, she has a negative straight leg raise, strength and sensations are intact.  She has point tenderness to the SI joint in the associated lumbar paraspinal muscles.  She denies any loss of bowel or bladder control, numbness, tingling, foot drop.  She was seen ambulating in the ER however slowly.  No labs indicated, I personally ordered, reviewed and interpreted imaging, agree with  radiology read.  Her lumbar spine and pelvic x-rays were overall negative, evidence of osteoarthritis bilaterally.  However given history of endometrioid adenocarcinoma of the uterus, CT scan ordered of the lumbar and hips bilaterally to rule out possible metastatic disease.  Her CT shows bilateral sacral fractures, indeterminate age but likely chronic in nature, left worse than right, however addended read states " Buckling of the cortex of the sacral ala and linear lucency extending through the lateral sacrum on the right suspicious for acute nondisplaced fracture."   Patient states that she did have a fall about a year ago but did not have any imaging done. No recent falls or injures.  We discussed pain management management with the patient.  She lives at home alone and has never taken opioids, thus I have reservation in prescribing this to her.  I encouraged to continue to take Tylenol, apply ice  or heat and will provide lidocaine patches.  She was given 1 here in the ER and I sent a prescription. I encouraged follow-up with her primary care doctor, or sports medicine doctor that she states that she follows with.  She may benefit from some physical therapy.  We discussed return precautions.  She voiced understanding and is agreeable.  Stable for discharge.  Case discussed with Dr. Maryan Rued who is agreeable to the above plan disposition.   Final Clinical Impression(s) / ED Diagnoses Final diagnoses:  Right hip pain  Closed nondisplaced zone I fracture of sacrum, initial encounter [facility])    Rx / DC Orders ED Discharge Orders          Ordered    lidocaine (LIDODERM) 5 %  Every 24 hours        10/31/21 2144              Lyndel Safe 10/31/21 2159    Blanchie Dessert, MD 10/31/21 2312

## 2021-10-31 NOTE — ED Notes (Signed)
Pt.  Going back over to CT for a scan

## 2021-10-31 NOTE — ED Notes (Signed)
Pt. To Radiology via W/C.

## 2021-10-31 NOTE — Discharge Instructions (Signed)
You were evaluated in the Emergency Department and after careful evaluation, we did not find any emergent condition requiring admission or further testing in the hospital.  Continue to take Tylenol, apply heat or ice to the affected area.  You may apply the lidocaine patches as directed.  Please make sure to follow-up with your primary care doctor or your sports medicine doctor.  Please return to the Emergency Department if you experience any worsening of your condition.  Thank you for allowing [place] to be a part of your care.

## 2021-10-31 NOTE — ED Notes (Signed)
Patient transported to CT 

## 2021-10-31 NOTE — ED Triage Notes (Signed)
Right hip pain/mid lower back pain since Tuesday. Denies injury. Pt ambulatory with slow, steady gait.

## 2021-11-04 ENCOUNTER — Ambulatory Visit (INDEPENDENT_AMBULATORY_CARE_PROVIDER_SITE_OTHER): Payer: Medicare Other

## 2021-11-04 DIAGNOSIS — I441 Atrioventricular block, second degree: Secondary | ICD-10-CM

## 2021-11-04 NOTE — Progress Notes (Unsigned)
I, Peterson Lombard, LAT, ATC acting as a scribe for Lynne Leader, MD.  Bonnie Zhang is a 81 y.o. female who presents to [place] at [facility] today for sacral fx w/ hip/LBP. Of note, pt has hx of endometrioid adenocarcinoma of the uterus. Pt was previously seen by Dr. Georgina Snell on 03/14/21 for thoracic back pain. Pt was seen at the [facility] ED on 10/31/21 c/o R-sided low back/hip pain w/ no inciting injury or fall, w/ CT revealing bilateral sacral fx. Today, pt reports lWoke up on Tuesday with pain.  She has been taking Tylenol with little relief.  Denies any numbness or tingling right hip , no MOI, was out an about running errands came home and was in pain , no numbness or tingling her feet feel like they are asleep that comes intermittently only over the last day or two. Has been tylenol for the pain and that is not helping, no radiating pain , pain located to the hip and just below her waist line   Dx imaging: 10/31/21 Pelvis & L-spine CT and L-spine & bilat hip XR  Pertinent review of systems: No fevers or chills  Relevant historical information: Osteoporosis.  History of endometrial cancer with external beam and internal radiation.   Exam:  BP 118/81   Pulse 80   Ht 5' (1.524 m)   Wt 163 lb (73.9 kg)   SpO2 97%   BMI 31.83 kg/m  General: Well Developed, well nourished, and in no acute distress.   MSK: Sacrum: Tender palpation sacrum worse on the right. Antalgic gait.    Lab and Radiology Results ADDENDUM REPORT: 10/31/2021 21:04   ADDENDUM: Buckling of the cortex of the sacral ala and linear lucency extending through the lateral sacrum on the right suspicious for acute nondisplaced fracture.     Electronically Signed   By: Brett Fairy M.D.   On: 10/31/2021 21:04    Addended by Brett Fairy, MD on 10/31/2021  9:06 PM   Study Result  Narrative & Impression  CLINICAL DATA:  Right hip pain, history of cancer.   EXAM: CT PELVIS WITHOUT  CONTRAST   TECHNIQUE: Multidetector CT imaging of the pelvis was performed following the standard protocol without intravenous contrast.   RADIATION DOSE REDUCTION: This exam was performed according to the departmental dose-optimization program which includes automated exposure control, adjustment of the mA and/or kV according to patient size and/or use of iterative reconstruction technique.   COMPARISON:  10/31/2021, 03/07/2021.   FINDINGS: Urinary Tract:  No abnormality visualized.   Bowel: Scattered diverticula are present along the colon without evidence of diverticulitis. No acute abnormality.   Vascular/Lymphatic: Aortic atherosclerosis. No pelvic lymphadenopathy.   Reproductive:  The uterus is surgically absent.  No adnexal mass.   Other:  No free fluid in the pelvis.   Musculoskeletal: A linear lucency and sclerosis is noted in zone 1 in the sacrum on the left suggesting nondisplaced fracture. No intramuscular hematoma. Vascular calcifications are noted in the lower extremities bilaterally.   IMPRESSION: 1. No acute fracture or dislocation at the right hip. 2. Nondisplaced fracture with sclerosis involving the sacrum on the left which is new from 2022, however indeterminate in age. Correlation with physical exam for point tenderness is recommended.   Electronically Signed: By: Brett Fairy M.D. On: 10/31/2021 20:50   I, Lynne Leader, personally (independently) visualized and performed the interpretation of the images attached in this note.     Assessment and Plan:  81 y.o. female with sacrum insufficiency fracture.  This occurs in the setting of osteoporosis currently not on any treatment.  Plan for reduced weightbearing using a walker. We discussed pain control.  She would like to avoid sedating medications so we will just use Tylenol heating pad and Salonpas.  Can use tramadol if needed.  We spent a lot of time talking about osteoporosis management.  We  identified osteoporosis management last year and earlier this year and try to get Prolia approved but was denied.  We never managed to get bisphosphonate started.  After discussion we will start oral alendronate now.  Recheck in 2 weeks.   PDMP not reviewed this encounter. No orders of the defined types were placed in this encounter.  Meds ordered this encounter  Medications   alendronate (FOSAMAX) 70 MG tablet    Sig: Take 1 tablet (70 mg total) by mouth every 7 (seven) days.    Dispense:  4 tablet    Refill:  11     Discussed warning signs or symptoms. Please see discharge instructions. Patient expresses understanding.   The above documentation has been reviewed and is accurate and complete Lynne Leader, M.D.

## 2021-11-05 ENCOUNTER — Ambulatory Visit: Payer: Medicare Other | Admitting: Family Medicine

## 2021-11-05 VITALS — BP 118/81 | HR 80 | Ht 60.0 in | Wt 163.0 lb

## 2021-11-05 DIAGNOSIS — M8000XA Age-related osteoporosis with current pathological fracture, unspecified site, initial encounter for fracture: Secondary | ICD-10-CM

## 2021-11-05 DIAGNOSIS — S322XXA Fracture of coccyx, initial encounter for closed fracture: Secondary | ICD-10-CM

## 2021-11-05 DIAGNOSIS — S3210XA Unspecified fracture of sacrum, initial encounter for closed fracture: Secondary | ICD-10-CM

## 2021-11-05 LAB — CUP PACEART REMOTE DEVICE CHECK
Date Time Interrogation Session: 20230808071036
Implantable Lead Implant Date: 20230203
Implantable Lead Implant Date: 20230203
Implantable Lead Location: 753859
Implantable Lead Location: 753860
Implantable Lead Model: 377171
Implantable Lead Model: 377171
Implantable Lead Serial Number: 8000600801
Implantable Lead Serial Number: 8000640919
Implantable Pulse Generator Implant Date: 20230203
Pulse Gen Model: 407145
Pulse Gen Serial Number: 70290684

## 2021-11-05 MED ORDER — ALENDRONATE SODIUM 70 MG PO TABS: 70.0000 mg | ORAL_TABLET | ORAL | 11 refills | Status: DC

## 2021-11-05 NOTE — Patient Instructions (Addendum)
Good to see you   Tylenol arthritis 2 every 8 hours.   Lidocaine patches.   Heat.   Use a walker.   Recheck in 2 weeks.   Alendronate Tablets What is this medication? ALENDRONATE (a LEN droe nate) prevents and treats osteoporosis. It may also be used to treat Paget disease of the bone. It works by Paramedic stronger and less likely to break (fracture). It belongs to a group of medications called bisphosphonates. This medicine may be used for other purposes; ask your health care provider or pharmacist if you have questions. COMMON BRAND NAME(S): Fosamax What should I tell my care team before I take this medication? They need to know if you have any of these conditions: Bleeding disorder Cancer Dental disease Difficulty swallowing Infection (fever, chills, cough, sore throat, pain or trouble passing urine) Kidney disease Low levels of calcium or other minerals in the blood Low red blood cell counts Receiving steroids like dexamethasone or prednisone Stomach or intestine problems Trouble sitting or standing for 30 minutes An unusual or allergic reaction to alendronate, other medications, foods, dyes or preservatives Pregnant or trying to get pregnant Breast-feeding How should I use this medication? Take this medication by mouth with a full glass of water. Take it as directed on the prescription label at the same time every day. Take the dose right after waking up. Do not eat or drink anything before taking it. Do not take it with any other drink except water. Do not chew or crush the tablet. After taking it, do not eat breakfast, drink, or take any other medications or vitamins for at least 30 minutes. Sit or stand up for at least 30 minutes after you take it. Do not lie down. Keep taking it unless your care team tells you to stop. A special MedGuide will be given to you by the pharmacist with each prescription and refill. Be sure to read this information carefully each  time. Talk to your care team about the use of this medication in children. Special care may be needed. Overdosage: If you think you have taken too much of this medicine contact a poison control center or emergency room at once. NOTE: This medicine is only for you. Do not share this medicine with others. What if I miss a dose? If you take your medication once a day, skip it. Take your next dose at the scheduled time the next morning. Do not take two doses on the same day. If you take your medication once a week, take the missed dose on the morning after you remember. Do not take two doses on the same day. What may interact with this medication? Aluminum hydroxide Antacids Aspirin Calcium supplements Medications for inflammation like ibuprofen, naproxen, and others Iron supplements Magnesium supplements Vitamins with minerals This list may not describe all possible interactions. Give your health care provider a list of all the medicines, herbs, non-prescription drugs, or dietary supplements you use. Also tell them if you smoke, drink alcohol, or use illegal drugs. Some items may interact with your medicine. What should I watch for while using this medication? Visit your care team for regular checks on your progress. It may be some time before you see the benefit from this medication. Some people who take this medication have severe bone, joint, or muscle pain. This medication may also increase your risk for jaw problems or a broken thigh bone. Tell your care team right away if you have severe pain in your  jaw, bones, joints, or muscles. Tell you care team if you have any pain that does not go away or that gets worse. Tell your dentist and dental surgeon that you are taking this medication. You should not have major dental surgery while on this medication. See your dentist to have a dental exam and fix any dental problems before starting this medication. Take good care of your teeth while on this  medication. Make sure you see your dentist for regular follow-up appointments. You should make sure you get enough calcium and vitamin D while you are taking this medication. Discuss the foods you eat and the vitamins you take with your care team. You may need blood work done while you are taking this medication. What side effects may I notice from receiving this medication? Side effects that you should report to your care team as soon as possible: Allergic reactions--skin rash, itching, hives, swelling of the face, lips, tongue, or throat Low calcium level--muscle pain or cramps, confusion, tingling, or numbness in the hands or feet Osteonecrosis of the jaw--pain, swelling, or redness in the mouth, numbness of the jaw, poor healing after dental work, unusual discharge from the mouth, visible bones in the mouth Pain or trouble swallowing Severe bone, joint, or muscle pain Stomach bleeding--bloody or black, tar-like stools, vomiting blood or brown material that looks like coffee grounds Side effects that usually do not require medical attention (report to your care team if they continue or are bothersome): Constipation Diarrhea Nausea Stomach pain This list may not describe all possible side effects. Call your doctor for medical advice about side effects. You may report side effects to FDA at 1-800-FDA-1088. Where should I keep my medication? Keep out of the reach of children and pets. Store at room temperature between 15 and 30 degrees C (59 and 86 degrees F). Throw away any unused medication after the expiration date. NOTE: This sheet is a summary. It may not cover all possible information. If you have questions about this medicine, talk to your doctor, pharmacist, or health care provider.  2023 Elsevier/Gold Standard (2007-05-08 00:00:00)

## 2021-11-18 ENCOUNTER — Telehealth: Payer: Self-pay | Admitting: *Deleted

## 2021-11-18 ENCOUNTER — Telehealth: Payer: Self-pay | Admitting: Family Medicine

## 2021-11-18 NOTE — Telephone Encounter (Signed)
Pt is not feeling much better since her visit 8/8. She is scheduled to reck 8/25. She tried the Tylenol Arthritis but stopped as she felt nausea/lack of appetite. Only taking regular OTC Tylenol but does not feel a benefit.  Can we recommend something else to help with pain, pt not interested in narcotics.

## 2021-11-18 NOTE — Telephone Encounter (Signed)
RETURNED PATIENT'S PHONE CALL, SPOKE WITH PATIENT. ?

## 2021-11-18 NOTE — Telephone Encounter (Signed)
Patient called and scheduled her follow up appt for 12/19

## 2021-11-19 ENCOUNTER — Ambulatory Visit: Payer: Medicare Other | Admitting: Family Medicine

## 2021-11-19 NOTE — Telephone Encounter (Signed)
Every medicine that I could pick is going to have some side effects that may cause problems.  I am going to give you some options and you let me know what you think would be best.  You cannot take ibuprofen type medications safely because of the blood thinner warfarin.  Stronger pain medicines are always going to be sedating including narcotics which will be sedating and can cause some constipation and will increase her fall risk but a low-dose of narcotic like tramadol may be your best option.  Muscle relaxers I do not think would work very well but could be an alternative.  They generally are pretty sedating and can increase fall risk but at least are not narcotics.  I am happy to prescribe 1 of those if he would like me to.  Beyond that there is not really a lot of good medicine pain options.  Heat good idea.  Topical Voltaren gel is a good idea.  Let me know what you

## 2021-11-19 NOTE — Telephone Encounter (Signed)
Called pt, read entire response from Dr. Georgina Snell.  She feels a little bit better today and does not wish to make any changes at this time.

## 2021-11-20 ENCOUNTER — Other Ambulatory Visit: Payer: Self-pay | Admitting: Internal Medicine

## 2021-11-21 ENCOUNTER — Ambulatory Visit: Payer: Medicare Other | Admitting: Radiation Oncology

## 2021-11-22 ENCOUNTER — Ambulatory Visit (INDEPENDENT_AMBULATORY_CARE_PROVIDER_SITE_OTHER): Payer: Medicare Other

## 2021-11-22 ENCOUNTER — Ambulatory Visit: Payer: Medicare Other | Admitting: Family Medicine

## 2021-11-22 DIAGNOSIS — Z7901 Long term (current) use of anticoagulants: Secondary | ICD-10-CM | POA: Diagnosis not present

## 2021-11-22 LAB — POCT INR: INR: 2.7 (ref 2.0–3.0)

## 2021-11-22 NOTE — Progress Notes (Signed)
Continue 1/2 tablet daily except 1 tablet on Mondays and Thursdays.  Re-check in to 6 weeks.

## 2021-11-22 NOTE — Patient Instructions (Addendum)
Pre visit review using our clinic review tool, if applicable. No additional management support is needed unless otherwise documented below in the visit note.  Continue 1/2 tablet daily except 1 tablet on Mondays and Thursdays.  Re-check in to 6 weeks.

## 2021-12-04 NOTE — Progress Notes (Signed)
Remote ICD transmission.   

## 2021-12-12 ENCOUNTER — Telehealth: Payer: Self-pay | Admitting: *Deleted

## 2021-12-12 NOTE — Telephone Encounter (Signed)
RETURNED PATIENT'S PHONE CALL, SPOKE WITH PATIENT. ?

## 2021-12-16 ENCOUNTER — Ambulatory Visit: Payer: Medicare Other | Admitting: Radiation Oncology

## 2021-12-31 ENCOUNTER — Ambulatory Visit (INDEPENDENT_AMBULATORY_CARE_PROVIDER_SITE_OTHER): Payer: Medicare Other

## 2021-12-31 DIAGNOSIS — Z7901 Long term (current) use of anticoagulants: Secondary | ICD-10-CM

## 2021-12-31 LAB — POCT INR: INR: 2.9 (ref 2.0–3.0)

## 2021-12-31 NOTE — Progress Notes (Signed)
Continue 1/2 tablet daily except 1 tablet on Mondays and Thursdays.  Recheck in 6 weeks. 

## 2021-12-31 NOTE — Patient Instructions (Signed)
Continue 1/2 tablet daily except 1 tablet on Mondays and Thursdays.  Recheck in 6 weeks. 

## 2022-01-01 ENCOUNTER — Ambulatory Visit: Payer: Medicare Other | Admitting: Gastroenterology

## 2022-01-01 NOTE — Progress Notes (Signed)
Radiation Oncology         [phone] ________________________________  Name: Bonnie Zhang MRN: [id number]  Date: 01/02/2022  DOB: [birth year]  Follow-Up Visit Note  CC: [name], MD  [name], MD  No diagnosis found.  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 Since her last visit,  the patient followed up with Dr. Berline Lopes on 08/19/21. During which time, the patient reported ongoing minimal intermittent pink spotting with her dilator use (uses every other day), and urinary frequency. She otherwise denied any spotting outside of dilator use, any other symptoms, and was noted as NED on examination.     The patient also presented to the ED several months ago on 10/31/21 with complaints of right sided hip pain. CT of the pelvis performed showed a nondisplaced fracture with sclerosis involving the sacrum on the left, though indeterminate in it's age. 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 At discharge, the patient was encouraged to continue tylenol use, apply ice and heat, and she was given lidocaine patches. The patient has been meeting with sports medicine for further management of this.    ***                            Allergies:  is allergic to vicodin [hydrocodone-acetaminophen], aspirin, hctz [hydrochlorothiazide], and lipitor  [atorvastatin].  Meds: Current Outpatient Medications  Medication Sig Dispense Refill   acetaminophen (TYLENOL) 500 MG tablet Take 500-1,000 mg by mouth every 6 (six) hours as needed for mild pain.     alendronate (FOSAMAX) 70 MG tablet Take 1 tablet (70 mg total) by mouth every 7 (seven) days. (Patient not taking: Reported on 12/31/2021) 4 tablet 11   amLODipine (NORVASC) 10 MG tablet Take 1 tablet (10 mg total) by mouth daily. 90 tablet 3   benazepril (LOTENSIN) 40 MG tablet TAKE 1 TABLET BY MOUTH DAILY. 90 tablet 1   cholecalciferol (VITAMIN D3) 25 MCG (1000 UNIT) tablet Take 2,000 Units by mouth daily.     lidocaine (LIDODERM) 5 % Place 1 patch onto the skin daily. Remove & Discard patch within 12 hours or as directed by MD (Patient not taking: Reported on 12/31/2021) 30 patch 0   pantoprazole (PROTONIX) 40 MG tablet TAKE 1 TABLET BY MOUTH ONCE DAILY AS NEEDED FOR ACID REFLUX 90 tablet 0   simvastatin (ZOCOR) 10 MG tablet TAKE 1 TABLET BY MOUTH AT BEDTIME. 90 tablet 1   spironolactone (ALDACTONE) 25 MG tablet Take 1 tablet (25 mg total) by mouth daily. 90 tablet 3   vitamin B-12 (CYANOCOBALAMIN) 1000 MCG tablet Take 1,000 mcg by mouth daily.     warfarin (COUMADIN) 5 MG tablet Take 1/2 tablet daily except take 1 tablet on Mon and Thurs or take as directed by anticoagulation clinic 90 tablet 1   No current facility-administered medications for this encounter.  Physical Findings: The patient is in no acute distress. Patient is alert and oriented.  vitals were not taken for this visit. .  No significant changes. Lungs are clear to auscultation bilaterally. Heart has regular rate and rhythm. No palpable cervical, supraclavicular, or axillary adenopathy. Abdomen soft, non-tender, normal bowel sounds.  On pelvic examination the external genitalia were unremarkable. A speculum exam was performed. There are no mucosal lesions noted in the vaginal vault. A Pap smear was obtained of the proximal  vagina. On bimanual and rectovaginal examination there were no pelvic masses appreciated. ***    Lab Findings: Lab Results  Component Value Date   WBC 4.7 09/04/2021   HGB 13.4 09/04/2021   HCT 40.3 09/04/2021   MCV 89.3 09/04/2021   PLT 192.0 09/04/2021    Radiographic Findings: No results found.  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  ***  Plan:  ***   *** minutes of total time was spent for this patient encounter, including preparation, face-to-face counseling with the patient and coordination of care, physical exam, and documentation of the encounter. ____________________________________  Blair Promise, PhD, MD  This document serves as a record of services personally performed by Gery Pray, MD. It was created on his behalf by Roney Mans, a trained medical scribe. The creation of this record is based on the scribe's personal observations and the provider's statements to them. This document has been checked and approved by the attending provider.

## 2022-01-02 ENCOUNTER — Ambulatory Visit
Admission: RE | Admit: 2022-01-02 | Discharge: 2022-01-02 | Disposition: A | Discharge status: Home / Self Care | Payer: Medicare Other | Source: Ambulatory Visit | Attending: Radiation Oncology | Admitting: Radiation Oncology

## 2022-01-02 VITALS — BP 135/51 | HR 68 | Temp 96.5°F | Resp 18 | Ht 60.0 in | Wt 157.0 lb

## 2022-01-02 DIAGNOSIS — C541 Malignant neoplasm of endometrium: Secondary | ICD-10-CM

## 2022-01-02 DIAGNOSIS — S3210XD Unspecified fracture of sacrum, subsequent encounter for fracture with routine healing: Secondary | ICD-10-CM | POA: Diagnosis not present

## 2022-01-02 DIAGNOSIS — Z7901 Long term (current) use of anticoagulants: Secondary | ICD-10-CM | POA: Diagnosis not present

## 2022-01-02 DIAGNOSIS — Z923 Personal history of irradiation: Secondary | ICD-10-CM | POA: Insufficient documentation

## 2022-01-02 DIAGNOSIS — Z8542 Personal history of malignant neoplasm of other parts of uterus: Secondary | ICD-10-CM | POA: Diagnosis present

## 2022-01-02 DIAGNOSIS — Z79899 Other long term (current) drug therapy: Secondary | ICD-10-CM | POA: Insufficient documentation

## 2022-01-02 NOTE — Progress Notes (Signed)
Bonnie Zhang is here today for follow up post radiation to the pelvic.  They completed their radiation on: 11/23/19   Does the patient complain of any of the following:  Pain:no Abdominal bloating: no Diarrhea/Constipation: no Nausea/Vomiting: no Vaginal Discharge: no Blood in Urine or Stool: no Urinary Issues (dysuria/incomplete emptying/ incontinence/ increased frequency/urgency): no Does patient report using vaginal dilator 2-3 times a week and/or sexually active 2-3 weeks: Yes - using it every other day. Post radiation skin changes: no   Additional comments if applicable: Reports having some pink discharge after using the dilator. She also fractured her tail bone in August.  BP (!) 135/51 (BP Location: Left Arm, Patient Position: Sitting)   Pulse 68   Temp (!) 96.5 F (35.8 C) (Temporal)   Resp 18   Ht 5' (1.524 m)   Wt 157 lb (71.2 kg)   SpO2 100%   BMI 30.66 kg/m

## 2022-01-21 ENCOUNTER — Telehealth: Payer: Self-pay | Admitting: Internal Medicine

## 2022-01-21 NOTE — Telephone Encounter (Signed)
Appointment made for tomorrow

## 2022-01-21 NOTE — Progress Notes (Unsigned)
    Subjective:    Patient ID: Bonnie Zhang, female    DOB: Aug 17, 1940, 81 y.o.   MRN: [id number]      HPI Bonnie Zhang is here for No chief complaint on file.    UTI and frequent urination -    Ucx  08/2020: Klebsiella - res ampiciilin, intermediate nitrofurantoin   Medications and allergies reviewed with patient and updated if appropriate.  Current Outpatient Medications on File Prior to Visit  Medication Sig Dispense Refill   acetaminophen (TYLENOL) 500 MG tablet Take 500-1,000 mg by mouth every 6 (six) hours as needed for mild pain.     amLODipine (NORVASC) 10 MG tablet Take 1 tablet (10 mg total) by mouth daily. 90 tablet 3   benazepril (LOTENSIN) 40 MG tablet TAKE 1 TABLET BY MOUTH DAILY. 90 tablet 1   cholecalciferol (VITAMIN D3) 25 MCG (1000 UNIT) tablet Take 2,000 Units by mouth daily.     pantoprazole (PROTONIX) 40 MG tablet TAKE 1 TABLET BY MOUTH ONCE DAILY AS NEEDED FOR ACID REFLUX 90 tablet 0   simvastatin (ZOCOR) 10 MG tablet TAKE 1 TABLET BY MOUTH AT BEDTIME. 90 tablet 1   spironolactone (ALDACTONE) 25 MG tablet Take 1 tablet (25 mg total) by mouth daily. 90 tablet 3   vitamin B-12 (CYANOCOBALAMIN) 1000 MCG tablet Take 1,000 mcg by mouth daily.     warfarin (COUMADIN) 5 MG tablet Take 1/2 tablet daily except take 1 tablet on Mon and Thurs or take as directed by anticoagulation clinic 90 tablet 1   No current facility-administered medications on file prior to visit.    Review of Systems     Objective:  There were no vitals filed for this visit. BP Readings from Last 3 Encounters:  01/02/22 (!) 135/51  11/05/21 118/81  10/31/21 (!) 135/54   Wt Readings from Last 3 Encounters:  01/02/22 157 lb (71.2 kg)  11/05/21 163 lb (73.9 kg)  10/31/21 163 lb (73.9 kg)   There is no height or weight on file to calculate BMI.    Physical Exam         Assessment & Plan:    See Problem List for Assessment and Plan of chronic medical problems.

## 2022-01-21 NOTE — Telephone Encounter (Signed)
Patient is having burning upon urination.  She would like for Dr. Quay Burow to put in an order for a lab test so she can come and give a sample of her urine for testing - Please advise and let patient know if that would be okay.

## 2022-01-22 ENCOUNTER — Encounter: Payer: Self-pay | Admitting: Internal Medicine

## 2022-01-22 ENCOUNTER — Ambulatory Visit (INDEPENDENT_AMBULATORY_CARE_PROVIDER_SITE_OTHER): Payer: Medicare Other | Admitting: Internal Medicine

## 2022-01-22 VITALS — BP 122/70 | HR 88 | Temp 98.3°F | Ht 60.0 in | Wt 156.0 lb

## 2022-01-22 DIAGNOSIS — I10 Essential (primary) hypertension: Secondary | ICD-10-CM | POA: Diagnosis not present

## 2022-01-22 DIAGNOSIS — R300 Dysuria: Secondary | ICD-10-CM

## 2022-01-22 DIAGNOSIS — N3281 Overactive bladder: Secondary | ICD-10-CM | POA: Diagnosis not present

## 2022-01-22 DIAGNOSIS — N3000 Acute cystitis without hematuria: Secondary | ICD-10-CM | POA: Diagnosis not present

## 2022-01-22 LAB — POC URINALSYSI DIPSTICK (AUTOMATED)
Bilirubin, UA: NEGATIVE
Glucose, UA: NEGATIVE
Ketones, UA: NEGATIVE
Nitrite, UA: NEGATIVE
Protein, UA: NEGATIVE
Spec Grav, UA: 1.01 (ref 1.010–1.025)
Urobilinogen, UA: 0.2 E.U./dL
pH, UA: 6 (ref 5.0–8.0)

## 2022-01-22 MED ORDER — CEPHALEXIN 500 MG PO CAPS: 500.0000 mg | ORAL_CAPSULE | Freq: Two times a day (BID) | ORAL | 0 refills | Status: DC

## 2022-01-22 NOTE — Assessment & Plan Note (Signed)
Acute Urine dip consistent with possible UTI Will send urine for culture Her symptoms have improved, but still concerned because of her symptoms 1-2 days ago that she has an infection so we will go ahead and start her on Keflex 500 mg twice daily x7 days.  If culture is negative we will stop antibiotic Take tylenol if needed.   Increase your water intake.  Call if no improvement

## 2022-01-22 NOTE — Assessment & Plan Note (Signed)
Chronic Blood pressure well-controlled Continue amlodipine 10 mg daily, benazepril 40 mg daily, spironolactone 25 mg daily 

## 2022-01-22 NOTE — Patient Instructions (Signed)
Take the antibiotic as prescribed.  Take tylenol if needed.     Increase your water intake.   Call if no improvement     Urinary Tract Infection, Adult A urinary tract infection (UTI) is an infection of any part of the urinary tract, which includes the kidneys, ureters, bladder, and urethra. These organs make, store, and get rid of urine in the body. UTI can be a bladder infection (cystitis) or kidney infection (pyelonephritis). What are the causes? This infection may be caused by fungi, viruses, or bacteria. Bacteria are the most common cause of UTIs. This condition can also be caused by repeated incomplete emptying of the bladder during urination. What increases the risk? This condition is more likely to develop if:  You ignore your need to urinate or hold urine for long periods of time.  You do not empty your bladder completely during urination.  You wipe back to front after urinating or having a bowel movement, if you are female.  You are uncircumcised, if you are female.  You are constipated.  You have a urinary catheter that stays in place (indwelling).  You have a weak defense (immune) system.  You have a medical condition that affects your bowels, kidneys, or bladder.  You have diabetes.  You take antibiotic medicines frequently or for long periods of time, and the antibiotics no longer work well against certain types of infections (antibiotic resistance).  You take medicines that irritate your urinary tract.  You are exposed to chemicals that irritate your urinary tract.  You are female.  What are the signs or symptoms? Symptoms of this condition include:  Fever.  Frequent urination or passing small amounts of urine frequently.  Needing to urinate urgently.  Pain or burning with urination.  Urine that smells bad or unusual.  Cloudy urine.  Pain in the lower abdomen or back.  Trouble urinating.  Blood in the urine.  Vomiting or being less hungry than  normal.  Diarrhea or abdominal pain.  Vaginal discharge, if you are female.  How is this diagnosed? This condition is diagnosed with a medical history and physical exam. You will also need to provide a urine sample to test your urine. Other tests may be done, including:  Blood tests.  Sexually transmitted disease (STD) testing.  If you have had more than one UTI, a cystoscopy or imaging studies may be done to determine the cause of the infections. How is this treated? Treatment for this condition often includes a combination of two or more of the following:  Antibiotic medicine.  Other medicines to treat less common causes of UTI.  Over-the-counter medicines to treat pain.  Drinking enough water to stay hydrated.  Follow these instructions at home:  Take over-the-counter and prescription medicines only as told by your health care provider.  If you were prescribed an antibiotic, take it as told by your health care provider. Do not stop taking the antibiotic even if you start to feel better.  Avoid alcohol, caffeine, tea, and carbonated beverages. They can irritate your bladder.  Drink enough fluid to keep your urine clear or pale yellow.  Keep all follow-up visits as told by your health care provider. This is important.  Make sure to: ? Empty your bladder often and completely. Do not hold urine for long periods of time. ? Empty your bladder before and after sex. ? Wipe from front to back after a bowel movement if you are female. Use each tissue one time when you   wipe. Contact a health care provider if:  You have back pain.  You have a fever.  You feel nauseous or vomit.  Your symptoms do not get better after 3 days.  Your symptoms go away and then return. Get help right away if:  You have severe back pain or lower abdominal pain.  You are vomiting and cannot keep down any medicines or water. This information is not intended to replace advice given to you by  your health care provider. Make sure you discuss any questions you have with your health care provider. Document Released: 12/25/2004 Document Revised: 08/29/2015 Document Reviewed: 02/05/2015 Elsevier Interactive Patient Education  2018 [employer]   

## 2022-01-22 NOTE — Assessment & Plan Note (Signed)
Chronic At baseline has an overactive bladder-she has if there is any treatment for this Discussed that we could consider medication to see if that helped Will discuss at her upcoming follow-up appointment

## 2022-01-24 ENCOUNTER — Ambulatory Visit: Payer: Medicare Other | Admitting: Internal Medicine

## 2022-01-25 LAB — CULTURE, URINE COMPREHENSIVE

## 2022-02-03 ENCOUNTER — Ambulatory Visit: Payer: Medicare Other | Attending: Cardiology | Admitting: Cardiology

## 2022-02-03 ENCOUNTER — Encounter: Payer: Self-pay | Admitting: Cardiology

## 2022-02-03 ENCOUNTER — Ambulatory Visit (INDEPENDENT_AMBULATORY_CARE_PROVIDER_SITE_OTHER): Payer: Medicare Other

## 2022-02-03 VITALS — BP 128/56 | HR 69 | Ht 60.0 in | Wt 154.6 lb

## 2022-02-03 DIAGNOSIS — Z95810 Presence of automatic (implantable) cardiac defibrillator: Secondary | ICD-10-CM | POA: Diagnosis not present

## 2022-02-03 DIAGNOSIS — E7800 Pure hypercholesterolemia, unspecified: Secondary | ICD-10-CM

## 2022-02-03 DIAGNOSIS — I441 Atrioventricular block, second degree: Secondary | ICD-10-CM | POA: Diagnosis not present

## 2022-02-03 DIAGNOSIS — I10 Essential (primary) hypertension: Secondary | ICD-10-CM | POA: Diagnosis not present

## 2022-02-03 NOTE — Progress Notes (Signed)
Cardiology Office Note:    Date:  02/03/2022   ID:  Bonnie Zhang, DOB 1940-07-20, MRN [id number]  PCP:  [name], MD   [facility] Providers Cardiologist:  Kate Sable, MD Electrophysiologist:  Vickie Epley, MD     Referring MD: [name], MD   Chief Complaint  Patient presents with   Follow-up    6 month follow up, no new cardiac concerns     History of Present Illness:    Bonnie Zhang is a 81 y.o. 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   Doing well has no concerns at this time. Shortness of breath is improved since last visit. Echo was obtained to evaluate any dysfunction. Compliant with meds as prescribed. Feels well overall, has no concerns at this time. Last pacemaker check showed normal functioning device.   Prior notes Echo 09/2021 EF 60 to 65% Echo 09/2020 EF 60 to 62%, grade 2 diastolic dysfunction. Lexiscan 09/2020, no evidence for ischemia. Echo 2297 normal systolic function, impaired relaxation, EF 55 to 60%   Past Medical History:  Diagnosis Date   Anticoagulated on Coumadin 2001   2 blood clots   Bruises easily    pt is on Coumadin;last dose of Coumadin 08/05/11 and then Lovenox started 08/07/11   DVT (deep vein thrombosis) in pregnancy     1965 post C section;2001 with prolonged driving while on  HRT   DVT (deep venous thrombosis) ([place]) 2001   Was on Prempro.     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 Gery Pray   Homocysteinemia 01/23/2015   Hx of migraines    yrs ago    Hypertension    takes Benazepril,Amlodipine,and Metoprolol daily    Osteoporosis    PONV (postoperative nausea and vomiting)    Primary localized osteoarthritis of left knee    PVD (peripheral vascular disease) ([place]) 1965, 2001   abnormal venous doppler findings due to recurrent DVT'S left leg   Right knee DJD    knees   Shortness of breath dyspnea    TIA (transient ischemic attack) 2020   Vitamin B12 deficiency (non anemic) 01/24/2015   Vitamin D deficiency    takes VIt D daily    Past Surgical History:  Procedure Laterality Date   ABDOMINAL HYSTERECTOMY     APPENDECTOMY  04/01/1963   BREAST LUMPECTOMY  03/31/1985   CESAREAN SECTION  1961/1965/1966   X 3   CHOLECYSTECTOMY  04/01/1995   COLONOSCOPY W/ POLYPECTOMY  03/31/2005   Dr  Sharlett Iles; due? 2012   [place]  03/31/2010   uterine polyp, Dr Kennon Rounds   ESOPHAGOGASTRODUODENOSCOPY  04/01/1995   HYSTEROSCOPY WITH D & C N/A 08/05/2012   Procedure: DILATATION AND CURETTAGE /HYSTEROSCOPY;  Surgeon: Donnamae Jude, MD;  Location: [facility];  Service: Gynecology;  Laterality: N/A;   PACEMAKER IMPLANT N/A 05/03/2021   Procedure: PACEMAKER IMPLANT;  Surgeon: Evans Lance, MD;  Location: [facility];  Service: Cardiovascular;  Laterality: N/A;   ROBOTIC ASSISTED TOTAL HYSTERECTOMY WITH BILATERAL SALPINGO OOPHERECTOMY Right 09/14/2012  Procedure: ROBOTIC ASSISTED TOTAL HYSTERECTOMY WITH BILATERAL SALPINGO OOPHORECTOMY ,right pelvic LYMPHNODE dissection;  Surgeon: Imagene Gurney A. Alycia Rossetti, MD;  Location: [facility];  Service: Gynecology;  Laterality: Right;   TOTAL KNEE ARTHROPLASTY  08/11/2011   Procedure: TOTAL KNEE ARTHROPLASTY;  Surgeon: Lorn Junes, MD;  Location: [facility];  Service: Orthopedics;  Laterality: Right;  DR Ephriam Knuckles 90 MINUTES FOR THIS CASE   TOTAL KNEE ARTHROPLASTY Left 09/11/2014   Procedure: TOTAL KNEE ARTHROPLASTY;  Surgeon: Elsie Saas, MD;  Location: [facility];  Service: Orthopedics;  Laterality: Left;   WISDOM TOOTH EXTRACTION      Current Medications: Current  Meds  Medication Sig   acetaminophen (TYLENOL) 500 MG tablet Take 500-1,000 mg by mouth every 6 (six) hours as needed for mild pain.   amLODipine (NORVASC) 10 MG tablet Take 1 tablet (10 mg total) by mouth daily.   benazepril (LOTENSIN) 40 MG tablet TAKE 1 TABLET BY MOUTH DAILY.   cholecalciferol (VITAMIN D3) 25 MCG (1000 UNIT) tablet Take 2,000 Units by mouth daily.   pantoprazole (PROTONIX) 40 MG tablet TAKE 1 TABLET BY MOUTH ONCE DAILY AS NEEDED FOR ACID REFLUX   simvastatin (ZOCOR) 10 MG tablet TAKE 1 TABLET BY MOUTH AT BEDTIME.   spironolactone (ALDACTONE) 25 MG tablet Take 1 tablet (25 mg total) by mouth daily.   vitamin B-12 (CYANOCOBALAMIN) 1000 MCG tablet Take 1,000 mcg by mouth daily.   warfarin (COUMADIN) 5 MG tablet Take 1/2 tablet daily except take 1 tablet on Mon and Thurs or take as directed by anticoagulation clinic     Allergies:   Vicodin [hydrocodone-acetaminophen], Aspirin, Hctz [hydrochlorothiazide], and Lipitor [atorvastatin]   Social History   Socioeconomic History   Marital status: Divorced    Spouse name: Not on file   Number of children: 2   Years of education: Not on file   Highest education level: High school graduate  Occupational History   Occupation: Retired  Tobacco Use   Smoking status: Never   Smokeless tobacco: Never  Vaping Use   Vaping Use: Never used  Substance and Sexual Activity   Alcohol use: No   Drug use: No   Sexual activity: Not Currently    Birth control/protection: Post-menopausal    Comment: retired. has 2 daughters  Other Topics Concern   Not on file  Social History Narrative   Pt lives alone she has 2 daughter - right handed- drinks tea, soda sometimes - No regular exercise   Social Determinants of Health   Financial Resource Strain: Low Risk  (03/13/2021)   Overall Financial Resource Strain (CARDIA)    Difficulty of Paying Living Expenses: Not hard at all  Food Insecurity: No Food Insecurity (03/13/2021)   Hunger Vital  Sign    Worried About Running Out of Food in the Last Year: Never true    Ran Out of Food in the Last Year: Never true  Transportation Needs: No Transportation Needs (03/13/2021)   PRAPARE - Hydrologist (Medical): No    Lack of Transportation (Non-Medical): No  Physical Activity: Sufficiently Active (03/13/2021)   Exercise Vital Sign    Days of Exercise per Week: 5 days    Minutes of Exercise per Session: 30 min  Stress: No Stress Concern Present (03/13/2021)   [place]    Feeling of Stress : Not at all  Social Connections: Moderately Integrated (03/13/2021)   Social Connection and Isolation Panel [NHANES]  Frequency of Communication with Friends and Family: More than three times a week    Frequency of Social Gatherings with Friends and Family: More than three times a week    Attends Religious Services: More than 4 times per year    Active Member of [employer] or Organizations: Yes    Attends Archivist Meetings: More than 4 times per year    Marital Status: Widowed     Family History: The patient's family history includes Colon cancer in her maternal grandmother; Deep vein thrombosis in her mother; Diabetes in an other family member; Heart failure in her maternal grandfather; Hypertension in her mother and sister; Kidney disease in her mother; Leukemia in her father; Liver disease in her brother. There is no history of Anesthesia problems, Hypotension, Malignant hyperthermia, Pseudochol deficiency, Stroke, Heart disease, Breast cancer, Ovarian cancer, Uterine cancer, Pancreatic cancer, or Cancer - Prostate.  ROS:   Please see the history of present illness.     All other systems reviewed and are negative.  EKGs/Labs/Other Studies Reviewed:    The following studies were reviewed today:   EKG:  EKG is ordered today.  EKG shows a sensed V paced rhythm   Recent Labs: 05/03/2021:  Magnesium 1.8 09/04/2021: ALT 9; BUN 18; Creatinine, Ser 1.19; Hemoglobin 13.4; Platelets 192.0; Potassium 4.0; Sodium 139  Recent Lipid Panel    Component Value Date/Time   CHOL 171 09/04/2021 1053   CHOL 178 05/25/2014 1121   TRIG 76.0 09/04/2021 1053   TRIG 138 05/25/2014 1121   HDL 81.60 09/04/2021 1053   HDL 76 05/25/2014 1121   CHOLHDL 2 09/04/2021 1053   VLDL 15.2 09/04/2021 1053   LDLCALC 74 09/04/2021 1053   LDLCALC 74 05/25/2014 1121     Risk Assessment/Calculations:          Physical Exam:    VS:  BP (!) 128/56 (BP Location: Left Arm, Patient Position: Sitting, Cuff Size: Normal)   Pulse 69   Ht 5' (1.524 m)   Wt 154 lb 9.6 oz (70.1 kg)   SpO2 98%   BMI 30.19 kg/m     Wt Readings from Last 3 Encounters:  02/03/22 154 lb 9.6 oz (70.1 kg)  01/22/22 156 lb (70.8 kg)  01/02/22 157 lb (71.2 kg)     GEN:  Well nourished, well developed in no acute distress HEENT: Normal NECK: No JVD; No carotid bruits CARDIAC: RRR, no murmurs, rubs, gallops RESPIRATORY:  Clear to auscultation without rales, wheezing or rhonchi  ABDOMEN: Soft, non-tender, non-distended MUSCULOSKELETAL:  No edema; No deformity  SKIN: Warm and dry NEUROLOGIC:  Alert and oriented x 3 PSYCHIATRIC:  Normal affect   ASSESSMENT:    1. AV block, Mobitz 2   2. Primary hypertension   3. Pure hypercholesterolemia    PLAN:    In order of problems listed above:  Mobitz 2 AV block s/p PPM, 05/03/2021.  EKG showing a sensed V paced rhythm.  Echo 10/2021 EF 60 to 65%, shortness of breath likely from deconditioning, currently resolved.  Continue pacemaker checks with device clinic. Hypertension, BP controlled.  Continue Aldactone to 25 mg daily, Norvasc, benazepril Hyperlipidemia, cholesterol controlled.  Continue simvastatin.  Follow-up in 6 months.  Medication Adjustments/Labs and Tests Ordered: Current medicines are reviewed at length with the patient today.  Concerns regarding medicines are  outlined above.  Orders Placed This Encounter  Procedures   EKG 12-Lead    No orders of the defined types were placed in this  encounter.     Patient Instructions  Medication Instructions:   Your physician recommends that you continue on your current medications as directed. Please refer to the Current Medication list given to you today.   *If you need a refill on your cardiac medications before your next appointment, please call your pharmacy*    Follow-Up: At [facility], you and your health needs are our priority.  As part of our continuing mission to provide you with exceptional heart care, we have created designated Provider Care Teams.  These Care Teams include your primary Cardiologist (physician) and Advanced Practice Providers (APPs -  Physician Assistants and Nurse Practitioners) who all work together to provide you with the care you need, when you need it.  We recommend signing up for the patient portal called "MyChart".  Sign up information is provided on this After Visit Summary.  MyChart is used to connect with patients for Virtual Visits (Telemedicine).  Patients are able to view lab/test results, encounter notes, upcoming appointments, etc.  Non-urgent messages can be sent to your provider as well.   To learn more about what you can do with MyChart, go to [email or web address].    Your next appointment:   6 month(s)  The format for your next appointment:   In Person  Provider:   Kate Sable, MD    Other Instructions   Important Information About Sugar         Signed, Kate Sable, MD  02/03/2022 11:39 AM    [place]

## 2022-02-03 NOTE — Patient Instructions (Signed)
Medication Instructions:   Your physician recommends that you continue on your current medications as directed. Please refer to the Current Medication list given to you today.   *If you need a refill on your cardiac medications before your next appointment, please call your pharmacy*    Follow-Up: At [facility], you and your health needs are our priority.  As part of our continuing mission to provide you with exceptional heart care, we have created designated Provider Care Teams.  These Care Teams include your primary Cardiologist (physician) and Advanced Practice Providers (APPs -  Physician Assistants and Nurse Practitioners) who all work together to provide you with the care you need, when you need it.  We recommend signing up for the patient portal called "MyChart".  Sign up information is provided on this After Visit Summary.  MyChart is used to connect with patients for Virtual Visits (Telemedicine).  Patients are able to view lab/test results, encounter notes, upcoming appointments, etc.  Non-urgent messages can be sent to your provider as well.   To learn more about what you can do with MyChart, go to [email or web address].    Your next appointment:   6 month(s)  The format for your next appointment:   In Person  Provider:   Brian Agbor-Etang, MD    Other Instructions    Important Information About Sugar       

## 2022-02-04 LAB — CUP PACEART REMOTE DEVICE CHECK
Date Time Interrogation Session: 20231107075233
Implantable Lead Connection Status: 753985
Implantable Lead Connection Status: 753985
Implantable Lead Implant Date: 20230203
Implantable Lead Implant Date: 20230203
Implantable Lead Location: 753859
Implantable Lead Location: 753860
Implantable Lead Model: 377171
Implantable Lead Model: 377171
Implantable Lead Serial Number: 8000600801
Implantable Lead Serial Number: 8000640919
Implantable Pulse Generator Implant Date: 20230203
Pulse Gen Model: 407145
Pulse Gen Serial Number: 70290684

## 2022-02-05 ENCOUNTER — Other Ambulatory Visit: Payer: Self-pay | Admitting: Internal Medicine

## 2022-02-14 ENCOUNTER — Ambulatory Visit (INDEPENDENT_AMBULATORY_CARE_PROVIDER_SITE_OTHER): Payer: Medicare Other

## 2022-02-14 DIAGNOSIS — Z7901 Long term (current) use of anticoagulants: Secondary | ICD-10-CM | POA: Diagnosis not present

## 2022-02-14 LAB — POCT INR: INR: 2.5 (ref 2.0–3.0)

## 2022-02-14 NOTE — Patient Instructions (Signed)
Continue 1/2 tablet daily except 1 tablet on Mondays and Thursdays.  Recheck on 12/29 at 10:30.

## 2022-02-14 NOTE — Progress Notes (Signed)
Continue 1/2 tablet daily except 1 tablet on Mondays and Thursdays.  Recheck in 6 weeks.

## 2022-03-02 ENCOUNTER — Encounter: Payer: Self-pay | Admitting: Internal Medicine

## 2022-03-02 NOTE — Progress Notes (Unsigned)
      Subjective:    Patient ID: Bonnie Zhang, female    DOB: [birth year], 81 y.o.   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 UTI - last week she started having pelvic pressure.  Worse sometimes when she first gets up.  No frequency, dysuria, cloudiness.  Maybe a little less urination    Medications and allergies reviewed with patient and updated if appropriate.  Current Outpatient Medications on File Prior to Visit  Medication Sig Dispense Refill   acetaminophen (TYLENOL) 500 MG tablet Take 500-1,000 mg by mouth every 6 (six) hours as needed for mild pain.     amLODipine (NORVASC) 10 MG tablet TAKE 1 TABLET (10 MG TOTAL) BY MOUTH DAILY. 90 tablet 3   benazepril (LOTENSIN) 40 MG tablet TAKE 1 TABLET BY MOUTH DAILY. 90 tablet 1   cholecalciferol (VITAMIN D3) 25 MCG (1000 UNIT) tablet Take 2,000 Units by mouth daily.     pantoprazole (PROTONIX) 40 MG tablet TAKE 1 TABLET BY MOUTH ONCE DAILY AS NEEDED FOR ACID REFLUX 90 tablet 0   simvastatin (ZOCOR) 10 MG tablet TAKE 1 TABLET BY MOUTH AT BEDTIME. 90 tablet 1   spironolactone (ALDACTONE) 25 MG tablet Take 1 tablet (25 mg total) by mouth daily. 90 tablet 3   vitamin B-12 (CYANOCOBALAMIN) 1000 MCG tablet Take 1,000 mcg by mouth daily.     warfarin (COUMADIN) 5 MG tablet Take 1/2 tablet daily except take 1 tablet on Mon and Thurs or take as directed by anticoagulation clinic 90 tablet 1   No current facility-administered medications on file prior to visit.     Review of Systems  Constitutional:  Negative for chills and fever.  Respiratory:  Negative for cough, shortness of breath and wheezing.   Cardiovascular:  Negative for chest pain, palpitations and leg swelling.  Neurological:  Positive for headaches (occ). Negative for light-headedness.       Objective:   Vitals:   03/06/22  0955  BP: 118/60  Pulse: 83  Temp: 98.1 F (36.7 C)  SpO2: 97%   BP Readings from Last 3 Encounters:  03/06/22 118/60  02/03/22 (!) 128/56  01/22/22 122/70   Wt Readings from Last 3 Encounters:  03/06/22 155 lb (70.3 kg)  02/03/22 154 lb 9.6 oz (70.1 kg)  01/22/22 156 lb (70.8 kg)   Body mass index is 30.27 kg/m.    Physical Exam     Lab Results  Component Value Date   WBC 4.7 09/04/2021   HGB 13.4 09/04/2021   HCT 40.3 09/04/2021   PLT 192.0 09/04/2021   GLUCOSE 103 (H) 09/04/2021   CHOL 171 09/04/2021   TRIG 76.0 09/04/2021   HDL 81.60 09/04/2021   LDLCALC 74 09/04/2021   ALT 9 09/04/2021   AST 17 09/04/2021   NA 139 09/04/2021   K 4.0 09/04/2021   CL 103 09/04/2021   CREATININE 1.19 09/04/2021   BUN 18 09/04/2021   CO2 27 09/04/2021   TSH 1.48 03/08/2020   INR 2.5 02/14/2022   HGBA1C 5.4 09/04/2021     Assessment & Plan:    See Problem List for Assessment and Plan of chronic medical problems.

## 2022-03-02 NOTE — Patient Instructions (Addendum)
      Blood work was ordered.   The lab is on the first floor.    Medications changes include :   cephalexin 500 mg twice daily for one week for your UTI     Return in about 6 months (around 09/05/2022) for Physical Exam.

## 2022-03-03 NOTE — Progress Notes (Signed)
Remote ICD transmission.   

## 2022-03-06 ENCOUNTER — Other Ambulatory Visit: Payer: Self-pay

## 2022-03-06 ENCOUNTER — Ambulatory Visit (INDEPENDENT_AMBULATORY_CARE_PROVIDER_SITE_OTHER): Payer: Medicare Other | Admitting: Internal Medicine

## 2022-03-06 VITALS — BP 118/60 | HR 83 | Temp 98.1°F | Ht 60.0 in | Wt 155.0 lb

## 2022-03-06 DIAGNOSIS — Z86718 Personal history of other venous thrombosis and embolism: Secondary | ICD-10-CM

## 2022-03-06 DIAGNOSIS — R739 Hyperglycemia, unspecified: Secondary | ICD-10-CM

## 2022-03-06 DIAGNOSIS — R34 Anuria and oliguria: Secondary | ICD-10-CM | POA: Diagnosis not present

## 2022-03-06 DIAGNOSIS — E782 Mixed hyperlipidemia: Secondary | ICD-10-CM | POA: Diagnosis not present

## 2022-03-06 DIAGNOSIS — K219 Gastro-esophageal reflux disease without esophagitis: Secondary | ICD-10-CM

## 2022-03-06 DIAGNOSIS — R300 Dysuria: Secondary | ICD-10-CM

## 2022-03-06 DIAGNOSIS — E538 Deficiency of other specified B group vitamins: Secondary | ICD-10-CM | POA: Diagnosis not present

## 2022-03-06 DIAGNOSIS — E559 Vitamin D deficiency, unspecified: Secondary | ICD-10-CM | POA: Diagnosis not present

## 2022-03-06 DIAGNOSIS — I10 Essential (primary) hypertension: Secondary | ICD-10-CM | POA: Diagnosis not present

## 2022-03-06 DIAGNOSIS — D6851 Activated protein C resistance: Secondary | ICD-10-CM | POA: Diagnosis not present

## 2022-03-06 LAB — POC URINALSYSI DIPSTICK (AUTOMATED)
Glucose, UA: NEGATIVE
Ketones, UA: NEGATIVE
Nitrite, UA: NEGATIVE
Protein, UA: POSITIVE — AB
Spec Grav, UA: 1.025 (ref 1.010–1.025)
Urobilinogen, UA: 0.2 E.U./dL
pH, UA: 6 (ref 5.0–8.0)

## 2022-03-06 LAB — VITAMIN D 25 HYDROXY (VIT D DEFICIENCY, FRACTURES): VITD: 33.46 ng/mL (ref 30.00–100.00)

## 2022-03-06 LAB — COMPREHENSIVE METABOLIC PANEL
ALT: 9 U/L (ref 0–35)
AST: 15 U/L (ref 0–37)
Albumin: 4.6 g/dL (ref 3.5–5.2)
Alkaline Phosphatase: 105 U/L (ref 39–117)
BUN: 18 mg/dL (ref 6–23)
CO2: 27 mEq/L (ref 19–32)
Calcium: 10.1 mg/dL (ref 8.4–10.5)
Chloride: 103 mEq/L (ref 96–112)
Creatinine, Ser: 1.29 mg/dL — ABNORMAL HIGH (ref 0.40–1.20)
GFR: 39.2 mL/min — ABNORMAL LOW (ref >60.00)
Glucose, Bld: 109 mg/dL — ABNORMAL HIGH (ref 70–99)
Potassium: 4.2 mEq/L (ref 3.5–5.1)
Sodium: 139 mEq/L (ref 135–145)
Total Bilirubin: 0.7 mg/dL (ref 0.2–1.2)
Total Protein: 8 g/dL (ref 6.0–8.3)

## 2022-03-06 LAB — LIPID PANEL
Cholesterol: 150 mg/dL (ref 0–200)
HDL: 70.4 mg/dL (ref >39.00)
LDL Cholesterol: 59 mg/dL (ref 0–99)
NonHDL: 80.09
Total CHOL/HDL Ratio: 2
Triglycerides: 105 mg/dL (ref 0.0–149.0)
VLDL: 21 mg/dL (ref 0.0–40.0)

## 2022-03-06 LAB — CBC WITH DIFFERENTIAL/PLATELET
Basophils Absolute: 0 10*3/uL (ref 0.0–0.1)
Basophils Relative: 0.4 % (ref 0.0–3.0)
Eosinophils Absolute: 0 10*3/uL (ref 0.0–0.7)
Eosinophils Relative: 0.9 % (ref 0.0–5.0)
HCT: 39.5 % (ref 36.0–46.0)
Hemoglobin: 13.3 g/dL (ref 12.0–15.0)
Lymphocytes Relative: 22.3 % (ref 12.0–46.0)
Lymphs Abs: 1.2 10*3/uL (ref 0.7–4.0)
MCHC: 33.7 g/dL (ref 30.0–36.0)
MCV: 89.8 fl (ref 78.0–100.0)
Monocytes Absolute: 0.4 10*3/uL (ref 0.1–1.0)
Monocytes Relative: 7.9 % (ref 3.0–12.0)
Neutro Abs: 3.7 10*3/uL (ref 1.4–7.7)
Neutrophils Relative %: 68.5 % (ref 43.0–77.0)
Platelets: 214 10*3/uL (ref 150.0–400.0)
RBC: 4.39 Mil/uL (ref 3.87–5.11)
RDW: 12.8 % (ref 11.5–15.5)
WBC: 5.5 10*3/uL (ref 4.0–10.5)

## 2022-03-06 LAB — VITAMIN B12: Vitamin B-12: 154 pg/mL — ABNORMAL LOW (ref 211–911)

## 2022-03-06 LAB — HEMOGLOBIN A1C: Hgb A1c MFr Bld: 5.3 % (ref 4.6–6.5)

## 2022-03-06 MED ORDER — CEPHALEXIN 500 MG PO CAPS: 500.0000 mg | ORAL_CAPSULE | Freq: Two times a day (BID) | ORAL | 0 refills | Status: DC

## 2022-03-06 NOTE — Assessment & Plan Note (Signed)
Chronic Blood pressure well controlled CMP Continue amlodipine 10 mg daily, benazepril 40 mg daily, spironolactone 25 mg daily

## 2022-03-06 NOTE — Assessment & Plan Note (Signed)
Chronic Taking vitamin D daily Check vitamin D level  

## 2022-03-06 NOTE — Assessment & Plan Note (Signed)
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

## 2022-03-06 NOTE — Assessment & Plan Note (Signed)
Chronic Regular exercise and healthy diet encouraged Check lipid panel  Continue simvastatin 10 mg nightly

## 2022-03-06 NOTE — Assessment & Plan Note (Signed)
Chronic Check a1c Low sugar / carb diet Stressed regular exercise  

## 2022-03-06 NOTE — Assessment & Plan Note (Signed)
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

## 2022-03-06 NOTE — Assessment & Plan Note (Signed)
Chronic GERD controlled Continue pantoprazole 40 mg daily as needed

## 2022-03-06 NOTE — Assessment & Plan Note (Signed)
Acute Has decreased urination, pelvic pressure H/o UTIs Urine dip with possible UTI Start keflex 500 mg bid x 7days Send urine for culture

## 2022-03-06 NOTE — Assessment & Plan Note (Signed)
Chronic Taking B12 supplementation Check B12 level 

## 2022-03-06 NOTE — Addendum Note (Signed)
Addended by: Marcina Millard on: 03/06/2022 11:26 AM   Modules accepted: Orders

## 2022-03-09 LAB — CULTURE, URINE COMPREHENSIVE

## 2022-03-14 ENCOUNTER — Ambulatory Visit (INDEPENDENT_AMBULATORY_CARE_PROVIDER_SITE_OTHER): Payer: Medicare Other | Admitting: *Deleted

## 2022-03-14 DIAGNOSIS — Z0000 Encounter for general adult medical examination without abnormal findings: Secondary | ICD-10-CM

## 2022-03-14 NOTE — Progress Notes (Signed)
Subjective:   Bonnie Zhang is a 81 y.o. female who presents for Medicare Annual (Subsequent) preventive examination. I connected with  Tejal Monroy Rumery on 03/14/22 by a audio enabled telemedicine application and verified that I am speaking with the correct person using two identifiers.  Patient Location: Home  Provider Location: Home Office  I discussed the limitations of evaluation and management by telemedicine. The patient expressed understanding and agreed to proceed.  Review of Systems    Deferred to PCP Cardiac Risk Factors include: advanced age (>62mn, >>19women);dyslipidemia;hypertension     Objective:    There were no vitals filed for this visit. There is no height or weight on file to calculate BMI.     03/14/2022    8:48 AM 01/02/2022    9:22 AM 10/31/2021    6:46 PM 08/09/2021    3:02 PM 05/23/2021    8:24 AM 05/03/2021    6:00 PM 03/13/2021    1:45 PM  Advanced Directives  Does Patient Have a Medical Advance Directive? Yes Yes Yes Yes Yes No Yes  Type of Advance Directive Living will Living will Living will  Living will  Living will  Does patient want to make changes to medical advance directive? No - Patient declined   No - Patient declined     Copy of HTalpain Chart?     Yes - validated most recent copy scanned in chart (See row information)    Would patient like information on creating a medical advance directive?      No - Patient declined     Current Medications (verified) Outpatient Encounter Medications as of 03/14/2022  Medication Sig   acetaminophen (TYLENOL) 500 MG tablet Take 500-1,000 mg by mouth every 6 (six) hours as needed for mild pain.   amLODipine (NORVASC) 10 MG tablet TAKE 1 TABLET (10 MG TOTAL) BY MOUTH DAILY.   benazepril (LOTENSIN) 40 MG tablet TAKE 1 TABLET BY MOUTH DAILY.   cholecalciferol (VITAMIN D3) 25 MCG (1000 UNIT) tablet Take 2,000 Units by mouth daily.   pantoprazole (PROTONIX) 40 MG tablet TAKE 1  TABLET BY MOUTH ONCE DAILY AS NEEDED FOR ACID REFLUX   simvastatin (ZOCOR) 10 MG tablet TAKE 1 TABLET BY MOUTH AT BEDTIME.   spironolactone (ALDACTONE) 25 MG tablet Take 1 tablet (25 mg total) by mouth daily.   vitamin B-12 (CYANOCOBALAMIN) 1000 MCG tablet Take 1,000 mcg by mouth daily.   warfarin (COUMADIN) 5 MG tablet Take 1/2 tablet daily except take 1 tablet on Mon and Thurs or take as directed by anticoagulation clinic   cephALEXin (KEFLEX) 500 MG capsule Take 1 capsule (500 mg total) by mouth 2 (two) times daily. (Patient not taking: Reported on 03/14/2022)   No facility-administered encounter medications on file as of 03/14/2022.    Allergies (verified) Vicodin [hydrocodone-acetaminophen], Aspirin, Hctz [hydrochlorothiazide], and Lipitor [atorvastatin]   History: Past Medical History:  Diagnosis Date   Anticoagulated on Coumadin 2001   2 blood clots   Bruises easily    pt is on Coumadin;last dose of Coumadin 08/05/11 and then Lovenox started 08/07/11   DVT (deep vein thrombosis) in pregnancy     1965 post C section;2001 with prolonged driving while on  HRT   DV[place]) [place]   Was on Prempro.     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 Gery Pray   Homocysteinemia 01/23/2015   Hx of migraines    yrs ago    Hypertension    takes Benazepril,Amlodipine,and Metoprolol daily   Osteoporosis    PONV (postoperative nausea and vomiting)    Primary localized osteoarthritis of left knee    PVD (peripheral vascular disease) ([place]) 1965, 2001   abnormal venous doppler findings due to recurrent DVT'S left leg   Right knee DJD    knees   Shortness of breath dyspnea    TIA (transient ischemic  attack) 2020   Vitamin B12 deficiency (non anemic) 01/24/2015   Vitamin D deficiency    takes VIt D daily   Past Surgical History:  Procedure Laterality Date   ABDOMINAL HYSTERECTOMY     APPENDECTOMY  04/01/1963   BREAST LUMPECTOMY  03/31/1985   CESAREAN SECTION  1961/1965/1966   X 3   CHOLECYSTECTOMY  04/01/1995   COLONOSCOPY W/ POLYPECTOMY  03/31/2005   Dr  Sharlett Iles; due? 2012   [place]  03/31/2010   uterine polyp, Dr Kennon Rounds   ESOPHAGOGASTRODUODENOSCOPY  04/01/1995   HYSTEROSCOPY WITH D & C N/A 08/05/2012   Procedure: DILATATION AND CURETTAGE /HYSTEROSCOPY;  Surgeon: Donnamae Jude, MD;  Location: [facility];  Service: Gynecology;  Laterality: N/A;   PACEMAKER IMPLANT N/A 05/03/2021   Procedure: PACEMAKER IMPLANT;  Surgeon: Evans Lance, MD;  Location: [facility];  Service: Cardiovascular;  Laterality: N/A;   ROBOTIC ASSISTED TOTAL HYSTERECTOMY WITH BILATERAL SALPINGO OOPHERECTOMY Right 09/14/2012   Procedure: ROBOTIC ASSISTED TOTAL HYSTERECTOMY WITH BILATERAL SALPINGO OOPHORECTOMY ,right pelvic LYMPHNODE dissection;  Surgeon: Imagene Gurney A. Alycia Rossetti, MD;  Location: [facility];  Service: Gynecology;  Laterality: Right;   TOTAL KNEE ARTHROPLASTY  08/11/2011   Procedure: TOTAL KNEE ARTHROPLASTY;  Surgeon: Lorn Junes, MD;  Location: [facility];  Service: Orthopedics;  Laterality: Right;  DR Ephriam Knuckles 90 MINUTES FOR THIS CASE   TOTAL KNEE ARTHROPLASTY Left 09/11/2014   Procedure: TOTAL KNEE ARTHROPLASTY;  Surgeon: Elsie Saas, MD;  Location: [facility];  Service: Orthopedics;  Laterality: Left;   WISDOM TOOTH EXTRACTION     Family History  Problem Relation Age of Onset   Kidney disease Mother        ? hypertensive   Hypertension Mother    Deep vein thrombosis Mother        post ankle fracture   Leukemia Father    Hypertension Sister    Liver disease Brother    Colon cancer Maternal Grandmother    Heart failure Maternal Grandfather    Diabetes Other         cousins   Anesthesia problems Neg Hx    Hypotension Neg Hx    Malignant hyperthermia Neg Hx    Pseudochol deficiency Neg Hx    Stroke Neg Hx    Heart disease Neg Hx    Breast cancer Neg Hx    Ovarian cancer Neg Hx    Uterine cancer Neg Hx    Pancreatic cancer Neg Hx    Cancer - Prostate Neg Hx    Social History   Socioeconomic History   Marital status: Divorced    Spouse name: Not on file   Number of children: 2   Years of education: Not on file   Highest education level:  High school graduate  Occupational History   Occupation: Retired  Tobacco Use   Smoking status: Never   Smokeless tobacco: Never  Vaping Use   Vaping Use: Never used  Substance and Sexual Activity   Alcohol use: No   Drug use: No   Sexual activity: Not Currently    Birth control/protection: Post-menopausal    Comment: retired. has 2 daughters  Other Topics Concern   Not on file  Social History Narrative   Pt lives alone she has 2 daughter - right handed- drinks tea, soda sometimes - No regular exercise   Social Determinants of Health   Financial Resource Strain: Low Risk  (03/14/2022)   Overall Financial Resource Strain (CARDIA)    Difficulty of Paying Living Expenses: Not hard at all  Food Insecurity: No Food Insecurity (03/14/2022)   Hunger Vital Sign    Worried About Running Out of Food in the Last Year: Never true    Ran Out of Food in the Last Year: Never true  Transportation Needs: No Transportation Needs (03/14/2022)   PRAPARE - Hydrologist (Medical): No    Lack of Transportation (Non-Medical): No  Physical Activity: Sufficiently Active (03/14/2022)   Exercise Vital Sign    Days of Exercise per Week: 5 days    Minutes of Exercise per Session: 30 min  Stress: No Stress Concern Present (03/14/2022)   [place]    Feeling of Stress : Not at all  Social Connections: Moderately Integrated  (03/14/2022)   Social Connection and Isolation Panel [NHANES]    Frequency of Communication with Friends and Family: More than three times a week    Frequency of Social Gatherings with Friends and Family: More than three times a week    Attends Religious Services: More than 4 times per year    Active Member of [employer] or Organizations: Yes    Attends Music therapist: More than 4 times per year    Marital Status: Divorced    Tobacco Counseling Counseling given: Not Answered   Clinical Intake:  Pre-visit preparation completed: Yes  Pain : No/denies pain     Nutritional Status: BMI > 30  Obese Nutritional Risks: None Diabetes: No  How often do you need to have someone help you when you read instructions, pamphlets, or other written materials from your doctor or pharmacy?: 1 - Never What is the last grade level you completed in school?: 12th  Diabetic?No  Interpreter Needed?: No  Information entered by :: Emelia Loron RN   Activities of Daily Living    03/14/2022    8:45 AM 05/03/2021    6:00 PM  In your present state of health, do you have any difficulty performing the following activities:  Hearing? 0 0  Vision? 0 0  Difficulty concentrating or making decisions? 0 0  Walking or climbing stairs? 0 0  Dressing or bathing? 0 0  Doing errands, shopping? 0 0  Preparing Food and eating ? N   Using the Toilet? N   In the past six months, have you accidently leaked urine? N   Do you have problems with loss of bowel control? N   Managing your Medications? N   Managing your Finances? N   Housekeeping or managing your Housekeeping? N     Patient Care Team: [name], MD as PCP - General (Internal Medicine) Kate Sable, MD as PCP - Cardiology (Cardiology) Lars Mage  T, MD as PCP - Electrophysiology (Cardiology) Josue Hector, MD as Consulting Physician (Cardiology) Heath Lark, MD as Consulting Physician (Hematology and Oncology) Elsie Saas, MD as Consulting Physician (Orthopedic Surgery) Pieter Partridge, DO as Consulting Physician (Neurology) Charlton Haws, [facility] as Pharmacist (Pharmacist)  Indicate any recent Medical Services you may have received from other than [facility] providers in the past year (date may be approximate).     Assessment:   This is a routine wellness examination for [facility].  Hearing/Vision screen No results found.  Dietary issues and exercise activities discussed: Current Exercise Habits: Home exercise routine, Type of exercise: walking (PT exercises), Time (Minutes): 30, Frequency (Times/Week): 5, Weekly Exercise (Minutes/Week): 150, Intensity: Mild, Exercise limited by: orthopedic condition(s)   Goals Addressed             This Visit's Progress    Patient Stated       Maintain current health status.      Depression Screen    03/14/2022    8:40 AM 03/06/2022   10:06 AM 01/22/2022    4:04 PM 09/04/2021   10:21 AM 09/04/2021   10:20 AM 05/15/2021    9:06 AM 03/13/2021    1:47 PM  PHQ 2/9 Scores  PHQ - 2 Score 0 0 0 0 0 0 0  PHQ- 9 Score  0  0       Fall Risk    03/14/2022    8:49 AM 03/06/2022   10:06 AM 01/22/2022    4:04 PM 09/04/2021   10:20 AM 05/15/2021    9:06 AM  [place] in the past year? 0 0 0 0 0  Number falls in past yr: 0 0 0 0 0  Injury with Fall? 0 0 0 0 0  Risk for fall due to : History of fall(s) No Fall Risks No Fall Risks No Fall Risks No Fall Risks  Follow up Falls evaluation completed;Education provided Falls evaluation completed Falls evaluation completed Falls evaluation completed Falls evaluation completed    [place]:  Any stairs in or around the home? Yes  If so, are there any without handrails? No  Home free of loose throw rugs in walkways, pet beds, electrical cords, etc? Yes  Adequate lighting in your home to reduce risk of falls? Yes   ASSISTIVE DEVICES UTILIZED TO PREVENT FALLS:  Life alert? No   Use of a cane, walker or w/c? Yes  Grab bars in the bathroom? Yes  Shower chair or bench in shower? Yes  Elevated toilet seat or a handicapped toilet? Yes   Cognitive Function:    01/07/2018    1:31 PM 12/22/2016    9:38 AM  MMSE - Mini Mental State Exam  Orientation to time 5 5  Orientation to Place 5 5  Registration 3 3  Attention/ Calculation 5 5  Recall 3 2  Language- name 2 objects 2 2  Language- repeat 1 1  Language- follow 3 step command 3 3  Language- read & follow direction 1 1  Write a sentence 1 1  Copy design 1 1  Total score 30 29        03/14/2022    8:50 AM  6CIT Screen  What Year? 0 points  What month? 0 points  What time? 0 points  Count back from 20 0 points  Months in reverse 0 points  Repeat phrase 0 points  Total Score 0 points  Immunizations Immunization History  Administered Date(s) Administered   Janssen (J&J) SARS-COV-2 Vaccination 07/08/2019   PFIZER Comirnaty(Gray [place])Covid-19 Tri-Sucrose Vaccine 04/04/2020   Td 03/21/2010    TDAP status: Due, Education has been provided regarding the importance of this vaccine. Advised may receive this vaccine at local pharmacy or Health Dept. Aware to provide a copy of the vaccination record if obtained from local pharmacy or Health Dept. Verbalized acceptance and understanding.  Flu Vaccine status: Declined, Education has been provided regarding the importance of this vaccine but patient still declined. Advised may receive this vaccine at local pharmacy or Health Dept. Aware to provide a copy of the vaccination record if obtained from local pharmacy or Health Dept. Verbalized acceptance and understanding.  Pneumococcal vaccine status: Due, Education has been provided regarding the importance of this vaccine. Advised may receive this vaccine at local pharmacy or Health Dept. Aware to provide a copy of the vaccination record if obtained from local pharmacy or Health Dept. Verbalized acceptance and  understanding.  Covid-19 vaccine status: Information provided on how to obtain vaccines.   Qualifies for Shingles Vaccine? Yes   Zostavax completed No   Shingrix Completed?: No.    Education has been provided regarding the importance of this vaccine. Patient has been advised to call insurance company to determine out of pocket expense if they have not yet received this vaccine. Advised may also receive vaccine at local pharmacy or Health Dept. Verbalized acceptance and understanding.  Screening Tests Health Maintenance  Topic Date Due   Zoster Vaccines- Shingrix (1 of 2) Never done   Pneumonia Vaccine 56+ Years old (1 - PCV) Never done   DTaP/Tdap/Td (2 - Tdap) 03/21/2020   INFLUENZA VACCINE  06/29/2022 (Originally 10/29/2021)   DEXA SCAN  01/03/2023   Medicare Annual Wellness (AWV)  03/15/2023   HPV VACCINES  Aged Out   COVID-19 Vaccine  Discontinued    Health Maintenance  Health Maintenance Due  Topic Date Due   Zoster Vaccines- Shingrix (1 of 2) Never done   Pneumonia Vaccine 38+ Years old (1 - PCV) Never done   DTaP/Tdap/Td (2 - Tdap) 03/21/2020    Colorectal cancer screening: No longer required.   Mammogram status: No longer required due to age.  Bone Density status: Completed 01/02/21. Results reflect: Bone density results: OSTEOPOROSIS. Repeat every 2 years.  Lung Cancer Screening: (Low Dose CT Chest recommended if Age 28-80 years, 30 pack-year currently smoking OR have quit w/in 15years.) does not qualify.   Additional Screening:  Hepatitis C Screening: does qualify; Completed education provided  Vision Screening: Recommended annual ophthalmology exams for early detection of glaucoma and other disorders of the eye. Is the patient up to date with their annual eye exam?  Yes  Who is the provider or what is the name of the office in which the patient attends annual eye exams? [facility] If pt is not established with a provider, would they like to be referred  to a provider to establish care?  N/A .   Dental Screening: Recommended annual dental exams for proper oral hygiene  Community Resource Referral / Chronic Care Management: CRR required this visit?  No   CCM required this visit?  No      Plan:     I have personally reviewed and noted the following in the patient's chart:   Medical and social history Use of alcohol, tobacco or illicit drugs  Current medications and supplements including opioid prescriptions. Patient is not currently taking  opioid prescriptions. Functional ability and status Nutritional status Physical activity Advanced directives List of other physicians Hospitalizations, surgeries, and ER visits in previous 12 months Vitals Screenings to include cognitive, depression, and falls Referrals and appointments  In addition, I have reviewed and discussed with patient certain preventive protocols, quality metrics, and best practice recommendations. A written personalized care plan for preventive services as well as general preventive health recommendations were provided to patient.     Michiel Cowboy, RN   03/14/2022   Nurse Notes:  Ms. Rayann Heman , Thank you for taking time to come for your Medicare Wellness Visit. I appreciate your ongoing commitment to your health goals. Please review the following plan we discussed and let me know if I can assist you in the future.   These are the goals we discussed:  Goals      Be healthy, as active and as independent as possible     Patient Stated     Stay physically and socially active, enjoy life and family.     Patient Stated     Maintain current health status.     Pharmacy Care Plan     CARE PLAN ENTRY  Current Barriers:  Chronic Disease Management support, education, and care coordination needs related to Hypertension, Hyperlipidemia, and Recurrent DVT   Hypertension Pharmacist Clinical Goal(s): Over the next 90 days, patient will work with PharmD and providers to  maintain BP goal <140/90 Current regimen:  Amlodipine 5 mg daily, extra 5 mg if BP > 145/90 Benazepril 40 mg daily Metoprolol succinate 50 mg daily Spironolactone 12.5 mg (1/2 of 25 mg) daily Interventions: Discussed BP goal and benefits of medications Discussed flushing is not a common side effect of her current medications; however amlodipine can contribute Patient self care activities - Over the next 90 days, patient will: Check BP 1-2x weekly, document, and provide at future appointments Ensure daily salt intake < 2300 mg/day  Hyperlipidemia/history of TIA Lipid Panel     Component Value Date/Time   CHOL 153 03/03/2019 1025   TRIG 135.0 03/03/2019 1025   HDL 59.90 03/03/2019 1025   [place]oster 67 03/03/2019 1025  Pharmacist Clinical Goal(s): Over the next 90 days, patient will work with PharmD and providers to maintain LDL goal < 70 Current regimen:  Simvastatin 10 mg daily Interventions: Discussed cholesterol goals and how simvastatin works best overnight Discussed importance of avoiding high-cholesterol foods Patient self care activities - Over the next 90 days, patient will: Take simvastatin as directed at bedtime Limit high-cholesterol foods in diet  Recurrent DVT Pharmacist Clinical Goal(s) Over the next 90 days, patient will work with PharmD and providers to optimize anticoagulation therapy Current regimen:  Warfarin 5 mg as directed per Anti-Coag clinic Interventions: Discussed bleeding risk with warfarin - patient has not had issues since stopping aspirin years ago Patient self care activities - Over the next 90 days, patient will: Continue to follow up with Jenny Reichmann in [place]  Medication management Pharmacist Clinical Goal(s): Over the next 90 days, patient will work with PharmD and providers to achieve optimal medication adherence Current pharmacy: [facility] Comprehensive medication review  performed. Continue current medication management strategy Patient self care activities - Over the next 90 days, patient will: Focus on medication adherence by pill box Take medications as prescribed Report any questions or concerns to PharmD and/or provider(s)  Initial goal documentation         This is a list of the  screening recommended for you and due dates:  Health Maintenance  Topic Date Due   Zoster (Shingles) Vaccine (1 of 2) Never done   Pneumonia Vaccine (1 - PCV) Never done   DTaP/Tdap/Td vaccine (2 - Tdap) 03/21/2020   Flu Shot  06/29/2022*   DEXA scan (bone density measurement)  01/03/2023   Medicare Annual Wellness Visit  03/15/2023   HPV Vaccine  Aged Out   COVID-19 Vaccine  Discontinued  *Topic was postponed. The date shown is not the original due date.

## 2022-03-14 NOTE — Patient Instructions (Signed)

## 2022-03-15 ENCOUNTER — Other Ambulatory Visit: Payer: Self-pay | Admitting: Internal Medicine

## 2022-03-18 ENCOUNTER — Encounter: Payer: Self-pay | Admitting: Gynecologic Oncology

## 2022-03-18 ENCOUNTER — Inpatient Hospital Stay: Payer: Medicare Other | Attending: Gynecologic Oncology | Admitting: Gynecologic Oncology

## 2022-03-18 ENCOUNTER — Other Ambulatory Visit: Payer: Self-pay

## 2022-03-18 ENCOUNTER — Ambulatory Visit (HOSPITAL_COMMUNITY)
Admission: RE | Admit: 2022-03-18 | Discharge: 2022-03-18 | Disposition: A | Discharge status: Home / Self Care | Payer: Medicare Other | Source: Ambulatory Visit | Attending: Gynecologic Oncology | Admitting: Gynecologic Oncology

## 2022-03-18 VITALS — BP 153/65 | HR 77 | Temp 97.9°F | Resp 14 | Ht 59.45 in | Wt 157.5 lb

## 2022-03-18 DIAGNOSIS — M899 Disorder of bone, unspecified: Secondary | ICD-10-CM | POA: Insufficient documentation

## 2022-03-18 DIAGNOSIS — Z90722 Acquired absence of ovaries, bilateral: Secondary | ICD-10-CM | POA: Diagnosis not present

## 2022-03-18 DIAGNOSIS — Z923 Personal history of irradiation: Secondary | ICD-10-CM | POA: Insufficient documentation

## 2022-03-18 DIAGNOSIS — Z90710 Acquired absence of both cervix and uterus: Secondary | ICD-10-CM | POA: Insufficient documentation

## 2022-03-18 DIAGNOSIS — C541 Malignant neoplasm of endometrium: Secondary | ICD-10-CM

## 2022-03-18 DIAGNOSIS — R350 Frequency of micturition: Secondary | ICD-10-CM

## 2022-03-18 DIAGNOSIS — Z8542 Personal history of malignant neoplasm of other parts of uterus: Secondary | ICD-10-CM | POA: Diagnosis not present

## 2022-03-18 DIAGNOSIS — M533 Sacrococcygeal disorders, not elsewhere classified: Secondary | ICD-10-CM | POA: Diagnosis not present

## 2022-03-18 DIAGNOSIS — S3210XD Unspecified fracture of sacrum, subsequent encounter for fracture with routine healing: Secondary | ICD-10-CM | POA: Diagnosis not present

## 2022-03-18 MED ORDER — AMPICILLIN 500 MG PO CAPS: 500.0000 mg | ORAL_CAPSULE | Freq: Two times a day (BID) | ORAL | 0 refills | Status: AC

## 2022-03-18 MED ORDER — AMPICILLIN 500 MG PO CAPS: 500.0000 mg | ORAL_CAPSULE | Freq: Three times a day (TID) | ORAL | 0 refills | Status: DC

## 2022-03-18 NOTE — Patient Instructions (Addendum)
It was good to see you today.  I do not see or feel any evidence of cancer recurrence on your exam.  We will start by getting some pelvic x-rays given your pressure over the pubic bone.  If these are negative, please reach out and let me know if you are still having this pressure sensation within the next couple of weeks.  If so, we can repeat a CT scan to assure no other causes for the symptoms.  Otherwise, I will plan to see you for follow-up in 12 months.  Please call sometime after June to schedule a visit to see me in December.  As always, if you develop any new and concerning symptoms before your next visit, please call to see me sooner.

## 2022-03-18 NOTE — Progress Notes (Signed)
Gynecologic Oncology Return Clinic Visit  03/18/22  Reason for Visit: surveillance visit in the setting of endometrial cancer   Treatment History: Oncology History Overview Note  MMR IHC intact ER/PR strongly positive   Endometrial cancer ([place])  08/09/2019 Initial Biopsy   Vaginal biopsy: Endometrioid adenoca, FIGO gr 2   08/19/2019 Initial Diagnosis   Endometrial cancer ([place])   09/06/2019 - 11/23/2019 Radiation Therapy   Site Technique Total Dose (Gy) Dose per Fx (Gy) Completed Fx Beam Energies  Vagina: Pelvis_Bst HDR-brachy 24/24 6 4/4 Ir-192  Vagina: Pelvis IMRT 45/45 1.8 25/25 6X       2012: Endometrial sampling showed benign endometrium, polyps 08/05/2012: D&C and hysteroscopy, pathology revealed at least atypical complex endometrial hyperplasia with a focal area worrisome for well differentiated endometrioid carcinoma 09/14/2012: Total robotic hysterectomy, BSO, right pelvic lymph node dissection.  Frozen section with no evidence of cancer, possible hyperplasia.  Final pathology revealed extensive atypical complex hyperplasia.  4 right lymph nodes negative for malignancy. 08/09/2019: Patient presented to her OB/GYN with an episode of pink discharge followed by frank bleeding after using a douche.  Since that time has had continued brown discharge. 08/09/2019: Vaginal biopsy shows endometrioid adenocarcinoma. 08/19/2019: CT of the abdomen and pelvis shows no mass identified in the region of the vaginal cuff.  No pelvic adenopathy or metastatic disease. Recurrence treated with salvage radiation as above. 03/07/2021: CT of the abdomen and pelvis shows no evidence of disease recurrence or metastatic disease. 10/31/21: CT of the pelvis shows nondisplaced fracture.  No adenopathy or signs of metastatic disease.  Interval History: She saw Dr. Sondra Come in early October.  The patient was having some hip pain that developed after running errands.  She was subsequently diagnosed with sacral fracture  in August.  She notes that it took 5-6 weeks to recover from this.  Several weeks ago, she began having intermittent pelvic pressure that she describes as being anteriorly, over her pubic symphysis.  This seems to be more prominent when she is changing position.  She notes pain in this area when she stands up.  She overall reports symptoms are improved from last week, when she saw her primary care provider.  She endorses some minimal pink-tinged on the dilator intermittently when she uses it.  This is unchanged.  She denies any vaginal bleeding.  Past Medical/Surgical History: Past Medical History:  Diagnosis Date   Anticoagulated on Coumadin 2001   2 blood clots   Bruises easily    pt is on Coumadin;last dose of Coumadin 08/05/11 and then Lovenox started 08/07/11   DVT (deep vein thrombosis) in pregnancy     1965 post C section;2001 with prolonged driving while on  HRT   DVT (deep venous thrombosis) ([place]) 2001   Was on Prempro.     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 Gery Pray   Homocysteinemia 01/23/2015   Hx of migraines    yrs ago    Hypertension    takes Benazepril,Amlodipine,and Metoprolol daily   Osteoporosis    PONV (postoperative nausea and vomiting)    Primary localized osteoarthritis of left knee    PVD (peripheral vascular disease) ([place]) 1965, 2001   abnormal  venous doppler findings due to recurrent DVT'S left leg   Right knee DJD    knees   Shortness of breath dyspnea    TIA (transient ischemic attack) 2020   Vitamin B12 deficiency (non anemic) 01/24/2015   Vitamin D deficiency    takes VIt D daily    Past Surgical History:  Procedure Laterality Date   ABDOMINAL HYSTERECTOMY      APPENDECTOMY  04/01/1963   BREAST LUMPECTOMY  03/31/1985   CESAREAN SECTION  1961/1965/1966   X 3   CHOLECYSTECTOMY  04/01/1995   COLONOSCOPY W/ POLYPECTOMY  03/31/2005   Dr  Sharlett Iles; due? 2012   [place]  03/31/2010   uterine polyp, Dr Kennon Rounds   ESOPHAGOGASTRODUODENOSCOPY  04/01/1995   HYSTEROSCOPY WITH D & C N/A 08/05/2012   Procedure: DILATATION AND CURETTAGE /HYSTEROSCOPY;  Surgeon: Donnamae Jude, MD;  Location: [facility];  Service: Gynecology;  Laterality: N/A;   PACEMAKER IMPLANT N/A 05/03/2021   Procedure: PACEMAKER IMPLANT;  Surgeon: Evans Lance, MD;  Location: [facility];  Service: Cardiovascular;  Laterality: N/A;   ROBOTIC ASSISTED TOTAL HYSTERECTOMY WITH BILATERAL SALPINGO OOPHERECTOMY Right 09/14/2012   Procedure: ROBOTIC ASSISTED TOTAL HYSTERECTOMY WITH BILATERAL SALPINGO OOPHORECTOMY ,right pelvic LYMPHNODE dissection;  Surgeon: Imagene Gurney A. Alycia Rossetti, MD;  Location: [facility];  Service: Gynecology;  Laterality: Right;   TOTAL KNEE ARTHROPLASTY  08/11/2011   Procedure: TOTAL KNEE ARTHROPLASTY;  Surgeon: Lorn Junes, MD;  Location: [facility];  Service: Orthopedics;  Laterality: Right;  DR Ephriam Knuckles 90 MINUTES FOR THIS CASE   TOTAL KNEE ARTHROPLASTY Left 09/11/2014   Procedure: TOTAL KNEE ARTHROPLASTY;  Surgeon: Elsie Saas, MD;  Location: [facility];  Service: Orthopedics;  Laterality: Left;   WISDOM TOOTH EXTRACTION      Family History  Problem Relation Age of Onset   Kidney disease Mother        ? hypertensive   Hypertension Mother    Deep vein thrombosis Mother        post ankle fracture   Leukemia Father    Hypertension Sister    Liver disease Brother    Colon cancer Maternal Grandmother    Heart failure Maternal Grandfather    Diabetes Other        cousins   Anesthesia problems Neg Hx    Hypotension Neg Hx    Malignant hyperthermia Neg Hx    Pseudochol deficiency Neg Hx    Stroke Neg Hx    Heart disease Neg Hx    Breast cancer Neg Hx     Ovarian cancer Neg Hx    Uterine cancer Neg Hx    Pancreatic cancer Neg Hx    Cancer - Prostate Neg Hx     Social History   Socioeconomic History   Marital status: Divorced    Spouse name: Not on file   Number of children: 2   Years of education: Not on file   Highest education level: High school graduate  Occupational History   Occupation: Retired  Tobacco Use   Smoking status: Never   Smokeless tobacco: Never  Vaping Use   Vaping Use: Never used  Substance and Sexual Activity   Alcohol use: No   Drug use: No   Sexual activity: Not Currently    Birth control/protection: Post-menopausal    Comment: retired. has 2 daughters  Other Topics Concern   Not on file  Social History Narrative   Pt lives alone she has 2  daughter - right handed- drinks tea, soda sometimes - No regular exercise   Social Determinants of Health   Financial Resource Strain: Low Risk  (03/14/2022)   Overall Financial Resource Strain (CARDIA)    Difficulty of Paying Living Expenses: Not hard at all  Food Insecurity: No Food Insecurity (03/14/2022)   Hunger Vital Sign    Worried About Running Out of Food in the Last Year: Never true    Ran Out of Food in the Last Year: Never true  Transportation Needs: No Transportation Needs (03/14/2022)   PRAPARE - Hydrologist (Medical): No    Lack of Transportation (Non-Medical): No  Physical Activity: Sufficiently Active (03/14/2022)   Exercise Vital Sign    Days of Exercise per Week: 5 days    Minutes of Exercise per Session: 30 min  Stress: No Stress Concern Present (03/14/2022)   [place]    Feeling of Stress : Not at all  Social Connections: Moderately Integrated (03/14/2022)   Social Connection and Isolation Panel [NHANES]    Frequency of Communication with Friends and Family: More than three times a week    Frequency of Social Gatherings with Friends and  Family: More than three times a week    Attends Religious Services: More than 4 times per year    Active Member of [employer] or Organizations: Yes    Attends Music therapist: More than 4 times per year    Marital Status: Divorced    Current Medications:  Current Outpatient Medications:    acetaminophen (TYLENOL) 500 MG tablet, Take 500-1,000 mg by mouth every 6 (six) hours as needed for mild pain., Disp: , Rfl:    amLODipine (NORVASC) 10 MG tablet, TAKE 1 TABLET (10 MG TOTAL) BY MOUTH DAILY., Disp: 90 tablet, Rfl: 3   ampicillin (PRINCIPEN) 500 MG capsule, Take 1 capsule (500 mg total) by mouth in the morning and at bedtime for 5 days., Disp: 10 capsule, Rfl: 0   benazepril (LOTENSIN) 40 MG tablet, TAKE 1 TABLET BY MOUTH DAILY., Disp: 90 tablet, Rfl: 1   cholecalciferol (VITAMIN D3) 25 MCG (1000 UNIT) tablet, Take 2,000 Units by mouth daily., Disp: , Rfl:    pantoprazole (PROTONIX) 40 MG tablet, TAKE 1 TABLET BY MOUTH ONCE DAILY AS NEEDED FOR ACID REFLUX, Disp: 90 tablet, Rfl: 0   simvastatin (ZOCOR) 10 MG tablet, TAKE 1 TABLET BY MOUTH AT BEDTIME., Disp: 90 tablet, Rfl: 1   spironolactone (ALDACTONE) 25 MG tablet, Take 1 tablet (25 mg total) by mouth daily., Disp: 90 tablet, Rfl: 3   vitamin B-12 (CYANOCOBALAMIN) 1000 MCG tablet, Take 1,000 mcg by mouth daily., Disp: , Rfl:    warfarin (COUMADIN) 5 MG tablet, Take 1/2 tablet daily except take 1 tablet on Mon and Thurs or take as directed by anticoagulation clinic, Disp: 90 tablet, Rfl: 1  Review of Systems: Denies appetite changes, fevers, chills, fatigue, unexplained weight changes. Denies hearing loss, neck lumps or masses, mouth sores, ringing in ears or voice changes. Denies cough or wheezing.  Denies shortness of breath. Denies chest pain or palpitations. Denies leg swelling. Denies abdominal distention, pain, blood in stools, constipation, diarrhea, nausea, vomiting, or early satiety. Denies pain with intercourse, dysuria,  frequency, hematuria or incontinence. Denies hot flashes, pelvic pain, vaginal bleeding or vaginal discharge.   Denies joint pain, back pain or muscle pain/cramps. Denies itching, rash, or wounds. Denies dizziness, headaches, numbness or seizures. Denies  swollen lymph nodes or glands, denies easy bruising or bleeding. Denies anxiety, depression, confusion, or decreased concentration.  Physical Exam: BP (!) 153/65 (BP Location: Left Arm, Patient Position: Sitting)   Pulse 77   Temp 97.9 F (36.6 C) (Oral)   Resp 14   Ht 4' 11.45" (1.51 m)   Wt 157 lb 8 oz (71.4 kg)   SpO2 100%   BMI 31.33 kg/m  General: Alert, oriented, no acute distress. HEENT: Normocephalic, atraumatic, sclera anicteric. Chest: Unlabored breathing on room air. Abdomen: Obese, soft, nontender.  Normoactive bowel sounds.  No masses or hepatosplenomegaly appreciated.  Well-healed scar. Extremities: Grossly normal range of motion.  Warm, well perfused.  Trace edema bilaterally. Skin: No rashes or lesions noted. Lymphatics: No cervical, supraclavicular, or inguinal adenopathy. GU: Normal appearing external genitalia without erythema, excoriation, or lesions.  Speculum exam reveals narrowed vagina, difficult to see with the speculum past approximately 3 cm.  No bleeding or blood noted within the vagina.  Bimanual exam 1 finger reveals vagina is smooth, no nodularity appreciated.  Rectovaginal exam confirms these findings.  Laboratory & Radiologic Studies: Most recent CT scan above  Assessment & Plan: Bonnie Zhang is a 81 y.o. woman with grade 2 endometrioid adenocarcinoma presenting at the vaginal cuff after prior hysterectomy for complex atypical hyperplasia who who completed definitive radiation treatment in August 2021.   The patient continues to do very well.  She is NED on exam today.    We reviewed again that given the narrowing at the top of the vagina, continued dilator use is very important.  She has  been diligent about doing this.  She continues to have intermittent very light spotting, only with dilator use.  I do not see anything on exam today or feel anything concerning for cancer recurrence.  I suspect that this is all related to radiation changes and atrophy.  This is no longer happening every time she uses her dilator.  She continues to have some pelvic pressure.  She was treated presumptively for infection but was told to stop antibiotic shortly before she finished the course.  Her urine culture ended up showing Enterococcus faecalis.  She was not treated with an antibiotic this would be susceptible to.  While her symptoms have improved, she continues to endorse intermittent pelvic pressure.  Suggested that we plan to treat with ampicillin to see if Enterococcus infection is contributing to the symptoms.  Will also plan to get some pelvic x-rays given recent history of sacral fracture without any sort of trauma.  She will call me back towards the end of the week to let me know if she is feeling better from a symptom standpoint.   Per NCCN surveillance recommendations, we have transition to visits every 6 months that she has more than 2 years out from completing treatment.  We will sinew to alternate visits between my clinic and radiation oncology.  She will see she will see Dr. Sondra Come in 6 months call my office over the summer to schedule a visit to see me in December next year.  She knows to call the clinic if she develops any concerning symptoms before her next scheduled visit.  24 minutes of total time was spent for this patient encounter, including preparation, face-to-face counseling with the patient and coordination of care, and documentation of the encounter.  Jeral Pinch, MD  Division of Gynecologic Oncology  Department of Obstetrics and Gynecology  [facility]

## 2022-03-20 ENCOUNTER — Telehealth: Payer: Self-pay | Admitting: Gynecologic Oncology

## 2022-03-20 NOTE — Telephone Encounter (Signed)
Called patient to update her re xrays. These show severe OA of pubic symphysis. She has some mild improvement of pelvic pressure on antibiotics. I've asked her to call me after she finishes antibiotic with an update.  Jeral Pinch MD Gynecologic Oncology

## 2022-03-28 ENCOUNTER — Ambulatory Visit (INDEPENDENT_AMBULATORY_CARE_PROVIDER_SITE_OTHER): Payer: Medicare Other

## 2022-03-28 DIAGNOSIS — Z86718 Personal history of other venous thrombosis and embolism: Secondary | ICD-10-CM

## 2022-03-28 DIAGNOSIS — Z7901 Long term (current) use of anticoagulants: Secondary | ICD-10-CM

## 2022-03-28 LAB — POCT INR: INR: 2.4 (ref 2.0–3.0)

## 2022-03-28 NOTE — Patient Instructions (Addendum)
Continue 1/2 tablet daily except 1 tablet on Mondays and Thursdays.  Recheck in 6 weeks, on Feb 9 at 10:00.

## 2022-03-28 NOTE — Progress Notes (Signed)
Completed a 5-day course of Ampicillin 500 mg BID on 12/24.  Continue 1/2 tablet daily except 1 tablet on Mondays and Thursdays.  Recheck in 6 weeks.

## 2022-04-03 ENCOUNTER — Ambulatory Visit: Payer: Self-pay | Admitting: Radiation Oncology

## 2022-05-05 ENCOUNTER — Ambulatory Visit: Payer: Medicare Other

## 2022-05-05 ENCOUNTER — Other Ambulatory Visit: Payer: Self-pay

## 2022-05-05 DIAGNOSIS — I441 Atrioventricular block, second degree: Secondary | ICD-10-CM | POA: Diagnosis not present

## 2022-05-05 MED ORDER — SPIRONOLACTONE 25 MG PO TABS: 25.0000 mg | ORAL_TABLET | Freq: Every day | ORAL | 3 refills | Status: DC

## 2022-05-06 LAB — CUP PACEART REMOTE DEVICE CHECK
Battery Voltage: 90
Date Time Interrogation Session: 20240205095146
Implantable Lead Connection Status: 753985
Implantable Lead Connection Status: 753985
Implantable Lead Implant Date: 20230203
Implantable Lead Implant Date: 20230203
Implantable Lead Location: 753859
Implantable Lead Location: 753860
Implantable Lead Model: 377171
Implantable Lead Model: 377171
Implantable Lead Serial Number: 8000600801
Implantable Lead Serial Number: 8000640919
Implantable Pulse Generator Implant Date: 20230203
Pulse Gen Model: 407145
Pulse Gen Serial Number: 70290684

## 2022-05-09 ENCOUNTER — Ambulatory Visit (INDEPENDENT_AMBULATORY_CARE_PROVIDER_SITE_OTHER): Payer: Medicare Other

## 2022-05-09 ENCOUNTER — Other Ambulatory Visit: Payer: Self-pay | Admitting: Internal Medicine

## 2022-05-09 DIAGNOSIS — Z7901 Long term (current) use of anticoagulants: Secondary | ICD-10-CM | POA: Diagnosis not present

## 2022-05-09 LAB — POCT INR: INR: 2 (ref 2.0–3.0)

## 2022-05-09 MED ORDER — WARFARIN SODIUM 5 MG PO TABS: ORAL_TABLET | ORAL | 1 refills | Status: DC

## 2022-05-09 NOTE — Progress Notes (Addendum)
Continue 1/2 tablet daily except 1 tablet on Mondays and Thursdays.  Recheck in 6 weeks.  Pt requested refill of warfarin. Sent in refill.

## 2022-05-09 NOTE — Patient Instructions (Addendum)
Pre visit review using our clinic review tool, if applicable. No additional management support is needed unless otherwise documented below in the visit note.  Continue 1/2 tablet daily except 1 tablet on Mondays and Thursdays.  Recheck in 6 weeks.

## 2022-05-11 ENCOUNTER — Encounter: Payer: Self-pay | Admitting: Internal Medicine

## 2022-05-11 NOTE — Progress Notes (Unsigned)
    Subjective:    Patient ID: Bonnie Zhang, female    DOB: [birth year], 82 y.o.   MRN: [id number]      HPI [place] is here for No chief complaint on file.    Nocturia -     Medications and allergies reviewed with patient and updated if appropriate.  Current Outpatient Medications on File Prior to Visit  Medication Sig Dispense Refill   acetaminophen (TYLENOL) 500 MG tablet Take 500-1,000 mg by mouth every 6 (six) hours as needed for mild pain.     amLODipine (NORVASC) 10 MG tablet TAKE 1 TABLET (10 MG TOTAL) BY MOUTH DAILY. 90 tablet 3   benazepril (LOTENSIN) 40 MG tablet TAKE 1 TABLET BY MOUTH DAILY. 90 tablet 1   cholecalciferol (VITAMIN D3) 25 MCG (1000 UNIT) tablet Take 2,000 Units by mouth daily.     pantoprazole (PROTONIX) 40 MG tablet TAKE 1 TABLET BY MOUTH ONCE DAILY AS NEEDED FOR ACID REFLUX 90 tablet 0   simvastatin (ZOCOR) 10 MG tablet TAKE 1 TABLET BY MOUTH AT BEDTIME. 90 tablet 1   spironolactone (ALDACTONE) 25 MG tablet Take 1 tablet (25 mg total) by mouth daily. 90 tablet 3   vitamin B-12 (CYANOCOBALAMIN) 1000 MCG tablet Take 1,000 mcg by mouth daily.     warfarin (COUMADIN) 5 MG tablet Take 1/2 tablet daily except take 1 tablet on Mon and Thurs or take as directed by anticoagulation clinic 90 tablet 1   No current facility-administered medications on file prior to visit.    Review of Systems     Objective:  There were no vitals filed for this visit. BP Readings from Last 3 Encounters:  03/18/22 (!) 153/65  03/06/22 118/60  02/03/22 (!) 128/56   Wt Readings from Last 3 Encounters:  03/18/22 157 lb 8 oz (71.4 kg)  03/06/22 155 lb (70.3 kg)  02/03/22 154 lb 9.6 oz (70.1 kg)   There is no height or weight on file to calculate BMI.    Physical Exam         Assessment & Plan:    See Problem List for Assessment and Plan of chronic medical problems.

## 2022-05-11 NOTE — Patient Instructions (Signed)
Medications changes include :   Myrbetriq 25 mg daily     Overactive Bladder, Adult  Overactive bladder is a condition in which a person has a sudden and frequent need to urinate. A person might also leak urine if he or she cannot get to the bathroom fast enough (urinary incontinence). Sometimes, symptoms can interfere with work or social activities. What are the causes? Overactive bladder is associated with poor nerve signals between your bladder and your brain. Your bladder may get the signal to empty before it is full. You may also have very sensitive muscles that make your bladder squeeze too soon. This condition may also be caused by other factors, such as: Medical conditions: Urinary tract infection. Infection of nearby tissues. Prostate enlargement. Bladder stones, inflammation, or tumors. Diabetes. Muscle or nerve weakness, especially from these conditions: A spinal cord injury. Stroke. Multiple sclerosis. Parkinson's disease. Other causes: Surgery on the uterus or urethra. Drinking too much caffeine or alcohol. Certain medicines, especially those that eliminate extra fluid in the body (diuretics). Constipation. What increases the risk? You may be at greater risk for overactive bladder if you: Are an older adult. Smoke. Are going through menopause. Have prostate problems. Have a neurological disease, such as stroke, dementia, Parkinson's disease, or multiple sclerosis (MS). Eat or drink alcohol, spicy food, caffeine, and other things that irritate the bladder. Are overweight or obese. What are the signs or symptoms? Symptoms of this condition include a sudden, strong urge to urinate. Other symptoms include: Leaking urine. Urinating 8 or more times a day. Waking up to urinate 2 or more times overnight. How is this diagnosed? This condition may be diagnosed based on: Your symptoms and medical history. A physical exam. Blood or urine tests to check for  possible causes, such as infection. You may also need to see a health care provider who specializes in urinary tract problems. This is called a urologist. How is this treated? Treatment for overactive bladder depends on the cause of your condition and whether it is mild or severe. Treatment may include: Bladder training, such as: Learning to control the urge to urinate by following a schedule to urinate at regular intervals. Doing Kegel exercises to strengthen the pelvic floor muscles that support your bladder. Special devices, such as: Biofeedback. This uses sensors to help you become aware of your body's signals. Electrical stimulation. This uses electrodes placed inside the body (implanted) or outside the body. These electrodes send gentle pulses of electricity to strengthen the nerves or muscles that control the bladder. Women may use a plastic device, called a pessary, that fits into the vagina and supports the bladder. Medicines, such as: Antibiotics to treat bladder infection. Antispasmodics to stop the bladder from releasing urine at the wrong time. Tricyclic antidepressants to relax bladder muscles. Injections of botulinum toxin type A directly into the bladder tissue to relax bladder muscles. Surgery, such as: A device may be implanted to help manage the nerve signals that control urination. An electrode may be implanted to stimulate electrical signals in the bladder. A procedure may be done to change the shape of the bladder. This is done only in very severe cases. Follow these instructions at home: Eating and drinking  Make diet or lifestyle changes recommended by your health care provider. These may include: Drinking fluids throughout the day and not only with meals. Cutting down on caffeine or alcohol. Eating a healthy and balanced diet to prevent constipation. This may include: Choosing  foods that are high in fiber, such as beans, whole grains, and fresh fruits and  vegetables. Limiting foods that are high in fat and processed sugars, such as fried and sweet foods. Lifestyle  Lose weight if needed. Do not use any products that contain nicotine or tobacco. These include cigarettes, chewing tobacco, and vaping devices, such as e-cigarettes. If you need help quitting, ask your health care provider. General instructions Take over-the-counter and prescription medicines only as told by your health care provider. If you were prescribed an antibiotic medicine, take it as told by your health care provider. Do not stop taking the antibiotic even if you start to feel better. Use any implants or pessary as told by your health care provider. If needed, wear pads to absorb urine leakage. Keep a log to track how much and when you drink, and when you need to urinate. This will help your health care provider monitor your condition. Keep all follow-up visits. This is important. Contact a health care provider if: You have a fever or chills. Your symptoms do not get better with treatment. Your pain and discomfort get worse. You have more frequent urges to urinate. Get help right away if: You are not able to control your bladder. Summary Overactive bladder refers to a condition in which a person has a sudden and frequent need to urinate. Several conditions may lead to an overactive bladder. Treatment for overactive bladder depends on the cause and severity of your condition. Making lifestyle changes, doing Kegel exercises, keeping a log, and taking medicines can help with this condition. This information is not intended to replace advice given to you by your health care provider. Make sure you discuss any questions you have with your health care provider. Document Revised: 12/05/2019 Document Reviewed: 12/05/2019 Elsevier Patient Education  [place].

## 2022-05-12 ENCOUNTER — Telehealth: Payer: Self-pay | Admitting: Internal Medicine

## 2022-05-12 ENCOUNTER — Ambulatory Visit (INDEPENDENT_AMBULATORY_CARE_PROVIDER_SITE_OTHER): Payer: Medicare Other | Admitting: Internal Medicine

## 2022-05-12 VITALS — BP 136/80 | HR 65 | Temp 98.0°F | Ht 59.45 in | Wt 158.0 lb

## 2022-05-12 DIAGNOSIS — N3281 Overactive bladder: Secondary | ICD-10-CM

## 2022-05-12 DIAGNOSIS — I10 Essential (primary) hypertension: Secondary | ICD-10-CM

## 2022-05-12 MED ORDER — MIRABEGRON ER 25 MG PO TB24: 25.0000 mg | ORAL_TABLET | Freq: Every day | ORAL | 5 refills | Status: DC

## 2022-05-12 NOTE — Assessment & Plan Note (Signed)
Chronic Symptoms have been going on for a while, but have gotten worse Has nocturia 3-4 times Has urinary urgency and sometimes incontinence No excessive frequency during the day No concerning symptoms for UTI She does stop drinking after 6 PM She would like to try medication to see if that helps-trial of Myrbetriq 25 mg daily if covered.  Discussed most common side effects If medications not covered could consider an older medication-discussed possible concerns with memory issues Myrbetriq 25 mg daily sent to pharmacy

## 2022-05-12 NOTE — Assessment & Plan Note (Signed)
Chronic Blood pressure well-controlled Continue amlodipine 10 mg daily, benazepril 40 mg daily, spironolactone 25 mg daily

## 2022-05-12 NOTE — Telephone Encounter (Signed)
Pt would like for Carla Nurse to give her call back. Pt has questions about medication and when/how to take it.  mirabegron ER (MYRBETRIQ) 25 MG TB24 tablet

## 2022-05-13 NOTE — Telephone Encounter (Signed)
Spoke with patient today. 

## 2022-05-20 ENCOUNTER — Telehealth: Payer: Self-pay | Admitting: Internal Medicine

## 2022-05-20 DIAGNOSIS — N3281 Overactive bladder: Secondary | ICD-10-CM

## 2022-05-20 DIAGNOSIS — R32 Unspecified urinary incontinence: Secondary | ICD-10-CM

## 2022-05-20 NOTE — Telephone Encounter (Signed)
Patient called wanting to give Dr. Quay Burow an update on the new medication she started  mirabegron ER (MYRBETRIQ) 25 MG TB24 tablet. Patient stated that this new medication is causing her to have headaches, high blood pressure, and a fast heart rate. Patient would like to know if Dr. Quay Burow wants to keep her on this medication.   Best callback number for patient is [phone].

## 2022-05-20 NOTE — Telephone Encounter (Signed)
She should stop the medication.  We can try an older medication, but like we discussed there is a slight increased risk of memory issues.  The other option is I can refer her to urology.

## 2022-05-21 NOTE — Telephone Encounter (Signed)
Referral ordered

## 2022-06-13 NOTE — Progress Notes (Signed)
Remote ICD transmission.   

## 2022-06-18 ENCOUNTER — Ambulatory Visit: Payer: Medicare Other | Admitting: Urology

## 2022-06-18 ENCOUNTER — Encounter: Payer: Self-pay | Admitting: Urology

## 2022-06-20 ENCOUNTER — Ambulatory Visit (INDEPENDENT_AMBULATORY_CARE_PROVIDER_SITE_OTHER): Payer: Medicare Other

## 2022-06-20 DIAGNOSIS — Z7901 Long term (current) use of anticoagulants: Secondary | ICD-10-CM

## 2022-06-20 LAB — POCT INR: INR: 2 (ref 2.0–3.0)

## 2022-06-20 NOTE — Progress Notes (Signed)
Continue 1/2 tablet daily except 1 tablet on Mondays and Thursdays.  Re-check in 6 weeks.   

## 2022-06-20 NOTE — Patient Instructions (Addendum)
Pre visit review using our clinic review tool, if applicable. No additional management support is needed unless otherwise documented below in the visit note.  Continue 1/2 tablet daily except 1 tablet on Mondays and Thursdays.  Recheck in 6 weeks. 

## 2022-07-10 ENCOUNTER — Encounter: Payer: Self-pay | Admitting: Urology

## 2022-07-10 ENCOUNTER — Ambulatory Visit: Payer: Medicare Other | Admitting: Urology

## 2022-07-10 VITALS — BP 120/68 | HR 67 | Ht 59.45 in | Wt 156.0 lb

## 2022-07-10 DIAGNOSIS — R3915 Urgency of urination: Secondary | ICD-10-CM | POA: Diagnosis not present

## 2022-07-10 DIAGNOSIS — R3129 Other microscopic hematuria: Secondary | ICD-10-CM | POA: Diagnosis not present

## 2022-07-10 DIAGNOSIS — R351 Nocturia: Secondary | ICD-10-CM | POA: Diagnosis not present

## 2022-07-10 DIAGNOSIS — N393 Stress incontinence (female) (male): Secondary | ICD-10-CM | POA: Diagnosis not present

## 2022-07-10 LAB — MICROSCOPIC EXAMINATION

## 2022-07-10 LAB — URINALYSIS, COMPLETE
Bilirubin, UA: NEGATIVE
Glucose, UA: NEGATIVE
Ketones, UA: NEGATIVE
Nitrite, UA: NEGATIVE
Specific Gravity, UA: 1.02 (ref 1.005–1.030)
Urobilinogen, Ur: 0.2 mg/dL (ref 0.2–1.0)
pH, UA: 5.5 (ref 5.0–7.5)

## 2022-07-10 LAB — BLADDER SCAN AMB NON-IMAGING: Scan Result: 0

## 2022-07-10 MED ORDER — GEMTESA 75 MG PO TABS: 1.0000 | ORAL_TABLET | Freq: Every day | ORAL | 0 refills | Status: DC

## 2022-07-10 NOTE — Progress Notes (Signed)
I, Amy L Pierron,acting as a scribe for Vanna Scotland, MD.,have documented all relevant documentation on the behalf of Vanna Scotland, MD,as directed by  Vanna Scotland, MD while in the presence of Vanna Scotland, MD.  07/10/2022 10:47 AM   Bonnie Zhang Dec 15, 1940 [id number]  Referring provider: Pincus Sanes, MD [address] [place],  [place] [address]  Chief Complaint  Patient presents with   New Patient (Initial Visit)   Over Active Bladder    HPI: 82 year-old female referred for further evaluation of nocturia.   She has nocturia 3-4 times per night. She was started on Myrbetriq by Dr. Lawerance Bach, but is not able to tolerate the medication due to headaches, palpitations, and high blood pressure.  Her urinalysis today has 6-10 white blood cells, 3-10 red blood cells, and moderate bacteria. Will send it for a culture.  She reports her nocturia has been happening about a year but has increased recently. She has leakage when sneezing or coughing. In the morning she has urgency. Denies burning with urination. She had a couple UTI's in the fall of 2023. She only took the Myrbetriq for 5 days and while on it she didn't see any change in urinary symptoms. Besides taking her cholesterol medication before bed with a small sip of water, she ceases drinking anything by 6 pm. She is not sure if she snores since she lives alone.  She denies peripheral edema.  She reports experiencing "flushing" and occasional constipation. She has had a hysterectomy. 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     Results for orders placed or performed in visit on 07/10/22  Bladder Scan (Post Void Residual) in office  Result Value Ref Range   Scan Result 0 ml     PMH: Past Medical History:  Diagnosis Date   Anticoagulated on Coumadin 2001   2 blood clots   Bruises easily    pt is on Coumadin;last dose of Coumadin 08/05/11 and then Lovenox started 08/07/11    DVT (deep vein thrombosis) in pregnancy     1965 post C section;2001 with prolonged driving while on  HRT   DVT (deep venous thrombosis) 2001   Was on Prempro.     Endometrioid adenocarcinoma of uterus    Gallstones    GERD (gastroesophageal reflux disease)    Protonix prn   High cholesterol    takes Simvastating daily   History of colon polyps 2007   Dr Jarold Motto   History of radiation therapy 09/06/2019-11/23/2019   IMRT Vagina/Pelvis and Vagina HDR 4 fx; Dr. Antony Blackbird   Homocysteinemia 01/23/2015   Hx of migraines    yrs ago    Hypertension    takes Benazepril,Amlodipine,and Metoprolol daily   Osteoporosis    PONV (postoperative nausea and vomiting)    Primary localized osteoarthritis of left knee    PVD (peripheral vascular disease) 1965, 2001   abnormal venous doppler findings due to recurrent DVT'S left leg   Right knee DJD    knees   Shortness of breath dyspnea    TIA (transient ischemic attack) 2020   Vitamin B12 deficiency (non anemic) 01/24/2015   Vitamin D deficiency    takes VIt D daily    Surgical History: Past Surgical History:  Procedure Laterality Date   ABDOMINAL HYSTERECTOMY     APPENDECTOMY  04/01/1963   BREAST LUMPECTOMY  03/31/1985   CESAREAN SECTION  1961/1965/1966   X 3  CHOLECYSTECTOMY  04/01/1995   COLONOSCOPY W/ POLYPECTOMY  03/31/2005   Dr  Jarold Motto; due? 2012   DILATION AND CURETTAGE OF UTERUS  03/31/2010   uterine polyp, Dr Shawnie Pons   ESOPHAGOGASTRODUODENOSCOPY  04/01/1995   HYSTEROSCOPY WITH D & C N/A 08/05/2012   Procedure: DILATATION AND CURETTAGE /HYSTEROSCOPY;  Surgeon: Reva Bores, MD;  Location: [facility];  Service: Gynecology;  Laterality: N/A;   PACEMAKER IMPLANT N/A 05/03/2021   Procedure: PACEMAKER IMPLANT;  Surgeon: Marinus Maw, MD;  [facility]: [facility] INVASIVE CV LAB;  Service: Cardiovascular;  Laterality: N/A;   ROBOTIC ASSISTED TOTAL HYSTERECTOMY WITH BILATERAL SALPINGO OOPHERECTOMY Right 09/14/2012   Procedure: ROBOTIC  ASSISTED TOTAL HYSTERECTOMY WITH BILATERAL SALPINGO OOPHORECTOMY ,right pelvic LYMPHNODE dissection;  Surgeon: Rejeana Brock A. Duard Brady, MD;  Location: [facility];  Service: Gynecology;  Laterality: Right;   TOTAL KNEE ARTHROPLASTY  08/11/2011   Procedure: TOTAL KNEE ARTHROPLASTY;  Surgeon: Nilda Simmer, MD;  Location: [facility] OR;  Service: Orthopedics;  Laterality: Right;  DR Anne Ng 90 MINUTES FOR THIS CASE   TOTAL KNEE ARTHROPLASTY Left 09/11/2014   Procedure: TOTAL KNEE ARTHROPLASTY;  Surgeon: Salvatore Marvel, MD;  Location: [facility] OR;  Service: Orthopedics;  Laterality: Left;   WISDOM TOOTH EXTRACTION      Home Medications:  Allergies as of 07/10/2022       Reactions   Myrbetriq [mirabegron]    Headaches, high blood pressure, high heart rate   Vicodin [hydrocodone-acetaminophen]    Nightmares   Aspirin Other (See Comments)   upset stomach   Hctz [hydrochlorothiazide] Other (See Comments)   FATIGUE AND EXTREMELY LOW POTASSIUM   Lipitor [atorvastatin] Other (See Comments)   leg pain        Medication List        Accurate as of July 10, 2022 10:47 AM. If you have any questions, ask your nurse or doctor.          STOP taking these medications    mirabegron ER 25 MG Tb24 tablet Commonly known as: Myrbetriq Stopped by: Vanna Scotland, MD       TAKE these medications    acetaminophen 500 MG tablet Commonly known as: TYLENOL Take 500-1,000 mg by mouth every 6 (six) hours as needed for mild pain.   amLODipine 10 MG tablet Commonly known as: NORVASC TAKE 1 TABLET (10 MG TOTAL) BY MOUTH DAILY.   benazepril 40 MG tablet Commonly known as: LOTENSIN TAKE 1 TABLET BY MOUTH DAILY.   cholecalciferol 25 MCG (1000 UNIT) tablet Commonly known as: VITAMIN D3 Take 2,000 Units by mouth daily.   cyanocobalamin 1000 MCG tablet Commonly known as: VITAMIN B12 Take 1,000 mcg by mouth daily.   Gemtesa 75 MG Tabs Generic drug: Vibegron Take 1 tablet (75 mg total) by mouth daily. Started  by: Vanna Scotland, MD   pantoprazole 40 MG tablet Commonly known as: PROTONIX TAKE 1 TABLET BY MOUTH ONCE DAILY AS NEEDED FOR ACID REFLUX   simvastatin 10 MG tablet Commonly known as: ZOCOR TAKE 1 TABLET BY MOUTH AT BEDTIME.   spironolactone 25 MG tablet Commonly known as: ALDACTONE Take 1 tablet (25 mg total) by mouth daily.   warfarin 5 MG tablet Commonly known as: COUMADIN Take as directed by the anticoagulation clinic. If you are unsure how to take this medication, talk to your nurse or doctor. Original instructions: Take 1/2 tablet daily except take 1 tablet on Mon and Thurs or take as directed by anticoagulation clinic  Allergies:  Allergies  Allergen Reactions   Myrbetriq [Mirabegron]     Headaches, high blood pressure, high heart rate   Vicodin [Hydrocodone-Acetaminophen]     Nightmares   Aspirin Other (See Comments)    upset stomach   Hctz [Hydrochlorothiazide] Other (See Comments)    FATIGUE AND EXTREMELY LOW POTASSIUM   Lipitor [Atorvastatin] Other (See Comments)    leg pain    Family History: Family History  Problem Relation Age of Onset   Kidney disease Mother        ? hypertensive   Hypertension Mother    Deep vein thrombosis Mother        post ankle fracture   Leukemia Father    Hypertension Sister    Liver disease Brother    Colon cancer Maternal Grandmother    Heart failure Maternal Grandfather    Diabetes Other        cousins   Anesthesia problems Neg Hx    Hypotension Neg Hx    Malignant hyperthermia Neg Hx    Pseudochol deficiency Neg Hx    Stroke Neg Hx    Heart disease Neg Hx    Breast cancer Neg Hx    Ovarian cancer Neg Hx    Uterine cancer Neg Hx    Pancreatic cancer Neg Hx    Cancer - Prostate Neg Hx     Social History:  reports that she has never smoked. She has never been exposed to tobacco smoke. She has never used smokeless tobacco. She reports that she does not drink alcohol and does not use drugs.   Physical  Exam: BP 120/68   Pulse 67   Ht 4' 11.45" (1.51 m)   Wt 156 lb (70.8 kg)   BMI 31.03 kg/m   Constitutional:  Alert and oriented, No acute distress. HEENT: [place] AT, moist mucus membranes.  Trachea midline, no masses. Neurologic: Grossly intact, no focal deficits, moving all 4 extremities. Psychiatric: Normal mood and affect.  Assessment & Plan:    Urinary urgency  - She has a component of stress but not particularly bothered by it.  Rare urge incontinence.    2. Nocturia  - We discussed various reasons for the symptoms. She had started to implement behavioral modification. Would recommend that she consider a sleep study facilitated by Dr. Lawerance BachBurns to rule out undiagnosed, untreated sleep apnea. Gave samples of Gemtesa 75 milligrams for six weeks to see if it's effective.   3. Microscopic hematuria/ possible cystitis  -  Will send a urine culture today to rule out an infection.  Because of her history of pelvic radiation it's important to pursue cystoscopy in light of microscopic blood in her urine today and risk factors.   - She's agreeable with this plan. Will reassess her urinary symptoms when she returns for her Cysto in about six weeks.  Return in about 6 weeks (around 08/21/2022) for cysto.  I have reviewed the above documentation for accuracy and completeness, and I agree with the above.   Vanna ScotlandAshley Allie Gerhold, MD  [facility] [address], [address] [place], [place] [address] [phone]

## 2022-07-10 NOTE — Patient Instructions (Signed)

## 2022-07-11 ENCOUNTER — Other Ambulatory Visit: Payer: Self-pay | Admitting: Internal Medicine

## 2022-07-14 LAB — CULTURE, URINE COMPREHENSIVE

## 2022-08-01 ENCOUNTER — Ambulatory Visit: Payer: Medicare Other | Admitting: Physician Assistant

## 2022-08-01 ENCOUNTER — Ambulatory Visit (INDEPENDENT_AMBULATORY_CARE_PROVIDER_SITE_OTHER): Payer: Medicare Other

## 2022-08-01 DIAGNOSIS — Z7901 Long term (current) use of anticoagulants: Secondary | ICD-10-CM | POA: Diagnosis not present

## 2022-08-01 LAB — POCT INR: INR: 2 (ref 2.0–3.0)

## 2022-08-01 NOTE — Progress Notes (Signed)
Continue 1/2 tablet daily except 1 tablet on Mondays and Thursdays.  Re-check in 6 weeks.   

## 2022-08-01 NOTE — Patient Instructions (Addendum)
Pre visit review using our clinic review tool, if applicable. No additional management support is needed unless otherwise documented below in the visit note.  Continue 1/2 tablet daily except 1 tablet on Mondays and Thursdays.  Recheck in 6 weeks. 

## 2022-08-04 ENCOUNTER — Ambulatory Visit: Payer: Medicare Other | Attending: Cardiology | Admitting: Cardiology

## 2022-08-04 ENCOUNTER — Other Ambulatory Visit
Admission: RE | Admit: 2022-08-04 | Discharge: 2022-08-04 | Disposition: A | Discharge status: Home / Self Care | Payer: Medicare Other | Source: Ambulatory Visit | Attending: Cardiology | Admitting: Cardiology

## 2022-08-04 ENCOUNTER — Encounter: Payer: Self-pay | Admitting: Cardiology

## 2022-08-04 ENCOUNTER — Ambulatory Visit (INDEPENDENT_AMBULATORY_CARE_PROVIDER_SITE_OTHER): Payer: Medicare Other

## 2022-08-04 VITALS — BP 124/62 | HR 71 | Ht 60.0 in | Wt 159.4 lb

## 2022-08-04 DIAGNOSIS — I10 Essential (primary) hypertension: Secondary | ICD-10-CM

## 2022-08-04 DIAGNOSIS — R0609 Other forms of dyspnea: Secondary | ICD-10-CM

## 2022-08-04 DIAGNOSIS — I441 Atrioventricular block, second degree: Secondary | ICD-10-CM

## 2022-08-04 DIAGNOSIS — E7800 Pure hypercholesterolemia, unspecified: Secondary | ICD-10-CM

## 2022-08-04 LAB — CUP PACEART REMOTE DEVICE CHECK
Battery Remaining Percentage: 90 %
Brady Statistic RA Percent Paced: 43 %
Brady Statistic RV Percent Paced: 93 %
Date Time Interrogation Session: 20240506111108
Implantable Lead Connection Status: 753985
Implantable Lead Connection Status: 753985
Implantable Lead Implant Date: 20230203
Implantable Lead Implant Date: 20230203
Implantable Lead Location: 753859
Implantable Lead Location: 753860
Implantable Lead Model: 377171
Implantable Lead Model: 377171
Implantable Lead Serial Number: 8000600801
Implantable Lead Serial Number: 8000640919
Implantable Pulse Generator Implant Date: 20230203
Lead Channel Impedance Value: 488 Ohm
Lead Channel Impedance Value: 546 Ohm
Lead Channel Pacing Threshold Amplitude: 1 V
Lead Channel Pacing Threshold Pulse Width: 0.4 ms
Lead Channel Sensing Intrinsic Amplitude: 1 mV
Lead Channel Sensing Intrinsic Amplitude: 11.2 mV
Pulse Gen Model: 407145
Pulse Gen Serial Number: 70290684

## 2022-08-04 LAB — BASIC METABOLIC PANEL
Anion gap: 10 (ref 5–15)
BUN: 30 mg/dL — ABNORMAL HIGH (ref 8–23)
CO2: 23 mmol/L (ref 22–32)
Calcium: 10.1 mg/dL (ref 8.9–10.3)
Chloride: 105 mmol/L (ref 98–111)
Creatinine, Ser: 1.27 mg/dL — ABNORMAL HIGH (ref 0.44–1.00)
GFR, Estimated: 43 mL/min — ABNORMAL LOW (ref >60)
Glucose, Bld: 112 mg/dL — ABNORMAL HIGH (ref 70–99)
Potassium: 4.4 mmol/L (ref 3.5–5.1)
Sodium: 138 mmol/L (ref 135–145)

## 2022-08-04 MED ORDER — METOPROLOL TARTRATE 100 MG PO TABS: 100.0000 mg | ORAL_TABLET | Freq: Once | ORAL | 0 refills | Status: DC

## 2022-08-04 NOTE — Progress Notes (Signed)
Cardiology Office Note:    Date:  08/04/2022   ID:  Bonnie Zhang, DOB 1940-10-26, MRN [id number]  PCP:  Pincus Sanes, MD   [facility] Providers Cardiologist:  Debbe Odea, MD Electrophysiologist:  Lanier Prude, MD     Referring MD: Pincus Sanes, MD   Chief Complaint  Patient presents with   Follow-up    Patient denies new or acute cardiac problems/concerns today.      History of Present Illness:    Bonnie Zhang is a 82 y.o. 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   Endorses dyspnea on exertion which has been going on for over 6 months.  Denies chest pain.  Also has frequent urination at night, being evaluated by urology.  Plans to follow-up next month with PCP regarding possible sleep apnea.  She's not sure if she snores.  Denies palpitations, dizziness, syncope.     Prior notes Echo 09/2021 EF 60 to 65% Echo 09/2020 EF 60 to 65%, grade 2 diastolic dysfunction. Lexiscan 09/2020, no evidence for ischemia. Echo 2015 normal systolic function, impaired relaxation, EF 55 to 60%   Past Medical History:  Diagnosis Date   Anticoagulated on Coumadin 2001   2 blood clots   Bruises easily    pt is on Coumadin;last dose of Coumadin 08/05/11 and then Lovenox started 08/07/11   DVT (deep vein thrombosis) in pregnancy     1965 post C section;2001 with prolonged driving while on  HRT   DVT (deep venous thrombosis) (HCC) 2001   Was on Prempro.     Endometrioid adenocarcinoma of uterus (HCC)    Gallstones    GERD (gastroesophageal reflux disease)    Protonix prn   High cholesterol    takes Simvastating daily   History of colon polyps 2007   Dr Jarold Motto   History of radiation therapy 09/06/2019-11/23/2019   IMRT Vagina/Pelvis and Vagina HDR 4 fx; Dr. Antony Blackbird   Homocysteinemia 01/23/2015   Hx of migraines    yrs ago    Hypertension     takes Benazepril,Amlodipine,and Metoprolol daily   Osteoporosis    PONV (postoperative nausea and vomiting)    Primary localized osteoarthritis of left knee    PVD (peripheral vascular disease) (HCC) 1965, 2001   abnormal venous doppler findings due to recurrent DVT'S left leg   Right knee DJD    knees   Shortness of breath dyspnea    TIA (transient ischemic attack) 2020   Vitamin B12 deficiency (non anemic) 01/24/2015   Vitamin D deficiency    takes VIt D daily    Past Surgical History:  Procedure Laterality Date   ABDOMINAL HYSTERECTOMY     APPENDECTOMY  04/01/1963   BREAST LUMPECTOMY  03/31/1985   CESAREAN SECTION  1961/1965/1966   X 3   CHOLECYSTECTOMY  04/01/1995   COLONOSCOPY W/ POLYPECTOMY  03/31/2005   Dr  Jarold Motto; due? 2012   DILATION AND CURETTAGE OF UTERUS  03/31/2010   uterine polyp, Dr Shawnie Pons   ESOPHAGOGASTRODUODENOSCOPY  04/01/1995   HYSTEROSCOPY WITH D & C N/A 08/05/2012   Procedure: DILATATION AND CURETTAGE /HYSTEROSCOPY;  Surgeon: Reva Bores, MD;  Location: [facility];  Service: Gynecology;  Laterality: N/A;   PACEMAKER IMPLANT N/A 05/03/2021   Procedure: PACEMAKER IMPLANT;  Surgeon: Marinus Maw, MD;  Location[facility] INVASIVE CV LAB;  Service: Cardiovascular;  Laterality: N/A;   ROBOTIC  ASSISTED TOTAL HYSTERECTOMY WITH BILATERAL SALPINGO OOPHERECTOMY Right 09/14/2012   Procedure: ROBOTIC ASSISTED TOTAL HYSTERECTOMY WITH BILATERAL SALPINGO OOPHORECTOMY ,right pelvic LYMPHNODE dissection;  Surgeon: Rejeana Brock A. Duard Brady, MD;  Location: [facility];  Service: Gynecology;  Laterality: Right;   TOTAL KNEE ARTHROPLASTY  08/11/2011   Procedure: TOTAL KNEE ARTHROPLASTY;  Surgeon: Nilda Simmer, MD;  Location: [facility] OR;  Service: Orthopedics;  Laterality: Right;  DR Anne Ng 90 MINUTES FOR THIS CASE   TOTAL KNEE ARTHROPLASTY Left 09/11/2014   Procedure: TOTAL KNEE ARTHROPLASTY;  Surgeon: Salvatore Marvel, MD;  Location: [facility] OR;  Service: Orthopedics;  Laterality: Left;   WISDOM TOOTH  EXTRACTION      Current Medications: Current Meds  Medication Sig   acetaminophen (TYLENOL) 500 MG tablet Take 500-1,000 mg by mouth every 6 (six) hours as needed for mild pain.   amLODipine (NORVASC) 10 MG tablet TAKE 1 TABLET (10 MG TOTAL) BY MOUTH DAILY.   benazepril (LOTENSIN) 40 MG tablet TAKE 1 TABLET BY MOUTH DAILY.   cholecalciferol (VITAMIN D3) 25 MCG (1000 UNIT) tablet Take 2,000 Units by mouth daily.   metoprolol tartrate (LOPRESSOR) 100 MG tablet Take 1 tablet (100 mg total) by mouth once for 1 dose. TWO HOURS PRIOR TO TEST   pantoprazole (PROTONIX) 40 MG tablet TAKE 1 TABLET BY MOUTH ONCE DAILY AS NEEDED FOR ACID REFLUX   simvastatin (ZOCOR) 10 MG tablet TAKE 1 TABLET BY MOUTH AT BEDTIME.   spironolactone (ALDACTONE) 25 MG tablet Take 1 tablet (25 mg total) by mouth daily.   Vibegron (GEMTESA) 75 MG TABS Take 1 tablet (75 mg total) by mouth daily.   vitamin B-12 (CYANOCOBALAMIN) 1000 MCG tablet Take 1,000 mcg by mouth daily.   warfarin (COUMADIN) 5 MG tablet Take 1/2 tablet daily except take 1 tablet on Mon and Thurs or take as directed by anticoagulation clinic     Allergies:   Myrbetriq [mirabegron], Vicodin [hydrocodone-acetaminophen], Aspirin, Hctz [hydrochlorothiazide], and Lipitor [atorvastatin]   Social History   Socioeconomic History   Marital status: Divorced    Spouse name: Not on file   Number of children: 2   Years of education: Not on file   Highest education level: High school graduate  Occupational History   Occupation: Retired  Tobacco Use   Smoking status: Never    Passive exposure: Never   Smokeless tobacco: Never  Vaping Use   Vaping Use: Never used  Substance and Sexual Activity   Alcohol use: No   Drug use: No   Sexual activity: Not Currently    Birth control/protection: Post-menopausal    Comment: retired. has 2 daughters  Other Topics Concern   Not on file  Social History Narrative   Pt lives alone she has 2 daughter - right handed-  drinks tea, soda sometimes - No regular exercise   Social Determinants of Health   Financial Resource Strain: Low Risk  (03/14/2022)   Overall Financial Resource Strain (CARDIA)    Difficulty of Paying Living Expenses: Not hard at all  Food Insecurity: No Food Insecurity (03/14/2022)   Hunger Vital Sign    Worried About Running Out of Food in the Last Year: Never true    Ran Out of Food in the Last Year: Never true  Transportation Needs: No Transportation Needs (03/14/2022)   PRAPARE - Administrator, Civil Service (Medical): No    Lack of Transportation (Non-Medical): No  Physical Activity: Sufficiently Active (03/14/2022)   Exercise Vital Sign  Days of Exercise per Week: 5 days    Minutes of Exercise per Session: 30 min  Stress: No Stress Concern Present (03/14/2022)   [employer] of Occupational Health - Occupational Stress Questionnaire    Feeling of Stress : Not at all  Social Connections: Moderately Integrated (03/14/2022)   Social Connection and Isolation Panel [NHANES]    Frequency of Communication with Friends and Family: More than three times a week    Frequency of Social Gatherings with Friends and Family: More than three times a week    Attends Religious Services: More than 4 times per year    Active Member of [employer] or Organizations: Yes    Attends Engineer, structural: More than 4 times per year    Marital Status: Divorced     Family History: The patient's family history includes Colon cancer in her maternal grandmother; Deep vein thrombosis in her mother; Diabetes in an other family member; Heart failure in her maternal grandfather; Hypertension in her mother and sister; Kidney disease in her mother; Leukemia in her father; Liver disease in her brother. There is no history of Anesthesia problems, Hypotension, Malignant hyperthermia, Pseudochol deficiency, Stroke, Heart disease, Breast cancer, Ovarian cancer, Uterine cancer, Pancreatic cancer,  or Cancer - Prostate.  ROS:   Please see the history of present illness.     All other systems reviewed and are negative.  EKGs/Labs/Other Studies Reviewed:    The following studies were reviewed today:   EKG:  EKG is ordered today.  EKG shows a sensed V paced rhythm   Recent Labs: 03/06/2022: ALT 9; BUN 18; Creatinine, Ser 1.29; Hemoglobin 13.3; Platelets 214.0; Potassium 4.2; Sodium 139  Recent Lipid Panel    Component Value Date/Time   CHOL 150 03/06/2022 1113   CHOL 178 05/25/2014 1121   TRIG 105.0 03/06/2022 1113   TRIG 138 05/25/2014 1121   HDL 70.40 03/06/2022 1113   HDL 76 05/25/2014 1121   CHOLHDL 2 03/06/2022 1113   VLDL 21.0 03/06/2022 1113   LDLCALC 59 03/06/2022 1113   LDLCALC 74 05/25/2014 1121     Risk Assessment/Calculations:          Physical Exam:    VS:  BP 124/62 (BP Location: Left Arm, Patient Position: Sitting, Cuff Size: Normal)   Pulse 71   Ht 5' (1.524 m)   Wt 159 lb 6.4 oz (72.3 kg)   SpO2 98%   BMI 31.13 kg/m     Wt Readings from Last 3 Encounters:  08/04/22 159 lb 6.4 oz (72.3 kg)  07/10/22 156 lb (70.8 kg)  05/12/22 158 lb (71.7 kg)     GEN:  Well nourished, well developed in no acute distress HEENT: Normal NECK: No JVD; No carotid bruits CARDIAC: RRR, no murmurs, rubs, gallops RESPIRATORY:  Clear to auscultation without rales, wheezing or rhonchi  ABDOMEN: Soft, non-tender, non-distended MUSCULOSKELETAL:  No edema; No deformity  SKIN: Warm and dry NEUROLOGIC:  Alert and oriented x 3 PSYCHIATRIC:  Normal affect   ASSESSMENT:    1. Dyspnea on exertion   2. AV block, Mobitz 2   3. Primary hypertension   4. Pure hypercholesterolemia     PLAN:    In order of problems listed above:  Dyspnea on exertion, this could be an anginal equivalent.  Get coronary CT.  Last echo with normal EF.  Plans to follow-up with PCP for OSA eval as possible etiology. Mobitz 2 AV block s/p PPM, 05/03/2021.  EKG showing a sensed  V paced  rhythm.  Echo 10/2021 EF 60 to 65%.  Continue pacemaker checks with device clinic. Hypertension, BP controlled.  Continue Aldactone to 25 mg daily, Norvasc, benazepril Hyperlipidemia, cholesterol controlled.  Continue simvastatin.  Follow-up yearly or earlier if significant obstruction noted on coronary CT.  Medication Adjustments/Labs and Tests Ordered: Current medicines are reviewed at length with the patient today.  Concerns regarding medicines are outlined above.  Orders Placed This Encounter  Procedures   CT CORONARY MORPH W/CTA COR W/SCORE W/CA W/CM &/OR WO/CM   Basic Metabolic Panel (BMET)   EKG 12-Lead    Meds ordered this encounter  Medications   metoprolol tartrate (LOPRESSOR) 100 MG tablet    Sig: Take 1 tablet (100 mg total) by mouth once for 1 dose. TWO HOURS PRIOR TO TEST    Dispense:  1 tablet    Refill:  0      Patient Instructions  Medication Instructions:   Your physician recommends that you continue on your current medications as directed. Please refer to the Current Medication list given to you today.  *If you need a refill on your cardiac medications before your next appointment, please call your pharmacy*   Lab Work:  Your physician recommends you go to the medical mall and have labs drawn.   If you have labs (blood work) drawn today and your tests are completely normal, you will receive your results only by: MyChart Message (if you have MyChart) OR A paper copy in the mail If you have any lab test that is abnormal or we need to change your treatment, we will call you to review the results.   Testing/Procedures:    Your cardiac CT will be scheduled at:  [facility] [address] [place], [place] [address] [phone]   If scheduled at [facility], please arrive 15 mins early for check-in and test prep.   Please follow these instructions carefully (unless otherwise directed):   On the  Night Before the Test: Be sure to Drink plenty of water. Do not consume any caffeinated/decaffeinated beverages or chocolate 12 hours prior to your test. Do not take any antihistamines 12 hours prior to your test.  On the Day of the Test: Drink plenty of water until 1 hour prior to the test. Do not eat any food 1 hour prior to test. Take metoprolol (Lopressor) two hours prior to test. Spironolactone, please HOLD on the morning of the test. FEMALES- please wear underwire-free bra if available, avoid dresses & tight clothing       After the Test: Drink plenty of water. After receiving IV contrast, you may experience a mild flushed feeling. This is normal. On occasion, you may experience a mild rash up to 24 hours after the test. This is not dangerous. If this occurs, you can take Benadryl 25 mg and increase your fluid intake. If you experience trouble breathing, this can be serious. If it is severe call 911 IMMEDIATELY. If it is mild, please call our office.  We will call to schedule your test 2-4 weeks out understanding that some insurance companies will need an authorization prior to the service being performed.   For non-scheduling related questions, please contact the cardiac imaging nurse navigator should you have any questions/concerns: [name], Cardiac Imaging Nurse Navigator Larey Brick, Cardiac Imaging Nurse Navigator [facility] Direct Office Dial: [phone]   For scheduling needs, including cancellations and rescheduling, please call [place], [phone].  Follow-Up: At [facility], you and your health needs are our priority.  As part of our continuing mission to provide you with exceptional heart care, we have created designated Provider Care Teams.  These Care Teams include your primary Cardiologist (physician) and Advanced Practice Providers (APPs -  Physician Assistants and Nurse Practitioners) who all work together to  provide you with the care you need, when you need it.  We recommend signing up for the patient portal called "MyChart".  Sign up information is provided on this After Visit Summary.  MyChart is used to connect with patients for Virtual Visits (Telemedicine).  Patients are able to view lab/test results, encounter notes, upcoming appointments, etc.  Non-urgent messages can be sent to your provider as well.   To learn more about what you can do with MyChart, go to [email or web address].    Your next appointment:   1 year(s)  Provider:   You may see Debbe Odea, MD or one of the following Advanced Practice Providers on your designated Care Team:   Nicolasa Ducking, NP Eula Listen, PA-C Cadence Fransico Michael, PA-C Charlsie Quest, NP   Signed, Debbe Odea, MD  08/04/2022 9:58 AM    [facility]

## 2022-08-04 NOTE — Patient Instructions (Signed)
Medication Instructions:   Your physician recommends that you continue on your current medications as directed. Please refer to the Current Medication list given to you today.  *If you need a refill on your cardiac medications before your next appointment, please call your pharmacy*   Lab Work:  Your physician recommends you go to the medical mall and have labs drawn.   If you have labs (blood work) drawn today and your tests are completely normal, you will receive your results only by: MyChart Message (if you have MyChart) OR A paper copy in the mail If you have any lab test that is abnormal or we need to change your treatment, we will call you to review the results.   Testing/Procedures:    Your cardiac CT will be scheduled at:  [facility] [address] [place], [place] [address] [id number]   If scheduled at [facility], please arrive 15 mins early for check-in and test prep.   Please follow these instructions carefully (unless otherwise directed):   On the Night Before the Test: Be sure to Drink plenty of water. Do not consume any caffeinated/decaffeinated beverages or chocolate 12 hours prior to your test. Do not take any antihistamines 12 hours prior to your test.  On the Day of the Test: Drink plenty of water until 1 hour prior to the test. Do not eat any food 1 hour prior to test. Take metoprolol (Lopressor) two hours prior to test. Spironolactone, please HOLD on the morning of the test. FEMALES- please wear underwire-free bra if available, avoid dresses & tight clothing       After the Test: Drink plenty of water. After receiving IV contrast, you may experience a mild flushed feeling. This is normal. On occasion, you may experience a mild rash up to 24 hours after the test. This is not dangerous. If this occurs, you can take Benadryl 25 mg and increase your fluid intake. If you experience trouble breathing,  this can be serious. If it is severe call 911 IMMEDIATELY. If it is mild, please call our office.  We will call to schedule your test 2-4 weeks out understanding that some insurance companies will need an authorization prior to the service being performed.   For non-scheduling related questions, please contact the cardiac imaging nurse navigator should you have any questions/concerns: [name], Cardiac Imaging Nurse Navigator Larey Brick, Cardiac Imaging Nurse Navigator [facility] Direct Office Dial: [phone]   For scheduling needs, including cancellations and rescheduling, please call [place], [phone].    Follow-Up: At [facility], you and your health needs are our priority.  As part of our continuing mission to provide you with exceptional heart care, we have created designated Provider Care Teams.  These Care Teams include your primary Cardiologist (physician) and Advanced Practice Providers (APPs -  Physician Assistants and Nurse Practitioners) who all work together to provide you with the care you need, when you need it.  We recommend signing up for the patient portal called "MyChart".  Sign up information is provided on this After Visit Summary.  MyChart is used to connect with patients for Virtual Visits (Telemedicine).  Patients are able to view lab/test results, encounter notes, upcoming appointments, etc.  Non-urgent messages can be sent to your provider as well.   To learn more about what you can do with MyChart, go to [email or web address].    Your next appointment:   1 year(s)  Provider:  You may see Debbe Odea, MD or one of the following Advanced Practice Providers on your designated Care Team:   Nicolasa Ducking, NP Eula Listen, PA-C Cadence Fransico Michael, PA-C Charlsie Quest, NP

## 2022-08-20 ENCOUNTER — Ambulatory Visit: Payer: Medicare Other | Admitting: Urology

## 2022-08-20 VITALS — BP 132/75 | HR 71 | Ht 60.0 in | Wt 160.5 lb

## 2022-08-20 DIAGNOSIS — R3129 Other microscopic hematuria: Secondary | ICD-10-CM

## 2022-08-20 DIAGNOSIS — R3915 Urgency of urination: Secondary | ICD-10-CM

## 2022-08-20 DIAGNOSIS — N3040 Irradiation cystitis without hematuria: Secondary | ICD-10-CM

## 2022-08-20 DIAGNOSIS — Q6479 Other congenital malformations of bladder and urethra: Secondary | ICD-10-CM | POA: Diagnosis not present

## 2022-08-20 MED ORDER — TROSPIUM CHLORIDE 20 MG PO TABS
20.0000 mg | ORAL_TABLET | Freq: Two times a day (BID) | ORAL | 2 refills | Status: DC
Start: 2022-08-20 — End: 2022-09-03

## 2022-08-20 NOTE — Progress Notes (Signed)
   08/20/22  CC:  Chief Complaint  Patient presents with   Cysto    HPI: 82 year old female with severe OAB, microscopic hematuria as well as history of pelvic radiation who presents today for cystoscopy.  In the interim, she has tried Singapore for a month without any improvement in her urinary symptoms.  Blood pressure 132/75, pulse 71, height 5' (1.524 m), weight 160 lb 8 oz (72.8 kg). NED. A&Ox3.   No respiratory distress   Abd soft, NT, ND Normal external genitalia, significant vaginal stenosis with very difficult meatus to locate, ultimately able to place the scope blindly into the urethra.  Cystoscopy Procedure Note  Patient identification was confirmed, informed consent was obtained, and patient was prepped using Betadine solution.  Lidocaine jelly was administered per urethral meatus.    Procedure: - Flexible cystoscope introduced, without any difficulty.   - Thorough search of the bladder revealed:    normal urethral meatus    Slightly erythematous hypervascular urothelium around the bladder neck and trigone with relative sparing of the remainder of the bladder consistent with probable radiation changes    no stones    no ulcers     no tumors    no urethral polyps    no trabeculation  - Ureteral orifices were normal in position and appearance.  Post-Procedure: - Patient tolerated the procedure well  Assessment/ Plan:  1. 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 Radiation cystitis As above  3. Urinary urgency No improvement with beta 3 agonist Gemtesa, also failed Myrbetriq secondary to hypertension  Will try Sanctura today, if fails this, could consider second line including Botox.  She was given information about this today as  well. - trospium (SANCTURA) 20 MG tablet; Take 1 tablet (20 mg total) by mouth 2 (two) times daily.  Dispense: 60 tablet; Refill: 2  F/u with PA in 1 mo  Vanna Scotland, MD

## 2022-08-22 ENCOUNTER — Telehealth: Payer: Self-pay | Admitting: *Deleted

## 2022-08-22 NOTE — Telephone Encounter (Signed)
CALLED PATIENT TO ALTER FU APPT. ON 09-08-22 DUE TO DR. KINARD BEING ON VACATION, RESCHEDULED FOR 09-29-22 @ 10:30 AM, LVM FOR A RETURN CALL

## 2022-08-27 ENCOUNTER — Telehealth: Payer: Self-pay

## 2022-08-27 NOTE — Telephone Encounter (Signed)
1514 Return call to pt. Regarding medication issue. LM on voicemail

## 2022-08-27 NOTE — Telephone Encounter (Signed)
No she has already failed Singapore and Myrbetriq.  She can continue to discuss her options when she sees Sam in follow-up.  Vanna Scotland, MD

## 2022-08-27 NOTE — Telephone Encounter (Signed)
Return call to pt  regarding medication problem.  Pt states Sanctura prescribed by Dr. Apolinar Junes 08/20/22 is making her dizzy and off balance.  States she can't take the medication.  She has a follow up appt to see Sam 09/18/22 to recheck her symptoms. Does her medication need to be changed?

## 2022-08-27 NOTE — Telephone Encounter (Signed)
LM  on triage line stating medication Dr. Apolinar Junes prescribed for her 08/20/22 is making her dizzy and she can't take it.

## 2022-08-27 NOTE — Telephone Encounter (Signed)
LM on triage line calling nurse back.

## 2022-08-28 NOTE — Telephone Encounter (Signed)
Call placed to pt regarding Sanctura.  Pt informed per Dr. Apolinar Junes that she does not need any additonal medication sent in at this time.  Instructed pt to keep her follow up appt with Hilton Sinclair, PA-C 09/18/22 to discuss treatment options.  Pt verbalized understanding.

## 2022-08-29 NOTE — Progress Notes (Signed)
Remote ICD transmission.   

## 2022-08-31 NOTE — Progress Notes (Unsigned)
  Electrophysiology Office Follow up Visit Note:    Date:  09/03/2022   ID:  Bonnie Zhang, DOB [birth year], MRN [id number]  PCP:  Pincus Sanes, MD  [facility] Cardiologist:  Debbe Odea, MD  [facility] Electrophysiologist:  Lanier Prude, MD    Interval History:    Bonnie Zhang is a 82 y.o. female who presents for a follow up visit.   I last saw the patient Aug 21, 2021.  She has a permanent pacemaker that was implanted May 03, 2021 by Dr. Ladona Ridgel.  At the last appointment her device was functioning appropriately and we planned for 1 year follow-up.       Past medical, surgical, social and family history were reviewed.  ROS:   Please see the history of present illness.    All other systems reviewed and are negative.  EKGs/Labs/Other Studies Reviewed:    The following studies were reviewed today:  September 03, 2022 in clinic device interrogation personally reviewed Battery longevity 7 years, 9 months Lead parameter stable Intermittent complete heart block V pacing 93% Atrial pacing 43% No changes made today  EKG:  The ekg ordered today demonstrates a sensed, V paced.  Paced QRS 96 ms   Physical Exam:    VS:  BP (!) 112/56   Pulse 80   Ht 5' (1.524 m)   Wt 160 lb (72.6 kg)   SpO2 98%   BMI 31.25 kg/m     Wt Readings from Last 3 Encounters:  09/03/22 160 lb (72.6 kg)  08/20/22 160 lb 8 oz (72.8 kg)  08/04/22 159 lb 6.4 oz (72.3 kg)     GEN:  Well nourished, well developed in no acute distress CARDIAC: RRR, no murmurs, rubs, gallops.  Pacemaker pocket well-healed RESPIRATORY:  Clear to auscultation without rales, wheezing or rhonchi       ASSESSMENT:    1. AV block, Mobitz 2   2. Cardiac pacemaker in situ   3. Primary hypertension    PLAN:    In order of problems listed above:  #Mobitz 2 AV block #Symptomatic bradycardia #Pacemaker in situ Device functioning appropriately.  Continue remote  monitoring.  #Hypertension aT goal today.  Recommend checking blood pressures 1-2 times per week at home and recording the values.  Recommend bringing these recordings to the primary care physician.    Follow-up in 1 year.  Signed, Steffanie Dunn, MD, [facility], [facility] 09/03/2022 10:28 AM    Electrophysiology [facility]

## 2022-09-03 ENCOUNTER — Encounter: Payer: Self-pay | Admitting: Cardiology

## 2022-09-03 ENCOUNTER — Telehealth: Payer: Self-pay | Admitting: *Deleted

## 2022-09-03 ENCOUNTER — Ambulatory Visit: Payer: Medicare Other | Attending: Cardiology | Admitting: Cardiology

## 2022-09-03 VITALS — BP 112/56 | HR 80 | Ht 60.0 in | Wt 160.0 lb

## 2022-09-03 DIAGNOSIS — Z950 Presence of cardiac pacemaker: Secondary | ICD-10-CM | POA: Diagnosis not present

## 2022-09-03 DIAGNOSIS — I10 Essential (primary) hypertension: Secondary | ICD-10-CM

## 2022-09-03 DIAGNOSIS — I441 Atrioventricular block, second degree: Secondary | ICD-10-CM | POA: Diagnosis not present

## 2022-09-03 LAB — CUP PACEART INCLINIC DEVICE CHECK
Date Time Interrogation Session: 20240605103207
Implantable Lead Connection Status: 753985
Implantable Lead Connection Status: 753985
Implantable Lead Implant Date: 20230203
Implantable Lead Implant Date: 20230203
Implantable Lead Location: 753859
Implantable Lead Location: 753860
Implantable Lead Model: 377171
Implantable Lead Model: 377171
Implantable Lead Serial Number: 8000600801
Implantable Lead Serial Number: 8000640919
Implantable Pulse Generator Implant Date: 20230203
Pulse Gen Model: 407145
Pulse Gen Serial Number: 70290684

## 2022-09-03 NOTE — Patient Instructions (Signed)
Medication Instructions:  Your physician recommends that you continue on your current medications as directed. Please refer to the Current Medication list given to you today.  *If you need a refill on your cardiac medications before your next appointment, please call your pharmacy*  Follow-Up: At [facility], you and your health needs are our priority.  As part of our continuing mission to provide you with exceptional heart care, we have created designated Provider Care Teams.  These Care Teams include your primary Cardiologist (physician) and Advanced Practice Providers (APPs -  Physician Assistants and Nurse Practitioners) who all work together to provide you with the care you need, when you need it.  Your next appointment:   1 year(s)  Provider:   Suzann Riddle, NP  

## 2022-09-03 NOTE — Telephone Encounter (Signed)
Returned patient's phone call, spoke with patient 

## 2022-09-04 ENCOUNTER — Ambulatory Visit: Payer: Medicare Other | Admitting: Internal Medicine

## 2022-09-04 ENCOUNTER — Ambulatory Visit: Payer: Medicare Other | Admitting: Radiation Oncology

## 2022-09-08 ENCOUNTER — Ambulatory Visit: Payer: Medicare Other | Admitting: Radiation Oncology

## 2022-09-09 ENCOUNTER — Telehealth (HOSPITAL_COMMUNITY): Payer: Self-pay | Admitting: Emergency Medicine

## 2022-09-09 NOTE — Telephone Encounter (Signed)
Reaching out to patient to offer assistance regarding upcoming cardiac imaging study; pt verbalizes understanding of appt date/time, parking situation and where to check in, pre-test NPO status and medications ordered, and verified current allergies; name and call back number provided for further questions should they arise Bonnie Alexandria RN Navigator Cardiac Imaging [facility] [phone] office [phone] cell  Holding BP meds, taking 100mg  metoprolol

## 2022-09-10 ENCOUNTER — Encounter: Payer: Self-pay | Admitting: Internal Medicine

## 2022-09-10 ENCOUNTER — Ambulatory Visit
Admission: RE | Admit: 2022-09-10 | Discharge: 2022-09-10 | Disposition: A | Discharge status: Home / Self Care | Payer: Medicare Other | Source: Ambulatory Visit | Attending: Cardiology | Admitting: Cardiology

## 2022-09-10 DIAGNOSIS — N183 Chronic kidney disease, stage 3 unspecified: Secondary | ICD-10-CM | POA: Insufficient documentation

## 2022-09-10 DIAGNOSIS — K449 Diaphragmatic hernia without obstruction or gangrene: Secondary | ICD-10-CM | POA: Insufficient documentation

## 2022-09-10 DIAGNOSIS — R0609 Other forms of dyspnea: Secondary | ICD-10-CM | POA: Diagnosis not present

## 2022-09-10 DIAGNOSIS — I2510 Atherosclerotic heart disease of native coronary artery without angina pectoris: Secondary | ICD-10-CM | POA: Insufficient documentation

## 2022-09-10 MED ORDER — NITROGLYCERIN 0.4 MG SL SUBL
0.8000 mg | SUBLINGUAL_TABLET | Freq: Once | SUBLINGUAL | Status: AC
  Administered 2022-09-10: 0.8 mg via SUBLINGUAL

## 2022-09-10 MED ORDER — IOHEXOL 350 MG/ML SOLN
80.0000 mL | Freq: Once | INTRAVENOUS | Status: AC | PRN
  Administered 2022-09-10: 80 mL via INTRAVENOUS

## 2022-09-10 NOTE — Progress Notes (Signed)
Subjective:    Patient ID: Bonnie Zhang, female    DOB: [birth year], 82 y.o.   MRN: [id number]     HPI Bonnie Zhang is here for follow up of her chronic medical problems.  Doing well   Bladder meds not working - may consider botox  Medications and allergies reviewed with patient and updated if appropriate.  Current Outpatient Medications on File Prior to Visit  Medication Sig Dispense Refill   acetaminophen (TYLENOL) 500 MG tablet Take 500-1,000 mg by mouth every 6 (six) hours as needed for mild pain.     amLODipine (NORVASC) 10 MG tablet TAKE 1 TABLET (10 MG TOTAL) BY MOUTH DAILY. 90 tablet 3   benazepril (LOTENSIN) 40 MG tablet TAKE 1 TABLET BY MOUTH DAILY. 90 tablet 1   cholecalciferol (VITAMIN D3) 25 MCG (1000 UNIT) tablet Take 2,000 Units by mouth daily.     pantoprazole (PROTONIX) 40 MG tablet TAKE 1 TABLET BY MOUTH ONCE DAILY AS NEEDED FOR ACID REFLUX 90 tablet 0   simvastatin (ZOCOR) 10 MG tablet TAKE 1 TABLET BY MOUTH AT BEDTIME. 90 tablet 1   spironolactone (ALDACTONE) 25 MG tablet Take 1 tablet (25 mg total) by mouth daily. 90 tablet 3   vitamin B-12 (CYANOCOBALAMIN) 1000 MCG tablet Take 1,000 mcg by mouth daily.     warfarin (COUMADIN) 5 MG tablet Take 1/2 tablet daily except take 1 tablet on Mon and Thurs or take as directed by anticoagulation clinic 90 tablet 1   No current facility-administered medications on file prior to visit.     Review of Systems  Constitutional:  Negative for fever.  Respiratory:  Positive for shortness of breath (DOE). Negative for cough and wheezing.   Cardiovascular:  Positive for palpitations. Negative for chest pain and leg swelling.  Neurological:  Negative for light-headedness and headaches.       Objective:   Vitals:   09/11/22 0959  BP: 124/68  Pulse: 80  Temp: 98.2 F (36.8 C)  SpO2: 98%   BP Readings from Last 3 Encounters:  09/11/22 124/68  09/10/22 (!) 110/55  09/03/22 (!) 112/56   Wt Readings from  Last 3 Encounters:  09/11/22 160 lb (72.6 kg)  09/03/22 160 lb (72.6 kg)  08/20/22 160 lb 8 oz (72.8 kg)   Body mass index is 31.25 kg/m.    Physical Exam Constitutional:      General: She is not in acute distress.    Appearance: Normal appearance.  HENT:     Head: Normocephalic and atraumatic.  Eyes:     Conjunctiva/sclera: Conjunctivae normal.  Cardiovascular:     Rate and Rhythm: Normal rate and regular rhythm.     Heart sounds: Normal heart sounds.  Pulmonary:     Effort: Pulmonary effort is normal. No respiratory distress.     Breath sounds: Normal breath sounds. No wheezing.  Musculoskeletal:     Cervical back: Neck supple.     Right lower leg: No edema.     Left lower leg: No edema.  Lymphadenopathy:     Cervical: No cervical adenopathy.  Skin:    General: Skin is warm and dry.     Findings: No rash.  Neurological:     Mental Status: She is alert. Mental status is at baseline.  Psychiatric:        Mood and Affect: Mood normal.        Behavior: Behavior normal.        Lab Results  Component Value Date   WBC 5.5 03/06/2022   HGB 13.3 03/06/2022   HCT 39.5 03/06/2022   PLT 214.0 03/06/2022   GLUCOSE 112 (H) 08/04/2022   CHOL 150 03/06/2022   TRIG 105.0 03/06/2022   HDL 70.40 03/06/2022   LDLCALC 59 03/06/2022   ALT 9 03/06/2022   AST 15 03/06/2022   NA 138 08/04/2022   K 4.4 08/04/2022   CL 105 08/04/2022   CREATININE 1.27 (H) 08/04/2022   BUN 30 (H) 08/04/2022   CO2 23 08/04/2022   TSH 1.48 03/08/2020   INR 2.0 08/01/2022   HGBA1C 5.3 03/06/2022     Assessment & Plan:    See Problem List for Assessment and Plan of chronic medical problems.

## 2022-09-10 NOTE — Progress Notes (Signed)
Patient tolerated procedure well. Ambulate w/o difficulty. Denies any lightheadedness or being dizzy. Pt denies any pain at this time. Sitting in chair, pt is encouraged to drink additional water throughout the day and reason explained to patient. Patient verbalized understanding and all questions answered. ABC intact. No further needs at this time. Discharge from procedure area w/o issues.  

## 2022-09-10 NOTE — Patient Instructions (Addendum)
      Blood work was ordered.   The lab is on the first floor.    Medications changes include :   none      Return in about 6 months (around 03/13/2023) for Physical Exam.

## 2022-09-11 ENCOUNTER — Ambulatory Visit (INDEPENDENT_AMBULATORY_CARE_PROVIDER_SITE_OTHER): Payer: Medicare Other | Admitting: Internal Medicine

## 2022-09-11 VITALS — BP 124/68 | HR 80 | Temp 98.2°F | Ht 60.0 in | Wt 160.0 lb

## 2022-09-11 DIAGNOSIS — N1832 Chronic kidney disease, stage 3b: Secondary | ICD-10-CM

## 2022-09-11 DIAGNOSIS — E782 Mixed hyperlipidemia: Secondary | ICD-10-CM

## 2022-09-11 DIAGNOSIS — I10 Essential (primary) hypertension: Secondary | ICD-10-CM | POA: Diagnosis not present

## 2022-09-11 DIAGNOSIS — Z7901 Long term (current) use of anticoagulants: Secondary | ICD-10-CM | POA: Diagnosis not present

## 2022-09-11 DIAGNOSIS — K219 Gastro-esophageal reflux disease without esophagitis: Secondary | ICD-10-CM | POA: Diagnosis not present

## 2022-09-11 DIAGNOSIS — R739 Hyperglycemia, unspecified: Secondary | ICD-10-CM

## 2022-09-11 DIAGNOSIS — D6851 Activated protein C resistance: Secondary | ICD-10-CM | POA: Diagnosis not present

## 2022-09-11 LAB — COMPREHENSIVE METABOLIC PANEL
ALT: 10 U/L (ref 0–35)
AST: 18 U/L (ref 0–37)
Albumin: 4.6 g/dL (ref 3.5–5.2)
Alkaline Phosphatase: 91 U/L (ref 39–117)
BUN: 16 mg/dL (ref 6–23)
CO2: 28 mEq/L (ref 19–32)
Calcium: 9.9 mg/dL (ref 8.4–10.5)
Chloride: 102 mEq/L (ref 96–112)
Creatinine, Ser: 1.05 mg/dL (ref 0.40–1.20)
GFR: 50 mL/min — ABNORMAL LOW (ref >60.00)
Glucose, Bld: 94 mg/dL (ref 70–99)
Potassium: 4.3 mEq/L (ref 3.5–5.1)
Sodium: 138 mEq/L (ref 135–145)
Total Bilirubin: 0.6 mg/dL (ref 0.2–1.2)
Total Protein: 8.2 g/dL (ref 6.0–8.3)

## 2022-09-11 LAB — CBC WITH DIFFERENTIAL/PLATELET
Basophils Absolute: 0 10*3/uL (ref 0.0–0.1)
Basophils Relative: 0.2 % (ref 0.0–3.0)
Eosinophils Absolute: 0.1 10*3/uL (ref 0.0–0.7)
Eosinophils Relative: 1.6 % (ref 0.0–5.0)
HCT: 39.2 % (ref 36.0–46.0)
Hemoglobin: 12.9 g/dL (ref 12.0–15.0)
Lymphocytes Relative: 31.2 % (ref 12.0–46.0)
Lymphs Abs: 1.7 10*3/uL (ref 0.7–4.0)
MCHC: 32.9 g/dL (ref 30.0–36.0)
MCV: 90.3 fl (ref 78.0–100.0)
Monocytes Absolute: 0.5 10*3/uL (ref 0.1–1.0)
Monocytes Relative: 8.4 % (ref 3.0–12.0)
Neutro Abs: 3.2 10*3/uL (ref 1.4–7.7)
Neutrophils Relative %: 58.6 % (ref 43.0–77.0)
Platelets: 204 10*3/uL (ref 150.0–400.0)
RBC: 4.34 Mil/uL (ref 3.87–5.11)
RDW: 13 % (ref 11.5–15.5)
WBC: 5.5 10*3/uL (ref 4.0–10.5)

## 2022-09-11 LAB — LIPID PANEL
Cholesterol: 147 mg/dL (ref 0–200)
HDL: 68.7 mg/dL (ref >39.00)
LDL Cholesterol: 56 mg/dL (ref 0–99)
NonHDL: 78.57
Total CHOL/HDL Ratio: 2
Triglycerides: 114 mg/dL (ref 0.0–149.0)
VLDL: 22.8 mg/dL (ref 0.0–40.0)

## 2022-09-11 NOTE — Assessment & Plan Note (Signed)
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 

## 2022-09-11 NOTE — Assessment & Plan Note (Signed)
Chronic Discussed CKD BP well controlled Sugars - normal Encouraged weight loss Increased water intake, avoid nsaids cmp

## 2022-09-11 NOTE — Assessment & Plan Note (Signed)
Chronic Sugars have been in the normal range

## 2022-09-11 NOTE — Assessment & Plan Note (Signed)
Chronic Blood pressure well-controlled Cmp, cbc Continue amlodipine 10 mg daily, benazepril 40 mg daily, spironolactone 25 mg daily

## 2022-09-11 NOTE — Assessment & Plan Note (Signed)
Chronic GERD controlled Continue pantoprazole 40 mg daily as needed 

## 2022-09-11 NOTE — Assessment & Plan Note (Signed)
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 

## 2022-09-11 NOTE — Assessment & Plan Note (Signed)
Chronic Regular exercise and healthy diet encouraged Check lipid panel, cmp Continue simvastatin 10 mg nightly

## 2022-09-12 ENCOUNTER — Ambulatory Visit (INDEPENDENT_AMBULATORY_CARE_PROVIDER_SITE_OTHER): Payer: Medicare Other

## 2022-09-12 ENCOUNTER — Other Ambulatory Visit: Payer: Self-pay | Admitting: Internal Medicine

## 2022-09-12 DIAGNOSIS — Z7901 Long term (current) use of anticoagulants: Secondary | ICD-10-CM | POA: Diagnosis not present

## 2022-09-12 LAB — POCT INR: INR: 2 (ref 2.0–3.0)

## 2022-09-12 NOTE — Progress Notes (Signed)
Continue 1/2 tablet daily except 1 tablet on Mondays and Thursdays.  Re-check in 6 weeks.   

## 2022-09-12 NOTE — Patient Instructions (Addendum)
Pre visit review using our clinic review tool, if applicable. No additional management support is needed unless otherwise documented below in the visit note.  Continue 1/2 tablet daily except 1 tablet on Mondays and Thursdays.  Recheck in 6 weeks. 

## 2022-09-15 LAB — POCT I-STAT CREATININE: Creatinine, Ser: 1.1 mg/dL — ABNORMAL HIGH (ref 0.44–1.00)

## 2022-09-18 ENCOUNTER — Ambulatory Visit: Payer: Medicare Other | Admitting: Physician Assistant

## 2022-09-18 VITALS — BP 133/76 | HR 71 | Ht 60.0 in | Wt 160.0 lb

## 2022-09-18 DIAGNOSIS — R351 Nocturia: Secondary | ICD-10-CM

## 2022-09-18 DIAGNOSIS — R3129 Other microscopic hematuria: Secondary | ICD-10-CM

## 2022-09-18 NOTE — Progress Notes (Signed)
09/18/2022 9:15 AM   Caesar Bookman 07-Nov-1940 [id number]  CC: Chief Complaint  Patient presents with   Follow-up   HPI: Bonnie Zhang is a 82 y.o. female with PMH severe OAB with mixed incontinence who has failed beta 3 agonists, microscopic hematuria, and pelvic radiation with cystoscopy findings consistent with radiation cystitis in May 2024 who presents today for follow-up.   She was started on trospium by Dr. Apolinar Junes last month after undergoing cystoscopy, however she subsequently called her office to report she was not able to take it due to dizziness.  Today she reports she is hesitant to pursue Botox due to concern for side effects.  She has a cardiac pacemaker.  She has never had a sleep study but is not sure if she would want to use a CPAP.  She is primarily bothered by her nocturia.  PMH: Past Medical History:  Diagnosis Date   Anticoagulated on Coumadin 2001   2 blood clots   Bruises easily    pt is on Coumadin;last dose of Coumadin 08/05/11 and then Lovenox started 08/07/11   DVT (deep vein thrombosis) in pregnancy     1965 post C section;2001 with prolonged driving while on  HRT   DVT (deep venous thrombosis) (HCC) 2001   Was on Prempro.     Endometrioid adenocarcinoma of uterus (HCC)    Gallstones    GERD (gastroesophageal reflux disease)    Protonix prn   High cholesterol    takes Simvastating daily   History of colon polyps 2007   Dr Jarold Motto   History of radiation therapy 09/06/2019-11/23/2019   IMRT Vagina/Pelvis and Vagina HDR 4 fx; Dr. Antony Blackbird   Homocysteinemia 01/23/2015   Hx of migraines    yrs ago    Hypertension    takes Benazepril,Amlodipine,and Metoprolol daily   Osteoporosis    PONV (postoperative nausea and vomiting)    Primary localized osteoarthritis of left knee    PVD (peripheral vascular disease) (HCC) 1965, 2001   abnormal venous doppler findings due to recurrent DVT'S left leg   Right knee DJD    knees   Shortness of  breath dyspnea    TIA (transient ischemic attack) 2020   Vitamin B12 deficiency (non anemic) 01/24/2015   Vitamin D deficiency    takes VIt D daily    Surgical History: Past Surgical History:  Procedure Laterality Date   ABDOMINAL HYSTERECTOMY     APPENDECTOMY  04/01/1963   BREAST LUMPECTOMY  03/31/1985   CESAREAN SECTION  1961/1965/1966   X 3   CHOLECYSTECTOMY  04/01/1995   COLONOSCOPY W/ POLYPECTOMY  03/31/2005   Dr  Jarold Motto; due? 2012   DILATION AND CURETTAGE OF UTERUS  03/31/2010   uterine polyp, Dr Shawnie Pons   ESOPHAGOGASTRODUODENOSCOPY  04/01/1995   HYSTEROSCOPY WITH D & C N/A 08/05/2012   Procedure: DILATATION AND CURETTAGE /HYSTEROSCOPY;  Surgeon: Reva Bores, MD;  Location: [facility];  Service: Gynecology;  Laterality: N/A;   PACEMAKER IMPLANT N/A 05/03/2021   Procedure: PACEMAKER IMPLANT;  Surgeon: Marinus Maw, MD;  Location: [facility] INVASIVE CV LAB;  Service: Cardiovascular;  Laterality: N/A;   ROBOTIC ASSISTED TOTAL HYSTERECTOMY WITH BILATERAL SALPINGO OOPHERECTOMY Right 09/14/2012   Procedure: ROBOTIC ASSISTED TOTAL HYSTERECTOMY WITH BILATERAL SALPINGO OOPHORECTOMY ,right pelvic LYMPHNODE dissection;  Surgeon: Rejeana Brock A. Duard Brady, MD;  Location: [facility];  Service: Gynecology;  Laterality: Right;   TOTAL KNEE ARTHROPLASTY  08/11/2011   Procedure: TOTAL KNEE ARTHROPLASTY;  Surgeon: Nilda Simmer,  MD;  Location: [facility] OR;  Service: Orthopedics;  Laterality: Right;  DR Anne Ng 90 MINUTES FOR THIS CASE   TOTAL KNEE ARTHROPLASTY Left 09/11/2014   Procedure: TOTAL KNEE ARTHROPLASTY;  Surgeon: Salvatore Marvel, MD;  Location: [facility] OR;  Service: Orthopedics;  Laterality: Left;   WISDOM TOOTH EXTRACTION      Home Medications:  Allergies as of 09/18/2022       Reactions   Myrbetriq [mirabegron]    Headaches, high blood pressure, high heart rate   Vicodin [hydrocodone-acetaminophen]    Nightmares   Aspirin Other (See Comments)   upset stomach   Hctz [hydrochlorothiazide] Other (See  Comments)   FATIGUE AND EXTREMELY LOW POTASSIUM   Lipitor [atorvastatin] Other (See Comments)   leg pain        Medication List        Accurate as of September 18, 2022  9:15 AM. If you have any questions, ask your nurse or doctor.          acetaminophen 500 MG tablet Commonly known as: TYLENOL Take 500-1,000 mg by mouth every 6 (six) hours as needed for mild pain.   amLODipine 10 MG tablet Commonly known as: NORVASC TAKE 1 TABLET (10 MG TOTAL) BY MOUTH DAILY.   benazepril 40 MG tablet Commonly known as: LOTENSIN TAKE 1 TABLET BY MOUTH DAILY.   cholecalciferol 25 MCG (1000 UNIT) tablet Commonly known as: VITAMIN D3 Take 2,000 Units by mouth daily.   cyanocobalamin 1000 MCG tablet Commonly known as: VITAMIN B12 Take 1,000 mcg by mouth daily.   pantoprazole 40 MG tablet Commonly known as: PROTONIX TAKE 1 TABLET BY MOUTH ONCE DAILY AS NEEDED FOR ACID REFLUX   simvastatin 10 MG tablet Commonly known as: ZOCOR TAKE 1 TABLET BY MOUTH AT BEDTIME.   spironolactone 25 MG tablet Commonly known as: ALDACTONE Take 1 tablet (25 mg total) by mouth daily.   warfarin 5 MG tablet Commonly known as: COUMADIN Take as directed by the anticoagulation clinic. If you are unsure how to take this medication, talk to your nurse or doctor. Original instructions: Take 1/2 tablet daily except take 1 tablet on Mon and Thurs or take as directed by anticoagulation clinic        Allergies:  Allergies  Allergen Reactions   Myrbetriq [Mirabegron]     Headaches, high blood pressure, high heart rate   Vicodin [Hydrocodone-Acetaminophen]     Nightmares   Aspirin Other (See Comments)    upset stomach   Hctz [Hydrochlorothiazide] Other (See Comments)    FATIGUE AND EXTREMELY LOW POTASSIUM   Lipitor [Atorvastatin] Other (See Comments)    leg pain    Family History: Family History  Problem Relation Age of Onset   Kidney disease Mother        ? hypertensive   Hypertension Mother     Deep vein thrombosis Mother        post ankle fracture   Leukemia Father    Hypertension Sister    Liver disease Brother    Colon cancer Maternal Grandmother    Heart failure Maternal Grandfather    Diabetes Other        cousins   Anesthesia problems Neg Hx    Hypotension Neg Hx    Malignant hyperthermia Neg Hx    Pseudochol deficiency Neg Hx    Stroke Neg Hx    Heart disease Neg Hx    Breast cancer Neg Hx    Ovarian cancer Neg Hx  Uterine cancer Neg Hx    Pancreatic cancer Neg Hx    Cancer - Prostate Neg Hx     Social History:   reports that she has never smoked. She has never been exposed to tobacco smoke. She has never used smokeless tobacco. She reports that she does not drink alcohol and does not use drugs.  Physical Exam: BP 133/76   Pulse 71   Ht 5' (1.524 m)   Wt 160 lb (72.6 kg)   BMI 31.25 kg/m   Constitutional:  Alert and oriented, no acute distress, nontoxic appearing HEENT: [place], AT Cardiovascular: No clubbing, cyanosis, or edema Respiratory: Normal respiratory effort, no increased work of breathing Skin: No rashes, bruises or suspicious lesions Neurologic: Grossly intact, no focal deficits, moving all 4 extremities Psychiatric: Normal mood and affect  Assessment & Plan:   1. Nocturia We discussed third line therapies for OAB including PTNS, intravesical Botox, and InterStim.  Would plan to obtain cardiac clearance prior to PTNS.  She is unsure if she wants to pursue any of these.  We also discussed the role of undiagnosed OSA and nocturia and I encouraged her to consider pursuing a sleep study with her PCP.  We discussed that with her age I do not recommend trying any other antimuscarinic agents due to the risk for cognitive decline and increased risk for dementia.  She is in agreement.  She will think over her options and let us know if she wishes to pursue any of them.  2. Microscopic hematuria Shared negative urine cytology results.  Return in  about 1 year (around 09/18/2023) for Annual follow-up with UA with Dr. Apolinar Junes.  Carman Ching, PA-C  [facility] [place] [address], [address] [place], [place] [address] [phone]

## 2022-09-28 NOTE — Progress Notes (Signed)
Radiation Oncology         [phone] ________________________________  Name: Bonnie Zhang MRN: [id number]  Date: 09/29/2022  DOB: 1940-11-03  Follow-Up Visit Note  CC: Bonnie Sanes, MD  Bonnie Sanes, MD  No diagnosis found.  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 She was last seen here for follow-up on 01/02/22. Since her last visit, the patient followed up with Dr. Pricilla Zhang on 03/18/22. During which time, the patient endorsed intermittent pelvic pressure that she described as being anteriorly, over her pubic symphysis, and more prominent when changing positions (and standing up). She did note improvement in her symptoms when she saw her primary care provider the week prior. She was also noted to report a minimal pink-tinged color on her vaginal dilator after using it, which she has also reported in the past. She otherwise denied any symptoms concerning for disease recurrence and she was noted as NED on examination.           Dr. Pricilla Zhang recommended ampicillin to determine if an Enterococcus infection was contributing to her symptoms. A pelvic x-ray was also performed during this visit given her recent history of sacral fracture in August 2023.  X-ray showed mild healing sclerosis and subtle lucency related to the known lateral left sacral ala subacute fracture. 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     In more recent history, the patient was referred to urology on 07/10/22 for  evaluation of OAB, nocturia, and several episodes of microscopic hematuria. 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 A urine sample was sent for cytology (in the setting of microscopic hematuria) and came back negative.   She was started on trospium after undergoing cystoscopy. She did not continue taking this for long due to dizziness. Urology as discussed third line therapy options for OAB including PTNS, intravesical Botox, and InterStim. A cardiac scoring CT was recommended prior to considering PTNS. Subsequent cardiac CT on 09/10/22 showed a coronary calcium score of 1.68, and minimal RCA stenosis.    ***   Allergies:  is allergic to myrbetriq [mirabegron], vicodin [hydrocodone-acetaminophen], aspirin, hctz [hydrochlorothiazide], and lipitor [atorvastatin].  Meds: Current Outpatient Medications  Medication Sig Dispense Refill   acetaminophen (TYLENOL) 500 MG tablet Take 500-1,000 mg by mouth every 6 (six) hours as needed for mild pain.     amLODipine (NORVASC) 10 MG tablet TAKE 1 TABLET (10 MG TOTAL) BY MOUTH DAILY. 90 tablet 3   benazepril (LOTENSIN) 40 MG tablet TAKE 1 TABLET BY MOUTH DAILY. 90 tablet 1   cholecalciferol (VITAMIN D3) 25 MCG (1000 UNIT) tablet Take 2,000 Units by mouth daily.     pantoprazole (PROTONIX) 40 MG tablet TAKE 1 TABLET BY MOUTH ONCE DAILY AS NEEDED FOR ACID REFLUX 90 tablet 0   simvastatin (ZOCOR) 10 MG tablet TAKE 1 TABLET BY MOUTH AT BEDTIME. 90 tablet 1   spironolactone (ALDACTONE) 25 MG tablet Take 1 tablet (25 mg total) by mouth daily. 90  tablet 3   vitamin B-12 (CYANOCOBALAMIN) 1000 MCG tablet Take 1,000 mcg by mouth daily.     warfarin (COUMADIN) 5 MG tablet Take 1/2 tablet daily except take 1 tablet on Mon and Thurs or take as directed by anticoagulation clinic 90 tablet 1   No current facility-administered medications for this encounter.     Physical Findings: The patient is in no acute distress. Patient is alert and oriented.  vitals were not taken for this visit. .  No significant changes. Lungs are clear to auscultation bilaterally. Heart has regular rate and rhythm. No palpable cervical, supraclavicular, or axillary adenopathy. Abdomen soft, non-tender, normal bowel sounds.  On pelvic examination the external genitalia were unremarkable. A speculum exam was performed. There are no mucosal lesions noted in the vaginal vault. A Pap smear was obtained of the proximal vagina. On bimanual and rectovaginal examination there were no pelvic masses appreciated. ***   Lab Findings: Lab Results  Component Value Date   WBC 5.5 09/11/2022   HGB 12.9 09/11/2022   HCT 39.2 09/11/2022   MCV 90.3 09/11/2022   PLT 204.0 09/11/2022    Radiographic Findings: CT CORONARY MORPH W/CTA COR W/SCORE W/CA W/CM &/OR WO/CM  Addendum Date: 09/17/2022   ADDENDUM REPORT: 09/17/2022 12:49 EXAM: OVER-READ INTERPRETATION  CT CHEST The following report is an over-read performed by radiologist Dr. Alver Fisher [facility], Zhang on 09/17/2022. This over-read does not include interpretation of cardiac or coronary anatomy or pathology. The interpretation by the cardiologist is attached. COMPARISON:  CT chest 11/21/2020. FINDINGS: Scout view is unremarkable. Moderate hiatal hernia. No acute extracardiac findings. IMPRESSION: Moderate hiatal hernia. Electronically Signed   By: Bonnie Zhang M.D.   On: 09/17/2022 12:49   Result Date: 09/17/2022 CLINICAL DATA:  Dyspnea on exertion EXAM: Cardiac/Coronary  CTA TECHNIQUE: The patient was scanned on a Siemens Somatom go.Top scanner. : A prospective scan was triggered in the ascending thoracic aorta. Axial non-contrast 3 mm slices were carried out through the heart. The data set was analyzed on a dedicated work station and scored using the Agatson method. Gantry rotation speed was 6 msecs and collimation was  .6 mm. 100mg  of metoprolol and 0.8 mg of sl NTG was given. The 3D data set was reconstructed in 5% intervals of the 60-95 % of the R-R cycle. Diastolic phases were analyzed on a dedicated work station using MPR, MIP and VRT modes. The patient received 80 cc of contrast. FINDINGS: Aorta: Normal size. Ascending aorta calcifications. No dissection. Aortic Valve:  Trileaflet.  No calcifications. Coronary Arteries:  Normal coronary origin.  Right dominance. RCA is a dominant artery. There is minimal calcifications proximally causing minimal stenosis (<25%). Left main gives rise to LAD and LCX arteries. LM has no disease. LAD has no plaque. LCX is a non-dominant artery.  There is no plaque. Other findings: Normal pulmonary vein drainage into the left atrium. Normal left atrial appendage without a thrombus. Normal size of the pulmonary artery. IMPRESSION: 1. Coronary calcium score of 1.68. This was 10th percentile for age and sex matched control. 2. Normal coronary origin with right dominance. 3. Minimal RCA stenosis (<25%). 4. CAD-RADS 1. Minimal non-obstructive CAD (0-24%). Consider preventive therapy and risk factor modification. Electronically Signed: By: Debbe Odea M.D. On: 09/10/2022 14:27   CUP PACEART INCLINIC DEVICE CHECK  Result Date: 09/03/2022 Pacemaker check in clinic. Normal device function. Thresholds, sensing, impedances consistent with previous measurements. Device programmed to maximize longevity. No mode switches.  One brief high  ventricular rate noted-12 beats.  Programmed at appropriate safety margins. Histogram distribution appropriate for patient activity level. Device programmed to optimize intrinsic conduction. Estimated longevity 7 years 9 months. Patient enrolled in remote follow-up. 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  ***  Plan:  ***   *** minutes of total time was spent for this patient encounter, including preparation, face-to-face counseling with the patient and coordination of care, physical exam, and documentation of the encounter. ____________________________________  Billie Lade, PhD, MD  This document serves as a record of services personally performed by Antony Blackbird, MD. It was created on his behalf by Neena Rhymes, a trained medical scribe. The creation of this record is based on the scribe's personal observations and the provider's statements to them. This document has been checked and approved by the attending provider.

## 2022-09-29 ENCOUNTER — Ambulatory Visit
Admission: RE | Admit: 2022-09-29 | Discharge: 2022-09-29 | Disposition: A | Discharge status: Home / Self Care | Payer: Medicare Other | Source: Ambulatory Visit | Attending: Radiation Oncology | Admitting: Radiation Oncology

## 2022-09-29 ENCOUNTER — Other Ambulatory Visit: Payer: Self-pay

## 2022-09-29 ENCOUNTER — Encounter: Payer: Self-pay | Admitting: Radiation Oncology

## 2022-09-29 VITALS — BP 130/60 | HR 69 | Temp 97.8°F | Resp 20 | Ht 60.0 in | Wt 162.4 lb

## 2022-09-29 DIAGNOSIS — I2510 Atherosclerotic heart disease of native coronary artery without angina pectoris: Secondary | ICD-10-CM | POA: Diagnosis not present

## 2022-09-29 DIAGNOSIS — Z8542 Personal history of malignant neoplasm of other parts of uterus: Secondary | ICD-10-CM | POA: Diagnosis present

## 2022-09-29 DIAGNOSIS — K449 Diaphragmatic hernia without obstruction or gangrene: Secondary | ICD-10-CM | POA: Insufficient documentation

## 2022-09-29 DIAGNOSIS — I700 Atherosclerosis of aorta: Secondary | ICD-10-CM | POA: Diagnosis not present

## 2022-09-29 DIAGNOSIS — C541 Malignant neoplasm of endometrium: Secondary | ICD-10-CM

## 2022-09-29 DIAGNOSIS — Z923 Personal history of irradiation: Secondary | ICD-10-CM | POA: Insufficient documentation

## 2022-09-29 DIAGNOSIS — Z7901 Long term (current) use of anticoagulants: Secondary | ICD-10-CM | POA: Diagnosis not present

## 2022-09-29 DIAGNOSIS — Z79899 Other long term (current) drug therapy: Secondary | ICD-10-CM | POA: Insufficient documentation

## 2022-09-29 NOTE — Progress Notes (Signed)
Bonnie Zhang is here today for follow up post radiation to the pelvic.  They completed their radiation on: 09-06-19 to 11-23-19   Does the patient complain of any of the following:  Pain:Denies  any pain Abdominal bloating: Denies  Diarrhea/Constipation: denies Nausea/Vomiting: denies Vaginal Discharge: denies Blood in Urine or Stool: denies Urinary Issues (dysuria/incomplete emptying/ incontinence/ increased frequency/urgency): reports some incontinent. Does patient report using vaginal dilator 2-3 times a week and/or sexually active 2-3 weeks: Using as ordered. Post radiation skin changes: None   Additional comments if applicable:  Vitals:   09/29/22 1020  BP: 130/60  Pulse: 69  Resp: 20  Temp: 97.8 F (36.6 C)  SpO2: 100%  Weight: 73.7 kg  Height: 5' (1.524 m)

## 2022-10-24 ENCOUNTER — Ambulatory Visit (INDEPENDENT_AMBULATORY_CARE_PROVIDER_SITE_OTHER): Payer: Medicare Other

## 2022-10-24 DIAGNOSIS — Z7901 Long term (current) use of anticoagulants: Secondary | ICD-10-CM | POA: Diagnosis not present

## 2022-10-24 LAB — POCT INR: INR: 3.8 — AB (ref 2.0–3.0)

## 2022-10-24 NOTE — Progress Notes (Signed)
Pt already took warfarin today. Hold dose tomorrow  and then change weekly dose to take 1/2 tablet daily except 1 tablet on Mondays and Thursdays.  Recheck in 3 weeks.

## 2022-10-24 NOTE — Patient Instructions (Addendum)
Pre visit review using our clinic review tool, if applicable. No additional management support is needed unless otherwise documented below in the visit note.  Hold dose tomorrow  and then change weekly dose to take 1/2 tablet daily except 1 tablet on Mondays and Thursdays.  Recheck in 3 weeks.

## 2022-10-27 ENCOUNTER — Telehealth: Payer: Self-pay | Admitting: *Deleted

## 2022-10-27 ENCOUNTER — Telehealth: Payer: Self-pay | Admitting: Internal Medicine

## 2022-10-27 NOTE — Telephone Encounter (Signed)
Bonnie Zhang states she last saw Dr Barbaraann Cao in 2022. She was to call if she ever had any problems. She is now having " Dull headaches and burning on the top of my head where the growth was". Is requesting to see Dr Barbaraann Cao.

## 2022-10-27 NOTE — Telephone Encounter (Signed)
Patient is aware of upcoming appointment times/dates with provider

## 2022-10-27 NOTE — Telephone Encounter (Signed)
Notified that our schedulers will call her to make an appointment with Dr Barbaraann Cao. To go to the ED is pain gets works. Pt verbalized understanding

## 2022-11-03 ENCOUNTER — Ambulatory Visit (INDEPENDENT_AMBULATORY_CARE_PROVIDER_SITE_OTHER): Payer: Medicare Other

## 2022-11-03 DIAGNOSIS — I441 Atrioventricular block, second degree: Secondary | ICD-10-CM | POA: Diagnosis not present

## 2022-11-05 ENCOUNTER — Telehealth: Payer: Self-pay | Admitting: Internal Medicine

## 2022-11-06 ENCOUNTER — Inpatient Hospital Stay: Payer: Medicare Other | Admitting: Internal Medicine

## 2022-11-11 ENCOUNTER — Ambulatory Visit (INDEPENDENT_AMBULATORY_CARE_PROVIDER_SITE_OTHER): Payer: Medicare Other

## 2022-11-11 DIAGNOSIS — Z7901 Long term (current) use of anticoagulants: Secondary | ICD-10-CM

## 2022-11-11 LAB — POCT INR: INR: 2.5 (ref 2.0–3.0)

## 2022-11-11 NOTE — Patient Instructions (Addendum)
Pre visit review using our clinic review tool, if applicable. No additional management support is needed unless otherwise documented below in the visit note.  Continue 1/2 tablet daily except 1 tablet on Thursdays.  Recheck in 4 weeks.

## 2022-11-11 NOTE — Progress Notes (Addendum)
Continue 1/2 tablet daily except 1 tablet on Thursdays.  Recheck in 4 weeks.

## 2022-11-13 ENCOUNTER — Other Ambulatory Visit: Payer: Self-pay

## 2022-11-13 ENCOUNTER — Inpatient Hospital Stay: Payer: Medicare Other | Attending: Internal Medicine | Admitting: Internal Medicine

## 2022-11-13 VITALS — BP 145/65 | HR 74 | Temp 97.0°F | Resp 14 | Wt 158.5 lb

## 2022-11-13 DIAGNOSIS — R9089 Other abnormal findings on diagnostic imaging of central nervous system: Secondary | ICD-10-CM | POA: Insufficient documentation

## 2022-11-13 DIAGNOSIS — G44209 Tension-type headache, unspecified, not intractable: Secondary | ICD-10-CM | POA: Diagnosis not present

## 2022-11-13 NOTE — Progress Notes (Signed)
Remote ICD transmission.   

## 2022-11-13 NOTE — Progress Notes (Signed)
[facility] [address]  [place], [place] [address] [phone]   Interval Evaluation  Date of Service: 11/13/22 Patient Name: Bonnie Zhang Patient MRN: [id number] Patient DOB: Jul 08, 1940 Provider: Henreitta Leber, MD  Identifying Statement:  Bonnie Zhang is a 82 y.o. female with  left cavernous   mass    Interval History: Bonnie Zhang presents today for clinical follow up.  She describes occasional headaches in the front and along her temples, right more than left.  No associated neurologic symptoms.  Onset was 2-3 months ago, frequency had been 2-3x per week, but none in the past 2 weeks.  Does have a remote history of migraines in prior decades.  Tylenol has been very effective.  Sleep has been more interrupted recently by nocturia.  H+P (08/12/19) Patient presents for evaluation and review of abnormal brain MRI findings.  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  Stroke symptoms were sudden onset of speech impairment which resolved on its own.  Through Dr. Maurice Small, repeat MRI was performed this month which showed no growth or change.  He did not recommend surgery or biopsy.  Medications: Current Outpatient Medications on File Prior to Visit  Medication Sig Dispense Refill   acetaminophen (TYLENOL) 500 MG tablet Take 500-1,000 mg by mouth every 6 (six) hours as needed for mild pain.     amLODipine (NORVASC) 10 MG tablet TAKE 1 TABLET (10 MG TOTAL) BY MOUTH DAILY. 90 tablet 3   benazepril (LOTENSIN) 40 MG tablet TAKE 1 TABLET BY MOUTH DAILY. 90 tablet 1   cholecalciferol (VITAMIN D3) 25 MCG (1000 UNIT) tablet Take 2,000 Units by mouth daily.     pantoprazole (PROTONIX) 40 MG tablet TAKE 1 TABLET BY MOUTH ONCE DAILY AS NEEDED FOR ACID REFLUX 90 tablet 0   simvastatin (ZOCOR) 10 MG tablet TAKE 1 TABLET BY MOUTH AT BEDTIME. 90 tablet 1    spironolactone (ALDACTONE) 25 MG tablet Take 1 tablet (25 mg total) by mouth daily. 90 tablet 3   vitamin B-12 (CYANOCOBALAMIN) 1000 MCG tablet Take 1,000 mcg by mouth daily.     warfarin (COUMADIN) 5 MG tablet Take 1/2 tablet daily except take 1 tablet on Mon and Thurs or take as directed by anticoagulation clinic 90 tablet 1   No current facility-administered medications on file prior to visit.    Allergies:  Allergies  Allergen Reactions   Myrbetriq [Mirabegron]     Headaches, high blood pressure, high heart rate   Vicodin [Hydrocodone-Acetaminophen]     Nightmares   Aspirin Other (See Comments)    upset stomach   Hctz [Hydrochlorothiazide] Other (See Comments)    FATIGUE AND EXTREMELY LOW POTASSIUM   Lipitor [Atorvastatin] Other (See Comments)    leg pain   Past Medical History:  Past Medical History:  Diagnosis Date   Anticoagulated on Coumadin 2001   2 blood clots   Bruises easily    pt is on Coumadin;last dose of Coumadin 08/05/11 and then Lovenox started 08/07/11   DVT (deep vein thrombosis) in pregnancy     1965 post C section;2001 with prolonged driving while on  HRT   DVT (deep venous thrombosis) (HCC) 2001   Was on Prempro.     Endometrioid adenocarcinoma of uterus (HCC)    Gallstones    GERD (gastroesophageal reflux disease)    Protonix prn   High cholesterol  takes Simvastating daily   History of colon polyps 2007   Dr Jarold Motto   History of radiation therapy 09/06/2019-11/23/2019   IMRT Vagina/Pelvis and Vagina HDR 4 fx; Dr. Antony Blackbird   Homocysteinemia 01/23/2015   Hx of migraines    yrs ago    Hypertension    takes Benazepril,Amlodipine,and Metoprolol daily   Osteoporosis    PONV (postoperative nausea and vomiting)    Primary localized osteoarthritis of left knee    PVD (peripheral vascular disease) (HCC) 1965, 2001   abnormal venous doppler findings due to recurrent DVT'S left leg   Right knee DJD    knees   Shortness of breath dyspnea    TIA  (transient ischemic attack) 2020   Vitamin B12 deficiency (non anemic) 01/24/2015   Vitamin D deficiency    takes VIt D daily   Past Surgical History:  Past Surgical History:  Procedure Laterality Date   ABDOMINAL HYSTERECTOMY     APPENDECTOMY  04/01/1963   BREAST LUMPECTOMY  03/31/1985   CESAREAN SECTION  1961/1965/1966   X 3   CHOLECYSTECTOMY  04/01/1995   COLONOSCOPY W/ POLYPECTOMY  03/31/2005   Dr  Jarold Motto; due? 2012   DILATION AND CURETTAGE OF UTERUS  03/31/2010   uterine polyp, Dr Shawnie Pons   ESOPHAGOGASTRODUODENOSCOPY  04/01/1995   HYSTEROSCOPY WITH D & C N/A 08/05/2012   Procedure: DILATATION AND CURETTAGE /HYSTEROSCOPY;  Surgeon: Reva Bores, MD;  Location: [facility];  Service: Gynecology;  Laterality: N/A;   PACEMAKER IMPLANT N/A 05/03/2021   Procedure: PACEMAKER IMPLANT;  Surgeon: Marinus Maw, MD;  Location: [facility] INVASIVE CV LAB;  Service: Cardiovascular;  Laterality: N/A;   ROBOTIC ASSISTED TOTAL HYSTERECTOMY WITH BILATERAL SALPINGO OOPHERECTOMY Right 09/14/2012   Procedure: ROBOTIC ASSISTED TOTAL HYSTERECTOMY WITH BILATERAL SALPINGO OOPHORECTOMY ,right pelvic LYMPHNODE dissection;  Surgeon: Rejeana Brock A. Duard Brady, MD;  Location: [facility];  Service: Gynecology;  Laterality: Right;   TOTAL KNEE ARTHROPLASTY  08/11/2011   Procedure: TOTAL KNEE ARTHROPLASTY;  Surgeon: Nilda Simmer, MD;  Location: [facility] OR;  Service: Orthopedics;  Laterality: Right;  DR Anne Ng 90 MINUTES FOR THIS CASE   TOTAL KNEE ARTHROPLASTY Left 09/11/2014   Procedure: TOTAL KNEE ARTHROPLASTY;  Surgeon: Salvatore Marvel, MD;  Location: [facility];  Service: Orthopedics;  Laterality: Left;   WISDOM TOOTH EXTRACTION     Social History:  Social History   Socioeconomic History   Marital status: Divorced    Spouse name: Not on file   Number of children: 2   Years of education: Not on file   Highest education level: High school graduate  Occupational History   Occupation: Retired  Tobacco Use   Smoking status: Never     Passive exposure: Never   Smokeless tobacco: Never  Vaping Use   Vaping status: Never Used  Substance and Sexual Activity   Alcohol use: No   Drug use: No   Sexual activity: Not Currently    Birth control/protection: Post-menopausal    Comment: retired. has 2 daughters  Other Topics Concern   Not on file  Social History Narrative   Pt lives alone she has 2 daughter - right handed- drinks tea, soda sometimes - No regular exercise   Social Determinants of Health   Financial Resource Strain: Low Risk  (03/14/2022)   Overall Financial Resource Strain (CARDIA)    Difficulty of Paying Living Expenses: Not hard at all  Food Insecurity: No Food Insecurity (03/14/2022)   Hunger Vital Sign    Worried  About Running Out of Food in the Last Year: Never true    Ran Out of Food in the Last Year: Never true  Transportation Needs: No Transportation Needs (03/14/2022)   PRAPARE - Administrator, Civil Service (Medical): No    Lack of Transportation (Non-Medical): No  Physical Activity: Sufficiently Active (03/14/2022)   Exercise Vital Sign    Days of Exercise per Week: 5 days    Minutes of Exercise per Session: 30 min  Stress: No Stress Concern Present (03/14/2022)   [employer] of Occupational Health - Occupational Stress Questionnaire    Feeling of Stress : Not at all  Social Connections: Moderately Integrated (03/14/2022)   Social Connection and Isolation Panel [NHANES]    Frequency of Communication with Friends and Family: More than three times a week    Frequency of Social Gatherings with Friends and Family: More than three times a week    Attends Religious Services: More than 4 times per year    Active Member of [employer] or Organizations: Yes    Attends Engineer, structural: More than 4 times per year    Marital Status: Divorced  Intimate Partner Violence: Not At Risk (03/14/2022)   Humiliation, Afraid, Rape, and Kick questionnaire    Fear of Current or  Ex-Partner: No    Emotionally Abused: No    Physically Abused: No    Sexually Abused: No   Family History:  Family History  Problem Relation Age of Onset   Kidney disease Mother        ? hypertensive   Hypertension Mother    Deep vein thrombosis Mother        post ankle fracture   Leukemia Father    Hypertension Sister    Liver disease Brother    Colon cancer Maternal Grandmother    Heart failure Maternal Grandfather    Diabetes Other        cousins   Anesthesia problems Neg Hx    Hypotension Neg Hx    Malignant hyperthermia Neg Hx    Pseudochol deficiency Neg Hx    Stroke Neg Hx    Heart disease Neg Hx    Breast cancer Neg Hx    Ovarian cancer Neg Hx    Uterine cancer Neg Hx    Pancreatic cancer Neg Hx    Cancer - Prostate Neg Hx     Review of Systems: Constitutional: Doesn't report fevers, chills or abnormal weight loss Eyes: Doesn't report blurriness of vision Ears, nose, mouth, throat, and face: Doesn't report sore throat Respiratory: Doesn't report cough, dyspnea or wheezes Cardiovascular: Doesn't report palpitation, chest discomfort  Gastrointestinal:  Doesn't report nausea, constipation, diarrhea GU: Doesn't report incontinence Skin: Doesn't report skin rashes Neurological: Per HPI Musculoskeletal: Doesn't report joint pain Behavioral/Psych: Doesn't report anxiety  Physical Exam: Vitals:   11/13/22 1233  BP: (!) 145/65  Pulse: 74  Resp: 14  Temp: (!) 97 F (36.1 C)  SpO2: 97%   KPS: 80. General: Alert, cooperative, pleasant, in no acute distress Head: Normal EENT: No conjunctival injection or scleral icterus.  Lungs: Resp effort normal Cardiac: Regular rate Abdomen: Non-distended abdomen Skin: No rashes cyanosis or petechiae. Extremities: No clubbing or edema  Neurologic Exam: Mental Status: Awake, alert, attentive to examiner. Oriented to self and environment. Language is fluent with intact comprehension.  Cranial Nerves: Visual acuity is  grossly normal. Visual fields are full. Extra-ocular movements intact. No ptosis. Face is symmetric Motor: Tone and bulk  are normal. Power is full in both arms and legs. Reflexes are symmetric, no pathologic reflexes present.  Sensory: Intact to light touch Gait: Normal.   Labs: I have reviewed the data as listed    Component Value Date/Time   NA 138 09/11/2022 1058   K 4.3 09/11/2022 1058   CL 102 09/11/2022 1058   CO2 28 09/11/2022 1058   GLUCOSE 94 09/11/2022 1058   BUN 16 09/11/2022 1058   CREATININE 1.05 09/11/2022 1058   CREATININE 1.23 (H) 03/01/2021 1340   CALCIUM 9.9 09/11/2022 1058   PROT 8.2 09/11/2022 1058   ALBUMIN 4.6 09/11/2022 1058   AST 18 09/11/2022 1058   ALT 10 09/11/2022 1058   ALKPHOS 91 09/11/2022 1058   BILITOT 0.6 09/11/2022 1058   GFRNONAA 43 (L) 08/04/2022 0943   GFRNONAA 45 (L) 03/01/2021 1340   GFRAA >60 08/18/2019 1001   Lab Results  Component Value Date   WBC 5.5 09/11/2022   NEUTROABS 3.2 09/11/2022   HGB 12.9 09/11/2022   HCT 39.2 09/11/2022   MCV 90.3 09/11/2022   PLT 204.0 09/11/2022    Assessment/Plan Cavernous Sinus Mass  Bonnie Zhang presents today with tension type headaches.  We do no suspect these are directly related to her cavernous sinus lesion, as they are non-focal in nature.  More likely they have been triggered by chronic sleep issues.  We provided some counsling on sleep hygiene.  Ok with PRN Tylenol.  Will defer MRI brain unless symptoms worsen or become focal.  All questions were answered. The patient knows to call the clinic with any problems, questions or concerns. No barriers to learning were detected.  The total time spent in the encounter was 40 minutes and more than 50% was on counseling and review of test results   Henreitta Leber, MD Medical Director of Neuro-Oncology [facility] at [facility] 11/13/22 12:37 PM

## 2022-12-09 ENCOUNTER — Ambulatory Visit (INDEPENDENT_AMBULATORY_CARE_PROVIDER_SITE_OTHER): Payer: Medicare Other

## 2022-12-09 DIAGNOSIS — Z7901 Long term (current) use of anticoagulants: Secondary | ICD-10-CM

## 2022-12-09 LAB — POCT INR: INR: 1.8 — AB (ref 2.0–3.0)

## 2022-12-09 NOTE — Patient Instructions (Addendum)
Pre visit review using our clinic review tool, if applicable. No additional management support is needed unless otherwise documented below in the visit note.  Increase dose today to take 1 tablet today and then continue 1/2 tablet daily except 1 tablet on Thursdays.  Recheck in 3 weeks.

## 2022-12-09 NOTE — Progress Notes (Signed)
Increase dose today to take 1 tablet today and then continue 1/2 tablet daily except 1 tablet on Thursdays.  Recheck in 3 weeks.

## 2022-12-29 ENCOUNTER — Telehealth: Payer: Self-pay

## 2022-12-29 NOTE — Telephone Encounter (Signed)
Dr. Denyse Amass recommended Prolia for pt 10/2021, does not appear that therapy has been started. Reach out to pt.

## 2022-12-30 ENCOUNTER — Ambulatory Visit (INDEPENDENT_AMBULATORY_CARE_PROVIDER_SITE_OTHER): Payer: Medicare Other

## 2022-12-30 DIAGNOSIS — Z7901 Long term (current) use of anticoagulants: Secondary | ICD-10-CM

## 2022-12-30 LAB — POCT INR: INR: 2.2 (ref 2.0–3.0)

## 2022-12-30 NOTE — Patient Instructions (Addendum)
Pre visit review using our clinic review tool, if applicable. No additional management support is needed unless otherwise documented below in the visit note.  Continue 1/2 tablet daily except 1 tablet on Thursdays.  Recheck in 6 weeks.

## 2022-12-30 NOTE — Progress Notes (Signed)
Continue 1/2 tablet daily except 1 tablet on Thursdays.  Recheck in 6 weeks.

## 2022-12-30 NOTE — Telephone Encounter (Signed)
Called and spoke with patient to f/u on osteoporosis treatment. Pt states that she is doing well and does not wish to proceed with treatment for her osteoporosis.

## 2023-01-20 ENCOUNTER — Other Ambulatory Visit: Payer: Self-pay | Admitting: Internal Medicine

## 2023-02-02 ENCOUNTER — Ambulatory Visit (INDEPENDENT_AMBULATORY_CARE_PROVIDER_SITE_OTHER): Payer: Medicare Other

## 2023-02-02 DIAGNOSIS — I441 Atrioventricular block, second degree: Secondary | ICD-10-CM

## 2023-02-02 LAB — CUP PACEART REMOTE DEVICE CHECK
Battery Voltage: 85
Date Time Interrogation Session: 20241104115250
Implantable Lead Connection Status: 753985
Implantable Lead Connection Status: 753985
Implantable Lead Implant Date: 20230203
Implantable Lead Implant Date: 20230203
Implantable Lead Location: 753859
Implantable Lead Location: 753860
Implantable Lead Model: 377171
Implantable Lead Model: 377171
Implantable Lead Serial Number: 8000600801
Implantable Lead Serial Number: 8000640919
Implantable Pulse Generator Implant Date: 20230203
Pulse Gen Model: 407145
Pulse Gen Serial Number: 70290684

## 2023-02-10 ENCOUNTER — Ambulatory Visit: Payer: Medicare Other

## 2023-02-13 ENCOUNTER — Ambulatory Visit (INDEPENDENT_AMBULATORY_CARE_PROVIDER_SITE_OTHER): Payer: Medicare Other

## 2023-02-13 DIAGNOSIS — Z7901 Long term (current) use of anticoagulants: Secondary | ICD-10-CM | POA: Diagnosis not present

## 2023-02-13 LAB — POCT INR: INR: 1.4 — AB (ref 2.0–3.0)

## 2023-02-13 NOTE — Progress Notes (Signed)
Pt denies any changes. Increase dose today to take 1 tablet and then change weekly dose to take 1/2 tablet daily except take 1 tablet on Mondays and Thursday. Recheck on 02/24/23.

## 2023-02-13 NOTE — Patient Instructions (Addendum)
Pre visit review using our clinic review tool, if applicable. No additional management support is needed unless otherwise documented below in the visit note.  Increase dose today to take 1 tablet and then change weekly dose to take 1/2 tablet daily except take 1 tablet on Mondays and Thursday. Recheck on 02/24/23.

## 2023-02-17 NOTE — Progress Notes (Signed)
Remote ICD transmission.   

## 2023-02-23 ENCOUNTER — Other Ambulatory Visit: Payer: Self-pay | Admitting: Internal Medicine

## 2023-02-24 ENCOUNTER — Ambulatory Visit (INDEPENDENT_AMBULATORY_CARE_PROVIDER_SITE_OTHER): Payer: Medicare Other

## 2023-02-24 DIAGNOSIS — Z7901 Long term (current) use of anticoagulants: Secondary | ICD-10-CM

## 2023-02-24 LAB — POCT INR: INR: 1.8 — AB (ref 2.0–3.0)

## 2023-02-24 MED ORDER — WARFARIN SODIUM 5 MG PO TABS
ORAL_TABLET | ORAL | 1 refills | Status: DC
Start: 2023-02-24 — End: 2023-11-17

## 2023-02-24 NOTE — Patient Instructions (Addendum)
Pre visit review using our clinic review tool, if applicable. No additional management support is needed unless otherwise documented below in the visit note.  Increase dose today to take 1 tablet and then change weekly dose to take 1/2 tablet daily except take 1 tablet on Monday, Wednesday and Friday. Recheck in 2 weeks.

## 2023-02-24 NOTE — Progress Notes (Signed)
Increase dose today to take 1 tablet and then change weekly dose to take 1/2 tablet daily except take 1 tablet on Monday, Wednesday and Friday. Recheck in 2 weeks.

## 2023-03-10 ENCOUNTER — Ambulatory Visit (INDEPENDENT_AMBULATORY_CARE_PROVIDER_SITE_OTHER): Payer: Medicare Other

## 2023-03-10 DIAGNOSIS — Z7901 Long term (current) use of anticoagulants: Secondary | ICD-10-CM | POA: Diagnosis not present

## 2023-03-10 LAB — POCT INR: INR: 2.7 (ref 2.0–3.0)

## 2023-03-10 NOTE — Patient Instructions (Addendum)
Pre visit review using our clinic review tool, if applicable. No additional management support is needed unless otherwise documented below in the visit note.  Continue 1/2 tablet daily except take 1 tablet on Monday, Wednesday and Friday. Recheck in 6 weeks.

## 2023-03-10 NOTE — Progress Notes (Signed)
Continue 1/2 tablet daily except take 1 tablet on Monday, Wednesday and Friday. Recheck in 6 weeks.

## 2023-03-12 ENCOUNTER — Ambulatory Visit: Payer: Medicare Other

## 2023-03-12 VITALS — Ht 59.5 in | Wt 158.0 lb

## 2023-03-12 DIAGNOSIS — Z0000 Encounter for general adult medical examination without abnormal findings: Secondary | ICD-10-CM | POA: Diagnosis not present

## 2023-03-12 NOTE — Patient Instructions (Signed)
Ms. Perkowski , Thank you for taking time to come for your Medicare Wellness Visit. I appreciate your ongoing commitment to your health goals. Please review the following plan we discussed and let me know if I can assist you in the future.   Referrals/Orders/Follow-Ups/Clinician Recommendations: You are due for a Tetanus vaccine which you can have this done at your pharmacy.  You are also due for a Bone density, as Medicare pays for it every 2 years.  Remember to discuss this with Dr. Lawerance Bach during your up coming visit.  Rhoderick Moody are also due for a annual eye exam.  Please let us know when you find your new eye doctor.  Merry Christmas.  This is a list of the screening recommended for you and due dates:  Health Maintenance  Topic Date Due   DTaP/Tdap/Td vaccine (2 - Tdap) 05/01/2023*   Zoster (Shingles) Vaccine (1 of 2) 06/10/2023*   Flu Shot  06/29/2023*   Pneumonia Vaccine (1 of 2 - PCV) 03/11/2024*   DEXA scan (bone density measurement)  03/30/2024*   Medicare Annual Wellness Visit  03/11/2024   HPV Vaccine  Aged Out   COVID-19 Vaccine  Discontinued  *Topic was postponed. The date shown is not the original due date.    Advanced directives: (Copy Requested) Please bring a copy of your health care power of attorney and living will to the office to be added to your chart at your convenience.  Next Medicare Annual Wellness Visit scheduled for next year: Yes

## 2023-03-12 NOTE — Progress Notes (Signed)
Subjective:   Bonnie Zhang is a 82 y.o. female who presents for Medicare Annual (Subsequent) preventive examination.  Visit Complete: Virtual I connected with  Faelyn Speer Moffat on 03/12/23 by a audio enabled telemedicine application and verified that I am speaking with the correct person using two identifiers.  Patient Location: Home  Provider Location: Office/Clinic  I discussed the limitations of evaluation and management by telemedicine. The patient expressed understanding and agreed to proceed.  Vital Signs: Because this visit was a virtual/telehealth visit, some criteria may be missing or patient reported. Any vitals not documented were not able to be obtained and vitals that have been documented are patient reported.    Cardiac Risk Factors include: advanced age (>5men, >42 women);hypertension;Other (see comment);dyslipidemia;obesity (BMI >30kg/m2), Risk factor comments: PVD, TIA, Av Block, CKD     Objective:    Today's Vitals   03/12/23 0954  Weight: 158 lb (71.7 kg)  Height: 4' 11.5" (1.511 m)   Body mass index is 31.38 kg/m.     03/12/2023    9:58 AM 09/29/2022   10:23 AM 03/14/2022    8:48 AM 01/02/2022    9:22 AM 10/31/2021    6:46 PM 08/09/2021    3:02 PM 05/23/2021    8:24 AM  Advanced Directives  Does Patient Have a Medical Advance Directive? Yes Yes Yes Yes Yes Yes Yes  Type of Advance Directive Living will Living will Living will Living will Living will  Living will  Does patient want to make changes to medical advance directive?   No - Patient declined   No - Patient declined   Copy of Healthcare Power of Attorney in Chart?  Yes - validated most recent copy scanned in chart (See row information)     Yes - validated most recent copy scanned in chart (See row information)    Current Medications (verified) Outpatient Encounter Medications as of 03/12/2023  Medication Sig   acetaminophen (TYLENOL) 500 MG tablet Take 500-1,000 mg by mouth every 6 (six)  hours as needed for mild pain.   amLODipine (NORVASC) 10 MG tablet TAKE 1 TABLET (10 MG TOTAL) BY MOUTH DAILY.   benazepril (LOTENSIN) 40 MG tablet TAKE 1 TABLET BY MOUTH DAILY.   cholecalciferol (VITAMIN D3) 25 MCG (1000 UNIT) tablet Take 2,000 Units by mouth daily.   pantoprazole (PROTONIX) 40 MG tablet TAKE 1 TABLET BY MOUTH ONCE DAILY AS NEEDED FOR ACID REFLUX   simvastatin (ZOCOR) 10 MG tablet TAKE 1 TABLET BY MOUTH AT BEDTIME.   spironolactone (ALDACTONE) 25 MG tablet Take 1 tablet (25 mg total) by mouth daily.   vitamin B-12 (CYANOCOBALAMIN) 1000 MCG tablet Take 1,000 mcg by mouth daily.   warfarin (COUMADIN) 5 MG tablet TAKE 1/2 TABLET DAILY EXCEPT TAKE 1 TABLET ON MONDAYS, WEDNESDAYS AND FRIDAYS OR AS DIRECTED BY ANTICOAGULATION   No facility-administered encounter medications on file as of 03/12/2023.    Allergies (verified) Myrbetriq [mirabegron], Vicodin [hydrocodone-acetaminophen], Aspirin, Hctz [hydrochlorothiazide], and Lipitor [atorvastatin]   History: Past Medical History:  Diagnosis Date   Anticoagulated on Coumadin 2001   2 blood clots   Bruises easily    pt is on Coumadin;last dose of Coumadin 08/05/11 and then Lovenox started 08/07/11   DVT (deep vein thrombosis) in pregnancy     1965 post C section;2001 with prolonged driving while on  HRT   DVT (deep venous thrombosis) (HCC) 2001   Was on Prempro.     Endometrioid adenocarcinoma of uterus (HCC)  Gallstones    GERD (gastroesophageal reflux disease)    Protonix prn   High cholesterol    takes Simvastating daily   History of colon polyps 2007   Dr Jarold Motto   History of radiation therapy 09/06/2019-11/23/2019   IMRT Vagina/Pelvis and Vagina HDR 4 fx; Dr. Antony Blackbird   Homocysteinemia 01/23/2015   Hx of migraines    yrs ago    Hypertension    takes Benazepril,Amlodipine,and Metoprolol daily   Osteoporosis    PONV (postoperative nausea and vomiting)    Primary localized osteoarthritis of left knee    PVD  (peripheral vascular disease) (HCC) 1965, 2001   abnormal venous doppler findings due to recurrent DVT'S left leg   Right knee DJD    knees   Shortness of breath dyspnea    TIA (transient ischemic attack) 2020   Vitamin B12 deficiency (non anemic) 01/24/2015   Vitamin D deficiency    takes VIt D daily   Past Surgical History:  Procedure Laterality Date   ABDOMINAL HYSTERECTOMY     APPENDECTOMY  04/01/1963   BREAST LUMPECTOMY  03/31/1985   CESAREAN SECTION  1961/1965/1966   X 3   CHOLECYSTECTOMY  04/01/1995   COLONOSCOPY W/ POLYPECTOMY  03/31/2005   Dr  Jarold Motto; due? 2012   DILATION AND CURETTAGE OF UTERUS  03/31/2010   uterine polyp, Dr Shawnie Pons   ESOPHAGOGASTRODUODENOSCOPY  04/01/1995   HYSTEROSCOPY WITH D & C N/A 08/05/2012   Procedure: DILATATION AND CURETTAGE /HYSTEROSCOPY;  Surgeon: Reva Bores, MD;  Location: [facility];  Service: Gynecology;  Laterality: N/A;   PACEMAKER IMPLANT N/A 05/03/2021   Procedure: PACEMAKER IMPLANT;  Surgeon: Marinus Maw, MD;  Location: [facility] INVASIVE CV LAB;  Service: Cardiovascular;  Laterality: N/A;   ROBOTIC ASSISTED TOTAL HYSTERECTOMY WITH BILATERAL SALPINGO OOPHERECTOMY Right 09/14/2012   Procedure: ROBOTIC ASSISTED TOTAL HYSTERECTOMY WITH BILATERAL SALPINGO OOPHORECTOMY ,right pelvic LYMPHNODE dissection;  Surgeon: Rejeana Brock A. Duard Brady, MD;  Location: [facility];  Service: Gynecology;  Laterality: Right;   TOTAL KNEE ARTHROPLASTY  08/11/2011   Procedure: TOTAL KNEE ARTHROPLASTY;  Surgeon: Nilda Simmer, MD;  Location: [facility] OR;  Service: Orthopedics;  Laterality: Right;  DR Anne Ng 90 MINUTES FOR THIS CASE   TOTAL KNEE ARTHROPLASTY Left 09/11/2014   Procedure: TOTAL KNEE ARTHROPLASTY;  Surgeon: Salvatore Marvel, MD;  Location: [facility] OR;  Service: Orthopedics;  Laterality: Left;   WISDOM TOOTH EXTRACTION     Family History  Problem Relation Age of Onset   Kidney disease Mother        ? hypertensive   Hypertension Mother    Deep vein thrombosis Mother         post ankle fracture   Leukemia Father    Hypertension Sister    Liver disease Brother    Colon cancer Maternal Grandmother    Heart failure Maternal Grandfather    Diabetes Other        cousins   Anesthesia problems Neg Hx    Hypotension Neg Hx    Malignant hyperthermia Neg Hx    Pseudochol deficiency Neg Hx    Stroke Neg Hx    Heart disease Neg Hx    Breast cancer Neg Hx    Ovarian cancer Neg Hx    Uterine cancer Neg Hx    Pancreatic cancer Neg Hx    Cancer - Prostate Neg Hx    Social History   Socioeconomic History   Marital status: Divorced    Spouse name: Not on  file   Number of children: 2   Years of education: Not on file   Highest education level: High school graduate  Occupational History   Occupation: Retired  Tobacco Use   Smoking status: Never    Passive exposure: Never   Smokeless tobacco: Never  Vaping Use   Vaping status: Never Used  Substance and Sexual Activity   Alcohol use: No   Drug use: No   Sexual activity: Not Currently    Birth control/protection: Post-menopausal    Comment: retired. has 2 daughters  Other Topics Concern   Not on file  Social History Narrative   Pt lives alone she has 2 daughter - right handed- drinks tea, soda sometimes - No regular exercise   Social Drivers of Health   Financial Resource Strain: Low Risk  (03/12/2023)   Overall Financial Resource Strain (CARDIA)    Difficulty of Paying Living Expenses: Not hard at all  Food Insecurity: No Food Insecurity (03/12/2023)   Hunger Vital Sign    Worried About Running Out of Food in the Last Year: Never true    Ran Out of Food in the Last Year: Never true  Transportation Needs: No Transportation Needs (03/12/2023)   PRAPARE - Administrator, Civil Service (Medical): No    Lack of Transportation (Non-Medical): No  Physical Activity: Inactive (03/12/2023)   Exercise Vital Sign    Days of Exercise per Week: 0 days    Minutes of Exercise per Session: 0 min   Stress: No Stress Concern Present (03/12/2023)   [employer] of Occupational Health - Occupational Stress Questionnaire    Feeling of Stress : Not at all  Social Connections: Moderately Integrated (03/12/2023)   Social Connection and Isolation Panel [NHANES]    Frequency of Communication with Friends and Family: More than three times a week    Frequency of Social Gatherings with Friends and Family: Once a week    Attends Religious Services: More than 4 times per year    Active Member of [employer] or Organizations: Yes    Attends Banker Meetings: Never    Marital Status: Divorced    Tobacco Counseling Counseling given: Not Answered   Clinical Intake:  Pre-visit preparation completed: Yes  Pain : No/denies pain     BMI - recorded: 31.38 Nutritional Risks: None Diabetes: No  How often do you need to have someone help you when you read instructions, pamphlets, or other written materials from your doctor or pharmacy?: 1 - Never  Interpreter Needed?: No  Information entered by :: Denna Fryberger, RMA   Activities of Daily Living    03/12/2023    9:56 AM 03/14/2022    8:45 AM  In your present state of health, do you have any difficulty performing the following activities:  Hearing? 0 0  Vision? 0 0  Difficulty concentrating or making decisions? 0 0  Walking or climbing stairs? 0 0  Dressing or bathing? 0 0  Doing errands, shopping? 0 0  Preparing Food and eating ? N N  Using the Toilet? N N  In the past six months, have you accidently leaked urine? N N  Do you have problems with loss of bowel control? N N  Managing your Medications? N N  Managing your Finances? N N  Housekeeping or managing your Housekeeping? N N    Patient Care Team: Pincus Sanes, MD as PCP - General (Internal Medicine) Debbe Odea, MD as PCP - Cardiology (Cardiology) Lalla Brothers,  Rossie Muskrat, MD as PCP - Electrophysiology (Cardiology) Wendall Stade, MD as Consulting  Physician (Cardiology) Artis Delay, MD as Consulting Physician (Hematology and Oncology) Salvatore Marvel, MD as Consulting Physician (Orthopedic Surgery) Drema Dallas, DO as Consulting Physician (Neurology) Kathyrn Sheriff, [facility] (Inactive) as Pharmacist (Pharmacist)  Indicate any recent Medical Services you may have received from other than [facility] providers in the past year (date may be approximate).     Assessment:   This is a routine wellness examination for [facility].  Hearing/Vision screen Hearing Screening - Comments:: Denies hearing difficulties   Vision Screening - Comments:: Wears eyeglasses   Goals Addressed             This Visit's Progress    Patient Stated   On track    Stay physically and socially active, enjoy life and family.      Depression Screen    03/12/2023   10:05 AM 09/11/2022   10:42 AM 09/11/2022   10:41 AM 05/12/2022   10:33 AM 03/14/2022    8:40 AM 03/06/2022   10:06 AM 01/22/2022    4:04 PM  PHQ 2/9 Scores  PHQ - 2 Score 0 0 0 0 0 0 0  PHQ- 9 Score 0 0  0  0     Fall Risk    03/12/2023    9:59 AM 09/11/2022   10:41 AM 05/12/2022   10:33 AM 03/14/2022    8:49 AM 03/06/2022   10:06 AM  Fall Risk   Falls in the past year? 0 0 0 0 0  Number falls in past yr: 0 0 0 0 0  Injury with Fall? 0  0 0 0  Risk for fall due to : No Fall Risks No Fall Risks No Fall Risks History of fall(s) No Fall Risks  Follow up Falls prevention discussed;Falls evaluation completed Falls evaluation completed Falls evaluation completed Falls evaluation completed;Education provided Falls evaluation completed    MEDICARE RISK AT HOME: Medicare Risk at Home Any stairs in or around the home?: Yes If so, are there any without handrails?: Yes Home free of loose throw rugs in walkways, pet beds, electrical cords, etc?: Yes Adequate lighting in your home to reduce risk of falls?: Yes Life alert?: No Use of a cane, walker or w/c?: No Grab bars in the bathroom?:  Yes Shower chair or bench in shower?: Yes Elevated toilet seat or a handicapped toilet?: Yes  TIMED UP AND GO:  Was the test performed?  No    Cognitive Function:    01/07/2018    1:31 PM 12/22/2016    9:38 AM  MMSE - Mini Mental State Exam  Orientation to time 5 5  Orientation to Place 5 5  Registration 3 3  Attention/ Calculation 5 5  Recall 3 2  Language- name 2 objects 2 2  Language- repeat 1 1  Language- follow 3 step command 3 3  Language- read & follow direction 1 1  Write a sentence 1 1  Copy design 1 1  Total score 30 29        03/12/2023   10:01 AM 03/14/2022    8:50 AM  6CIT Screen  What Year? 0 points 0 points  What month? 0 points 0 points  What time? 0 points 0 points  Count back from 20 0 points 0 points  Months in reverse 0 points 0 points  Repeat phrase 0 points 0 points  Total Score 0 points 0 points  Immunizations Immunization History  Administered Date(s) Administered   Janssen (J&J) SARS-COV-2 Vaccination 07/08/2019   PFIZER Comirnaty(Gray Top)Covid-19 Tri-Sucrose Vaccine 04/04/2020   Td 03/21/2010    TDAP status: Due, Education has been provided regarding the importance of this vaccine. Advised may receive this vaccine at local pharmacy or Health Dept. Aware to provide a copy of the vaccination record if obtained from local pharmacy or Health Dept. Verbalized acceptance and understanding.  Flu Vaccine status: Declined, Education has been provided regarding the importance of this vaccine but patient still declined. Advised may receive this vaccine at local pharmacy or Health Dept. Aware to provide a copy of the vaccination record if obtained from local pharmacy or Health Dept. Verbalized acceptance and understanding.  Pneumococcal vaccine status: Declined,  Education has been provided regarding the importance of this vaccine but patient still declined. Advised may receive this vaccine at local pharmacy or Health Dept. Aware to provide a copy  of the vaccination record if obtained from local pharmacy or Health Dept. Verbalized acceptance and understanding.   Covid-19 vaccine status: Declined, Education has been provided regarding the importance of this vaccine but patient still declined. Advised may receive this vaccine at local pharmacy or Health Dept.or vaccine clinic. Aware to provide a copy of the vaccination record if obtained from local pharmacy or Health Dept. Verbalized acceptance and understanding.  Qualifies for Shingles Vaccine? Yes   Zostavax completed  Declines   Shingrix Completed?: No.    Education has been provided regarding the importance of this vaccine. Patient has been advised to call insurance company to determine out of pocket expense if they have not yet received this vaccine. Advised may also receive vaccine at local pharmacy or Health Dept. Verbalized acceptance and understanding.  Screening Tests Health Maintenance  Topic Date Due   DTaP/Tdap/Td (2 - Tdap) 05/01/2023 (Originally 03/21/2020)   Zoster Vaccines- Shingrix (1 of 2) 06/10/2023 (Originally [birth year]28/1960)   INFLUENZA VACCINE  06/29/2023 (Originally 10/30/2022)   Pneumonia Vaccine 55+ Years old (1 of 2 - PCV) 03/11/2024 (Originally 08/29/1946)   DEXA SCAN  03/30/2024 (Originally 01/03/2023)   Medicare Annual Wellness (AWV)  03/11/2024   HPV VACCINES  Aged Out   COVID-19 Vaccine  Discontinued    Health Maintenance  There are no preventive care reminders to display for this patient.   Colorectal cancer screening: No longer required.   Mammogram status: No longer required due to age.  Bone Density status: Completed 01/02/21. Results reflect: Bone density results: OSTEOPOROSIS. Repeat every 2 years.  Lung Cancer Screening: (Low Dose CT Chest recommended if Age 62-80 years, 20 pack-year currently smoking OR have quit w/in 15years.) does not qualify.   Lung Cancer Screening Referral: N/A  Additional Screening:  Hepatitis C Screening: does not  qualify;   Vision Screening: Recommended annual ophthalmology exams for early detection of glaucoma and other disorders of the eye. Is the patient up to date with their annual eye exam?  No  Who is the provider or what is the name of the office in which the patient attends annual eye exams? Pt looking for a new doctor If pt is not established with a provider, would they like to be referred to a provider to establish care? No .   Dental Screening: Recommended annual dental exams for proper oral hygiene  Community Resource Referral / Chronic Care Management: CRR required this visit?  No   CCM required this visit?  No     Plan:     I have  personally reviewed and noted the following in the patient's chart:   Medical and social history Use of alcohol, tobacco or illicit drugs  Current medications and supplements including opioid prescriptions. Patient is not currently taking opioid prescriptions. Functional ability and status Nutritional status Physical activity Advanced directives List of other physicians Hospitalizations, surgeries, and ER visits in previous 12 months Vitals Screenings to include cognitive, depression, and falls Referrals and appointments  In addition, I have reviewed and discussed with patient certain preventive protocols, quality metrics, and best practice recommendations. A written personalized care plan for preventive services as well as general preventive health recommendations were provided to patient.     Candice Lunney L Shequita Peplinski, CMA   03/12/2023   After Visit Summary: (Mail) Due to this being a telephonic visit, the after visit summary with patients personalized plan was offered to patient via mail   Nurse Notes: Patient is due for a Tdap and will get this done at the pharmacy.  She is also due for a DEXA but would like to discuss with Dr. Lawerance Bach her opinion about getting another DEXA since it has been 2 years since her last one.  She declines all other  vaccines.  Patient is also due for an eye exam, which she is searching for a new eye doctor.  She had no other concerns to address today.

## 2023-03-15 ENCOUNTER — Encounter: Payer: Self-pay | Admitting: Internal Medicine

## 2023-03-15 NOTE — Progress Notes (Unsigned)
Subjective:    Patient ID: Bonnie Zhang, female    DOB: 1940-04-05, 82 y.o.   MRN: [id number]      HPI Bonnie Zhang is here for a Physical exam and her chronic medical problems.   ? Taking folic acid   Retry for prolia?  Dexa?   Medications and allergies reviewed with patient and updated if appropriate.  Current Outpatient Medications on File Prior to Visit  Medication Sig Dispense Refill   acetaminophen (TYLENOL) 500 MG tablet Take 500-1,000 mg by mouth every 6 (six) hours as needed for mild pain.     amLODipine (NORVASC) 10 MG tablet TAKE 1 TABLET (10 MG TOTAL) BY MOUTH DAILY. 90 tablet 0   benazepril (LOTENSIN) 40 MG tablet TAKE 1 TABLET BY MOUTH DAILY. 90 tablet 1   cholecalciferol (VITAMIN D3) 25 MCG (1000 UNIT) tablet Take 2,000 Units by mouth daily.     pantoprazole (PROTONIX) 40 MG tablet TAKE 1 TABLET BY MOUTH ONCE DAILY AS NEEDED FOR ACID REFLUX 90 tablet 0   simvastatin (ZOCOR) 10 MG tablet TAKE 1 TABLET BY MOUTH AT BEDTIME. 90 tablet 1   spironolactone (ALDACTONE) 25 MG tablet Take 1 tablet (25 mg total) by mouth daily. 90 tablet 3   vitamin B-12 (CYANOCOBALAMIN) 1000 MCG tablet Take 1,000 mcg by mouth daily.     warfarin (COUMADIN) 5 MG tablet TAKE 1/2 TABLET DAILY EXCEPT TAKE 1 TABLET ON MONDAYS, WEDNESDAYS AND FRIDAYS OR AS DIRECTED BY ANTICOAGULATION 90 tablet 1   No current facility-administered medications on file prior to visit.    Review of Systems     Objective:  There were no vitals filed for this visit. There were no vitals filed for this visit. There is no height or weight on file to calculate BMI.  BP Readings from Last 3 Encounters:  11/13/22 (!) 145/65  09/29/22 130/60  09/18/22 133/76    Wt Readings from Last 3 Encounters:  03/12/23 158 lb (71.7 kg)  11/13/22 158 lb 8 oz (71.9 kg)  09/29/22 162 lb 6.4 oz (73.7 kg)       Physical Exam Constitutional: She appears well-developed and well-nourished. No distress.  HENT:  Head:  Normocephalic and atraumatic.  Right Ear: External ear normal. Normal ear canal and TM Left Ear: External ear normal.  Normal ear canal and TM Mouth/Throat: Oropharynx is clear and moist.  Eyes: Conjunctivae normal.  Neck: Neck supple. No tracheal deviation present. No thyromegaly present.  No carotid bruit  Cardiovascular: Normal rate, regular rhythm and normal heart sounds.   No murmur heard.  No edema. Pulmonary/Chest: Effort normal and breath sounds normal. No respiratory distress. She has no wheezes. She has no rales.  Breast: deferred   Abdominal: Soft. She exhibits no distension. There is no tenderness.  Lymphadenopathy: She has no cervical adenopathy.  Skin: Skin is warm and dry. She is not diaphoretic.  Psychiatric: She has a normal mood and affect. Her behavior is normal.     Lab Results  Component Value Date   WBC 5.5 09/11/2022   HGB 12.9 09/11/2022   HCT 39.2 09/11/2022   PLT 204.0 09/11/2022   GLUCOSE 94 09/11/2022   CHOL 147 09/11/2022   TRIG 114.0 09/11/2022   HDL 68.70 09/11/2022   LDLCALC 56 09/11/2022   ALT 10 09/11/2022   AST 18 09/11/2022   NA 138 09/11/2022   K 4.3 09/11/2022   CL 102 09/11/2022   CREATININE 1.05 09/11/2022   BUN 16 09/11/2022  CO2 28 09/11/2022   TSH 1.48 03/08/2020   INR 2.7 03/10/2023   HGBA1C 5.3 03/06/2022         Assessment & Plan:   Physical exam: Screening blood work  ordered Exercise   Weight   Substance abuse  none   Reviewed recommended immunizations.   Health Maintenance  Topic Date Due   DTaP/Tdap/Td (2 - Tdap) 05/01/2023 (Originally 03/21/2020)   Zoster Vaccines- Shingrix (1 of 2) 06/10/2023 (Originally 08[birth year]28/1960)   INFLUENZA VACCINE  06/29/2023 (Originally 10/30/2022)   Pneumonia Vaccine 22+ Years old (1 of 2 - PCV) 03/11/2024 (Originally 08/29/1946)   DEXA SCAN  03/30/2024 (Originally 01/03/2023)   Medicare Annual Wellness (AWV)  03/11/2024   HPV VACCINES  Aged Out   COVID-19 Vaccine  Discontinued           See Problem List for Assessment and Plan of chronic medical problems.

## 2023-03-15 NOTE — Patient Instructions (Addendum)
Blood work was ordered.       Medications changes include :   None    A referral was ordered and someone will call you to schedule an appointment.     Return in about 6 months (around 09/14/2023) for follow up.   Health Maintenance, Female Adopting a healthy lifestyle and getting preventive care are important in promoting health and wellness. Ask your health care provider about: The right schedule for you to have regular tests and exams. Things you can do on your own to prevent diseases and keep yourself healthy. What should I know about diet, weight, and exercise? Eat a healthy diet  Eat a diet that includes plenty of vegetables, fruits, low-fat dairy products, and lean protein. Do not eat a lot of foods that are high in solid fats, added sugars, or sodium. Maintain a healthy weight Body mass index (BMI) is used to identify weight problems. It estimates body fat based on height and weight. Your health care provider can help determine your BMI and help you achieve or maintain a healthy weight. Get regular exercise Get regular exercise. This is one of the most important things you can do for your health. Most adults should: Exercise for at least 150 minutes each week. The exercise should increase your heart rate and make you sweat (moderate-intensity exercise). Do strengthening exercises at least twice a week. This is in addition to the moderate-intensity exercise. Spend less time sitting. Even light physical activity can be beneficial. Watch cholesterol and blood lipids Have your blood tested for lipids and cholesterol at 81 years of age, then have this test every 5 years. Have your cholesterol levels checked more often if: Your lipid or cholesterol levels are high. You are older than 81 years of age. You are at high risk for heart disease. What should I know about cancer screening? Depending on your health history and family history, you may need to have cancer  screening at various ages. This may include screening for: Breast cancer. Cervical cancer. Colorectal cancer. Skin cancer. Lung cancer. What should I know about heart disease, diabetes, and high blood pressure? Blood pressure and heart disease High blood pressure causes heart disease and increases the risk of stroke. This is more likely to develop in people who have high blood pressure readings or are overweight. Have your blood pressure checked: Every 3-5 years if you are 18-93 years of age. Every year if you are 81 years old or older. Diabetes Have regular diabetes screenings. This checks your fasting blood sugar level. Have the screening done: Once every three years after age 16 if you are at a normal weight and have a low risk for diabetes. More often and at a younger age if you are overweight or have a high risk for diabetes. What should I know about preventing infection? Hepatitis B If you have a higher risk for hepatitis B, you should be screened for this virus. Talk with your health care provider to find out if you are at risk for hepatitis B infection. Hepatitis C Testing is recommended for: Everyone born from [birth year] through 1964. Anyone with known risk factors for hepatitis C. Sexually transmitted infections (STIs) Get screened for STIs, including gonorrhea and chlamydia, if: You are sexually active and are younger than 81 years of age. You are older than 80 years of age and your health care provider tells you that you are at risk for this type of infection. Your sexual activity has  changed since you were last screened, and you are at increased risk for chlamydia or gonorrhea. Ask your health care provider if you are at risk. Ask your health care provider about whether you are at high risk for HIV. Your health care provider may recommend a prescription medicine to help prevent HIV infection. If you choose to take medicine to prevent HIV, you should first get tested for HIV. You  should then be tested every 3 months for as long as you are taking the medicine. Pregnancy If you are about to stop having your period (premenopausal) and you may become pregnant, seek counseling before you get pregnant. Take 400 to 800 micrograms (mcg) of folic acid every day if you become pregnant. Ask for birth control (contraception) if you want to prevent pregnancy. Osteoporosis and menopause Osteoporosis is a disease in which the bones lose minerals and strength with aging. This can result in bone fractures. If you are 11 years old or older, or if you are at risk for osteoporosis and fractures, ask your health care provider if you should: Be screened for bone loss. Take a calcium or vitamin D supplement to lower your risk of fractures. Be given hormone replacement therapy (HRT) to treat symptoms of menopause. Follow these instructions at home: Alcohol use Do not drink alcohol if: Your health care provider tells you not to drink. You are pregnant, may be pregnant, or are planning to become pregnant. If you drink alcohol: Limit how much you have to: 0-1 drink a day. Know how much alcohol is in your drink. In the [place] one drink equals one 12 oz bottle of beer (355 mL), one 5 oz glass of wine (148 mL), or one 1 oz glass of hard liquor (44 mL). Lifestyle Do not use any products that contain nicotine or tobacco. These products include cigarettes, chewing tobacco, and vaping devices, such as e-cigarettes. If you need help quitting, ask your health care provider. Do not use street drugs. Do not share needles. Ask your health care provider for help if you need support or information about quitting drugs. General instructions Schedule regular health, dental, and eye exams. Stay current with your vaccines. Tell your health care provider if: You often feel depressed. You have ever been abused or do not feel safe at home. Summary Adopting a healthy lifestyle and getting preventive care are  important in promoting health and wellness. Follow your health care provider's instructions about healthy diet, exercising, and getting tested or screened for diseases. Follow your health care provider's instructions on monitoring your cholesterol and blood pressure. This information is not intended to replace advice given to you by your health care provider. Make sure you discuss any questions you have with your health care provider. Document Revised: 08/06/2020 Document Reviewed: 08/06/2020 Elsevier Patient Education  2024 [employer].

## 2023-03-16 ENCOUNTER — Other Ambulatory Visit: Payer: Self-pay | Admitting: Internal Medicine

## 2023-03-16 ENCOUNTER — Ambulatory Visit (INDEPENDENT_AMBULATORY_CARE_PROVIDER_SITE_OTHER): Payer: Medicare Other | Admitting: Internal Medicine

## 2023-03-16 VITALS — BP 134/76 | HR 80 | Temp 98.2°F | Ht 59.5 in | Wt 157.0 lb

## 2023-03-16 DIAGNOSIS — D6851 Activated protein C resistance: Secondary | ICD-10-CM | POA: Diagnosis not present

## 2023-03-16 DIAGNOSIS — E538 Deficiency of other specified B group vitamins: Secondary | ICD-10-CM | POA: Diagnosis not present

## 2023-03-16 DIAGNOSIS — E559 Vitamin D deficiency, unspecified: Secondary | ICD-10-CM | POA: Diagnosis not present

## 2023-03-16 DIAGNOSIS — R7983 Abnormal findings of blood amino-acid level: Secondary | ICD-10-CM

## 2023-03-16 DIAGNOSIS — Z0000 Encounter for general adult medical examination without abnormal findings: Secondary | ICD-10-CM | POA: Diagnosis not present

## 2023-03-16 DIAGNOSIS — E782 Mixed hyperlipidemia: Secondary | ICD-10-CM

## 2023-03-16 DIAGNOSIS — N1832 Chronic kidney disease, stage 3b: Secondary | ICD-10-CM | POA: Diagnosis not present

## 2023-03-16 DIAGNOSIS — M79622 Pain in left upper arm: Secondary | ICD-10-CM

## 2023-03-16 DIAGNOSIS — R739 Hyperglycemia, unspecified: Secondary | ICD-10-CM

## 2023-03-16 DIAGNOSIS — M810 Age-related osteoporosis without current pathological fracture: Secondary | ICD-10-CM

## 2023-03-16 DIAGNOSIS — K219 Gastro-esophageal reflux disease without esophagitis: Secondary | ICD-10-CM | POA: Diagnosis not present

## 2023-03-16 DIAGNOSIS — I10 Essential (primary) hypertension: Secondary | ICD-10-CM | POA: Diagnosis not present

## 2023-03-16 LAB — CBC WITH DIFFERENTIAL/PLATELET
Basophils Absolute: 0 10*3/uL (ref 0.0–0.1)
Basophils Relative: 0.2 % (ref 0.0–3.0)
Eosinophils Absolute: 0.1 10*3/uL (ref 0.0–0.7)
Eosinophils Relative: 1.1 % (ref 0.0–5.0)
HCT: 39.4 % (ref 36.0–46.0)
Hemoglobin: 13.2 g/dL (ref 12.0–15.0)
Lymphocytes Relative: 25.1 % (ref 12.0–46.0)
Lymphs Abs: 1.5 10*3/uL (ref 0.7–4.0)
MCHC: 33.4 g/dL (ref 30.0–36.0)
MCV: 91.6 fL (ref 78.0–100.0)
Monocytes Absolute: 0.4 10*3/uL (ref 0.1–1.0)
Monocytes Relative: 6.8 % (ref 3.0–12.0)
Neutro Abs: 4 10*3/uL (ref 1.4–7.7)
Neutrophils Relative %: 66.8 % (ref 43.0–77.0)
Platelets: 204 10*3/uL (ref 150.0–400.0)
RBC: 4.3 Mil/uL (ref 3.87–5.11)
RDW: 13.1 % (ref 11.5–15.5)
WBC: 6 10*3/uL (ref 4.0–10.5)

## 2023-03-16 LAB — COMPREHENSIVE METABOLIC PANEL
ALT: 9 U/L (ref 0–35)
AST: 15 U/L (ref 0–37)
Albumin: 4.5 g/dL (ref 3.5–5.2)
Alkaline Phosphatase: 86 U/L (ref 39–117)
BUN: 18 mg/dL (ref 6–23)
CO2: 25 meq/L (ref 19–32)
Calcium: 9.9 mg/dL (ref 8.4–10.5)
Chloride: 105 meq/L (ref 96–112)
Creatinine, Ser: 1.07 mg/dL (ref 0.40–1.20)
GFR: 48.7 mL/min — ABNORMAL LOW (ref >60.00)
Glucose, Bld: 106 mg/dL — ABNORMAL HIGH (ref 70–99)
Potassium: 4.4 meq/L (ref 3.5–5.1)
Sodium: 140 meq/L (ref 135–145)
Total Bilirubin: 0.7 mg/dL (ref 0.2–1.2)
Total Protein: 7.6 g/dL (ref 6.0–8.3)

## 2023-03-16 LAB — HEMOGLOBIN A1C: Hgb A1c MFr Bld: 5.5 % (ref 4.6–6.5)

## 2023-03-16 LAB — LIPID PANEL
Cholesterol: 157 mg/dL (ref 0–200)
HDL: 65 mg/dL (ref >39.00)
LDL Cholesterol: 65 mg/dL (ref 0–99)
NonHDL: 92.16
Total CHOL/HDL Ratio: 2
Triglycerides: 138 mg/dL (ref 0.0–149.0)
VLDL: 27.6 mg/dL (ref 0.0–40.0)

## 2023-03-16 LAB — FOLATE: Folate: 15.4 ng/mL (ref >5.9)

## 2023-03-16 LAB — VITAMIN B12: Vitamin B-12: 155 pg/mL — ABNORMAL LOW (ref 211–911)

## 2023-03-16 LAB — VITAMIN D 25 HYDROXY (VIT D DEFICIENCY, FRACTURES): VITD: 22.53 ng/mL — ABNORMAL LOW (ref 30.00–100.00)

## 2023-03-16 NOTE — Assessment & Plan Note (Signed)
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 

## 2023-03-16 NOTE — Assessment & Plan Note (Signed)
Acute Started 2-3 weeks ago without obvious injury Only has pain in the left upper arm when she moves the arm.  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

## 2023-03-16 NOTE — Assessment & Plan Note (Signed)
Chronic Blood pressure well-controlled Cmp, cbc Continue amlodipine 10 mg daily, benazepril 40 mg daily, spironolactone 25 mg daily

## 2023-03-16 NOTE — Assessment & Plan Note (Signed)
Chronic Taking vitamin D daily Check vitamin D level  

## 2023-03-16 NOTE — Assessment & Plan Note (Signed)
Chronic Regular exercise and healthy diet encouraged Check lipid panel, cmp Continue simvastatin 10 mg nightly

## 2023-03-16 NOTE — Assessment & Plan Note (Addendum)
Chronic Dexa due - deferred - does not want medication at this time Discussed increased risk of fracture Taking vitamin d 2000 units daily Start calcium 600 mg twice daily Advised regular exercise Check Vitamin d level today

## 2023-03-16 NOTE — Assessment & Plan Note (Signed)
Chronic  Check folic acid level, B12 level, homocysteine level

## 2023-03-16 NOTE — Assessment & Plan Note (Signed)
Chronic Taking B12 supplementation Check B12 level

## 2023-03-16 NOTE — Assessment & Plan Note (Signed)
Chronic Sugars have been in the normal range a1c

## 2023-03-16 NOTE — Assessment & Plan Note (Signed)
Chronic BP well controlled Sugars - normal Encouraged weight loss Increased water intake, avoid nsaids cmp

## 2023-03-16 NOTE — Assessment & Plan Note (Signed)
Chronic GERD controlled Continue pantoprazole 40 mg daily as needed 

## 2023-03-18 LAB — HOMOCYSTEINE: Homocysteine: 20.4 umol/L — ABNORMAL HIGH (ref <10.4)

## 2023-03-19 ENCOUNTER — Telehealth: Payer: Self-pay

## 2023-03-19 NOTE — Telephone Encounter (Signed)
Bonnie Zhang called office stating she isn't feeling well and needs to reschedule her appointment for tomorrow 12/20 with Dr.Tucker.   Pt declined next available of 12/26 and wanted something next year. Appointment rescheduled to 05/15/23 @ 3:15. Pt agreed to date and time asking if we could put her on a call list to move her up. She is aware being seen so far out would alter her scheduled rotation of every 6 months with Dr.Kinard/Dr.Tucker.

## 2023-03-20 ENCOUNTER — Inpatient Hospital Stay: Payer: Medicare Other | Admitting: Gynecologic Oncology

## 2023-03-20 ENCOUNTER — Telehealth: Payer: Self-pay | Admitting: Internal Medicine

## 2023-03-20 DIAGNOSIS — C541 Malignant neoplasm of endometrium: Secondary | ICD-10-CM

## 2023-03-20 NOTE — Telephone Encounter (Signed)
Patient would like a callback regarding her lab results she would like for the nurse to give her a callback

## 2023-03-31 NOTE — Telephone Encounter (Signed)
 Bonnie Zhang, CMA 03/23/2023 11:30 AM EST Back to Top    Results given to patient today.    Called patient today and left message. I believe this message was an older message regarding lab results. But left message for her to return call if she had any additional questions from our conversation on 03/23/23.

## 2023-04-21 ENCOUNTER — Ambulatory Visit (INDEPENDENT_AMBULATORY_CARE_PROVIDER_SITE_OTHER): Payer: Medicare Other

## 2023-04-21 DIAGNOSIS — Z7901 Long term (current) use of anticoagulants: Secondary | ICD-10-CM

## 2023-04-21 LAB — POCT INR: INR: 2.7 (ref 2.0–3.0)

## 2023-04-21 NOTE — Progress Notes (Signed)
Continue 1/2 tablet daily except take 1 tablet on Monday, Wednesday and Friday. Recheck in 6 weeks.

## 2023-04-21 NOTE — Patient Instructions (Addendum)
Pre visit review using our clinic review tool, if applicable. No additional management support is needed unless otherwise documented below in the visit note.  Continue 1/2 tablet daily except take 1 tablet on Monday, Wednesday and Friday. Recheck in 6 weeks.

## 2023-04-29 ENCOUNTER — Other Ambulatory Visit: Payer: Self-pay | Admitting: Internal Medicine

## 2023-04-29 ENCOUNTER — Other Ambulatory Visit: Payer: Self-pay

## 2023-04-29 MED ORDER — SPIRONOLACTONE 25 MG PO TABS: 25.0000 mg | ORAL_TABLET | Freq: Every day | ORAL | 0 refills | Status: DC

## 2023-05-04 ENCOUNTER — Ambulatory Visit: Payer: Medicare Other

## 2023-05-15 ENCOUNTER — Encounter: Payer: Self-pay | Admitting: Gynecologic Oncology

## 2023-05-15 ENCOUNTER — Inpatient Hospital Stay: Payer: Medicare Other | Attending: Gynecologic Oncology | Admitting: Gynecologic Oncology

## 2023-05-15 VITALS — BP 126/62 | HR 69 | Temp 97.9°F | Resp 20 | Wt 155.6 lb

## 2023-05-15 DIAGNOSIS — Z9079 Acquired absence of other genital organ(s): Secondary | ICD-10-CM | POA: Insufficient documentation

## 2023-05-15 DIAGNOSIS — Z08 Encounter for follow-up examination after completed treatment for malignant neoplasm: Secondary | ICD-10-CM | POA: Insufficient documentation

## 2023-05-15 DIAGNOSIS — Z923 Personal history of irradiation: Secondary | ICD-10-CM | POA: Diagnosis not present

## 2023-05-15 DIAGNOSIS — Z90722 Acquired absence of ovaries, bilateral: Secondary | ICD-10-CM | POA: Diagnosis not present

## 2023-05-15 DIAGNOSIS — C541 Malignant neoplasm of endometrium: Secondary | ICD-10-CM

## 2023-05-15 DIAGNOSIS — Z90710 Acquired absence of both cervix and uterus: Secondary | ICD-10-CM | POA: Insufficient documentation

## 2023-05-15 DIAGNOSIS — Z8542 Personal history of malignant neoplasm of other parts of uterus: Secondary | ICD-10-CM | POA: Insufficient documentation

## 2023-05-15 NOTE — Patient Instructions (Signed)
It was good to see you today.  I do not see or feel any evidence of cancer recurrence on your exam.  I will see you for follow-up in 12 months.  Please call my office sometime in the fall or early winter to schedule a visit to see me in February 2026.  As always, if you develop any new and concerning symptoms before your next visit, please call to see me sooner.

## 2023-05-15 NOTE — Progress Notes (Signed)
Gynecologic Oncology Return Clinic Visit  05/15/23  Reason for Visit: surveillance visit in the setting of endometrial cancer    Treatment History: Oncology History Overview Note  MMR IHC intact ER/PR strongly positive   Endometrial cancer (HCC)  08/09/2019 Initial Biopsy   Vaginal biopsy: Endometrioid adenoca, FIGO gr 2   08/19/2019 Initial Diagnosis   Endometrial cancer (HCC)   09/06/2019 - 11/23/2019 Radiation Therapy   Site Technique Total Dose (Gy) Dose per Fx (Gy) Completed Fx Beam Energies  Vagina: Pelvis_Bst HDR-brachy 24/24 6 4/4 Ir-192  Vagina: Pelvis IMRT 45/45 1.8 25/25 6X       2012: Endometrial sampling showed benign endometrium, polyps 08/05/2012: D&C and hysteroscopy, pathology revealed at least atypical complex endometrial hyperplasia with a focal area worrisome for well differentiated endometrioid carcinoma 09/14/2012: Total robotic hysterectomy, BSO, right pelvic lymph node dissection.  Frozen section with no evidence of cancer, possible hyperplasia.  Final pathology revealed extensive atypical complex hyperplasia.  4 right lymph nodes negative for malignancy. 08/09/2019: Patient presented to her OB/GYN with an episode of pink discharge followed by frank bleeding after using a douche.  Since that time has had continued brown discharge. 08/09/2019: Vaginal biopsy shows endometrioid adenocarcinoma. 08/19/2019: CT of the abdomen and pelvis shows no mass identified in the region of the vaginal cuff.  No pelvic adenopathy or metastatic disease. Recurrence treated with salvage radiation as above. 03/07/2021: CT of the abdomen and pelvis shows no evidence of disease recurrence or metastatic disease. 10/31/21: CT of the pelvis shows nondisplaced fracture.  No adenopathy or signs of metastatic disease.  Interval History: Doing well.  Denies any abdominal or pelvic pain.  Has scant bleeding intermittently when she uses her dilator, not every time, and not as frequently as she has  previously.  Reports baseline bowel bladder function.  Past Medical/Surgical History: Past Medical History:  Diagnosis Date   Anticoagulated on Coumadin 2001   2 blood clots   Bruises easily    pt is on Coumadin;last dose of Coumadin 08/05/11 and then Lovenox started 08/07/11   DVT (deep vein thrombosis) in pregnancy     1965 post C section;2001 with prolonged driving while on  HRT   DVT (deep venous thrombosis) (HCC) 2001   Was on Prempro.     Endometrioid adenocarcinoma of uterus (HCC)    Gallstones    GERD (gastroesophageal reflux disease)    Protonix prn   High cholesterol    takes Simvastating daily   History of colon polyps 2007   Dr Jarold Motto   History of radiation therapy 09/06/2019-11/23/2019   IMRT Vagina/Pelvis and Vagina HDR 4 fx; Dr. Antony Blackbird   Homocysteinemia 01/23/2015   Hx of migraines    yrs ago    Hypertension    takes Benazepril,Amlodipine,and Metoprolol daily   Osteoporosis    PONV (postoperative nausea and vomiting)    Primary localized osteoarthritis of left knee    PVD (peripheral vascular disease) (HCC) 1965, 2001   abnormal venous doppler findings due to recurrent DVT'S left leg   Right knee DJD    knees   Shortness of breath dyspnea    TIA (transient ischemic attack) 2020   Vitamin B12 deficiency (non anemic) 01/24/2015   Vitamin D deficiency    takes VIt D daily    Past Surgical History:  Procedure Laterality Date   ABDOMINAL HYSTERECTOMY     APPENDECTOMY  04/01/1963   BREAST LUMPECTOMY  03/31/1985   CESAREAN SECTION  1961/1965/1966   X 3  CHOLECYSTECTOMY  04/01/1995   COLONOSCOPY W/ POLYPECTOMY  03/31/2005   Dr  Jarold Motto; due? 2012   DILATION AND CURETTAGE OF UTERUS  03/31/2010   uterine polyp, Dr Shawnie Pons   ESOPHAGOGASTRODUODENOSCOPY  04/01/1995   HYSTEROSCOPY WITH D & C N/A 08/05/2012   Procedure: DILATATION AND CURETTAGE /HYSTEROSCOPY;  Surgeon: Reva Bores, MD;  Location: [facility];  Service: Gynecology;  Laterality: N/A;    PACEMAKER IMPLANT N/A 05/03/2021   Procedure: PACEMAKER IMPLANT;  Surgeon: Marinus Maw, MD;  Location: [facility] INVASIVE CV LAB;  Service: Cardiovascular;  Laterality: N/A;   ROBOTIC ASSISTED TOTAL HYSTERECTOMY WITH BILATERAL SALPINGO OOPHERECTOMY Right 09/14/2012   Procedure: ROBOTIC ASSISTED TOTAL HYSTERECTOMY WITH BILATERAL SALPINGO OOPHORECTOMY ,right pelvic LYMPHNODE dissection;  Surgeon: Rejeana Brock A. Duard Brady, MD;  Location: [facility];  Service: Gynecology;  Laterality: Right;   TOTAL KNEE ARTHROPLASTY  08/11/2011   Procedure: TOTAL KNEE ARTHROPLASTY;  Surgeon: Nilda Simmer, MD;  Location: [facility] OR;  Service: Orthopedics;  Laterality: Right;  DR Anne Ng 90 MINUTES FOR THIS CASE   TOTAL KNEE ARTHROPLASTY Left 09/11/2014   Procedure: TOTAL KNEE ARTHROPLASTY;  Surgeon: Salvatore Marvel, MD;  Location: [facility] OR;  Service: Orthopedics;  Laterality: Left;   WISDOM TOOTH EXTRACTION      Family History  Problem Relation Age of Onset   Kidney disease Mother        ? hypertensive   Hypertension Mother    Deep vein thrombosis Mother        post ankle fracture   Leukemia Father    Hypertension Sister    Liver disease Brother    Colon cancer Maternal Grandmother    Heart failure Maternal Grandfather    Diabetes Other        cousins   Anesthesia problems Neg Hx    Hypotension Neg Hx    Malignant hyperthermia Neg Hx    Pseudochol deficiency Neg Hx    Stroke Neg Hx    Heart disease Neg Hx    Breast cancer Neg Hx    Ovarian cancer Neg Hx    Uterine cancer Neg Hx    Pancreatic cancer Neg Hx    Cancer - Prostate Neg Hx     Social History   Socioeconomic History   Marital status: Divorced    Spouse name: Not on file   Number of children: 2   Years of education: Not on file   Highest education level: High school graduate  Occupational History   Occupation: Retired  Tobacco Use   Smoking status: Never    Passive exposure: Never   Smokeless tobacco: Never  Vaping Use   Vaping status: Never Used   Substance and Sexual Activity   Alcohol use: No   Drug use: No   Sexual activity: Not Currently    Birth control/protection: Post-menopausal    Comment: retired. has 2 daughters  Other Topics Concern   Not on file  Social History Narrative   Pt lives alone she has 2 daughter - right handed- drinks tea, soda sometimes - No regular exercise   Social Drivers of Health   Financial Resource Strain: Low Risk  (03/12/2023)   Overall Financial Resource Strain (CARDIA)    Difficulty of Paying Living Expenses: Not hard at all  Food Insecurity: No Food Insecurity (03/12/2023)   Hunger Vital Sign    Worried About Running Out of Food in the Last Year: Never true    Ran Out of Food in the Last Year: Never  true  Transportation Needs: No Transportation Needs (03/12/2023)   PRAPARE - Administrator, Civil Service (Medical): No    Lack of Transportation (Non-Medical): No  Physical Activity: Inactive (03/12/2023)   Exercise Vital Sign    Days of Exercise per Week: 0 days    Minutes of Exercise per Session: 0 min  Stress: No Stress Concern Present (03/12/2023)   [employer] of Occupational Health - Occupational Stress Questionnaire    Feeling of Stress : Not at all  Social Connections: Moderately Integrated (03/12/2023)   Social Connection and Isolation Panel [NHANES]    Frequency of Communication with Friends and Family: More than three times a week    Frequency of Social Gatherings with Friends and Family: Once a week    Attends Religious Services: More than 4 times per year    Active Member of [employer] or Organizations: Yes    Attends Banker Meetings: Never    Marital Status: Divorced    Current Medications:  Current Outpatient Medications:    acetaminophen (TYLENOL) 500 MG tablet, Take 500-1,000 mg by mouth every 6 (six) hours as needed for mild pain., Disp: , Rfl:    amLODipine (NORVASC) 10 MG tablet, TAKE 1 TABLET (10 MG TOTAL) BY MOUTH DAILY., Disp: 90  tablet, Rfl: 0   benazepril (LOTENSIN) 40 MG tablet, TAKE 1 TABLET BY MOUTH DAILY., Disp: 90 tablet, Rfl: 1   cholecalciferol (VITAMIN D3) 25 MCG (1000 UNIT) tablet, Take 2,000 Units by mouth daily., Disp: , Rfl:    pantoprazole (PROTONIX) 40 MG tablet, TAKE 1 TABLET BY MOUTH ONCE DAILY AS NEEDED FOR ACID REFLUX, Disp: 90 tablet, Rfl: 0   simvastatin (ZOCOR) 10 MG tablet, TAKE 1 TABLET BY MOUTH AT BEDTIME., Disp: 90 tablet, Rfl: 1   spironolactone (ALDACTONE) 25 MG tablet, Take 1 tablet (25 mg total) by mouth daily., Disp: 90 tablet, Rfl: 0   vitamin B-12 (CYANOCOBALAMIN) 1000 MCG tablet, Take 1,000 mcg by mouth daily., Disp: , Rfl:    warfarin (COUMADIN) 5 MG tablet, TAKE 1/2 TABLET DAILY EXCEPT TAKE 1 TABLET ON MONDAYS, WEDNESDAYS AND FRIDAYS OR AS DIRECTED BY ANTICOAGULATION, Disp: 90 tablet, Rfl: 1  Review of Systems: + chills sometimes at night Denies appetite changes, fevers, fatigue, unexplained weight changes. Denies hearing loss, neck lumps or masses, mouth sores, ringing in ears or voice changes. Denies cough or wheezing.  Denies shortness of breath. Denies chest pain or palpitations. Denies leg swelling. Denies abdominal distention, pain, blood in stools, constipation, diarrhea, nausea, vomiting, or early satiety. Denies pain with intercourse, dysuria, frequency, hematuria or incontinence. Denies hot flashes, pelvic pain or vaginal discharge.   Denies joint pain, back pain or muscle pain/cramps. Denies itching, rash, or wounds. Denies dizziness, headaches, numbness or seizures. Denies swollen lymph nodes or glands, denies easy bruising or bleeding. Denies anxiety, depression, confusion, or decreased concentration.  Physical Exam: BP (!) 134/52 (BP Location: Left Arm, Patient Position: Sitting) Comment: Notified RN  Pulse 69   Temp 97.9 F (36.6 C) (Oral)   Resp 20   Wt 155 lb 9.6 oz (70.6 kg)   SpO2 95%   BMI 30.90 kg/m  General: Alert, oriented, no acute  distress. HEENT: Normocephalic, atraumatic, sclera anicteric. Chest: Unlabored breathing on room air. Abdomen: Obese, soft, nontender.  Normoactive bowel sounds.  No masses or hepatosplenomegaly appreciated.  Well-healed scar. Extremities: Grossly normal range of motion.  Warm, well perfused.  Trace edema bilaterally. Skin: No rashes or lesions noted. Lymphatics:  No cervical, supraclavicular, or inguinal adenopathy. GU: Normal appearing external genitalia without erythema, excoriation, or lesions.  Speculum exam reveals narrowed vagina, difficult to see with the speculum past approximately 3 cm.  No bleeding or blood noted within the vagina.  Bimanual exam 1 finger reveals vagina is smooth, no nodularity appreciated.  Rectovaginal exam confirms these findings.  Laboratory & Radiologic Studies: None new  Assessment & Plan: Bonnie Zhang is a 83 y.o. woman with grade 2 endometrioid adenocarcinoma presenting at the vaginal cuff after prior hysterectomy for complex atypical hyperplasia who who completed definitive radiation treatment in August 2021.   The patient continues to do very well.  She is NED on exam today.     We reviewed again that given the narrowing at the top of the vagina, continued dilator use is very important.  She has been diligent about doing this.  She continues to have intermittent very light spotting, only with dilator use.  Spotting has decreased in frequency.  Continued dilator use recommended with lubricant.   Per NCCN surveillance recommendations, we have transition to visits every 6 months that she has more than 2 years out from completing treatment.  We will sinew to alternate visits between my clinic and radiation oncology.  She will see she will see Dr. Roselind Messier in 6 months and will call my office in the fall to schedule a visit to see me in 05/2023.  She knows to call the clinic if she develops any concerning symptoms before her next scheduled visit.  20 minutes of  total time was spent for this patient encounter, including preparation, face-to-face counseling with the patient and coordination of care, and documentation of the encounter.  Eugene Garnet, MD  Division of Gynecologic Oncology  Department of Obstetrics and Gynecology  [facility]

## 2023-05-21 ENCOUNTER — Other Ambulatory Visit: Payer: Self-pay | Admitting: Internal Medicine

## 2023-06-02 ENCOUNTER — Ambulatory Visit (INDEPENDENT_AMBULATORY_CARE_PROVIDER_SITE_OTHER): Payer: Medicare Other

## 2023-06-02 DIAGNOSIS — Z7901 Long term (current) use of anticoagulants: Secondary | ICD-10-CM | POA: Diagnosis not present

## 2023-06-02 LAB — POCT INR: INR: 2.6 (ref 2.0–3.0)

## 2023-06-02 NOTE — Progress Notes (Signed)
Continue 1/2 tablet daily except take 1 tablet on Monday, Wednesday and Friday. Recheck in 6 weeks.

## 2023-06-02 NOTE — Patient Instructions (Addendum)
Pre visit review using our clinic review tool, if applicable. No additional management support is needed unless otherwise documented below in the visit note.  Continue 1/2 tablet daily except take 1 tablet on Monday, Wednesday and Friday. Recheck in 6 weeks.

## 2023-07-14 ENCOUNTER — Ambulatory Visit (INDEPENDENT_AMBULATORY_CARE_PROVIDER_SITE_OTHER)

## 2023-07-14 DIAGNOSIS — Z7901 Long term (current) use of anticoagulants: Secondary | ICD-10-CM | POA: Diagnosis not present

## 2023-07-14 LAB — POCT INR: INR: 2.7 (ref 2.0–3.0)

## 2023-07-14 NOTE — Progress Notes (Signed)
 Continue 1/2 tablet daily except take 1 tablet on Monday, Wednesday and Friday. Recheck in 5 weeks.

## 2023-07-14 NOTE — Patient Instructions (Addendum)
 Pre visit review using our clinic review tool, if applicable. No additional management support is needed unless otherwise documented below in the visit note.  Continue 1/2 tablet daily except take 1 tablet on Monday, Wednesday and Friday. Recheck in 5 weeks.

## 2023-07-30 ENCOUNTER — Other Ambulatory Visit: Payer: Self-pay

## 2023-07-30 MED ORDER — SPIRONOLACTONE 25 MG PO TABS: 25.0000 mg | ORAL_TABLET | Freq: Every day | ORAL | 0 refills | Status: DC

## 2023-08-03 ENCOUNTER — Ambulatory Visit: Payer: Medicare Other

## 2023-08-03 DIAGNOSIS — I441 Atrioventricular block, second degree: Secondary | ICD-10-CM

## 2023-08-03 LAB — CUP PACEART REMOTE DEVICE CHECK
Battery Voltage: 80
Date Time Interrogation Session: 20250505102104
Implantable Lead Connection Status: 753985
Implantable Lead Connection Status: 753985
Implantable Lead Implant Date: 20230203
Implantable Lead Implant Date: 20230203
Implantable Lead Location: 753859
Implantable Lead Location: 753860
Implantable Lead Model: 377171
Implantable Lead Model: 377171
Implantable Lead Serial Number: 8000600801
Implantable Lead Serial Number: 8000640919
Implantable Pulse Generator Implant Date: 20230203
Pulse Gen Model: 407145
Pulse Gen Serial Number: 70290684

## 2023-08-05 ENCOUNTER — Ambulatory Visit: Payer: Medicare Other | Admitting: Cardiology

## 2023-08-18 ENCOUNTER — Ambulatory Visit (INDEPENDENT_AMBULATORY_CARE_PROVIDER_SITE_OTHER)

## 2023-08-18 DIAGNOSIS — Z7901 Long term (current) use of anticoagulants: Secondary | ICD-10-CM

## 2023-08-18 LAB — POCT INR: INR: 2.6 (ref 2.0–3.0)

## 2023-08-18 NOTE — Patient Instructions (Addendum)
Pre visit review using our clinic review tool, if applicable. No additional management support is needed unless otherwise documented below in the visit note.  Continue 1/2 tablet daily except take 1 tablet on Monday, Wednesday and Friday. Recheck in 6 weeks.

## 2023-08-18 NOTE — Progress Notes (Signed)
Continue 1/2 tablet daily except take 1 tablet on Monday, Wednesday and Friday. Recheck in 6 weeks.

## 2023-08-20 ENCOUNTER — Encounter: Payer: Self-pay | Admitting: Cardiology

## 2023-08-20 ENCOUNTER — Ambulatory Visit: Attending: Cardiology | Admitting: Cardiology

## 2023-08-20 VITALS — BP 106/56 | HR 72 | Ht 61.0 in | Wt 155.0 lb

## 2023-08-20 DIAGNOSIS — I441 Atrioventricular block, second degree: Secondary | ICD-10-CM | POA: Diagnosis not present

## 2023-08-20 DIAGNOSIS — E7800 Pure hypercholesterolemia, unspecified: Secondary | ICD-10-CM

## 2023-08-20 DIAGNOSIS — I10 Essential (primary) hypertension: Secondary | ICD-10-CM

## 2023-08-20 MED ORDER — AMLODIPINE BESYLATE 5 MG PO TABS: 5.0000 mg | ORAL_TABLET | Freq: Every day | ORAL | 3 refills | Status: DC

## 2023-08-20 NOTE — Patient Instructions (Signed)
 Medication Instructions:  Your physician recommends the following medication changes.  DECREASE: Amlodipine  5 mg once a day  *If you need a refill on your cardiac medications before your next appointment, please call your pharmacy*  Lab Work: No labs ordered today  If you have labs (blood work) drawn today and your tests are completely normal, you will receive your results only by: MyChart Message (if you have MyChart) OR A paper copy in the mail If you have any lab test that is abnormal or we need to change your treatment, we will call you to review the results.  Testing/Procedures: No test ordered today   Follow-Up: At [facility], you and your health needs are our priority.  As part of our continuing mission to provide you with exceptional heart care, our providers are all part of one team.  This team includes your primary Cardiologist (physician) and Advanced Practice Providers or APPs (Physician Assistants and Nurse Practitioners) who all work together to provide you with the care you need, when you need it.  Your next appointment:   3 month(s)  Provider:   You may see Constancia Delton, MD or one of the following Advanced Practice Providers on your designated Care Team:   Laneta Pintos, NP Gildardo Labrador, PA-C Varney Gentleman, PA-C Cadence Randall, PA-C Ronald Cockayne, NP Morey Ar, NP    We recommend signing up for the patient portal called "MyChart".  Sign up information is provided on this After Visit Summary.  MyChart is used to connect with patients for Virtual Visits (Telemedicine).  Patients are able to view lab/test results, encounter notes, upcoming appointments, etc.  Non-urgent messages can be sent to your provider as well.   To learn more about what you can do with MyChart, go to [email or web address].

## 2023-08-20 NOTE — Progress Notes (Signed)
 Cardiology Office Note:    Date:  08/20/2023   ID:  Bonnie Zhang, DOB 06-08-1940, MRN [id number]  PCP:  Colene Dauphin, MD   [facility] Providers Cardiologist:  Constancia Delton, MD Electrophysiologist:  Boyce Byes, MD     Referring MD: Colene Dauphin, MD   No chief complaint on file.   History of Present Illness:    Bonnie Zhang is a 83 y.o. 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   Overall doing okay, has felt dizzy over the past several days or so.  She checked her BP at home, had 1 value in the 90s.  Last BP checks has been in the low 100s.  Compliant with medications as prescribed.  Denies syncope.  Prior notes Coronary CT 6/24 minimal RCA stenosis less than 25%, calcium score 1.68, 10 percentile. Echo 09/2021 EF 60 to 65% Echo 09/2020 EF 60 to 65%, grade 2 diastolic dysfunction. Lexiscan  09/2020, no evidence for ischemia. Echo 2015 normal systolic function, impaired relaxation, EF 55 to 60%   Past Medical History:  Diagnosis Date   Anticoagulated on Coumadin  2001   2 blood clots   Bruises easily    pt is on Coumadin ;last dose of Coumadin  08/05/11 and then Lovenox  started 08/07/11   DVT (deep vein thrombosis) in pregnancy     1965 post C section;2001 with prolonged driving while on  HRT   DVT (deep venous thrombosis) (HCC) 2001   Was on Prempro.     Endometrioid adenocarcinoma of uterus (HCC)    Gallstones    GERD (gastroesophageal reflux disease)    Protonix  prn   High cholesterol    takes Simvastating daily   History of colon polyps 2007   Dr Adan Holms   History of radiation therapy 09/06/2019-11/23/2019   IMRT Vagina/Pelvis and Vagina HDR 4 fx; Dr. Retta Caster   Homocysteinemia 01/23/2015   Hx of migraines    yrs ago    Hypertension    takes Benazepril ,Amlodipine ,and Metoprolol  daily   Osteoporosis    PONV (postoperative  nausea and vomiting)    Primary localized osteoarthritis of left knee    PVD (peripheral vascular disease) (HCC) 1965, 2001   abnormal venous doppler findings due to recurrent DVT'S left leg   Right knee DJD    knees   Shortness of breath dyspnea    TIA (transient ischemic attack) 2020   Vitamin B12 deficiency (non anemic) 01/24/2015   Vitamin D  deficiency    takes VIt D daily    Past Surgical History:  Procedure Laterality Date   ABDOMINAL HYSTERECTOMY     APPENDECTOMY  04/01/1963   BREAST LUMPECTOMY  03/31/1985   CESAREAN SECTION  1961/1965/1966   X 3   CHOLECYSTECTOMY  04/01/1995   COLONOSCOPY W/ POLYPECTOMY  03/31/2005   Dr  Adan Holms; due? 2012   DILATION AND CURETTAGE OF UTERUS  03/31/2010   uterine polyp, Dr Adriana Hopping   ESOPHAGOGASTRODUODENOSCOPY  04/01/1995   HYSTEROSCOPY WITH D & C N/A 08/05/2012   Procedure: DILATATION AND CURETTAGE /HYSTEROSCOPY;  Surgeon: Granville Layer, MD;  Location: [facility];  Service: Gynecology;  Laterality: N/A;   PACEMAKER IMPLANT N/A 05/03/2021   Procedure: PACEMAKER IMPLANT;  Surgeon: Tammie Fall, MD;  Location: [facility] INVASIVE CV LAB;  Service: Cardiovascular;  Laterality: N/A;   ROBOTIC ASSISTED TOTAL HYSTERECTOMY WITH BILATERAL SALPINGO OOPHERECTOMY Right 09/14/2012   Procedure: ROBOTIC ASSISTED  TOTAL HYSTERECTOMY WITH BILATERAL SALPINGO OOPHORECTOMY ,right pelvic LYMPHNODE dissection;  Surgeon: Daryel Ensign A. Clerance Dais, MD;  Location: [facility];  Service: Gynecology;  Laterality: Right;   TOTAL KNEE ARTHROPLASTY  08/11/2011   Procedure: TOTAL KNEE ARTHROPLASTY;  Surgeon: Genevie Kerns, MD;  Location: [facility] OR;  Service: Orthopedics;  Laterality: Right;  DR Alvia Awkward 90 MINUTES FOR THIS CASE   TOTAL KNEE ARTHROPLASTY Left 09/11/2014   Procedure: TOTAL KNEE ARTHROPLASTY;  Surgeon: Elly Habermann, MD;  Location: [facility] OR;  Service: Orthopedics;  Laterality: Left;   WISDOM TOOTH EXTRACTION      Current Medications: Current Meds  Medication Sig   acetaminophen   (TYLENOL ) 500 MG tablet Take 500-1,000 mg by mouth every 6 (six) hours as needed for mild pain.   benazepril  (LOTENSIN ) 40 MG tablet TAKE 1 TABLET BY MOUTH DAILY.   cholecalciferol  (VITAMIN D3) 25 MCG (1000 UNIT) tablet Take 2,000 Units by mouth daily.   pantoprazole  (PROTONIX ) 40 MG tablet TAKE 1 TABLET BY MOUTH ONCE DAILY AS NEEDED FOR ACID REFLUX   simvastatin  (ZOCOR ) 10 MG tablet TAKE 1 TABLET BY MOUTH AT BEDTIME.   spironolactone  (ALDACTONE ) 25 MG tablet Take 1 tablet (25 mg total) by mouth daily.   vitamin B-12 (CYANOCOBALAMIN ) 1000 MCG tablet Take 1,000 mcg by mouth daily.   warfarin (COUMADIN ) 5 MG tablet TAKE 1/2 TABLET DAILY EXCEPT TAKE 1 TABLET ON MONDAYS, WEDNESDAYS AND FRIDAYS OR AS DIRECTED BY ANTICOAGULATION   [DISCONTINUED] amLODipine  (NORVASC ) 10 MG tablet TAKE 1 TABLET (10 MG TOTAL) BY MOUTH DAILY. DUE FOR OFFICE VISIT NEXT MONTH     Allergies:   Myrbetriq  [mirabegron ], Vicodin [hydrocodone-acetaminophen ], Aspirin, Hctz [hydrochlorothiazide], and Lipitor [atorvastatin]   Social History   Socioeconomic History   Marital status: Divorced    Spouse name: Not on file   Number of children: 2   Years of education: Not on file   Highest education level: High school graduate  Occupational History   Occupation: Retired  Tobacco Use   Smoking status: Never    Passive exposure: Never   Smokeless tobacco: Never  Vaping Use   Vaping status: Never Used  Substance and Sexual Activity   Alcohol use: No   Drug use: No   Sexual activity: Not Currently    Birth control/protection: Post-menopausal    Comment: retired. has 2 daughters  Other Topics Concern   Not on file  Social History Narrative   Pt lives alone she has 2 daughter - right handed- drinks tea, soda sometimes - No regular exercise   Social Drivers of Health   Financial Resource Strain: Low Risk  (03/12/2023)   Overall Financial Resource Strain (CARDIA)    Difficulty of Paying Living Expenses: Not hard at all   Food Insecurity: No Food Insecurity (03/12/2023)   Hunger Vital Sign    Worried About Running Out of Food in the Last Year: Never true    Ran Out of Food in the Last Year: Never true  Transportation Needs: No Transportation Needs (03/12/2023)   PRAPARE - Administrator, Civil Service (Medical): No    Lack of Transportation (Non-Medical): No  Physical Activity: Inactive (03/12/2023)   Exercise Vital Sign    Days of Exercise per Week: 0 days    Minutes of Exercise per Session: 0 min  Stress: No Stress Concern Present (03/12/2023)   [employer] of Occupational Health - Occupational Stress Questionnaire    Feeling of Stress : Not at all  Social Connections: Moderately Integrated (  03/12/2023)   Social Connection and Isolation Panel [NHANES]    Frequency of Communication with Friends and Family: More than three times a week    Frequency of Social Gatherings with Friends and Family: Once a week    Attends Religious Services: More than 4 times per year    Active Member of [employer] or Organizations: Yes    Attends Banker Meetings: Never    Marital Status: Divorced     Family History: The patient's family history includes Colon cancer in her maternal grandmother; Deep vein thrombosis in her mother; Diabetes in an other family member; Heart failure in her maternal grandfather; Hypertension in her mother and sister; Kidney disease in her mother; Leukemia in her father; Liver disease in her brother. There is no history of Anesthesia problems, Hypotension, Malignant hyperthermia, Pseudochol deficiency, Stroke, Heart disease, Breast cancer, Ovarian cancer, Uterine cancer, Pancreatic cancer, or Cancer - Prostate.  ROS:   Please see the history of present illness.     All other systems reviewed and are negative.  EKGs/Labs/Other Studies Reviewed:    The following studies were reviewed today:   EKG Interpretation Date/Time:  Thursday Aug 20 2023 09:11:41  EDT Ventricular Rate:  77 PR Interval:  178 QRS Duration:  90 QT Interval:  380 QTC Calculation: 430 R Axis:   -3  Text Interpretation: Atrial-sensed ventricular-paced rhythm Confirmed by Constancia Delton ([id number]) on 08/20/2023 9:47:33 AM     Recent Labs: 03/16/2023: ALT 9; BUN 18; Creatinine, Ser 1.07; Hemoglobin 13.2; Platelets 204.0; Potassium 4.4; Sodium 140  Recent Lipid Panel    Component Value Date/Time   CHOL 157 03/16/2023 1106   CHOL 178 05/25/2014 1121   TRIG 138.0 03/16/2023 1106   TRIG 138 05/25/2014 1121   HDL 65.00 03/16/2023 1106   HDL 76 05/25/2014 1121   CHOLHDL 2 03/16/2023 1106   VLDL 27.6 03/16/2023 1106   LDLCALC 65 03/16/2023 1106   LDLCALC 74 05/25/2014 1121     Risk Assessment/Calculations:          Physical Exam:    VS:  BP (!) 106/56   Pulse 72   Ht 5\' 1"  (1.549 m)   Wt 155 lb (70.3 kg)   SpO2 98%   BMI 29.29 kg/m     Wt Readings from Last 3 Encounters:  08/20/23 155 lb (70.3 kg)  05/15/23 155 lb 9.6 oz (70.6 kg)  03/16/23 157 lb (71.2 kg)     GEN:  Well nourished, well developed in no acute distress HEENT: Normal NECK: No JVD; No carotid bruits CARDIAC: RRR, no murmurs, rubs, gallops RESPIRATORY:  Clear to auscultation without rales, wheezing or rhonchi  ABDOMEN: Soft, non-tender, non-distended MUSCULOSKELETAL:  No edema; No deformity  SKIN: Warm and dry NEUROLOGIC:  Alert and oriented x 3 PSYCHIATRIC:  Normal affect   ASSESSMENT:    1. AV block, Mobitz 2   2. Primary hypertension   3. Pure hypercholesterolemia    PLAN:    In order of problems listed above:  Mobitz 2 AV block s/p PPM, 05/03/2021.  EKG showing a sensed V paced rhythm.  Echo 10/2021 EF 60 to 65%.  Continue pacemaker checks with device clinic. Hypertension, BP slightly low, occasional dizziness.  Reduce Norvasc  to 5 mg daily.  Continue Aldactone  25 mg daily, benazepril  40 mg daily. Hyperlipidemia, cholesterol controlled.  Continue simvastatin .  10 mg  daily   Medication Adjustments/Labs and Tests Ordered: Current medicines are reviewed at length with the patient today.  Concerns regarding medicines are outlined above.  Orders Placed This Encounter  Procedures   EKG 12-Lead    Meds ordered this encounter  Medications   amLODipine  (NORVASC ) 5 MG tablet    Sig: Take 1 tablet (5 mg total) by mouth daily.    Dispense:  30 tablet    Refill:  3      Patient Instructions  Medication Instructions:  Your physician recommends the following medication changes.  DECREASE: Amlodipine  5 mg once a day  *If you need a refill on your cardiac medications before your next appointment, please call your pharmacy*  Lab Work: No labs ordered today  If you have labs (blood work) drawn today and your tests are completely normal, you will receive your results only by: MyChart Message (if you have MyChart) OR A paper copy in the mail If you have any lab test that is abnormal or we need to change your treatment, we will call you to review the results.  Testing/Procedures: No test ordered today   Follow-Up: At [facility], you and your health needs are our priority.  As part of our continuing mission to provide you with exceptional heart care, our providers are all part of one team.  This team includes your primary Cardiologist (physician) and Advanced Practice Providers or APPs (Physician Assistants and Nurse Practitioners) who all work together to provide you with the care you need, when you need it.  Your next appointment:   3 month(s)  Provider:   You may see Constancia Delton, MD or one of the following Advanced Practice Providers on your designated Care Team:   Laneta Pintos, NP Gildardo Labrador, PA-C Varney Gentleman, PA-C Cadence Pablo, PA-C Ronald Cockayne, NP Morey Ar, NP    We recommend signing up for the patient portal called "MyChart".  Sign up information is provided on this After Visit Summary.  MyChart is used to  connect with patients for Virtual Visits (Telemedicine).  Patients are able to view lab/test results, encounter notes, upcoming appointments, etc.  Non-urgent messages can be sent to your provider as well.   To learn more about what you can do with MyChart, go to [email or web address].          Signed, Constancia Delton, MD  08/20/2023 10:43 AM    [facility]

## 2023-09-08 NOTE — Progress Notes (Unsigned)
  Electrophysiology Office Follow up Visit Note:    Date:  09/09/2023   ID:  Bonnie Zhang, DOB 1940-07-26, MRN [id number]  PCP:  Colene Dauphin, MD  [facility] Cardiologist:  Constancia Delton, MD  [facility] Electrophysiologist:  Boyce Byes, MD    Interval History:     Bonnie Zhang is a 83 y.o. female who presents for a follow up visit.   I last saw the patient in September 03, 2022.  She has a history of Mobitz 2 with a permanent pacemaker in place.  Her pacemaker was implanted February 2023.  She is doing well today.  No complaints.        Past medical, surgical, social and family history were reviewed.  ROS:   Please see the history of present illness.    All other systems reviewed and are negative.  EKGs/Labs/Other Studies Reviewed:    The following studies were reviewed today:  September 09, 2023 in-clinic device interrogation personally reviewed Presenting rhythm is AV sequential pacing Thresholds are stable Dependent in the ventricle today Programmed auto capture on with a 1 V safety margin in the ventricle.        Physical Exam:    VS:  BP (!) 110/56 (BP Location: Left Arm)   Pulse 72   Ht 5' (1.524 m)   Wt 157 lb 3.2 oz (71.3 kg)   SpO2 97%   BMI 30.70 kg/m     Wt Readings from Last 3 Encounters:  09/09/23 157 lb 3.2 oz (71.3 kg)  08/20/23 155 lb (70.3 kg)  05/15/23 155 lb 9.6 oz (70.6 kg)     GEN: no distress CARD: RRR, No MRG.  CIED pocket well-healed RESP: No IWOB. CTAB.      ASSESSMENT:    1. AV block, Mobitz 2   2. Primary hypertension   3. Cardiac pacemaker in situ    PLAN:    In order of problems listed above:  #Mobitz 2 AV block #Symptomatic bradycardia #Permanent pacemaker in situ Device functioning appropriately.  Continue remote monitoring.  #Hypertension At goal today.  Recommend checking blood pressures 1-2 times per week at home and recording the values.  Recommend bringing these recordings to the  primary care physician.  Follow-up 2 years with APP.   Signed, Harvie Liner, MD, [facility], [facility] 09/09/2023 9:56 AM    Electrophysiology [facility]

## 2023-09-09 ENCOUNTER — Ambulatory Visit: Payer: Medicare Other | Attending: Cardiology | Admitting: Cardiology

## 2023-09-09 VITALS — BP 110/56 | HR 72 | Ht 60.0 in | Wt 157.2 lb

## 2023-09-09 DIAGNOSIS — Z950 Presence of cardiac pacemaker: Secondary | ICD-10-CM

## 2023-09-09 DIAGNOSIS — I10 Essential (primary) hypertension: Secondary | ICD-10-CM

## 2023-09-09 DIAGNOSIS — I441 Atrioventricular block, second degree: Secondary | ICD-10-CM | POA: Diagnosis not present

## 2023-09-09 LAB — CUP PACEART INCLINIC DEVICE CHECK
Battery Remaining Percentage: 80 %
Brady Statistic RA Percent Paced: 46 %
Brady Statistic RV Percent Paced: 96 %
Date Time Interrogation Session: 20250611094735
Implantable Lead Connection Status: 753985
Implantable Lead Connection Status: 753985
Implantable Lead Implant Date: 20230203
Implantable Lead Implant Date: 20230203
Implantable Lead Location: 753859
Implantable Lead Location: 753860
Implantable Lead Model: 377171
Implantable Lead Model: 377171
Implantable Lead Serial Number: 8000600801
Implantable Lead Serial Number: 8000640919
Implantable Pulse Generator Implant Date: 20230203
Lead Channel Impedance Value: 487 Ohm
Lead Channel Impedance Value: 526 Ohm
Lead Channel Pacing Threshold Amplitude: 0.5 V
Lead Channel Pacing Threshold Amplitude: 1.1 V
Lead Channel Pacing Threshold Pulse Width: 0.4 ms
Lead Channel Pacing Threshold Pulse Width: 0.4 ms
Lead Channel Setting Pacing Amplitude: 3 V
Lead Channel Setting Pacing Amplitude: 3 V
Lead Channel Setting Pacing Pulse Width: 0.4 ms
Pulse Gen Model: 407145
Pulse Gen Serial Number: 70290684

## 2023-09-09 NOTE — Patient Instructions (Signed)
 Medication Instructions:  Your physician recommends that you continue on your current medications as directed. Please refer to the Current Medication list given to you today.  *If you need a refill on your cardiac medications before your next appointment, please call your pharmacy*  Follow-Up: At [facility], you and your health needs are our priority.  As part of our continuing mission to provide you with exceptional heart care, our providers are all part of one team.  This team includes your primary Cardiologist (physician) and Advanced Practice Providers or APPs (Physician Assistants and Nurse Practitioners) who all work together to provide you with the care you need, when you need it.  Your next appointment:   2 years  Provider:   You will see one of the following Advanced Practice Providers on your designated Care Team:   Mertha Abrahams, Kennard Pea [address] [place], PA-C Suzann Riddle, NP Creighton Doffing, NP

## 2023-09-12 ENCOUNTER — Ambulatory Visit: Payer: Self-pay | Admitting: Cardiology

## 2023-09-13 ENCOUNTER — Encounter: Payer: Self-pay | Admitting: Internal Medicine

## 2023-09-13 NOTE — Progress Notes (Unsigned)
      Subjective:    Patient ID: Bonnie Zhang, female    DOB: Aug 04, 1940, 83 y.o.   MRN: [id number]     HPI Bonnie Zhang is here for follow up of her chronic medical problems.  ?  Reconsider DEXA  Medications and allergies reviewed with patient and updated if appropriate.  Current Outpatient Medications on File Prior to Visit  Medication Sig Dispense Refill   acetaminophen  (TYLENOL ) 500 MG tablet Take 500-1,000 mg by mouth every 6 (six) hours as needed for mild pain.     amLODipine  (NORVASC ) 5 MG tablet Take 1 tablet (5 mg total) by mouth daily. 30 tablet 3   benazepril  (LOTENSIN ) 40 MG tablet TAKE 1 TABLET BY MOUTH DAILY. 90 tablet 1   cholecalciferol  (VITAMIN D3) 25 MCG (1000 UNIT) tablet Take 2,000 Units by mouth daily.     pantoprazole  (PROTONIX ) 40 MG tablet TAKE 1 TABLET BY MOUTH ONCE DAILY AS NEEDED FOR ACID REFLUX 90 tablet 0   simvastatin  (ZOCOR ) 10 MG tablet TAKE 1 TABLET BY MOUTH AT BEDTIME. 90 tablet 1   spironolactone  (ALDACTONE ) 25 MG tablet Take 1 tablet (25 mg total) by mouth daily. 90 tablet 0   vitamin B-12 (CYANOCOBALAMIN ) 1000 MCG tablet Take 1,000 mcg by mouth daily.     warfarin (COUMADIN ) 5 MG tablet TAKE 1/2 TABLET DAILY EXCEPT TAKE 1 TABLET ON MONDAYS, WEDNESDAYS AND FRIDAYS OR AS DIRECTED BY ANTICOAGULATION 90 tablet 1   No current facility-administered medications on file prior to visit.     Review of Systems     Objective:  There were no vitals filed for this visit. BP Readings from Last 3 Encounters:  09/09/23 (!) 110/56  08/20/23 (!) 106/56  05/15/23 126/62   Wt Readings from Last 3 Encounters:  09/09/23 157 lb 3.2 oz (71.3 kg)  08/20/23 155 lb (70.3 kg)  05/15/23 155 lb 9.6 oz (70.6 kg)   There is no height or weight on file to calculate BMI.    Physical Exam     Lab Results  Component Value Date   WBC 6.0 03/16/2023   HGB 13.2 03/16/2023   HCT 39.4 03/16/2023   PLT 204.0 03/16/2023   GLUCOSE 106 (H) 03/16/2023   CHOL 157  03/16/2023   TRIG 138.0 03/16/2023   HDL 65.00 03/16/2023   LDLCALC 65 03/16/2023   ALT 9 03/16/2023   AST 15 03/16/2023   NA 140 03/16/2023   K 4.4 03/16/2023   CL 105 03/16/2023   CREATININE 1.07 03/16/2023   BUN 18 03/16/2023   CO2 25 03/16/2023   TSH 1.48 03/08/2020   INR 2.6 08/18/2023   HGBA1C 5.5 03/16/2023     Assessment & Plan:    See Problem List for Assessment and Plan of chronic medical problems.

## 2023-09-13 NOTE — Patient Instructions (Addendum)
     Blood work was ordered.       Medications changes include :   None    A referral was ordered and someone will call you to schedule an appointment.     Return in about 6 months (around 03/15/2024) for Physical Exam.

## 2023-09-14 ENCOUNTER — Ambulatory Visit (INDEPENDENT_AMBULATORY_CARE_PROVIDER_SITE_OTHER): Payer: Medicare Other | Admitting: Internal Medicine

## 2023-09-14 ENCOUNTER — Other Ambulatory Visit: Payer: Self-pay | Admitting: Internal Medicine

## 2023-09-14 VITALS — BP 116/68 | HR 77 | Temp 98.6°F | Ht 60.0 in | Wt 154.0 lb

## 2023-09-14 DIAGNOSIS — E559 Vitamin D deficiency, unspecified: Secondary | ICD-10-CM

## 2023-09-14 DIAGNOSIS — I10 Essential (primary) hypertension: Secondary | ICD-10-CM

## 2023-09-14 DIAGNOSIS — E538 Deficiency of other specified B group vitamins: Secondary | ICD-10-CM

## 2023-09-14 DIAGNOSIS — D6851 Activated protein C resistance: Secondary | ICD-10-CM | POA: Diagnosis not present

## 2023-09-14 DIAGNOSIS — M810 Age-related osteoporosis without current pathological fracture: Secondary | ICD-10-CM | POA: Diagnosis not present

## 2023-09-14 DIAGNOSIS — N1832 Chronic kidney disease, stage 3b: Secondary | ICD-10-CM

## 2023-09-14 DIAGNOSIS — Z86718 Personal history of other venous thrombosis and embolism: Secondary | ICD-10-CM | POA: Diagnosis not present

## 2023-09-14 DIAGNOSIS — R739 Hyperglycemia, unspecified: Secondary | ICD-10-CM | POA: Diagnosis not present

## 2023-09-14 DIAGNOSIS — K219 Gastro-esophageal reflux disease without esophagitis: Secondary | ICD-10-CM

## 2023-09-14 NOTE — Assessment & Plan Note (Signed)
Chronic Taking B12 supplementation Check B12 level

## 2023-09-14 NOTE — Assessment & Plan Note (Signed)
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

## 2023-09-14 NOTE — Assessment & Plan Note (Signed)
 Chronic Sugars have been in the normal range a1c

## 2023-09-14 NOTE — Assessment & Plan Note (Signed)
 Chronic Dexa due - deferred - does not want medication at this time Discussed increased risk of fracture Taking vitamin d 2000 units daily Start calcium 600 mg twice daily Advised regular exercise Check Vitamin d level today

## 2023-09-14 NOTE — Assessment & Plan Note (Signed)
 Chronic BP well controlled Sugars - normal Encouraged weight loss Increased water  intake, avoid nsaids Cmp, cbc

## 2023-09-14 NOTE — Assessment & Plan Note (Signed)
 Chronic Blood pressure well-controlled Cmp, cbc Continue amlodipine  5 mg daily, benazepril  40 mg daily, spironolactone  25 mg daily

## 2023-09-14 NOTE — Assessment & Plan Note (Signed)
 Chronic Taking vitamin D daily Check vitamin D level

## 2023-09-14 NOTE — Assessment & Plan Note (Signed)
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 

## 2023-09-14 NOTE — Assessment & Plan Note (Signed)
 Chronic GERD controlled Continue pantoprazole 40 mg daily as needed

## 2023-09-15 ENCOUNTER — Ambulatory Visit: Payer: Self-pay | Admitting: Urology

## 2023-09-15 LAB — CBC WITH DIFFERENTIAL/PLATELET
Absolute Lymphocytes: 1142 {cells}/uL (ref 850–3900)
Absolute Monocytes: 426 {cells}/uL (ref 200–950)
Basophils Absolute: 20 {cells}/uL (ref 0–200)
Basophils Relative: 0.4 %
Eosinophils Absolute: 59 {cells}/uL (ref 15–500)
Eosinophils Relative: 1.2 %
HCT: 38 % (ref 35.0–45.0)
Hemoglobin: 12.3 g/dL (ref 11.7–15.5)
MCH: 30.1 pg (ref 27.0–33.0)
MCHC: 32.4 g/dL (ref 32.0–36.0)
MCV: 93.1 fL (ref 80.0–100.0)
MPV: 9.4 fL (ref 7.5–12.5)
Monocytes Relative: 8.7 %
Neutro Abs: 3254 {cells}/uL (ref 1500–7800)
Neutrophils Relative %: 66.4 %
Platelets: 189 10*3/uL (ref 140–400)
RBC: 4.08 10*6/uL (ref 3.80–5.10)
RDW: 12.7 % (ref 11.0–15.0)
Total Lymphocyte: 23.3 %
WBC: 4.9 10*3/uL (ref 3.8–10.8)

## 2023-09-15 LAB — COMPREHENSIVE METABOLIC PANEL WITH GFR
AG Ratio: 1.6 (calc) (ref 1.0–2.5)
ALT: 8 U/L (ref 6–29)
AST: 15 U/L (ref 10–35)
Albumin: 4.4 g/dL (ref 3.6–5.1)
Alkaline phosphatase (APISO): 80 U/L (ref 37–153)
BUN/Creatinine Ratio: 14 (calc) (ref 6–22)
BUN: 15 mg/dL (ref 7–25)
CO2: 24 mmol/L (ref 20–32)
Calcium: 9.9 mg/dL (ref 8.6–10.4)
Chloride: 106 mmol/L (ref 98–110)
Creat: 1.04 mg/dL — ABNORMAL HIGH (ref 0.60–0.95)
Globulin: 2.8 g/dL (ref 1.9–3.7)
Glucose, Bld: 98 mg/dL (ref 65–99)
Potassium: 4.3 mmol/L (ref 3.5–5.3)
Sodium: 141 mmol/L (ref 135–146)
Total Bilirubin: 0.7 mg/dL (ref 0.2–1.2)
Total Protein: 7.2 g/dL (ref 6.1–8.1)
eGFR: 54 mL/min/{1.73_m2} — ABNORMAL LOW (ref >=60)

## 2023-09-15 LAB — LIPID PANEL
Cholesterol: 151 mg/dL (ref <200)
HDL: 70 mg/dL (ref >=50)
LDL Cholesterol (Calc): 60 mg/dL
Non-HDL Cholesterol (Calc): 81 mg/dL (ref <130)
Total CHOL/HDL Ratio: 2.2 (calc) (ref <5.0)
Triglycerides: 129 mg/dL (ref <150)

## 2023-09-15 LAB — VITAMIN D 25 HYDROXY (VIT D DEFICIENCY, FRACTURES): Vit D, 25-Hydroxy: 28 ng/mL — ABNORMAL LOW (ref 30–100)

## 2023-09-15 LAB — HEMOGLOBIN A1C
Hgb A1c MFr Bld: 5.6 % (ref <5.7)
Mean Plasma Glucose: 114 mg/dL
eAG (mmol/L): 6.3 mmol/L

## 2023-09-15 LAB — TSH: TSH: 0.9 m[IU]/L (ref 0.40–4.50)

## 2023-09-15 LAB — VITAMIN B12: Vitamin B-12: 496 pg/mL (ref 200–1100)

## 2023-09-16 ENCOUNTER — Ambulatory Visit: Payer: Self-pay | Admitting: Internal Medicine

## 2023-09-18 NOTE — Progress Notes (Signed)
 Remote ICD transmission.

## 2023-09-21 NOTE — Telephone Encounter (Signed)
 Copied from CRM [id number]. Topic: General - Other >> Sep 21, 2023  8:58 AM Bonnie Zhang wrote: Reason for CRM: patient called back and I read the lab results to to her and she wanted to know if she should keep taking the B12 CB [phone]

## 2023-09-29 ENCOUNTER — Ambulatory Visit (INDEPENDENT_AMBULATORY_CARE_PROVIDER_SITE_OTHER)

## 2023-09-29 DIAGNOSIS — Z7901 Long term (current) use of anticoagulants: Secondary | ICD-10-CM | POA: Diagnosis not present

## 2023-09-29 LAB — POCT INR: INR: 3.8 — AB (ref 2.0–3.0)

## 2023-09-29 NOTE — Patient Instructions (Addendum)
 Pre visit review using our clinic review tool, if applicable. No additional management support is needed unless otherwise documented below in the visit note.  Hold dose tomorrow and then change weekly dose to take 1/2 tablet daily except take 1 tablet on Monday and Friday. Recheck in 3 weeks.

## 2023-09-29 NOTE — Progress Notes (Signed)
 Pt already took warfarin today. Hold dose tomorrow and then change weekly dose to take 1/2 tablet daily except take 1 tablet on Monday and Friday. Recheck in 3 weeks.

## 2023-10-05 ENCOUNTER — Ambulatory Visit: Payer: Self-pay | Admitting: Radiation Oncology

## 2023-10-13 ENCOUNTER — Other Ambulatory Visit: Payer: Self-pay | Admitting: Internal Medicine

## 2023-10-20 ENCOUNTER — Ambulatory Visit (INDEPENDENT_AMBULATORY_CARE_PROVIDER_SITE_OTHER)

## 2023-10-20 DIAGNOSIS — Z7901 Long term (current) use of anticoagulants: Secondary | ICD-10-CM

## 2023-10-20 LAB — POCT INR: INR: 2.5 (ref 2.0–3.0)

## 2023-10-20 NOTE — Patient Instructions (Addendum)
 Pre visit review using our clinic review tool, if applicable. No additional management support is needed unless otherwise documented below in the visit note.  Continue 1/2 tablet daily except take 1 tablet on Monday and Friday. Recheck in 5 weeks.

## 2023-10-20 NOTE — Progress Notes (Signed)
 Continue 1/2 tablet daily except take 1 tablet on Monday and Friday. Recheck in 5 weeks.

## 2023-10-26 ENCOUNTER — Telehealth: Payer: Self-pay | Admitting: Internal Medicine

## 2023-10-26 DIAGNOSIS — L989 Disorder of the skin and subcutaneous tissue, unspecified: Secondary | ICD-10-CM

## 2023-10-26 NOTE — Telephone Encounter (Signed)
 Copied from CRM [id number]. Topic: Referral - Request for Referral >> Oct 26, 2023  3:20 PM Burnard DEL wrote: Did the patient discuss referral with their provider in the last year? No (If No - schedule appointment) (If Yes - send message)  Appointment offered? Yes  Type of order/referral and detailed reason for visit: Dermatology/ growth under skin that was removed 10 years ago that has came back  Preference of office, provider, location: [facility] dermatology [address] [facility]  If referral order, have you been seen by this specialty before? Yes (If Yes, this issue or another issue? When? Where?  Can we respond through MyChart? No

## 2023-10-27 ENCOUNTER — Other Ambulatory Visit: Payer: Self-pay | Admitting: Cardiology

## 2023-10-27 NOTE — Telephone Encounter (Signed)
 Referral ordered

## 2023-11-02 ENCOUNTER — Ambulatory Visit: Payer: Medicare Other

## 2023-11-02 DIAGNOSIS — I441 Atrioventricular block, second degree: Secondary | ICD-10-CM | POA: Diagnosis not present

## 2023-11-03 LAB — CUP PACEART REMOTE DEVICE CHECK
Battery Voltage: 80
Date Time Interrogation Session: 20250804130544
Implantable Lead Connection Status: 753985
Implantable Lead Connection Status: 753985
Implantable Lead Implant Date: 20230203
Implantable Lead Implant Date: 20230203
Implantable Lead Location: 753859
Implantable Lead Location: 753860
Implantable Lead Model: 377171
Implantable Lead Model: 377171
Implantable Lead Serial Number: 8000600801
Implantable Lead Serial Number: 8000640919
Implantable Pulse Generator Implant Date: 20230203
Pulse Gen Model: 407145
Pulse Gen Serial Number: 70290684

## 2023-11-04 ENCOUNTER — Ambulatory Visit: Payer: Self-pay | Admitting: Cardiology

## 2023-11-04 NOTE — Progress Notes (Signed)
 Radiation Oncology         [phone] ________________________________  Name: Bonnie Zhang MRN: [id number]  Date: 11/05/2023  DOB: 07-11-1940  Follow-Up Visit Note  CC: Bonnie Glade PARAS, MD  Bonnie Glade PARAS, MD  No diagnosis found. 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 She was last seen in office on 09/29/22 for a follow up visit. Patient continued to follow up with their specialists to manage their chronic conditions.   In the interval since she was last seen, she presented for a follow up visit with Dr. Buckley on 11/13/22 complaining of chronic headaches. At that time, Dr. Buckley believed her symptoms are triggered by chronic sleep issues. She was then counseled on healthy sleep hygiene to help reduce symptoms.   Patient continued to follow up with Dr. Viktoria. She presented for a follow up on 05/15/23 where she reported feeling well overall and regularly using her dilator. she was noted as NED on examination.   No other significant oncologic interval history since the patient was last seen.  ***                           Allergies:  is allergic to myrbetriq  [mirabegron ], vicodin [hydrocodone-acetaminophen ], aspirin, hctz [hydrochlorothiazide], and lipitor [atorvastatin].  Meds: Current Outpatient Medications  Medication Sig Dispense Refill   acetaminophen  (TYLENOL ) 500 MG tablet Take 500-1,000 mg by mouth every 6 (six) hours as needed for mild pain.     amLODipine  (NORVASC ) 5 MG tablet Take 1 tablet (5 mg total) by  mouth daily. 30 tablet 3   benazepril  (LOTENSIN ) 40 MG tablet TAKE 1 TABLET BY MOUTH DAILY. 90 tablet 1   cholecalciferol  (VITAMIN D3) 25 MCG (1000 UNIT) tablet Take 2,000 Units by mouth daily.     pantoprazole  (PROTONIX ) 40 MG tablet TAKE 1 TABLET BY MOUTH ONCE DAILY AS NEEDED FOR ACID REFLUX 90 tablet 0   simvastatin  (ZOCOR ) 10 MG tablet TAKE 1 TABLET BY MOUTH AT BEDTIME. 90 tablet 1   spironolactone  (ALDACTONE ) 25 MG tablet TAKE 1 TABLET BY MOUTH DAILY. 90 tablet 2   vitamin B-12 (CYANOCOBALAMIN ) 1000 MCG tablet Take 1,000 mcg by mouth daily.     warfarin (COUMADIN ) 5 MG tablet TAKE 1/2 TABLET DAILY EXCEPT TAKE 1 TABLET ON MONDAYS, WEDNESDAYS AND FRIDAYS OR AS DIRECTED BY ANTICOAGULATION 90 tablet 1   No current facility-administered medications for this encounter.    Physical Findings: The patient is in no acute distress. Patient is alert and oriented.  vitals were not taken for this visit. .  No significant changes. Lungs are clear to auscultation bilaterally. Heart has regular rate and rhythm. No palpable cervical, supraclavicular, or axillary adenopathy. Abdomen soft, non-tender, normal bowel sounds.   On pelvic examination the external genitalia were unremarkable. A speculum exam was performed.  A medium size speculum was initially used but was unable to open the speculum.  A pediatric speculum was then used.  The proximal vagina is significantly narrowed.  Some radiation changes in  the region of the vaginal cuff.  No suspicious mucosal lesions.  On bimanual and rectovaginal examination there were no pelvic masses appreciated.  Vaginal mucosa smooth.  Rectal sphincter tone good. ***  Lab Findings: Lab Results  Component Value Date   WBC 4.9 09/14/2023   HGB 12.3 09/14/2023   HCT 38.0 09/14/2023   MCV 93.1 09/14/2023   PLT 189 09/14/2023    Radiographic Findings: CUP PACEART REMOTE DEVICE CHECK Result Date: 11/03/2023 PPM Scheduled remote reviewed. Normal device function.   Presenting rhythm: AP/VP Next remote 91 days. 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 ***  Plan:  Per NCCN guidelines, patient will follow up with Dr. Viktoria in 6 months and radiation oncology in 1 year. Patient was educated on signs/symptoms that may indicate disease recurrence and understands to notify us  if she experiences any of them.     *** minutes of total time was spent for this patient encounter, including preparation, face-to-face counseling with the patient and coordination of care, physical exam, and documentation of the encounter. ____________________________________   Leeroy Due, PA-C   Lynwood CHARM Nasuti, PhD, MD   [facility]  Radiation Oncology Direct Dial: [phone]  Fax: [phone] [email or web address]   This document serves as a record of services personally performed by Lynwood Nasuti, MD. It was created on their behalf by Reymundo Cartwright, a trained medical scribe. The creation of this record is based on the scribe's personal observations and the provider's statements to them. This document has been checked and approved by the attending provider.

## 2023-11-05 ENCOUNTER — Ambulatory Visit
Admission: RE | Admit: 2023-11-05 | Discharge: 2023-11-05 | Disposition: A | Discharge status: Home / Self Care | Payer: Medicare Other | Source: Ambulatory Visit | Attending: Radiation Oncology | Admitting: Radiation Oncology

## 2023-11-05 ENCOUNTER — Encounter: Payer: Self-pay | Admitting: Radiation Oncology

## 2023-11-05 VITALS — BP 137/60 | HR 86 | Temp 97.5°F | Resp 19 | Ht 60.0 in | Wt 151.6 lb

## 2023-11-05 DIAGNOSIS — C541 Malignant neoplasm of endometrium: Secondary | ICD-10-CM

## 2023-11-05 NOTE — Progress Notes (Signed)
 Bonnie Zhang is here today for follow up post radiation to the pelvic.   Radiation Treatment Dates: 09/06/2019 through 11/23/2019   Does the patient complain of any of the following    Pain:Denies Abdominal bloating: Denies Diarrhea/Constipation: Denies Nausea/Vomiting: Denies Vaginal Discharge: Denies Blood in Urine or Stool: Denies Urinary Issues (dysuria/incomplete emptying/ incontinence/ increased frequency/urgency): Yes, using as instructed.  Does patient report using vaginal dilator 2-3 times a week and/or sexually active 2-3 weeks:  Yes, using as instructed. Last known use was yesterday. She reports a pinkish tinge when using dilators. Post radiation skin changes: Denies        BP 137/60 (BP Location: Left Arm, Patient Position: Sitting)   Pulse 86   Temp (!) 97.5 F (36.4 C) (Oral)   Resp 19   Ht 5' (1.524 m)   Wt 161 lb 9.6 oz (73.3 kg)   SpO2 100%   BMI 31.56 kg/m

## 2023-11-17 ENCOUNTER — Ambulatory Visit

## 2023-11-17 DIAGNOSIS — Z7901 Long term (current) use of anticoagulants: Secondary | ICD-10-CM | POA: Diagnosis not present

## 2023-11-17 LAB — POCT INR: INR: 2.6 (ref 2.0–3.0)

## 2023-11-17 MED ORDER — WARFARIN SODIUM 5 MG PO TABS: ORAL_TABLET | ORAL | 1 refills | Status: AC

## 2023-11-17 NOTE — Progress Notes (Signed)
 Continue 1/2 tablet daily except take 1 tablet on Monday and Friday. Recheck in 6 weeks.  Pt is compliant with warfarin management and PCP apts.  Sent in refill of warfarin to requested pharmacy.

## 2023-11-17 NOTE — Patient Instructions (Addendum)
 Pre visit review using our clinic review tool, if applicable. No additional management support is needed unless otherwise documented below in the visit note.  Continue 1/2 tablet daily except take 1 tablet on Monday and Friday. Recheck in 6 weeks.

## 2023-11-19 ENCOUNTER — Ambulatory Visit: Attending: Cardiology | Admitting: Cardiology

## 2023-11-19 VITALS — BP 120/64 | HR 81 | Ht 60.0 in | Wt 153.3 lb

## 2023-11-19 DIAGNOSIS — I10 Essential (primary) hypertension: Secondary | ICD-10-CM | POA: Diagnosis not present

## 2023-11-19 DIAGNOSIS — E7800 Pure hypercholesterolemia, unspecified: Secondary | ICD-10-CM

## 2023-11-19 DIAGNOSIS — I441 Atrioventricular block, second degree: Secondary | ICD-10-CM

## 2023-11-19 MED ORDER — AMLODIPINE BESYLATE 5 MG PO TABS: 5.0000 mg | ORAL_TABLET | Freq: Every day | ORAL | 3 refills | Status: DC

## 2023-11-19 NOTE — Patient Instructions (Signed)
 Medication Instructions:  - STOP amlodipine    *If you need a refill on your cardiac medications before your next appointment, please call your pharmacy*  Lab Work: No labs ordered today  If you have labs (blood work) drawn today and your tests are completely normal, you will receive your results only by: MyChart Message (if you have MyChart) OR A paper copy in the mail If you have any lab test that is abnormal or we need to change your treatment, we will call you to review the results.  Testing/Procedures: No test ordered today   Follow-Up: At [facility], you and your health needs are our priority.  As part of our continuing mission to provide you with exceptional heart care, our providers are all part of one team.  This team includes your primary Cardiologist (physician) and Advanced Practice Providers or APPs (Physician Assistants and Nurse Practitioners) who all work together to provide you with the care you need, when you need it.  Your next appointment:   1 year(s)  Provider:   You may see Redell Cave, MD or one of the following Advanced Practice Providers on your designated Care Team:   Lonni Meager, NP Lesley Maffucci, PA-C Bernardino Bring, PA-C Cadence Russell, PA-C Tylene Lunch, NP Barnie Hila, NP    We recommend signing up for the patient portal called MyChart.  Sign up information is provided on this After Visit Summary.  MyChart is used to connect with patients for Virtual Visits (Telemedicine).  Patients are able to view lab/test results, encounter notes, upcoming appointments, etc.  Non-urgent messages can be sent to your provider as well.   To learn more about what you can do with MyChart, go to [email or web address].

## 2023-11-19 NOTE — Progress Notes (Signed)
 Cardiology Office Note:    Date:  11/19/2023   ID:  Bonnie Zhang, DOB 02-02-1941, MRN [id number]  PCP:  Geofm Glade PARAS, MD   [facility] Providers Cardiologist:  Redell Cave, MD Electrophysiologist:  OLE ONEIDA HOLTS, MD     Referring MD: Geofm Glade PARAS, MD   Chief Complaint  Patient presents with   3 month fu    Doing okay since last visit. Having better energy and not as fatigued.    History of Present Illness:    Bonnie Zhang is a 83 y.o. 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   Last seen due to fatigue, dizziness, low normal BP.  Amlodipine  reduced to 5 mg daily from 10 with good effect.  States feeling more energetic although she still has some episodes of fatigue but not as prior.  Denies syncope.  No new concerns at this time.  Prior notes Coronary CT 6/24 minimal RCA stenosis less than 25%, calcium score 1.68, 10 percentile. Echo 09/2021 EF 60 to 65% Echo 09/2020 EF 60 to 65%, grade 2 diastolic dysfunction. Lexiscan  09/2020, no evidence for ischemia. Echo 2015 normal systolic function, impaired relaxation, EF 55 to 60%   Past Medical History:  Diagnosis Date   Anticoagulated on Coumadin  2001   2 blood clots   Bruises easily    pt is on Coumadin ;last dose of Coumadin  08/05/11 and then Lovenox  started 08/07/11   DVT (deep vein thrombosis) in pregnancy     1965 post C section;2001 with prolonged driving while on  HRT   DVT (deep venous thrombosis) (HCC) 2001   Was on Prempro.     Endometrioid adenocarcinoma of uterus (HCC)    Gallstones    GERD (gastroesophageal reflux disease)    Protonix  prn   High cholesterol    takes Simvastating daily   History of colon polyps 2007   Dr Jakie   History of radiation therapy 09/06/2019-11/23/2019   IMRT Vagina/Pelvis and Vagina HDR 4 fx; Dr. Lynwood Nasuti   Homocysteinemia 01/23/2015   Hx  of migraines    yrs ago    Hypertension    takes Benazepril ,Amlodipine ,and Metoprolol  daily   Osteoporosis    PONV (postoperative nausea and vomiting)    Primary localized osteoarthritis of left knee    PVD (peripheral vascular disease) (HCC) 1965, 2001   abnormal venous doppler findings due to recurrent DVT'S left leg   Right knee DJD    knees   Shortness of breath dyspnea    TIA (transient ischemic attack) 2020   Vitamin B12 deficiency (non anemic) 01/24/2015   Vitamin D  deficiency    takes VIt D daily    Past Surgical History:  Procedure Laterality Date   ABDOMINAL HYSTERECTOMY     APPENDECTOMY  04/01/1963   BREAST LUMPECTOMY  03/31/1985   CESAREAN SECTION  1961/1965/1966   X 3   CHOLECYSTECTOMY  04/01/1995   COLONOSCOPY W/ POLYPECTOMY  03/31/2005   Dr  Jakie; due? 2012   DILATION AND CURETTAGE OF UTERUS  03/31/2010   uterine polyp, Dr Fredirick   ESOPHAGOGASTRODUODENOSCOPY  04/01/1995   HYSTEROSCOPY WITH D & C N/A 08/05/2012   Procedure: DILATATION AND CURETTAGE /HYSTEROSCOPY;  Surgeon: Glenys GORMAN Fredirick, MD;  Location: [facility] ORS;  Service: Gynecology;  Laterality: N/A;   PACEMAKER IMPLANT N/A 05/03/2021   Procedure: PACEMAKER IMPLANT;  Surgeon: Waddell Danelle ORN, MD;  Location: [facility] INVASIVE  CV LAB;  Service: Cardiovascular;  Laterality: N/A;   ROBOTIC ASSISTED TOTAL HYSTERECTOMY WITH BILATERAL SALPINGO OOPHERECTOMY Right 09/14/2012   Procedure: ROBOTIC ASSISTED TOTAL HYSTERECTOMY WITH BILATERAL SALPINGO OOPHORECTOMY ,right pelvic LYMPHNODE dissection;  Surgeon: Elenore A. Dodie, MD;  Location: [facility] ORS;  Service: Gynecology;  Laterality: Right;   TOTAL KNEE ARTHROPLASTY  08/11/2011   Procedure: TOTAL KNEE ARTHROPLASTY;  Surgeon: Lamar DELENA Millman, MD;  Location: [facility] OR;  Service: Orthopedics;  Laterality: Right;  DR MILLMAN CHRISTIAN 90 MINUTES FOR THIS CASE   TOTAL KNEE ARTHROPLASTY Left 09/11/2014   Procedure: TOTAL KNEE ARTHROPLASTY;  Surgeon: Lamar Millman, MD;  [facility];  Service:  Orthopedics;  Laterality: Left;   WISDOM TOOTH EXTRACTION      Current Medications: Current Meds  Medication Sig   acetaminophen  (TYLENOL ) 500 MG tablet Take 500-1,000 mg by mouth every 6 (six) hours as needed for mild pain.   benazepril  (LOTENSIN ) 40 MG tablet TAKE 1 TABLET BY MOUTH DAILY.   cholecalciferol  (VITAMIN D3) 25 MCG (1000 UNIT) tablet Take 2,000 Units by mouth daily.   pantoprazole  (PROTONIX ) 40 MG tablet TAKE 1 TABLET BY MOUTH ONCE DAILY AS NEEDED FOR ACID REFLUX   simvastatin  (ZOCOR ) 10 MG tablet TAKE 1 TABLET BY MOUTH AT BEDTIME.   spironolactone  (ALDACTONE ) 25 MG tablet TAKE 1 TABLET BY MOUTH DAILY.   vitamin B-12 (CYANOCOBALAMIN ) 1000 MCG tablet Take 1,000 mcg by mouth daily.   warfarin (COUMADIN ) 5 MG tablet TAKE 1/2 TABLET BY MOUTH DAILY EXCEPT TAKE 1 TABLET ON MONDAYS AND FRIDAYS OR AS DIRECTED BY ANTICOAGULATION CLINIC   [DISCONTINUED] amLODipine  (NORVASC ) 5 MG tablet Take 1 tablet (5 mg total) by mouth daily.     Allergies:   Myrbetriq  [mirabegron ], Vicodin [hydrocodone-acetaminophen ], Aspirin, Hctz [hydrochlorothiazide], and Lipitor [atorvastatin]   Social History   Socioeconomic History   Marital status: Divorced    Spouse name: Not on file   Number of children: 2   Years of education: Not on file   Highest education level: High school graduate  Occupational History   Occupation: Retired  Tobacco Use   Smoking status: Never    Passive exposure: Never   Smokeless tobacco: Never  Vaping Use   Vaping status: Never Used  Substance and Sexual Activity   Alcohol use: No   Drug use: No   Sexual activity: Not Currently    Birth control/protection: Post-menopausal    Comment: retired. has 2 daughters  Other Topics Concern   Not on file  Social History Narrative   Pt lives alone she has 2 daughter - right handed- drinks tea, soda sometimes - No regular exercise   Social Drivers of Health   Financial Resource Strain: Low Risk  (03/12/2023)   Overall  Financial Resource Strain (CARDIA)    Difficulty of Paying Living Expenses: Not hard at all  Food Insecurity: No Food Insecurity (03/12/2023)   Hunger Vital Sign    Worried About Running Out of Food in the Last Year: Never true    Ran Out of Food in the Last Year: Never true  Transportation Needs: No Transportation Needs (03/12/2023)   PRAPARE - Administrator, Civil Service (Medical): No    Lack of Transportation (Non-Medical): No  Physical Activity: Inactive (03/12/2023)   Exercise Vital Sign    Days of Exercise per Week: 0 days    Minutes of Exercise per Session: 0 min  Stress: No Stress Concern Present (03/12/2023)   [employer] of Occupational Health -  Occupational Stress Questionnaire    Feeling of Stress : Not at all  Social Connections: Moderately Integrated (03/12/2023)   Social Connection and Isolation Panel    Frequency of Communication with Friends and Family: More than three times a week    Frequency of Social Gatherings with Friends and Family: Once a week    Attends Religious Services: More than 4 times per year    Active Member of [employer] or Organizations: Yes    Attends Banker Meetings: Never    Marital Status: Divorced     Family History: The patient's family history includes Colon cancer in her maternal grandmother; Deep vein thrombosis in her mother; Diabetes in an other family member; Heart failure in her maternal grandfather; Hypertension in her mother and sister; Kidney disease in her mother; Leukemia in her father; Liver disease in her brother. There is no history of Anesthesia problems, Hypotension, Malignant hyperthermia, Pseudochol deficiency, Stroke, Heart disease, Breast cancer, Ovarian cancer, Uterine cancer, Pancreatic cancer, or Cancer - Prostate.  ROS:   Please see the history of present illness.     All other systems reviewed and are negative.  EKGs/Labs/Other Studies Reviewed:    The following studies were reviewed  today:         Recent Labs: 09/14/2023: ALT 8; BUN 15; Creat 1.04; Hemoglobin 12.3; Platelets 189; Potassium 4.3; Sodium 141; TSH 0.90  Recent Lipid Panel    Component Value Date/Time   CHOL 151 09/14/2023 1038   CHOL 178 05/25/2014 1121   TRIG 129 09/14/2023 1038   TRIG 138 05/25/2014 1121   HDL 70 09/14/2023 1038   HDL 76 05/25/2014 1121   CHOLHDL 2.2 09/14/2023 1038   VLDL 27.6 03/16/2023 1106   LDLCALC 60 09/14/2023 1038   LDLCALC 74 05/25/2014 1121     Risk Assessment/Calculations:          Physical Exam:    VS:  BP 120/64 (BP Location: Left Arm, Patient Position: Sitting, Cuff Size: Normal)   Pulse 81   Ht 5' (1.524 m)   Wt 153 lb 4.8 oz (69.5 kg)   SpO2 96%   BMI 29.94 kg/m     Wt Readings from Last 3 Encounters:  11/19/23 153 lb 4.8 oz (69.5 kg)  11/05/23 151 lb 9.6 oz (68.8 kg)  09/14/23 154 lb (69.9 kg)     GEN:  Well nourished, well developed in no acute distress HEENT: Normal NECK: No JVD; No carotid bruits CARDIAC: RRR, no murmurs, rubs, gallops RESPIRATORY:  Clear to auscultation without rales, wheezing or rhonchi  ABDOMEN: Soft, non-tender, non-distended MUSCULOSKELETAL:  No edema; No deformity  SKIN: Warm and dry NEUROLOGIC:  Alert and oriented x 3 PSYCHIATRIC:  Normal affect   ASSESSMENT:    1. AV block, Mobitz 2   2. Primary hypertension   3. Pure hypercholesterolemia    PLAN:    In order of problems listed above:  Mobitz 2 AV block s/p PPM, 05/03/2021.  EKG showing a sensed V paced rhythm.  Echo 10/2021 EF 60 to 65%.  Continue pacemaker checks with device clinic. Hypertension, BP controlled.  Symptoms of fatigue improved since reducing Norvasc .  Stop Norvasc .  Continue Aldactone  25 mg daily, benazepril  40 mg daily.  Monitor BP at home and keep a log. Hyperlipidemia, cholesterol controlled.  Continue simvastatin .  10 mg daily  Follow-up in 6 months.   Medication Adjustments/Labs and Tests Ordered: Current medicines are reviewed  at length with the patient today.  Concerns regarding  medicines are outlined above.  No orders of the defined types were placed in this encounter.   Meds ordered this encounter  Medications   DISCONTD: amLODipine  (NORVASC ) 5 MG tablet    Sig: Take 1 tablet (5 mg total) by mouth daily.    Dispense:  90 tablet    Refill:  3      Patient Instructions  Medication Instructions:  - STOP amlodipine    *If you need a refill on your cardiac medications before your next appointment, please call your pharmacy*  Lab Work: No labs ordered today  If you have labs (blood work) drawn today and your tests are completely normal, you will receive your results only by: MyChart Message (if you have MyChart) OR A paper copy in the mail If you have any lab test that is abnormal or we need to change your treatment, we will call you to review the results.  Testing/Procedures: No test ordered today   Follow-Up: At [facility], you and your health needs are our priority.  As part of our continuing mission to provide you with exceptional heart care, our providers are all part of one team.  This team includes your primary Cardiologist (physician) and Advanced Practice Providers or APPs (Physician Assistants and Nurse Practitioners) who all work together to provide you with the care you need, when you need it.  Your next appointment:   1 year(s)  Provider:   You may see Redell Cave, MD or one of the following Advanced Practice Providers on your designated Care Team:   Lonni Meager, NP Lesley Maffucci, PA-C Bernardino Bring, PA-C Cadence [place], PA-C Tylene Lunch, NP Barnie Hila, NP    We recommend signing up for the patient portal called MyChart.  Sign up information is provided on this After Visit Summary.  MyChart is used to connect with patients for Virtual Visits (Telemedicine).  Patients are able to view lab/test results, encounter notes, upcoming appointments, etc.  Non-urgent  messages can be sent to your provider as well.   To learn more about what you can do with MyChart, go to [email or web address].          Signed, Redell Cave, MD  11/19/2023 11:06 AM    [facility]

## 2023-12-14 ENCOUNTER — Encounter: Payer: Self-pay | Admitting: Dermatology

## 2023-12-14 ENCOUNTER — Ambulatory Visit: Admitting: Dermatology

## 2023-12-14 VITALS — BP 118/62 | HR 75

## 2023-12-14 DIAGNOSIS — L570 Actinic keratosis: Secondary | ICD-10-CM

## 2023-12-14 DIAGNOSIS — Z872 Personal history of diseases of the skin and subcutaneous tissue: Secondary | ICD-10-CM | POA: Diagnosis not present

## 2023-12-14 DIAGNOSIS — D485 Neoplasm of uncertain behavior of skin: Secondary | ICD-10-CM | POA: Diagnosis not present

## 2023-12-14 NOTE — Progress Notes (Signed)
   New Patient Visit   Subjective  Bonnie Zhang is a 83 y.o. female who presents for the following: Spots of concern  Lesion on left forearm, present for several years ago. She states that it was treated previously by other physician. Unsure if it was drained. It is tender and she would like it removed.   The following portions of the chart were reviewed this encounter and updated as appropriate: medications, allergies, medical history  Review of Systems:  No other skin or systemic complaints except as noted in HPI or Assessment and Plan.  Objective  Well appearing patient in no apparent distress; mood and affect are within normal limits.   A focused examination was performed of the following areas: Left arm  Relevant exam findings are noted in the Assessment and Plan.  Left Forearm - Posterior 2.1 x 2.1 cm subcutaneous nodule  Assessment & Plan   HISTORY OF PRECANCEROUS ACTINIC KERATOSIS - site(s) of PreCancerous Actinic Keratosis clear today. - these may recur and new lesions may form requiring treatment to prevent transformation into skin cancer - observe for new or changing spots and contact [facility] for appointment if occur - photoprotection with sun protective clothing; sunglasses and broad spectrum sunscreen with SPF of at least 30 + and frequent self skin exams recommended - yearly exams by a dermatologist recommended for persons with history of PreCancerous Actinic Keratoses   NEOPLASM OF UNCERTAIN BEHAVIOR OF SKIN Left Forearm - Posterior Likely cyst, patient would like removed.  We discussed treatment options including observation vs excision.  Patient would like it removed.  Patient will schedule upon check out today. ACTINIC KERATOSES    Return for cyst removal.  I, Berwyn Lesches, Surg Tech III, am acting as scribe for RUFUS CHRISTELLA HOLY, MD.   Documentation: I have reviewed the above documentation for accuracy and completeness, and I agree with  the above.  RUFUS CHRISTELLA HOLY, MD

## 2023-12-14 NOTE — Patient Instructions (Signed)
 Important Information  Due to recent changes in healthcare laws, you may see results of your pathology and/or laboratory studies on MyChart before the doctors have had a chance to review them. We understand that in some cases there may be results that are confusing or concerning to you. Please understand that not all results are received at the same time and often the doctors may need to interpret multiple results in order to provide you with the best plan of care or course of treatment. Therefore, we ask that you please give us  2 business days to thoroughly review all your results before contacting the office for clarification. Should we see a critical lab result, you will be contacted sooner.   If You Need Anything After Your Visit  If you have any questions or concerns for your doctor, please call our main line at [phone] If no one answers, please leave a voicemail as directed and we will return your call as soon as possible. Messages left after 4 pm will be answered the following business day.   You may also send us  a message via MyChart. We typically respond to MyChart messages within 1-2 business days.  For prescription refills, please ask your pharmacy to contact our office. Our fax number is [phone].  If you have an urgent issue when the clinic is closed that cannot wait until the next business day, you can page your doctor at the number below.    Please note that while we do our best to be available for urgent issues outside of office hours, we are not available 24/7.   If you have an urgent issue and are unable to reach us , you may choose to seek medical care at your doctor's office, retail clinic, urgent care center, or emergency room.  If you have a medical emergency, please immediately call 911 or go to the emergency department. In the event of inclement weather, please call our main line at [phone] for an update on the status of any delays or  closures.  Dermatology Medication Tips: Please keep the boxes that topical medications come in in order to help keep track of the instructions about where and how to use these. Pharmacies typically print the medication instructions only on the boxes and not directly on the medication tubes.   If your medication is too expensive, please contact our office at [phone] or send us  a message through MyChart.   We are unable to tell what your co-pay for medications will be in advance as this is different depending on your insurance coverage. However, we may be able to find a substitute medication at lower cost or fill out paperwork to get insurance to cover a needed medication.   If a prior authorization is required to get your medication covered by your insurance company, please allow us  1-2 business days to complete this process.  Drug prices often vary depending on where the prescription is filled and some pharmacies may offer cheaper prices.  The website www.goodrx.com contains coupons for medications through different pharmacies. The prices here do not account for what the cost may be with help from insurance (it may be cheaper with your insurance), but the website can give you the price if you did not use any insurance.  - You can print the associated coupon and take it with your prescription to the pharmacy.  - You may also stop by our office during regular business hours and pick up a GoodRx coupon card.  - If  you need your prescription sent electronically to a different pharmacy, notify our office through [facility] or by phone at [phone]    Skin Education :   I counseled the patient regarding the following: Sun screen (SPF 30 or greater) should be applied during peak UV exposure (between 10am and 2pm) and reapplied after exercise or swimming.  The ABCDEs of melanoma were reviewed with the patient, and the importance of monthly self-examination of moles was emphasized.  Should any moles change in shape or color, or itch, bleed or burn, pt will contact our office for evaluation sooner then their interval appointment.  Plan: Sunscreen Recommendations I recommended a broad spectrum sunscreen with a SPF of 30 or higher. I explained that SPF 30 sunscreens block approximately 97 percent of the sun's harmful rays. Sunscreens should be applied at least 15 minutes prior to expected sun exposure and then every 2 hours after that as long as sun exposure continues. If swimming or exercising sunscreen should be reapplied every 45 minutes to an hour after getting wet or sweating. One ounce, or the equivalent of a shot glass full of sunscreen, is adequate to protect the skin not covered by a bathing suit. I also recommended a lip balm with a sunscreen as well. Sun protective clothing can be used in lieu of sunscreen but must be worn the entire time you are exposed to the sun's rays.  Patient Handout: Wound Care for Skin Biopsy Site  Taking Care of Your Skin Biopsy Site  Proper care of the biopsy site is essential for promoting healing and minimizing scarring. This handout provides instructions on how to care for your biopsy site to ensure optimal recovery.  1. Cleaning the Wound:  Clean the biopsy site daily with gentle soap and water. Gently pat the area dry with a clean, soft towel. Avoid harsh scrubbing or rubbing the area, as this can irritate the skin and delay healing.  2. Applying Aquaphor and Bandage:  After cleaning the wound, apply a thin layer of Aquaphor ointment to the biopsy site. Cover the area with a sterile bandage to protect it from dirt, bacteria, and friction. Change the bandage daily or as needed if it becomes soiled or wet.  3. Continued Care for One Week:  Repeat the cleaning, Aquaphor application, and bandaging process daily for one week following the biopsy procedure. Keeping the wound clean and moist during this initial healing period will help  prevent infection and promote optimal healing.  4. Massaging Aquaphor into the Area:  ---After one week, discontinue the use of bandages but continue to apply Aquaphor to the biopsy site. ----Gently massage the Aquaphor into the area using circular motions. ---Massaging the skin helps to promote circulation and prevent the formation of scar tissue.   Additional Tips:  Avoid exposing the biopsy site to direct sunlight during the healing process, as this can cause hyperpigmentation or worsen scarring. If you experience any signs of infection, such as increased redness, swelling, warmth, or drainage from the wound, contact your healthcare provider immediately. Follow any additional instructions provided by your healthcare provider for caring for the biopsy site and managing any discomfort. Conclusion:  Taking proper care of your skin biopsy site is crucial for ensuring optimal healing and minimizing scarring. By following these instructions for cleaning, applying Aquaphor, and massaging the area, you can promote a smooth and successful recovery. If you have any questions or concerns about caring for your biopsy site, don't hesitate to contact your healthcare  provider for guidance.

## 2023-12-28 NOTE — Progress Notes (Signed)
 Remote ICD Transmission

## 2023-12-29 ENCOUNTER — Ambulatory Visit

## 2024-01-01 ENCOUNTER — Ambulatory Visit

## 2024-01-01 DIAGNOSIS — Z7901 Long term (current) use of anticoagulants: Secondary | ICD-10-CM | POA: Diagnosis not present

## 2024-01-01 LAB — POCT INR: INR: 2.4 (ref 2.0–3.0)

## 2024-01-01 NOTE — Patient Instructions (Addendum)
 Pre visit review using our clinic review tool, if applicable. No additional management support is needed unless otherwise documented below in the visit note.  Continue 1/2 tablet daily except take 1 tablet on Monday and Friday. Recheck in 6 weeks.

## 2024-01-01 NOTE — Progress Notes (Signed)
 Amlodipine  was discontinued by cardiology. Continue 1/2 tablet daily except take 1 tablet on Monday and Friday. Recheck in 6 weeks.

## 2024-01-26 ENCOUNTER — Encounter: Payer: Self-pay | Admitting: Internal Medicine

## 2024-01-26 NOTE — Progress Notes (Signed)
 Pharmacy Quality Measure Review  This patient is appearing on a report for being at risk of failing the adherence measure for cholesterol (statin) medications this calendar year.   Medication: Simvastatin  10mg  Last fill date: 10/28 for 90 day supply  Insurance report was not up to date. No action needed at this time.    Angela Baalmann, PharmD [facility] Pharmacist

## 2024-02-01 ENCOUNTER — Ambulatory Visit (INDEPENDENT_AMBULATORY_CARE_PROVIDER_SITE_OTHER): Payer: Medicare Other

## 2024-02-01 DIAGNOSIS — I441 Atrioventricular block, second degree: Secondary | ICD-10-CM | POA: Diagnosis not present

## 2024-02-01 LAB — CUP PACEART REMOTE DEVICE CHECK
Date Time Interrogation Session: 20251103111926
Implantable Lead Connection Status: 753985
Implantable Lead Connection Status: 753985
Implantable Lead Implant Date: 20230203
Implantable Lead Implant Date: 20230203
Implantable Lead Location: 753859
Implantable Lead Location: 753860
Implantable Lead Model: 377171
Implantable Lead Model: 377171
Implantable Lead Serial Number: 8000600801
Implantable Lead Serial Number: 8000640919
Implantable Pulse Generator Implant Date: 20230203
Pulse Gen Model: 407145
Pulse Gen Serial Number: 70290684

## 2024-02-03 NOTE — Progress Notes (Signed)
 Remote PPM Transmission

## 2024-02-05 ENCOUNTER — Ambulatory Visit: Payer: Self-pay | Admitting: Cardiology

## 2024-02-10 ENCOUNTER — Telehealth: Payer: Self-pay | Admitting: Cardiology

## 2024-02-10 NOTE — Telephone Encounter (Signed)
 Patient stated she was returning staff call.

## 2024-02-10 NOTE — Telephone Encounter (Signed)
 Pt reports missed call from [phone] and no message left. Pt apprised of no evidence found for reason of call - while on phone pt requested that I make her 6 month f/u appt.  Appt made and pt thankful. Pt denies any problems.

## 2024-02-12 ENCOUNTER — Ambulatory Visit

## 2024-02-12 DIAGNOSIS — Z7901 Long term (current) use of anticoagulants: Secondary | ICD-10-CM | POA: Diagnosis not present

## 2024-02-12 LAB — POCT INR: INR: 2.2 (ref 2.0–3.0)

## 2024-02-12 NOTE — Progress Notes (Signed)
 Indication: DVT, Factor V Leiden Pt will have cyst removed, for the second time, from her arm. She has an apt with dermatology on 11/17 and they requested she come in today for INR check. They did not request any hold on warfarin.  Continue 1/2 tablet daily except take 1 tablet on Monday and Friday. Recheck in 5 weeks.

## 2024-02-12 NOTE — Patient Instructions (Addendum)
 Pre visit review using our clinic review tool, if applicable. No additional management support is needed unless otherwise documented below in the visit note.  Continue 1/2 tablet daily except take 1 tablet on Monday and Friday. Recheck in 5 weeks.

## 2024-02-15 ENCOUNTER — Encounter: Payer: Self-pay | Admitting: Dermatology

## 2024-02-15 ENCOUNTER — Ambulatory Visit: Admitting: Dermatology

## 2024-02-15 VITALS — BP 116/60 | HR 77 | Temp 97.7°F

## 2024-02-15 DIAGNOSIS — D485 Neoplasm of uncertain behavior of skin: Secondary | ICD-10-CM

## 2024-02-15 DIAGNOSIS — C4491 Basal cell carcinoma of skin, unspecified: Secondary | ICD-10-CM

## 2024-02-15 DIAGNOSIS — C44619 Basal cell carcinoma of skin of left upper limb, including shoulder: Secondary | ICD-10-CM

## 2024-02-15 HISTORY — DX: Basal cell carcinoma of skin, unspecified: C44.91

## 2024-02-15 NOTE — Progress Notes (Signed)
 Follow-Up Visit   Subjective  Bonnie Zhang is a 83 y.o. female who presents for the following: Excision of a subcutaneous nodule of the left forearm.   The following portions of the chart were reviewed this encounter and updated as appropriate: medications, allergies, medical history  Review of Systems:  No other skin or systemic complaints except as noted in HPI or Assessment and Plan.  Objective  Well appearing patient in no apparent distress; mood and affect are within normal limits.  A focused examination was performed of the following areas: Left forearm Relevant physical exam findings are noted in the Assessment and Plan.   Left Forearm 2.3 x 2.1 cm subcutaneous nodule    Assessment & Plan   NEOPLASM OF UNCERTAIN BEHAVIOR OF SKIN Left Forearm Skin excision  Excision method:  elliptical Lesion length (cm):  2.3 Lesion width (cm):  2 Margin per side (cm):  0.1 Total excision diameter (cm):  2.5 Informed consent: discussed and consent obtained   Timeout: patient name, date of birth, surgical site, and procedure verified   Procedure prep:  Patient was prepped and draped in usual sterile fashion Prep type:  Chlorhexidine  Anesthesia: the lesion was anesthetized in a standard fashion   Anesthetic:  1% lidocaine  w/ epinephrine  1-100,000 buffered w/ 8.4% NaHCO3 Instrument used: #15 blade   Hemostasis achieved with: suture, pressure and electrodesiccation   Outcome: patient tolerated procedure well with no complications   Post-procedure details: sterile dressing applied and wound care instructions given   Dressing type: bandage and pressure dressing   Additional details:  Final length 4 cm  Skin repair Complexity:  Complex Final length (cm):  4 Informed consent: discussed and consent obtained   Timeout: patient name, date of birth, surgical site, and procedure verified   Procedure prep:  Patient was prepped and draped in usual sterile fashion Prep type:   Chlorhexidine  Anesthesia: the lesion was anesthetized in a standard fashion   Anesthetic:  1% lidocaine  w/ epinephrine  1-100,000 buffered w/ 8.4% NaHCO3 Reason for type of repair: reduce tension to allow closure, allow closure of the large defect, preserve normal anatomy and allow side-to-side closure without requiring a flap or graft   Undermining: area extensively undermined   Subcutaneous layers (deep stitches):  Suture size:  4-0 Suture type: Vicryl (polyglactin 910)   Stitches:  Buried vertical mattress Fine/surface layer approximation (top stitches):  Suture type: cyanoacrylate tissue glue   Hemostasis achieved with: suture, pressure and electrodesiccation Outcome: patient tolerated procedure well with no complications   Post-procedure details: sterile dressing applied and wound care instructions given   Dressing type: bandage and pressure dressing    Specimen 1 - Surgical pathology Differential Diagnosis: R/O cyst vs other  Check Margins: No  The surgical wound was then cleaned, prepped, and re-anesthetized as above. Wound edges were undermined extensively along at least one entire edge and at a distance equal to or greater than the width of the defect (see wound defect size above) in order to achieve closure and decrease wound tension and anatomic distortion. Redundant tissue repair including standing cone removal was performed. Hemostasis was achieved with electrocautery. Subcutaneous and epidermal tissues were approximated with the above sutures. The surgical site was then lightly scrubbed with sterile, saline-soaked gauze. Steri-strips were applied, and the area was then bandaged using Vaseline ointment, non-adherent gauze, gauze pads, and tape to provide an adequate pressure dressing. The patient tolerated the procedure well, was given detailed written and verbal wound care instructions, and was  discharged in good condition.   The patient will follow-up: PRN.  Return if symptoms  worsen or fail to improve.  I, Doyce Pan, CMA, am acting as scribe for RUFUS CHRISTELLA HOLY, MD.   Documentation: I have reviewed the above documentation for accuracy and completeness, and I agree with the above.  RUFUS CHRISTELLA HOLY, MD

## 2024-02-15 NOTE — Patient Instructions (Signed)

## 2024-02-16 LAB — SURGICAL PATHOLOGY

## 2024-02-21 ENCOUNTER — Ambulatory Visit: Payer: Self-pay | Admitting: Dermatology

## 2024-02-22 ENCOUNTER — Telehealth: Payer: Self-pay | Admitting: Cardiology

## 2024-02-22 ENCOUNTER — Telehealth: Payer: Self-pay | Admitting: Student

## 2024-02-22 ENCOUNTER — Telehealth: Payer: Self-pay

## 2024-02-22 NOTE — Telephone Encounter (Signed)
  Patient called Answering Service with concerns about elevated BP. She states over the weekend BP has been high in the 160-170s and has had an intermittent mild headache and dizziness with this. She denies any chest pain, shortness of breath, or other stroke like symptoms.  She states she called our office earlier today but has not gotten a response. She takes Benazepril  40mg  daily and Spironolactone  25mg  daily. She was previously on Amlodipine  but this was weaned off in 10/2023 due to mild lightheadedness and her diastolic BP running on the lower side. She does states she took 2 doses of Amlodipine  yesterday without any improvement. She has has checked her BP 5-6 times today and most recent BP was 151/85. She states her BP was well controlled until a couple of days ago. Wonder if anxiety/ stress is playing a role. Recommended restarting Amlodipine  5mg  daily and keeping a BP log (advised patient not to check BP more than twice day unless she is having symptoms). Reviewed ED precautions. Patient voiced understanding and thanked me for calling.  Jasia Hiltunen E Cortnie Ringel, PA-C 02/22/2024 6:50 PM

## 2024-02-22 NOTE — Telephone Encounter (Signed)
 Spoke with pt gave her bx results and treatment recommendations

## 2024-02-22 NOTE — Telephone Encounter (Signed)
 Pt c/o BP issue: STAT if pt c/o blurred vision, one-sided weakness or slurred speech.  STAT if BP is GREATER than 180/120 TODAY.  STAT if BP is LESS than 90/60 and SYMPTOMATIC TODAY  1. What is your BP concern? High systolic   2. Have you taken any BP medication today? Yes   3. What are your last 5 BP readings? 159/77 this morning, 163/75 yesterday,   4. Are you having any other symptoms (ex. Dizziness, headache, blurred vision, passed out)? Headache, dizzy

## 2024-02-23 ENCOUNTER — Encounter: Payer: Self-pay | Admitting: Internal Medicine

## 2024-02-23 NOTE — Progress Notes (Signed)
    Subjective:    Patient ID: Bonnie Zhang, female    DOB: 1940/12/06, 83 y.o.   MRN: [id number]      HPI Bonnie Zhang is here for No chief complaint on file.        Medications and allergies reviewed with patient and updated if appropriate.  Current Outpatient Medications on File Prior to Visit  Medication Sig Dispense Refill   acetaminophen  (TYLENOL ) 500 MG tablet Take 500-1,000 mg by mouth every 6 (six) hours as needed for mild pain.     benazepril  (LOTENSIN ) 40 MG tablet TAKE 1 TABLET BY MOUTH DAILY. 90 tablet 1   cholecalciferol  (VITAMIN D3) 25 MCG (1000 UNIT) tablet Take 2,000 Units by mouth daily.     pantoprazole  (PROTONIX ) 40 MG tablet TAKE 1 TABLET BY MOUTH ONCE DAILY AS NEEDED FOR ACID REFLUX 90 tablet 0   simvastatin  (ZOCOR ) 10 MG tablet TAKE 1 TABLET BY MOUTH AT BEDTIME. 90 tablet 1   spironolactone  (ALDACTONE ) 25 MG tablet TAKE 1 TABLET BY MOUTH DAILY. 90 tablet 2   vitamin B-12 (CYANOCOBALAMIN ) 1000 MCG tablet Take 1,000 mcg by mouth daily.     warfarin (COUMADIN ) 5 MG tablet TAKE 1/2 TABLET BY MOUTH DAILY EXCEPT TAKE 1 TABLET ON MONDAYS AND FRIDAYS OR AS DIRECTED BY ANTICOAGULATION CLINIC 90 tablet 1   No current facility-administered medications on file prior to visit.    Review of Systems     Objective:  There were no vitals filed for this visit. BP Readings from Last 3 Encounters:  02/15/24 116/60  12/14/23 118/62  11/19/23 120/64   Wt Readings from Last 3 Encounters:  11/19/23 153 lb 4.8 oz (69.5 kg)  11/05/23 151 lb 9.6 oz (68.8 kg)  09/14/23 154 lb (69.9 kg)   There is no height or weight on file to calculate BMI.    Physical Exam         Assessment & Plan:    See Problem List for Assessment and Plan of chronic medical problems.

## 2024-02-23 NOTE — Telephone Encounter (Signed)
 Patient called again to follow-up on next steps regarding her BP issue.  See previous note.

## 2024-02-23 NOTE — Telephone Encounter (Signed)
 Spoke with pt, who reported she has noticed an increase in her systolic blood pressure, ranging between 150-160. Pt stated that on Sunday she restarted amlodipine  and took 5 mg in the morning with no improvement, then took another dose later that evening. Pt reported she called the office yesterday morning but did not receive a call back. She later contacted the after-hours service and was advised to restart amlodipine .  Pt called this morning stating she was unsure why she was instructed to restart amlodipine  when she had already resumed it on Sunday. She reported that despite taking amlodipine , her BP remains elevated, with systolic readings of 160 last night and 159 this morning. Pt voiced that she cannot continue to feel this way and reported occasional dizziness and headache.  Will forward to MD for further recommendations

## 2024-02-24 ENCOUNTER — Ambulatory Visit: Admitting: Internal Medicine

## 2024-02-24 VITALS — BP 142/90 | HR 102 | Temp 97.8°F | Ht 60.0 in | Wt 152.4 lb

## 2024-02-24 DIAGNOSIS — I10 Essential (primary) hypertension: Secondary | ICD-10-CM

## 2024-02-24 DIAGNOSIS — Z95811 Presence of heart assist device: Secondary | ICD-10-CM

## 2024-02-24 DIAGNOSIS — K219 Gastro-esophageal reflux disease without esophagitis: Secondary | ICD-10-CM

## 2024-02-24 MED ORDER — LOSARTAN POTASSIUM 25 MG PO TABS: 25.0000 mg | ORAL_TABLET | Freq: Every day | ORAL | 3 refills | Status: AC

## 2024-02-24 NOTE — Assessment & Plan Note (Signed)
 Chronic Did describe some chest discomfort which she thinks is reflux She is taking pantoprazole  daily as needed and also takes over-the-counter antacid as needed CT CAC from last year reviewed and has minimal heart disease so chest pain is unlikely to be cardiac in nature Advised to treat the reflux, especially heading into the holiday season where she may be eating more

## 2024-02-24 NOTE — Telephone Encounter (Signed)
 Called patient to go over information. Patient states she saw her primary care provider today and they said to continue taking amlodipine  10 to let it get into her system and see how it works. She said she is going to do this at this time and then if her blood pressure is still running high, she will call back and then start the other medication.

## 2024-02-24 NOTE — Assessment & Plan Note (Signed)
 Chronic Blood pressure not controlled recently-unsure why Continue benazepril  40 mg daily and spironolactone  25 mg daily She did restart amlodipine  5 mg daily and will increase that to 7.5 mg daily Discussed with her to continue monitoring BP at home Discussed that at some point blood pressure may start to improve and she will not need the additional 2.5 mg of the amlodipine  we will need to likely discontinue that Has routine follow-up with me

## 2024-02-24 NOTE — Assessment & Plan Note (Signed)
 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

## 2024-02-24 NOTE — Addendum Note (Signed)
 Addended by: BRIEN SALM on: 02/24/2024 01:47 PM   Modules accepted: Orders

## 2024-02-24 NOTE — Patient Instructions (Addendum)
      Medications changes include :   increase amlodipine  to 7.5 mg daily for now   Monitor your BP closely --- if BP starts to go too low -- go back to 5 mg daily

## 2024-03-09 ENCOUNTER — Other Ambulatory Visit: Payer: Self-pay | Admitting: Internal Medicine

## 2024-03-14 ENCOUNTER — Ambulatory Visit: Payer: Medicare Other

## 2024-03-14 VITALS — Ht 60.0 in | Wt 152.0 lb

## 2024-03-14 DIAGNOSIS — Z0000 Encounter for general adult medical examination without abnormal findings: Secondary | ICD-10-CM

## 2024-03-14 NOTE — Patient Instructions (Signed)
 Ms. Sanluis,  Thank you for taking the time for your Medicare Wellness Visit. I appreciate your continued commitment to your health goals. Please review the care plan we discussed, and feel free to reach out if I can assist you further.  Please note that Annual Wellness Visits do not include a physical exam. Some assessments may be limited, especially if the visit was conducted virtually. If needed, we may recommend an in-person follow-up with your provider.  Ongoing Care Seeing your primary care provider every 3 to 6 months helps us  monitor your health and provide consistent, personalized care.   Referrals If a referral was made during today's visit and you haven't received any updates within two weeks, please contact the referred provider directly to check on the status.  Recommended Screenings:  Health Maintenance  Topic Date Due   Pneumococcal Vaccine for age over 38 (1 of 2 - PCV) Never done   Osteoporosis screening with Bone Density Scan  03/30/2024*   Flu Shot  06/28/2024*   DTaP/Tdap/Td vaccine (2 - Tdap) 09/13/2024*   Medicare Annual Wellness Visit  03/14/2025   Meningitis B Vaccine  Aged Out   Breast Cancer Screening  Discontinued   COVID-19 Vaccine  Discontinued   Zoster (Shingles) Vaccine  Discontinued  *Topic was postponed. The date shown is not the original due date.       03/14/2024    8:21 AM  Advanced Directives  Does Patient Have a Medical Advance Directive? Yes  Type of Advance Directive Living will  Does patient want to make changes to medical advance directive? Yes (Inpatient - patient requests chaplain consult to change a medical advance directive)    Vision: Annual vision screenings are recommended for early detection of glaucoma, cataracts, and diabetic retinopathy. These exams can also reveal signs of chronic conditions such as diabetes and high blood pressure.  Dental: Annual dental screenings help detect early signs of oral cancer, gum disease, and  other conditions linked to overall health, including heart disease and diabetes.

## 2024-03-14 NOTE — Progress Notes (Addendum)
 Chief Complaint  Patient presents with   Medicare Wellness     Subjective:   Bonnie Zhang is a 83 y.o. female who presents for a Medicare Annual Wellness Visit.  Visit info / Clinical Intake: Medicare Wellness Visit Type:: Subsequent Annual Wellness Visit Persons participating in visit and providing information:: patient Medicare Wellness Visit Mode:: Telephone If telephone:: video declined Since this visit was completed virtually, some vitals may be partially provided or unavailable. Missing vitals are due to the limitations of the virtual format.: Documented vitals are patient reported If Telephone or Video please confirm:: I connected with patient using audio/video enable telemedicine. I verified patient identity with two identifiers, discussed telehealth limitations, and patient agreed to proceed. Patient Location:: Home Provider Location:: Office Interpreter Needed?: No Pre-visit prep was completed: no AWV questionnaire completed by patient prior to visit?: no Living arrangements:: (!) lives alone Patient's Overall Health Status Rating: good Typical amount of pain: none Does pain affect daily life?: no Are you currently prescribed opioids?: no  Dietary Habits and Nutritional Risks How many meals a day?: 3 Eats fruit and vegetables daily?: yes Most meals are obtained by: preparing own meals; eating out In the last 2 weeks, have you had any of the following?: none Diabetic:: no  Functional Status Activities of Daily Living (to include ambulation/medication): Independent Ambulation: Independent with device- listed below Home Assistive Devices/Equipment: Eyeglasses (eyeglasses for reading only) Medication Administration: Independent Home Management (perform basic housework or laundry): Independent Manage your own finances?: yes Primary transportation is: driving Concerns about vision?: no *vision screening is required for WTM* Concerns about hearing?: no  Fall  Screening Falls in the past year?: 0 Number of falls in past year: 0 Was there an injury with Fall?: 0 Fall Risk Category Calculator: 0 Patient Fall Risk Level: Low Fall Risk  Fall Risk Patient at Risk for Falls Due to: No Fall Risks Fall risk Follow up: Falls evaluation completed; Falls prevention discussed  Home and Transportation Safety: All rugs have non-skid backing?: N/A, no rugs All stairs or steps have railings?: yes (outside) Grab bars in the bathtub or shower?: yes Have non-skid surface in bathtub or shower?: yes Good home lighting?: yes Regular seat belt use?: yes Hospital stays in the last year:: no  Cognitive Assessment Difficulty concentrating, remembering, or making decisions? : no Will 6CIT or Mini Cog be Completed: yes What year is it?: 0 points What month is it?: 0 points Give patient an address phrase to remember (5 components): [address] [place], [place] About what time is it?: 0 points Count backwards from 20 to 1: 0 points Say the months of the year in reverse: 0 points Repeat the address phrase from earlier: 0 points 6 CIT Score: 0 points  Advance Directives (For Healthcare) Does Patient Have a Medical Advance Directive?: Yes Does patient want to make changes to medical advance directive?: Yes (Inpatient - patient requests chaplain consult to change a medical advance directive) Type of Advance Directive: Living will Copy of Living Will in Chart?: No - copy requested  Reviewed/Updated  Reviewed/Updated: Reviewed All (Medical, Surgical, Family, Medications, Allergies, Care Teams, Patient Goals)    Allergies (verified) Myrbetriq  [mirabegron ], Vicodin [hydrocodone-acetaminophen ], Aspirin, Hctz [hydrochlorothiazide], and Lipitor [atorvastatin]   Current Medications (verified) Outpatient Encounter Medications as of 03/14/2024  Medication Sig   acetaminophen  (TYLENOL ) 500 MG tablet Take 500-1,000 mg by mouth every 6 (six) hours as needed for mild  pain.   amLODipine  (NORVASC ) 5 MG tablet Take 7.5 mg  by mouth daily.   benazepril  (LOTENSIN ) 40 MG tablet TAKE 1 TABLET BY MOUTH DAILY.   cholecalciferol  (VITAMIN D3) 25 MCG (1000 UNIT) tablet Take 2,000 Units by mouth daily.   losartan  (COZAAR ) 25 MG tablet Take 1 tablet (25 mg total) by mouth daily.   pantoprazole  (PROTONIX ) 40 MG tablet TAKE 1 TABLET BY MOUTH ONCE DAILY AS NEEDED FOR ACID REFLUX   simvastatin  (ZOCOR ) 10 MG tablet TAKE 1 TABLET BY MOUTH AT BEDTIME.   spironolactone  (ALDACTONE ) 25 MG tablet TAKE 1 TABLET BY MOUTH DAILY.   vitamin B-12 (CYANOCOBALAMIN ) 1000 MCG tablet Take 1,000 mcg by mouth daily.   warfarin (COUMADIN ) 5 MG tablet TAKE 1/2 TABLET BY MOUTH DAILY EXCEPT TAKE 1 TABLET ON MONDAYS AND FRIDAYS OR AS DIRECTED BY ANTICOAGULATION CLINIC   No facility-administered encounter medications on file as of 03/14/2024.    History: Past Medical History:  Diagnosis Date   Anticoagulated on Coumadin  2001   2 blood clots   Bruises easily    pt is on Coumadin ;last dose of Coumadin  08/05/11 and then Lovenox  started 08/07/11   DVT (deep vein thrombosis) in pregnancy     1965 post C section;2001 with prolonged driving while on  HRT   DVT (deep venous thrombosis) (HCC) 2001   Was on Prempro.     Endometrioid adenocarcinoma of uterus (HCC)    Gallstones    GERD (gastroesophageal reflux disease)    Protonix  prn   High cholesterol    takes Simvastating daily   History of colon polyps 2007   Dr Jakie   History of radiation therapy 09/06/2019-11/23/2019   IMRT Vagina/Pelvis and Vagina HDR 4 fx; Dr. Lynwood Nasuti   Homocysteinemia 01/23/2015   Hx of migraines    yrs ago    Hypertension    takes Benazepril ,Amlodipine ,and Metoprolol  daily   Osteoporosis    PONV (postoperative nausea and vomiting)    Primary localized osteoarthritis of left knee    PVD (peripheral vascular disease) 1965, 2001   abnormal venous doppler findings due to recurrent DVT'S left leg   Right knee  DJD    knees   Shortness of breath dyspnea    TIA (transient ischemic attack) 2020   Vitamin B12 deficiency (non anemic) 01/24/2015   Vitamin D  deficiency    takes VIt D daily   Past Surgical History:  Procedure Laterality Date   ABDOMINAL HYSTERECTOMY     APPENDECTOMY  04/01/1963   BREAST LUMPECTOMY  03/31/1985   CESAREAN SECTION  1961/1965/1966   X 3   CHOLECYSTECTOMY  04/01/1995   COLONOSCOPY W/ POLYPECTOMY  03/31/2005   Dr  Jakie; due? 2012   DILATION AND CURETTAGE OF UTERUS  03/31/2010   uterine polyp, Dr Fredirick   ESOPHAGOGASTRODUODENOSCOPY  04/01/1995   HYSTEROSCOPY WITH D & C N/A 08/05/2012   Procedure: DILATATION AND CURETTAGE /HYSTEROSCOPY;  Surgeon: Glenys GORMAN Fredirick, MD;  Location: [facility];  Service: Gynecology;  Laterality: N/A;   PACEMAKER IMPLANT N/A 05/03/2021   Procedure: PACEMAKER IMPLANT;  Surgeon: Waddell Danelle ORN, MD;  Location[facility] INVASIVE CV LAB;  Service: Cardiovascular;  Laterality: N/A;   ROBOTIC ASSISTED TOTAL HYSTERECTOMY WITH BILATERAL SALPINGO OOPHERECTOMY Right 09/14/2012   Procedure: ROBOTIC ASSISTED TOTAL HYSTERECTOMY WITH BILATERAL SALPINGO OOPHORECTOMY ,right pelvic LYMPHNODE dissection;  Surgeon: Elenore A. Dodie, MD;  Location: [facility];  Service: Gynecology;  Laterality: Right;   TOTAL KNEE ARTHROPLASTY  08/11/2011   Procedure: TOTAL KNEE ARTHROPLASTY;  Surgeon: Lamar DELENA Millman, MD;  Location: [facility] OR;  Service: Orthopedics;  Laterality: Right;  DR JANE CHRISTIAN 90 MINUTES FOR THIS CASE   TOTAL KNEE ARTHROPLASTY Left 09/11/2014   Procedure: TOTAL KNEE ARTHROPLASTY;  Surgeon: Lamar Jane, MD;  Location: [facility] OR;  Service: Orthopedics;  Laterality: Left;   WISDOM TOOTH EXTRACTION     Family History  Problem Relation Age of Onset   Kidney disease Mother        ? hypertensive   Hypertension Mother    Deep vein thrombosis Mother        post ankle fracture   Leukemia Father    Hypertension Sister    Liver disease Brother    Colon cancer Maternal  Grandmother    Heart failure Maternal Grandfather    Diabetes Other        cousins   Anesthesia problems Neg Hx    Hypotension Neg Hx    Malignant hyperthermia Neg Hx    Pseudochol deficiency Neg Hx    Stroke Neg Hx    Heart disease Neg Hx    Breast cancer Neg Hx    Ovarian cancer Neg Hx    Uterine cancer Neg Hx    Pancreatic cancer Neg Hx    Cancer - Prostate Neg Hx    Social History   Occupational History   Occupation: Retired  Tobacco Use   Smoking status: Never    Passive exposure: Never   Smokeless tobacco: Never  Vaping Use   Vaping status: Never Used  Substance and Sexual Activity   Alcohol use: No   Drug use: No   Sexual activity: Not Currently    Birth control/protection: Post-menopausal    Comment: retired. has 2 daughters   Tobacco Counseling Counseling given: Not Answered  SDOH Screenings   Food Insecurity: No Food Insecurity (03/14/2024)  Housing: Unknown (03/14/2024)  Transportation Needs: No Transportation Needs (03/14/2024)  Utilities: Not At Risk (03/14/2024)  Alcohol Screen: Low Risk (03/12/2023)  Depression (PHQ2-9): Low Risk (03/14/2024)  Financial Resource Strain: Low Risk (03/12/2023)  Physical Activity: Inactive (03/14/2024)  Social Connections: Moderately Integrated (03/14/2024)  Stress: No Stress Concern Present (03/14/2024)  Tobacco Use: Low Risk (03/14/2024)  Health Literacy: Adequate Health Literacy (03/14/2024)   See flowsheets for full screening details  Depression Screen PHQ 2 & 9 Depression Scale- Over the past 2 weeks, how often have you been bothered by any of the following problems? Little interest or pleasure in doing things: 0 Feeling down, depressed, or hopeless (PHQ Adolescent also includes...irritable): 0 PHQ-2 Total Score: 0 Trouble falling or staying asleep, or sleeping too much: 0 Feeling tired or having little energy: 0 Poor appetite or overeating (PHQ Adolescent also includes...weight loss): 0 Feeling bad about  yourself - or that you are a failure or have let yourself or your family down: 0 Trouble concentrating on things, such as reading the newspaper or watching television (PHQ Adolescent also includes...like school work): 0 Moving or speaking so slowly that other people could have noticed. Or the opposite - being so fidgety or restless that you have been moving around a lot more than usual: 0 Thoughts that you would be better off dead, or of hurting yourself in some way: 0 PHQ-9 Total Score: 0 If you checked off any problems, how difficult have these problems made it for you to do your work, take care of things at home, or get along with other people?: Not difficult at all  Depression Treatment Depression Interventions/Treatment : EYV7-0 Score <4 Follow-up Not Indicated  Goals Addressed               This Visit's Progress     Patient Stated (pt-stated)        Patient stated she plans to walk more             Objective:    Today's Vitals   03/14/24 0818  Weight: 152 lb (68.9 kg)  Height: 5' (1.524 m)   Body mass index is 29.69 kg/m.  Hearing/Vision screen Hearing Screening - Comments:: Denies hearing difficulties   Vision Screening - Comments:: Wears eyeglasses for reading - referral to [facility]  Immunizations and Health Maintenance Health Maintenance  Topic Date Due   Pneumococcal Vaccine: 50+ Years (1 of 2 - PCV) Never done   Bone Density Scan  03/30/2024 (Originally 01/03/2023)   Influenza Vaccine  06/28/2024 (Originally 10/30/2023)   DTaP/Tdap/Td (2 - Tdap) 09/13/2024 (Originally 03/21/2020)   Medicare Annual Wellness (AWV)  03/14/2025   Meningococcal B Vaccine  Aged Out   Mammogram  Discontinued   COVID-19 Vaccine  Discontinued   Zoster Vaccines- Shingrix  Discontinued        Assessment/Plan:  This is a routine wellness examination for [facility]  I have recommended that this patient have a immunization for Influenza and Pneumonia  but she  declines at this time. I have discussed the risks and benefits of this procedure with her. The patient verbalizes understanding.   Patient Care Team: Geofm Glade PARAS, MD as PCP - General (Internal Medicine) Darliss Rogue, MD as PCP - Cardiology (Cardiology) Cindie Ole DASEN, MD as PCP - Electrophysiology (Cardiology) Delford Maude BROCKS, MD as Consulting Physician (Cardiology) Lonn Hicks, MD as Consulting Physician (Hematology and Oncology) Jane Charleston, MD as Consulting Physician (Orthopedic Surgery) Skeet Juliene SAUNDERS, DO as Consulting Physician (Neurology) Fate Morna SAILOR, [facility] (Inactive) as Pharmacist (Pharmacist)  I have personally reviewed and noted the following in the patients chart:   Medical and social history Use of alcohol, tobacco or illicit drugs  Current medications and supplements including opioid prescriptions. Functional ability and status Nutritional status Physical activity Advanced directives List of other physicians Hospitalizations, surgeries, and ER visits in previous 12 months Vitals Screenings to include cognitive, depression, and falls Referrals and appointments  No orders of the defined types were placed in this encounter.  In addition, I have reviewed and discussed with patient certain preventive protocols, quality metrics, and best practice recommendations. A written personalized care plan for preventive services as well as general preventive health recommendations were provided to patient.   Verdie CHRISTELLA Saba, CMA   03/14/2024   Return in 1 year (on 03/14/2025).  After Visit Summary: (Declined) Due to this being a telephonic visit, with patients personalized plan was offered to patient but patient Declined AVS at this time   Nurse Notes: scheduled 2026 AWV/CPE appts

## 2024-03-15 ENCOUNTER — Encounter: Payer: Self-pay | Admitting: Internal Medicine

## 2024-03-15 NOTE — Progress Notes (Unsigned)
 Subjective:    Patient ID: Bonnie Zhang, female    DOB: Nov 24, 1940, 83 y.o.   MRN: [id number]     HPI Bonnie Zhang is here for follow up of her chronic medical problems.  Feeling good.  No concerns.    Medications and allergies reviewed with patient and updated if appropriate.  Medications Ordered Prior to Encounter[1]   Review of Systems  Constitutional:  Negative for fever.  Respiratory:  Negative for cough, shortness of breath and wheezing.   Cardiovascular:  Positive for palpitations (occ - chronic). Negative for chest pain and leg swelling.  Neurological:  Negative for light-headedness and headaches.       Objective:   Vitals:   03/16/24 1019  BP: 120/78  Pulse: 83  Temp: 98.9 F (37.2 C)  SpO2: 98%   BP Readings from Last 3 Encounters:  03/16/24 120/78  02/24/24 (!) 142/90  02/15/24 116/60   Wt Readings from Last 3 Encounters:  03/16/24 156 lb (70.8 kg)  03/14/24 152 lb (68.9 kg)  02/24/24 152 lb 6 oz (69.1 kg)   Body mass index is 29.48 kg/m.    Physical Exam Constitutional:      General: She is not in acute distress.    Appearance: Normal appearance.  HENT:     Head: Normocephalic and atraumatic.  Eyes:     Conjunctiva/sclera: Conjunctivae normal.  Cardiovascular:     Rate and Rhythm: Normal rate and regular rhythm.     Heart sounds: Normal heart sounds.  Pulmonary:     Effort: Pulmonary effort is normal. No respiratory distress.     Breath sounds: Normal breath sounds. No wheezing.  Musculoskeletal:     Cervical back: Neck supple.     Right lower leg: No edema.     Left lower leg: No edema.  Lymphadenopathy:     Cervical: No cervical adenopathy.  Skin:    General: Skin is warm and dry.     Findings: No rash.  Neurological:     Mental Status: She is alert. Mental status is at baseline.  Psychiatric:        Mood and Affect: Mood normal.        Behavior: Behavior normal.        Lab Results  Component Value Date   WBC  4.9 09/14/2023   HGB 12.3 09/14/2023   HCT 38.0 09/14/2023   PLT 189 09/14/2023   GLUCOSE 98 09/14/2023   CHOL 151 09/14/2023   TRIG 129 09/14/2023   HDL 70 09/14/2023   LDLCALC 60 09/14/2023   ALT 8 09/14/2023   AST 15 09/14/2023   NA 141 09/14/2023   K 4.3 09/14/2023   CL 106 09/14/2023   CREATININE 1.04 (H) 09/14/2023   BUN 15 09/14/2023   CO2 24 09/14/2023   TSH 0.90 09/14/2023   INR 2.2 02/12/2024   HGBA1C 5.6 09/14/2023     Assessment & Plan:    See Problem List for Assessment and Plan of chronic medical problems.       [1]  Current Outpatient Medications on File Prior to Visit  Medication Sig Dispense Refill   acetaminophen  (TYLENOL ) 500 MG tablet Take 500-1,000 mg by mouth every 6 (six) hours as needed for mild pain.     amLODipine  (NORVASC ) 5 MG tablet Take 5 mg by mouth daily.     benazepril  (LOTENSIN ) 40 MG tablet TAKE 1 TABLET BY MOUTH DAILY. 90 tablet 1   cholecalciferol  (VITAMIN D3) 25  MCG (1000 UNIT) tablet Take 2,000 Units by mouth daily.     losartan  (COZAAR ) 25 MG tablet Take 1 tablet (25 mg total) by mouth daily. 30 tablet 3   pantoprazole  (PROTONIX ) 40 MG tablet TAKE 1 TABLET BY MOUTH ONCE DAILY AS NEEDED FOR ACID REFLUX 90 tablet 0   simvastatin  (ZOCOR ) 10 MG tablet TAKE 1 TABLET BY MOUTH AT BEDTIME. 90 tablet 1   spironolactone  (ALDACTONE ) 25 MG tablet TAKE 1 TABLET BY MOUTH DAILY. 90 tablet 2   vitamin B-12 (CYANOCOBALAMIN ) 1000 MCG tablet Take 1,000 mcg by mouth daily.     warfarin (COUMADIN ) 5 MG tablet TAKE 1/2 TABLET BY MOUTH DAILY EXCEPT TAKE 1 TABLET ON MONDAYS AND FRIDAYS OR AS DIRECTED BY ANTICOAGULATION CLINIC 90 tablet 1   No current facility-administered medications on file prior to visit.

## 2024-03-15 NOTE — Patient Instructions (Addendum)
° ° ° ° °  Blood work was ordered.       Medications changes include :   None    A referral was ordered and someone will call you to schedule an appointment.     Return in about 6 months (around 09/14/2024) for Physical Exam.

## 2024-03-16 ENCOUNTER — Ambulatory Visit: Payer: Self-pay | Admitting: Internal Medicine

## 2024-03-16 ENCOUNTER — Ambulatory Visit: Admitting: Internal Medicine

## 2024-03-16 VITALS — BP 120/78 | HR 83 | Temp 98.9°F | Ht 61.0 in | Wt 156.0 lb

## 2024-03-16 DIAGNOSIS — R7983 Abnormal findings of blood amino-acid level: Secondary | ICD-10-CM

## 2024-03-16 DIAGNOSIS — K219 Gastro-esophageal reflux disease without esophagitis: Secondary | ICD-10-CM | POA: Diagnosis not present

## 2024-03-16 DIAGNOSIS — I10 Essential (primary) hypertension: Secondary | ICD-10-CM | POA: Diagnosis not present

## 2024-03-16 DIAGNOSIS — E559 Vitamin D deficiency, unspecified: Secondary | ICD-10-CM

## 2024-03-16 DIAGNOSIS — Z8542 Personal history of malignant neoplasm of other parts of uterus: Secondary | ICD-10-CM | POA: Diagnosis not present

## 2024-03-16 DIAGNOSIS — N1832 Chronic kidney disease, stage 3b: Secondary | ICD-10-CM

## 2024-03-16 DIAGNOSIS — R739 Hyperglycemia, unspecified: Secondary | ICD-10-CM | POA: Diagnosis not present

## 2024-03-16 DIAGNOSIS — D6851 Activated protein C resistance: Secondary | ICD-10-CM | POA: Diagnosis not present

## 2024-03-16 DIAGNOSIS — E538 Deficiency of other specified B group vitamins: Secondary | ICD-10-CM

## 2024-03-16 DIAGNOSIS — Z86718 Personal history of other venous thrombosis and embolism: Secondary | ICD-10-CM | POA: Diagnosis not present

## 2024-03-16 DIAGNOSIS — E7800 Pure hypercholesterolemia, unspecified: Secondary | ICD-10-CM

## 2024-03-16 LAB — CBC
HCT: 39.7 % (ref 36.0–46.0)
Hemoglobin: 13.5 g/dL (ref 12.0–15.0)
MCHC: 34.1 g/dL (ref 30.0–36.0)
MCV: 88.9 fl (ref 78.0–100.0)
Platelets: 196 K/uL (ref 150.0–400.0)
RBC: 4.47 Mil/uL (ref 3.87–5.11)
RDW: 12.9 % (ref 11.5–15.5)
WBC: 6.4 K/uL (ref 4.0–10.5)

## 2024-03-16 LAB — COMPREHENSIVE METABOLIC PANEL WITH GFR
ALT: 9 U/L (ref 3–35)
AST: 18 U/L (ref 5–37)
Albumin: 4.6 g/dL (ref 3.5–5.2)
Alkaline Phosphatase: 84 U/L (ref 39–117)
BUN: 16 mg/dL (ref 6–23)
CO2: 27 meq/L (ref 19–32)
Calcium: 10 mg/dL (ref 8.4–10.5)
Chloride: 102 meq/L (ref 96–112)
Creatinine, Ser: 1.12 mg/dL (ref 0.40–1.20)
GFR: 45.78 mL/min — ABNORMAL LOW (ref >60.00)
Glucose, Bld: 100 mg/dL — ABNORMAL HIGH (ref 70–99)
Potassium: 4.1 meq/L (ref 3.5–5.1)
Sodium: 139 meq/L (ref 135–145)
Total Bilirubin: 0.7 mg/dL (ref 0.2–1.2)
Total Protein: 7.9 g/dL (ref 6.0–8.3)

## 2024-03-16 LAB — LIPID PANEL
Cholesterol: 154 mg/dL (ref 28–200)
HDL: 75.5 mg/dL (ref >39.00)
LDL Cholesterol: 55 mg/dL (ref 10–99)
NonHDL: 78.36
Total CHOL/HDL Ratio: 2
Triglycerides: 119 mg/dL (ref 10.0–149.0)
VLDL: 23.8 mg/dL (ref 0.0–40.0)

## 2024-03-16 LAB — HEMOGLOBIN A1C: Hgb A1c MFr Bld: 5.4 % (ref 4.6–6.5)

## 2024-03-16 LAB — VITAMIN B12: Vitamin B-12: 581 pg/mL (ref 211–911)

## 2024-03-16 LAB — VITAMIN D 25 HYDROXY (VIT D DEFICIENCY, FRACTURES): VITD: 31.9 ng/mL (ref 30.00–100.00)

## 2024-03-16 NOTE — Assessment & Plan Note (Addendum)
 Chronic Blood pressure controlled Decreased amlodipine  back to 5mg  and BP has been controlled Continue amlodipine  5 mg daily, benazepril  40 mg daily and spironolactone  25 mg daily Monitor BP at home CBC, CMP

## 2024-03-16 NOTE — Assessment & Plan Note (Signed)
 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

## 2024-03-16 NOTE — Assessment & Plan Note (Signed)
 Chronic Taking vitamin D daily Check vitamin D level

## 2024-03-16 NOTE — Assessment & Plan Note (Signed)
 Chronic  Check  B12 level, homocysteine level

## 2024-03-16 NOTE — Assessment & Plan Note (Signed)
Chronic Taking B12 supplementation Check B12 level

## 2024-03-16 NOTE — Assessment & Plan Note (Signed)
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 

## 2024-03-16 NOTE — Assessment & Plan Note (Signed)
 Chronic BP well controlled Sugars - normal Increased water  intake, avoid nsaids Cmp, cbc

## 2024-03-16 NOTE — Assessment & Plan Note (Signed)
Chronic Regular exercise and healthy diet encouraged Check lipid panel, cmp Continue simvastatin 10 mg nightly

## 2024-03-16 NOTE — Assessment & Plan Note (Signed)
 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

## 2024-03-16 NOTE — Assessment & Plan Note (Signed)
 Chronic Controlled Continue pantoprazole  40 mg daily  Continue Pepcid 20 mg daily as needed or Tums as needed

## 2024-03-16 NOTE — Assessment & Plan Note (Addendum)
 Chronic Sugars have been elevated at times Check A1c

## 2024-03-18 ENCOUNTER — Ambulatory Visit

## 2024-03-18 DIAGNOSIS — Z7901 Long term (current) use of anticoagulants: Secondary | ICD-10-CM

## 2024-03-18 LAB — HOMOCYSTEINE: Homocysteine: 14.7 umol/L — ABNORMAL HIGH

## 2024-03-18 LAB — POCT INR: INR: 2.7 (ref 2.0–3.0)

## 2024-03-18 NOTE — Patient Instructions (Addendum)
 Pre visit review using our clinic review tool, if applicable. No additional management support is needed unless otherwise documented below in the visit note.  Continue 1/2 tablet daily except take 1 tablet on Monday and Friday. Recheck in 2 weeks.

## 2024-03-18 NOTE — Progress Notes (Signed)
 Indication: DVT, Factor V Leiden Pt diagnosed with skin cancer on her arm. She will have Mohs surgery on 1/2. She will come in on 12/30 for INR check per dermatology request. No hold on warfarin was requested. Continue 1/2 tablet daily except take 1 tablet on Monday and Friday. Recheck in 2 weeks.

## 2024-03-29 ENCOUNTER — Ambulatory Visit (INDEPENDENT_AMBULATORY_CARE_PROVIDER_SITE_OTHER)

## 2024-03-29 DIAGNOSIS — Z7901 Long term (current) use of anticoagulants: Secondary | ICD-10-CM | POA: Diagnosis not present

## 2024-03-29 LAB — POCT INR: INR: 2.7 (ref 2.0–3.0)

## 2024-03-29 NOTE — Patient Instructions (Addendum)
 Pre visit review using our clinic review tool, if applicable. No additional management support is needed unless otherwise documented below in the visit note.  Continue 1/2 tablet daily except take 1 tablet on Monday and Friday. Recheck in 6 weeks.

## 2024-03-29 NOTE — Progress Notes (Signed)
 Indication: DVT, Factor V Leiden Pt diagnosed with skin cancer on her arm. She will have Mohs surgery on 1/2. She will have INR check today per dermatology request. No hold on warfarin was requested. Continue 1/2 tablet daily except take 1 tablet on Monday and Friday. Recheck in 6 weeks.

## 2024-04-01 ENCOUNTER — Encounter: Payer: Self-pay | Admitting: Dermatology

## 2024-04-01 ENCOUNTER — Ambulatory Visit: Admitting: Dermatology

## 2024-04-01 VITALS — BP 137/64 | HR 86 | Temp 98.0°F

## 2024-04-01 DIAGNOSIS — C4491 Basal cell carcinoma of skin, unspecified: Secondary | ICD-10-CM

## 2024-04-01 DIAGNOSIS — L814 Other melanin hyperpigmentation: Secondary | ICD-10-CM | POA: Diagnosis not present

## 2024-04-01 DIAGNOSIS — C44619 Basal cell carcinoma of skin of left upper limb, including shoulder: Secondary | ICD-10-CM | POA: Diagnosis not present

## 2024-04-01 DIAGNOSIS — L578 Other skin changes due to chronic exposure to nonionizing radiation: Secondary | ICD-10-CM

## 2024-04-01 NOTE — Progress Notes (Signed)
 "  Follow-Up Visit   Subjective  Bonnie Zhang is a 84 y.o. female who presents for the following: Excision of a Basal Cell Carcinoma on the left forearm. 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  The following portions of the chart were reviewed this encounter and updated as appropriate: medications, allergies, medical history  Review of Systems:  No other skin or systemic complaints except as noted in HPI or Assessment and Plan.  Objective  Well appearing patient in no apparent distress; mood and affect are within normal limits.  A focused examination was performed of the following areas: Left forearm Relevant physical exam findings are noted in the Assessment and Plan.   Left Forearm Linear scar   Assessment & Plan   BASAL CELL CARCINOMA (BCC), UNSPECIFIED SITE Left Forearm - Mohs surgery   Universal Protocol: Procedure explained and questions answered to patient or proxy's satisfaction: Yes   Test results available and properly labeled: Yes   Pathology report reviewed: Yes   External notes reviewed: Yes   Site/side marked: Yes   Slide independently reviewed by Mohs surgeon: Yes    Anticoagulation: Is the patient taking prescription anticoagulant and/or aspirin prescribed/recommended by a physician? Yes   Was the anticoagulation regimen changed prior to Mohs? No    Anesthesia: Anesthesia method: local infiltration Local anesthetic: lidocaine  1% WITH epi  Procedure Details: Timeout: pre-procedure verification complete Procedure Prep: patient was prepped and draped in usual sterile fashion Prep type: chlorhexidine  Biopsy accession number: [id number] Biopsy lab: [facility] Date of biopsy: 02/15/2024 Frozen section biopsy performed: No   Specimen debulked: No   Pre-Op diagnosis: basal cell carcinoma Other BCC subtype: infundibulocystic MohsAIQ Surgical site (if  tumor spans multiple areas, please select predominant area): upper extremity Surgery side: left Surgical site (from skin exam): Left Forearm Pre-operative length (cm): 5 Pre-operative width (cm): 1.1 Indications for Mohs surgery: anatomic location where tissue conservation is critical, tumor size greater than 2 cm and positive margins after excision Previously treated? Yes   Previous treatment type: excision  Micrographic Surgery Details: Post-operative length (cm): 6 Post-operative width (cm): 6.5 Number of Mohs stages: 1 Post surgery depth of defect: subcutaneous fat  Stage 1    Tumor features identified on Mohs section: no tumor identified  Patient tolerance of procedure: tolerated well, no immediate complications  Reconstruction: Was the defect reconstructed? Yes   Was reconstruction performed by the same Mohs surgeon? Yes   Setting of reconstruction: outpatient office When was reconstruction performed? same day Type of reconstruction: linear Linear reconstruction: complex  Opioids: Did the patient receive a prescription for opioid/narcotic related to Mohs surgery?: No    Antibiotics: Does patient meet AHA guidelines for endocarditis?: No   Does patient meet AHA guidelines for orthopedic prophylaxis?: No   Were antibiotics given on the day of surgery?: No   Did surgery breach mucosa, expose cartilage/bone, involve an area of lymphedema/inflamed/infected tissue? No    - Skin repair Complexity:  Complex Final length (cm):  9 Informed consent: discussed and consent obtained   Timeout: patient name, date of birth, surgical site, and procedure verified   Procedure prep:  Patient was prepped and draped in usual sterile fashion Prep type:  Chlorhexidine  Anesthesia: the lesion was anesthetized in a standard fashion   Anesthetic:  1% lidocaine  w/ epinephrine  1-100,000 buffered w/ 8.4% NaHCO3 (15cc) Reason for type of repair: reduce the risk of dehiscence, infection, and  necrosis, allow closure of the large defect, preserve normal anatomy and preserve normal anatomical and functional relationships   Undermining: area extensively undermined   Subcutaneous layers (deep stitches):  Suture size:  3-0 Suture type: Vicryl (polyglactin 910)   Stitches:  Buried vertical mattress Fine/surface layer approximation (top stitches):  Suture type: cyanoacrylate tissue glue   Hemostasis achieved with: suture, pressure and electrodesiccation Outcome: patient tolerated procedure well with no complications   Post-procedure details: sterile dressing applied and wound care instructions given   Dressing type: pressure dressing (Steri Strips)      Return in about 1 month (around 05/02/2024) for Follow Up.  I, Rollene Gobble, RN, am acting as scribe for RUFUS CHRISTELLA HOLY, MD .   04/01/2024  HISTORY OF PRESENT ILLNESS  Bonnie Zhang is seen in consultation at the request of Dr. Adetokunbo Mccadden for biopsy-proven Infundibulocystic Basal Cell Carcinoma. They note that the area has been present for about 6 months increasing in size with time.  Previously excised as subcutaneous nodule, thought to be a cyst. Reports no other new or changing lesions and has no other complaints today.  Medications and allergies: see patient chart.  Review of systems: Reviewed 8 systems and notable for the above skin cancer.  All other systems reviewed are unremarkable/negative, unless noted in the HPI. Past medical history, surgical history, family history, social history were also reviewed and are noted in the chart/questionnaire.    PHYSICAL EXAMINATION  General: Well-appearing, in no acute distress, alert and oriented x 4. Vitals reviewed in chart (if available).   Skin: Exam reveals a 5.0 x 1.1 cm erythematous papule and biopsy scar on the left forearm. There are rhytids, telangiectasias, and lentigines, consistent with photodamage.   Biopsy report(s) reviewed, confirming the  diagnosis.   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 Due to location, size, histology, or recurrence and the likelihood of subclinical extension as well as the need to conserve normal surrounding tissue, the patient was deemed acceptable for Mohs micrographic surgery (MMS).  The nature and purpose of the procedure, associated benefits and risks including recurrence and scarring, possible complications such as pain, infection, and bleeding, and alternative methods of treatment if appropriate were discussed with the patient during consent. The lesion location was verified by the patient, by reviewing previous notes, pathology reports, and by photographs as well as angulation measurements if available.  Informed consent was reviewed and signed by the patient, and timeout was performed at 8:15 AM. See op note below.  2. For the photodamage and solar lentigines, sun protection discussed/information given on OTC sunscreens, and we recommend continued regular follow-up with primary dermatologist every 6 months or sooner for any growing, bleeding, or changing lesions. 3. Prognosis and future surveillance discussed. 4. Letter with treatment outcome sent to referring provider. 5. Pain acetaminophen /ibuprofen/oxycodone  5 mg  MOHS MICROGRAPHIC SURGERY AND RECONSTRUCTION  Initial size:   5.0 x 1.1 cm Surgical defect/wound size: 6.0 x 6.5 cm Anesthesia:    0.33% lidocaine  with 1:200,000 epinephrine  EBL:    <5 mL Complications:  None Repair type:   Complex SQ suture:   3-0 Vicryl Cutaneous suture:  Cyanoacrylate and Steristrips Final size of the repair: 9.0 cm  Stages: 1  STAGE I: Anesthesia achieved with 0.5% lidocaine  with 1:200,000 epinephrine . ChloraPrep applied. 4 section(s) excised using Mohs technique (this includes total peripheral and deep tissue margin excision and evaluation with frozen sections,  excised and  interpreted by the same physician). The tumor was first debulked and then excised with an approx. 2mm margin.  Hemostasis was achieved with electrocautery as needed.  The specimen was then oriented, subdivided/relaxed, inked, and processed using Mohs technique.    Frozen section analysis revealed a clear deep and peripheral margin.   Reconstruction  The surgical wound was then cleaned, prepped, and re-anesthetized as above. Wound edges were undermined extensively along at least one entire edge and at a distance equal to or greater than the width of the defect (see wound defect size above) in order to achieve closure and decrease wound tension and anatomic distortion. Redundant tissue repair including standing cone removal was performed. Hemostasis was achieved with electrocautery. Subcutaneous and epidermal tissues were approximated with the above sutures. The surgical site was then lightly scrubbed with sterile, saline-soaked gauze. Steri-strips were applied, and the area was then bandaged using Vaseline ointment, non-adherent gauze, gauze pads, and tape to provide an adequate pressure dressing. The patient tolerated the procedure well, was given detailed written and verbal wound care instructions, and was discharged in good condition.   The patient will follow-up: 4 weeks.    Documentation: I have reviewed the above documentation for accuracy and completeness, and I agree with the above.  RUFUS CHRISTELLA HOLY, MD "

## 2024-04-01 NOTE — Patient Instructions (Signed)
 Steri-Strip Wound Care Instructions Keep the bandage clean, dry, and in place for 48 hours. (72 hours if you take blood thinners). After removing the bandage: Leave the Steri-Strips in place Do not pull, tug, or rub the Steri-Strips You may shower with the Steri-Strips in place Gently clean the area with mild soap and water  Pat the area dry with a clean towel or cloth Do not remove the Steri-Strips. They will fall off on their own, usually within 10-14 days.  IN THE EVENT OF AN AFTER HOURS EMERGENCY (PAIN, BLEEDING, INFECTION), PLEASE CALL OUR OFFICE AND YOU BE DIRECTED TO THE ON CALL STAFF. MYCHART MESSAGES SENT AFTER HOURS WILL BE ADDRESSED ON THE NEXT BUSINESS DAY   What to Watch For After Surgery Bleeding Some light bleeding is normal in the first 24 hours. To lower bleeding risk: Do not lift more than 10 pounds for 1 week If surgery was on your face, head, or neck, do not bend or stoop for 72 hours If surgery was on an arm or leg, keep it raised as much as possible (Limit standing and walking if it was on the leg) If bleeding happens: Press firmly on the area for 30 minutes without looking If bleeding continues, press again for another 30 minutes Call the office if bleeding does not stop  Swelling and Bruising Swelling and bruising are normal. Use an ice pack for 15 minutes each hour during the first 1-2 days Wrap ice in a towel to keep bandages dry Keep the area raised when possible Use extra pillows if surgery was on the head or neck Raise arms or legs near heart level if surgery was there  Infection (Uncommon) Call the office if you notice: Increasing pain Redness or swelling Yellow drainage (pus) Symptoms starting several days after surgery  Pain Control Some pain is normal. Rest, ice, and elevation help You may take Tylenol  (acetaminophen ) and Ibuprofen (Advil/Motrin) Do not take aspirin for pain If you already take daily aspirin, continue it, but do not add  extra Safe option (if allowed): Take 2 ibuprofen (200 mg each) 3 hours later take 2 Tylenol  (325 mg each) Keep alternating every 3 hours as needed If you cannot take ibuprofen: Take Tylenol  650 mg every 4-6 hours EXAMPLE: Time Medicine Dose  6:00 AM Ibuprofen 400 mg (2 tablets of 200 mg)  9:00 AM Tylenol  650 mg (2 tablets of 325 mg)  12:00 PM Ibuprofen 400 mg  3:00 PM Tylenol  650 mg  6:00 PM Ibuprofen 400 mg  9:00 PM Tylenol  650 mg    Healing and Scar Questions When will my scar fade? It takes about 1 year for the scar to mature. Redness usually fades over time. Can my wound open? Yes, the area is weak for the first 6 months. Avoid pulling or stretching it. When will feeling return? Numbness or tingling can last 1-2 years. Some numbness may be permanent. Nose stuffiness If surgery was on your nose, stuffiness can last several months and will slowly improve. When can I stop wound care? You may stop once the skin is fully healed with no open areas. Makeup and sunscreen You may use makeup and sunscreen once: Stitches are removed, and The wound is fully healed Use sunscreen daily on healed skin.  Lumps, Puffiness, and Massage Small lumps under the skin are normal and will go away A small pimple along the scar can happen Use warm compresses Call if it does not improve Puffy scars can sometimes be treated -- call  the office if concerned After 1 month, gently massage the scar 2-3 times a day  Scar Products No Scar products should be applied until 1 month after your surgery Scar creams are optional Plain Vaseline works very well and is safe Stop any product that causes irritation  Future Skin Checks You may develop skin cancer again. See your dermatologist every 6-12 months Regular skin checks help find problems early  Important Information  Due to recent changes in healthcare laws, you may see results of your pathology and/or laboratory studies on MyChart before the  doctors have had a chance to review them. We understand that in some cases there may be results that are confusing or concerning to you. Please understand that not all results are received at the same time and often the doctors may need to interpret multiple results in order to provide you with the best plan of care or course of treatment. Therefore, we ask that you please give us  2 business days to thoroughly review all your results before contacting the office for clarification. Should we see a critical lab result, you will be contacted sooner.   If You Need Anything After Your Visit  If you have any questions or concerns for your doctor, please call our main line at [phone] If no one answers, please leave a voicemail as directed and we will return your call as soon as possible. Messages left after 4 pm will be answered the following business day.   You may also send us  a message via MyChart. We typically respond to MyChart messages within 1-2 business days.  For prescription refills, please ask your pharmacy to contact our office. Our fax number is [phone].  If you have an urgent issue when the clinic is closed that cannot wait until the next business day, you can page your doctor at the number below.    Please note that while we do our best to be available for urgent issues outside of office hours, we are not available 24/7.   If you have an urgent issue and are unable to reach us , you may choose to seek medical care at your doctor's office, retail clinic, urgent care center, or emergency room.  If you have a medical emergency, please immediately call 911 or go to the emergency department. In the event of inclement weather, please call our main line at [phone] for an update on the status of any delays or closures.  Dermatology Medication Tips: Please keep the boxes that topical medications come in in order to help keep track of the instructions about where and how to use  these. Pharmacies typically print the medication instructions only on the boxes and not directly on the medication tubes.   If your medication is too expensive, please contact our office at [phone] or send us  a message through MyChart.   We are unable to tell what your co-pay for medications will be in advance as this is different depending on your insurance coverage. However, we may be able to find a substitute medication at lower cost or fill out paperwork to get insurance to cover a needed medication.   If a prior authorization is required to get your medication covered by your insurance company, please allow us  1-2 business days to complete this process.  Drug prices often vary depending on where the prescription is filled and some pharmacies may offer cheaper prices.  The website www.goodrx.com contains coupons for medications through different pharmacies. The prices here do not  account for what the cost may be with help from insurance (it may be cheaper with your insurance), but the website can give you the price if you did not use any insurance.  - You can print the associated coupon and take it with your prescription to the pharmacy.  - You may also stop by our office during regular business hours and pick up a GoodRx coupon card.  - If you need your prescription sent electronically to a different pharmacy, notify our office through [facility] or by phone at [phone]

## 2024-05-02 ENCOUNTER — Ambulatory Visit: Payer: Medicare Other

## 2024-05-02 DIAGNOSIS — I441 Atrioventricular block, second degree: Secondary | ICD-10-CM

## 2024-05-03 ENCOUNTER — Ambulatory Visit: Admitting: Dermatology

## 2024-05-03 LAB — CUP PACEART REMOTE DEVICE CHECK
Battery Voltage: 75
Date Time Interrogation Session: 20260202111142
Implantable Lead Connection Status: 753985
Implantable Lead Connection Status: 753985
Implantable Lead Implant Date: 20230203
Implantable Lead Implant Date: 20230203
Implantable Lead Location: 753859
Implantable Lead Location: 753860
Implantable Lead Model: 377171
Implantable Lead Model: 377171
Implantable Lead Serial Number: 8000600801
Implantable Lead Serial Number: 8000640919
Implantable Pulse Generator Implant Date: 20230203
Pulse Gen Model: 407145
Pulse Gen Serial Number: 70290684

## 2024-05-06 NOTE — Progress Notes (Signed)
 Remote PPM Transmission

## 2024-05-10 ENCOUNTER — Ambulatory Visit

## 2024-05-20 ENCOUNTER — Ambulatory Visit: Admitting: Gynecologic Oncology

## 2024-05-23 ENCOUNTER — Ambulatory Visit: Admitting: Cardiology

## 2024-05-24 ENCOUNTER — Ambulatory Visit: Admitting: Dermatology

## 2024-08-02 ENCOUNTER — Ambulatory Visit

## 2024-09-15 ENCOUNTER — Ambulatory Visit: Admitting: Internal Medicine

## 2024-11-01 ENCOUNTER — Ambulatory Visit

## 2024-11-07 ENCOUNTER — Ambulatory Visit: Payer: Self-pay | Admitting: Radiation Oncology

## 2025-01-31 ENCOUNTER — Ambulatory Visit

## 2025-03-17 ENCOUNTER — Encounter: Admitting: Internal Medicine

## 2025-03-17 ENCOUNTER — Ambulatory Visit

## 2025-05-02 ENCOUNTER — Ambulatory Visit

## 2025-08-01 ENCOUNTER — Ambulatory Visit
